# Patient Record
Sex: Female | Born: 1955
Health system: Southern US, Community
[De-identification: ages and names within clinical notes are randomized; demographics above are authoritative.]

## PROBLEM LIST (undated history)

## (undated) DIAGNOSIS — J189 Pneumonia, unspecified organism: Secondary | ICD-10-CM

## (undated) DIAGNOSIS — A310 Pulmonary mycobacterial infection: Secondary | ICD-10-CM

## (undated) DIAGNOSIS — R0600 Dyspnea, unspecified: Secondary | ICD-10-CM

## (undated) DIAGNOSIS — C3490 Malignant neoplasm of unspecified part of unspecified bronchus or lung: Secondary | ICD-10-CM

## (undated) DIAGNOSIS — C3431 Malignant neoplasm of lower lobe, right bronchus or lung: Secondary | ICD-10-CM

## (undated) DIAGNOSIS — J449 Chronic obstructive pulmonary disease, unspecified: Secondary | ICD-10-CM

## (undated) HISTORY — DX: Malignant neoplasm of unspecified part of unspecified bronchus or lung: C34.90

## (undated) HISTORY — PX: ABDOMINAL HYSTERECTOMY: SHX81

## (undated) HISTORY — DX: Malignant neoplasm of lower lobe, right bronchus or lung: C34.31

## (undated) HISTORY — DX: Pulmonary mycobacterial infection: A31.0

---

## 1993-10-02 HISTORY — PX: ELBOW SURGERY: SHX618

## 2005-01-17 ENCOUNTER — Ambulatory Visit: Payer: Self-pay | Admitting: Family Medicine

## 2005-03-03 ENCOUNTER — Ambulatory Visit: Payer: Self-pay | Admitting: Family Medicine

## 2005-03-16 ENCOUNTER — Ambulatory Visit: Payer: Self-pay

## 2005-10-05 ENCOUNTER — Ambulatory Visit: Payer: Self-pay

## 2009-06-11 ENCOUNTER — Ambulatory Visit: Payer: Self-pay | Admitting: Family Medicine

## 2009-08-13 ENCOUNTER — Ambulatory Visit: Payer: Self-pay | Admitting: Gastroenterology

## 2009-09-08 ENCOUNTER — Ambulatory Visit: Payer: Self-pay | Admitting: Gastroenterology

## 2010-06-24 ENCOUNTER — Ambulatory Visit: Payer: Self-pay | Admitting: Family Medicine

## 2011-09-29 ENCOUNTER — Ambulatory Visit: Payer: Self-pay | Admitting: Family Medicine

## 2012-11-22 ENCOUNTER — Ambulatory Visit: Payer: Self-pay | Admitting: Gastroenterology

## 2012-11-25 LAB — PATHOLOGY REPORT

## 2013-01-07 ENCOUNTER — Ambulatory Visit: Payer: Self-pay | Admitting: Family Medicine

## 2013-07-12 ENCOUNTER — Ambulatory Visit: Payer: Self-pay | Admitting: Unknown Physician Specialty

## 2014-05-05 ENCOUNTER — Ambulatory Visit: Payer: Self-pay | Admitting: Family Medicine

## 2014-07-13 DIAGNOSIS — G5600 Carpal tunnel syndrome, unspecified upper limb: Secondary | ICD-10-CM | POA: Insufficient documentation

## 2014-07-13 DIAGNOSIS — G5602 Carpal tunnel syndrome, left upper limb: Secondary | ICD-10-CM | POA: Insufficient documentation

## 2014-07-13 DIAGNOSIS — M4802 Spinal stenosis, cervical region: Secondary | ICD-10-CM | POA: Insufficient documentation

## 2014-07-13 DIAGNOSIS — M5412 Radiculopathy, cervical region: Secondary | ICD-10-CM | POA: Insufficient documentation

## 2015-03-15 ENCOUNTER — Other Ambulatory Visit: Payer: Self-pay | Admitting: Adult Health

## 2015-03-15 DIAGNOSIS — M79605 Pain in left leg: Secondary | ICD-10-CM

## 2015-03-15 DIAGNOSIS — M7989 Other specified soft tissue disorders: Secondary | ICD-10-CM

## 2015-03-18 ENCOUNTER — Ambulatory Visit
Admission: RE | Admit: 2015-03-18 | Discharge: 2015-03-18 | Disposition: A | Discharge status: Home / Self Care | Payer: Managed Care, Other (non HMO) | Source: Ambulatory Visit | Attending: Family Medicine | Admitting: Family Medicine

## 2015-03-18 DIAGNOSIS — M25572 Pain in left ankle and joints of left foot: Secondary | ICD-10-CM | POA: Diagnosis not present

## 2015-03-18 DIAGNOSIS — M79605 Pain in left leg: Secondary | ICD-10-CM

## 2015-03-18 DIAGNOSIS — M25472 Effusion, left ankle: Secondary | ICD-10-CM | POA: Diagnosis not present

## 2015-03-18 DIAGNOSIS — M7989 Other specified soft tissue disorders: Secondary | ICD-10-CM

## 2015-04-02 DIAGNOSIS — J189 Pneumonia, unspecified organism: Secondary | ICD-10-CM

## 2015-04-02 HISTORY — DX: Pneumonia, unspecified organism: J18.9

## 2015-04-22 ENCOUNTER — Other Ambulatory Visit: Payer: Self-pay | Admitting: Family Medicine

## 2015-04-22 DIAGNOSIS — R222 Localized swelling, mass and lump, trunk: Secondary | ICD-10-CM

## 2015-04-26 ENCOUNTER — Ambulatory Visit
Admission: RE | Admit: 2015-04-26 | Discharge: 2015-04-26 | Disposition: A | Discharge status: Home / Self Care | Payer: Managed Care, Other (non HMO) | Source: Ambulatory Visit | Attending: Family Medicine | Admitting: Family Medicine

## 2015-04-26 DIAGNOSIS — R222 Localized swelling, mass and lump, trunk: Secondary | ICD-10-CM

## 2015-04-26 DIAGNOSIS — I2510 Atherosclerotic heart disease of native coronary artery without angina pectoris: Secondary | ICD-10-CM | POA: Insufficient documentation

## 2015-04-26 DIAGNOSIS — R918 Other nonspecific abnormal finding of lung field: Secondary | ICD-10-CM | POA: Insufficient documentation

## 2015-04-26 MED ORDER — IOHEXOL 300 MG/ML  SOLN
75.0000 mL | Freq: Once | INTRAMUSCULAR | Status: AC | PRN
  Administered 2015-04-26: 75 mL via INTRAVENOUS

## 2015-04-28 ENCOUNTER — Encounter: Payer: Self-pay | Admitting: *Deleted

## 2015-04-28 ENCOUNTER — Inpatient Hospital Stay: Payer: Managed Care, Other (non HMO) | Attending: Oncology | Admitting: Oncology

## 2015-04-28 ENCOUNTER — Encounter: Payer: Self-pay | Admitting: Oncology

## 2015-04-28 ENCOUNTER — Encounter (INDEPENDENT_AMBULATORY_CARE_PROVIDER_SITE_OTHER): Payer: Self-pay

## 2015-04-28 ENCOUNTER — Ambulatory Visit: Payer: Managed Care, Other (non HMO)

## 2015-04-28 VITALS — BP 136/86 | HR 84 | Temp 96.8°F | Wt 107.8 lb

## 2015-04-28 DIAGNOSIS — I2510 Atherosclerotic heart disease of native coronary artery without angina pectoris: Secondary | ICD-10-CM

## 2015-04-28 DIAGNOSIS — Z79899 Other long term (current) drug therapy: Secondary | ICD-10-CM | POA: Diagnosis not present

## 2015-04-28 DIAGNOSIS — R918 Other nonspecific abnormal finding of lung field: Secondary | ICD-10-CM | POA: Diagnosis not present

## 2015-04-28 DIAGNOSIS — R05 Cough: Secondary | ICD-10-CM | POA: Diagnosis not present

## 2015-04-28 DIAGNOSIS — M810 Age-related osteoporosis without current pathological fracture: Secondary | ICD-10-CM | POA: Insufficient documentation

## 2015-04-28 DIAGNOSIS — F4321 Adjustment disorder with depressed mood: Secondary | ICD-10-CM | POA: Insufficient documentation

## 2015-04-28 DIAGNOSIS — M25472 Effusion, left ankle: Secondary | ICD-10-CM | POA: Diagnosis not present

## 2015-04-28 DIAGNOSIS — N3281 Overactive bladder: Secondary | ICD-10-CM | POA: Insufficient documentation

## 2015-04-28 DIAGNOSIS — F17210 Nicotine dependence, cigarettes, uncomplicated: Secondary | ICD-10-CM | POA: Diagnosis not present

## 2015-04-28 DIAGNOSIS — Z90710 Acquired absence of both cervix and uterus: Secondary | ICD-10-CM

## 2015-04-28 DIAGNOSIS — J42 Unspecified chronic bronchitis: Secondary | ICD-10-CM | POA: Insufficient documentation

## 2015-04-28 LAB — CBC WITH DIFFERENTIAL/PLATELET
BASOS ABS: 0.1 10*3/uL (ref 0–0.1)
Basophils Relative: 1 %
EOS ABS: 0.1 10*3/uL (ref 0–0.7)
Eosinophils Relative: 2 %
HCT: 38.5 % (ref 35.0–47.0)
Hemoglobin: 12.8 g/dL (ref 12.0–16.0)
LYMPHS ABS: 1.6 10*3/uL (ref 1.0–3.6)
LYMPHS PCT: 20 %
MCH: 33.4 pg (ref 26.0–34.0)
MCHC: 33.2 g/dL (ref 32.0–36.0)
MCV: 100.5 fL — ABNORMAL HIGH (ref 80.0–100.0)
MONO ABS: 0.8 10*3/uL (ref 0.2–0.9)
MONOS PCT: 10 %
Neutro Abs: 5.4 10*3/uL (ref 1.4–6.5)
Neutrophils Relative %: 67 %
Platelets: 446 10*3/uL — ABNORMAL HIGH (ref 150–440)
RBC: 3.83 MIL/uL (ref 3.80–5.20)
RDW: 14.5 % (ref 11.5–14.5)
WBC: 8 10*3/uL (ref 3.6–11.0)

## 2015-04-28 LAB — COMPREHENSIVE METABOLIC PANEL
ALBUMIN: 3.5 g/dL (ref 3.5–5.0)
ALT: 13 U/L — ABNORMAL LOW (ref 14–54)
ANION GAP: 6 (ref 5–15)
AST: 17 U/L (ref 15–41)
Alkaline Phosphatase: 180 U/L — ABNORMAL HIGH (ref 38–126)
BUN: 11 mg/dL (ref 6–20)
CHLORIDE: 101 mmol/L (ref 101–111)
CO2: 26 mmol/L (ref 22–32)
CREATININE: 0.59 mg/dL (ref 0.44–1.00)
Calcium: 8.4 mg/dL — ABNORMAL LOW (ref 8.9–10.3)
GFR calc non Af Amer: >60 mL/min (ref >60)
Glucose, Bld: 103 mg/dL — ABNORMAL HIGH (ref 65–99)
Potassium: 3.6 mmol/L (ref 3.5–5.1)
Sodium: 133 mmol/L — ABNORMAL LOW (ref 135–145)
Total Bilirubin: 0.6 mg/dL (ref 0.3–1.2)
Total Protein: 7.1 g/dL (ref 6.5–8.1)

## 2015-04-28 LAB — PROTIME-INR
INR: 0.96
Prothrombin Time: 13 seconds (ref 11.4–15.0)

## 2015-04-28 LAB — APTT: APTT: 25 s (ref 24–36)

## 2015-04-28 NOTE — Progress Notes (Signed)
Patient does have living will.  Currently smokes.

## 2015-04-28 NOTE — Progress Notes (Signed)
Met with patient at initial medical oncology appointment. Introduced Programmer, multimedia and reviewed plan of care. Will follow.

## 2015-04-28 NOTE — Progress Notes (Signed)
[place] @ [facility] [phone]  Fax:([phone]) [place]: [birth year]  MR#: [id number]  HBZ#:[id number]  Patient Care Team: Maryland Pink, MD as PCP - General (Family Medicine)  CHIEF COMPLAINT:  Chief Complaint  Patient presents with  . Follow-up    VISIT DIAGNOSIS:     ICD-9-CM ICD-10-CM   1. Lung mass 786.6 R91.8 mometasone-formoterol (DULERA) 200-5 MCG/ACT AERO     Vitamin D, Ergocalciferol, (DRISDOL) 50000 UNITS CAPS capsule     albuterol (PROAIR HFA) 108 (90 BASE) MCG/ACT inhaler     oxybutynin (DITROPAN-XL) 10 MG 24 hr tablet     buPROPion (BUDEPRION XL) 300 MG 24 hr tablet     Pulmonary function test     NM PET Image Initial (PI) Skull Base To Thigh     CBC with Differential     Comprehensive metabolic panel     APTT     Protime-INR     CT Biopsy     CBC with Differential     Comprehensive metabolic panel     APTT     Protime-INR      No history exists.    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  Started having cough and congestion.  Dry hacking cough.  No hemoptysis.  No chest pain.  Initial antibiotic therapy was done but hacking cough continues with chest x-ray was done which was abnormal and CT scan was done on light 25th which revealed 7.5 cm mass in the right lower lobe Patient was referred to me for further evaluation and treatment consideration.  REVIEW OF SYSTEMS:   GENERAL:  Feels good.  Active.  No fevers, sweats or weight loss. PERFORMANCE STATUS (ECOG): 0 HEENT:  No visual changes, runny nose, sore throat, mouth sores or tenderness. Lungs: Dry hacking cough.  No hemoptysis no chest pain patient is a chronic smoker Cardiac:  No chest pain, palpitations, orthopnea, or PND. GI:  No nausea, vomiting, diarrhea, constipation, melena or hematochezia. GU:  No urgency, frequency, dysuria, or  hematuria. Musculoskeletal:  No back pain.  No joint pain.  No muscle tenderness. Extremities:  No pain or swelling. Skin:  No rashes or skin changes. Neuro:  No headache, numbness or weakness, balance or coordination issues. Endocrine:  No diabetes, thyroid issues, hot flashes or night sweats. Psych:  No mood changes, depression or anxiety. Pain:  No focal pain. Review of systems:  All other systems reviewed and found to be negative.  As per HPI. Otherwise, a complete review of systems is negatve.  PAST MEDICAL HISTORY: No significant past medical history.  Denies any diabetes or hypertensio occasional joint pains recently had some swelling of the left ankle area ultrasound was negative for deep vein thrombosis PAST SURGICAL HISTORY: Patient had a hysterectomy.  No other significant past history FAMILY HISTORY There is no significant family history of breast cancer, ovarian cancer, colon cancer GYNECOLOGIC HISTORY:  No LMP recorded.     ADVANCED DIRECTIVES:    HEALTH MAINTENANCE: History  Substance Use Topics  . Smoking status: Current Every Day Smoker  . Smokeless tobacco: Not on file  . Alcohol Use: Not on file      No Known Allergies  Current Outpatient Prescriptions  Medication Sig Dispense Refill  . albuterol (PROAIR HFA) 108 (90 BASE) MCG/ACT inhaler Inhale into the lungs.    Marland Kitchen buPROPion (BUDEPRION XL) 300 MG  24 hr tablet Take by mouth.    . mometasone-formoterol (DULERA) 200-5 MCG/ACT AERO Use 2 puffs two times daily    . oxybutynin (DITROPAN-XL) 10 MG 24 hr tablet Take 1 tablet by mouth once daily    . Vitamin D, Ergocalciferol, (DRISDOL) 50000 UNITS CAPS capsule Take by mouth.     No current facility-administered medications for this visit.    OBJECTIVE: PHYSICAL EXAM: GENERAL:  Well developed, well nourished, sitting comfortably in the exam room in no acute distress. MENTAL STATUS:  Alert and oriented to person, place and time.  ENT:  Oropharynx clear  without lesion.  Tongue normal. Mucous membranes moist.  RESPIRATORY:  Clear to auscultation without rales, wheezes or rhonchi. CARDIOVASCULAR:  Regular rate and rhythm without murmur, rub or gallop. BREAST:  Right breast without masses, skin changes or nipple discharge.  Left breast without masses, skin changes or nipple discharge. ABDOMEN:  Soft, non-tender, with active bowel sounds, and no hepatosplenomegaly.  No masses. BACK:  No CVA tenderness.  No tenderness on percussion of the back or rib cage. SKIN:  No rashes, ulcers or lesions. EXTREMITIES: No edema, no skin discoloration or tenderness.  No palpable cords. LYMPH NODES: No palpable cervical, supraclavicular, axillary or inguinal adenopathy  NEUROLOGICAL: Unremarkable. PSYCH:  Appropriate.  Filed Vitals:   04/28/15 0834  BP: 136/86  Pulse: 84  Temp: 96.8 F (36 C)     There is no height on file to calculate BMI.    ECOG FS:0 - Asymptomatic  LAB RESULTS:  Office Visit on 04/28/2015  Component Date Value Ref Range Status  . WBC 04/28/2015 8.0  3.6 - 11.0 K/uL Final  . RBC 04/28/2015 3.83  3.80 - 5.20 MIL/uL Final  . Hemoglobin 04/28/2015 12.8  12.0 - 16.0 g/dL Final  . HCT 04/28/2015 38.5  35.0 - 47.0 % Final  . MCV 04/28/2015 100.5* 80.0 - 100.0 fL Final  . MCH 04/28/2015 33.4  26.0 - 34.0 pg Final  . MCHC 04/28/2015 33.2  32.0 - 36.0 g/dL Final  . RDW 04/28/2015 14.5  11.5 - 14.5 % Final  . Platelets 04/28/2015 446* 150 - 440 K/uL Final  . Neutrophils Relative % 04/28/2015 67   Final  . Neutro Abs 04/28/2015 5.4  1.4 - 6.5 K/uL Final  . Lymphocytes Relative 04/28/2015 20   Final  . Lymphs Abs 04/28/2015 1.6  1.0 - 3.6 K/uL Final  . Monocytes Relative 04/28/2015 10   Final  . Monocytes Absolute 04/28/2015 0.8  0.2 - 0.9 K/uL Final  . Eosinophils Relative 04/28/2015 2   Final  . Eosinophils Absolute 04/28/2015 0.1  0 - 0.7 K/uL Final  . Basophils Relative 04/28/2015 1   Final  . Basophils Absolute 04/28/2015 0.1  0 -  0.1 K/uL Final  . Sodium 04/28/2015 133* 135 - 145 mmol/L Final  . Potassium 04/28/2015 3.6  3.5 - 5.1 mmol/L Final  . Chloride 04/28/2015 101  101 - 111 mmol/L Final  . CO2 04/28/2015 26  22 - 32 mmol/L Final  . Glucose, Bld 04/28/2015 103* 65 - 99 mg/dL Final  . BUN 04/28/2015 11  6 - 20 mg/dL Final  . Creatinine, Ser 04/28/2015 0.59  0.44 - 1.00 mg/dL Final  . Calcium 04/28/2015 8.4* 8.9 - 10.3 mg/dL Final  . Total Protein 04/28/2015 7.1  6.5 - 8.1 g/dL Final  . Albumin 04/28/2015 3.5  3.5 - 5.0 g/dL Final  . AST 04/28/2015 17  15 - 41 U/L Final  .  ALT 04/28/2015 13* 14 - 54 U/L Final  . Alkaline Phosphatase 04/28/2015 180* 38 - 126 U/L Final  . Total Bilirubin 04/28/2015 0.6  0.3 - 1.2 mg/dL Final  . GFR calc non Af Amer 04/28/2015 >60  >60 mL/min Final  . GFR calc Af Amer 04/28/2015 >60  >60 mL/min Final   Comment: (NOTE) The eGFR has been calculated using the CKD EPI equation. This calculation has not been validated in all clinical situations. eGFR's persistently <60 mL/min signify possible Chronic Kidney Disease.   . Anion gap 04/28/2015 6  5 - 15 Final  . aPTT 04/28/2015 25  24 - 36 seconds Final  . Prothrombin Time 04/28/2015 13.0  11.4 - 15.0 seconds Final  . INR 04/28/2015 0.96   Final     STUDIES: Ct Chest W Contrast  04/26/2015   CLINICAL DATA:  Right lower lobe mass on previous chest x-ray. Worsening cough and congestion for 2-3 months. Smoker.  EXAM: CT CHEST WITH CONTRAST  TECHNIQUE: Multidetector CT imaging of the chest was performed during intravenous contrast administration.  CONTRAST:  74m OMNIPAQUE IOHEXOL 300 MG/ML  SOLN  COMPARISON:  None available.  FINDINGS: There is a large mass within the right lower lobe measuring up to 7.5 cm in greatest diameter. The mass appears necrotic. This is concerning for primary lung cancer. No associated hilar, mediastinal or axillary adenopathy. No associated pleural effusion. Left lung is clear.  Heart is normal size. Aorta is  normal caliber. Calcifications within the left anterior descending coronary artery. Chest wall soft tissues are unremarkable. Imaging into the upper abdomen shows no acute findings.  No acute bony abnormality or focal bone lesion.  IMPRESSION: 7.5 cm necrotic mass within the right lower lobe concerning for primary lung cancer.  Coronary artery disease.  These results will be called to the ordering clinician or representative by the Radiologist Assistant, and communication documented in the PACS or zVision Dashboard.   Electronically Signed   By: KRolm BaptiseM.D.   On: 04/26/2015 16:14    ASSESSMENT:  CT scan has been reviewed independently.  The right lower lobe mass Clinically suggestive of cancer mass is 7.5 cm I reviewed the CT scan with patient.  PLAN:   UKoreacussed with interventional radiation physician they want PET scan done prior to biopsy Will get a lung functions done Surgical evaluation by Dr. OFaith RogueDiscuss case in tumor conference    Patient expressed understanding and was in agreement with this plan. She also understands that She can call clinic at any time with any questions, concerns, or complaints.    No matching staging information was found for the patient.  JForest Gleason MD   04/28/2015 12:56 PM      [email or web address]:([phone] [phone]

## 2015-04-29 ENCOUNTER — Ambulatory Visit: Payer: Managed Care, Other (non HMO) | Attending: Oncology

## 2015-04-29 ENCOUNTER — Other Ambulatory Visit: Payer: Self-pay | Admitting: Oncology

## 2015-04-29 DIAGNOSIS — R918 Other nonspecific abnormal finding of lung field: Secondary | ICD-10-CM | POA: Insufficient documentation

## 2015-04-29 MED ORDER — ALBUTEROL SULFATE (2.5 MG/3ML) 0.083% IN NEBU
2.5000 mg | INHALATION_SOLUTION | Freq: Once | RESPIRATORY_TRACT | Status: AC
  Administered 2015-04-29: 2.5 mg via RESPIRATORY_TRACT

## 2015-05-03 ENCOUNTER — Other Ambulatory Visit: Payer: Self-pay | Admitting: Radiology

## 2015-05-03 ENCOUNTER — Ambulatory Visit
Admission: RE | Admit: 2015-05-03 | Discharge: 2015-05-03 | Disposition: A | Discharge status: Home / Self Care | Payer: Managed Care, Other (non HMO) | Source: Ambulatory Visit | Attending: Oncology | Admitting: Oncology

## 2015-05-03 DIAGNOSIS — C3491 Malignant neoplasm of unspecified part of right bronchus or lung: Secondary | ICD-10-CM | POA: Insufficient documentation

## 2015-05-03 DIAGNOSIS — C799 Secondary malignant neoplasm of unspecified site: Secondary | ICD-10-CM | POA: Diagnosis present

## 2015-05-03 DIAGNOSIS — Z801 Family history of malignant neoplasm of trachea, bronchus and lung: Secondary | ICD-10-CM | POA: Insufficient documentation

## 2015-05-03 DIAGNOSIS — R918 Other nonspecific abnormal finding of lung field: Secondary | ICD-10-CM

## 2015-05-03 DIAGNOSIS — M7989 Other specified soft tissue disorders: Secondary | ICD-10-CM | POA: Diagnosis not present

## 2015-05-03 LAB — GLUCOSE, CAPILLARY: GLUCOSE-CAPILLARY: 88 mg/dL (ref 65–99)

## 2015-05-03 MED ORDER — FLUDEOXYGLUCOSE F - 18 (FDG) INJECTION
11.6600 | Freq: Once | INTRAVENOUS | Status: AC | PRN
  Administered 2015-05-03: 11.66 via INTRAVENOUS

## 2015-05-04 ENCOUNTER — Ambulatory Visit
Admission: RE | Admit: 2015-05-04 | Discharge: 2015-05-04 | Disposition: A | Discharge status: Home / Self Care | Payer: Managed Care, Other (non HMO) | Source: Ambulatory Visit | Attending: Oncology | Admitting: Oncology

## 2015-05-04 ENCOUNTER — Ambulatory Visit
Admission: RE | Admit: 2015-05-04 | Discharge: 2015-05-04 | Disposition: A | Discharge status: Home / Self Care | Payer: Managed Care, Other (non HMO) | Source: Ambulatory Visit | Attending: Interventional Radiology | Admitting: Interventional Radiology

## 2015-05-04 DIAGNOSIS — M7989 Other specified soft tissue disorders: Secondary | ICD-10-CM | POA: Diagnosis not present

## 2015-05-04 DIAGNOSIS — R918 Other nonspecific abnormal finding of lung field: Secondary | ICD-10-CM

## 2015-05-04 DIAGNOSIS — Z9889 Other specified postprocedural states: Secondary | ICD-10-CM

## 2015-05-04 HISTORY — DX: Chronic obstructive pulmonary disease, unspecified: J44.9

## 2015-05-04 HISTORY — DX: Pneumonia, unspecified organism: J18.9

## 2015-05-04 MED ORDER — SODIUM CHLORIDE 0.9 % IV SOLN
INTRAVENOUS | Status: DC
  Administered 2015-05-04: 09:00:00 via INTRAVENOUS

## 2015-05-04 MED ORDER — MIDAZOLAM HCL 5 MG/5ML IJ SOLN
INTRAMUSCULAR | Status: AC | PRN
  Administered 2015-05-04 (×2): 1 mg via INTRAVENOUS

## 2015-05-04 MED ORDER — FENTANYL CITRATE (PF) 100 MCG/2ML IJ SOLN
INTRAMUSCULAR | Status: AC | PRN
  Administered 2015-05-04: 25 ug via INTRAVENOUS
  Administered 2015-05-04: 50 ug via INTRAVENOUS

## 2015-05-04 MED ORDER — HYDROCODONE-ACETAMINOPHEN 5-325 MG PO TABS
1.0000 | ORAL_TABLET | Freq: Once | ORAL | Status: AC
  Administered 2015-05-04: 1 via ORAL

## 2015-05-04 NOTE — OR Nursing (Signed)
Patient c/o 8/10 pain in posterior chest on right side, upper, MD made aware by Curly Shores, RN , new order given for Norcor 5/325 and may repeat, medication given at 11:30

## 2015-05-04 NOTE — Consult Note (Signed)
Chief Complaint: Patient was seen in consultation today for No chief complaint on file.  at the request of Elida  Referring Physician(s): Choksi,Janak  History of Present Illness: Claudia Chavez is a 59 y.o. female who presents with a cough and a RLL lung mass. There were positive mediastinal nodes. It is presumed IIIa disease. She denies fever or chills but still has a cough.  Past Medical History  Diagnosis Date  . COPD (chronic obstructive pulmonary disease)   . Pneumonia     Past Surgical History  Procedure Laterality Date  . Abdominal hysterectomy      Allergies: Review of patient's allergies indicates no known allergies.  Medications: Prior to Admission medications   Medication Sig Start Date End Date Taking? Authorizing Provider  albuterol (PROAIR HFA) 108 (90 BASE) MCG/ACT inhaler Inhale into the lungs. 01/27/15  Yes Historical Provider, MD  mometasone-formoterol (DULERA) 200-5 MCG/ACT AERO Use 2 puffs two times daily 01/06/15  Yes Historical Provider, MD  oxybutynin (DITROPAN-XL) 10 MG 24 hr tablet Take 1 tablet by mouth once daily 01/06/15  Yes Historical Provider, MD  Vitamin D, Ergocalciferol, (DRISDOL) 50000 UNITS CAPS capsule Take by mouth.   Yes Historical Provider, MD     History reviewed. No pertinent family history.  History   Social History  . Marital Status: Married    Spouse Name: N/A  . Number of Children: N/A  . Years of Education: N/A   Social History Main Topics  . Smoking status: Current Every Day Smoker -- 1.00 packs/day  . Smokeless tobacco: Not on file  . Alcohol Use: Yes     Comment: beer-occasionally  . Drug Use: Not on file  . Sexual Activity: Not on file   Other Topics Concern  . None   Social History Narrative      Review of Systems: A 12 point ROS discussed and pertinent positives are indicated in the HPI above.  All other systems are negative.  Review of Systems  Vital Signs: BP 129/86 mmHg  Pulse 88   Temp(Src) 98.4 F (36.9 C) (Oral)  Resp 18  Ht '5\' 1"'$  (1.549 m)  Wt 107 lb (48.535 kg)  BMI 20.23 kg/m2  SpO2 100%  Physical Exam  Constitutional: She is oriented to person, place, and time. She appears well-developed and well-nourished.  Cardiovascular: Normal rate and regular rhythm.   Pulmonary/Chest: Effort normal and breath sounds normal.  Neurological: She is alert and oriented to person, place, and time.  Skin: Skin is warm and dry.    Mallampati Score:     Imaging: Ct Chest W Contrast  04/26/2015   CLINICAL DATA:  Right lower lobe mass on previous chest x-ray. Worsening cough and congestion for 2-3 months. Smoker.  EXAM: CT CHEST WITH CONTRAST  TECHNIQUE: Multidetector CT imaging of the chest was performed during intravenous contrast administration.  CONTRAST:  33m OMNIPAQUE IOHEXOL 300 MG/ML  SOLN  COMPARISON:  None available.  FINDINGS: There is a large mass within the right lower lobe measuring up to 7.5 cm in greatest diameter. The mass appears necrotic. This is concerning for primary lung cancer. No associated hilar, mediastinal or axillary adenopathy. No associated pleural effusion. Left lung is clear.  Heart is normal size. Aorta is normal caliber. Calcifications within the left anterior descending coronary artery. Chest wall soft tissues are unremarkable. Imaging into the upper abdomen shows no acute findings.  No acute bony abnormality or focal bone lesion.  IMPRESSION: 7.5 cm necrotic mass  within the right lower lobe concerning for primary lung cancer.  Coronary artery disease.  These results will be called to the ordering clinician or representative by the Radiologist Assistant, and communication documented in the PACS or zVision Dashboard.   Electronically Signed   By: Rolm Baptise M.D.   On: 04/26/2015 16:14   Nm Pet Image Initial (pi) Skull Base To Thigh  05/03/2015   CLINICAL DATA:  Initial treatment strategy for Lung cancer.  EXAM: NUCLEAR MEDICINE PET SKULL BASE TO  THIGH  TECHNIQUE: 11.7 mCi F-18 FDG was injected intravenously. Full-ring PET imaging was performed from the skull base to thigh after the radiotracer. CT data was obtained and used for attenuation correction and anatomic localization.  FASTING BLOOD GLUCOSE:  Value: 88 mg/dl  COMPARISON:  04/26/2015  FINDINGS: NECK  No hypermetabolic lymph nodes in the neck.  CHEST  Necrotic mass in the right lower lobe measures 7.5 by 7.4 by 9.0 cm. The SUV max associated with this mass is equal to 12.1. Adjacent area of subsegmental atelectasis is noted within the posterior right lung base. Likely postobstructive. No additional hypermetabolic pulmonary nodules or mass is identified. The tumor extends and abuts the visceral pleural. There is increased uptake associated with the right hilar, sub- carinal and right paratracheal lymph nodes. SUV max associated with the right paratracheal lymph node is equal to 2.7. SUV max associated with the right hilar lymph node is equal to 3.2 an the FDG uptake associated with the sub- carinal node is equal to 2.68.  Heart size is normal. There is no pericardial effusion. Aortic atherosclerosis noted. There are calcifications within the LAD coronary artery.  ABDOMEN/PELVIS  No abnormal hypermetabolic activity within the liver, pancreas, adrenal glands, or spleen. Aortic atherosclerosis noted. No hypermetabolic lymph nodes in the abdomen or pelvis.  SKELETON  No focal hypermetabolic activity to suggest skeletal metastasis.  IMPRESSION: 1. Large right lower lobe lung mass is intensely hypermetabolic compatible with primary bronchogenic carcinoma. There is associated increased uptake within the right hilar, sub- carinal and right paratracheal region. 2. Assuming non-small cell histology findings are suggestive of T3N2M0 disease or stage IIIa disease. Recommend correlation with tissue sampling including bronchoscopy with right paratracheal or sub- carinal lymph node sampling. 3. Three aortic  atherosclerosis   Electronically Signed   By: Kerby Moors M.D.   On: 05/03/2015 14:47    Labs:  CBC:  Recent Labs  04/28/15 0933  WBC 8.0  HGB 12.8  HCT 38.5  PLT 446*    COAGS:  Recent Labs  04/28/15 0933  INR 0.96  APTT 25    BMP:  Recent Labs  04/28/15 0933  NA 133*  K 3.6  CL 101  CO2 26  GLUCOSE 103*  BUN 11  CALCIUM 8.4*  CREATININE 0.59  GFRNONAA >60  GFRAA >60    LIVER FUNCTION TESTS:  Recent Labs  04/28/15 0933  BILITOT 0.6  AST 17  ALT 13*  ALKPHOS 180*  PROT 7.1  ALBUMIN 3.5    TUMOR MARKERS: No results for input(s): AFPTM, CEA, CA199, CHROMGRNA in the last 8760 hours.  Assessment and Plan:  RLL lung mas, for lung biopsy.  Thank you for this interesting consult.  I greatly enjoyed meeting TANEKA ESPIRITU and look forward to participating in their care.  A copy of this report was sent to the requesting provider on this date.  Signed: Haidynn Almendarez, ART A 05/04/2015, 9:08 AM   I spent a total of  30  Minutes   in face to face in clinical consultation, greater than 50% of which was counseling/coordinating care for lung biopsy.

## 2015-05-04 NOTE — Procedures (Signed)
RLL lung mass Bx 18 g core times three No comp/EBL

## 2015-05-05 LAB — SURGICAL PATHOLOGY

## 2015-05-06 ENCOUNTER — Ambulatory Visit
Admission: RE | Admit: 2015-05-06 | Discharge: 2015-05-06 | Disposition: A | Discharge status: Home / Self Care | Payer: Managed Care, Other (non HMO) | Source: Ambulatory Visit | Attending: Cardiothoracic Surgery | Admitting: Cardiothoracic Surgery

## 2015-05-06 ENCOUNTER — Other Ambulatory Visit: Payer: Self-pay | Admitting: Cardiothoracic Surgery

## 2015-05-06 ENCOUNTER — Encounter: Payer: Self-pay | Admitting: Cardiothoracic Surgery

## 2015-05-06 ENCOUNTER — Inpatient Hospital Stay (HOSPITAL_BASED_OUTPATIENT_CLINIC_OR_DEPARTMENT_OTHER): Payer: Managed Care, Other (non HMO) | Admitting: Oncology

## 2015-05-06 ENCOUNTER — Other Ambulatory Visit: Payer: Self-pay | Admitting: *Deleted

## 2015-05-06 ENCOUNTER — Inpatient Hospital Stay: Payer: Managed Care, Other (non HMO) | Attending: Cardiothoracic Surgery | Admitting: Cardiothoracic Surgery

## 2015-05-06 DIAGNOSIS — C3491 Malignant neoplasm of unspecified part of right bronchus or lung: Secondary | ICD-10-CM | POA: Diagnosis not present

## 2015-05-06 DIAGNOSIS — Z90710 Acquired absence of both cervix and uterus: Secondary | ICD-10-CM

## 2015-05-06 DIAGNOSIS — Z79899 Other long term (current) drug therapy: Secondary | ICD-10-CM

## 2015-05-06 DIAGNOSIS — M818 Other osteoporosis without current pathological fracture: Secondary | ICD-10-CM | POA: Diagnosis not present

## 2015-05-06 DIAGNOSIS — I2510 Atherosclerotic heart disease of native coronary artery without angina pectoris: Secondary | ICD-10-CM

## 2015-05-06 DIAGNOSIS — C3431 Malignant neoplasm of lower lobe, right bronchus or lung: Secondary | ICD-10-CM | POA: Diagnosis present

## 2015-05-06 DIAGNOSIS — M810 Age-related osteoporosis without current pathological fracture: Secondary | ICD-10-CM

## 2015-05-06 DIAGNOSIS — C799 Secondary malignant neoplasm of unspecified site: Secondary | ICD-10-CM

## 2015-05-06 DIAGNOSIS — M7989 Other specified soft tissue disorders: Secondary | ICD-10-CM

## 2015-05-06 DIAGNOSIS — R599 Enlarged lymph nodes, unspecified: Secondary | ICD-10-CM | POA: Diagnosis not present

## 2015-05-06 DIAGNOSIS — Z5111 Encounter for antineoplastic chemotherapy: Secondary | ICD-10-CM | POA: Insufficient documentation

## 2015-05-06 DIAGNOSIS — R05 Cough: Secondary | ICD-10-CM | POA: Diagnosis not present

## 2015-05-06 DIAGNOSIS — R609 Edema, unspecified: Secondary | ICD-10-CM | POA: Diagnosis not present

## 2015-05-06 NOTE — Progress Notes (Signed)
Patient ID: Claudia Chavez, female   DOB: 19-Dec-1955, 59 y.o.   MRN: [id number]  Chief Complaint  Patient presents with  . New Evaluation    see Dr. Genevive Bi in evaluation for lung biopsy  . Follow-up    with Dr. Oliva Bustard    Referred By Dr. Ramond Craver he Reason for Referral right lower lobe mass  HPI Location, Quality, Duration, Severity, Timing, Context, Modifying Factors, Associated Signs and Symptoms.  Claudia Chavez is a 59 y.o. female.  I have personally seen and examined this patient. She is a 58 year old female referred to the [facility] by Dr. Maryland Pink. Her problems began about 3 months ago when she states experienced a cough and a feeling of bronchitis. She was treated with antibiotics and failed to improve. Several weeks later she was treated with another round of antibiotics as well as steroids and a chest x-ray was performed. The chest x-ray revealed a large mass in her right lower lobe. She had a CT scan performed and was seen by Dr. Ramond Craver in our cancer center. He performed a PET scan which revealed a large right lower lobe mass and multiple hilar and mediastinal lymph nodes that were PET positive. The patient states that her cough has improved somewhat but still persists to some degree. She has not had any frank hemoptysis. She occasionally bring up some clear or yellow sputum. She's had no weight loss. She did have some swelling in her lower extremities several weeks ago which has persisted. This is not relieved with elevation. She had an ultrasound done of her ankle which reveal possible Tina synovitis. As best I can tell they did not evaluate her deep venous system. The patient has no bone pain or any symptoms to suggest CNS involvement.   Past Medical History  Diagnosis Date  . COPD (chronic obstructive pulmonary disease)   . Pneumonia   . Non-small cell lung cancer     Past Surgical History  Procedure Laterality Date  . Abdominal hysterectomy      Family History   Problem Relation Age of Onset  . Lung cancer Father 62    Social History History  Substance Use Topics  . Smoking status: Current Every Day Smoker -- 1.00 packs/day for 40 years    Types: Cigarettes  . Smokeless tobacco: Not on file     Comment: "1 pk or less per day"  . Alcohol Use: 0.0 oz/week    0 Standard drinks or equivalent per week     Comment: beer-occasionally    No Known Allergies  Current Outpatient Prescriptions  Medication Sig Dispense Refill  . albuterol (PROAIR HFA) 108 (90 BASE) MCG/ACT inhaler Inhale into the lungs.    . mometasone-formoterol (DULERA) 200-5 MCG/ACT AERO Use 2 puffs two times daily    . oxybutynin (DITROPAN-XL) 10 MG 24 hr tablet Take 1 tablet by mouth once daily    . Vitamin D, Ergocalciferol, (DRISDOL) 50000 UNITS CAPS capsule Take by mouth.     No current facility-administered medications for this visit.      Review of Systems A complete review of systems was asked and was negative except for the following positive findings swelling of her lower extremities and cough  There were no vitals taken for this visit.  Physical Exam CONSTITUTIONAL:  Pleasant, well-developed, well-nourished, and in no acute distress. EYES: Pupils equal and reactive to light, Sclera non-icteric EARS, NOSE, MOUTH AND THROAT:  The oropharynx was clear.  Dentition is good repair.  Oral mucosa pink and moist.  Dental implants are present LYMPH NODES:  Lymph nodes in the neck and axillae were normal RESPIRATORY:  Lungs were clear.  Normal respiratory effort without pathologic use of accessory muscles of respiration CARDIOVASCULAR: Heart was regular without murmurs.  There were no carotid bruits.  There is 2+ edema of both lower extremities GI: The abdomen was soft, nontender, and nondistended. There were no palpable masses. There was no hepatosplenomegaly. There were normal bowel sounds in all quadrants. GU:  Rectal deferred.   MUSCULOSKELETAL:  Normal muscle  strength and tone.  No clubbing or cyanosis.   SKIN:  There were no pathologic skin lesions.  There were no nodules on palpation. NEUROLOGIC:  Sensation is normal.  Cranial nerves are grossly intact. PSYCH:  Oriented to person, place and time.  Mood and affect are normal.  Data Reviewed I have independently reviewed the patient's CT scan and PET scan  I have personally reviewed the patient's imaging, laboratory findings and medical records.    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 I had a long discussion with her regarding the options. I also discussed her care with Dr. Ramond Craver he. We believe that chemotherapy followed by restaging and perhaps surgery would be her best option. The location the tumor is against the major fissure and the right middle lobe. I explained to her the necessity for evaluating her lower extremities. I also reviewed with her the indications and risks of Port-A-Cath placement. Risks of bleeding infection and pneumothorax were all addressed. She would like to proceed.    Plan    After extensive discussion with the patient and her husband regarding the many options for management of her stage III carcinoma the lung we have elected to proceed on with Port-A-Cath placement followed by chemotherapy and possible surgery. We will obtain a ultrasound of her lower extremities. We will also obtain the pulmonary function studies performed last week. I will schedule her for surgery next week. I have reviewed her laboratory studies and all is in order.    Nestor Lewandowsky, MD 05/06/2015, 10:00 AM

## 2015-05-06 NOTE — Addendum Note (Signed)
Addended by: Renita Papa R on: 05/06/2015 11:01 AM   Modules accepted: Orders

## 2015-05-06 NOTE — Addendum Note (Signed)
Addended by: Nestor Lewandowsky E on: 05/06/2015 10:35 AM   Modules accepted: Orders

## 2015-05-07 ENCOUNTER — Telehealth: Payer: Self-pay | Admitting: Cardiothoracic Surgery

## 2015-05-07 ENCOUNTER — Encounter: Payer: Self-pay | Admitting: Oncology

## 2015-05-07 DIAGNOSIS — C3431 Malignant neoplasm of lower lobe, right bronchus or lung: Secondary | ICD-10-CM

## 2015-05-07 DIAGNOSIS — C3490 Malignant neoplasm of unspecified part of unspecified bronchus or lung: Secondary | ICD-10-CM | POA: Insufficient documentation

## 2015-05-07 HISTORY — DX: Malignant neoplasm of lower lobe, right bronchus or lung: C34.31

## 2015-05-07 NOTE — Telephone Encounter (Signed)
I have called Pt to advise of pre op date/time and sx date. No answer, i have left a message on voicemail.  Sx: 05/10/15 with Dr Rolley Sims Placement Pre op: pt to arrive @ 2 hours early prior to sx. Pt to arrive at 9:00 at pre admit. Pt will then go to SDS after pre admit appointment.

## 2015-05-07 NOTE — Progress Notes (Signed)
[place] @ [facility] Telephone:([phone]) [phone]  Fax:(336) [phone]   INITIAL CONSULT  Claudia Chavez OB: [birth year]  MR#: [id number]  PYP#:[id number]  Patient Care Team: [place] Pink, MD as PCP - General (Family Medicine)  CHIEF COMPLAINT:  1.  Squamous cell carcinoma of right  LOWER LOBE OF  lung stage IIIA based on PET scan Biopsy of the (August of 2016)   VISIT DIAGNOSIS:     ICD-9-CM ICD-10-CM   1. OP (osteoporosis) 733.00 M81.0 NM Bone Scan Whole Body    CANCER  of lung  No history exists.    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  Started having cough and congestion.  Dry hacking cough.  No hemoptysis.  No chest pain.  Initial antibiotic therapy was done but hacking cough continues with chest x-ray was done which was abnormal and CT scan was done on JULY  25th which revealed 7.5 cm mass in the right lower lobe Patient was referred to me for further evaluation and treatment consideration.  May 06, 2015 Patient is here for further follow-up regarding carcinoma of lung.  Biopsies consistent with squamous cell.  I discussed that with rate pathologist and reviewed the pathology slides. PET scan has been done which has been reviewed and shows mediastinal enlargement of lymph nodes. Patient is here to discuss the results and further options of therapy Dear Mr. dry hacking cough.  Family is present with patient today REVIEW OF SYSTEMS:   GENERAL:  Feels good.  Active.  No fevers, sweats or weight loss. PERFORMANCE STATUS (ECOG): 0 HEENT:  No visual changes, runny nose, sore throat, mouth sores or tenderness. Lungs: Dry hacking cough.  No hemoptysis no chest pain patient is a chronic smoker Cardiac:  No chest pain, palpitations, orthopnea, or PND. GI:  No nausea, vomiting, diarrhea, constipation, melena or hematochezia. GU:  No urgency, frequency, dysuria, or  hematuria. Musculoskeletal:  No back pain.  No joint pain.  No muscle tenderness. Extremities:  No pain or swelling. Skin:  No rashes or skin changes. Neuro:  No headache, numbness or weakness, balance or coordination issues. Endocrine:  No diabetes, thyroid issues, hot flashes or night sweats. Psych:  No mood changes, depression or anxiety. Pain:  No focal pain. Review of systems:  All other systems reviewed and found to be negative.  As per HPI. Otherwise, a complete review of systems is negatve.  PAST MEDICAL HISTORY: No significant past medical history.  Denies any diabetes or hypertensio occasional joint pains recently had some swelling of the left ankle area ultrasound was negative for deep vein thrombosis PAST SURGICAL HISTORY: Patient had a hysterectomy.  No other significant past history FAMILY HISTORY There is no significant family history of breast cancer, ovarian cancer, colon cancer GYNECOLOGIC HISTORY:  No LMP recorded. Patient has had a hysterectomy.     ADVANCED DIRECTIVES:    HEALTH MAINTENANCE: History  Substance Use Topics  . Smoking status: Current Every Day Smoker -- 1.00 packs/day for 40 years    Types: Cigarettes  . Smokeless tobacco: Not on file     Comment: "1 pk or less per day"  . Alcohol Use: 0.0 oz/week    0 Standard drinks or equivalent per week     Comment: beer-occasionally      No Known Allergies  Current Outpatient Prescriptions  Medication Sig Dispense Refill  . albuterol (PROAIR HFA) 108 (90 BASE) MCG/ACT inhaler Inhale into  the lungs.    . mometasone-formoterol (DULERA) 200-5 MCG/ACT AERO Use 2 puffs two times daily    . oxybutynin (DITROPAN-XL) 10 MG 24 hr tablet Take 1 tablet by mouth once daily    . Vitamin D, Ergocalciferol, (DRISDOL) 50000 UNITS CAPS capsule Take by mouth.     No current facility-administered medications for this visit.    OBJECTIVE: PHYSICAL EXAM: GENERAL:  Well developed, well nourished, sitting  comfortably in the exam room in no acute distress. MENTAL STATUS:  Alert and oriented to person, place and time.  ENT:  Oropharynx clear without lesion.  Tongue normal. Mucous membranes moist.  RESPIRATORY:  Clear to auscultation without rales, wheezes or rhonchi. CARDIOVASCULAR:  Regular rate and rhythm without murmur, rub or gallop. BREAST:  Right breast without masses, skin changes or nipple discharge.  Left breast without masses, skin changes or nipple discharge. ABDOMEN:  Soft, non-tender, with active bowel sounds, and no hepatosplenomegaly.  No masses. BACK:  No CVA tenderness.  No tenderness on percussion of the back or rib cage. SKIN:  No rashes, ulcers or lesions. EXTREMITIES: No edema, no skin discoloration or tenderness.  No palpable cords. LYMPH NODES: No palpable cervical, supraclavicular, axillary or inguinal adenopathy  NEUROLOGICAL: Unremarkable. PSYCH:  Appropriate.  There were no vitals filed for this visit.   There is no weight on file to calculate BMI.    ECOG FS:0 - Asymptomatic  LAB RESULTS: Fine phosphatase is 180.  Your enzymes are normal.  Platelet count is slightly elevated.    STUDIES: Dg Chest 1 View  05/04/2015   CLINICAL DATA:  Status post biopsy  EXAM: CHEST  1 VIEW  COMPARISON:  05/03/2015  FINDINGS: There is no pneumothorax status post right lower lobe lung mass biopsy. The right lower lobe lung mass is stable. Nipple shadows are noted. Hyperaeration. Left lung is clear.  IMPRESSION: No pneumothorax after right lung biopsy.   Electronically Signed   By: Marybelle Killings M.D.   On: 05/04/2015 10:33   Ct Chest W Contrast  04/26/2015   CLINICAL DATA:  Right lower lobe mass on previous chest x-ray. Worsening cough and congestion for 2-3 months. Smoker.  EXAM: CT CHEST WITH CONTRAST  TECHNIQUE: Multidetector CT imaging of the chest was performed during intravenous contrast administration.  CONTRAST:  30m OMNIPAQUE IOHEXOL 300 MG/ML  SOLN  COMPARISON:  None  available.  FINDINGS: There is a large mass within the right lower lobe measuring up to 7.5 cm in greatest diameter. The mass appears necrotic. This is concerning for primary lung cancer. No associated hilar, mediastinal or axillary adenopathy. No associated pleural effusion. Left lung is clear.  Heart is normal size. Aorta is normal caliber. Calcifications within the left anterior descending coronary artery. Chest wall soft tissues are unremarkable. Imaging into the upper abdomen shows no acute findings.  No acute bony abnormality or focal bone lesion.  IMPRESSION: 7.5 cm necrotic mass within the right lower lobe concerning for primary lung cancer.  Coronary artery disease.  These results will be called to the ordering clinician or representative by the Radiologist Assistant, and communication documented in the PACS or zVision Dashboard.   Electronically Signed   By: KRolm BaptiseM.D.   On: 04/26/2015 16:14   Nm Pet Image Initial (pi) Skull Base To Thigh  05/03/2015   CLINICAL DATA:  Initial treatment strategy for Lung cancer.  EXAM: NUCLEAR MEDICINE PET SKULL BASE TO THIGH  TECHNIQUE: 11.7 mCi F-18 FDG was injected intravenously. Full-ring  PET imaging was performed from the skull base to thigh after the radiotracer. CT data was obtained and used for attenuation correction and anatomic localization.  FASTING BLOOD GLUCOSE:  Value: 88 mg/dl  COMPARISON:  04/26/2015  FINDINGS: NECK  No hypermetabolic lymph nodes in the neck.  CHEST  Necrotic mass in the right lower lobe measures 7.5 by 7.4 by 9.0 cm. The SUV max associated with this mass is equal to 12.1. Adjacent area of subsegmental atelectasis is noted within the posterior right lung base. Likely postobstructive. No additional hypermetabolic pulmonary nodules or mass is identified. The tumor extends and abuts the visceral pleural. There is increased uptake associated with the right hilar, sub- carinal and right paratracheal lymph nodes. SUV max associated with  the right paratracheal lymph node is equal to 2.7. SUV max associated with the right hilar lymph node is equal to 3.2 an the FDG uptake associated with the sub- carinal node is equal to 2.68.  Heart size is normal. There is no pericardial effusion. Aortic atherosclerosis noted. There are calcifications within the LAD coronary artery.  ABDOMEN/PELVIS  No abnormal hypermetabolic activity within the liver, pancreas, adrenal glands, or spleen. Aortic atherosclerosis noted. No hypermetabolic lymph nodes in the abdomen or pelvis.  SKELETON  No focal hypermetabolic activity to suggest skeletal metastasis.  IMPRESSION: 1. Large right lower lobe lung mass is intensely hypermetabolic compatible with primary bronchogenic carcinoma. There is associated increased uptake within the right hilar, sub- carinal and right paratracheal region. 2. Assuming non-small cell histology findings are suggestive of T3N2M0 disease or stage IIIa disease. Recommend correlation with tissue sampling including bronchoscopy with right paratracheal or sub- carinal lymph node sampling. 3. Three aortic atherosclerosis   Electronically Signed   By: Kerby Moors M.D.   On: 05/03/2015 14:47   US Venous Img Lower Bilateral  05/06/2015   CLINICAL DATA:  Bilateral lower extremity edema. History of varicose veins. History of lung cancer. Evaluate for DVT.  EXAM: BILATERAL LOWER EXTREMITY VENOUS DOPPLER ULTRASOUND  TECHNIQUE: Gray-scale sonography with graded compression, as well as color Doppler and duplex ultrasound were performed to evaluate the lower extremity deep venous systems from the level of the common femoral vein and including the common femoral, femoral, profunda femoral, popliteal and calf veins including the posterior tibial, peroneal and gastrocnemius veins when visible. The superficial great saphenous vein was also interrogated. Spectral Doppler was utilized to evaluate flow at rest and with distal augmentation maneuvers in the common  femoral, femoral and popliteal veins.  COMPARISON:  None.  FINDINGS: RIGHT LOWER EXTREMITY  Common Femoral Vein: No evidence of thrombus. Normal compressibility, respiratory phasicity and response to augmentation.  Saphenofemoral Junction: No evidence of thrombus. Normal compressibility and flow on color Doppler imaging.  Profunda Femoral Vein: No evidence of thrombus. Normal compressibility and flow on color Doppler imaging.  Femoral Vein: No evidence of thrombus. Normal compressibility, respiratory phasicity and response to augmentation.  Popliteal Vein: No evidence of thrombus. Normal compressibility, respiratory phasicity and response to augmentation.  Calf Veins: No evidence of thrombus. Normal compressibility and flow on color Doppler imaging.  Superficial Great Saphenous Vein: No evidence of thrombus. Normal compressibility and flow on color Doppler imaging.  Venous Reflux:  None.  Other Findings:  None.  LEFT LOWER EXTREMITY  Common Femoral Vein: No evidence of thrombus. Normal compressibility, respiratory phasicity and response to augmentation.  Saphenofemoral Junction: No evidence of thrombus. Normal compressibility and flow on color Doppler imaging.  Profunda Femoral Vein: No evidence of  thrombus. Normal compressibility and flow on color Doppler imaging.  Femoral Vein: No evidence of thrombus. Normal compressibility, respiratory phasicity and response to augmentation.  Popliteal Vein: No evidence of thrombus. Normal compressibility, respiratory phasicity and response to augmentation.  Calf Veins: No evidence of thrombus. Normal compressibility and flow on color Doppler imaging.  Superficial Great Saphenous Vein: No evidence of thrombus. Normal compressibility and flow on color Doppler imaging.  Venous Reflux:  None.  Other Findings:  None.  IMPRESSION: No evidence of DVT within either lower extremity.   Electronically Signed   By: Sandi Mariscal M.D.   On: 05/06/2015 13:49   Ct Biopsy  05/04/2015    CLINICAL DATA:  Right lower lobe lung mass  EXAM: CT-GUIDED BIOPSY OF A RIGHT LOWER LOBE LUNG MASS  MEDICATIONS AND MEDICAL HISTORY: Versed 2 mg, Fentanyl 75 mcg.  Additional Medications: None.  ANESTHESIA/SEDATION: Moderate sedation time: 17 minutes  PROCEDURE: The procedure, risks, benefits, and alternatives were explained to the patient. Questions regarding the procedure were encouraged and answered. The patient understands and consents to the procedure.  The right flank in the prone position was prepped with ChloraPrep in a sterile fashion, and a sterile drape was applied covering the operative field. A sterile gown and sterile gloves were used for the procedure.  Under CT guidance, a(n) 17 gauge guide needle was advanced into the right lower lobe lung mass. Subsequently 3 18 gauge core biopsies were obtained. The guide needle was removed. Final imaging was performed.  Patient tolerated the procedure well without complication. Vital sign monitoring by nursing staff during the procedure will continue as patient is in the special procedures unit for post procedure observation.  FINDINGS: The images document guide needle placement within the right lower lobe lung mass. Post biopsy images demonstrate no pneumothorax.  COMPLICATIONS: None  IMPRESSION: Successful CT-guided core biopsy of a right lower lobe lung mass.   Electronically Signed   By: Marybelle Killings M.D.   On: 05/04/2015 11:11    ASSESSMENT:  Non-small cell carcinoma of lung, squamous cell.  Right lower lobe T3 N2 M0 tumor stage IIIa PET scan has been reviewed Pathology has been reviewed Lower extremity Doppler studies negative for DVT Because of high alkaline phosphatase is a bone scan has been ordered PLAN:   I had detailed discussion with the various options of therapy which includes primary surgery.  Possibility of radiation chemotherapy Even though overall experience with induction chemotherapy followed by surgical intervention is poor  considering patient's young gauge may have to be considered that.  We may proceed with 2 cycles of chemotherapy followed by reevaluation.  Pulmonary function test being evaluated. Discuss that with Dr. Faith Rogue.  And the patient and family. Total duration of visit was 45 minutes.  50% or more time was spent in counseling patient and family regarding prognosis and options of treatment and available resources   Patient expressed understanding and was in agreement with this plan. She also understands that She can call clinic at any time with any questions, concerns, or complaints.    No matching staging information was found for the patient.  Forest Gleason, MD   05/07/2015 7:26 AM      [place] @ [facility] Telephone:(336) [phone]  Fax:(336) [phone]

## 2015-05-10 ENCOUNTER — Ambulatory Visit
Admission: RE | Admit: 2015-05-10 | Discharge: 2015-05-10 | Disposition: A | Discharge status: Home / Self Care | Payer: Managed Care, Other (non HMO) | Source: Ambulatory Visit | Attending: Cardiothoracic Surgery | Admitting: Cardiothoracic Surgery

## 2015-05-10 ENCOUNTER — Encounter
Admission: RE | Disposition: A | Discharge status: Home / Self Care | Payer: Self-pay | Source: Ambulatory Visit | Attending: Cardiothoracic Surgery

## 2015-05-10 ENCOUNTER — Ambulatory Visit: Payer: Managed Care, Other (non HMO) | Admitting: Certified Registered Nurse Anesthetist

## 2015-05-10 ENCOUNTER — Other Ambulatory Visit: Payer: Self-pay | Admitting: Oncology

## 2015-05-10 ENCOUNTER — Encounter: Payer: Self-pay | Admitting: *Deleted

## 2015-05-10 ENCOUNTER — Ambulatory Visit: Payer: Managed Care, Other (non HMO)

## 2015-05-10 DIAGNOSIS — J449 Chronic obstructive pulmonary disease, unspecified: Secondary | ICD-10-CM | POA: Diagnosis not present

## 2015-05-10 DIAGNOSIS — C3431 Malignant neoplasm of lower lobe, right bronchus or lung: Secondary | ICD-10-CM | POA: Diagnosis not present

## 2015-05-10 DIAGNOSIS — Z09 Encounter for follow-up examination after completed treatment for conditions other than malignant neoplasm: Secondary | ICD-10-CM

## 2015-05-10 DIAGNOSIS — C3491 Malignant neoplasm of unspecified part of right bronchus or lung: Secondary | ICD-10-CM

## 2015-05-10 DIAGNOSIS — R609 Edema, unspecified: Secondary | ICD-10-CM

## 2015-05-10 DIAGNOSIS — Z79899 Other long term (current) drug therapy: Secondary | ICD-10-CM | POA: Diagnosis not present

## 2015-05-10 DIAGNOSIS — Z801 Family history of malignant neoplasm of trachea, bronchus and lung: Secondary | ICD-10-CM | POA: Insufficient documentation

## 2015-05-10 DIAGNOSIS — Z7951 Long term (current) use of inhaled steroids: Secondary | ICD-10-CM | POA: Diagnosis not present

## 2015-05-10 DIAGNOSIS — Z90710 Acquired absence of both cervix and uterus: Secondary | ICD-10-CM | POA: Insufficient documentation

## 2015-05-10 DIAGNOSIS — F17210 Nicotine dependence, cigarettes, uncomplicated: Secondary | ICD-10-CM | POA: Insufficient documentation

## 2015-05-10 DIAGNOSIS — C3490 Malignant neoplasm of unspecified part of unspecified bronchus or lung: Secondary | ICD-10-CM

## 2015-05-10 HISTORY — PX: PORTACATH PLACEMENT: SHX2246

## 2015-05-10 SURGERY — INSERTION, TUNNELED CENTRAL VENOUS DEVICE, WITH PORT
Anesthesia: General | Laterality: Right | Wound class: Clean

## 2015-05-10 MED ORDER — LIDOCAINE HCL (PF) 1 % IJ SOLN
INTRAMUSCULAR | Status: AC
  Filled 2015-05-10: qty 30

## 2015-05-10 MED ORDER — FENTANYL CITRATE (PF) 100 MCG/2ML IJ SOLN
INTRAMUSCULAR | Status: DC | PRN
  Administered 2015-05-10 (×4): 25 ug via INTRAVENOUS
  Administered 2015-05-10: 50 ug via INTRAVENOUS

## 2015-05-10 MED ORDER — PROPOFOL 10 MG/ML IV BOLUS
INTRAVENOUS | Status: DC | PRN
  Administered 2015-05-10: 110 mg via INTRAVENOUS
  Administered 2015-05-10: 100 mg via INTRAVENOUS
  Administered 2015-05-10: 90 mg via INTRAVENOUS

## 2015-05-10 MED ORDER — MIDAZOLAM HCL 2 MG/2ML IJ SOLN
INTRAMUSCULAR | Status: DC | PRN
  Administered 2015-05-10: 2 mg via INTRAVENOUS

## 2015-05-10 MED ORDER — ONDANSETRON HCL 4 MG/2ML IJ SOLN
INTRAMUSCULAR | Status: DC | PRN
  Administered 2015-05-10: 4 mg via INTRAVENOUS

## 2015-05-10 MED ORDER — CEFAZOLIN SODIUM-DEXTROSE 2-3 GM-% IV SOLR
INTRAVENOUS | Status: AC
  Administered 2015-05-10: 2 g via INTRAVENOUS
  Filled 2015-05-10: qty 50

## 2015-05-10 MED ORDER — FENTANYL CITRATE (PF) 100 MCG/2ML IJ SOLN: 25.0000 ug | INTRAMUSCULAR | Status: DC | PRN

## 2015-05-10 MED ORDER — TRAMADOL HCL 50 MG PO TABS: 25.0000 mg | ORAL_TABLET | Freq: Four times a day (QID) | ORAL | Status: DC | PRN

## 2015-05-10 MED ORDER — PHENYLEPHRINE HCL 10 MG/ML IJ SOLN
INTRAMUSCULAR | Status: DC | PRN
Start: 2015-05-10 — End: 2015-05-10
  Administered 2015-05-10 (×3): 100 ug via INTRAVENOUS
  Administered 2015-05-10: 200 ug via INTRAVENOUS

## 2015-05-10 MED ORDER — CEFAZOLIN SODIUM-DEXTROSE 2-3 GM-% IV SOLR
2.0000 g | INTRAVENOUS | Status: AC
  Administered 2015-05-10: 2 g via INTRAVENOUS

## 2015-05-10 MED ORDER — HEPARIN SODIUM (PORCINE) 5000 UNIT/ML IJ SOLN
INTRAMUSCULAR | Status: AC
  Filled 2015-05-10: qty 1

## 2015-05-10 MED ORDER — ONDANSETRON HCL 4 MG/2ML IJ SOLN: 4.0000 mg | Freq: Once | INTRAMUSCULAR | Status: DC | PRN

## 2015-05-10 MED ORDER — LACTATED RINGERS IV SOLN
INTRAVENOUS | Status: DC
  Administered 2015-05-10 (×2): via INTRAVENOUS

## 2015-05-10 MED ORDER — SODIUM CHLORIDE 0.9 % IV SOLN
INTRAVENOUS | Status: DC | PRN
  Administered 2015-05-10: 12 mL via INTRAMUSCULAR

## 2015-05-10 MED ORDER — LIDOCAINE HCL 1 % IJ SOLN
INTRAMUSCULAR | Status: DC | PRN
  Administered 2015-05-10: 6 mL via INTRADERMAL

## 2015-05-10 MED ORDER — GLYCOPYRROLATE 0.2 MG/ML IJ SOLN
INTRAMUSCULAR | Status: DC | PRN
  Administered 2015-05-10: 0.2 mg via INTRAVENOUS

## 2015-05-10 MED ORDER — FAMOTIDINE 20 MG PO TABS
ORAL_TABLET | ORAL | Status: AC
  Administered 2015-05-10: 20 mg via ORAL
  Filled 2015-05-10: qty 1

## 2015-05-10 MED ORDER — FAMOTIDINE 20 MG PO TABS
20.0000 mg | ORAL_TABLET | Freq: Once | ORAL | Status: AC
  Administered 2015-05-10: 20 mg via ORAL

## 2015-05-10 MED ORDER — LIDOCAINE HCL (CARDIAC) 20 MG/ML IV SOLN
INTRAVENOUS | Status: DC | PRN
  Administered 2015-05-10: 80 mg via INTRAVENOUS

## 2015-05-10 SURGICAL SUPPLY — 34 items
BAG DECANTER STRL (MISCELLANEOUS) ×2 IMPLANT
BLADE SURG SZ11 CARB STEEL (BLADE) ×2 IMPLANT
CANISTER SUCT 1200ML W/VALVE (MISCELLANEOUS) ×2 IMPLANT
CHLORAPREP W/TINT 26ML (MISCELLANEOUS) ×2 IMPLANT
COVER LIGHT HANDLE STERIS (MISCELLANEOUS) ×4 IMPLANT
DRAPE C-ARM XRAY 36X54 (DRAPES) ×2 IMPLANT
DRESSING TELFA 4X3 1S ST N-ADH (GAUZE/BANDAGES/DRESSINGS) ×2 IMPLANT
DRSG TEGADERM 2-3/8X2-3/4 SM (GAUZE/BANDAGES/DRESSINGS) ×2 IMPLANT
DRSG TEGADERM 4X4.75 (GAUZE/BANDAGES/DRESSINGS) ×2 IMPLANT
DRSG TELFA 3X8 NADH (GAUZE/BANDAGES/DRESSINGS) ×2 IMPLANT
ELECT CAUTERY BLADE TIP 2.5 (TIP) ×2
ELECTRODE CAUTERY BLDE TIP 2.5 (TIP) ×1 IMPLANT
GLOVE EXAM LX STRL 7.5 (GLOVE) ×10 IMPLANT
GOWN STRL REUS W/ TWL LRG LVL3 (GOWN DISPOSABLE) ×3 IMPLANT
GOWN STRL REUS W/TWL LRG LVL3 (GOWN DISPOSABLE) ×3
IV NS 500ML (IV SOLUTION) ×1
IV NS 500ML BAXH (IV SOLUTION) ×1 IMPLANT
KIT RM TURNOVER STRD PROC AR (KITS) ×2 IMPLANT
LABEL OR SOLS (LABEL) IMPLANT
MARKER SKIN W/RULER 31145785 (MISCELLANEOUS) ×2 IMPLANT
NDL SAFETY 22GX1.5 (NEEDLE) ×2 IMPLANT
NEEDLE FILTER BLUNT 18X 1/2SAF (NEEDLE) ×1
NEEDLE FILTER BLUNT 18X1 1/2 (NEEDLE) ×1 IMPLANT
NS IRRIG 500ML POUR BTL (IV SOLUTION) ×2 IMPLANT
PACK PORT-A-CATH (MISCELLANEOUS) ×2 IMPLANT
PAD GROUND ADULT SPLIT (MISCELLANEOUS) ×2 IMPLANT
SUT ETHILON 4-0 (SUTURE) ×2
SUT ETHILON 4-0 FS2 18XMFL BLK (SUTURE) ×2
SUT PROLENE 2 0 SH DA (SUTURE) ×4 IMPLANT
SUT VIC AB 3-0 SH 27 (SUTURE) ×1
SUT VIC AB 3-0 SH 27X BRD (SUTURE) ×1 IMPLANT
SUTURE ETHLN 4-0 FS2 18XMF BLK (SUTURE) ×2 IMPLANT
SYR 3ML LL SCALE MARK (SYRINGE) ×2 IMPLANT
SYRINGE 10CC LL (SYRINGE) ×2 IMPLANT

## 2015-05-10 NOTE — Patient Instructions (Signed)
Paclitaxel injection What is this medicine? PACLITAXEL (PAK li TAX el) is a chemotherapy drug. It targets fast dividing cells, like cancer cells, and causes these cells to die. This medicine is used to treat ovarian cancer, breast cancer, and other cancers. This medicine may be used for other purposes; ask your health care provider or pharmacist if you have questions. COMMON BRAND NAME(S): Onxol, Taxol What should I tell my health care provider before I take this medicine? They need to know if you have any of these conditions: -blood disorders -irregular heartbeat -infection (especially a virus infection such as chickenpox, cold sores, or herpes) -liver disease -previous or ongoing radiation therapy -an unusual or allergic reaction to paclitaxel, alcohol, polyoxyethylated castor oil, other chemotherapy agents, other medicines, foods, dyes, or preservatives -pregnant or trying to get pregnant -breast-feeding How should I use this medicine? This drug is given as an infusion into a vein. It is administered in a hospital or clinic by a specially trained health care professional. Talk to your pediatrician regarding the use of this medicine in children. Special care may be needed. Overdosage: If you think you have taken too much of this medicine contact a poison control center or emergency room at once. NOTE: This medicine is only for you. Do not share this medicine with others. What if I miss a dose? It is important not to miss your dose. Call your doctor or health care professional if you are unable to keep an appointment. What may interact with this medicine? Do not take this medicine with any of the following medications: -disulfiram -metronidazole This medicine may also interact with the following medications: -cyclosporine -diazepam -ketoconazole -medicines to increase blood counts like filgrastim, pegfilgrastim, sargramostim -other chemotherapy drugs like cisplatin, doxorubicin,  epirubicin, etoposide, teniposide, vincristine -quinidine -testosterone -vaccines -verapamil Talk to your doctor or health care professional before taking any of these medicines: -acetaminophen -aspirin -ibuprofen -ketoprofen -naproxen This list may not describe all possible interactions. Give your health care provider a list of all the medicines, herbs, non-prescription drugs, or dietary supplements you use. Also tell them if you smoke, drink alcohol, or use illegal drugs. Some items may interact with your medicine. What should I watch for while using this medicine? Your condition will be monitored carefully while you are receiving this medicine. You will need important blood work done while you are taking this medicine. This drug may make you feel generally unwell. This is not uncommon, as chemotherapy can affect healthy cells as well as cancer cells. Report any side effects. Continue your course of treatment even though you feel ill unless your doctor tells you to stop. In some cases, you may be given additional medicines to help with side effects. Follow all directions for their use. Call your doctor or health care professional for advice if you get a fever, chills or sore throat, or other symptoms of a cold or flu. Do not treat yourself. This drug decreases your body's ability to fight infections. Try to avoid being around people who are sick. This medicine may increase your risk to bruise or bleed. Call your doctor or health care professional if you notice any unusual bleeding. Be careful brushing and flossing your teeth or using a toothpick because you may get an infection or bleed more easily. If you have any dental work done, tell your dentist you are receiving this medicine. Avoid taking products that contain aspirin, acetaminophen, ibuprofen, naproxen, or ketoprofen unless instructed by your doctor. These medicines may hide a fever.   Do not become pregnant while taking this medicine.  Women should inform their doctor if they wish to become pregnant or think they might be pregnant. There is a potential for serious side effects to an unborn child. Talk to your health care professional or pharmacist for more information. Do not breast-feed an infant while taking this medicine. Men are advised not to father a child while receiving this medicine. What side effects may I notice from receiving this medicine? Side effects that you should report to your doctor or health care professional as soon as possible: -allergic reactions like skin rash, itching or hives, swelling of the face, lips, or tongue -low blood counts - This drug may decrease the number of white blood cells, red blood cells and platelets. You may be at increased risk for infections and bleeding. -signs of infection - fever or chills, cough, sore throat, pain or difficulty passing urine -signs of decreased platelets or bleeding - bruising, pinpoint red spots on the skin, black, tarry stools, nosebleeds -signs of decreased red blood cells - unusually weak or tired, fainting spells, lightheadedness -breathing problems -chest pain -high or low blood pressure -mouth sores -nausea and vomiting -pain, swelling, redness or irritation at the injection site -pain, tingling, numbness in the hands or feet -slow or irregular heartbeat -swelling of the ankle, feet, hands Side effects that usually do not require medical attention (report to your doctor or health care professional if they continue or are bothersome): -bone pain -complete hair loss including hair on your head, underarms, pubic hair, eyebrows, and eyelashes -changes in the color of fingernails -diarrhea -loosening of the fingernails -loss of appetite -muscle or joint pain -red flush to skin -sweating This list may not describe all possible side effects. Call your doctor for medical advice about side effects. You may report side effects to FDA at  1-800-FDA-1088. Where should I keep my medicine? This drug is given in a hospital or clinic and will not be stored at home. NOTE: This sheet is a summary. It may not cover all possible information. If you have questions about this medicine, talk to your doctor, pharmacist, or health care provider.  2015, Elsevier/Gold Standard. (2012-11-11 16:41:21)  Carboplatin injection What is this medicine? CARBOPLATIN (KAR boe pla tin) is a chemotherapy drug. It targets fast dividing cells, like cancer cells, and causes these cells to die. This medicine is used to treat ovarian cancer and many other cancers. This medicine may be used for other purposes; ask your health care provider or pharmacist if you have questions. COMMON BRAND NAME(S): Paraplatin What should I tell my health care provider before I take this medicine? They need to know if you have any of these conditions: -blood disorders -hearing problems -kidney disease -recent or ongoing radiation therapy -an unusual or allergic reaction to carboplatin, cisplatin, other chemotherapy, other medicines, foods, dyes, or preservatives -pregnant or trying to get pregnant -breast-feeding How should I use this medicine? This drug is usually given as an infusion into a vein. It is administered in a hospital or clinic by a specially trained health care professional. Talk to your pediatrician regarding the use of this medicine in children. Special care may be needed. Overdosage: If you think you have taken too much of this medicine contact a poison control center or emergency room at once. NOTE: This medicine is only for you. Do not share this medicine with others. What if I miss a dose? It is important not to miss a dose. Call your   doctor or health care professional if you are unable to keep an appointment. What may interact with this medicine? -medicines for seizures -medicines to increase blood counts like filgrastim, pegfilgrastim,  sargramostim -some antibiotics like amikacin, gentamicin, neomycin, streptomycin, tobramycin -vaccines Talk to your doctor or health care professional before taking any of these medicines: -acetaminophen -aspirin -ibuprofen -ketoprofen -naproxen This list may not describe all possible interactions. Give your health care provider a list of all the medicines, herbs, non-prescription drugs, or dietary supplements you use. Also tell them if you smoke, drink alcohol, or use illegal drugs. Some items may interact with your medicine. What should I watch for while using this medicine? Your condition will be monitored carefully while you are receiving this medicine. You will need important blood work done while you are taking this medicine. This drug may make you feel generally unwell. This is not uncommon, as chemotherapy can affect healthy cells as well as cancer cells. Report any side effects. Continue your course of treatment even though you feel ill unless your doctor tells you to stop. In some cases, you may be given additional medicines to help with side effects. Follow all directions for their use. Call your doctor or health care professional for advice if you get a fever, chills or sore throat, or other symptoms of a cold or flu. Do not treat yourself. This drug decreases your body's ability to fight infections. Try to avoid being around people who are sick. This medicine may increase your risk to bruise or bleed. Call your doctor or health care professional if you notice any unusual bleeding. Be careful brushing and flossing your teeth or using a toothpick because you may get an infection or bleed more easily. If you have any dental work done, tell your dentist you are receiving this medicine. Avoid taking products that contain aspirin, acetaminophen, ibuprofen, naproxen, or ketoprofen unless instructed by your doctor. These medicines may hide a fever. Do not become pregnant while taking this  medicine. Women should inform their doctor if they wish to become pregnant or think they might be pregnant. There is a potential for serious side effects to an unborn child. Talk to your health care professional or pharmacist for more information. Do not breast-feed an infant while taking this medicine. What side effects may I notice from receiving this medicine? Side effects that you should report to your doctor or health care professional as soon as possible: -allergic reactions like skin rash, itching or hives, swelling of the face, lips, or tongue -signs of infection - fever or chills, cough, sore throat, pain or difficulty passing urine -signs of decreased platelets or bleeding - bruising, pinpoint red spots on the skin, black, tarry stools, nosebleeds -signs of decreased red blood cells - unusually weak or tired, fainting spells, lightheadedness -breathing problems -changes in hearing -changes in vision -chest pain -high blood pressure -low blood counts - This drug may decrease the number of white blood cells, red blood cells and platelets. You may be at increased risk for infections and bleeding. -nausea and vomiting -pain, swelling, redness or irritation at the injection site -pain, tingling, numbness in the hands or feet -problems with balance, talking, walking -trouble passing urine or change in the amount of urine Side effects that usually do not require medical attention (report to your doctor or health care professional if they continue or are bothersome): -hair loss -loss of appetite -metallic taste in the mouth or changes in taste This list may not describe all   possible side effects. Call your doctor for medical advice about side effects. You may report side effects to FDA at 1-800-FDA-1088. Where should I keep my medicine? This drug is given in a hospital or clinic and will not be stored at home. NOTE: This sheet is a summary. It may not cover all possible information. If you  have questions about this medicine, talk to your doctor, pharmacist, or health care provider.  2015, Elsevier/Gold Standard. (2007-12-24 14:38:05)  

## 2015-05-10 NOTE — H&P (Signed)
I have reviewed the history and physical and there are no changes.  The patient understands the proposed procedure and all questions answered.  Nestor Lewandowsky, MD

## 2015-05-10 NOTE — Transfer of Care (Signed)
Immediate Anesthesia Transfer of Care Note  Patient: Claudia Chavez  Procedure(s) Performed: Procedure(s): INSERTION PORT-A-CATH (Right)  Patient Location: PACU  Anesthesia Type:General  Level of Consciousness: awake and alert   Airway & Oxygen Therapy: Patient Spontanous Breathing and Patient connected to face mask oxygen  Post-op Assessment: Report given to RN and Post -op Vital signs reviewed and stable  Post vital signs: Reviewed and stable  Last Vitals:  Filed Vitals:   05/10/15 1210  BP: 135/82  Pulse: 106  Temp: 37.8 C  Resp: 15    Complications: No apparent anesthesia complications

## 2015-05-10 NOTE — Anesthesia Preprocedure Evaluation (Signed)
Anesthesia Evaluation    Airway Mallampati: II  TM Distance: >3 FB     Dental  (+) Chipped   Pulmonary pneumonia -, resolved, COPDCurrent Smoker,          Cardiovascular     Neuro/Psych PSYCHIATRIC DISORDERS Depression  Neuromuscular disease    GI/Hepatic   Endo/Other    Renal/GU      Musculoskeletal   Abdominal   Peds  Hematology   Anesthesia Other Findings   Reproductive/Obstetrics                             Anesthesia Physical Anesthesia Plan  ASA: III  Anesthesia Plan: General   Post-op Pain Management:    Induction:   Airway Management Planned: LMA  Additional Equipment:   Intra-op Plan:   Post-operative Plan:   Informed Consent:   Plan Discussed with: CRNA  Anesthesia Plan Comments:         Anesthesia Quick Evaluation

## 2015-05-10 NOTE — Op Note (Signed)
05/10/2015  12:13 PM  PATIENT:  Claudia Chavez  59 y.o. female  PRE-OPERATIVE DIAGNOSIS:  lung cancer  POST-OPERATIVE DIAGNOSIS:  lung cancer  PROCEDURE:  Procedure(s): INSERTION PORT-A-CATH (Right)  Using ultrasound and fluoroscopy SURGEON:  Surgeon(s) and Role:    * Nestor Lewandowsky, MD - Primary  ASSISTANTS: none   ANESTHESIA:   MAC  DICTATION:   The patient was brought to the operating suite and placed in the supine position. Using the ultrasound machine the right IJ was identified and suitable for cannulation. The patient was then prepped and draped in usual sterile fashion. The right IJ was percutaneously catheterized. A wire was placed into the venous system under fluoroscopic guidance. An appropriate site was selected on the chest wall and a Port-A-Cath pocket was created. The catheter was tunneled from the port site up to the insertion site. The catheter was then inserted through a peel-away sheath and positioned at the appropriate level in the superior vena cava. The catheter was then assembled and aspirated and flushed easily. It was then secured to the anterior chest wall with interrupted Prolene sutures. The catheter was flushed one last time and the wounds were then closed. The subcutaneous tissues were closed with running absorbable sutures and the skin with nylon. Sterile dressings were applied. Patient was then transported to the recovery room in stable condition.   Nestor Lewandowsky, MD

## 2015-05-10 NOTE — Anesthesia Postprocedure Evaluation (Signed)
  Anesthesia Post-op Note  Patient: Claudia Chavez  Procedure(s) Performed: Procedure(s): INSERTION PORT-A-CATH (Right)  Anesthesia type:General  Patient location: PACU  Post pain: Pain level controlled  Post assessment: Post-op Vital signs reviewed, Patient's Cardiovascular Status Stable, Respiratory Function Stable, Patent Airway and No signs of Nausea or vomiting  Post vital signs: Reviewed and stable  Last Vitals:  Filed Vitals:   05/10/15 1420  BP: 112/64  Pulse: 82  Temp: 36.6 C  Resp: 14    Level of consciousness: awake, alert  and patient cooperative  Complications: No apparent anesthesia complications

## 2015-05-10 NOTE — Discharge Instructions (Signed)
AMBULATORY SURGERY  DISCHARGE INSTRUCTIONS   1) The drugs that you were given will stay in your system until tomorrow so for the next 24 hours you should not:  A) Drive an automobile B) Make any legal decisions C) Drink any alcoholic beverage   2) You may resume regular meals tomorrow.  Today it is better to start with liquids and gradually work up to solid foods.  You may eat anything you prefer, but it is better to start with liquids, then soup and crackers, and gradually work up to solid foods.   3) Please notify your doctor immediately if you have any unusual bleeding, trouble breathing, redness and pain at the surgery site, drainage, fever, or pain not relieved by medication.    4) Additional Instructions:        Please contact your physician with any problems or Same Day Surgery at [phone], Monday through Friday 6 am to 4 pm, or [place] at [facility] number at [phone].Implanted [facility] Guide An implanted port is a type of central line that is placed under the skin. Central lines are used to provide IV access when treatment or nutrition needs to be given through a person's veins. Implanted ports are used for long-term IV access. An implanted port may be placed because:   You need IV medicine that would be irritating to the small veins in your hands or arms.   You need long-term IV medicines, such as antibiotics.   You need IV nutrition for a long period.   You need frequent blood draws for lab tests.   You need dialysis.  Implanted ports are usually placed in the chest area, but they can also be placed in the upper arm, the abdomen, or the leg. An implanted port has two main parts:   Reservoir. The reservoir is round and will appear as a small, raised area under your skin. The reservoir is the part where a needle is inserted to give medicines or draw blood.   Catheter. The catheter is a thin, flexible tube that extends from the reservoir. The  catheter is placed into a large vein. Medicine that is inserted into the reservoir goes into the catheter and then into the vein.  HOW WILL I CARE FOR MY INCISION SITE? Do not get the incision site wet. Bathe or shower as directed by your health care provider.  HOW IS MY PORT ACCESSED? Special steps must be taken to access the port:   Before the port is accessed, a numbing cream can be placed on the skin. This helps numb the skin over the port site.   Your health care provider uses a sterile technique to access the port.  Your health care provider must put on a mask and sterile gloves.  The skin over your port is cleaned carefully with an antiseptic and allowed to dry.  The port is gently pinched between sterile gloves, and a needle is inserted into the port.  Only "non-coring" port needles should be used to access the port. Once the port is accessed, a blood return should be checked. This helps ensure that the port is in the vein and is not clogged.   If your port needs to remain accessed for a constant infusion, a clear (transparent) bandage will be placed over the needle site. The bandage and needle will need to be changed every week, or as directed by your health care provider.   Keep the bandage covering the needle clean and dry.  Do not get it wet. Follow your health care provider's instructions on how to take a shower or bath while the port is accessed.   If your port does not need to stay accessed, no bandage is needed over the port.  WHAT IS FLUSHING? Flushing helps keep the port from getting clogged. Follow your health care provider's instructions on how and when to flush the port. Ports are usually flushed with saline solution or a medicine called heparin. The need for flushing will depend on how the port is used.   If the port is used for intermittent medicines or blood draws, the port will need to be flushed:   After medicines have been given.   After blood has been  drawn.   As part of routine maintenance.   If a constant infusion is running, the port may not need to be flushed.  HOW LONG WILL MY PORT STAY IMPLANTED? The port can stay in for as long as your health care provider thinks it is needed. When it is time for the port to come out, surgery will be done to remove it. The procedure is similar to the one performed when the port was put in.  WHEN SHOULD I SEEK IMMEDIATE MEDICAL CARE? When you have an implanted port, you should seek immediate medical care if:   You notice a bad smell coming from the incision site.   You have swelling, redness, or drainage at the incision site.   You have more swelling or pain at the port site or the surrounding area.   You have a fever that is not controlled with medicine. Document Released: 09/18/2005 Document Revised: 07/09/2013 Document Reviewed: 05/26/2013 [facility] Patient Information 2015 [place], [place]. This information is not intended to replace advice given to you by your health care provider. Make sure you discuss any questions you have with your health care provider.

## 2015-05-11 ENCOUNTER — Ambulatory Visit: Payer: Managed Care, Other (non HMO)

## 2015-05-12 ENCOUNTER — Ambulatory Visit
Admission: RE | Admit: 2015-05-12 | Discharge: 2015-05-12 | Disposition: A | Discharge status: Home / Self Care | Payer: Managed Care, Other (non HMO) | Source: Ambulatory Visit | Attending: Family Medicine | Admitting: Family Medicine

## 2015-05-12 DIAGNOSIS — M810 Age-related osteoporosis without current pathological fracture: Secondary | ICD-10-CM | POA: Insufficient documentation

## 2015-05-12 MED ORDER — TECHNETIUM TC 99M MEDRONATE IV KIT
25.0000 | PACK | Freq: Once | INTRAVENOUS | Status: AC | PRN
  Administered 2015-05-12: 23.38 via INTRAVENOUS

## 2015-05-17 ENCOUNTER — Inpatient Hospital Stay: Payer: Managed Care, Other (non HMO) | Admitting: Cardiothoracic Surgery

## 2015-05-18 ENCOUNTER — Other Ambulatory Visit: Payer: Self-pay | Admitting: *Deleted

## 2015-05-18 ENCOUNTER — Other Ambulatory Visit: Payer: Self-pay | Admitting: Oncology

## 2015-05-18 DIAGNOSIS — C3431 Malignant neoplasm of lower lobe, right bronchus or lung: Secondary | ICD-10-CM

## 2015-05-18 MED ORDER — PROCHLORPERAZINE MALEATE 10 MG PO TABS: 10.0000 mg | ORAL_TABLET | Freq: Four times a day (QID) | ORAL | Status: DC | PRN

## 2015-05-18 MED ORDER — LIDOCAINE-PRILOCAINE 2.5-2.5 % EX CREA: TOPICAL_CREAM | CUTANEOUS | Status: DC

## 2015-05-18 MED ORDER — DEXAMETHASONE 4 MG PO TABS: 8.0000 mg | ORAL_TABLET | Freq: Two times a day (BID) | ORAL | Status: DC

## 2015-05-18 MED ORDER — ONDANSETRON HCL 8 MG PO TABS: 8.0000 mg | ORAL_TABLET | Freq: Two times a day (BID) | ORAL | Status: DC

## 2015-05-19 ENCOUNTER — Telehealth: Payer: Self-pay | Admitting: *Deleted

## 2015-05-19 NOTE — Telephone Encounter (Signed)
Left message for Claudia Chavez that Dr. Oliva Bustard has given approval for patient to have neulasta injections at home.

## 2015-05-19 NOTE — Telephone Encounter (Signed)
Claudia Chavez, clinical pharmacist for [facility] called regarding order for Neulasta syringes.  With MD approval, Christella Scheuermann can arrange for patient to have injections at home and will take care of all arrangements.  Can accept verbal order. Please call back with MD decision.

## 2015-05-19 NOTE — Telephone Encounter (Signed)
Okay to have [employer] arrange for neulasta injections at home.

## 2015-05-20 ENCOUNTER — Inpatient Hospital Stay (HOSPITAL_BASED_OUTPATIENT_CLINIC_OR_DEPARTMENT_OTHER): Payer: Managed Care, Other (non HMO) | Admitting: Oncology

## 2015-05-20 ENCOUNTER — Encounter: Payer: Self-pay | Admitting: Oncology

## 2015-05-20 ENCOUNTER — Ambulatory Visit: Payer: Managed Care, Other (non HMO) | Admitting: Oncology

## 2015-05-20 ENCOUNTER — Other Ambulatory Visit: Payer: Managed Care, Other (non HMO)

## 2015-05-20 ENCOUNTER — Inpatient Hospital Stay: Payer: Managed Care, Other (non HMO)

## 2015-05-20 ENCOUNTER — Encounter (INDEPENDENT_AMBULATORY_CARE_PROVIDER_SITE_OTHER): Payer: Self-pay

## 2015-05-20 VITALS — BP 145/75 | HR 97 | Temp 97.6°F | Resp 18 | Wt 108.0 lb

## 2015-05-20 DIAGNOSIS — Z79899 Other long term (current) drug therapy: Secondary | ICD-10-CM | POA: Diagnosis not present

## 2015-05-20 DIAGNOSIS — R609 Edema, unspecified: Secondary | ICD-10-CM

## 2015-05-20 DIAGNOSIS — I2510 Atherosclerotic heart disease of native coronary artery without angina pectoris: Secondary | ICD-10-CM

## 2015-05-20 DIAGNOSIS — M818 Other osteoporosis without current pathological fracture: Secondary | ICD-10-CM | POA: Diagnosis not present

## 2015-05-20 DIAGNOSIS — C3431 Malignant neoplasm of lower lobe, right bronchus or lung: Secondary | ICD-10-CM

## 2015-05-20 DIAGNOSIS — Z90710 Acquired absence of both cervix and uterus: Secondary | ICD-10-CM

## 2015-05-20 DIAGNOSIS — R05 Cough: Secondary | ICD-10-CM

## 2015-05-20 DIAGNOSIS — R599 Enlarged lymph nodes, unspecified: Secondary | ICD-10-CM

## 2015-05-20 LAB — CBC WITH DIFFERENTIAL/PLATELET
Basophils Absolute: 0.1 10*3/uL (ref 0–0.1)
Basophils Relative: 1 %
Eosinophils Absolute: 0.2 10*3/uL (ref 0–0.7)
Eosinophils Relative: 2 %
HCT: 36.2 % (ref 35.0–47.0)
HEMOGLOBIN: 12.3 g/dL (ref 12.0–16.0)
LYMPHS ABS: 1.7 10*3/uL (ref 1.0–3.6)
Lymphocytes Relative: 19 %
MCH: 33.5 pg (ref 26.0–34.0)
MCHC: 34.1 g/dL (ref 32.0–36.0)
MCV: 98.4 fL (ref 80.0–100.0)
MONO ABS: 0.8 10*3/uL (ref 0.2–0.9)
Monocytes Relative: 8 %
NEUTROS ABS: 6.4 10*3/uL (ref 1.4–6.5)
NEUTROS PCT: 70 %
Platelets: 496 10*3/uL — ABNORMAL HIGH (ref 150–440)
RBC: 3.68 MIL/uL — ABNORMAL LOW (ref 3.80–5.20)
RDW: 13.9 % (ref 11.5–14.5)
WBC: 9.2 10*3/uL (ref 3.6–11.0)

## 2015-05-20 LAB — COMPREHENSIVE METABOLIC PANEL
ALK PHOS: 217 U/L — AB (ref 38–126)
ALT: 14 U/L (ref 14–54)
AST: 20 U/L (ref 15–41)
Albumin: 3.1 g/dL — ABNORMAL LOW (ref 3.5–5.0)
Anion gap: 8 (ref 5–15)
BILIRUBIN TOTAL: 0.3 mg/dL (ref 0.3–1.2)
BUN: 10 mg/dL (ref 6–20)
CO2: 26 mmol/L (ref 22–32)
Calcium: 8.6 mg/dL — ABNORMAL LOW (ref 8.9–10.3)
Chloride: 102 mmol/L (ref 101–111)
Creatinine, Ser: 0.5 mg/dL (ref 0.44–1.00)
GFR calc Af Amer: >60 mL/min (ref >60)
Glucose, Bld: 114 mg/dL — ABNORMAL HIGH (ref 65–99)
Potassium: 3.3 mmol/L — ABNORMAL LOW (ref 3.5–5.1)
Sodium: 136 mmol/L (ref 135–145)
TOTAL PROTEIN: 7.1 g/dL (ref 6.5–8.1)

## 2015-05-20 MED ORDER — DEXAMETHASONE 4 MG PO TABS: ORAL_TABLET | ORAL | Status: DC

## 2015-05-20 MED ORDER — HYDROCOD POLST-CPM POLST ER 10-8 MG/5ML PO SUER: 5.0000 mL | Freq: Every evening | ORAL | Status: DC | PRN

## 2015-05-20 NOTE — Progress Notes (Signed)
[place] @ [facility] Telephone:(336) [phone]  Fax:(336) [phone]   [name]: 1955-10-22  MR#: [id number]  JJO#:[id number]  Patient Care Team: Claudia Pink, MD as PCP - General (Family Medicine)  CHIEF COMPLAINT:  1.  Squamous cell carcinoma of right  LOWER LOBE OF  lung stage IIIA based on PET scan Biopsy of the (August of 2016)   VISIT DIAGNOSIS:     ICD-9-CM ICD-10-CM   1. Cancer of lower lobe of right lung 162.5 C34.31 dexamethasone (DECADRON) 4 MG tablet     chlorpheniramine-HYDROcodone (TUSSIONEX PENNKINETIC ER) 10-8 MG/5ML SUER     Comprehensive metabolic panel     Magnesium    CANCER  of lung  No history exists.    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  Started having cough and congestion.  Dry hacking cough.  No hemoptysis.  No chest pain.  Initial antibiotic therapy was done but hacking cough continues with chest x-ray was done which was abnormal and CT scan was done on JULY  25th which revealed 7.5 cm mass in the right lower lobe Patient was referred to me for further evaluation and treatment consideration.  May 20, 2015 Patient continues just revere dry hacking cough.  Cannot lie down in the night.  Because of continuous cough.  Patient was given seen next to try the nighttime.  Patient had a port placement and here for further evaluation regarding neoadjuvant chemotherapy with carboplatinum and Taxol. Patient has received chemotherapy education and has number of questions REVIEW OF SYSTEMS:   GENERAL:  Feels good.  Active.  No fevers, sweats or weight loss. PERFORMANCE STATUS (ECOG): 0 HEENT:  No visual changes, runny nose, sore throat, mouth sores or tenderness. Lungs: Dry hacking cough.  No hemoptysis no chest pain patient is a chronic smoker Cardiac:  No chest pain, palpitations, orthopnea, or PND. GI:  No nausea, vomiting, diarrhea,  constipation, melena or hematochezia. GU:  No urgency, frequency, dysuria, or hematuria. Musculoskeletal:  No back pain.  No joint pain.  No muscle tenderness. Extremities:  No pain or swelling. Skin:  No rashes or skin changes. Neuro:  No headache, numbness or weakness, balance or coordination issues. Endocrine:  No diabetes, thyroid issues, hot flashes or night sweats. Psych:  No mood changes, depression or anxiety. Pain:  No focal pain. Review of systems:  All other systems reviewed and found to be negative.  As per HPI. Otherwise, a complete review of systems is negatve.  PAST MEDICAL HISTORY: No significant past medical history.  Denies any diabetes or hypertensio occasional joint pains recently had some swelling of the left ankle area ultrasound was negative for deep vein thrombosis PAST SURGICAL HISTORY: Patient had a hysterectomy.  No other significant past history FAMILY HISTORY There is no significant family history of breast cancer, ovarian cancer, colon cancer GYNECOLOGIC HISTORY:  No LMP recorded. Patient has had a hysterectomy.     ADVANCED DIRECTIVES:    HEALTH MAINTENANCE: Social History  Substance Use Topics  . Smoking status: Current Every Day Smoker -- 1.00 packs/day for 40 years    Types: Cigarettes  . Smokeless tobacco: None     Comment: "1 pk or less per day"  . Alcohol Use: 0.0 oz/week    0 Standard drinks or equivalent per week     Comment: beer-occasionally      No Known Allergies  Current Outpatient Prescriptions  Medication Sig Dispense Refill  . albuterol (PROAIR HFA) 108 (90 BASE) MCG/ACT inhaler Inhale into the lungs.    Marland Kitchen dexamethasone (DECADRON) 4 MG tablet Take 2 tabs BID the day before chemotherapy then take 2 tabs BID the day after chemotherapy. 30 tablet 1  . lidocaine-prilocaine (EMLA) cream Apply to affected area once 30 g 3  . mometasone-formoterol (DULERA) 200-5 MCG/ACT AERO Use 2 puffs two times daily    . ondansetron (ZOFRAN) 8  MG tablet Take 1 tablet (8 mg total) by mouth 2 (two) times daily. Start the day after chemo for 3 days. Then take as needed for nausea or vomiting. 30 tablet 1  . prochlorperazine (COMPAZINE) 10 MG tablet Take 1 tablet (10 mg total) by mouth every 6 (six) hours as needed (Nausea or vomiting). 30 tablet 1  . traMADol (ULTRAM) 50 MG tablet Take 0.5 tablets (25 mg total) by mouth every 6 (six) hours as needed. 30 tablet 0  . Vitamin D, Ergocalciferol, (DRISDOL) 50000 UNITS CAPS capsule Take by mouth.    . chlorpheniramine-HYDROcodone (TUSSIONEX PENNKINETIC ER) 10-8 MG/5ML SUER Take 5 mLs by mouth at bedtime as needed for cough. 140 mL 0   No current facility-administered medications for this visit.    OBJECTIVE: PHYSICAL EXAM: GENERAL:  Well developed, well nourished, sitting comfortably in the exam room in no acute distress. MENTAL STATUS:  Alert and oriented to person, place and time.  ENT:  Oropharynx clear without lesion.  Tongue normal. Mucous membranes moist.  RESPIRATORY:  Clear to auscultation without rales, wheezes or rhonchi. CARDIOVASCULAR:  Regular rate and rhythm without murmur, rub or gallop. BREAST:  Right breast without masses, skin changes or nipple discharge.  Left breast without masses, skin changes or nipple discharge. ABDOMEN:  Soft, non-tender, with active bowel sounds, and no hepatosplenomegaly.  No masses. BACK:  No CVA tenderness.  No tenderness on percussion of the back or rib cage. SKIN:  No rashes, ulcers or lesions. EXTREMITIES: No edema, no skin discoloration or tenderness.  No palpable cords. LYMPH NODES: No palpable cervical, supraclavicular, axillary or inguinal adenopathy  NEUROLOGICAL: Unremarkable. PSYCH:  Appropriate.  Filed Vitals:   05/20/15 1039  BP: 145/75  Pulse: 97  Temp: 97.6 F (36.4 C)  Resp: 18     Body mass index is 20.42 kg/(m^2).    ECOG FS:0 - Asymptomatic  LAB RESULTS: All the lab data has been reviewed.   9:33 AM  3wk ago        WBC 3.6 - 11.0 K/uL 9.2 8.0    RBC 3.80 - 5.20 MIL/uL 3.68 (L) 3.83    Hemoglobin 12.0 - 16.0 g/dL 12.3 12.8    HCT 35.0 - 47.0 % 36.2 38.5    MCV 80.0 - 100.0 fL 98.4 100.5 (H)    MCH 26.0 - 34.0 pg 33.5 33.4    MCHC 32.0 - 36.0 g/dL 34.1 33.2    RDW 11.5 - 14.5 % 13.9 14.5    Platelets 150 - 440 K/uL 496 (H) 446 (H)    Neutrophils Relative % % 70 67    Neutro Abs 1.4 - 6.5 K/uL 6.4 5.4    Lymphocytes Relative % 19 20    Lymphs Abs 1.0 - 3.6 K/uL 1.7 1.6    Monocytes Relative % 8 10    Monocytes Absolute 0.2 - 0.9 K/uL 0.8 0.8    Eosinophils Relative % 2 2    Eosinophils Absolute 0 - 0.7 K/uL 0.2 0.1  Basophils Relative % 1 1    Basophils Absolute 0 - 0.1 K/uL 0.1 0.1   Resulting Agency  [facility]      Specimen Collected: 05/20/15 9:33 AM Last Resulted: 05/20/15 9:48 AM                      Other Results from 05/20/2015         Comprehensive metabolic panel  Status: Finalresult Visible to patient:  Not Released Nextappt: 05/21/2015 at 09:00 AM in Oncology ([facility]) Dx:  Cancer of lower lobe of right lung              Ref Range 9:33 AM  3wk ago     Sodium 135 - 145 mmol/L 136 133 (L)    Potassium 3.5 - 5.1 mmol/L 3.3 (L) 3.6    Chloride 101 - 111 mmol/L 102 101    CO2 22 - 32 mmol/L 26 26    Glucose, Bld 65 - 99 mg/dL 114 (H) 103 (H)    BUN 6 - 20 mg/dL 10 11    Creatinine, Ser 0.44 - 1.00 mg/dL 0.50 0.59    Calcium 8.9 - 10.3 mg/dL 8.6 (L) 8.4 (L)    Total Protein 6.5 - 8.1 g/dL 7.1 7.1    Albumin 3.5 - 5.0 g/dL 3.1 (L) 3.5    AST 15 - 41 U/L 20 17    ALT 14 - 54 U/L 14 13 (L)    Alkaline Phosphatase 38 - 126 U/L 217 (H) 180 (H)    Total Bilirubin 0.3 - 1.2 mg/dL 0.3 0.6    GFR calc non Af Amer >60 mL/min >60 60 mL/min" class="[email or web address]" style="cursor: pointer;" onmouseover='jscript: var varStyle="underline"; var bgColor="#[id number]"; this.style.backgroundColor=bgColor; var  children=this.getElementsByTagName("div"); for(var child=0;child >60    GFR calc Af Amer >60 mL/min >60 60 mL/min" class="[email or web address]" style="cursor: pointer;" onmouseover='jscript: var varStyle="underline"; var bgColor="#[id number]"; this.style.backgroundColor=bgColor; var children=this.getElementsByTagName("div"); for(var child=0;child >60CM   Comments: (NOTE)  The eGFR has been calculated using the CKD EPI equation.  This calculation has not been validated in all clinical situations.  eGFR's persistently <60 mL/min signify possible Chronic Kidney  Disease.      Anion gap 5 - 15  8 6    Resulting Agency  [facility]      Specimen Collected: 05/20/15 9:33 AM Last Resulted: 05/20/15 9:53 AM             CM=Additional comments              Encounter     View Encounter      Result Information      Status    Abnormal Final result (05/20/2015 9:48 AM)    Provider Status: Ordered        Lab Information     [facility]     STUDIES: Dg Chest 1 View  05/04/2015   CLINICAL DATA:  Status post biopsy  EXAM: CHEST  1 VIEW  COMPARISON:  05/03/2015  FINDINGS: There is no pneumothorax status post right lower lobe lung mass biopsy. The right lower lobe lung mass is stable. Nipple shadows are noted. Hyperaeration. Left lung is clear.  IMPRESSION: No pneumothorax after right lung biopsy.   Electronically Signed   By: Marybelle Killings M.D.   On: 05/04/2015 10:33   Ct Chest W Contrast  04/26/2015   CLINICAL DATA:  Right lower lobe mass on previous chest x-ray. Worsening cough and congestion for 2-3 months. Smoker.  EXAM: CT CHEST WITH CONTRAST  TECHNIQUE: Multidetector CT imaging of the chest was performed during intravenous contrast administration.  CONTRAST:  28m OMNIPAQUE IOHEXOL 300 MG/ML  SOLN  COMPARISON:  None available.  FINDINGS: There is a large mass within the right lower lobe measuring up to 7.5 cm in greatest diameter. The mass appears necrotic. This is concerning  for primary lung cancer. No associated hilar, mediastinal or axillary adenopathy. No associated pleural effusion. Left lung is clear.  Heart is normal size. Aorta is normal caliber. Calcifications within the left anterior descending coronary artery. Chest wall soft tissues are unremarkable. Imaging into the upper abdomen shows no acute findings.  No acute bony abnormality or focal bone lesion.  IMPRESSION: 7.5 cm necrotic mass within the right lower lobe concerning for primary lung cancer.  Coronary artery disease.  These results will be called to the ordering clinician or representative by the Radiologist Assistant, and communication documented in the PACS or zVision Dashboard.   Electronically Signed   By: KRolm BaptiseM.D.   On: 04/26/2015 16:14   Nm Bone Scan Whole Body  05/12/2015   CLINICAL DATA:  Lung cancer.  Ankle and knee pain.  EXAM: NUCLEAR MEDICINE WHOLE BODY BONE SCAN  TECHNIQUE: Whole body anterior and posterior images were obtained approximately 3 hours after intravenous injection of radiopharmaceutical.  RADIOPHARMACEUTICALS:  23.38 mCi Technetium-931mDP IV  COMPARISON:  None.  FINDINGS: Bilateral renal function excretion is present. No focal bony abnormalities identified to suggest metastatic disease. Mild increase activity noted about the knees and ankles consistent degenerative change.  IMPRESSION: Mild increase activity noted about the knees and ankles consistent degenerative change. Bilateral knee and ankle series can be obtained for confirmation. No focal abnormality noted to suggest metastatic disease.   Electronically Signed   By: ThMarcello MooresRegister   On: 05/12/2015 12:25   Nm Pet Image Initial (pi) Skull Base To Thigh  05/03/2015   CLINICAL DATA:  Initial treatment strategy for Lung cancer.  EXAM: NUCLEAR MEDICINE PET SKULL BASE TO THIGH  TECHNIQUE: 11.7 mCi F-18 FDG was injected intravenously. Full-ring PET imaging was performed from the skull base to thigh after the radiotracer. CT  data was obtained and used for attenuation correction and anatomic localization.  FASTING BLOOD GLUCOSE:  Value: 88 mg/dl  COMPARISON:  04/26/2015  FINDINGS: NECK  No hypermetabolic lymph nodes in the neck.  CHEST  Necrotic mass in the right lower lobe measures 7.5 by 7.4 by 9.0 cm. The SUV max associated with this mass is equal to 12.1. Adjacent area of subsegmental atelectasis is noted within the posterior right lung base. Likely postobstructive. No additional hypermetabolic pulmonary nodules or mass is identified. The tumor extends and abuts the visceral pleural. There is increased uptake associated with the right hilar, sub- carinal and right paratracheal lymph nodes. SUV max associated with the right paratracheal lymph node is equal to 2.7. SUV max associated with the right hilar lymph node is equal to 3.2 an the FDG uptake associated with the sub- carinal node is equal to 2.68.  Heart size is normal. There is no pericardial effusion. Aortic atherosclerosis noted. There are calcifications within the LAD coronary artery.  ABDOMEN/PELVIS  No abnormal hypermetabolic activity within the liver, pancreas, adrenal glands, or spleen. Aortic atherosclerosis noted. No hypermetabolic lymph nodes in the abdomen or pelvis.  SKELETON  No focal hypermetabolic activity to suggest skeletal metastasis.  IMPRESSION: 1. Large right lower lobe lung mass is intensely hypermetabolic compatible with primary bronchogenic carcinoma. There is associated increased  uptake within the right hilar, sub- carinal and right paratracheal region. 2. Assuming non-small cell histology findings are suggestive of T3N2M0 disease or stage IIIa disease. Recommend correlation with tissue sampling including bronchoscopy with right paratracheal or sub- carinal lymph node sampling. 3. Three aortic atherosclerosis   Electronically Signed   By: Kerby Moors M.D.   On: 05/03/2015 14:47   US Venous Img Lower Bilateral  05/06/2015   CLINICAL DATA:  Bilateral  lower extremity edema. History of varicose veins. History of lung cancer. Evaluate for DVT.  EXAM: BILATERAL LOWER EXTREMITY VENOUS DOPPLER ULTRASOUND  TECHNIQUE: Gray-scale sonography with graded compression, as well as color Doppler and duplex ultrasound were performed to evaluate the lower extremity deep venous systems from the level of the common femoral vein and including the common femoral, femoral, profunda femoral, popliteal and calf veins including the posterior tibial, peroneal and gastrocnemius veins when visible. The superficial great saphenous vein was also interrogated. Spectral Doppler was utilized to evaluate flow at rest and with distal augmentation maneuvers in the common femoral, femoral and popliteal veins.  COMPARISON:  None.  FINDINGS: RIGHT LOWER EXTREMITY  Common Femoral Vein: No evidence of thrombus. Normal compressibility, respiratory phasicity and response to augmentation.  Saphenofemoral Junction: No evidence of thrombus. Normal compressibility and flow on color Doppler imaging.  Profunda Femoral Vein: No evidence of thrombus. Normal compressibility and flow on color Doppler imaging.  Femoral Vein: No evidence of thrombus. Normal compressibility, respiratory phasicity and response to augmentation.  Popliteal Vein: No evidence of thrombus. Normal compressibility, respiratory phasicity and response to augmentation.  Calf Veins: No evidence of thrombus. Normal compressibility and flow on color Doppler imaging.  Superficial Great Saphenous Vein: No evidence of thrombus. Normal compressibility and flow on color Doppler imaging.  Venous Reflux:  None.  Other Findings:  None.  LEFT LOWER EXTREMITY  Common Femoral Vein: No evidence of thrombus. Normal compressibility, respiratory phasicity and response to augmentation.  Saphenofemoral Junction: No evidence of thrombus. Normal compressibility and flow on color Doppler imaging.  Profunda Femoral Vein: No evidence of thrombus. Normal compressibility  and flow on color Doppler imaging.  Femoral Vein: No evidence of thrombus. Normal compressibility, respiratory phasicity and response to augmentation.  Popliteal Vein: No evidence of thrombus. Normal compressibility, respiratory phasicity and response to augmentation.  Calf Veins: No evidence of thrombus. Normal compressibility and flow on color Doppler imaging.  Superficial Great Saphenous Vein: No evidence of thrombus. Normal compressibility and flow on color Doppler imaging.  Venous Reflux:  None.  Other Findings:  None.  IMPRESSION: No evidence of DVT within either lower extremity.   Electronically Signed   By: Sandi Mariscal M.D.   On: 05/06/2015 13:49   Ct Biopsy  05/04/2015   CLINICAL DATA:  Right lower lobe lung mass  EXAM: CT-GUIDED BIOPSY OF A RIGHT LOWER LOBE LUNG MASS  MEDICATIONS AND MEDICAL HISTORY: Versed 2 mg, Fentanyl 75 mcg.  Additional Medications: None.  ANESTHESIA/SEDATION: Moderate sedation time: 17 minutes  PROCEDURE: The procedure, risks, benefits, and alternatives were explained to the patient. Questions regarding the procedure were encouraged and answered. The patient understands and consents to the procedure.  The right flank in the prone position was prepped with ChloraPrep in a sterile fashion, and a sterile drape was applied covering the operative field. A sterile gown and sterile gloves were used for the procedure.  Under CT guidance, a(n) 17 gauge guide needle was advanced into the right lower lobe lung mass. Subsequently 3 18 gauge  core biopsies were obtained. The guide needle was removed. Final imaging was performed.  Patient tolerated the procedure well without complication. Vital sign monitoring by nursing staff during the procedure will continue as patient is in the special procedures unit for post procedure observation.  FINDINGS: The images document guide needle placement within the right lower lobe lung mass. Post biopsy images demonstrate no pneumothorax.  COMPLICATIONS: None   IMPRESSION: Successful CT-guided core biopsy of a right lower lobe lung mass.   Electronically Signed   By: Marybelle Killings M.D.   On: 05/04/2015 11:11   Dg Chest Port 1 View  05/10/2015   CLINICAL DATA:  59 year old female post catheter placement. History of lung cancer. Subsequent encounter.  EXAM: PORTABLE CHEST - 1 VIEW  COMPARISON:  05/04/2015.  FINDINGS: Right central line tip mid to distal superior vena cava level. No pneumothorax detected.  Large mass projects over the right lower thorax.  Heart size within normal limits.  IMPRESSION: Right central line tip mid to distal superior vena cava level. No pneumothorax detected.  Large mass projects over the right lower thorax.   Electronically Signed   By: Genia Del M.D.   On: 05/10/2015 12:53   Dg C-arm 1-60 Min-no Report  05/10/2015   CLINICAL DATA: Port a cath insertion   C-ARM 1-60 MINUTES  Fluoroscopy was utilized by the requesting physician.  No radiographic  interpretation.     ASSESSMENT:  Non-small cell carcinoma of lung, squamous cell.  Right lower lobe T3 N2 M0 tumor stage IIIa PET scan has been reviewed Pathology has been reviewed Lower extremity Doppler studies negative for DVT Because of high alkaline phosphatase is a bone scan has been ordered  Bone scan has been reported to be negative. PLAN:   I had detailed discussion with the various options of therapy which includes primary surgery. [place] in excess been ordered for dry hacking cough Proceed with chemotherapy  All the side effects of chemotherapy including myelosuppression, alopecia, nausea vomiting fatigue weakness.  Secondary infection, and   peripheral neuropathy .  Has been discussed in details. Informal consent has been obtained and will be documented by nurses in the chart  Intent of chemotherapy is palliation and relief in symptoms and extending survival  Patient expressed understanding and was in agreement with this plan. She also understands that She can call  clinic at any time with any questions, concerns, or complaints.    No matching staging information was found for the patient.  Claudia Gleason, MD   05/20/2015 8:26 PM      [place] @ [facility] Telephone:([phone]  Fax:(336) [phone]

## 2015-05-20 NOTE — Progress Notes (Signed)
Patient does have living will.  Currently smokes. C/o cough that is keeping her awake.  Not resting well at all. Has to sleep in recliner due to pain in her back.  Cannot lay down in the bed.

## 2015-05-21 ENCOUNTER — Inpatient Hospital Stay: Payer: Managed Care, Other (non HMO)

## 2015-05-21 VITALS — BP 116/69 | HR 85 | Temp 98.3°F | Resp 18

## 2015-05-21 DIAGNOSIS — C3431 Malignant neoplasm of lower lobe, right bronchus or lung: Secondary | ICD-10-CM

## 2015-05-21 MED ORDER — PACLITAXEL CHEMO INJECTION 300 MG/50ML
175.0000 mg/m2 | Freq: Once | INTRAVENOUS | Status: AC
  Administered 2015-05-21: 252 mg via INTRAVENOUS
  Filled 2015-05-21: qty 42

## 2015-05-21 MED ORDER — HEPARIN SOD (PORK) LOCK FLUSH 100 UNIT/ML IV SOLN
500.0000 [IU] | Freq: Once | INTRAVENOUS | Status: DC | PRN
  Filled 2015-05-21: qty 5

## 2015-05-21 MED ORDER — SODIUM CHLORIDE 0.9 % IV SOLN
Freq: Once | INTRAVENOUS | Status: AC
  Administered 2015-05-21: 09:00:00 via INTRAVENOUS
  Filled 2015-05-21: qty 1000

## 2015-05-21 MED ORDER — PALONOSETRON HCL INJECTION 0.25 MG/5ML
0.2500 mg | Freq: Once | INTRAVENOUS | Status: AC
  Administered 2015-05-21: 0.25 mg via INTRAVENOUS
  Filled 2015-05-21: qty 5

## 2015-05-21 MED ORDER — FOSAPREPITANT DIMEGLUMINE INJECTION 150 MG
Freq: Once | INTRAVENOUS | Status: AC
  Administered 2015-05-21: 10:00:00 via INTRAVENOUS
  Filled 2015-05-21: qty 5

## 2015-05-21 MED ORDER — DIPHENHYDRAMINE HCL 50 MG/ML IJ SOLN
50.0000 mg | Freq: Once | INTRAMUSCULAR | Status: AC
  Administered 2015-05-21: 50 mg via INTRAVENOUS
  Filled 2015-05-21: qty 1

## 2015-05-21 MED ORDER — SODIUM CHLORIDE 0.9 % IV SOLN
410.0000 mg | Freq: Once | INTRAVENOUS | Status: AC
  Administered 2015-05-21: 410 mg via INTRAVENOUS
  Filled 2015-05-21: qty 41

## 2015-05-21 MED ORDER — PEGFILGRASTIM 6 MG/0.6ML ~~LOC~~ PSKT
6.0000 mg | PREFILLED_SYRINGE | Freq: Once | SUBCUTANEOUS | Status: AC
  Administered 2015-05-21: 6 mg via SUBCUTANEOUS
  Filled 2015-05-21: qty 0.6

## 2015-05-21 MED ORDER — FAMOTIDINE IN NACL 20-0.9 MG/50ML-% IV SOLN
20.0000 mg | Freq: Once | INTRAVENOUS | Status: AC
  Administered 2015-05-21: 20 mg via INTRAVENOUS
  Filled 2015-05-21: qty 50

## 2015-05-26 ENCOUNTER — Telehealth: Payer: Self-pay | Admitting: *Deleted

## 2015-05-26 NOTE — Telephone Encounter (Signed)
Patient is set up to receive in-home injections for neulasta. Prescription form will be faxed to our office and form will need to be returned to dispense neulasta injections.

## 2015-05-28 ENCOUNTER — Telehealth: Payer: Self-pay | Admitting: *Deleted

## 2015-05-28 ENCOUNTER — Inpatient Hospital Stay: Payer: Managed Care, Other (non HMO)

## 2015-05-28 DIAGNOSIS — C3431 Malignant neoplasm of lower lobe, right bronchus or lung: Secondary | ICD-10-CM

## 2015-05-28 DIAGNOSIS — C3490 Malignant neoplasm of unspecified part of unspecified bronchus or lung: Secondary | ICD-10-CM

## 2015-05-28 LAB — CBC WITH DIFFERENTIAL/PLATELET
BASOS PCT: 0 %
Band Neutrophils: 18 %
Basophils Absolute: 0 10*3/uL (ref 0–0.1)
EOS ABS: 0 10*3/uL (ref 0–0.7)
EOS PCT: 0 %
HCT: 35.5 % (ref 35.0–47.0)
HEMOGLOBIN: 12.1 g/dL (ref 12.0–16.0)
Lymphocytes Relative: 17 %
Lymphs Abs: 4.6 10*3/uL — ABNORMAL HIGH (ref 1.0–3.6)
MCH: 33.2 pg (ref 26.0–34.0)
MCHC: 34.1 g/dL (ref 32.0–36.0)
MCV: 97.2 fL (ref 80.0–100.0)
MONOS PCT: 11 %
Metamyelocytes Relative: 2 %
Monocytes Absolute: 3 10*3/uL — ABNORMAL HIGH (ref 0.2–0.9)
NEUTROS PCT: 52 %
Neutro Abs: 19.7 10*3/uL — ABNORMAL HIGH (ref 1.4–6.5)
PLATELETS: 382 10*3/uL (ref 150–440)
RBC: 3.65 MIL/uL — AB (ref 3.80–5.20)
RDW: 13.9 % (ref 11.5–14.5)
WBC: 27.3 10*3/uL — AB (ref 3.6–11.0)

## 2015-05-28 MED ORDER — LORAZEPAM 0.5 MG PO TABS: 0.5000 mg | ORAL_TABLET | Freq: Every day | ORAL | Status: DC

## 2015-05-28 NOTE — Telephone Encounter (Signed)
Per Magda Paganini, NP may start on lorazepam 0.'5mg'$  at bedtime to help with sleep. rx faxed to [facility] [address].

## 2015-06-04 ENCOUNTER — Inpatient Hospital Stay: Payer: Managed Care, Other (non HMO) | Attending: Cardiothoracic Surgery

## 2015-06-04 DIAGNOSIS — Z79899 Other long term (current) drug therapy: Secondary | ICD-10-CM | POA: Insufficient documentation

## 2015-06-04 DIAGNOSIS — F17210 Nicotine dependence, cigarettes, uncomplicated: Secondary | ICD-10-CM | POA: Diagnosis not present

## 2015-06-04 DIAGNOSIS — Z5111 Encounter for antineoplastic chemotherapy: Secondary | ICD-10-CM | POA: Insufficient documentation

## 2015-06-04 DIAGNOSIS — R5383 Other fatigue: Secondary | ICD-10-CM | POA: Diagnosis not present

## 2015-06-04 DIAGNOSIS — R05 Cough: Secondary | ICD-10-CM | POA: Diagnosis not present

## 2015-06-04 DIAGNOSIS — C3431 Malignant neoplasm of lower lobe, right bronchus or lung: Secondary | ICD-10-CM | POA: Diagnosis present

## 2015-06-04 DIAGNOSIS — R531 Weakness: Secondary | ICD-10-CM | POA: Insufficient documentation

## 2015-06-04 LAB — CBC WITH DIFFERENTIAL/PLATELET
Basophils Absolute: 0.1 10*3/uL (ref 0–0.1)
Basophils Relative: 1 %
Eosinophils Absolute: 0.1 10*3/uL (ref 0–0.7)
Eosinophils Relative: 0 %
HEMATOCRIT: 34.8 % — AB (ref 35.0–47.0)
Hemoglobin: 11.8 g/dL — ABNORMAL LOW (ref 12.0–16.0)
LYMPHS ABS: 1.9 10*3/uL (ref 1.0–3.6)
LYMPHS PCT: 12 %
MCH: 33 pg (ref 26.0–34.0)
MCHC: 33.8 g/dL (ref 32.0–36.0)
MCV: 97.5 fL (ref 80.0–100.0)
Monocytes Absolute: 0.7 10*3/uL (ref 0.2–0.9)
Monocytes Relative: 5 %
NEUTROS PCT: 82 %
Neutro Abs: 12.5 10*3/uL — ABNORMAL HIGH (ref 1.4–6.5)
PLATELETS: 252 10*3/uL (ref 150–440)
RBC: 3.57 MIL/uL — AB (ref 3.80–5.20)
RDW: 14.6 % — AB (ref 11.5–14.5)
WBC: 15.2 10*3/uL — AB (ref 3.6–11.0)

## 2015-06-10 ENCOUNTER — Inpatient Hospital Stay: Payer: Managed Care, Other (non HMO)

## 2015-06-10 ENCOUNTER — Inpatient Hospital Stay (HOSPITAL_BASED_OUTPATIENT_CLINIC_OR_DEPARTMENT_OTHER): Payer: Managed Care, Other (non HMO) | Admitting: Oncology

## 2015-06-10 VITALS — BP 152/77 | HR 89 | Temp 97.1°F | Wt 109.4 lb

## 2015-06-10 DIAGNOSIS — C3431 Malignant neoplasm of lower lobe, right bronchus or lung: Secondary | ICD-10-CM

## 2015-06-10 DIAGNOSIS — R5383 Other fatigue: Secondary | ICD-10-CM

## 2015-06-10 DIAGNOSIS — Z79899 Other long term (current) drug therapy: Secondary | ICD-10-CM

## 2015-06-10 DIAGNOSIS — F17210 Nicotine dependence, cigarettes, uncomplicated: Secondary | ICD-10-CM

## 2015-06-10 DIAGNOSIS — R531 Weakness: Secondary | ICD-10-CM

## 2015-06-10 DIAGNOSIS — R05 Cough: Secondary | ICD-10-CM | POA: Diagnosis not present

## 2015-06-10 LAB — MAGNESIUM: Magnesium: 1.9 mg/dL (ref 1.7–2.4)

## 2015-06-10 LAB — CBC WITH DIFFERENTIAL/PLATELET
BASOS PCT: 1 %
Basophils Absolute: 0.1 10*3/uL (ref 0–0.1)
Eosinophils Absolute: 0.1 10*3/uL (ref 0–0.7)
Eosinophils Relative: 1 %
HEMATOCRIT: 33.7 % — AB (ref 35.0–47.0)
HEMOGLOBIN: 11.5 g/dL — AB (ref 12.0–16.0)
LYMPHS ABS: 1.9 10*3/uL (ref 1.0–3.6)
Lymphocytes Relative: 20 %
MCH: 33.7 pg (ref 26.0–34.0)
MCHC: 34 g/dL (ref 32.0–36.0)
MCV: 98.9 fL (ref 80.0–100.0)
MONOS PCT: 7 %
Monocytes Absolute: 0.7 10*3/uL (ref 0.2–0.9)
NEUTROS ABS: 6.5 10*3/uL (ref 1.4–6.5)
NEUTROS PCT: 71 %
Platelets: 363 10*3/uL (ref 150–440)
RBC: 3.41 MIL/uL — ABNORMAL LOW (ref 3.80–5.20)
RDW: 14.9 % — ABNORMAL HIGH (ref 11.5–14.5)
WBC: 9.3 10*3/uL (ref 3.6–11.0)

## 2015-06-10 LAB — COMPREHENSIVE METABOLIC PANEL
ALK PHOS: 174 U/L — AB (ref 38–126)
ALT: 19 U/L (ref 14–54)
AST: 18 U/L (ref 15–41)
Albumin: 3.5 g/dL (ref 3.5–5.0)
Anion gap: 8 (ref 5–15)
BUN: 9 mg/dL (ref 6–20)
CALCIUM: 8.8 mg/dL — AB (ref 8.9–10.3)
CO2: 25 mmol/L (ref 22–32)
CREATININE: 0.38 mg/dL — AB (ref 0.44–1.00)
Chloride: 107 mmol/L (ref 101–111)
GFR calc non Af Amer: >60 mL/min (ref >60)
Glucose, Bld: 95 mg/dL (ref 65–99)
Potassium: 3.9 mmol/L (ref 3.5–5.1)
SODIUM: 140 mmol/L (ref 135–145)
Total Bilirubin: 0.4 mg/dL (ref 0.3–1.2)
Total Protein: 6.9 g/dL (ref 6.5–8.1)

## 2015-06-10 NOTE — Progress Notes (Signed)
Patient does have living will.  Currently smokes.  Feet and legs swelling.

## 2015-06-11 ENCOUNTER — Inpatient Hospital Stay: Payer: Managed Care, Other (non HMO)

## 2015-06-11 ENCOUNTER — Encounter: Payer: Self-pay | Admitting: Oncology

## 2015-06-11 VITALS — BP 126/77 | HR 89 | Temp 97.6°F | Resp 18

## 2015-06-11 DIAGNOSIS — C3431 Malignant neoplasm of lower lobe, right bronchus or lung: Secondary | ICD-10-CM

## 2015-06-11 MED ORDER — DIPHENHYDRAMINE HCL 50 MG/ML IJ SOLN
50.0000 mg | Freq: Once | INTRAMUSCULAR | Status: AC
  Administered 2015-06-11: 50 mg via INTRAVENOUS
  Filled 2015-06-11: qty 1

## 2015-06-11 MED ORDER — PEGFILGRASTIM 6 MG/0.6ML ~~LOC~~ PSKT: 6.0000 mg | PREFILLED_SYRINGE | Freq: Once | SUBCUTANEOUS | Status: DC

## 2015-06-11 MED ORDER — SODIUM CHLORIDE 0.9 % IV SOLN
Freq: Once | INTRAVENOUS | Status: AC
  Administered 2015-06-11: 11:00:00 via INTRAVENOUS
  Filled 2015-06-11: qty 5

## 2015-06-11 MED ORDER — PALONOSETRON HCL INJECTION 0.25 MG/5ML
0.2500 mg | Freq: Once | INTRAVENOUS | Status: AC
  Administered 2015-06-11: 0.25 mg via INTRAVENOUS
  Filled 2015-06-11: qty 5

## 2015-06-11 MED ORDER — SODIUM CHLORIDE 0.9 % IV SOLN
Freq: Once | INTRAVENOUS | Status: AC
  Administered 2015-06-11: 10:00:00 via INTRAVENOUS
  Filled 2015-06-11: qty 1000

## 2015-06-11 MED ORDER — SODIUM CHLORIDE 0.9 % IJ SOLN
10.0000 mL | INTRAMUSCULAR | Status: DC | PRN
  Administered 2015-06-11: 10 mL
  Filled 2015-06-11: qty 10

## 2015-06-11 MED ORDER — PACLITAXEL CHEMO INJECTION 300 MG/50ML
175.0000 mg/m2 | Freq: Once | INTRAVENOUS | Status: AC
  Administered 2015-06-11: 252 mg via INTRAVENOUS
  Filled 2015-06-11: qty 42

## 2015-06-11 MED ORDER — HEPARIN SOD (PORK) LOCK FLUSH 100 UNIT/ML IV SOLN
500.0000 [IU] | Freq: Once | INTRAVENOUS | Status: AC | PRN
  Administered 2015-06-11: 500 [IU]
  Filled 2015-06-11: qty 5

## 2015-06-11 MED ORDER — FAMOTIDINE IN NACL 20-0.9 MG/50ML-% IV SOLN
20.0000 mg | Freq: Once | INTRAVENOUS | Status: AC
  Administered 2015-06-11: 20 mg via INTRAVENOUS
  Filled 2015-06-11: qty 50

## 2015-06-11 MED ORDER — SODIUM CHLORIDE 0.9 % IV SOLN
410.0000 mg | Freq: Once | INTRAVENOUS | Status: AC
  Administered 2015-06-11: 410 mg via INTRAVENOUS
  Filled 2015-06-11: qty 41

## 2015-06-11 NOTE — Progress Notes (Signed)
[place] @ [facility] Telephone:(336) [phone]  Fax:(336) [phone]   INITIAL CONSULT  Claudia Chavez OB: 20-Feb-1956  MR#: [id number]  UDJ#:[id number]  Patient Care Team: Maryland Pink, MD as PCP - General (Family Medicine)  CHIEF COMPLAINT:  1.  Squamous cell carcinoma of right  LOWER LOBE OF  lung stage IIIA based on PET scan Biopsy of the (August of 2016)   VISIT DIAGNOSIS:     ICD-9-CM ICD-10-CM   1. Cancer of lower lobe of right lung 162.5 C34.31 Comprehensive metabolic panel     Magnesium    CANCER  of lung  No history exists.    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  Started having cough and congestion.  Dry hacking cough.  No hemoptysis.  No chest pain.  Initial antibiotic therapy was done but hacking cough continues with chest x-ray was done which was abnormal and CT scan was done on JULY  25th which revealed 7.5 cm mass in the right lower lobe Patient was referred to me for further evaluation and treatment consideration.  May 20, 2015 Patient continues just revere dry hacking cough.  Cannot lie down in the night.  Because of continuous cough.  Patient was given seen next to try the nighttime.  Patient had a port placement and here for further evaluation regarding neoadjuvant chemotherapy with carboplatinum and Taxol. Patient has received chemotherapy education and has number of questions June 10, 2015 Patient is here to initiate second cycle of chemotherapy tolerated first treatment very well.  She has not noticed any difference in the cough.  No chills.  No fever.  HEENT.  No numbness.  Loss of hair. REVIEW OF SYSTEMS:    general status: Patient is feeling weak and tired.  No change in a performance status.  No chills.  No fever. HEENT: .  No evidence of stomatitis Lungs: No cough or shortness of breath Cardiac: No chest pain or paroxysmal nocturnal dyspnea GI: No  nausea no vomiting no diarrhea no abdominal pain Skin: No rash Lower extremity no swelling Neurological system: No tingling.  No numbness.  No other focal signs Musculoskeletal system no bony pains  Review of systems:  All other systems reviewed and found to be negative.  As per HPI. Otherwise, a complete review of systems is negatve.  PAST MEDICAL HISTORY: No significant past medical history.  Denies any diabetes or hypertensio occasional joint pains recently had some swelling of the left ankle area ultrasound was negative for deep vein thrombosis PAST SURGICAL HISTORY: Patient had a hysterectomy.  No other significant past history FAMILY HISTORY There is no significant family history of breast cancer, ovarian cancer, colon cancer GYNECOLOGIC HISTORY:  No LMP recorded. Patient has had a hysterectomy.     ADVANCED DIRECTIVES:  Patient does have advance healthcare directive, Patient   does not desire to make any changes  HEALTH MAINTENANCE: Social History  Substance Use Topics  . Smoking status: Current Every Day Smoker -- 1.00 packs/day for 40 years    Types: Cigarettes  . Smokeless tobacco: None     Comment: "1 pk or less per day"  . Alcohol Use: 0.0 oz/week    0 Standard drinks or equivalent per week     Comment: beer-occasionally      No Known Allergies  Current Outpatient Prescriptions  Medication Sig Dispense Refill  . albuterol (PROAIR HFA) 108 (90 BASE) MCG/ACT inhaler Inhale into the  lungs.    . chlorpheniramine-HYDROcodone (TUSSIONEX PENNKINETIC ER) 10-8 MG/5ML SUER Take 5 mLs by mouth at bedtime as needed for cough. 140 mL 0  . dexamethasone (DECADRON) 4 MG tablet Take 2 tabs BID the day before chemotherapy then take 2 tabs BID the day after chemotherapy. 30 tablet 1  . lidocaine-prilocaine (EMLA) cream Apply to affected area once 30 g 3  . LORazepam (ATIVAN) 0.5 MG tablet Take 1 tablet (0.5 mg total) by mouth at bedtime. Take for inability to sleep. 30 tablet 0    . mometasone-formoterol (DULERA) 200-5 MCG/ACT AERO Use 2 puffs two times daily    . ondansetron (ZOFRAN) 8 MG tablet Take 1 tablet (8 mg total) by mouth 2 (two) times daily. Start the day after chemo for 3 days. Then take as needed for nausea or vomiting. 30 tablet 1  . prochlorperazine (COMPAZINE) 10 MG tablet Take 1 tablet (10 mg total) by mouth every 6 (six) hours as needed (Nausea or vomiting). 30 tablet 1  . traMADol (ULTRAM) 50 MG tablet Take 0.5 tablets (25 mg total) by mouth every 6 (six) hours as needed. 30 tablet 0  . Vitamin D, Ergocalciferol, (DRISDOL) 50000 UNITS CAPS capsule Take by mouth.     No current facility-administered medications for this visit.   Facility-Administered Medications Ordered in Other Visits  Medication Dose Route Frequency Provider Last Rate Last Dose  . heparin lock flush 100 unit/mL  500 Units Intracatheter Once PRN Forest Gleason, MD      . pegfilgrastim (NEULASTA ONPRO KIT) injection 6 mg  6 mg Subcutaneous Once Forest Gleason, MD   6 mg at 06/11/15 0949  . sodium chloride 0.9 % injection 10 mL  10 mL Intracatheter PRN Forest Gleason, MD   10 mL at 06/11/15 0945    OBJECTIVE: PHYSICAL EXAM: GENERAL:  Well developed, well nourished, sitting comfortably in the exam room in no acute distress. MENTAL STATUS:  Alert and oriented to person, place and time.  ENT:  Oropharynx clear without lesion.  Tongue normal. Mucous membranes moist.  RESPIRATORY:  Clear to auscultation without rales, wheezes or rhonchi. CARDIOVASCULAR:  Regular rate and rhythm without murmur, rub or gallop. BREAST:  Right breast without masses, skin changes or nipple discharge.  Left breast without masses, skin changes or nipple discharge. ABDOMEN:  Soft, non-tender, with active bowel sounds, and no hepatosplenomegaly.  No masses. BACK:  No CVA tenderness.  No tenderness on percussion of the back or rib cage. SKIN:  No rashes, ulcers or lesions. EXTREMITIES: No edema, no skin discoloration  or tenderness.  No palpable cords. LYMPH NODES: No palpable cervical, supraclavicular, axillary or inguinal adenopathy  NEUROLOGICAL: Unremarkable. PSYCH:  Appropriate.                     Lab data CBC: 9.3 Neutrophil count 6.5 Hemoglobin 11.5 Hematocrit 33.7 Creatinine 0.8   ASSESSMENT:  Non-small cell carcinoma of lung, squamous cell.  Right lower lobe T3 N2 M0 tumor stage IIIa PET scan has been reviewed Pathology has been reviewed Lower extremity Doppler studies negative for DVT Because of high alkaline phosphatase is a bone scan has been ordered  Bone scan has been reported to be negative. PLAN:    Proceed with second cycle of chemotherapy.  Neoadjuvant chemotherapy with carboplatin and Taxol.  After 3 cycles of chemotherapy a CT scan would be done   No matching staging information was found for the patient.  Forest Gleason, MD   06/11/2015  4:29 PM      [place] @ [facility] Telephone:(336) [phone]  Fax:(336) [phone]

## 2015-06-17 ENCOUNTER — Other Ambulatory Visit: Payer: Managed Care, Other (non HMO)

## 2015-06-18 ENCOUNTER — Inpatient Hospital Stay: Payer: Managed Care, Other (non HMO)

## 2015-06-18 DIAGNOSIS — C3431 Malignant neoplasm of lower lobe, right bronchus or lung: Secondary | ICD-10-CM

## 2015-06-18 LAB — CBC WITH DIFFERENTIAL/PLATELET
Basophils Absolute: 0 10*3/uL (ref 0–0.1)
Basophils Relative: 0 %
Eosinophils Absolute: 0.2 10*3/uL (ref 0–0.7)
Eosinophils Relative: 1 %
HEMATOCRIT: 34.4 % — AB (ref 35.0–47.0)
HEMOGLOBIN: 11.3 g/dL — AB (ref 12.0–16.0)
LYMPHS ABS: 2.7 10*3/uL (ref 1.0–3.6)
Lymphocytes Relative: 12 %
MCH: 32.7 pg (ref 26.0–34.0)
MCHC: 32.9 g/dL (ref 32.0–36.0)
MCV: 99.4 fL (ref 80.0–100.0)
MONOS PCT: 5 %
Monocytes Absolute: 1 10*3/uL — ABNORMAL HIGH (ref 0.2–0.9)
NEUTROS ABS: 18.7 10*3/uL — AB (ref 1.4–6.5)
NEUTROS PCT: 82 %
Platelets: 364 10*3/uL (ref 150–440)
RBC: 3.46 MIL/uL — ABNORMAL LOW (ref 3.80–5.20)
RDW: 15.1 % — ABNORMAL HIGH (ref 11.5–14.5)
WBC: 22.7 10*3/uL — ABNORMAL HIGH (ref 3.6–11.0)

## 2015-06-24 ENCOUNTER — Other Ambulatory Visit: Payer: Managed Care, Other (non HMO)

## 2015-06-25 ENCOUNTER — Inpatient Hospital Stay: Payer: Managed Care, Other (non HMO)

## 2015-06-25 DIAGNOSIS — C3431 Malignant neoplasm of lower lobe, right bronchus or lung: Secondary | ICD-10-CM

## 2015-06-25 LAB — CBC WITH DIFFERENTIAL/PLATELET
BASOS PCT: 1 %
Basophils Absolute: 0.1 10*3/uL (ref 0–0.1)
EOS ABS: 0.1 10*3/uL (ref 0–0.7)
EOS PCT: 0 %
HCT: 35.2 % (ref 35.0–47.0)
Hemoglobin: 11.6 g/dL — ABNORMAL LOW (ref 12.0–16.0)
Lymphocytes Relative: 17 %
Lymphs Abs: 2.3 10*3/uL (ref 1.0–3.6)
MCH: 33.2 pg (ref 26.0–34.0)
MCHC: 33 g/dL (ref 32.0–36.0)
MCV: 100.5 fL — ABNORMAL HIGH (ref 80.0–100.0)
MONO ABS: 0.8 10*3/uL (ref 0.2–0.9)
MONOS PCT: 6 %
Neutro Abs: 10.8 10*3/uL — ABNORMAL HIGH (ref 1.4–6.5)
Neutrophils Relative %: 76 %
PLATELETS: 327 10*3/uL (ref 150–440)
RBC: 3.5 MIL/uL — ABNORMAL LOW (ref 3.80–5.20)
RDW: 16.5 % — AB (ref 11.5–14.5)
WBC: 14.1 10*3/uL — ABNORMAL HIGH (ref 3.6–11.0)

## 2015-07-01 ENCOUNTER — Inpatient Hospital Stay: Payer: Managed Care, Other (non HMO)

## 2015-07-01 ENCOUNTER — Inpatient Hospital Stay: Payer: Managed Care, Other (non HMO) | Admitting: Oncology

## 2015-07-02 ENCOUNTER — Inpatient Hospital Stay: Payer: Managed Care, Other (non HMO) | Admitting: Family Medicine

## 2015-07-02 ENCOUNTER — Inpatient Hospital Stay: Payer: Managed Care, Other (non HMO) | Attending: Family Medicine

## 2015-07-02 ENCOUNTER — Encounter: Payer: Self-pay | Admitting: Oncology

## 2015-07-02 ENCOUNTER — Other Ambulatory Visit: Payer: Self-pay | Admitting: *Deleted

## 2015-07-02 ENCOUNTER — Inpatient Hospital Stay: Payer: Managed Care, Other (non HMO)

## 2015-07-02 ENCOUNTER — Other Ambulatory Visit: Payer: Self-pay | Admitting: Family Medicine

## 2015-07-02 VITALS — BP 124/73 | HR 78 | Temp 96.6°F | Resp 18

## 2015-07-02 DIAGNOSIS — C3431 Malignant neoplasm of lower lobe, right bronchus or lung: Secondary | ICD-10-CM | POA: Diagnosis not present

## 2015-07-02 LAB — MAGNESIUM: Magnesium: 1.8 mg/dL (ref 1.7–2.4)

## 2015-07-02 LAB — COMPREHENSIVE METABOLIC PANEL
ALT: 15 U/L (ref 14–54)
AST: 18 U/L (ref 15–41)
Albumin: 3.7 g/dL (ref 3.5–5.0)
Alkaline Phosphatase: 140 U/L — ABNORMAL HIGH (ref 38–126)
Anion gap: 7 (ref 5–15)
BUN: 12 mg/dL (ref 6–20)
CHLORIDE: 104 mmol/L (ref 101–111)
CO2: 26 mmol/L (ref 22–32)
CREATININE: 0.61 mg/dL (ref 0.44–1.00)
Calcium: 8.5 mg/dL — ABNORMAL LOW (ref 8.9–10.3)
GFR calc non Af Amer: >60 mL/min (ref >60)
Glucose, Bld: 112 mg/dL — ABNORMAL HIGH (ref 65–99)
POTASSIUM: 3.6 mmol/L (ref 3.5–5.1)
SODIUM: 137 mmol/L (ref 135–145)
Total Bilirubin: 0.4 mg/dL (ref 0.3–1.2)
Total Protein: 7 g/dL (ref 6.5–8.1)

## 2015-07-02 LAB — CBC WITH DIFFERENTIAL/PLATELET
BASOS ABS: 0.1 10*3/uL (ref 0–0.1)
Basophils Relative: 1 %
EOS ABS: 0.1 10*3/uL (ref 0–0.7)
EOS PCT: 1 %
HCT: 33.7 % — ABNORMAL LOW (ref 35.0–47.0)
Hemoglobin: 11.5 g/dL — ABNORMAL LOW (ref 12.0–16.0)
Lymphocytes Relative: 26 %
Lymphs Abs: 2.1 10*3/uL (ref 1.0–3.6)
MCH: 34.2 pg — ABNORMAL HIGH (ref 26.0–34.0)
MCHC: 34.2 g/dL (ref 32.0–36.0)
MCV: 100.1 fL — ABNORMAL HIGH (ref 80.0–100.0)
Monocytes Absolute: 0.7 10*3/uL (ref 0.2–0.9)
Monocytes Relative: 9 %
Neutro Abs: 5.4 10*3/uL (ref 1.4–6.5)
Neutrophils Relative %: 63 %
PLATELETS: 330 10*3/uL (ref 150–440)
RBC: 3.37 MIL/uL — AB (ref 3.80–5.20)
RDW: 17.2 % — ABNORMAL HIGH (ref 11.5–14.5)
WBC: 8.4 10*3/uL (ref 3.6–11.0)

## 2015-07-02 MED ORDER — HEPARIN SOD (PORK) LOCK FLUSH 100 UNIT/ML IV SOLN
500.0000 [IU] | Freq: Once | INTRAVENOUS | Status: AC | PRN
  Administered 2015-07-02: 500 [IU]
  Filled 2015-07-02: qty 5

## 2015-07-02 MED ORDER — SODIUM CHLORIDE 0.9 % IJ SOLN
10.0000 mL | INTRAMUSCULAR | Status: DC | PRN
  Filled 2015-07-02: qty 10

## 2015-07-02 MED ORDER — FAMOTIDINE IN NACL 20-0.9 MG/50ML-% IV SOLN
20.0000 mg | Freq: Once | INTRAVENOUS | Status: AC
  Administered 2015-07-02: 20 mg via INTRAVENOUS
  Filled 2015-07-02: qty 50

## 2015-07-02 MED ORDER — PACLITAXEL CHEMO INJECTION 300 MG/50ML
175.0000 mg/m2 | Freq: Once | INTRAVENOUS | Status: AC
  Administered 2015-07-02: 252 mg via INTRAVENOUS
  Filled 2015-07-02: qty 42

## 2015-07-02 MED ORDER — PALONOSETRON HCL INJECTION 0.25 MG/5ML
0.2500 mg | Freq: Once | INTRAVENOUS | Status: AC
  Administered 2015-07-02: 0.25 mg via INTRAVENOUS
  Filled 2015-07-02: qty 5

## 2015-07-02 MED ORDER — DIPHENHYDRAMINE HCL 50 MG/ML IJ SOLN
50.0000 mg | Freq: Once | INTRAMUSCULAR | Status: AC
  Administered 2015-07-02: 50 mg via INTRAVENOUS
  Filled 2015-07-02: qty 1

## 2015-07-02 MED ORDER — SODIUM CHLORIDE 0.9 % IV SOLN
Freq: Once | INTRAVENOUS | Status: AC
  Administered 2015-07-02: 11:00:00 via INTRAVENOUS
  Filled 2015-07-02: qty 1000

## 2015-07-02 MED ORDER — SODIUM CHLORIDE 0.9 % IV SOLN
410.0000 mg | Freq: Once | INTRAVENOUS | Status: AC
  Administered 2015-07-02: 410 mg via INTRAVENOUS
  Filled 2015-07-02: qty 41

## 2015-07-02 MED ORDER — SODIUM CHLORIDE 0.9 % IV SOLN
Freq: Once | INTRAVENOUS | Status: AC
  Administered 2015-07-02: 11:00:00 via INTRAVENOUS
  Filled 2015-07-02: qty 5

## 2015-07-02 MED ORDER — PEGFILGRASTIM 6 MG/0.6ML ~~LOC~~ PSKT: 6.0000 mg | PREFILLED_SYRINGE | Freq: Once | SUBCUTANEOUS | Status: DC

## 2015-07-08 ENCOUNTER — Ambulatory Visit
Admission: RE | Admit: 2015-07-08 | Discharge: 2015-07-08 | Disposition: A | Discharge status: Home / Self Care | Payer: Managed Care, Other (non HMO) | Source: Ambulatory Visit | Attending: Family Medicine | Admitting: Family Medicine

## 2015-07-08 DIAGNOSIS — C3431 Malignant neoplasm of lower lobe, right bronchus or lung: Secondary | ICD-10-CM | POA: Insufficient documentation

## 2015-07-08 LAB — GLUCOSE, CAPILLARY: Glucose-Capillary: 88 mg/dL (ref 65–99)

## 2015-07-08 MED ORDER — FLUDEOXYGLUCOSE F - 18 (FDG) INJECTION
12.0000 | Freq: Once | INTRAVENOUS | Status: DC | PRN
  Administered 2015-07-08: 11.24 via INTRAVENOUS
  Filled 2015-07-08: qty 12

## 2015-07-09 ENCOUNTER — Inpatient Hospital Stay: Payer: Managed Care, Other (non HMO) | Attending: Oncology

## 2015-07-09 DIAGNOSIS — R0602 Shortness of breath: Secondary | ICD-10-CM | POA: Diagnosis not present

## 2015-07-09 DIAGNOSIS — R05 Cough: Secondary | ICD-10-CM | POA: Diagnosis not present

## 2015-07-09 DIAGNOSIS — C3431 Malignant neoplasm of lower lobe, right bronchus or lung: Secondary | ICD-10-CM | POA: Diagnosis not present

## 2015-07-09 DIAGNOSIS — Z5111 Encounter for antineoplastic chemotherapy: Secondary | ICD-10-CM | POA: Diagnosis not present

## 2015-07-09 DIAGNOSIS — R531 Weakness: Secondary | ICD-10-CM | POA: Insufficient documentation

## 2015-07-09 DIAGNOSIS — R5383 Other fatigue: Secondary | ICD-10-CM | POA: Insufficient documentation

## 2015-07-09 DIAGNOSIS — F17210 Nicotine dependence, cigarettes, uncomplicated: Secondary | ICD-10-CM | POA: Insufficient documentation

## 2015-07-09 DIAGNOSIS — Z79899 Other long term (current) drug therapy: Secondary | ICD-10-CM | POA: Insufficient documentation

## 2015-07-09 LAB — CBC WITH DIFFERENTIAL/PLATELET
Basophils Absolute: 0.2 10*3/uL — ABNORMAL HIGH (ref 0–0.1)
Basophils Relative: 1 %
Eosinophils Absolute: 0.3 10*3/uL (ref 0–0.7)
Eosinophils Relative: 1 %
HEMATOCRIT: 33.9 % — AB (ref 35.0–47.0)
HEMOGLOBIN: 11.6 g/dL — AB (ref 12.0–16.0)
LYMPHS ABS: 3.6 10*3/uL (ref 1.0–3.6)
Lymphocytes Relative: 13 %
MCH: 33.9 pg (ref 26.0–34.0)
MCHC: 34.1 g/dL (ref 32.0–36.0)
MCV: 99.5 fL (ref 80.0–100.0)
MONOS PCT: 6 %
Monocytes Absolute: 1.7 10*3/uL — ABNORMAL HIGH (ref 0.2–0.9)
NEUTROS ABS: 21.5 10*3/uL — AB (ref 1.4–6.5)
NEUTROS PCT: 79 %
Platelets: 276 10*3/uL (ref 150–440)
RBC: 3.41 MIL/uL — ABNORMAL LOW (ref 3.80–5.20)
RDW: 17.7 % — ABNORMAL HIGH (ref 11.5–14.5)
WBC: 27.3 10*3/uL — ABNORMAL HIGH (ref 3.6–11.0)

## 2015-07-16 ENCOUNTER — Telehealth: Payer: Self-pay | Admitting: *Deleted

## 2015-07-16 ENCOUNTER — Inpatient Hospital Stay: Payer: Managed Care, Other (non HMO)

## 2015-07-16 ENCOUNTER — Other Ambulatory Visit: Payer: Self-pay | Admitting: Family Medicine

## 2015-07-16 DIAGNOSIS — C3431 Malignant neoplasm of lower lobe, right bronchus or lung: Secondary | ICD-10-CM | POA: Diagnosis not present

## 2015-07-16 LAB — CBC WITH DIFFERENTIAL/PLATELET
BASOS ABS: 0.1 10*3/uL (ref 0–0.1)
BASOS PCT: 1 %
Eosinophils Absolute: 0.1 10*3/uL (ref 0–0.7)
Eosinophils Relative: 1 %
HEMATOCRIT: 33.7 % — AB (ref 35.0–47.0)
HEMOGLOBIN: 11.3 g/dL — AB (ref 12.0–16.0)
LYMPHS PCT: 17 %
Lymphs Abs: 2.2 10*3/uL (ref 1.0–3.6)
MCH: 34 pg (ref 26.0–34.0)
MCHC: 33.6 g/dL (ref 32.0–36.0)
MCV: 101.1 fL — AB (ref 80.0–100.0)
Monocytes Absolute: 0.6 10*3/uL (ref 0.2–0.9)
Monocytes Relative: 4 %
NEUTROS ABS: 10.3 10*3/uL — AB (ref 1.4–6.5)
NEUTROS PCT: 77 %
Platelets: 271 10*3/uL (ref 150–440)
RBC: 3.34 MIL/uL — AB (ref 3.80–5.20)
RDW: 18.9 % — AB (ref 11.5–14.5)
WBC: 13.3 10*3/uL — AB (ref 3.6–11.0)

## 2015-07-16 MED ORDER — SULFAMETHOXAZOLE-TRIMETHOPRIM 800-160 MG PO TABS: 1.0000 | ORAL_TABLET | Freq: Two times a day (BID) | ORAL | Status: DC

## 2015-07-16 NOTE — Telephone Encounter (Signed)
Pt has cut on right index finger that has caused tip of finger to swell with redness and pain. Pt report no drainage from area. Per Magda Paganini, pt needs to soak finger in epsom salt and an antibiotic will be called into her pharmacy. Pt verbalized understanding.

## 2015-07-22 ENCOUNTER — Other Ambulatory Visit: Payer: Managed Care, Other (non HMO)

## 2015-07-22 ENCOUNTER — Inpatient Hospital Stay: Payer: Managed Care, Other (non HMO)

## 2015-07-22 ENCOUNTER — Ambulatory Visit: Payer: Managed Care, Other (non HMO)

## 2015-07-22 ENCOUNTER — Ambulatory Visit: Payer: Managed Care, Other (non HMO) | Admitting: Oncology

## 2015-07-22 ENCOUNTER — Inpatient Hospital Stay (HOSPITAL_BASED_OUTPATIENT_CLINIC_OR_DEPARTMENT_OTHER): Payer: Managed Care, Other (non HMO) | Admitting: Oncology

## 2015-07-22 VITALS — BP 136/77 | HR 77 | Temp 96.6°F | Wt 111.3 lb

## 2015-07-22 DIAGNOSIS — R0602 Shortness of breath: Secondary | ICD-10-CM

## 2015-07-22 DIAGNOSIS — C3431 Malignant neoplasm of lower lobe, right bronchus or lung: Secondary | ICD-10-CM

## 2015-07-22 DIAGNOSIS — R05 Cough: Secondary | ICD-10-CM

## 2015-07-22 DIAGNOSIS — Z79899 Other long term (current) drug therapy: Secondary | ICD-10-CM

## 2015-07-22 DIAGNOSIS — R531 Weakness: Secondary | ICD-10-CM

## 2015-07-22 DIAGNOSIS — R5383 Other fatigue: Secondary | ICD-10-CM

## 2015-07-22 DIAGNOSIS — F17210 Nicotine dependence, cigarettes, uncomplicated: Secondary | ICD-10-CM

## 2015-07-22 LAB — COMPREHENSIVE METABOLIC PANEL
ALK PHOS: 128 U/L — AB (ref 38–126)
ALT: 18 U/L (ref 14–54)
AST: 25 U/L (ref 15–41)
Albumin: 4.1 g/dL (ref 3.5–5.0)
Anion gap: 5 (ref 5–15)
BUN: 11 mg/dL (ref 6–20)
CHLORIDE: 104 mmol/L (ref 101–111)
CO2: 27 mmol/L (ref 22–32)
CREATININE: 0.59 mg/dL (ref 0.44–1.00)
Calcium: 9.1 mg/dL (ref 8.9–10.3)
GFR calc Af Amer: >60 mL/min (ref >60)
GLUCOSE: 118 mg/dL — AB (ref 65–99)
POTASSIUM: 3.7 mmol/L (ref 3.5–5.1)
Sodium: 136 mmol/L (ref 135–145)
Total Bilirubin: 0.3 mg/dL (ref 0.3–1.2)
Total Protein: 7.3 g/dL (ref 6.5–8.1)

## 2015-07-22 LAB — CBC WITH DIFFERENTIAL/PLATELET
Basophils Absolute: 0.1 10*3/uL (ref 0–0.1)
Basophils Relative: 1 %
EOS PCT: 2 %
Eosinophils Absolute: 0.1 10*3/uL (ref 0–0.7)
HEMATOCRIT: 34.9 % — AB (ref 35.0–47.0)
Hemoglobin: 11.8 g/dL — ABNORMAL LOW (ref 12.0–16.0)
LYMPHS PCT: 28 %
Lymphs Abs: 1.8 10*3/uL (ref 1.0–3.6)
MCH: 34.6 pg — AB (ref 26.0–34.0)
MCHC: 33.8 g/dL (ref 32.0–36.0)
MCV: 102.3 fL — ABNORMAL HIGH (ref 80.0–100.0)
MONO ABS: 0.5 10*3/uL (ref 0.2–0.9)
MONOS PCT: 9 %
NEUTROS ABS: 3.9 10*3/uL (ref 1.4–6.5)
Neutrophils Relative %: 60 %
PLATELETS: 309 10*3/uL (ref 150–440)
RBC: 3.41 MIL/uL — ABNORMAL LOW (ref 3.80–5.20)
RDW: 19.8 % — AB (ref 11.5–14.5)
WBC: 6.4 10*3/uL (ref 3.6–11.0)

## 2015-07-22 LAB — MAGNESIUM: Magnesium: 1.9 mg/dL (ref 1.7–2.4)

## 2015-07-23 ENCOUNTER — Inpatient Hospital Stay: Payer: Managed Care, Other (non HMO)

## 2015-07-23 ENCOUNTER — Other Ambulatory Visit: Payer: Self-pay | Admitting: *Deleted

## 2015-07-23 ENCOUNTER — Encounter: Payer: Self-pay | Admitting: Oncology

## 2015-07-23 DIAGNOSIS — C3431 Malignant neoplasm of lower lobe, right bronchus or lung: Secondary | ICD-10-CM

## 2015-07-23 MED ORDER — DIPHENHYDRAMINE HCL 50 MG/ML IJ SOLN
50.0000 mg | Freq: Once | INTRAMUSCULAR | Status: AC
  Administered 2015-07-23: 50 mg via INTRAVENOUS
  Filled 2015-07-23: qty 1

## 2015-07-23 MED ORDER — DEXAMETHASONE 4 MG PO TABS: ORAL_TABLET | ORAL | Status: DC

## 2015-07-23 MED ORDER — PALONOSETRON HCL INJECTION 0.25 MG/5ML
0.2500 mg | Freq: Once | INTRAVENOUS | Status: AC
  Administered 2015-07-23: 0.25 mg via INTRAVENOUS
  Filled 2015-07-23: qty 5

## 2015-07-23 MED ORDER — PACLITAXEL CHEMO INJECTION 300 MG/50ML
175.0000 mg/m2 | Freq: Once | INTRAVENOUS | Status: AC
  Administered 2015-07-23: 252 mg via INTRAVENOUS
  Filled 2015-07-23: qty 42

## 2015-07-23 MED ORDER — SODIUM CHLORIDE 0.9 % IV SOLN
410.0000 mg | Freq: Once | INTRAVENOUS | Status: AC
  Administered 2015-07-23: 410 mg via INTRAVENOUS
  Filled 2015-07-23: qty 41

## 2015-07-23 MED ORDER — SODIUM CHLORIDE 0.9 % IV SOLN
Freq: Once | INTRAVENOUS | Status: AC
  Administered 2015-07-23: 10:00:00 via INTRAVENOUS
  Filled 2015-07-23: qty 5

## 2015-07-23 MED ORDER — SODIUM CHLORIDE 0.9 % IV SOLN
Freq: Once | INTRAVENOUS | Status: AC
  Administered 2015-07-23: 10:00:00 via INTRAVENOUS
  Filled 2015-07-23: qty 1000

## 2015-07-23 MED ORDER — PEGFILGRASTIM 6 MG/0.6ML ~~LOC~~ PSKT: 6.0000 mg | PREFILLED_SYRINGE | Freq: Once | SUBCUTANEOUS | Status: DC

## 2015-07-23 MED ORDER — FAMOTIDINE IN NACL 20-0.9 MG/50ML-% IV SOLN
20.0000 mg | Freq: Once | INTRAVENOUS | Status: AC
  Administered 2015-07-23: 20 mg via INTRAVENOUS
  Filled 2015-07-23: qty 50

## 2015-07-23 MED ORDER — HEPARIN SOD (PORK) LOCK FLUSH 100 UNIT/ML IV SOLN
500.0000 [IU] | Freq: Once | INTRAVENOUS | Status: AC | PRN
  Administered 2015-07-23: 500 [IU]
  Filled 2015-07-23: qty 5

## 2015-07-23 NOTE — Progress Notes (Signed)
[place] @ [facility] Telephone:(336) [phone]  Fax:(336) [place]: [birth year]  MR#: [id number]  JYN#:[id number]  Patient Care Team: Maryland Pink, MD as PCP - General (Family Medicine)  CHIEF COMPLAINT:  1.  Squamous cell carcinoma of right  LOWER LOBE OF  lung stage IIIA based on PET scan Biopsy of the (August of 2016)   VISIT DIAGNOSIS:   No diagnosis found.  CANCER  of lung  No history exists.   59 year old lady with history of stage III carcinoma of lung came today further follow-up after 3 cycles of chemotherapy.  Cough is improved.  Shortness of breath is getting better.  Patient had a PET scan done for restaging.  No tingling.  No numbness.  Patient had a problem with index finger which was evaluated last week and was given antibiotic redness and soreness has been getting better.  INTERVAL HISTORY: REVIEW OF SYSTEMS:    general status: Patient is feeling weak and tired.  No change in a performance status.  No chills.  No fever. HEENT: .  No evidence of stomatitis Lungs: No cough or shortness of breath Cardiac: No chest pain or paroxysmal nocturnal dyspnea GI: No nausea no vomiting no diarrhea no abdominal pain Skin: No rash Lower extremity no swelling Neurological system: No tingling.  No numbness.  No other focal signs Musculoskeletal system no bony pains  Review of systems:  All other systems reviewed and found to be negative.  As per HPI. Otherwise, a complete review of systems is negatve.  PAST MEDICAL HISTORY: No significant past medical history.  Denies any diabetes or hypertensio occasional joint pains recently had some swelling of the left ankle area ultrasound was negative for deep vein thrombosis PAST SURGICAL HISTORY: Patient had a hysterectomy.  No other significant past history FAMILY HISTORY There is no significant family history of breast cancer, ovarian cancer, colon cancer GYNECOLOGIC HISTORY:  No LMP recorded.  Patient has had a hysterectomy.     ADVANCED DIRECTIVES:  Patient does have advance healthcare directive, Patient   does not desire to make any changes  HEALTH MAINTENANCE: Social History  Substance Use Topics  . Smoking status: Current Every Day Smoker -- 1.00 packs/day for 40 years    Types: Cigarettes  . Smokeless tobacco: None     Comment: "1 pk or less per day"  . Alcohol Use: 0.0 oz/week    0 Standard drinks or equivalent per week     Comment: beer-occasionally      No Known Allergies  Current Outpatient Prescriptions  Medication Sig Dispense Refill  . albuterol (PROAIR HFA) 108 (90 BASE) MCG/ACT inhaler Inhale into the lungs.    . chlorpheniramine-HYDROcodone (TUSSIONEX PENNKINETIC ER) 10-8 MG/5ML SUER Take 5 mLs by mouth at bedtime as needed for cough. 140 mL 0  . dexamethasone (DECADRON) 4 MG tablet Take 2 tabs BID the day before chemotherapy then take 2 tabs BID the day after chemotherapy. 30 tablet 1  . lidocaine-prilocaine (EMLA) cream Apply to affected area once 30 g 3  . LORazepam (ATIVAN) 0.5 MG tablet Take 1 tablet (0.5 mg total) by mouth at bedtime. Take for inability to sleep. 30 tablet 0  . mometasone-formoterol (DULERA) 200-5 MCG/ACT AERO Use 2 puffs two times daily    . ondansetron (ZOFRAN) 8 MG tablet Take 1 tablet (8 mg total) by mouth 2 (two) times daily. Start the day after chemo for 3 days. Then take as needed for nausea or  vomiting. 30 tablet 1  . prochlorperazine (COMPAZINE) 10 MG tablet Take 1 tablet (10 mg total) by mouth every 6 (six) hours as needed (Nausea or vomiting). 30 tablet 1  . sulfamethoxazole-trimethoprim (BACTRIM DS,SEPTRA DS) 800-160 MG tablet Take 1 tablet by mouth 2 (two) times daily. 14 tablet 0  . traMADol (ULTRAM) 50 MG tablet Take 0.5 tablets (25 mg total) by mouth every 6 (six) hours as needed. 30 tablet 0  . Vitamin D, Ergocalciferol, (DRISDOL) 50000 UNITS CAPS capsule Take by mouth.     No current facility-administered  medications for this visit.   Facility-Administered Medications Ordered in Other Visits  Medication Dose Route Frequency Provider Last Rate Last Dose  . heparin lock flush 100 unit/mL  500 Units Intracatheter Once PRN Forest Gleason, MD        OBJECTIVE: PHYSICAL EXAM: GENERAL:  Well developed, well nourished, sitting comfortably in the exam room in no acute distress. MENTAL STATUS:  Alert and oriented to person, place and time.  ENT:  Oropharynx clear without lesion.  Tongue normal. Mucous membranes moist.  RESPIRATORY:  Clear to auscultation without rales, wheezes or rhonchi. CARDIOVASCULAR:  Regular rate and rhythm without murmur, rub or gallop. BREAST:  Right breast without masses, skin changes or nipple discharge.  Left breast without masses, skin changes or nipple discharge. ABDOMEN:  Soft, non-tender, with active bowel sounds, and no hepatosplenomegaly.  No masses. BACK:  No CVA tenderness.  No tenderness on percussion of the back or rib cage. SKIN:  No rashes, ulcers or lesions. EXTREMITIES: No edema, no skin discoloration or tenderness.  No palpable cords. LYMPH NODES: No palpable cervical, supraclavicular, axillary or inguinal adenopathy  NEUROLOGICAL: Unremarkable. PSYCH:  Appropriate.                     L    WBC count 6.4. Hemoglobin is 11.8. Platelets 309. Serum creatinine is within normal limit   ASSESSMENT:  Non-small cell carcinoma of lung, squamous cell.  Right lower lobe T3 N2 M0 tumor stage IIIa PET scan has been reviewed October 2 016 has been reviewed independently. Mediastinal lymph nodes are not hypermetabolic. Lung mass has decreased in size with decreasing SUV  Bone scan has been reported to be negative. PLAN:    Proceed with the next cycle of chemotherapy Appointment has been made with thought cardiothoracic surgeon for possibility of resection Discussed that with Dr. Faith Rogue regarding that   No matching staging information was found for  the patient.  Forest Gleason, MD   07/23/2015 8:26 AM      [place] @ [facility] Telephone:([phone]  Fax:(336) [phone]

## 2015-07-29 ENCOUNTER — Inpatient Hospital Stay (HOSPITAL_BASED_OUTPATIENT_CLINIC_OR_DEPARTMENT_OTHER): Payer: Managed Care, Other (non HMO) | Admitting: Cardiothoracic Surgery

## 2015-07-29 VITALS — BP 149/74 | HR 92 | Temp 96.3°F | Ht 61.0 in | Wt 111.3 lb

## 2015-07-29 DIAGNOSIS — C3431 Malignant neoplasm of lower lobe, right bronchus or lung: Secondary | ICD-10-CM

## 2015-07-29 NOTE — Progress Notes (Signed)
Patient here for surgery consult post chemotherapy. No complaints today.

## 2015-07-29 NOTE — Progress Notes (Signed)
[facility] Post-[facility]  Patient ID: Claudia Chavez, female   DOB: 1955/12/23, 59 y.o.   MRN: [id number]  HISTORY: She returns today in follow-up. She is tolerating her chemotherapy well. She's had no new problems. Her last set of pulmonary function studies were several months ago and her FEV1 was 67% of predicted with a DLCO of 69% of predicted. I did discuss her care with Dr. Ramond Craver he and he feels that she is ready for surgery. The PET scan she had done recently showed diminished uptake in the mass as well as in the lymph nodes.   Filed Vitals:   07/29/15 1445  BP: 149/74  Pulse: 92  Temp: 96.3 F (35.7 C)     EXAM: Resp: Lungs are clear bilaterally.  No respiratory distress, normal effort. Heart:  Regular without murmurs Abd:  Abdomen is soft, non distended and non tender. No masses are palpable.  There is no rebound and no guarding.  Neurological: Alert and oriented to person, place, and time. Coordination normal.  Skin: Skin is warm and dry. No rash noted. No diaphoretic. No erythema. No pallor.  Psychiatric: Normal mood and affect. Normal behavior. Judgment and thought content normal.    ASSESSMENT: I have independently reviewed the PET scan. Discussed this with the patient and her family. There is some nodularity along the fissure and the not sure whether or not this may be within the pleura. In addition there is some contact with the parietal pleura up against the lateral chest wall. I reviewed with the patient and her husband the indications and risks of right thoracotomy and lung resection. I do not think she could tolerate a right pneumonectomy. I discussed with her the probable need for chemotherapy and radiation therapy postop. Risks of bleeding, infection, air leak and death were all reviewed. Like to proceed.   PLAN:   We'll go ahead and set her up for surgery. I did discuss her care at Dr. Ramond Craver he was in agreement.    Nestor Lewandowsky, MD

## 2015-07-30 ENCOUNTER — Inpatient Hospital Stay: Payer: Managed Care, Other (non HMO)

## 2015-07-30 DIAGNOSIS — C3431 Malignant neoplasm of lower lobe, right bronchus or lung: Secondary | ICD-10-CM | POA: Diagnosis not present

## 2015-07-30 LAB — CBC WITH DIFFERENTIAL/PLATELET
BASOS PCT: 0 %
Basophils Absolute: 0.1 10*3/uL (ref 0–0.1)
Eosinophils Absolute: 0.3 10*3/uL (ref 0–0.7)
Eosinophils Relative: 1 %
HEMATOCRIT: 35.1 % (ref 35.0–47.0)
HEMOGLOBIN: 11.7 g/dL — AB (ref 12.0–16.0)
LYMPHS PCT: 13 %
Lymphs Abs: 2.5 10*3/uL (ref 1.0–3.6)
MCH: 34.8 pg — ABNORMAL HIGH (ref 26.0–34.0)
MCHC: 33.4 g/dL (ref 32.0–36.0)
MCV: 104 fL — ABNORMAL HIGH (ref 80.0–100.0)
MONOS PCT: 6 %
Monocytes Absolute: 1.2 10*3/uL — ABNORMAL HIGH (ref 0.2–0.9)
NEUTROS ABS: 15.8 10*3/uL — AB (ref 1.4–6.5)
NEUTROS PCT: 80 %
Platelets: 261 10*3/uL (ref 150–440)
RBC: 3.38 MIL/uL — ABNORMAL LOW (ref 3.80–5.20)
RDW: 18.7 % — ABNORMAL HIGH (ref 11.5–14.5)
WBC: 19.9 10*3/uL — ABNORMAL HIGH (ref 3.6–11.0)

## 2015-08-02 ENCOUNTER — Telehealth: Payer: Self-pay | Admitting: Cardiothoracic Surgery

## 2015-08-02 NOTE — Telephone Encounter (Signed)
Pt advised of pre op date/time and sx date. Sx: 08/18/15 with Dr Genevive Bi with Dr Marina Gravel assisting--right thoracotomy and right lung resection. Pre op: 08/05/15 @ 12:30.   Sha[place], Please contact patient. She has questions about her other appointments in the future regarding labs and chemotherapy treatment.

## 2015-08-03 NOTE — Telephone Encounter (Signed)
Left voicemail for patient that appointments for 11/10 and 11/11 would be cancelled and that Dr. Oliva Bustard will follow up with her after surgery.

## 2015-08-05 ENCOUNTER — Other Ambulatory Visit: Payer: Managed Care, Other (non HMO)

## 2015-08-06 ENCOUNTER — Inpatient Hospital Stay: Payer: Managed Care, Other (non HMO) | Attending: Oncology

## 2015-08-06 DIAGNOSIS — C3431 Malignant neoplasm of lower lobe, right bronchus or lung: Secondary | ICD-10-CM | POA: Insufficient documentation

## 2015-08-06 LAB — CBC WITH DIFFERENTIAL/PLATELET
Basophils Absolute: 0.1 10*3/uL (ref 0–0.1)
Basophils Relative: 1 %
EOS PCT: 1 %
Eosinophils Absolute: 0.1 10*3/uL (ref 0–0.7)
HEMATOCRIT: 33.7 % — AB (ref 35.0–47.0)
HEMOGLOBIN: 11.4 g/dL — AB (ref 12.0–16.0)
LYMPHS ABS: 2 10*3/uL (ref 1.0–3.6)
LYMPHS PCT: 16 %
MCH: 35.4 pg — AB (ref 26.0–34.0)
MCHC: 33.8 g/dL (ref 32.0–36.0)
MCV: 104.7 fL — ABNORMAL HIGH (ref 80.0–100.0)
MONO ABS: 0.7 10*3/uL (ref 0.2–0.9)
MONOS PCT: 6 %
Neutro Abs: 9.3 10*3/uL — ABNORMAL HIGH (ref 1.4–6.5)
Neutrophils Relative %: 76 %
Platelets: 296 10*3/uL (ref 150–440)
RBC: 3.22 MIL/uL — ABNORMAL LOW (ref 3.80–5.20)
RDW: 19.4 % — ABNORMAL HIGH (ref 11.5–14.5)
WBC: 12.2 10*3/uL — ABNORMAL HIGH (ref 3.6–11.0)

## 2015-08-12 ENCOUNTER — Other Ambulatory Visit: Payer: Managed Care, Other (non HMO)

## 2015-08-12 ENCOUNTER — Ambulatory Visit: Payer: Managed Care, Other (non HMO) | Admitting: Oncology

## 2015-08-13 ENCOUNTER — Ambulatory Visit: Payer: Managed Care, Other (non HMO)

## 2015-08-16 ENCOUNTER — Ambulatory Visit
Admission: RE | Admit: 2015-08-16 | Discharge: 2015-08-16 | Disposition: A | Discharge status: Home / Self Care | Payer: Managed Care, Other (non HMO) | Source: Ambulatory Visit | Attending: Cardiothoracic Surgery | Admitting: Cardiothoracic Surgery

## 2015-08-16 ENCOUNTER — Encounter
Admission: RE | Admit: 2015-08-16 | Discharge: 2015-08-16 | Disposition: A | Discharge status: Home / Self Care | Payer: Managed Care, Other (non HMO) | Source: Ambulatory Visit | Attending: Cardiothoracic Surgery | Admitting: Cardiothoracic Surgery

## 2015-08-16 DIAGNOSIS — Z01818 Encounter for other preprocedural examination: Secondary | ICD-10-CM

## 2015-08-16 LAB — APTT: aPTT: 25 seconds (ref 24–36)

## 2015-08-16 LAB — COMPREHENSIVE METABOLIC PANEL
ALT: 13 U/L — ABNORMAL LOW (ref 14–54)
ANION GAP: 6 (ref 5–15)
AST: 18 U/L (ref 15–41)
Albumin: 4.1 g/dL (ref 3.5–5.0)
Alkaline Phosphatase: 112 U/L (ref 38–126)
BILIRUBIN TOTAL: 0.4 mg/dL (ref 0.3–1.2)
BUN: 9 mg/dL (ref 6–20)
CHLORIDE: 107 mmol/L (ref 101–111)
CO2: 25 mmol/L (ref 22–32)
Calcium: 9.3 mg/dL (ref 8.9–10.3)
Creatinine, Ser: 0.42 mg/dL — ABNORMAL LOW (ref 0.44–1.00)
Glucose, Bld: 95 mg/dL (ref 65–99)
POTASSIUM: 3.8 mmol/L (ref 3.5–5.1)
Sodium: 138 mmol/L (ref 135–145)
TOTAL PROTEIN: 7.2 g/dL (ref 6.5–8.1)

## 2015-08-16 LAB — BLOOD GAS, ARTERIAL
ACID-BASE EXCESS: 1 mmol/L (ref 0.0–3.0)
Allens test (pass/fail): POSITIVE — AB
Bicarbonate: 25.2 mEq/L (ref 21.0–28.0)
O2 SAT: 96.3 %
PATIENT TEMPERATURE: 37
PCO2 ART: 38 mmHg (ref 32.0–48.0)
PO2 ART: 82 mmHg — AB (ref 83.0–108.0)
pH, Arterial: 7.43 (ref 7.350–7.450)

## 2015-08-16 LAB — CBC
HEMATOCRIT: 36.4 % (ref 35.0–47.0)
Hemoglobin: 12.3 g/dL (ref 12.0–16.0)
MCH: 35.7 pg — AB (ref 26.0–34.0)
MCHC: 33.7 g/dL (ref 32.0–36.0)
MCV: 106 fL — AB (ref 80.0–100.0)
PLATELETS: 283 10*3/uL (ref 150–440)
RBC: 3.43 MIL/uL — ABNORMAL LOW (ref 3.80–5.20)
RDW: 17.9 % — AB (ref 11.5–14.5)
WBC: 7.4 10*3/uL (ref 3.6–11.0)

## 2015-08-16 LAB — SURGICAL PCR SCREEN
MRSA, PCR: NEGATIVE
STAPHYLOCOCCUS AUREUS: NEGATIVE

## 2015-08-16 LAB — PROTIME-INR
INR: 0.95
PROTHROMBIN TIME: 12.9 s (ref 11.4–15.0)

## 2015-08-16 LAB — ABO/RH: ABO/RH(D): A POS

## 2015-08-16 NOTE — Patient Instructions (Signed)
  Your procedure is scheduled on: Wednesday Nov. 16, 2016. Report to Same Day Surgery. To find out your arrival time please call [phone] between 1PM - 3PM on Tuesday Nov. 15, 2016.  Remember: Instructions that are not followed completely may result in serious medical risk, up to and including death, or upon the discretion of your surgeon and anesthesiologist your surgery may need to be rescheduled.    _x___ 1. Do not eat food or drink liquids after midnight. No gum chewing or hard candies.     _x___ 2. No Alcohol for 24 hours before or after surgery.   ____ 3. Bring all medications with you on the day of surgery if instructed.    __x__ 4. Notify your doctor if there is any change in your medical condition     (cold, fever, infections).     Do not wear jewelry, make-up, hairpins, clips or nail polish.  Do not wear lotions, powders, or perfumes. You may wear deodorant.  Do not shave 48 hours prior to surgery. Men may shave face and neck.  Do not bring valuables to the hospital.    [facility] is not responsible for any belongings or valuables.               Contacts, dentures or bridgework may not be worn into surgery.  Leave your suitcase in the car. After surgery it may be brought to your room.  For patients admitted to the hospital, discharge time is determined by your treatment team.   Patients discharged the day of surgery will not be allowed to drive home.    Please read over the following fact sheets that you were given:   [facility] Preparing for Surgery  ____ Take these medicines the morning of surgery with A SIP OF WATER: NONE   ____ Fleet Enema (as directed)   __x__ Use CHG Soap as directed  _x___ Use inhalers on the day of surgery and bring to hospital.  ____ Stop metformin 2 days prior to surgery    ____ Take 1/2 of usual insulin dose the night before surgery and none on the morning of surgery.   ____ Stop Coumadin/Plavix/aspirin on does not  apply.  _x___ Stop Anti-inflammatories Aleve, Ibuprofen, Advil, may use Tylenol for pain.   ____ Stop supplements until after surgery.    ____ Bring C-Pap to the hospital.

## 2015-08-18 ENCOUNTER — Inpatient Hospital Stay: Payer: Managed Care, Other (non HMO)

## 2015-08-18 ENCOUNTER — Inpatient Hospital Stay
Admission: RE | Admit: 2015-08-18 | Discharge: 2015-08-23 | DRG: 165 | Disposition: A | Discharge status: Home / Self Care | Payer: Managed Care, Other (non HMO) | Source: Ambulatory Visit | Attending: Cardiothoracic Surgery | Admitting: Cardiothoracic Surgery

## 2015-08-18 ENCOUNTER — Inpatient Hospital Stay: Payer: Managed Care, Other (non HMO) | Admitting: Anesthesiology

## 2015-08-18 ENCOUNTER — Encounter: Payer: Self-pay | Admitting: *Deleted

## 2015-08-18 ENCOUNTER — Encounter
Admission: RE | Disposition: A | Discharge status: Home / Self Care | Payer: Self-pay | Source: Ambulatory Visit | Attending: Cardiothoracic Surgery

## 2015-08-18 DIAGNOSIS — C3490 Malignant neoplasm of unspecified part of unspecified bronchus or lung: Secondary | ICD-10-CM

## 2015-08-18 DIAGNOSIS — Z23 Encounter for immunization: Secondary | ICD-10-CM

## 2015-08-18 DIAGNOSIS — C3431 Malignant neoplasm of lower lobe, right bronchus or lung: Principal | ICD-10-CM | POA: Diagnosis present

## 2015-08-18 DIAGNOSIS — F17210 Nicotine dependence, cigarettes, uncomplicated: Secondary | ICD-10-CM | POA: Diagnosis present

## 2015-08-18 DIAGNOSIS — Z09 Encounter for follow-up examination after completed treatment for conditions other than malignant neoplasm: Secondary | ICD-10-CM

## 2015-08-18 DIAGNOSIS — Z801 Family history of malignant neoplasm of trachea, bronchus and lung: Secondary | ICD-10-CM | POA: Diagnosis not present

## 2015-08-18 DIAGNOSIS — J449 Chronic obstructive pulmonary disease, unspecified: Secondary | ICD-10-CM | POA: Diagnosis present

## 2015-08-18 HISTORY — PX: VIDEO ASSISTED THORACOSCOPY (VATS)/THOROCOTOMY: SHX6173

## 2015-08-18 LAB — PREPARE RBC (CROSSMATCH)

## 2015-08-18 LAB — GLUCOSE, CAPILLARY: Glucose-Capillary: 146 mg/dL — ABNORMAL HIGH (ref 65–99)

## 2015-08-18 SURGERY — VIDEO ASSISTED THORACOSCOPY (VATS)/THOROCOTOMY
Anesthesia: Epidural | Laterality: Right | Wound class: Clean Contaminated

## 2015-08-18 MED ORDER — PHENYLEPHRINE 40 MCG/ML (10ML) SYRINGE FOR IV PUSH (FOR BLOOD PRESSURE SUPPORT)
80.0000 ug | PREFILLED_SYRINGE | INTRAVENOUS | Status: DC | PRN
  Filled 2015-08-18: qty 2

## 2015-08-18 MED ORDER — OXYCODONE HCL 5 MG PO TABS: 5.0000 mg | ORAL_TABLET | Freq: Once | ORAL | Status: DC | PRN

## 2015-08-18 MED ORDER — LACTATED RINGERS IV SOLN
Freq: Once | INTRAVENOUS | Status: AC
  Administered 2015-08-18: 500 mL via INTRAVENOUS

## 2015-08-18 MED ORDER — ONDANSETRON HCL 4 MG/2ML IJ SOLN: 4.0000 mg | Freq: Three times a day (TID) | INTRAMUSCULAR | Status: DC | PRN

## 2015-08-18 MED ORDER — LACTATED RINGERS IV SOLN
INTRAVENOUS | Status: DC
  Administered 2015-08-18 (×2): via INTRAVENOUS

## 2015-08-18 MED ORDER — DEXTROSE 5 % IV SOLN
1.5000 g | INTRAVENOUS | Status: AC
  Administered 2015-08-18: 1.5 g via INTRAVENOUS
  Filled 2015-08-18: qty 1.5

## 2015-08-18 MED ORDER — SODIUM CHLORIDE 0.9 % IV SOLN
14.0000 ug/h | INTRAVENOUS | Status: DC
  Filled 2015-08-18: qty 25

## 2015-08-18 MED ORDER — BISACODYL 5 MG PO TBEC
10.0000 mg | DELAYED_RELEASE_TABLET | Freq: Every day | ORAL | Status: DC
  Administered 2015-08-19 – 2015-08-23 (×5): 10 mg via ORAL
  Filled 2015-08-18 (×5): qty 2

## 2015-08-18 MED ORDER — BUPIVACAINE HCL (PF) 0.25 % IJ SOLN
INTRAMUSCULAR | Status: AC
  Filled 2015-08-18: qty 30

## 2015-08-18 MED ORDER — FAMOTIDINE 20 MG PO TABS
ORAL_TABLET | ORAL | Status: AC
Start: 2015-08-18 — End: 2015-08-18
  Administered 2015-08-18: 20 mg via ORAL
  Filled 2015-08-18: qty 1

## 2015-08-18 MED ORDER — MINERAL OIL LIGHT 100 % EX OIL
TOPICAL_OIL | CUTANEOUS | Status: AC
  Filled 2015-08-18: qty 25

## 2015-08-18 MED ORDER — SODIUM CHLORIDE 0.9 % IV SOLN
INTRAVENOUS | Status: DC | PRN
  Administered 2015-08-18: 14:00:00 via INTRAVENOUS

## 2015-08-18 MED ORDER — MIDAZOLAM HCL 5 MG/5ML IJ SOLN
INTRAMUSCULAR | Status: AC
  Filled 2015-08-18: qty 5

## 2015-08-18 MED ORDER — SODIUM CHLORIDE 0.9 % IV SOLN
10.0000 ug/h | INTRAVENOUS | Status: DC
  Filled 2015-08-18: qty 50

## 2015-08-18 MED ORDER — NALOXONE HCL 0.4 MG/ML IJ SOLN
0.4000 mg | INTRAMUSCULAR | Status: DC | PRN
  Filled 2015-08-18: qty 1

## 2015-08-18 MED ORDER — SUGAMMADEX SODIUM 200 MG/2ML IV SOLN
INTRAVENOUS | Status: DC | PRN
  Administered 2015-08-18: 100 mg via INTRAVENOUS

## 2015-08-18 MED ORDER — FAMOTIDINE 20 MG PO TABS
20.0000 mg | ORAL_TABLET | Freq: Once | ORAL | Status: AC
  Administered 2015-08-18: 20 mg via ORAL

## 2015-08-18 MED ORDER — FENTANYL CITRATE (PF) 100 MCG/2ML IJ SOLN
INTRAMUSCULAR | Status: DC | PRN
  Administered 2015-08-18 (×2): 50 ug via INTRAVENOUS
  Administered 2015-08-18: 25 ug via INTRAVENOUS

## 2015-08-18 MED ORDER — OXYCODONE HCL 5 MG/5ML PO SOLN: 5.0000 mg | Freq: Once | ORAL | Status: DC | PRN

## 2015-08-18 MED ORDER — MIDAZOLAM HCL 2 MG/2ML IJ SOLN
INTRAMUSCULAR | Status: DC | PRN
  Administered 2015-08-18: 1 mg via INTRAVENOUS

## 2015-08-18 MED ORDER — NALBUPHINE HCL 10 MG/ML IJ SOLN
5.0000 mg | Freq: Once | INTRAMUSCULAR | Status: DC | PRN
  Filled 2015-08-18: qty 0.5

## 2015-08-18 MED ORDER — BUPIVACAINE HCL (PF) 0.25 % IJ SOLN
INTRAMUSCULAR | Status: DC | PRN
  Administered 2015-08-18 (×2): 2 mL via EPIDURAL
  Administered 2015-08-18: 4 mL via EPIDURAL
  Administered 2015-08-18: 2 mL via EPIDURAL
  Administered 2015-08-18 (×2): 1 mL via EPIDURAL

## 2015-08-18 MED ORDER — ROCURONIUM BROMIDE 100 MG/10ML IV SOLN
INTRAVENOUS | Status: DC | PRN
  Administered 2015-08-18 (×3): 10 mg via INTRAVENOUS
  Administered 2015-08-18: 30 mg via INTRAVENOUS
  Administered 2015-08-18 (×2): 10 mg via INTRAVENOUS
  Administered 2015-08-18: 5 mg via INTRAVENOUS
  Administered 2015-08-18: 10 mg via INTRAVENOUS

## 2015-08-18 MED ORDER — DEXTROSE 5 % IV SOLN
1.5000 g | Freq: Two times a day (BID) | INTRAVENOUS | Status: AC
  Administered 2015-08-18 – 2015-08-19 (×2): 1.5 g via INTRAVENOUS
  Filled 2015-08-18 (×2): qty 1.5

## 2015-08-18 MED ORDER — FENTANYL CITRATE (PF) 100 MCG/2ML IJ SOLN
25.0000 ug | INTRAMUSCULAR | Status: DC | PRN
  Administered 2015-08-18 (×4): 25 ug via INTRAVENOUS

## 2015-08-18 MED ORDER — DIPHENHYDRAMINE HCL 50 MG/ML IJ SOLN: 12.5000 mg | INTRAMUSCULAR | Status: DC | PRN

## 2015-08-18 MED ORDER — ONDANSETRON HCL 4 MG/2ML IJ SOLN: 4.0000 mg | Freq: Four times a day (QID) | INTRAMUSCULAR | Status: DC | PRN

## 2015-08-18 MED ORDER — IPRATROPIUM-ALBUTEROL 0.5-2.5 (3) MG/3ML IN SOLN
RESPIRATORY_TRACT | Status: AC
  Administered 2015-08-18: 11:00:00
  Filled 2015-08-18: qty 3

## 2015-08-18 MED ORDER — EPHEDRINE 5 MG/ML INJ
10.0000 mg | INTRAVENOUS | Status: DC | PRN
  Filled 2015-08-18: qty 2

## 2015-08-18 MED ORDER — PHENYLEPHRINE HCL 10 MG/ML IJ SOLN
INTRAMUSCULAR | Status: DC | PRN
  Administered 2015-08-18: 150 ug via INTRAVENOUS
  Administered 2015-08-18 (×2): 50 ug via INTRAVENOUS
  Administered 2015-08-18: 150 ug via INTRAVENOUS
  Administered 2015-08-18 (×3): 100 ug via INTRAVENOUS
  Administered 2015-08-18: 150 ug via INTRAVENOUS
  Administered 2015-08-18: 50 ug via INTRAVENOUS
  Administered 2015-08-18 (×3): 100 ug via INTRAVENOUS
  Administered 2015-08-18: 50 ug via INTRAVENOUS

## 2015-08-18 MED ORDER — ONDANSETRON HCL 4 MG/2ML IJ SOLN
INTRAMUSCULAR | Status: DC | PRN
  Administered 2015-08-18: 4 mg via INTRAVENOUS

## 2015-08-18 MED ORDER — DEXTROSE-NACL 5-0.45 % IV SOLN
INTRAVENOUS | Status: DC
  Administered 2015-08-18: 21:00:00 via INTRAVENOUS

## 2015-08-18 MED ORDER — PROPOFOL 10 MG/ML IV BOLUS
INTRAVENOUS | Status: DC | PRN
  Administered 2015-08-18: 100 mg via INTRAVENOUS

## 2015-08-18 MED ORDER — SODIUM CHLORIDE 0.9 % IV SOLN
10.0000 ug/h | INTRAVENOUS | Status: DC
  Administered 2015-08-18: 10 ug/h via EPIDURAL
  Filled 2015-08-18: qty 25

## 2015-08-18 MED ORDER — ALBUTEROL SULFATE (2.5 MG/3ML) 0.083% IN NEBU: 3.0000 mL | INHALATION_SOLUTION | Freq: Four times a day (QID) | RESPIRATORY_TRACT | Status: DC | PRN

## 2015-08-18 MED ORDER — DEXAMETHASONE SODIUM PHOSPHATE 4 MG/ML IJ SOLN
INTRAMUSCULAR | Status: DC | PRN
  Administered 2015-08-18: 5 mg via INTRAVENOUS

## 2015-08-18 MED ORDER — NALOXONE HCL 2 MG/2ML IJ SOSY
1.0000 ug/kg/h | PREFILLED_SYRINGE | INTRAMUSCULAR | Status: DC | PRN
  Filled 2015-08-18: qty 2

## 2015-08-18 MED ORDER — SODIUM CHLORIDE 0.9 % IJ SOLN
INTRAMUSCULAR | Status: AC
  Filled 2015-08-18: qty 3

## 2015-08-18 MED ORDER — NALBUPHINE HCL 10 MG/ML IJ SOLN
5.0000 mg | INTRAMUSCULAR | Status: DC | PRN
  Filled 2015-08-18: qty 0.5

## 2015-08-18 MED ORDER — FENTANYL CITRATE (PF) 100 MCG/2ML IJ SOLN
INTRAMUSCULAR | Status: AC
  Administered 2015-08-18: 25 ug via INTRAVENOUS
  Filled 2015-08-18: qty 2

## 2015-08-18 MED ORDER — BUPIVACAINE 0.25 % ON-Q PUMP DUAL CATH 300 ML: 300.0000 mL | INJECTION | Status: DC

## 2015-08-18 MED ORDER — MOMETASONE FURO-FORMOTEROL FUM 200-5 MCG/ACT IN AERO
2.0000 | INHALATION_SPRAY | Freq: Two times a day (BID) | RESPIRATORY_TRACT | Status: DC
  Administered 2015-08-18 – 2015-08-23 (×10): 2 via RESPIRATORY_TRACT
  Filled 2015-08-18: qty 8.8

## 2015-08-18 MED ORDER — MORPHINE SULFATE (PF) 2 MG/ML IV SOLN
1.0000 mg | INTRAVENOUS | Status: DC | PRN
  Administered 2015-08-18 – 2015-08-20 (×13): 1 mg via INTRAVENOUS
  Filled 2015-08-18 (×12): qty 1

## 2015-08-18 MED ORDER — SODIUM CHLORIDE 0.9 % IJ SOLN: 3.0000 mL | INTRAMUSCULAR | Status: DC | PRN

## 2015-08-18 MED ORDER — PHENYLEPHRINE HCL 10 MG/ML IJ SOLN
0.0000 ug/min | INTRAVENOUS | Status: DC
  Filled 2015-08-18: qty 1

## 2015-08-18 MED ORDER — LIDOCAINE-EPINEPHRINE (PF) 1.5 %-1:200000 IJ SOLN
INTRAMUSCULAR | Status: DC | PRN
  Administered 2015-08-18: 3 mL via EPIDURAL

## 2015-08-18 MED ORDER — DIPHENHYDRAMINE HCL 25 MG PO CAPS: 25.0000 mg | ORAL_CAPSULE | ORAL | Status: DC | PRN

## 2015-08-18 MED ORDER — LIDOCAINE HCL (PF) 1 % IJ SOLN
INTRAMUSCULAR | Status: DC | PRN
  Administered 2015-08-18: 5 mL via EPIDURAL
  Administered 2015-08-18: 1 mL via INTRADERMAL

## 2015-08-18 MED ORDER — BUPIVACAINE 0.5 % ON-Q PUMP SINGLE CATH 400 ML: 400.0000 mL | INJECTION | Status: DC

## 2015-08-18 MED ORDER — MORPHINE SULFATE (PF) 2 MG/ML IV SOLN
INTRAVENOUS | Status: AC
  Administered 2015-08-19: 1 mg via INTRAVENOUS
  Filled 2015-08-18: qty 1

## 2015-08-18 MED ORDER — BUPIVACAINE 0.5 % ON-Q PUMP SINGLE CATH 400 ML
INJECTION | Status: AC
  Filled 2015-08-18: qty 400

## 2015-08-18 MED ORDER — FENTANYL 2.5 MCG/ML W/ROPIVACAINE 0.2% IN NS 100 ML EPIDURAL INFUSION (ARMC-ANES)
6.0000 mL/h | EPIDURAL | Status: DC
  Administered 2015-08-18: 6 mL/h via EPIDURAL

## 2015-08-18 MED ORDER — MIDAZOLAM HCL 2 MG/2ML IJ SOLN
1.0000 mg | Freq: Once | INTRAMUSCULAR | Status: AC
  Administered 2015-08-18: 1 mg via INTRAVENOUS

## 2015-08-18 SURGICAL SUPPLY — 65 items
BNDG COHESIVE 4X5 TAN STRL (GAUZE/BANDAGES/DRESSINGS) IMPLANT
BRONCHOSCOPE PED SLIM DISP (MISCELLANEOUS) ×2 IMPLANT
CANISTER SUCT 1200ML W/VALVE (MISCELLANEOUS) ×2 IMPLANT
CATH KIT ON-Q SILVERSOAK 5IN (CATHETERS) ×2 IMPLANT
CATH THOR STR 28F  SOFT WA (CATHETERS) ×2
CATH THOR STR 28F SOFT WA (CATHETERS) ×2 IMPLANT
CATH TRAY 16F METER LATEX (MISCELLANEOUS) ×2 IMPLANT
CATH URET ROBINSON 16FR STRL (CATHETERS) ×2 IMPLANT
CHLORAPREP W/TINT 26ML (MISCELLANEOUS) ×4 IMPLANT
CNTNR SPEC 2.5X3XGRAD LEK (MISCELLANEOUS)
CONN REDUCER 3/8X3/8X3/8Y (CONNECTOR) ×2
CONNECTOR REDUCER 3/8X3/8X3/8Y (CONNECTOR) ×1 IMPLANT
CONT SPEC 4OZ STER OR WHT (MISCELLANEOUS)
CONTAINER SPEC 2.5X3XGRAD LEK (MISCELLANEOUS) IMPLANT
CUTTER ECHEON FLEX ENDO 45 340 (ENDOMECHANICALS) ×2 IMPLANT
DRAIN CHEST DRY SUCT SGL (MISCELLANEOUS) ×2 IMPLANT
DRAPE C-SECTION (MISCELLANEOUS) ×2 IMPLANT
DRAPE MAG INST 16X20 L/F (DRAPES) ×2 IMPLANT
DRSG TEGADERM 2-3/8X2-3/4 SM (GAUZE/BANDAGES/DRESSINGS) IMPLANT
DRSG TEGADERM 6X8 (GAUZE/BANDAGES/DRESSINGS) IMPLANT
DRSG TELFA 3X8 NADH (GAUZE/BANDAGES/DRESSINGS) ×2 IMPLANT
ELECT BLADE 6.5 EXT (BLADE) ×2 IMPLANT
ELECT CAUTERY BLADE TIP 2.5 (TIP) ×2
ELECTRODE CAUTERY BLDE TIP 2.5 (TIP) ×1 IMPLANT
GAUZE SPONGE 4X4 12PLY STRL (GAUZE/BANDAGES/DRESSINGS) ×2 IMPLANT
GLOVE EXAM LX STRL 7.5 (GLOVE) ×6 IMPLANT
GOWN STRL REUS W/ TWL LRG LVL3 (GOWN DISPOSABLE) ×6 IMPLANT
GOWN STRL REUS W/TWL LRG LVL3 (GOWN DISPOSABLE) ×6
HANDLE YANKAUER SUCT BULB TIP (MISCELLANEOUS) ×4 IMPLANT
KIT RM TURNOVER STRD PROC AR (KITS) ×2 IMPLANT
LABEL OR SOLS (LABEL) ×2 IMPLANT
LOOP RED MAXI  1X406MM (MISCELLANEOUS) ×1
LOOP VESSEL MAXI 1X406 RED (MISCELLANEOUS) ×1 IMPLANT
MARGIN MAP 10MM (MISCELLANEOUS) ×2 IMPLANT
MARKER SKIN W/RULER 31145785 (MISCELLANEOUS) ×2 IMPLANT
PACK BASIN MAJOR ARMC (MISCELLANEOUS) ×2 IMPLANT
PAD GROUND ADULT SPLIT (MISCELLANEOUS) ×2 IMPLANT
RELOAD GOLD ECHELON 45 (STAPLE) ×12 IMPLANT
RELOAD PROXIMATE 30MM BLUE (ENDOMECHANICALS) ×2 IMPLANT
RELOAD STAPLER LINEAR PROX 30 (STAPLE) ×1 IMPLANT
SPONGE KITTNER 5P (MISCELLANEOUS) ×2 IMPLANT
SPONGE XRAY 4X4 16PLY STRL (MISCELLANEOUS) ×2 IMPLANT
STAPLE RELOAD 2.5MM WHITE (STAPLE) ×8 IMPLANT
STAPLER RELOAD LINEAR PROX 30 (STAPLE) ×2
STAPLER SKIN PROX 35W (STAPLE) ×2 IMPLANT
STAPLER VASCULAR ECHELON 35 (CUTTER) ×2 IMPLANT
STRIP CLOSURE SKIN 1/2X4 (GAUZE/BANDAGES/DRESSINGS) IMPLANT
SUT CHROMIC 3 0 SH 27 (SUTURE) ×2 IMPLANT
SUT SILK 0 (SUTURE) ×1
SUT SILK 0 30XBRD TIE 6 (SUTURE) ×1 IMPLANT
SUT SILK 1 SH (SUTURE) ×16 IMPLANT
SUT VIC AB 0 CT1 36 (SUTURE) ×4 IMPLANT
SUT VIC AB 2-0 CT1 27 (SUTURE) ×2
SUT VIC AB 2-0 CT1 TAPERPNT 27 (SUTURE) ×2 IMPLANT
SUT VICRYL 2 TP 1 (SUTURE) ×6 IMPLANT
SYR 10ML SLIP (SYRINGE) ×2 IMPLANT
SYR BULB IRRIG 60ML STRL (SYRINGE) ×2 IMPLANT
TAPE ADH 3 LX (MISCELLANEOUS) ×2 IMPLANT
TAPE TRANSPORE STRL 2 31045 (GAUZE/BANDAGES/DRESSINGS) IMPLANT
TROCAR FLEXIPATH 20X80 (ENDOMECHANICALS) ×2 IMPLANT
TROCAR FLEXIPATH THORACIC 15MM (ENDOMECHANICALS) IMPLANT
TUBING CONNECTING 10 (TUBING) ×2 IMPLANT
TUNNLER SHEATHS 12IN 11GA ON-Q (MISCELLANEOUS) ×2 IMPLANT
WATER STERILE IRR 1000ML POUR (IV SOLUTION) ×2 IMPLANT
YANKAUER SUCT BULB TIP FLEX NO (MISCELLANEOUS) ×2 IMPLANT

## 2015-08-18 NOTE — Op Note (Signed)
08/18/2015  4:38 PM  PATIENT:  Claudia Chavez  59 y.o. female  PRE-OPERATIVE DIAGNOSIS:  Squamous cell carcinoma right lower lobe  POST-OPERATIVE DIAGNOSIS:  Same  PROCEDURE:  #1 preoperative bronchoscopy to assess endobronchial anatomy #2 right thoracotomy with right lower lobectomy and wedge resection of right middle lobe en bloc #3 mediastinal lymphadenectomy #4 insertion of On-Q pain pump  SURGEON:  Surgeon(s) and Role:    * Nestor Lewandowsky, MD - Primary  ASSISTANTS: Dr. Sherri Rad  ANESTHESIA: Gen. endotracheal anesthesia  INDICATIONS FOR PROCEDURE this is a 59 year old woman who has clinical stage IIIa carcinoma the lung status post neoadjuvant chemotherapy who was offered right lower lobectomy after extensive evaluation. Indications and risks of right thoracotomy and right upper lobectomy were explained the patient gave her informed consent.  DICTATION: The patient was brought to the operating suite and placed in the supine position. Gen. endotracheal anesthesia was given through a double-lumen tube. Preoperative bronchoscopy was carried out and was normal to the subsegmental levels bilaterally the patient was then turned for right thoracotomy. All pressure points were carefully padded. The patient was prepped and draped in usual sterile fashion. A posterior lateral fifth interspace thoracotomy was performed. Initially the serratus anterior was saved however because of difficulty with exposure we divided the serratus as well. The tumor was easily palpable in the right lower lobe and measured approximately 8 cm. There is some intense inflammatory reaction between it and the diaphragm. We felt that it was better to staple this area and placing a hand inside the chest and the stapler through an access incision inferiorly we used a stapler to excise a portion of the lung off the diaphragm. The diaphragm itself was intact. This was well away from the tumor no tumor was left on the diaphragm.  We then freed up the inferior pulmonary ligament. We noticed that there is some intense inflammatory reaction at the base of the middle lobe. We identified the middle lobe bronchus and staying superior to it went to identify the superior pulmonary vein and the inferior pulmonary vein and then divided the parenchyma between the middle lobe and the lower lobe and showing that the middle lobe and lower lobe remained intact. This gave [place] a good margin on the tumor itself. We then identified the pulmonary artery and identified the posterior ascending branch, the middle lobe branches, as well as the 2 branches to the lower lobe. These are the superior segmental branch as well as the basilar branches. The fissure between the upper lobe and lower lobe posteriorly was completed with electrocautery. We then took the branches to the lower lobe with a vascular stapler. The inferior pulmonary vein was then stapled as well. The only remaining structure at this point was the lower lobe bronchus. In order to ensure that there is no encroachment on the middle lobe bronchus with the laser segments in the superior segment separately with a TX 30 stapler. The lung was then passed off the field. The pretracheal space was opened and lymph nodes were removed. The subcarinal space was open but there were no lymph nodes identified in the esophagus was very close in this area. We therefore irrigated the chest and identified no further lymph nodes for resection. We filled the chest with water and ventilated the lower lobe bronchi with 30 cm water pressure. There is no air leak from the bronchial stumps. 2 chest tubes were inserted and secured in standard fashion. An On-Q pain system was inserted posteriorly  and brought out through a separate stab wound. This was positioned in the subpleural space above and below the incision. The wounds were then closed after using pro-gel and the cut surfaces of the lung tissue. The ribs were reapproximated  with #2 Vicryl. The muscles of the chest wall were approximated with #2 Vicryl. The subtest tissues with 2-0 Vicryl and the skin with skin clips. Her access incision was closed with nylon. We did send the specimen for frozen section on the bronchial margin and this was negative.  Patient was then turned in the supine position where she was extubated and taken to the recovery room in stable condition. All sponge needle and ensuring counts were correct as reported to me at the end of the case.   Nestor Lewandowsky, MD

## 2015-08-18 NOTE — H&P (View-Only) (Signed)
[facility] Inpatient Post-Op Note  Patient ID: Claudia Chavez, female   DOB: [birth year], 59 y.o.   MRN: [id number]  HISTORY: She returns today in follow-up. She is tolerating her chemotherapy well. She's had no new problems. Her last set of pulmonary function studies were several months ago and her FEV1 was 67% of predicted with a DLCO of 69% of predicted. I did discuss her care with Dr. Ramond Craver he and he feels that she is ready for surgery. The PET scan she had done recently showed diminished uptake in the mass as well as in the lymph nodes.   Filed Vitals:   07/29/15 1445  BP: 149/74  Pulse: 92  Temp: 96.3 F (35.7 C)     EXAM: Resp: Lungs are clear bilaterally.  No respiratory distress, normal effort. Heart:  Regular without murmurs Abd:  Abdomen is soft, non distended and non tender. No masses are palpable.  There is no rebound and no guarding.  Neurological: Alert and oriented to person, place, and time. Coordination normal.  Skin: Skin is warm and dry. No rash noted. No diaphoretic. No erythema. No pallor.  Psychiatric: Normal mood and affect. Normal behavior. Judgment and thought content normal.    ASSESSMENT: I have independently reviewed the PET scan. Discussed this with the patient and her family. There is some nodularity along the fissure and the not sure whether or not this may be within the pleura. In addition there is some contact with the parietal pleura up against the lateral chest wall. I reviewed with the patient and her husband the indications and risks of right thoracotomy and lung resection. I do not think she could tolerate a right pneumonectomy. I discussed with her the probable need for chemotherapy and radiation therapy postop. Risks of bleeding, infection, air leak and death were all reviewed. Like to proceed.   PLAN:   We'll go ahead and set her up for surgery. I did discuss her care at Dr. Ramond Craver he was in agreement.    Nestor Lewandowsky, MD

## 2015-08-18 NOTE — Anesthesia Preprocedure Evaluation (Addendum)
Anesthesia Evaluation  Patient identified by MRN, date of birth, ID band Patient awake    Reviewed: Allergy & Precautions, H&P , NPO status , Patient's Chart, lab work & pertinent test results  History of Anesthesia Complications Negative for: history of anesthetic complications  Airway Mallampati: III  TM Distance: <3 FB Neck ROM: limited    Dental  (+) Poor Dentition, Missing, Upper Dentures, Partial Lower   Pulmonary shortness of breath, pneumonia, COPD, Current Smoker,    Pulmonary exam normal breath sounds clear to auscultation       Cardiovascular Exercise Tolerance: Poor (-) angina+ DOE  (-) Past MI Normal cardiovascular exam Rhythm:regular Rate:Normal     Neuro/Psych PSYCHIATRIC DISORDERS Depression  Neuromuscular disease    GI/Hepatic negative GI ROS, Neg liver ROS,   Endo/Other  negative endocrine ROS  Renal/GU negative Renal ROS  negative genitourinary   Musculoskeletal   Abdominal   Peds  Hematology negative hematology ROS (+)   Anesthesia Other Findings Past Medical History:   COPD (chronic obstructive pulmonary disease) (*              Pneumonia                                       04/2015       Non-small cell lung cancer ([place])                             Cancer of lower lobe of right lung ([place])        05/07/2015    Past Surgical History:   ABDOMINAL HYSTERECTOMY                                        PORTACATH PLACEMENT                             Right 05/10/2015       Comment:Procedure: INSERTION PORT-A-CATH;  Surgeon:               Nestor Lewandowsky, MD;  Location: [facility] ORS;                Service: General;  Laterality: Right;   ELBOW SURGERY                                   Right 1995        BMI    Body Mass Index   20.79 kg/m 2      Reproductive/Obstetrics negative OB ROS                            Anesthesia Physical Anesthesia Plan  ASA: III  Anesthesia Plan:  General ETT and Epidural   Post-op Pain Management: GA combined w/ Regional for post-op pain   Induction:   Airway Management Planned:   Additional Equipment:   Intra-op Plan:   Post-operative Plan:   Informed Consent: I have reviewed the patients History and Physical, chart, labs and discussed the procedure including the risks, benefits and alternatives for the proposed anesthesia with the patient or authorized representative who has indicated  his/her understanding and acceptance.   Dental Advisory Given  Plan Discussed with: Anesthesiologist, CRNA and Surgeon  Anesthesia Plan Comments: (Plan to place epidural discussed with surgeon. Surgeon would like to place On-Q pain pump as well.  Plan to pull epidural if not working and use On-Q pain pump.  If epidural working then plan not to use On-Q pump as to avoid possible local anesthetic toxicity.  Surgeon informed not to begin chemical DVT prophylaxis until epidural catheter is removed.  Surgeon voiced understanding of this plan.)       Anesthesia Quick Evaluation

## 2015-08-18 NOTE — Anesthesia Procedure Notes (Addendum)
Epidural Patient location during procedure: OB Start time: 08/18/2015 11:00 AM End time: 08/18/2015 11:12 AM  Staffing Anesthesiologist: Katy Fitch K Performed by: anesthesiologist   Preanesthetic Checklist Completed: patient identified, site marked, surgical consent, pre-op evaluation, timeout performed, IV checked, risks and benefits discussed, monitors and equipment checked and at surgeon's request  Epidural Patient position: sitting Prep: Betadine Patient monitoring: heart rate, continuous pulse ox and blood pressure Approach: midline Location: thoracic (1-12) (T8) Injection technique: LOR saline  Needle:  Needle type: Tuohy  Needle gauge: 18 G Needle length: 9 cm and 9 Catheter type: closed end flexible Catheter size: 20 Guage Catheter at skin depth: 9 cm Test dose: negative and 1.5% lidocaine with Epi 1:200 K  Assessment Sensory level: T8 Events: blood not aspirated, injection not painful, no injection resistance, negative IV test and no paresthesia  Additional Notes   Patient tolerated the insertion well without complications.Reason for block:at surgeon's request  Procedure Name: Intubation Date/Time: 08/18/2015 11:40 AM Performed by: Allean Found Pre-anesthesia Checklist: Patient identified, Emergency Drugs available, Suction available, Patient being monitored and Timeout performed Patient Re-evaluated:Patient Re-evaluated prior to inductionOxygen Delivery Method: Circle system utilized Preoxygenation: Pre-oxygenation with 100% oxygen Intubation Type: IV induction Ventilation: Mask ventilation without difficulty and Oral airway inserted - appropriate to patient size Laryngoscope Size: McGraph and 3 Grade View: Grade II Tube type: Oral Endobronchial tube: Left and Double lumen EBT and 35 Fr Number of attempts: 1 Airway Equipment and Method: Stylet Placement Confirmation: ETT inserted through vocal cords under direct vision,  positive ETCO2 and  breath sounds checked- equal and bilateral Tube secured with: Tape Dental Injury: Teeth and Oropharynx as per pre-operative assessment  Difficulty Due To: Difficulty was anticipated Comments: Started with Elveria Rising  Due to short thyromental distance.  Placement confirmed per brochoscopy

## 2015-08-18 NOTE — OR Nursing (Signed)
Patient stated that she has no allergies. I asked about an allergy to latex and she stated she was not allergic to latex.

## 2015-08-18 NOTE — Transfer of Care (Signed)
Immediate Anesthesia Transfer of Care Note  Patient: Claudia Chavez  Procedure(s) Performed: Procedure(s): VIDEO ASSISTED THORACOSCOPY (VATS)/THOROCOTOMY (Right)  Patient Location: PACU  Anesthesia Type:General  Level of Consciousness: awake and alert   Airway & Oxygen Therapy: Patient Spontanous Breathing and Patient connected to face mask oxygen  Post-op Assessment: Report given to RN and Post -op Vital signs reviewed and stable  Post vital signs: stable  Last Vitals:  Filed Vitals:   08/18/15 1654  BP: 112/97  Pulse: 84  Temp: 36.8 C  Resp: 22    Complications: No apparent anesthesia complications

## 2015-08-18 NOTE — Interval H&P Note (Signed)
History and Physical Interval Note:  08/18/2015 10:48 AM  Claudia Chavez  has presented today for surgery, with the diagnosis of MALIGNANT NEOPLASM OF LOWER LOBE RIGHT LUNG  The various methods of treatment have been discussed with the patient and family. After consideration of risks, benefits and other options for treatment, the patient has consented to  Procedure(s): VIDEO ASSISTED THORACOSCOPY (VATS)/THOROCOTOMY (Right) as a surgical intervention .  The patient's history has been reviewed, patient examined, no change in status, stable for surgery.  I have reviewed the patient's chart and labs.  Questions were answered to the patient's satisfaction.     Nestor Lewandowsky

## 2015-08-19 ENCOUNTER — Encounter: Payer: Self-pay | Admitting: Cardiothoracic Surgery

## 2015-08-19 ENCOUNTER — Encounter: Payer: Self-pay | Admitting: Registered Nurse

## 2015-08-19 LAB — TYPE AND SCREEN
ABO/RH(D): A POS
ANTIBODY SCREEN: NEGATIVE
UNIT DIVISION: 0
Unit division: 0

## 2015-08-19 LAB — CBC
HCT: 33.3 % — ABNORMAL LOW (ref 35.0–47.0)
HEMOGLOBIN: 11.2 g/dL — AB (ref 12.0–16.0)
MCH: 36.8 pg — AB (ref 26.0–34.0)
MCHC: 33.7 g/dL (ref 32.0–36.0)
MCV: 109.3 fL — AB (ref 80.0–100.0)
PLATELETS: 203 10*3/uL (ref 150–440)
RBC: 3.05 MIL/uL — AB (ref 3.80–5.20)
RDW: 17.8 % — ABNORMAL HIGH (ref 11.5–14.5)
WBC: 14.5 10*3/uL — AB (ref 3.6–11.0)

## 2015-08-19 LAB — COMPREHENSIVE METABOLIC PANEL
ALK PHOS: 85 U/L (ref 38–126)
ALT: 15 U/L (ref 14–54)
AST: 37 U/L (ref 15–41)
Albumin: 3 g/dL — ABNORMAL LOW (ref 3.5–5.0)
Anion gap: 6 (ref 5–15)
BUN: 9 mg/dL (ref 6–20)
CHLORIDE: 108 mmol/L (ref 101–111)
CO2: 25 mmol/L (ref 22–32)
CREATININE: 0.52 mg/dL (ref 0.44–1.00)
Calcium: 8.2 mg/dL — ABNORMAL LOW (ref 8.9–10.3)
Glucose, Bld: 123 mg/dL — ABNORMAL HIGH (ref 65–99)
Potassium: 4.1 mmol/L (ref 3.5–5.1)
Sodium: 139 mmol/L (ref 135–145)
Total Bilirubin: 0.5 mg/dL (ref 0.3–1.2)
Total Protein: 5.8 g/dL — ABNORMAL LOW (ref 6.5–8.1)

## 2015-08-19 LAB — MRSA PCR SCREENING: MRSA BY PCR: NEGATIVE

## 2015-08-19 MED ORDER — TIOTROPIUM BROMIDE MONOHYDRATE 18 MCG IN CAPS
18.0000 ug | ORAL_CAPSULE | Freq: Every day | RESPIRATORY_TRACT | Status: DC
  Administered 2015-08-19 – 2015-08-23 (×3): 18 ug via RESPIRATORY_TRACT
  Filled 2015-08-19: qty 5

## 2015-08-19 MED ORDER — SODIUM CHLORIDE 0.9 % IV SOLN
10.0000 ug/h | INTRAVENOUS | Status: DC
  Filled 2015-08-19: qty 25

## 2015-08-19 MED ORDER — OXYCODONE-ACETAMINOPHEN 5-325 MG PO TABS
1.0000 | ORAL_TABLET | ORAL | Status: DC | PRN
  Administered 2015-08-20 (×5): 1 via ORAL
  Administered 2015-08-21: 2 via ORAL
  Administered 2015-08-21: 1 via ORAL
  Administered 2015-08-21 – 2015-08-23 (×7): 2 via ORAL
  Filled 2015-08-19 (×3): qty 2
  Filled 2015-08-19: qty 1
  Filled 2015-08-19: qty 2
  Filled 2015-08-19 (×2): qty 1
  Filled 2015-08-19: qty 2
  Filled 2015-08-19 (×2): qty 1
  Filled 2015-08-19: qty 2
  Filled 2015-08-19: qty 1
  Filled 2015-08-19 (×2): qty 2

## 2015-08-19 NOTE — Anesthesia Postprocedure Evaluation (Signed)
  Anesthesia Post-op Note  Patient: Claudia Chavez  Procedure(s) Performed: Procedure(s): VIDEO ASSISTED THORACOSCOPY (VATS)/THOROCOTOMY (Right)  Anesthesia type:General ETT, Epidural  Patient location: PACU  Post pain: Pain level controlled  Post assessment: Post-op Vital signs reviewed, Patient's Cardiovascular Status Stable, Respiratory Function Stable, Patent Airway and No signs of Nausea or vomiting  Post vital signs: Reviewed and stable  Last Vitals:  Filed Vitals:   08/19/15 0600  BP: 134/72  Pulse: 75  Temp:   Resp: 12    Level of consciousness: awake, alert  and patient cooperative  Complications: No apparent anesthesia complications

## 2015-08-19 NOTE — Anesthesia Post-op Follow-up Note (Signed)
  Anesthesia Pain Follow-up Note  Patient: Claudia Chavez  Day #: 1  Date of Follow-up: 08/19/2015 Time: 6:55 AM  Last Vitals:  Filed Vitals:   08/19/15 0600  BP: 134/72  Pulse: 75  Temp:   Resp: 12    Level of Consciousness: alert  Pain: mild   Side Effects:None  Catheter Site Exam:clean, dry, no drainage  Plan: Continue current therapy  Kirubel Aja,  [facility]

## 2015-08-19 NOTE — Progress Notes (Signed)
When report given this am from third shift nurse Beth RN stated that patient on fentanyl epidural drip and ON-Q. Orders for the epidural drip discontinued last night per Dr Andree Elk but no CRNA came to remove epidural. Dr Genevive Bi ordered to continue epidural drip due to discomort.Order reentered. Attempted to call Anesthesia to let them know. Paged the on call number [phone]. Per Dr Ronelle Nigh Epidural drip needs to be discontinued, called Dr Genevive Bi and he said to discontinue if that is what Dr Ronelle Nigh stated. He is adding percocet 5/325. Have not seen anesthesia today. Dr Ronelle Nigh stated that he will send a CRNA to remove epidural.

## 2015-08-19 NOTE — Care Management (Signed)
Patient is floor care and anticipate transfer to med surg today.  Patient had thoracotomy and right lower lobectomy for malignancy.  Has chest tubes.  Pain under control this day

## 2015-08-19 NOTE — Progress Notes (Signed)
[facility] Inpatient Post-Op Note  Patient ID: HIEDI TOUCHTON, female   DOB: Dec 20, 1955, 59 y.o.   MRN: [id number]  HISTORY: She had a reasonably quiet night. She does not complain of any shortness of breath. She states that her pain is under adequate control at the present time. She did require a few doses of intravenous morphine for her pain management. She states that she is quite hungry this morning.   Filed Vitals:   08/19/15 0600  BP: 134/72  Pulse: 75  Temp:   Resp: 12     EXAM: Resp: Lungs are rhonchorous bilaterally.  No respiratory distress, normal effort. Heart:  Regular without murmurs Abd:  Abdomen is soft, non distended and non tender. No masses are palpable.  There is no rebound and no guarding.  Neurological: Alert and oriented to person, place, and time. Coordination normal.  Skin: Skin is warm and dry. No rash noted. No diaphoretic. No erythema. No pallor.  Psychiatric: Normal mood and affect. Normal behavior. Judgment and thought content normal.   There is an air leak with cough. The chest tube drainage is serous. The dressings are dry.   ASSESSMENT: She seems be doing well postoperative day #1. We will transfer the patient to the floor.    PLAN:   We will advance her diet. We will ask physical therapy to walk with the patient. Continue her epidural and On-Q pain system.    Nestor Lewandowsky, MD

## 2015-08-20 ENCOUNTER — Inpatient Hospital Stay: Payer: Managed Care, Other (non HMO)

## 2015-08-20 LAB — SURGICAL PATHOLOGY

## 2015-08-20 MED ORDER — INFLUENZA VAC SPLIT QUAD 0.5 ML IM SUSY
0.5000 mL | PREFILLED_SYRINGE | INTRAMUSCULAR | Status: AC
  Administered 2015-08-21: 0.5 mL via INTRAMUSCULAR
  Filled 2015-08-20: qty 0.5

## 2015-08-20 NOTE — Progress Notes (Signed)
Date: 08/20/2015,   MRN# [id number] Claudia Chavez [birth year] Code Status:     Code Status Orders        Start     Ordered   08/18/15 2015  Full code   Continuous     08/18/15 2014    Advance Directive Documentation        Most Recent Value   Type of Advance Directive  Living will   Pre-existing out of facility DNR order (yellow form or pink MOST form)     "MOST" Form in Place?       Hosp day:'@LENGTHOFSTAYDAYS'$ @ Referring MD: '@ATDPROV'$ @         AdmissionWeight: 110 lb (49.896 kg)                 CurrentWeight: 115 lb 11.9 oz (52.5 kg)  CC: copd, post op  HPI: This is a 59 year old lady, smoker, right lower lung mass (), s/p 4 cycles of chemo, subsequently underwent right lobectomy 2 days ago. Pre op FEV1 was 67 %. 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 There is shortness of breat with activity, on 2-3 liters [place] 02 post operatively. Her pain is reasonable controlled, no calf pain, swelling or fever.   PMHX:   Past Medical History  Diagnosis Date  . COPD (chronic obstructive pulmonary disease) ([place])   . Pneumonia 04/2015  . Non-small cell lung cancer ([place])   . Cancer of lower lobe of right lung ([place]) 05/07/2015   Surgical Hx:  Past Surgical History  Procedure Laterality Date  . Abdominal hysterectomy    . Portacath placement Right 05/10/2015    Procedure: INSERTION PORT-A-CATH;  Surgeon: Nestor Lewandowsky, MD;  Location: [facility] ORS;  Service: General;  Laterality: Right;  . Elbow surgery Right 1995  . Video assisted thoracoscopy (vats)/thorocotomy Right 08/18/2015    Procedure: VIDEO ASSISTED THORACOSCOPY (VATS)/THOROCOTOMY;  Surgeon: Nestor Lewandowsky, MD;  Location: [facility] ORS;  Service: General;  Laterality: Right;   Family Hx:  Family History  Problem Relation Age of Onset  . Lung cancer Father 66   Social Hx:   Social History  Substance Use Topics  . Smoking status: Current Every Day Smoker -- 1.00 packs/day for 40 years    Types: Cigarettes  . Smokeless tobacco: None   Comment: "1 pk or less per day"  . Alcohol Use: 1.2 - 2.4 oz/week    0 Standard drinks or equivalent, 2-4 Cans of beer per week     Comment: beer-occasionally   Medication:    Home Medication:  No current outpatient prescriptions on file.  Current Medication: '@CURMEDTAB'$ @   Allergies:  Review of patient's allergies indicates no known allergies.  Review of Systems: Gen:  Denies  fever, sweats, chills HEENT: Denies blurred vision, double vision, ear pain, eye pain, hearing loss, nose bleeds, sore throat Cvc:  No dizziness, chest pain or heaviness Resp: No wheezing, hemoptysis, right chest pain in place   Gi: Denies swallowing difficulty, stomach pain, nausea or vomiting, diarrhea, constipation, bowel incontinence Gu:  Denies bladder incontinence, burning urine Ext:   No Joint pain, stiffness or swelling Skin: No skin rash, easy bruising or bleeding or hives Endoc:  No polyuria, polydipsia , polyphagia or weight change Psych: No depression, insomnia or hallucinations  Other:  All other systems negative  Physical Examination:   VS: BP 130/72 mmHg  Pulse 96  Temp(Src) 98.6 F (37 C) (Oral)  Resp 18  Ht '5\' 1"'$  (1.549 m)  Wt 115 lb  11.9 oz (52.5 kg)  BMI 21.88 kg/m2  SpO2 93%  General Appearance: No distress  Neuro/psych without focal findings, mental status, speech normal, alert and oriented, cranial nerves 2-12 intact, reflexes normal and symmetric, sensation grossly normal  HEENT: PERRLA, EOM intact, no ptosis, no other lesions noticed Neck; Supple, no stridor, jvd, nodes  Pulmonary:.No wheezing, No rales  Chest tube in place, air leak present   Cardiovascular:  Normal S1,S2.  No m/r/g.  Abdominal aorta pulsation normal.    Abdomen:Benign, Soft, non-tender, No masses, hepatosplenomegaly, No lymphadenopathy Endoc: No evident thyromegaly, no signs of acromegaly or Cushing features Skin:   warm, no rashes, no ecchymosis  Extremities: normal, no cyanosis, clubbing, no edema,  warm with normal capillary refill.   Labs results:   Recent Labs     08/19/15  0606  HGB  11.2*  HCT  33.3*  MCV  109.3*  WBC  14.5*  BUN  9  CREATININE  0.52  GLUCOSE  123*  CALCIUM  8.2*  ,    Rad results:   Dg Chest 2 View  08/20/2015  CLINICAL DATA:  Known right lower lobe lung mass, recent right thoracotomy with VATS EXAM: CHEST - 2 VIEW COMPARISON:  08/18/2015 FINDINGS: Right chest wall port is again seen and stable. Two right chest tubes are again noted and stable. No pneumothorax is seen. The left lung remains clear. No pleural effusion is noted. IMPRESSION: Postoperative changes with tubes and lines as described above. No pneumothorax is noted. Electronically Signed   By: Inez Catalina M.D.   On: 08/20/2015 13:16    Assessment and Plan: S/P right lung mass resection, tolerated procedure well Known history of copd, stage II,  this pm holding her own Nicotine abuse  Plan Routine post op care Pulmonary toilet Resume pre admit copd regimen or its equivalent Ambulation Am cxr Following   I have personally obtained a history, examined the patient, evaluated laboratory and imaging results, formulated the assessment and plan and placed orders.  The Patient requires high complexity decision making for assessment and support, frequent evaluation and titration of therapies, application of advanced monitoring technologies and extensive interpretation of multiple databases.   Herbon Fleming,M.D. Pulmonary & Critical care Medicine [facility]

## 2015-08-20 NOTE — Evaluation (Signed)
Physical Therapy Evaluation Patient Details Name: Claudia Chavez MRN: [id number] DOB: 1956-04-30 Today's Date: 08/20/2015   History of Present Illness  Pt is a 59 y.o. female s/p R thoracotomy with R lower lobectomy and wedge resection of R middle lobe and mediastinal lymphadenectomy 08/18/15 secondary to squamous cell carcinoma R lower lobe.  Clinical Impression  Currently pt demonstrates impairments with balance, activity tolerance, pain, and limitations with functional mobility.  Prior to admission, pt was independent without AD.  Pt lives with her husband in 1 level home with stairs to enter.  Currently pt is CGA with transfers and ambulation with RW; pt reporting feeling a little weak with activity but anticipate with continued ambulation and activity pt will be able to transition from RW to no AD.  Pt would benefit from skilled PT to address above noted impairments and functional limitations.  Recommend pt discharge to home with support of family when medically appropriate (anticipate with continued progress during hospital stay pt will not need any further PT upon discharge from hospital).     Follow Up Recommendations  (anticipate no PT needs upon discharge from hospital)    Equipment Recommendations   (anticipate no DME needs upon discharge from hospital)    Recommendations for Other Services       Precautions / Restrictions Precautions Precautions: Fall Precaution Comments: chest tube Restrictions Weight Bearing Restrictions: No      Mobility  Bed Mobility Overal bed mobility: Needs Assistance Bed Mobility: Supine to Sit;Sit to Supine     Supine to sit: Supervision;HOB elevated Sit to supine: Supervision;HOB elevated   General bed mobility comments: assist for lines  Transfers Overall transfer level: Needs assistance Equipment used: Rolling walker (2 wheeled) Transfers: Sit to/from [facility] Sit to Stand: Min guard Stand pivot transfers: Min  guard (transfer to toilet)       General transfer comment: steady and appearing strong with standing  Ambulation/Gait Ambulation/Gait assistance: Min guard Ambulation Distance (Feet): 160 Feet Assistive device: Rolling walker (2 wheeled) Gait Pattern/deviations: Step-through pattern     General Gait Details: decreased cadence but steady  Stairs            Wheelchair Mobility    Modified Rankin (Stroke Patients Only)       Balance Overall balance assessment: Needs assistance Sitting-balance support: No upper extremity supported;Feet supported Sitting balance-Leahy Scale: Normal     Standing balance support: Bilateral upper extremity supported (on RW) Standing balance-Leahy Scale: Good                               Pertinent Vitals/Pain Pain Assessment: 0-10 Pain Score: 4  Pain Location: surgical incision Pain Descriptors / Indicators: Sore;Tender Pain Intervention(s): Limited activity within patient's tolerance;Monitored during session;Repositioned  Vitals stable and WFL throughout treatment session.    Home Living Family/patient expects to be discharged to:: Private residence Living Arrangements: Spouse/significant other Available Help at Discharge: Family Type of Home: House Home Access: Stairs to enter Entrance Stairs-Rails: None Entrance Stairs-Number of Steps: 2 Home Layout: One level Home Equipment: None      Prior Function Level of Independence: Independent         Comments: Pt denies any falls in past 6 months.     Hand Dominance        Extremity/Trunk Assessment   Upper Extremity Assessment: Overall WFL for tasks assessed           Lower  Extremity Assessment: Overall WFL for tasks assessed         Communication   Communication: No difficulties  Cognition Arousal/Alertness: Awake/alert Behavior During Therapy: WFL for tasks assessed/performed Overall Cognitive Status: Within Functional Limits for tasks  assessed                      General Comments General comments (skin integrity, edema, etc.): dressings in place for chest tube and incisions  Nursing cleared pt for participation in physical therapy.  Pt agreeable to PT session.    Exercises  Encouraged pt to ambulate 3x's per day with staff.      Assessment/Plan    PT Assessment Patient needs continued PT services  PT Diagnosis Difficulty walking   PT Problem List Decreased activity tolerance;Decreased balance;Decreased mobility;Pain  PT Treatment Interventions DME instruction;Gait training;Stair training;Functional mobility training;Therapeutic activities;Therapeutic exercise;Balance training;Neuromuscular re-education;Patient/family education   PT Goals (Current goals can be found in the Care Plan section) Acute Rehab PT Goals Patient Stated Goal: to go home PT Goal Formulation: With patient Time For Goal Achievement: 09/03/15 Potential to Achieve Goals: Good    Frequency Min 2X/week   Barriers to discharge        Co-evaluation               End of Session Equipment Utilized During Treatment: Gait belt;Oxygen Activity Tolerance: Patient limited by fatigue Patient left: in bed;with call bell/phone within reach;with bed alarm set Nurse Communication: Mobility status         Time: 9562-1308 PT Time Calculation (min) (ACUTE ONLY): 30 min   Charges:   PT Evaluation $Initial PT Evaluation Tier I: 1 Procedure PT Treatments $Therapeutic Activity: 8-22 mins   PT G CodesLeitha Bleak 2015/09/19, 5:34 PM Leitha Bleak, [place]

## 2015-08-20 NOTE — Progress Notes (Signed)
Claudia Chavez Inpatient Post-Op Note  Patient ID: Claudia Chavez, female   DOB: [birth year], 58 y.o.   MRN: [id number]  HISTORY: Pain is under good control. She ate well yesterday. She is not short of breath. We did remove her Foley catheter today and walked around the nurse's station. She did very well with that.   Filed Vitals:   08/20/15 0528  BP: 155/74  Pulse: 79  Temp: 98 F (36.7 C)  Resp:      EXAM: Resp: Lungs are clear on the left and somewhat diminished on the right..  No respiratory distress, normal effort. Heart:  Regular without murmurs Abd:  Abdomen is soft, non distended and non tender. No masses are palpable.  There is no rebound and no guarding.  Neurological: Alert and oriented to person, place, and time. Coordination normal.  Skin: Skin is warm and dry. No rash noted. No diaphoretic. No erythema. No pallor.  all of her wounds were redressed. There is no erythema.  Psychiatric: Normal mood and affect. Normal behavior. Judgment and thought content normal.   There is only a very small air leak with coughing. The fluid drainage is serous.   ASSESSMENT: I did have an opportunity discuss with Dr. Bryan Lemma the results of the pathology.  The margins are negative on the tumor and all lymph nodes were negative as well. The closest margin was the right middle lobe which was about 5 mm away. Overall the patient appears to be making good progress.   PLAN:   We will leave the chest tube on suction today. If there is no air leak on Saturday we will convert the tube to waterseal. I will obtain a baseline chest x-ray today and some routine laboratory studies tomorrow. The On-Q pain system can be removed tomorrow.    Nestor Lewandowsky, MD

## 2015-08-21 LAB — BASIC METABOLIC PANEL
ANION GAP: 5 (ref 5–15)
BUN: 9 mg/dL (ref 6–20)
CHLORIDE: 107 mmol/L (ref 101–111)
CO2: 28 mmol/L (ref 22–32)
Calcium: 8.5 mg/dL — ABNORMAL LOW (ref 8.9–10.3)
Creatinine, Ser: 0.48 mg/dL (ref 0.44–1.00)
GFR calc non Af Amer: >60 mL/min (ref >60)
Glucose, Bld: 137 mg/dL — ABNORMAL HIGH (ref 65–99)
POTASSIUM: 3 mmol/L — AB (ref 3.5–5.1)
SODIUM: 140 mmol/L (ref 135–145)

## 2015-08-21 LAB — CBC
HCT: 30.6 % — ABNORMAL LOW (ref 35.0–47.0)
Hemoglobin: 10.3 g/dL — ABNORMAL LOW (ref 12.0–16.0)
MCH: 36.7 pg — AB (ref 26.0–34.0)
MCHC: 33.9 g/dL (ref 32.0–36.0)
MCV: 108.3 fL — AB (ref 80.0–100.0)
PLATELETS: 198 10*3/uL (ref 150–440)
RBC: 2.82 MIL/uL — AB (ref 3.80–5.20)
RDW: 16.6 % — AB (ref 11.5–14.5)
WBC: 10.5 10*3/uL (ref 3.6–11.0)

## 2015-08-21 NOTE — Progress Notes (Signed)
Patient ID: Claudia Chavez, female   DOB: [birth year], 59 y.o.   MRN: [id number]   Surgery   postop day #3  The patient is got good pain control. No other significant complaints this morning.   Filed Vitals:   08/20/15 1403 08/20/15 2108 08/21/15 0516 08/21/15 1528  BP: 130/72 141/75 128/82 121/63  Pulse: 96 82 96 81  Temp: 98.6 F (37 C) 97.8 F (36.6 C) 97.8 F (36.6 C) 98.3 F (36.8 C)  TempSrc: Oral Oral Oral Oral  Resp: '18 24 24 17  '$ Height:      Weight:      SpO2: 93% 100% 95% 98%   No air leak is seen on the atrium with good cough. Respiratory excursion is noted.  Lungs are clear bilaterally. No subcutaneous emphysema was noted. On-Q pain pump was removed. Dressing was reapplied.   Thoracotomy dressing is clean dry and intact.   CBC Latest Ref Rng 08/21/2015 08/19/2015 08/16/2015  WBC 3.6 - 11.0 K/uL 10.5 14.5(H) 7.4  Hemoglobin 12.0 - 16.0 g/dL 10.3(L) 11.2(L) 12.3  Hematocrit 35.0 - 47.0 % 30.6(L) 33.3(L) 36.4  Platelets 150 - 440 K/uL 198 203 283    BMP Latest Ref Rng 08/21/2015 08/19/2015 08/16/2015  Glucose 65 - 99 mg/dL 137(H) 123(H) 95  BUN 6 - 20 mg/dL '9 9 9  '$ Creatinine 0.44 - 1.00 mg/dL 0.48 0.52 0.42(L)  Sodium 135 - 145 mmol/L 140 139 138  Potassium 3.5 - 5.1 mmol/L 3.0(L) 4.1 3.8  Chloride 101 - 111 mmol/L 107 108 107  CO2 22 - 32 mmol/L '28 25 25  '$ Calcium 8.9 - 10.3 mg/dL 8.5(L) 8.2(L) 9.3   Impression doing well. Plan On-Q pain pump was removed. A.m. chest x-ray and continued chest tube drainage.

## 2015-08-22 ENCOUNTER — Inpatient Hospital Stay: Payer: Managed Care, Other (non HMO)

## 2015-08-22 NOTE — Progress Notes (Signed)
Patient ID: Claudia Chavez, female   DOB: 11-Sep-1956, 59 y.o.   MRN: [id number]  CXR on 24 hours water seal shows no PTX, no effusion Good aeration

## 2015-08-22 NOTE — Progress Notes (Signed)
POD 4  Minimal complaints of pain.  Ambulating in halls  No CXR yet but ordered.  Filed Vitals:   08/21/15 0516 08/21/15 1528 08/21/15 2108 08/22/15 0600  BP: 128/82 121/63 145/70 139/69  Pulse: 96 81 89 81  Temp: 97.8 F (36.6 C) 98.3 F (36.8 C) 98 F (36.7 C) 97.7 F (36.5 C)  TempSrc: Oral Oral Oral Oral  Resp: '24 17 16 20  '$ Height:      Weight:      SpO2: 95% 98% 98% 91%    I/O last 3 completed shifts: In: 360 [P.O.:360] Out: 302 [Urine:2; Chest Tube:300] Total I/O In: 60 [P.O.:60] Out: 0   total ct output yesterday as 380.   Lungs clear, good cough. abd soft No air leak seen on water seal.  cxr pending  IMP:  Stable POD 4  Plan:  F/u CXR, continue Chest tubes.

## 2015-08-23 ENCOUNTER — Inpatient Hospital Stay: Payer: Managed Care, Other (non HMO)

## 2015-08-23 MED ORDER — MOMETASONE FURO-FORMOTEROL FUM 200-5 MCG/ACT IN AERO: 2.0000 | INHALATION_SPRAY | Freq: Two times a day (BID) | RESPIRATORY_TRACT | Status: DC

## 2015-08-23 MED ORDER — BISACODYL 5 MG PO TBEC: 10.0000 mg | DELAYED_RELEASE_TABLET | Freq: Every day | ORAL | Status: DC

## 2015-08-23 NOTE — Progress Notes (Signed)
Physical Therapy Treatment Patient Details Name: Claudia Chavez MRN: [id number] DOB: 1956-01-14 Today's Date: 08/23/2015    History of Present Illness Pt is a 59 y.o. female s/p R thoracotomy with R lower lobectomy and wedge resection of R middle lobe and mediastinal lymphadenectomy 08/18/15 secondary to squamous cell carcinoma R lower lobe.    PT Comments    Pt demonstrates steady gait without AD.  Pt appears safe to discharge home with assist of husband as needed for bed mobility (pt's husband assists to keep pt from straining) and stairs (and as needed in general).  No further PT needs identified after pt discharges home.   Follow Up Recommendations  No PT follow up     Equipment Recommendations  None recommended by PT    Recommendations for Other Services       Precautions / Restrictions Precautions Precautions:  (moderate fall risk) Precaution Comments: chest tube Restrictions Weight Bearing Restrictions: No    Mobility  Bed Mobility Overal bed mobility: Needs Assistance Bed Mobility: Supine to Sit     Supine to sit: Min assist;HOB elevated (assist from pt's husband to sit up) Sit to supine: Modified independent (Device/Increase time);HOB elevated   General bed mobility comments: pt's husband reports he always assists pt sitting up in bed so she does not strain  Transfers Overall transfer level: Needs assistance Equipment used: None Transfers: Sit to/from Stand Sit to Stand: Supervision         General transfer comment: steady without loss of balance; SBA for chest tube management  Ambulation/Gait Ambulation/Gait assistance: Supervision Ambulation Distance (Feet): 200 Feet Assistive device: None Gait Pattern/deviations: Step-through pattern   Gait velocity interpretation: at or above normal speed for age/gender General Gait Details: steady without loss of balance; SBA for chest tube management   Stairs Stairs:  (pt declined to trial stairs today  (pt reports she will be able to do it with her husband's assist))          Wheelchair Mobility    Modified Rankin (Stroke Patients Only)       Balance Overall balance assessment: Independent Sitting-balance support: No upper extremity supported;Feet supported Sitting balance-Leahy Scale: Normal     Standing balance support: No upper extremity supported Standing balance-Leahy Scale: Normal                      Cognition Arousal/Alertness: Awake/alert Behavior During Therapy: WFL for tasks assessed/performed Overall Cognitive Status: Within Functional Limits for tasks assessed                      Exercises      General Comments General comments (skin integrity, edema, etc.): mild drainage noted in chest tube dressings  Nursing cleared pt for participation in physical therapy.  Pt agreeable to PT session. Pt's husband present during session.  Educated both pt and pt's husband on pacing/energy conservation, fall prevention/safety, and ambulation for HEP.      Pertinent Vitals/Pain Pain Assessment: 0-10 Pain Score: 4  Pain Location: surgical incision Pain Descriptors / Indicators: Sore;Tender Pain Intervention(s): Limited activity within patient's tolerance;Monitored during session;Repositioned  Pt's O2 on room air 90% post ambulation (returned to >92% within a minute of rest).    Home Living                      Prior Function            PT Goals (current goals can now  be found in the care plan section) Acute Rehab PT Goals Patient Stated Goal: to go home PT Goal Formulation: With patient Time For Goal Achievement: 09/03/15 Potential to Achieve Goals: Good Progress towards PT goals: Progressing toward goals    Frequency  Min 2X/week    PT Plan Current plan remains appropriate    Co-evaluation             End of Session Equipment Utilized During Treatment: Gait belt Activity Tolerance: Patient tolerated treatment  well Patient left: in bed;with call bell/phone within reach;with family/visitor present (pt moderate fall risk)     Time: 5573-2202 PT Time Calculation (min) (ACUTE ONLY): 10 min  Charges:  $Therapeutic Activity: 8-22 mins                    G CodesLeitha Bleak 2015-09-08, 10:54 AM Leitha Bleak, [place]

## 2015-08-23 NOTE — Progress Notes (Signed)
Good night.  Eating well.  Ambulating well.  Pain under good control.  VSS, Afebrile.  Minimal chest tube drainage.  Wounds are clean and dry.  CXRay yesterday looked good.    Will clamp chest tube now and repeat CXRay in a few hours.  Discussed path with patient and family and Dr. Oliva Bustard.   [employer].

## 2015-08-23 NOTE — Progress Notes (Signed)
Date: 08/23/2015,   MRN# [id number] SIEDAH SEDOR 1956-03-21 Code Status:     Code Status Orders        Start     Ordered   08/18/15 2015  Full code   Continuous     08/18/15 2014    Advance Directive Documentation        Most Recent Value   Type of Advance Directive  Living will   Pre-existing out of facility DNR order (yellow form or pink MOST form)     "MOST" Form in Place?       Hosp day:'@LENGTHOFSTAYDAYS'$ @ Referring MD: '@ATDPROV'$ @      HPI: Feeling great, tube is clamped, repeat cxr showed residual < 5 %pneumothorax  PMHX:   Past Medical History  Diagnosis Date  . COPD (chronic obstructive pulmonary disease) ([place])   . Pneumonia 04/2015  . Non-small cell lung cancer ([place])   . Cancer of lower lobe of right lung ([place]) 05/07/2015   Surgical Hx:  Past Surgical History  Procedure Laterality Date  . Abdominal hysterectomy    . Portacath placement Right 05/10/2015    Procedure: INSERTION PORT-A-CATH;  Surgeon: Nestor Lewandowsky, MD;  Location: [facility] ORS;  Service: General;  Laterality: Right;  . Elbow surgery Right 1995  . Video assisted thoracoscopy (vats)/thorocotomy Right 08/18/2015    Procedure: VIDEO ASSISTED THORACOSCOPY (VATS)/THOROCOTOMY;  Surgeon: Nestor Lewandowsky, MD;  Location: [facility] ORS;  Service: General;  Laterality: Right;   Family Hx:  Family History  Problem Relation Age of Onset  . Lung cancer Father 86   Social Hx:   Social History  Substance Use Topics  . Smoking status: Current Every Day Smoker -- 1.00 packs/day for 40 years    Types: Cigarettes  . Smokeless tobacco: None     Comment: "1 pk or less per day"  . Alcohol Use: 1.2 - 2.4 oz/week    0 Standard drinks or equivalent, 2-4 Cans of beer per week     Comment: beer-occasionally   Medication:    Home Medication:  No current outpatient prescriptions on file.  Current Medication: '@CURMEDTAB'$ @   Allergies:  Review of patient's allergies indicates no known allergies.  Review of Systems: Gen:   Denies  fever, sweats, chills HEENT: Denies blurred vision, double vision, ear pain, eye pain, hearing loss, nose bleeds, sore throat Cvc:  No dizziness, chest pain or heaviness Resp:  Comfortable, no sob, wheezing or tightness  Gi: Denies swallowing difficulty, stomach pain, nausea or vomiting, diarrhea, constipation, bowel incontinence Gu:  Denies bladder incontinence, burning urine Ext:   No Joint pain, stiffness or swelling Skin: No skin rash, easy bruising or bleeding or hives Endoc:  No polyuria, polydipsia , polyphagia or weight change Psych: No depression, insomnia or hallucinations  Other:  All other systems negative  Physical Examination:   VS: BP 132/68 mmHg  Pulse 91  Temp(Src) 98.3 F (36.8 C) (Oral)  Resp 17  Ht '5\' 1"'$  (1.549 m)  Wt 115 lb 11.9 oz (52.5 kg)  BMI 21.88 kg/m2  SpO2 100%  General Appearance: No distress, family in room, no distress  Neuro/psych without focal findings, mental status, speech normal, alert and oriented, cranial nerves 2-12 intact, reflexes normal and symmetric, sensation grossly normal  HEENT: PERRLA, EOM intact, no ptosis, no other lesions noticed Neck: Supple, no stridor, jvd  Pulmonary:.No wheezing, No rales    Cardiovascular:  Normal S1,S2.  No m/r/g.  Right chest tube in place, clamped Abdomen:Benign, Soft, non-tender, No masses, hepatosplenomegaly,  No lymphadenopathy Endoc: No evident thyromegaly, no signs of acromegaly or Cushing features Skin:   warm, no rashes, no ecchymosis  Extremities: normal, no cyanosis, clubbing, no edema, warm with normal capillary refill. Other findings:   Labs results:   Recent Labs     08/21/15  0952  HGB  10.3*  HCT  30.6*  MCV  108.3*  WBC  10.5  BUN  9  CREATININE  0.48  GLUCOSE  137*  CALCIUM  8.5*  ,   Dg Chest 2 View  08/23/2015  CLINICAL DATA:  Right lower lobe lung carcinoma. Postop from right lung resection. EXAM: CHEST  2 VIEW COMPARISON:  08/22/2015 FINDINGS: Two right-sided  chest tubes remain in place. A tiny less than 5% right apical pneumothorax is seen. Decreased atelectasis seen in the right lung base. Tiny right pleural effusion noted. Left lung remains clear. Heart size is normal. Right-sided Port-A-Cath remains in place. IMPRESSION: Tiny less than 5% right apical pneumothorax. Decreased right basilar atelectasis . Electronically Signed   By: Earle Gell M.D.   On: 08/23/2015 13:05     Assessment and Plan: S/P right lung mass resection, tolerated procedure well, < 5 % right apical ptx Known history of copd, stage II, no problems Nicotine abuse, advised to stop  Plan Routine post op care, tube clamped, ? Tube out later today Pulmonary toilet Resume pre admit copd regimen or its equivalent Ambulation Home soon with out patient follow up visit   I have personally obtained a history, examined the patient, evaluated laboratory and imaging results, formulated the assessment and plan and placed orders.  The Patient requires high complexity decision making for assessment and support, frequent evaluation and titration of therapies, application of advanced monitoring technologies and extensive interpretation of multiple databases.   Jatin Naumann,M.D. Pulmonary & Critical care Medicine [facility]

## 2015-08-23 NOTE — Progress Notes (Signed)
IV was removed. Discharge instructions, follow-up appointments, and prescriptions were provided to the pt. The pt was taken downstairs via wheelchair with husband. All questions answered.

## 2015-08-24 NOTE — Discharge Summary (Signed)
Physician Discharge Summary  Patient ID: LANIQUE GONZALO MRN: [id number] DOB/AGE: December 19, 1955 59 y.o.  Admit date: 08/18/2015 Discharge date: 08/24/2015   Discharge Diagnoses:  Active Problems:   Bronchogenic lung cancer [facility])   Procedures:right lower lobectomy   Hospital Course: no problems.  Did well.  Chest tubes removed and discharged to home.    Disposition: 01-Home or Self Care  Discharge Instructions    Diet - low sodium heart healthy    Complete by:  As directed      Discharge wound care:    Complete by:  As directed   Leave dressing on till Wednesday     Increase activity slowly    Complete by:  As directed             Medication List    STOP taking these medications        dexamethasone 4 MG tablet  Commonly known as:  DECADRON     lidocaine-prilocaine cream  Commonly known as:  EMLA     ondansetron 8 MG tablet  Commonly known as:  ZOFRAN     prochlorperazine 10 MG tablet  Commonly known as:  COMPAZINE      TAKE these medications        bisacodyl 5 MG EC tablet  Commonly known as:  DULCOLAX  Take 2 tablets (10 mg total) by mouth daily.     DULERA 200-5 MCG/ACT Aero  Generic drug:  mometasone-formoterol  Use 2 puffs two times daily     mometasone-formoterol 200-5 MCG/ACT Aero  Commonly known as:  DULERA  Inhale 2 puffs into the lungs 2 (two) times daily.     PROAIR HFA 108 (90 BASE) MCG/ACT inhaler  Generic drug:  albuterol  Inhale 2 puffs into the lungs every 6 (six) hours as needed for wheezing or shortness of breath.     Vitamin D (Ergocalciferol) 50000 UNITS Caps capsule  Commonly known as:  DRISDOL  Take by mouth.     VITAMIN D2 PO  Take 1 capsule by mouth every morning.         Nestor Lewandowsky, MD

## 2015-08-25 ENCOUNTER — Other Ambulatory Visit: Payer: Self-pay | Admitting: *Deleted

## 2015-08-25 DIAGNOSIS — R918 Other nonspecific abnormal finding of lung field: Secondary | ICD-10-CM

## 2015-08-30 ENCOUNTER — Ambulatory Visit
Admission: RE | Admit: 2015-08-30 | Discharge: 2015-08-30 | Disposition: A | Discharge status: Home / Self Care | Payer: Managed Care, Other (non HMO) | Source: Ambulatory Visit | Attending: Cardiothoracic Surgery | Admitting: Cardiothoracic Surgery

## 2015-08-30 ENCOUNTER — Inpatient Hospital Stay (HOSPITAL_BASED_OUTPATIENT_CLINIC_OR_DEPARTMENT_OTHER): Payer: Managed Care, Other (non HMO) | Admitting: Cardiothoracic Surgery

## 2015-08-30 VITALS — BP 131/76 | HR 70 | Temp 97.8°F | Ht 61.0 in | Wt 110.0 lb

## 2015-08-30 DIAGNOSIS — J918 Pleural effusion in other conditions classified elsewhere: Secondary | ICD-10-CM | POA: Diagnosis present

## 2015-08-30 DIAGNOSIS — J948 Other specified pleural conditions: Secondary | ICD-10-CM | POA: Diagnosis not present

## 2015-08-30 DIAGNOSIS — Z9889 Other specified postprocedural states: Secondary | ICD-10-CM | POA: Diagnosis not present

## 2015-08-30 DIAGNOSIS — C801 Malignant (primary) neoplasm, unspecified: Secondary | ICD-10-CM

## 2015-08-30 DIAGNOSIS — R918 Other nonspecific abnormal finding of lung field: Secondary | ICD-10-CM

## 2015-08-30 NOTE — Progress Notes (Signed)
[facility] Inpatient Post-Op Note  Patient ID: Claudia Chavez, female   DOB: September 05, 1956, 59 y.o.   MRN: [id number]  HISTORY: She returns today in follow-up. She states that she's been doing very well at home. She has minimal pain. She denied any fevers chills or cough. She is not short of breath. She states she's able to get around very well and has no problems. She does not wish to have any refills on her prescriptions.   Filed Vitals:   08/30/15 1107  BP: 131/76  Pulse: 70  Temp: 97.8 F (36.6 C)     EXAM: Resp: Lungs are clear bilaterally.  No respiratory distress, normal effort. Heart:  Regular without murmurs Abd:  Abdomen is soft, non distended and non tender. No masses are palpable.  There is no rebound and no guarding.  Neurological: Alert and oriented to person, place, and time. Coordination normal.  Skin: Skin is warm and dry. No rash noted. No diaphoretic. No erythema. No pallor.  Psychiatric: Normal mood and affect. Normal behavior. Judgment and thought content normal.    ASSESSMENT: I have independently reviewed the chest x-ray she had made today. There is a small basilar pneumothorax laterally. The remaining lung is well expanded and clear. We did remove her sutures and staples today and Steri-Strip her wound. I will see her back again on Thursday with another chest x-ray and further follow-up.   PLAN:   I will see her on Thursday for a chest x-ray. I will also ask Dr. Ramond Craver he for his opinion regarding postoperative management.    Nestor Lewandowsky, MD

## 2015-09-01 MED FILL — Bupivacaine HCl Inj 0.5%: Qty: 400 | Status: AC

## 2015-09-02 ENCOUNTER — Encounter: Payer: Self-pay | Admitting: Cardiothoracic Surgery

## 2015-09-02 ENCOUNTER — Ambulatory Visit
Admission: RE | Admit: 2015-09-02 | Discharge: 2015-09-02 | Disposition: A | Discharge status: Home / Self Care | Payer: Managed Care, Other (non HMO) | Source: Ambulatory Visit | Attending: Cardiothoracic Surgery | Admitting: Cardiothoracic Surgery

## 2015-09-02 ENCOUNTER — Inpatient Hospital Stay: Payer: Managed Care, Other (non HMO) | Attending: Cardiothoracic Surgery | Admitting: Cardiothoracic Surgery

## 2015-09-02 VITALS — BP 144/87 | HR 69 | Temp 98.1°F | Wt 107.8 lb

## 2015-09-02 DIAGNOSIS — R531 Weakness: Secondary | ICD-10-CM | POA: Insufficient documentation

## 2015-09-02 DIAGNOSIS — C801 Malignant (primary) neoplasm, unspecified: Secondary | ICD-10-CM

## 2015-09-02 DIAGNOSIS — Z08 Encounter for follow-up examination after completed treatment for malignant neoplasm: Secondary | ICD-10-CM | POA: Diagnosis present

## 2015-09-02 DIAGNOSIS — R5383 Other fatigue: Secondary | ICD-10-CM | POA: Insufficient documentation

## 2015-09-02 DIAGNOSIS — Z9221 Personal history of antineoplastic chemotherapy: Secondary | ICD-10-CM | POA: Insufficient documentation

## 2015-09-02 DIAGNOSIS — F17210 Nicotine dependence, cigarettes, uncomplicated: Secondary | ICD-10-CM | POA: Insufficient documentation

## 2015-09-02 DIAGNOSIS — J948 Other specified pleural conditions: Secondary | ICD-10-CM | POA: Insufficient documentation

## 2015-09-02 DIAGNOSIS — Z79899 Other long term (current) drug therapy: Secondary | ICD-10-CM | POA: Insufficient documentation

## 2015-09-02 DIAGNOSIS — C3490 Malignant neoplasm of unspecified part of unspecified bronchus or lung: Secondary | ICD-10-CM | POA: Insufficient documentation

## 2015-09-02 DIAGNOSIS — C3431 Malignant neoplasm of lower lobe, right bronchus or lung: Secondary | ICD-10-CM | POA: Insufficient documentation

## 2015-09-06 ENCOUNTER — Other Ambulatory Visit: Payer: Self-pay | Admitting: *Deleted

## 2015-09-06 DIAGNOSIS — C3431 Malignant neoplasm of lower lobe, right bronchus or lung: Secondary | ICD-10-CM

## 2015-09-08 ENCOUNTER — Encounter: Payer: Self-pay | Admitting: Oncology

## 2015-09-08 ENCOUNTER — Inpatient Hospital Stay (HOSPITAL_BASED_OUTPATIENT_CLINIC_OR_DEPARTMENT_OTHER): Payer: Managed Care, Other (non HMO) | Admitting: Oncology

## 2015-09-08 ENCOUNTER — Inpatient Hospital Stay: Payer: Managed Care, Other (non HMO)

## 2015-09-08 VITALS — BP 116/62 | HR 97 | Temp 98.0°F | Resp 18 | Wt 108.2 lb

## 2015-09-08 DIAGNOSIS — R531 Weakness: Secondary | ICD-10-CM

## 2015-09-08 DIAGNOSIS — R5383 Other fatigue: Secondary | ICD-10-CM | POA: Diagnosis not present

## 2015-09-08 DIAGNOSIS — F17210 Nicotine dependence, cigarettes, uncomplicated: Secondary | ICD-10-CM | POA: Diagnosis not present

## 2015-09-08 DIAGNOSIS — C3431 Malignant neoplasm of lower lobe, right bronchus or lung: Secondary | ICD-10-CM

## 2015-09-08 DIAGNOSIS — Z79899 Other long term (current) drug therapy: Secondary | ICD-10-CM | POA: Diagnosis not present

## 2015-09-08 DIAGNOSIS — Z9221 Personal history of antineoplastic chemotherapy: Secondary | ICD-10-CM | POA: Diagnosis not present

## 2015-09-08 LAB — COMPREHENSIVE METABOLIC PANEL
ALK PHOS: 131 U/L — AB (ref 38–126)
ALT: 14 U/L (ref 14–54)
AST: 20 U/L (ref 15–41)
Albumin: 4 g/dL (ref 3.5–5.0)
Anion gap: 8 (ref 5–15)
BILIRUBIN TOTAL: 0.4 mg/dL (ref 0.3–1.2)
BUN: 10 mg/dL (ref 6–20)
CALCIUM: 9 mg/dL (ref 8.9–10.3)
CO2: 24 mmol/L (ref 22–32)
CREATININE: 0.49 mg/dL (ref 0.44–1.00)
Chloride: 100 mmol/L — ABNORMAL LOW (ref 101–111)
Glucose, Bld: 114 mg/dL — ABNORMAL HIGH (ref 65–99)
Potassium: 3.7 mmol/L (ref 3.5–5.1)
SODIUM: 132 mmol/L — AB (ref 135–145)
TOTAL PROTEIN: 7.2 g/dL (ref 6.5–8.1)

## 2015-09-08 LAB — CBC WITH DIFFERENTIAL/PLATELET
Basophils Absolute: 0.1 10*3/uL (ref 0–0.1)
Basophils Relative: 1 %
EOS ABS: 0.1 10*3/uL (ref 0–0.7)
EOS PCT: 2 %
HCT: 35.6 % (ref 35.0–47.0)
Hemoglobin: 12.2 g/dL (ref 12.0–16.0)
LYMPHS ABS: 2.2 10*3/uL (ref 1.0–3.6)
LYMPHS PCT: 33 %
MCH: 35.8 pg — AB (ref 26.0–34.0)
MCHC: 34.1 g/dL (ref 32.0–36.0)
MCV: 105 fL — AB (ref 80.0–100.0)
MONOS PCT: 8 %
Monocytes Absolute: 0.5 10*3/uL (ref 0.2–0.9)
Neutro Abs: 3.8 10*3/uL (ref 1.4–6.5)
Neutrophils Relative %: 56 %
PLATELETS: 402 10*3/uL (ref 150–440)
RBC: 3.39 MIL/uL — ABNORMAL LOW (ref 3.80–5.20)
RDW: 13.7 % (ref 11.5–14.5)
WBC: 6.7 10*3/uL (ref 3.6–11.0)

## 2015-09-08 MED ORDER — OXYCODONE-ACETAMINOPHEN 5-325 MG PO TABS: 1.0000 | ORAL_TABLET | Freq: Four times a day (QID) | ORAL | Status: DC | PRN

## 2015-09-08 NOTE — Progress Notes (Signed)
Patient is out of pain medication.  Requesting a rx for Percocet to help her at night and Tramadol to take during the day.

## 2015-09-09 NOTE — Progress Notes (Signed)
[place] @ [facility] Telephone:(336) [phone]  Fax:(336) [place]: 09/15/1956  MR#: [id number]  PIR#:[id number]  Patient Care Team: [place] Pink, MD as PCP - General (Family Medicine)  CHIEF COMPLAINT:  1.  Squamous cell carcinoma of right  LOWER LOBE OF  lung stage IIIA based on PET scan Biopsy of the (August of 2016). 2.  After 3 cycles of chemotherapy patient underwent resection of lung mass (November, 2016) ypT2B   ypN0  status post right lower lobectomy with a VISIT DIAGNOSIS:     ICD-9-CM ICD-10-CM   1. Cancer of lower lobe of right lung (HCC) 162.5 C34.31 oxyCODONE-acetaminophen (PERCOCET/ROXICET) 5-325 MG tablet    CANCER  of lung  No history exists.  INTERVAL HISTORY 59 year old lady came today further follow-up after right lower lobe resection.  Patient and margins were clear.  Tumor size was still 6.3 cm.  There was some chemotherapy effect.  Margins were close but clear.  Multiple station off lymph nodes were evaluated and were negative for any metastatic disease Case was discussed in tumor conference Patient is here for further evaluation and treatment consideration  : REVIEW OF SYSTEMS:    general status: Patient is feeling weak and tired.  No change in a performance status.  No chills.  No fever. HEENT: .  No evidence of stomatitis Lungs: No cough or shortness of breath . patient is recovering from surgery has soreness in the chest wall area Cardiac: No chest pain or paroxysmal nocturnal dyspnea GI: No nausea no vomiting no diarrhea no abdominal pain Skin: No rash Lower extremity no swelling Neurological system: No tingling.  No numbness.  No other focal signs Musculoskeletal system no bony pains  Review of systems:  All other systems reviewed and found to be negative.  As per HPI. Otherwise, a complete review of systems is negatve.  PAST MEDICAL HISTORY: No significant past medical history.  Denies any diabetes or  hypertensio occasional joint pains recently had some swelling of the left ankle area ultrasound was negative for deep vein thrombosis PAST SURGICAL HISTORY: Patient had a hysterectomy.  No other significant past history FAMILY HISTORY There is no significant family history of breast cancer, ovarian cancer, colon cancer GYNECOLOGIC HISTORY:  No LMP recorded. Patient has had a hysterectomy.     ADVANCED DIRECTIVES:  Patient does have advance healthcare directive, Patient   does not desire to make any changes  HEALTH MAINTENANCE: Social History  Substance Use Topics  . Smoking status: Current Every Day Smoker -- 1.00 packs/day for 40 years    Types: Cigarettes  . Smokeless tobacco: None     Comment: "1 pk or less per day"  . Alcohol Use: 1.2 - 2.4 oz/week    0 Standard drinks or equivalent, 2-4 Cans of beer per week     Comment: beer-occasionally      No Known Allergies  Current Outpatient Prescriptions  Medication Sig Dispense Refill  . albuterol (PROAIR HFA) 108 (90 BASE) MCG/ACT inhaler Inhale 2 puffs into the lungs every 6 (six) hours as needed for wheezing or shortness of breath.     . bisacodyl (DULCOLAX) 5 MG EC tablet Take 2 tablets (10 mg total) by mouth daily. 30 tablet 0  . buPROPion (WELLBUTRIN SR) 150 MG 12 hr tablet TAKE 1 TABLET BY MOUTH TWICE DAILY. START 1 TABLET DAILY X 3 DAYS THEN TWICE DAILY    . Ergocalciferol (VITAMIN D2 PO) Take 1 capsule by mouth  every morning.    . mometasone-formoterol (DULERA) 200-5 MCG/ACT AERO Use 2 puffs two times daily    . mometasone-formoterol (DULERA) 200-5 MCG/ACT AERO Inhale 2 puffs into the lungs 2 (two) times daily. 1 Inhaler 3  . Vitamin D, Ergocalciferol, (DRISDOL) 50000 UNITS CAPS capsule Take by mouth.    . oxyCODONE-acetaminophen (PERCOCET/ROXICET) 5-325 MG tablet Take 1 tablet by mouth every 6 (six) hours as needed for severe pain. 15 tablet 0   No current facility-administered medications for this visit.    Facility-Administered Medications Ordered in Other Visits  Medication Dose Route Frequency Provider Last Rate Last Dose  . heparin lock flush 100 unit/mL  500 Units Intracatheter Once PRN Forest Gleason, MD        OBJECTIVE: PHYSICAL EXAM: GENERAL:  Well developed, well nourished, sitting comfortably in the exam room in no acute distress. MENTAL STATUS:  Alert and oriented to person, place and time.  ENT:  Oropharynx clear without lesion.  Tongue normal. Mucous membranes moist.  RESPIRATORY:  Clear to auscultation without rales, wheezes or rhonchi. CARDIOVASCULAR:  Regular rate and rhythm without murmur, rub or gallop. BREAST:  Right breast without masses, skin changes or nipple discharge.  Left breast without masses, skin changes or nipple discharge. ABDOMEN:  Soft, non-tender, with active bowel sounds, and no hepatosplenomegaly.  No masses. BACK:  No CVA tenderness.  No tenderness on percussion of the back or rib cage. SKIN:  No rashes, ulcers or lesions. EXTREMITIES: No edema, no skin discoloration or tenderness.  No palpable cords. LYMPH NODES: No palpable cervical, supraclavicular, axillary or inguinal adenopathy  NEUROLOGICAL: Unremarkable. PSYCH:  Appropriate.                     LYes Yes WBC count 6.4. Hemoglobin is 11.8. Platelets 309. Serum creatinine is within normal limit   ASSESSMENT:  Non-small cell carcinoma of lung, squamous cell.  Right lower lobe T3 N2 M0 tumor stage IIIa Status post right lower lobectomy.  T2b N0 M0 tumor postoperatively margins were clear. Case was discussed in tumor conference.  All pathology report as well as gross specimens pictures were evaluated.  I had prolonged discussion with patient regarding further treatment option  PLAN:   Possibility of radiation therapy because of close margin and last size of the tumor with weekly carboplatinum and Taxol can be considered. Patient agreeable to that.  Intent of chemotherapy is  palliation and relief in symptoms and extending survival Patient was explained all the side effects of chemotherapy.  And informed consent has been obtained Patient has been referred to radiation oncologist Situation was discussed with thoracic surgeon as well as with pathologist  Continues to have pain in the thoracic area at operative site so Percocet was given for the pain  Instruction regarding constipation was given. No matching staging information was found for the patient.  Forest Gleason, MD   09/09/2015 9:21 AM      [place] @ [facility] Telephone:(336) [phone]  Fax:(336) [phone]

## 2015-09-13 NOTE — Progress Notes (Signed)
Patient left without being seen.

## 2015-09-15 ENCOUNTER — Ambulatory Visit: Payer: Managed Care, Other (non HMO) | Admitting: Radiation Oncology

## 2015-09-17 ENCOUNTER — Encounter: Payer: Self-pay | Admitting: Radiation Oncology

## 2015-09-17 ENCOUNTER — Ambulatory Visit
Admission: RE | Admit: 2015-09-17 | Discharge: 2015-09-17 | Disposition: A | Discharge status: Home / Self Care | Payer: Managed Care, Other (non HMO) | Source: Ambulatory Visit | Attending: Radiation Oncology | Admitting: Radiation Oncology

## 2015-09-17 VITALS — BP 145/88 | HR 79 | Temp 96.3°F | Resp 21 | Ht 61.0 in | Wt 107.5 lb

## 2015-09-17 DIAGNOSIS — J449 Chronic obstructive pulmonary disease, unspecified: Secondary | ICD-10-CM | POA: Insufficient documentation

## 2015-09-17 DIAGNOSIS — F17210 Nicotine dependence, cigarettes, uncomplicated: Secondary | ICD-10-CM | POA: Insufficient documentation

## 2015-09-17 DIAGNOSIS — Z9221 Personal history of antineoplastic chemotherapy: Secondary | ICD-10-CM | POA: Insufficient documentation

## 2015-09-17 DIAGNOSIS — C3431 Malignant neoplasm of lower lobe, right bronchus or lung: Secondary | ICD-10-CM | POA: Insufficient documentation

## 2015-09-17 DIAGNOSIS — Z8701 Personal history of pneumonia (recurrent): Secondary | ICD-10-CM | POA: Insufficient documentation

## 2015-09-17 DIAGNOSIS — Z510 Encounter for antineoplastic radiation therapy: Secondary | ICD-10-CM | POA: Insufficient documentation

## 2015-09-17 DIAGNOSIS — Z79899 Other long term (current) drug therapy: Secondary | ICD-10-CM | POA: Insufficient documentation

## 2015-09-17 NOTE — Consult Note (Signed)
Except an outstanding is perfect of Radiation Oncology NEW PATIENT EVALUATION  Name: Claudia Chavez  MRN: [id number]  Date:   09/17/2015     DOB: 1955-11-05   This 59 y.o. female patient presents to the clinic for initial evaluation of lung cancer status post new adjuvant chemotherapy than right lower lobe resection with close margins.  REFERRING PHYSICIAN: Maryland Pink, MD  CHIEF COMPLAINT:  Chief Complaint  Patient presents with  . Lung Cancer    Initial concsult    DIAGNOSIS: The encounter diagnosis was Cancer of lower lobe of right lung ([place]).   PREVIOUS INVESTIGATIONS:  PET CT scans reviewed Clinical notes reviewed Surgical pathology report reviewed  HPI: Patient is a pleasant 59 year old female initially presented with some fatigue and cough to her PMD chest x-ray showed a large right lower lobe mass. Biopsy in August 2016 was positive for squamous cell carcinoma. Initial PET CT scan showing hypermetabolic mass in the right lower lobe intensely positive with increased uptake in the right hilar subcarinal and right paratracheal nodes. This was clinically staged a T3 N2 M0 squamous cell carcinoma stage IIIa. Patient was seen by medical oncology underwent 3 cycles of carboplatin and Taxol with some streaking of the lesion. Patient then underwent right lower lobectomy with surgical pathology showing necrotic tumor with a rim of residual viable squamous cell carcinoma represented 10% of the tumor. Closest margin was at the staple line near the pleura at the staple line which was approximate 4 mm margin. Tumor overall was 6.3 cm. She has done well postoperatively. She is seen today for radiation oncology opinion. Patient may benefit from adjuvant radiation therapy this time based on the large size of her tumor and close margin. Her 6 lymph nodes removed including paratracheal and intralobular nodes were all negative for metastatic disease.  PLANNED TREATMENT REGIMEN: Adjuvant radiation  therapy with possible chemotherapy as radiation sensitizer  PAST MEDICAL HISTORY:  has a past medical history of COPD (chronic obstructive pulmonary disease) ([place]); Pneumonia (04/2015); Non-small cell lung cancer ([place]); and Cancer of lower lobe of right lung ([place]) (05/07/2015).    PAST SURGICAL HISTORY:  Past Surgical History  Procedure Laterality Date  . Abdominal hysterectomy    . Portacath placement Right 05/10/2015    Procedure: INSERTION PORT-A-CATH;  Surgeon: Nestor Lewandowsky, MD;  Location: [facility] ORS;  Service: General;  Laterality: Right;  . Elbow surgery Right 1995  . Video assisted thoracoscopy (vats)/thorocotomy Right 08/18/2015    Procedure: VIDEO ASSISTED THORACOSCOPY (VATS)/THOROCOTOMY;  Surgeon: Nestor Lewandowsky, MD;  Location: [facility] ORS;  Service: General;  Laterality: Right;    FAMILY HISTORY: family history includes Lung cancer (age of onset: 4) in her father.  SOCIAL HISTORY:  reports that she has been smoking Cigarettes.  She has a 40 pack-year smoking history. She does not have any smokeless tobacco history on file. She reports that she drinks about 1.2 - 2.4 oz of alcohol per week. She reports that she does not use illicit drugs.  ALLERGIES: Review of patient's allergies indicates no known allergies.  MEDICATIONS:  Current Outpatient Prescriptions  Medication Sig Dispense Refill  . albuterol (PROAIR HFA) 108 (90 BASE) MCG/ACT inhaler Inhale 2 puffs into the lungs every 6 (six) hours as needed for wheezing or shortness of breath.     . bisacodyl (DULCOLAX) 5 MG EC tablet Take 2 tablets (10 mg total) by mouth daily. 30 tablet 0  . buPROPion (WELLBUTRIN SR) 150 MG 12 hr tablet TAKE 1 TABLET BY MOUTH  TWICE DAILY. START 1 TABLET DAILY X 3 DAYS THEN TWICE DAILY    . Ergocalciferol (VITAMIN D2 PO) Take 1 capsule by mouth every morning.    . mometasone-formoterol (DULERA) 200-5 MCG/ACT AERO Use 2 puffs two times daily    . mometasone-formoterol (DULERA) 200-5 MCG/ACT AERO Inhale 2 puffs  into the lungs 2 (two) times daily. 1 Inhaler 3  . oxybutynin (DITROPAN-XL) 10 MG 24 hr tablet Take 1 tablet by mouth once daily    . oxyCODONE-acetaminophen (PERCOCET/ROXICET) 5-325 MG tablet Take 1 tablet by mouth every 6 (six) hours as needed for severe pain. 15 tablet 0  . Vitamin D, Ergocalciferol, (DRISDOL) 50000 UNITS CAPS capsule Take by mouth.     No current facility-administered medications for this encounter.   Facility-Administered Medications Ordered in Other Encounters  Medication Dose Route Frequency Provider Last Rate Last Dose  . heparin lock flush 100 unit/mL  500 Units Intracatheter Once PRN Forest Gleason, MD        ECOG PERFORMANCE STATUS:  0 - Asymptomatic  REVIEW OF SYSTEMS:  Patient denies any weight loss, fatigue, weakness, fever, chills or night sweats. Patient denies any loss of vision, blurred vision. Patient denies any ringing  of the ears or hearing loss. No irregular heartbeat. Patient denies heart murmur or history of fainting. Patient denies any chest pain or pain radiating to her upper extremities. Patient denies any shortness of breath, difficulty breathing at night, cough or hemoptysis. Patient denies any swelling in the lower legs. Patient denies any nausea vomiting, vomiting of blood, or coffee ground material in the vomitus. Patient denies any stomach pain. Patient states has had normal bowel movements no significant constipation or diarrhea. Patient denies any dysuria, hematuria or significant nocturia. Patient denies any problems walking, swelling in the joints or loss of balance. Patient denies any skin changes, loss of hair or loss of weight. Patient denies any excessive worrying or anxiety or significant depression. Patient denies any problems with insomnia. Patient denies excessive thirst, polyuria, polydipsia. Patient denies any swollen glands, patient denies easy bruising or easy bleeding. Patient denies any recent infections, allergies or URI. Patient "s  visual fields have not changed significantly in recent time.    PHYSICAL EXAM: BP 145/88 mmHg  Pulse 79  Temp(Src) 96.3 F (35.7 C)  Resp 21  Ht '5\' 1"'$  (1.549 m)  Wt 107 lb 7.6 oz (48.75 kg)  BMI 20.32 kg/m2 Patient has multiple right thoracotomy scars and incisions all well-healed. She also has a Port-A-Cath present in the right anterior chest wall. Well-developed well-nourished patient in NAD. HEENT reveals PERLA, EOMI, discs not visualized.  Oral cavity is clear. No oral mucosal lesions are identified. Neck is clear without evidence of cervical or supraclavicular adenopathy. Lungs are clear to A&P. Cardiac examination is essentially unremarkable with regular rate and rhythm without murmur rub or thrill. Abdomen is benign with no organomegaly or masses noted. Motor sensory and DTR levels are equal and symmetric in the upper and lower extremities. Cranial nerves II through XII are grossly intact. Proprioception is intact. No peripheral adenopathy or edema is identified. No motor or sensory levels are noted. Crude visual fields are within normal range.  LABORATORY DATA: Pathology reports reviewed    RADIOLOGY RESULTS: Serial PET/CT scans and CT scans reviewed   IMPRESSION: Stage IIIa squamous cell carcinoma the right lower lobe status post new adjuvant chemotherapy plus right lower lobectomy with close pleural margin  PLAN: At this time I favor and have recommended  adjuvant radiation therapy. Would use her original PET CT scan and a fusion study to delineate exactly the area of probable tumor involvement and treat up to 6000 cGy over 6 weeks with possible concurrent chemotherapy. I would avoid the perihilar region since those nodes were negative and concentrate on the pleural margin. Risks and benefits of treatment including skin reaction, fatigue, alteration of blood counts and loss of some normal lung volume all were described in detail to the patient. She seems to comprehend my treatment  plan well. I have set up and ordered CT simulation on the patient.  I would like to take this opportunity for allowing me to participate in the care of your patient.Armstead Peaks., MD

## 2015-09-28 ENCOUNTER — Ambulatory Visit
Admission: RE | Admit: 2015-09-28 | Discharge: 2015-09-28 | Disposition: A | Discharge status: Home / Self Care | Payer: Managed Care, Other (non HMO) | Source: Ambulatory Visit | Attending: Radiation Oncology | Admitting: Radiation Oncology

## 2015-09-28 ENCOUNTER — Other Ambulatory Visit: Payer: Self-pay | Admitting: *Deleted

## 2015-09-28 DIAGNOSIS — Z510 Encounter for antineoplastic radiation therapy: Secondary | ICD-10-CM | POA: Diagnosis not present

## 2015-09-28 DIAGNOSIS — F17210 Nicotine dependence, cigarettes, uncomplicated: Secondary | ICD-10-CM | POA: Diagnosis not present

## 2015-09-28 DIAGNOSIS — C3431 Malignant neoplasm of lower lobe, right bronchus or lung: Secondary | ICD-10-CM

## 2015-09-28 DIAGNOSIS — Z9221 Personal history of antineoplastic chemotherapy: Secondary | ICD-10-CM | POA: Diagnosis not present

## 2015-09-28 DIAGNOSIS — Z79899 Other long term (current) drug therapy: Secondary | ICD-10-CM | POA: Diagnosis not present

## 2015-09-28 DIAGNOSIS — J449 Chronic obstructive pulmonary disease, unspecified: Secondary | ICD-10-CM | POA: Diagnosis not present

## 2015-09-28 DIAGNOSIS — Z8701 Personal history of pneumonia (recurrent): Secondary | ICD-10-CM | POA: Diagnosis not present

## 2015-09-30 DIAGNOSIS — C3431 Malignant neoplasm of lower lobe, right bronchus or lung: Secondary | ICD-10-CM | POA: Diagnosis not present

## 2015-10-06 ENCOUNTER — Ambulatory Visit
Admission: RE | Admit: 2015-10-06 | Discharge: 2015-10-06 | Disposition: A | Discharge status: Home / Self Care | Payer: Managed Care, Other (non HMO) | Source: Ambulatory Visit | Attending: Radiation Oncology | Admitting: Radiation Oncology

## 2015-10-06 DIAGNOSIS — C3431 Malignant neoplasm of lower lobe, right bronchus or lung: Secondary | ICD-10-CM | POA: Diagnosis not present

## 2015-10-07 ENCOUNTER — Ambulatory Visit
Admission: RE | Admit: 2015-10-07 | Discharge: 2015-10-07 | Disposition: A | Discharge status: Home / Self Care | Payer: Managed Care, Other (non HMO) | Source: Ambulatory Visit | Attending: Radiation Oncology | Admitting: Radiation Oncology

## 2015-10-07 DIAGNOSIS — C3431 Malignant neoplasm of lower lobe, right bronchus or lung: Secondary | ICD-10-CM | POA: Diagnosis not present

## 2015-10-08 ENCOUNTER — Ambulatory Visit
Admission: RE | Admit: 2015-10-08 | Discharge: 2015-10-08 | Disposition: A | Discharge status: Home / Self Care | Payer: Managed Care, Other (non HMO) | Source: Ambulatory Visit | Attending: Radiation Oncology | Admitting: Radiation Oncology

## 2015-10-08 DIAGNOSIS — C3431 Malignant neoplasm of lower lobe, right bronchus or lung: Secondary | ICD-10-CM | POA: Diagnosis not present

## 2015-10-11 ENCOUNTER — Inpatient Hospital Stay: Payer: Managed Care, Other (non HMO)

## 2015-10-11 ENCOUNTER — Ambulatory Visit: Payer: Managed Care, Other (non HMO)

## 2015-10-12 ENCOUNTER — Ambulatory Visit
Admission: RE | Admit: 2015-10-12 | Discharge: 2015-10-12 | Disposition: A | Discharge status: Home / Self Care | Payer: Managed Care, Other (non HMO) | Source: Ambulatory Visit | Attending: Radiation Oncology | Admitting: Radiation Oncology

## 2015-10-12 ENCOUNTER — Telehealth: Payer: Self-pay | Admitting: *Deleted

## 2015-10-12 DIAGNOSIS — C3431 Malignant neoplasm of lower lobe, right bronchus or lung: Secondary | ICD-10-CM | POA: Diagnosis not present

## 2015-10-12 NOTE — Telephone Encounter (Signed)
Patient is schedule for chemo on Thursday and wants to know if she will be receiving a neulasta injection afterwards.  If so she will need to call her insurance company because they ship it to her.   Called patient to inform her that she will not be getting the neulasta injection due to getting radiation.  Patient verbalized understanding.

## 2015-10-13 ENCOUNTER — Ambulatory Visit
Admission: RE | Admit: 2015-10-13 | Discharge: 2015-10-13 | Disposition: A | Discharge status: Home / Self Care | Payer: Managed Care, Other (non HMO) | Source: Ambulatory Visit | Attending: Radiation Oncology | Admitting: Radiation Oncology

## 2015-10-13 DIAGNOSIS — C3431 Malignant neoplasm of lower lobe, right bronchus or lung: Secondary | ICD-10-CM | POA: Diagnosis not present

## 2015-10-14 ENCOUNTER — Ambulatory Visit
Admission: RE | Admit: 2015-10-14 | Discharge: 2015-10-14 | Disposition: A | Discharge status: Home / Self Care | Payer: Managed Care, Other (non HMO) | Source: Ambulatory Visit | Attending: Radiation Oncology | Admitting: Radiation Oncology

## 2015-10-14 ENCOUNTER — Inpatient Hospital Stay: Payer: Managed Care, Other (non HMO)

## 2015-10-14 ENCOUNTER — Inpatient Hospital Stay: Payer: Managed Care, Other (non HMO) | Attending: Oncology

## 2015-10-14 VITALS — BP 151/86 | HR 71 | Temp 97.7°F | Resp 18

## 2015-10-14 DIAGNOSIS — Z5111 Encounter for antineoplastic chemotherapy: Secondary | ICD-10-CM | POA: Insufficient documentation

## 2015-10-14 DIAGNOSIS — C3431 Malignant neoplasm of lower lobe, right bronchus or lung: Secondary | ICD-10-CM | POA: Diagnosis not present

## 2015-10-14 DIAGNOSIS — Z90710 Acquired absence of both cervix and uterus: Secondary | ICD-10-CM | POA: Insufficient documentation

## 2015-10-14 DIAGNOSIS — R5383 Other fatigue: Secondary | ICD-10-CM | POA: Diagnosis not present

## 2015-10-14 DIAGNOSIS — Z79899 Other long term (current) drug therapy: Secondary | ICD-10-CM | POA: Insufficient documentation

## 2015-10-14 DIAGNOSIS — G629 Polyneuropathy, unspecified: Secondary | ICD-10-CM | POA: Insufficient documentation

## 2015-10-14 DIAGNOSIS — G8912 Acute post-thoracotomy pain: Secondary | ICD-10-CM | POA: Diagnosis not present

## 2015-10-14 DIAGNOSIS — R531 Weakness: Secondary | ICD-10-CM | POA: Insufficient documentation

## 2015-10-14 DIAGNOSIS — F17210 Nicotine dependence, cigarettes, uncomplicated: Secondary | ICD-10-CM | POA: Insufficient documentation

## 2015-10-14 LAB — COMPREHENSIVE METABOLIC PANEL
ALK PHOS: 140 U/L — AB (ref 38–126)
ALT: 15 U/L (ref 14–54)
ANION GAP: 5 (ref 5–15)
AST: 17 U/L (ref 15–41)
Albumin: 3.9 g/dL (ref 3.5–5.0)
BILIRUBIN TOTAL: 0.4 mg/dL (ref 0.3–1.2)
BUN: 12 mg/dL (ref 6–20)
CALCIUM: 9 mg/dL (ref 8.9–10.3)
CO2: 27 mmol/L (ref 22–32)
CREATININE: 0.52 mg/dL (ref 0.44–1.00)
Chloride: 103 mmol/L (ref 101–111)
GFR calc non Af Amer: >60 mL/min (ref >60)
Glucose, Bld: 149 mg/dL — ABNORMAL HIGH (ref 65–99)
Potassium: 3.7 mmol/L (ref 3.5–5.1)
SODIUM: 135 mmol/L (ref 135–145)
TOTAL PROTEIN: 7.5 g/dL (ref 6.5–8.1)

## 2015-10-14 LAB — CBC WITH DIFFERENTIAL/PLATELET
Basophils Absolute: 0 10*3/uL (ref 0–0.1)
Basophils Relative: 1 %
Eosinophils Absolute: 0.1 10*3/uL (ref 0–0.7)
Eosinophils Relative: 2 %
HEMATOCRIT: 37.6 % (ref 35.0–47.0)
HEMOGLOBIN: 13 g/dL (ref 12.0–16.0)
LYMPHS ABS: 1.5 10*3/uL (ref 1.0–3.6)
LYMPHS PCT: 29 %
MCH: 35.1 pg — AB (ref 26.0–34.0)
MCHC: 34.6 g/dL (ref 32.0–36.0)
MCV: 101.5 fL — AB (ref 80.0–100.0)
MONOS PCT: 9 %
Monocytes Absolute: 0.5 10*3/uL (ref 0.2–0.9)
NEUTROS ABS: 3.2 10*3/uL (ref 1.4–6.5)
NEUTROS PCT: 59 %
Platelets: 348 10*3/uL (ref 150–440)
RBC: 3.71 MIL/uL — AB (ref 3.80–5.20)
RDW: 13.3 % (ref 11.5–14.5)
WBC: 5.3 10*3/uL (ref 3.6–11.0)

## 2015-10-14 MED ORDER — SODIUM CHLORIDE 0.9 % IV SOLN
Freq: Once | INTRAVENOUS | Status: AC
  Administered 2015-10-14: 10:00:00 via INTRAVENOUS
  Filled 2015-10-14: qty 8

## 2015-10-14 MED ORDER — DEXTROSE 5 % IV SOLN
45.0000 mg/m2 | Freq: Once | INTRAVENOUS | Status: AC
  Administered 2015-10-14: 66 mg via INTRAVENOUS
  Filled 2015-10-14: qty 11

## 2015-10-14 MED ORDER — CARBOPLATIN CHEMO INJECTION 450 MG/45ML
166.0000 mg | Freq: Once | INTRAVENOUS | Status: AC
  Administered 2015-10-14: 170 mg via INTRAVENOUS
  Filled 2015-10-14: qty 17

## 2015-10-14 MED ORDER — FAMOTIDINE IN NACL 20-0.9 MG/50ML-% IV SOLN
20.0000 mg | Freq: Once | INTRAVENOUS | Status: AC
  Administered 2015-10-14: 20 mg via INTRAVENOUS
  Filled 2015-10-14: qty 50

## 2015-10-14 MED ORDER — DIPHENHYDRAMINE HCL 50 MG/ML IJ SOLN
50.0000 mg | Freq: Once | INTRAMUSCULAR | Status: AC
  Administered 2015-10-14: 50 mg via INTRAVENOUS
  Filled 2015-10-14: qty 1

## 2015-10-14 MED ORDER — SODIUM CHLORIDE 0.9 % IV SOLN
Freq: Once | INTRAVENOUS | Status: AC
  Administered 2015-10-14: 09:00:00 via INTRAVENOUS
  Filled 2015-10-14: qty 1000

## 2015-10-14 MED ORDER — SODIUM CHLORIDE 0.9 % IJ SOLN
10.0000 mL | INTRAMUSCULAR | Status: DC | PRN
  Administered 2015-10-14: 10 mL
  Filled 2015-10-14: qty 10

## 2015-10-14 MED ORDER — HEPARIN SOD (PORK) LOCK FLUSH 100 UNIT/ML IV SOLN
500.0000 [IU] | Freq: Once | INTRAVENOUS | Status: AC | PRN
  Administered 2015-10-14: 500 [IU]
  Filled 2015-10-14: qty 5

## 2015-10-15 ENCOUNTER — Ambulatory Visit
Admission: RE | Admit: 2015-10-15 | Discharge: 2015-10-15 | Disposition: A | Discharge status: Home / Self Care | Payer: Managed Care, Other (non HMO) | Source: Ambulatory Visit | Attending: Radiation Oncology | Admitting: Radiation Oncology

## 2015-10-15 DIAGNOSIS — C3431 Malignant neoplasm of lower lobe, right bronchus or lung: Secondary | ICD-10-CM | POA: Diagnosis not present

## 2015-10-18 ENCOUNTER — Inpatient Hospital Stay: Payer: Managed Care, Other (non HMO) | Admitting: Oncology

## 2015-10-18 ENCOUNTER — Inpatient Hospital Stay: Payer: Managed Care, Other (non HMO)

## 2015-10-18 ENCOUNTER — Ambulatory Visit
Admission: RE | Admit: 2015-10-18 | Discharge: 2015-10-18 | Disposition: A | Discharge status: Home / Self Care | Payer: Managed Care, Other (non HMO) | Source: Ambulatory Visit | Attending: Radiation Oncology | Admitting: Radiation Oncology

## 2015-10-18 DIAGNOSIS — C3431 Malignant neoplasm of lower lobe, right bronchus or lung: Secondary | ICD-10-CM | POA: Diagnosis not present

## 2015-10-19 ENCOUNTER — Ambulatory Visit
Admission: RE | Admit: 2015-10-19 | Discharge: 2015-10-19 | Disposition: A | Discharge status: Home / Self Care | Payer: Managed Care, Other (non HMO) | Source: Ambulatory Visit | Attending: Radiation Oncology | Admitting: Radiation Oncology

## 2015-10-19 ENCOUNTER — Other Ambulatory Visit: Payer: Self-pay | Admitting: *Deleted

## 2015-10-19 DIAGNOSIS — C3431 Malignant neoplasm of lower lobe, right bronchus or lung: Secondary | ICD-10-CM

## 2015-10-19 MED ORDER — LANSOPRAZOLE 30 MG PO CPDR: 30.0000 mg | DELAYED_RELEASE_CAPSULE | Freq: Every day | ORAL | Status: DC

## 2015-10-20 ENCOUNTER — Ambulatory Visit
Admission: RE | Admit: 2015-10-20 | Discharge: 2015-10-20 | Disposition: A | Discharge status: Home / Self Care | Payer: Managed Care, Other (non HMO) | Source: Ambulatory Visit | Attending: Radiation Oncology | Admitting: Radiation Oncology

## 2015-10-20 DIAGNOSIS — C3431 Malignant neoplasm of lower lobe, right bronchus or lung: Secondary | ICD-10-CM | POA: Diagnosis not present

## 2015-10-21 ENCOUNTER — Ambulatory Visit
Admission: RE | Admit: 2015-10-21 | Discharge: 2015-10-21 | Disposition: A | Discharge status: Home / Self Care | Payer: Managed Care, Other (non HMO) | Source: Ambulatory Visit | Attending: Radiation Oncology | Admitting: Radiation Oncology

## 2015-10-21 ENCOUNTER — Inpatient Hospital Stay: Payer: Managed Care, Other (non HMO)

## 2015-10-21 ENCOUNTER — Encounter: Payer: Self-pay | Admitting: Oncology

## 2015-10-21 ENCOUNTER — Inpatient Hospital Stay (HOSPITAL_BASED_OUTPATIENT_CLINIC_OR_DEPARTMENT_OTHER): Payer: Managed Care, Other (non HMO) | Admitting: Oncology

## 2015-10-21 VITALS — BP 153/92 | HR 90 | Temp 96.9°F | Resp 18 | Wt 108.7 lb

## 2015-10-21 DIAGNOSIS — C3431 Malignant neoplasm of lower lobe, right bronchus or lung: Secondary | ICD-10-CM

## 2015-10-21 DIAGNOSIS — J42 Unspecified chronic bronchitis: Secondary | ICD-10-CM | POA: Insufficient documentation

## 2015-10-21 DIAGNOSIS — R531 Weakness: Secondary | ICD-10-CM

## 2015-10-21 DIAGNOSIS — G8912 Acute post-thoracotomy pain: Secondary | ICD-10-CM

## 2015-10-21 DIAGNOSIS — R5383 Other fatigue: Secondary | ICD-10-CM | POA: Diagnosis not present

## 2015-10-21 DIAGNOSIS — Z90710 Acquired absence of both cervix and uterus: Secondary | ICD-10-CM

## 2015-10-21 DIAGNOSIS — F17210 Nicotine dependence, cigarettes, uncomplicated: Secondary | ICD-10-CM

## 2015-10-21 DIAGNOSIS — C3491 Malignant neoplasm of unspecified part of right bronchus or lung: Secondary | ICD-10-CM

## 2015-10-21 DIAGNOSIS — G629 Polyneuropathy, unspecified: Secondary | ICD-10-CM

## 2015-10-21 DIAGNOSIS — Z79899 Other long term (current) drug therapy: Secondary | ICD-10-CM

## 2015-10-21 LAB — COMPREHENSIVE METABOLIC PANEL
ALK PHOS: 124 U/L (ref 38–126)
ALT: 16 U/L (ref 14–54)
AST: 18 U/L (ref 15–41)
Albumin: 3.8 g/dL (ref 3.5–5.0)
Anion gap: 8 (ref 5–15)
BILIRUBIN TOTAL: 0.5 mg/dL (ref 0.3–1.2)
BUN: 14 mg/dL (ref 6–20)
CALCIUM: 9 mg/dL (ref 8.9–10.3)
CHLORIDE: 102 mmol/L (ref 101–111)
CO2: 23 mmol/L (ref 22–32)
CREATININE: 0.51 mg/dL (ref 0.44–1.00)
Glucose, Bld: 151 mg/dL — ABNORMAL HIGH (ref 65–99)
Potassium: 3.8 mmol/L (ref 3.5–5.1)
Sodium: 133 mmol/L — ABNORMAL LOW (ref 135–145)
TOTAL PROTEIN: 7.3 g/dL (ref 6.5–8.1)

## 2015-10-21 LAB — CBC WITH DIFFERENTIAL/PLATELET
BASOS ABS: 0 10*3/uL (ref 0–0.1)
Basophils Relative: 0 %
Eosinophils Absolute: 0 10*3/uL (ref 0–0.7)
Eosinophils Relative: 0 %
HEMATOCRIT: 36.8 % (ref 35.0–47.0)
Hemoglobin: 12.6 g/dL (ref 12.0–16.0)
LYMPHS ABS: 0.5 10*3/uL — AB (ref 1.0–3.6)
LYMPHS PCT: 11 %
MCH: 34.7 pg — AB (ref 26.0–34.0)
MCHC: 34.4 g/dL (ref 32.0–36.0)
MCV: 101.1 fL — AB (ref 80.0–100.0)
Monocytes Absolute: 0.1 10*3/uL — ABNORMAL LOW (ref 0.2–0.9)
Monocytes Relative: 2 %
NEUTROS ABS: 4 10*3/uL (ref 1.4–6.5)
Neutrophils Relative %: 87 %
Platelets: 330 10*3/uL (ref 150–440)
RBC: 3.64 MIL/uL — ABNORMAL LOW (ref 3.80–5.20)
RDW: 13.5 % (ref 11.5–14.5)
WBC: 4.6 10*3/uL (ref 3.6–11.0)

## 2015-10-21 MED ORDER — GABAPENTIN 300 MG PO CAPS: 300.0000 mg | ORAL_CAPSULE | Freq: Two times a day (BID) | ORAL | Status: DC

## 2015-10-21 MED ORDER — SODIUM CHLORIDE 0.9 % IJ SOLN
10.0000 mL | Freq: Once | INTRAMUSCULAR | Status: AC
  Administered 2015-10-21: 10 mL via INTRAVENOUS
  Filled 2015-10-21: qty 10

## 2015-10-21 MED ORDER — HEPARIN SOD (PORK) LOCK FLUSH 100 UNIT/ML IV SOLN
500.0000 [IU] | Freq: Once | INTRAVENOUS | Status: AC
  Administered 2015-10-21: 500 [IU] via INTRAVENOUS
  Filled 2015-10-21: qty 5

## 2015-10-21 NOTE — Progress Notes (Signed)
[place] @ [facility] [phone]  [phone]   [place]: 12-Jul-1956  MR#: [id number]  KGU#:[id number]  Patient Care Team: Maryland Pink, MD as PCP - General (Family Medicine)  CHIEF COMPLAINT:  1.  Squamous cell carcinoma of right  LOWER LOBE OF  lung stage IIIA based on PET scan Biopsy of the (August of 2016). 2.  After 3 cycles of chemotherapy patient underwent resection of lung mass (November, 2016) ypT2B   ypN0  status post right lower lobectomy with a 3. She  was started on carboplatinum and Taxol on a weekly basis and radiation therapy from January of 2017 4. After 2 cycles of carboplatinum patient had neuropathy grade 2 interfering with work so chemotherapy was put on hold and radiation was continued (January 19th, 2017) VISIT DIAGNOSIS:     ICD-9-CM ICD-10-CM   1. Cancer of lower lobe of right lung (HCC) 162.5 C34.31 CBC with Differential/Platelet     Comprehensive metabolic panel     Magnesium    CANCER  of lung  No history exists.  INTERVAL HISTORY 60 year old lady came today further follow-up after right lower lobe resection.  Patient and margins were clear.  Tumor size was still 6.3 cm.  There was some chemotherapy effect.  Margins were close but clear.  Multiple station off lymph nodes were evaluated and were negative for any metastatic disease Case was discussed in tumor conference Patient is here for further evaluation and treatment consideration   recent is here for ongoing evaluation and treatment consideration.  Has progressive numbness in both lower extremity and upper extremity.  Lower extremity continues to bother her with ambulation. She has  started radiation therapy    Expand All Collapse All   Patient complains of neuropathy in hands and feet. More in right hand. Also feels like the bottom of her feet feels swollen. Wants to know if she can have medication for neuropathy.    : REVIEW OF SYSTEMS:    general  status: Patient is feeling weak and tired.  No change in a performance status.  No chills.  No fever. HEENT: .  No evidence of stomatitis Lungs: No cough or shortness of breath . patient is recovering from surgery has soreness in the chest wall area Cardiac: No chest pain or paroxysmal nocturnal dyspnea GI: No nausea no vomiting no diarrhea no abdominal pain Skin: No rash Lower extremity no swelling Neurological system: No tingling.  No numbness.  No other focal signs Musculoskeletal system no bony pains  Review of systems:  All other systems reviewed and found to be negative.  As per HPI. Otherwise, a complete review of systems is negatve.  PAST MEDICAL HISTORY: No significant past medical history.  Denies any diabetes or hypertensio occasional joint pains recently had some swelling of the left ankle area ultrasound was negative for deep vein thrombosis PAST SURGICAL HISTORY: Patient had a hysterectomy.  No other significant past history FAMILY HISTORY There is no significant family history of breast cancer, ovarian cancer, colon cancer GYNECOLOGIC HISTORY:  No LMP recorded. Patient has had a hysterectomy.     ADVANCED DIRECTIVES:  Patient does have advance healthcare directive, Patient   does not desire to make any changes  HEALTH MAINTENANCE: Social History  Substance Use Topics  . Smoking status: Current Every Day Smoker -- 1.00 packs/day for 40 years    Types: Cigarettes  . Smokeless tobacco: None     Comment: "1 pk or less per  day"  . Alcohol Use: 1.2 - 2.4 oz/week    0 Standard drinks or equivalent, 2-4 Cans of beer per week     Comment: beer-occasionally      No Known Allergies  Current Outpatient Prescriptions  Medication Sig Dispense Refill  . albuterol (PROAIR HFA) 108 (90 BASE) MCG/ACT inhaler Inhale 2 puffs into the lungs every 6 (six) hours as needed for wheezing or shortness of breath.     . bisacodyl (DULCOLAX) 5 MG EC tablet Take 2 tablets (10 mg total)  by mouth daily. 30 tablet 0  . buPROPion (WELLBUTRIN SR) 150 MG 12 hr tablet TAKE 1 TABLET BY MOUTH TWICE DAILY. START 1 TABLET DAILY X 3 DAYS THEN TWICE DAILY    . Ergocalciferol (VITAMIN D2 PO) Take 1 capsule by mouth every morning.    . lansoprazole (PREVACID) 30 MG capsule Take 1 capsule (30 mg total) by mouth daily at 12 noon. 30 capsule 3  . mometasone-formoterol (DULERA) 200-5 MCG/ACT AERO Use 2 puffs two times daily    . mometasone-formoterol (DULERA) 200-5 MCG/ACT AERO Inhale 2 puffs into the lungs 2 (two) times daily. 1 Inhaler 3  . oxybutynin (DITROPAN-XL) 10 MG 24 hr tablet Take 1 tablet by mouth once daily    . oxyCODONE-acetaminophen (PERCOCET/ROXICET) 5-325 MG tablet Take 1 tablet by mouth every 6 (six) hours as needed for severe pain. 15 tablet 0  . Vitamin D, Ergocalciferol, (DRISDOL) 50000 UNITS CAPS capsule Take by mouth.     No current facility-administered medications for this visit.   Facility-Administered Medications Ordered in Other Visits  Medication Dose Route Frequency Provider Last Rate Last Dose  . heparin lock flush 100 unit/mL  500 Units Intracatheter Once PRN Forest Gleason, MD      . heparin lock flush 100 unit/mL  500 Units Intravenous Once Forest Gleason, MD        OBJECTIVE: PHYSICAL EXAM: GENERAL:  Well developed, well nourished, sitting comfortably in the exam room in no acute distress. MENTAL STATUS:  Alert and oriented to person, place and time.  ENT:  Oropharynx clear without lesion.  Tongue normal. Mucous membranes moist.  RESPIRATORY:  Clear to auscultation without rales, wheezes or rhonchi. CARDIOVASCULAR:  Regular rate and rhythm without murmur, rub or gallop. BREAST:  Right breast without masses, skin changes or nipple discharge.  Left breast without masses, skin changes or nipple discharge. ABDOMEN:  Soft, non-tender, with active bowel sounds, and no hepatosplenomegaly.  No masses. BACK:  No CVA tenderness.  No tenderness on percussion of the  back or rib cage. SKIN:  No rashes, ulcers or lesions. EXTREMITIES: No edema, no skin discoloration or tenderness.  No palpable cords. LYMPH NODES: No palpable cervical, supraclavicular, axillary or inguinal adenopathy  NEUROLOGICAL: Unremarkable. PSYCH:  Appropriate.                     LYes Yes WBC count 6.4. Hemoglobin is 11.8. Platelets 309. Serum creatinine is within normal limit   ASSESSMENT:  Non-small cell carcinoma of lung, squamous cell.  Right lower lobe T3 N2 M0 tumor stage IIIa Status post right lower lobectomy.  T2b N0 M0 tumor postoperatively margins were clear.  patient has started radiation therapy.  Was getting weekly carboplatinum and Taxol PLAN:    had progressive neuropathy interfering with daily activity  we will hold further chemotherapy continue radiation treatment  Neurontin 300 mg twice a day has been started  Reevaluate this again next few weeks  Continues to have pain in the thoracic area at operative site so Percocet was given for the pain  Instruction regarding constipation was given. No matching staging information was found for the patient.  Forest Gleason, MD   10/21/2015 10:13 AM      [place] @ [facility] [phone]  [phone]) [phone]

## 2015-10-21 NOTE — Progress Notes (Signed)
Patient complains of neuropathy in hands and feet.  More in right hand.  Also feels like the bottom of her feet feels swollen.  Wants to know if she can have medication for neuropathy.

## 2015-10-22 ENCOUNTER — Ambulatory Visit
Admission: RE | Admit: 2015-10-22 | Discharge: 2015-10-22 | Disposition: A | Discharge status: Home / Self Care | Payer: Managed Care, Other (non HMO) | Source: Ambulatory Visit | Attending: Radiation Oncology | Admitting: Radiation Oncology

## 2015-10-22 DIAGNOSIS — C3431 Malignant neoplasm of lower lobe, right bronchus or lung: Secondary | ICD-10-CM | POA: Diagnosis not present

## 2015-10-25 ENCOUNTER — Ambulatory Visit
Admission: RE | Admit: 2015-10-25 | Discharge: 2015-10-25 | Disposition: A | Discharge status: Home / Self Care | Payer: Managed Care, Other (non HMO) | Source: Ambulatory Visit | Attending: Radiation Oncology | Admitting: Radiation Oncology

## 2015-10-25 DIAGNOSIS — C3431 Malignant neoplasm of lower lobe, right bronchus or lung: Secondary | ICD-10-CM | POA: Diagnosis not present

## 2015-10-26 ENCOUNTER — Ambulatory Visit
Admission: RE | Admit: 2015-10-26 | Discharge: 2015-10-26 | Disposition: A | Discharge status: Home / Self Care | Payer: Managed Care, Other (non HMO) | Source: Ambulatory Visit | Attending: Radiation Oncology | Admitting: Radiation Oncology

## 2015-10-26 DIAGNOSIS — C3431 Malignant neoplasm of lower lobe, right bronchus or lung: Secondary | ICD-10-CM | POA: Diagnosis not present

## 2015-10-27 ENCOUNTER — Ambulatory Visit
Admission: RE | Admit: 2015-10-27 | Discharge: 2015-10-27 | Disposition: A | Discharge status: Home / Self Care | Payer: Managed Care, Other (non HMO) | Source: Ambulatory Visit | Attending: Radiation Oncology | Admitting: Radiation Oncology

## 2015-10-27 DIAGNOSIS — C3431 Malignant neoplasm of lower lobe, right bronchus or lung: Secondary | ICD-10-CM | POA: Diagnosis not present

## 2015-10-28 ENCOUNTER — Ambulatory Visit
Admission: RE | Admit: 2015-10-28 | Discharge: 2015-10-28 | Disposition: A | Discharge status: Home / Self Care | Payer: Managed Care, Other (non HMO) | Source: Ambulatory Visit | Attending: Radiation Oncology | Admitting: Radiation Oncology

## 2015-10-28 DIAGNOSIS — C3431 Malignant neoplasm of lower lobe, right bronchus or lung: Secondary | ICD-10-CM | POA: Diagnosis not present

## 2015-10-29 ENCOUNTER — Ambulatory Visit
Admission: RE | Admit: 2015-10-29 | Discharge: 2015-10-29 | Disposition: A | Discharge status: Home / Self Care | Payer: Managed Care, Other (non HMO) | Source: Ambulatory Visit | Attending: Radiation Oncology | Admitting: Radiation Oncology

## 2015-10-29 DIAGNOSIS — C3431 Malignant neoplasm of lower lobe, right bronchus or lung: Secondary | ICD-10-CM | POA: Diagnosis not present

## 2015-11-01 ENCOUNTER — Ambulatory Visit
Admission: RE | Admit: 2015-11-01 | Discharge: 2015-11-01 | Disposition: A | Discharge status: Home / Self Care | Payer: Managed Care, Other (non HMO) | Source: Ambulatory Visit | Attending: Radiation Oncology | Admitting: Radiation Oncology

## 2015-11-01 DIAGNOSIS — C3431 Malignant neoplasm of lower lobe, right bronchus or lung: Secondary | ICD-10-CM | POA: Diagnosis not present

## 2015-11-02 ENCOUNTER — Telehealth: Payer: Self-pay | Admitting: *Deleted

## 2015-11-02 ENCOUNTER — Ambulatory Visit
Admission: RE | Admit: 2015-11-02 | Discharge: 2015-11-02 | Disposition: A | Discharge status: Home / Self Care | Payer: Managed Care, Other (non HMO) | Source: Ambulatory Visit | Attending: Radiation Oncology | Admitting: Radiation Oncology

## 2015-11-02 DIAGNOSIS — C3431 Malignant neoplasm of lower lobe, right bronchus or lung: Secondary | ICD-10-CM | POA: Diagnosis not present

## 2015-11-02 MED ORDER — GABAPENTIN 300 MG PO CAPS: 300.0000 mg | ORAL_CAPSULE | Freq: Three times a day (TID) | ORAL | Status: DC

## 2015-11-02 NOTE — Telephone Encounter (Signed)
Per MD, patient can increase gabapentin to TID.

## 2015-11-02 NOTE — Telephone Encounter (Signed)
-----   Message from Daiva Huge, RN sent at 11/02/2015  8:51 AM EST ----- Regarding: gabapentin Ms. Violett hasn't really noticed a difference in her neuropathy with the gabapentin, she was curious about possibly increasing the dose.  Will you give her a call or let me know and I can tell her tomorrow.    Thanks,   [facility]

## 2015-11-02 NOTE — Telephone Encounter (Signed)
Called patient to let her know per MD it is okay to increase her Gabapentin  to TID. Verbalized understanding.

## 2015-11-03 ENCOUNTER — Ambulatory Visit: Payer: Managed Care, Other (non HMO)

## 2015-11-04 ENCOUNTER — Ambulatory Visit
Admission: RE | Admit: 2015-11-04 | Discharge: 2015-11-04 | Disposition: A | Discharge status: Home / Self Care | Payer: Managed Care, Other (non HMO) | Source: Ambulatory Visit | Attending: Radiation Oncology | Admitting: Radiation Oncology

## 2015-11-04 DIAGNOSIS — C3431 Malignant neoplasm of lower lobe, right bronchus or lung: Secondary | ICD-10-CM | POA: Diagnosis not present

## 2015-11-05 ENCOUNTER — Ambulatory Visit
Admission: RE | Admit: 2015-11-05 | Discharge: 2015-11-05 | Disposition: A | Discharge status: Home / Self Care | Payer: Managed Care, Other (non HMO) | Source: Ambulatory Visit | Attending: Radiation Oncology | Admitting: Radiation Oncology

## 2015-11-05 DIAGNOSIS — C3431 Malignant neoplasm of lower lobe, right bronchus or lung: Secondary | ICD-10-CM | POA: Diagnosis not present

## 2015-11-08 ENCOUNTER — Ambulatory Visit
Admission: RE | Admit: 2015-11-08 | Discharge: 2015-11-08 | Disposition: A | Discharge status: Home / Self Care | Payer: Managed Care, Other (non HMO) | Source: Ambulatory Visit | Attending: Radiation Oncology | Admitting: Radiation Oncology

## 2015-11-08 DIAGNOSIS — C3431 Malignant neoplasm of lower lobe, right bronchus or lung: Secondary | ICD-10-CM | POA: Diagnosis not present

## 2015-11-09 ENCOUNTER — Ambulatory Visit
Admission: RE | Admit: 2015-11-09 | Discharge: 2015-11-09 | Disposition: A | Discharge status: Home / Self Care | Payer: Managed Care, Other (non HMO) | Source: Ambulatory Visit | Attending: Radiation Oncology | Admitting: Radiation Oncology

## 2015-11-09 DIAGNOSIS — C3431 Malignant neoplasm of lower lobe, right bronchus or lung: Secondary | ICD-10-CM | POA: Diagnosis not present

## 2015-11-10 ENCOUNTER — Ambulatory Visit
Admission: RE | Admit: 2015-11-10 | Discharge: 2015-11-10 | Disposition: A | Discharge status: Home / Self Care | Payer: Managed Care, Other (non HMO) | Source: Ambulatory Visit | Attending: Radiation Oncology | Admitting: Radiation Oncology

## 2015-11-10 DIAGNOSIS — C3431 Malignant neoplasm of lower lobe, right bronchus or lung: Secondary | ICD-10-CM | POA: Diagnosis not present

## 2015-11-11 ENCOUNTER — Ambulatory Visit
Admission: RE | Admit: 2015-11-11 | Discharge: 2015-11-11 | Disposition: A | Discharge status: Home / Self Care | Payer: Managed Care, Other (non HMO) | Source: Ambulatory Visit | Attending: Radiation Oncology | Admitting: Radiation Oncology

## 2015-11-11 DIAGNOSIS — C3431 Malignant neoplasm of lower lobe, right bronchus or lung: Secondary | ICD-10-CM | POA: Diagnosis not present

## 2015-11-12 ENCOUNTER — Telehealth: Payer: Self-pay | Admitting: *Deleted

## 2015-11-12 ENCOUNTER — Ambulatory Visit
Admission: RE | Admit: 2015-11-12 | Discharge: 2015-11-12 | Disposition: A | Discharge status: Home / Self Care | Payer: Managed Care, Other (non HMO) | Source: Ambulatory Visit | Attending: Radiation Oncology | Admitting: Radiation Oncology

## 2015-11-12 DIAGNOSIS — C3431 Malignant neoplasm of lower lobe, right bronchus or lung: Secondary | ICD-10-CM | POA: Diagnosis not present

## 2015-11-12 MED ORDER — GABAPENTIN 300 MG PO CAPS: 300.0000 mg | ORAL_CAPSULE | Freq: Three times a day (TID) | ORAL | Status: DC

## 2015-11-12 NOTE — Telephone Encounter (Signed)
Escribed

## 2015-11-12 NOTE — Telephone Encounter (Signed)
Refill escribed to [facility] on [address]

## 2015-11-12 NOTE — Telephone Encounter (Signed)
-----   Message from [place] sent at 11/12/2015  8:44 AM EST ----- Her Gabapentin can she have a new presc [place] moved it up to 3 a day now she is only got one more day.

## 2015-11-15 ENCOUNTER — Ambulatory Visit
Admission: RE | Admit: 2015-11-15 | Discharge: 2015-11-15 | Disposition: A | Discharge status: Home / Self Care | Payer: Managed Care, Other (non HMO) | Source: Ambulatory Visit | Attending: Radiation Oncology | Admitting: Radiation Oncology

## 2015-11-15 DIAGNOSIS — C3431 Malignant neoplasm of lower lobe, right bronchus or lung: Secondary | ICD-10-CM | POA: Diagnosis not present

## 2015-11-16 ENCOUNTER — Ambulatory Visit
Admission: RE | Admit: 2015-11-16 | Discharge: 2015-11-16 | Disposition: A | Discharge status: Home / Self Care | Payer: Managed Care, Other (non HMO) | Source: Ambulatory Visit | Attending: Radiation Oncology | Admitting: Radiation Oncology

## 2015-11-16 DIAGNOSIS — C3431 Malignant neoplasm of lower lobe, right bronchus or lung: Secondary | ICD-10-CM | POA: Diagnosis not present

## 2015-11-17 ENCOUNTER — Ambulatory Visit
Admission: RE | Admit: 2015-11-17 | Discharge: 2015-11-17 | Disposition: A | Discharge status: Home / Self Care | Payer: Managed Care, Other (non HMO) | Source: Ambulatory Visit | Attending: Radiation Oncology | Admitting: Radiation Oncology

## 2015-11-17 ENCOUNTER — Ambulatory Visit: Payer: Managed Care, Other (non HMO)

## 2015-11-17 DIAGNOSIS — C3431 Malignant neoplasm of lower lobe, right bronchus or lung: Secondary | ICD-10-CM | POA: Diagnosis not present

## 2015-11-18 ENCOUNTER — Inpatient Hospital Stay: Payer: Managed Care, Other (non HMO) | Attending: Oncology

## 2015-11-18 ENCOUNTER — Inpatient Hospital Stay (HOSPITAL_BASED_OUTPATIENT_CLINIC_OR_DEPARTMENT_OTHER): Payer: Managed Care, Other (non HMO) | Admitting: Oncology

## 2015-11-18 ENCOUNTER — Encounter: Payer: Self-pay | Admitting: Oncology

## 2015-11-18 ENCOUNTER — Ambulatory Visit: Payer: Managed Care, Other (non HMO) | Admitting: Oncology

## 2015-11-18 ENCOUNTER — Other Ambulatory Visit: Payer: Managed Care, Other (non HMO)

## 2015-11-18 ENCOUNTER — Ambulatory Visit: Payer: Managed Care, Other (non HMO)

## 2015-11-18 ENCOUNTER — Ambulatory Visit
Admission: RE | Admit: 2015-11-18 | Discharge: 2015-11-18 | Disposition: A | Discharge status: Home / Self Care | Payer: Managed Care, Other (non HMO) | Source: Ambulatory Visit | Attending: Radiation Oncology | Admitting: Radiation Oncology

## 2015-11-18 VITALS — BP 161/89 | HR 69 | Temp 96.9°F | Wt 111.8 lb

## 2015-11-18 DIAGNOSIS — Z923 Personal history of irradiation: Secondary | ICD-10-CM

## 2015-11-18 DIAGNOSIS — R5383 Other fatigue: Secondary | ICD-10-CM | POA: Diagnosis not present

## 2015-11-18 DIAGNOSIS — G629 Polyneuropathy, unspecified: Secondary | ICD-10-CM | POA: Insufficient documentation

## 2015-11-18 DIAGNOSIS — R531 Weakness: Secondary | ICD-10-CM | POA: Diagnosis not present

## 2015-11-18 DIAGNOSIS — C3431 Malignant neoplasm of lower lobe, right bronchus or lung: Secondary | ICD-10-CM | POA: Diagnosis not present

## 2015-11-18 DIAGNOSIS — Z79899 Other long term (current) drug therapy: Secondary | ICD-10-CM | POA: Insufficient documentation

## 2015-11-18 DIAGNOSIS — F17210 Nicotine dependence, cigarettes, uncomplicated: Secondary | ICD-10-CM | POA: Diagnosis not present

## 2015-11-18 DIAGNOSIS — Z9221 Personal history of antineoplastic chemotherapy: Secondary | ICD-10-CM

## 2015-11-18 LAB — CBC WITH DIFFERENTIAL/PLATELET
Basophils Absolute: 0 10*3/uL (ref 0–0.1)
Basophils Relative: 1 %
EOS ABS: 0.1 10*3/uL (ref 0–0.7)
EOS PCT: 2 %
HEMATOCRIT: 37.1 % (ref 35.0–47.0)
Hemoglobin: 13 g/dL (ref 12.0–16.0)
LYMPHS ABS: 1.3 10*3/uL (ref 1.0–3.6)
LYMPHS PCT: 24 %
MCH: 34.6 pg — ABNORMAL HIGH (ref 26.0–34.0)
MCHC: 34.9 g/dL (ref 32.0–36.0)
MCV: 99.1 fL (ref 80.0–100.0)
Monocytes Absolute: 0.5 10*3/uL (ref 0.2–0.9)
Monocytes Relative: 9 %
Neutro Abs: 3.5 10*3/uL (ref 1.4–6.5)
Neutrophils Relative %: 64 %
PLATELETS: 323 10*3/uL (ref 150–440)
RBC: 3.75 MIL/uL — AB (ref 3.80–5.20)
RDW: 15 % — ABNORMAL HIGH (ref 11.5–14.5)
WBC: 5.3 10*3/uL (ref 3.6–11.0)

## 2015-11-18 LAB — COMPREHENSIVE METABOLIC PANEL
ALT: 12 U/L — AB (ref 14–54)
ANION GAP: 7 (ref 5–15)
AST: 18 U/L (ref 15–41)
Albumin: 3.8 g/dL (ref 3.5–5.0)
Alkaline Phosphatase: 142 U/L — ABNORMAL HIGH (ref 38–126)
BUN: 11 mg/dL (ref 6–20)
CALCIUM: 9.1 mg/dL (ref 8.9–10.3)
CO2: 27 mmol/L (ref 22–32)
Chloride: 102 mmol/L (ref 101–111)
Creatinine, Ser: 0.61 mg/dL (ref 0.44–1.00)
GFR calc non Af Amer: >60 mL/min (ref >60)
GLUCOSE: 94 mg/dL (ref 65–99)
POTASSIUM: 3.8 mmol/L (ref 3.5–5.1)
Sodium: 136 mmol/L (ref 135–145)
TOTAL PROTEIN: 7.7 g/dL (ref 6.5–8.1)
Total Bilirubin: 0.3 mg/dL (ref 0.3–1.2)

## 2015-11-18 LAB — MAGNESIUM: Magnesium: 1.9 mg/dL (ref 1.7–2.4)

## 2015-11-18 NOTE — Progress Notes (Addendum)
[place] @ [place] [phone]  Fax:([phone] [place]: [birth year]  MR#: [id number]  ZHY#:[id number]  Patient Care Team: [place] Pink, MD as PCP - General (Family Medicine)  CHIEF COMPLAINT:  1.  Squamous cell carcinoma of right  LOWER LOBE OF  lung stage IIIA based on PET scan Biopsy of the (August of 2016). 2.  After 3 cycles of chemotherapy patient underwent resection of lung mass (November, 2016) [id number]   ypN0  status post right lower lobectomy with a 3. She  was started on carboplatinum and Taxol on a weekly basis and radiation therapy from January of 2017 4. After 2 cycles of carboplatinum patient had neuropathy grade 2 interfering with work so chemotherapy was put on hold and radiation was continued (January 19th, 2017) 5.  Chemotherapy was discontinued because of progressive neuropathy.  Patient is finishing of radiation therapy on November 19, 2015 VISIT DIAGNOSIS:   No diagnosis found.  CANCER  of lung  No history exists.  INTERVAL HISTORY 60 year old lady came today further follow-up after right lower lobe resection.  Patient and margins were clear.  Tumor size was still 6.3 cm.  There was some chemotherapy effect.  Margins were close but clear.  Multiple station off lymph nodes were evaluated and were negative for any metastatic disease Case was discussed in tumor conference Patient is here for further evaluation and treatment consideration  Patient is here for further follow-up is finishing up radiation therapy.  No cough no shortness of breath.  Patient continues to a problem with tingling and numbness in upper extremity more so in the right hand than the left.  Patient drops things and has difficulty buttoning unbuttoning short period In also feels numb in lower extremity which sometimes interferes with ambulation Appetite has been stable and has not lost significant weight   : REVIEW OF SYSTEMS:    general status:  Patient is feeling weak and tired.  No change in a performance status.  No chills.  No fever. HEENT: .  No evidence of stomatitis Lungs: No cough or shortness of breath . patient is recovering from surgery has soreness in the chest wall area Cardiac: No chest pain or paroxysmal nocturnal dyspnea GI: No nausea no vomiting no diarrhea no abdominal pain Skin: No rash Lower extremity no swelling Neurological system: The numbness in upper and lower extremity and weakness in right upper extremity as described about Musculoskeletal system no bony pains  Review of systems:  All other systems reviewed and found to be negative.  As per HPI. Otherwise, a complete review of systems is negatve.  PAST MEDICAL HISTORY: No significant past medical history.  Denies any diabetes or hypertensio occasional joint pains recently had some swelling of the left ankle area ultrasound was negative for deep vein thrombosis PAST SURGICAL HISTORY: Patient had a hysterectomy.  No other significant past history FAMILY HISTORY There is no significant family history of breast cancer, ovarian cancer, colon cancer GYNECOLOGIC HISTORY:  No LMP recorded. Patient has had a hysterectomy.     ADVANCED DIRECTIVES:  Patient does have advance healthcare directive, Patient   does not desire to make any changes  HEALTH MAINTENANCE: Social History  Substance Use Topics  . Smoking status: Current Every Day Smoker -- 1.00 packs/day for 40 years    Types: Cigarettes  . Smokeless tobacco: None     Comment: "1 pk or less per day"  . Alcohol Use: 1.2 - 2.4 oz/week  0 Standard drinks or equivalent, 2-4 Cans of beer per week     Comment: beer-occasionally      No Known Allergies  Current Outpatient Prescriptions  Medication Sig Dispense Refill  . albuterol (PROAIR HFA) 108 (90 BASE) MCG/ACT inhaler Inhale 2 puffs into the lungs every 6 (six) hours as needed for wheezing or shortness of breath.     . bisacodyl (DULCOLAX) 5  MG EC tablet Take 2 tablets (10 mg total) by mouth daily. 30 tablet 0  . buPROPion (WELLBUTRIN SR) 150 MG 12 hr tablet TAKE 1 TABLET BY MOUTH TWICE DAILY. START 1 TABLET DAILY X 3 DAYS THEN TWICE DAILY    . Ergocalciferol (VITAMIN D2 PO) Take 1 capsule by mouth every morning.    . gabapentin (NEURONTIN) 300 MG capsule Take 1 capsule (300 mg total) by mouth 3 (three) times daily. 90 capsule 3  . lansoprazole (PREVACID) 30 MG capsule Take 1 capsule (30 mg total) by mouth daily at 12 noon. 30 capsule 3  . mometasone-formoterol (DULERA) 200-5 MCG/ACT AERO Use 2 puffs two times daily    . mometasone-formoterol (DULERA) 200-5 MCG/ACT AERO Inhale 2 puffs into the lungs 2 (two) times daily. 1 Inhaler 3  . oxybutynin (DITROPAN-XL) 10 MG 24 hr tablet Take 1 tablet by mouth once daily    . oxyCODONE-acetaminophen (PERCOCET/ROXICET) 5-325 MG tablet Take 1 tablet by mouth every 6 (six) hours as needed for severe pain. 15 tablet 0  . Vitamin D, Ergocalciferol, (DRISDOL) 50000 UNITS CAPS capsule Take by mouth.     No current facility-administered medications for this visit.   Facility-Administered Medications Ordered in Other Visits  Medication Dose Route Frequency Provider Last Rate Last Dose  . heparin lock flush 100 unit/mL  500 Units Intracatheter Once PRN Forest Gleason, MD        OBJECTIVE: PHYSICAL EXAM: GENERAL:  Well developed, well nourished, sitting comfortably in the exam room in no acute distress. MENTAL STATUS:  Alert and oriented to person, place and time.  ENT:  Oropharynx clear without lesion.  Tongue normal. Mucous membranes moist.  RESPIRATORY:  Clear to auscultation without rales, wheezes or rhonchi. CARDIOVASCULAR:  Regular rate and rhythm without murmur, rub or gallop. BREAST:  Right breast without masses, skin changes or nipple discharge.  Left breast without masses, skin changes or nipple discharge. ABDOMEN:  Soft, non-tender, with active bowel sounds, and no hepatosplenomegaly.  No  masses. BACK:  No CVA tenderness.  No tenderness on percussion of the back or rib cage. SKIN:  No rashes, ulcers or lesions. EXTREMITIES: No edema, no skin discoloration or tenderness.  No palpable cords. LYMPH NODES: No palpable cervical, supraclavicular, axillary or inguinal adenopathy  NEUROLOGICAL: Grade 2 neuropathy.  Grade 3 in the right upper extremity. Functional disability with holding and dropping things PSYCH:  Appropriate.                     LYes Yes WBC count 6.4. Hemoglobin is 11.8. Platelets 309. Serum creatinine is within normal limit   ASSESSMENT:  Non-small cell carcinoma of lung, squamous cell.  Right lower lobe T3 N2 M0 tumor stage IIIa Status post right lower lobectomy.  T2b N0 M0 tumor postoperatively margins were clear.  patient has started radiation therapy.  Was getting weekly carboplatinum and Taxol PLAN:   1.  Cancer  of long status post neoadjuvant chemotherapy followed by resection.  Radiation and chemotherapy.  Chemotherapy has been discontinued because of progressive neuropathy Patient is  finishing up radiation therapy on November 19, 2015 2.  Progressive neuropathy particularly in right upper extremity with functional disability of holding things and dropping things May interfere with patients as an nature of work We had prolonged discussion about that patient may want to be off work he permits for next 3-6 weeks 3.  Consider care program and evaluation by physiotherapy for upper extremity neuropathy    Pain   in the chest wall area has improved and patient does not need any narcotic medication    Forest Gleason, MD   11/18/2015 9:03 AM      [place] @ [facility] [phone]  [phone]

## 2015-11-19 ENCOUNTER — Ambulatory Visit
Admission: RE | Admit: 2015-11-19 | Discharge: 2015-11-19 | Disposition: A | Discharge status: Home / Self Care | Payer: Managed Care, Other (non HMO) | Source: Ambulatory Visit | Attending: Radiation Oncology | Admitting: Radiation Oncology

## 2015-11-19 DIAGNOSIS — C3431 Malignant neoplasm of lower lobe, right bronchus or lung: Secondary | ICD-10-CM | POA: Diagnosis not present

## 2015-11-23 ENCOUNTER — Telehealth: Payer: Self-pay | Admitting: *Deleted

## 2015-11-23 NOTE — Telephone Encounter (Signed)
Patient informed that per Dr Oliva Bustard PT will be ordered and that PT should reach out to her in  a few days for an appt. She thanked me for taking care of this. Hayley will enter order

## 2015-11-23 NOTE — Telephone Encounter (Signed)
States that she does not think CARE Program will help her with the neuropathy after speaking with program scheduler. Willing to go for PT specifically for her hands and feet neuropathy and have her insurance billed

## 2015-11-23 NOTE — Telephone Encounter (Signed)
Order for physical therapy and occupational therapy faxed to rehab dept.

## 2015-12-02 ENCOUNTER — Inpatient Hospital Stay: Payer: Managed Care, Other (non HMO)

## 2015-12-02 ENCOUNTER — Inpatient Hospital Stay: Payer: Managed Care, Other (non HMO) | Attending: Family Medicine

## 2015-12-02 DIAGNOSIS — Z9221 Personal history of antineoplastic chemotherapy: Secondary | ICD-10-CM | POA: Insufficient documentation

## 2015-12-02 DIAGNOSIS — Z452 Encounter for adjustment and management of vascular access device: Secondary | ICD-10-CM | POA: Insufficient documentation

## 2015-12-02 DIAGNOSIS — F17210 Nicotine dependence, cigarettes, uncomplicated: Secondary | ICD-10-CM | POA: Insufficient documentation

## 2015-12-02 DIAGNOSIS — Z902 Acquired absence of lung [part of]: Secondary | ICD-10-CM | POA: Insufficient documentation

## 2015-12-02 DIAGNOSIS — C3431 Malignant neoplasm of lower lobe, right bronchus or lung: Secondary | ICD-10-CM | POA: Insufficient documentation

## 2015-12-02 DIAGNOSIS — Z923 Personal history of irradiation: Secondary | ICD-10-CM | POA: Insufficient documentation

## 2015-12-09 ENCOUNTER — Encounter: Payer: Self-pay | Admitting: Physical Therapy

## 2015-12-09 ENCOUNTER — Ambulatory Visit: Payer: Managed Care, Other (non HMO) | Admitting: Physical Therapy

## 2015-12-09 ENCOUNTER — Ambulatory Visit: Payer: Managed Care, Other (non HMO) | Attending: Oncology | Admitting: Occupational Therapy

## 2015-12-09 DIAGNOSIS — R279 Unspecified lack of coordination: Secondary | ICD-10-CM | POA: Insufficient documentation

## 2015-12-09 DIAGNOSIS — R531 Weakness: Secondary | ICD-10-CM | POA: Diagnosis present

## 2015-12-09 DIAGNOSIS — R262 Difficulty in walking, not elsewhere classified: Secondary | ICD-10-CM

## 2015-12-09 DIAGNOSIS — R270 Ataxia, unspecified: Secondary | ICD-10-CM | POA: Diagnosis present

## 2015-12-09 DIAGNOSIS — R278 Other lack of coordination: Secondary | ICD-10-CM

## 2015-12-09 NOTE — Therapy (Signed)
[place] MAIN [facility] SERVICES [address] [place], [place], [address] Phone: [phone]   Fax:  [phone]  Physical Therapy Evaluation  Patient Details  Name: Claudia Chavez MRN: [id number] Date of Birth: 1956/08/14 Referring Provider: Forest Gleason  Encounter Date: 12/09/2015      PT End of Session - 12/09/15 1120    Visit Number 1   Number of Visits 17   Date for PT Re-Evaluation 01/08/2016   Authorization Type g codes   PT Start Time Dec 15, 1113   PT Stop Time 1200   PT Time Calculation (min) 45 min      Past Medical History  Diagnosis Date  . COPD (chronic obstructive pulmonary disease) ([place])   . Pneumonia 04/2015  . Non-small cell lung cancer ([place])   . Cancer of lower lobe of right lung ([place]) 05/07/2015    Past Surgical History  Procedure Laterality Date  . Abdominal hysterectomy    . Portacath placement Right 05/10/2015    Procedure: INSERTION PORT-A-CATH;  Surgeon: Nestor Lewandowsky, MD;  Location: [facility] ORS;  Service: General;  Laterality: Right;  . Elbow surgery Right 1995  . Video assisted thoracoscopy (vats)/thorocotomy Right 08/18/2015    Procedure: VIDEO ASSISTED THORACOSCOPY (VATS)/THOROCOTOMY;  Surgeon: Nestor Lewandowsky, MD;  Location: [facility] ORS;  Service: General;  Laterality: Right;    There were no vitals filed for this visit.  Visit Diagnosis:  Difficulty walking      Subjective Assessment - 12/09/15 1118    Subjective Patient has neuropathy in her hands and feet.    Currently in Pain? No/denies      PAIN: BLE feet pain  POSTURE: WNL    PROM/AROM: WNL  STRENGTH:  Graded on a 0-5 scale Muscle Group Left Right  Shoulder flex    Shoulder Abd    Shoulder Ext    Shoulder IR/ER    Elbow    Wrist/hand    Hip Flex 4 4  Hip Abd 4 4  Hip Add 4 4  Hip Ext 4 4  Hip IR/ER 4 4  Knee Flex 4 4  Knee Ext 4 4  Ankle DF 4 4  Ankle PF 4 4   SENSATION: BLE feet numbness     FUNCTIONAL MOBILITY:  WNL   BALANCE:good dynamic standing and normal static standing   GAIT: WNL without device  OUTCOME MEASURES: TEST Outcome Interpretation  5 times sit<>stand 14.73sec >18 yo, >15 sec indicates increased risk for falls  10 meter walk test 1.35                m/s <1.0 m/s indicates increased risk for falls; limited community ambulator  Timed up and Go   8.83              sec <14 sec indicates increased risk for falls  6 minute walk test   1200             Feet 1000 feet is community ambulator                  [facility] PT Assessment - 12/09/15 1348    Assessment   Medical Diagnosis lung cancer   Referring Provider Forest Gleason   Hand Dominance Right   Balance Screen   Has the patient fallen in the past 6 months No   Has the patient had a decrease in activity level because of a fear of falling?  Yes   Is the patient reluctant to leave  their home because of a fear of falling?  No   Home Ecologist residence   Living Arrangements Spouse/significant other   Available Help at Discharge Family   Type of [place] None   Prior Function   Level of Independence Independent   Vocation Full time employment   Administrator, Civil Service, works with slides, ribbons, plates, blades, and forceps                           PT Education - 10-Dec-2015 1120    Education provided Yes   Education Details HEP   Person(s) Educated Patient   Methods Explanation   Comprehension Verbalized understanding             PT Long Term Goals - 2015-12-10 1332    PT LONG TERM GOAL #1   Title Patient will improve her sensation in the bottom of her feet bilaterally and be able to tolerate longer distances of ambulation.    Time 4   Period Weeks   Status New   PT LONG TERM GOAL #2   Title Patient will be independent wiht HEP to improve strength and circulation to feeg   Time 4   Period Weeks   Status New                Plan - 12-10-2015 1135    Clinical Impression Statement (p) Patient is 60 year old female with recent lung cancer and decreased BLE feet sensaion and difficutly walking.    Pt will benefit from skilled therapeutic intervention in order to improve on the following deficits (p) Decreased activity tolerance;Decreased strength;Pain;Impaired sensation;Difficulty walking   Rehab Potential (p) Good   PT Frequency (p) 2x / week   PT Duration (p) 8 weeks   PT Treatment/Interventions (p) Electrical Stimulation;Ultrasound;Moist Heat;Therapeutic exercise;Dry needling          G-Codes - 2015/12/10 1333    Functional Assessment Tool Used TUG 5 x sit to stand, 6 MW   Functional Limitation Mobility: Walking and moving around   Mobility: Walking and Moving Around Current Status [phone]) At least 1 percent but less than 20 percent impaired, limited or restricted   Mobility: Walking and Moving Around Goal Status [phone]) 0 percent impaired, limited or restricted       Problem List Patient Active Problem List   Diagnosis Date Noted  . Chronic bronchitis ([place]) 10/21/2015  . Bronchogenic lung cancer ([place]) 08/18/2015  . Cancer of lower lobe of right lung ([place]) 05/07/2015  . Bronchitis, chronic ([place]) 04/28/2015  . Detrusor muscle hypertonia 04/28/2015  . Reactive depression (situational) 04/28/2015  . Carpal tunnel syndrome 07/13/2014  . Cervical radiculitis 07/13/2014  . Cervical spinal stenosis 07/13/2014  . Carpal tunnel syndrome of left wrist 07/13/2014  Alanson Puls, PT, DPT  [place] 12-10-15, 1:57 PM  [place] [facility] MAIN [facility] SERVICES [address] [place], [place], [address] Phone: [phone]   Fax:  [phone]  Name: Claudia Chavez MRN: [id number] Date of Birth: 19-Aug-1956

## 2015-12-09 NOTE — Patient Instructions (Signed)
Neuro muscular re-ed  Pt. performed [facility] tasks using the Grooved pegboard. Pt. worked on grasping the grooved pegs from a horizontal position, and moving the pegs to a vertical position in the hand to prepare for placing them in the grooved slot.   There.ex   Pt. performed gross gripping with grip strengthener. Pt. worked on sustaining grip while grasping pegs, and placing them in a container to the left.

## 2015-12-09 NOTE — Therapy (Signed)
[place] MAIN [facility] SERVICES [address] [place], [place], [address] Phone: [phone]   Fax:  [phone]  Occupational Therapy Evaluation  Patient Details  Name: Claudia Chavez MRN: [id number] Date of Birth: 21-Sep-1956 Referring Provider: Dr. Jeb Levering  Encounter Date: 12/09/2015      OT End of Session - 12/09/15 1712    Visit Number 1   Number of Visits 16   Date for OT Re-Evaluation 02/05/16   OT Start Time 1015   OT Stop Time 1115   OT Time Calculation (min) 60 min   Activity Tolerance Patient tolerated treatment well;Patient limited by fatigue   Behavior During Therapy [facility] for tasks assessed/performed      Past Medical History  Diagnosis Date  . COPD (chronic obstructive pulmonary disease) ([place])   . Pneumonia 04/2015  . Non-small cell lung cancer ([place])   . Cancer of lower lobe of right lung ([place]) 05/07/2015    Past Surgical History  Procedure Laterality Date  . Abdominal hysterectomy    . Portacath placement Right 05/10/2015    Procedure: INSERTION PORT-A-CATH;  Surgeon: Nestor Lewandowsky, MD;  Location: [facility] ORS;  Service: General;  Laterality: Right;  . Elbow surgery Right 1995  . Video assisted thoracoscopy (vats)/thorocotomy Right 08/18/2015    Procedure: VIDEO ASSISTED THORACOSCOPY (VATS)/THOROCOTOMY;  Surgeon: Nestor Lewandowsky, MD;  Location: [facility] ORS;  Service: General;  Laterality: Right;    There were no vitals filed for this visit.  Visit Diagnosis:  Loss of coordination - Plan: Ot plan of care cert/re-cert  Weakness generalized - Plan: Ot plan of care cert/re-cert      Subjective Assessment - 12/09/15 1326    Subjective  Pt. reoprts she has not been to this therapy clinic.   Patient is accompained by: Family member   Pertinent History Pt. is a 60 y.o. female with a history of Lung Cancer, who presents to the outpatient clinic for bilateral hand numbness from chemotherapy treaments. Pt. also presents with right hand  weakness, and incoordination.  Pt. has finished chemotherapy, and now is having radiation treatments. Pt. 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    Currently in Pain? No/denies   Pain Score 0-No pain           [facility] OT Assessment - 12/09/15 1717    Assessment   Diagnosis Lung CA   Onset Date 04/10/15   Assessment Chemotherapy August-Nov. 2016, Radiation completed 11/19/15   Balance Screen   Has the patient fallen in the past 6 months No   Is the patient reluctant to leave their home because of a fear of falling?  No   Home  Environment   Family/patient expects to be discharged to: Private residence   Living Arrangements Spouse/significant other   Type of [place] One level   Bathroom Shower/Tub Tub/Shower unit;Walk-in Musician   How accessible Accessible via wheelchair;Accessible via walker   Home Equipment None   Prior Function   Level of Independence Independent   Vocation Full time employment   Administrator, Civil Service, works with slides, ribbons, plates, blades, and forceps   ADL   Eating/Feeding Independent   Grooming Independent   Upper Body Bathing Independent   Lower Body Bathing Independent   Upper Body Dressing Increased time   Lower Body Dressing  Independent   Materials engineer Independent   ADL comments Pt. is having difficulty with buttoning and writing.   IADL   Prior Level of Function Shopping Independent   Prior Level of Function Light Housekeeping Fatigues with light nomemaking   Prior Level of Function Meal Prep Fatigue   Medication Management Is responsible for taking medication in correct dosages at correct time   Written Expression   Dominant Hand Right   Vision - History   Baseline Vision Wears glasses all the time   Observation/Other Assessments   Outcome Measures MAM-20 score 63.3 With  difficulty cutting nails, tying shoes, cutting meat, buttoning clothes, writing 3-4 lines legibly, taking cards out of a wallet, picking up a pitcher  and pouring, dialing phone numbers   Coordination   Right 9 Hole Peg Test 35 sec   Left 9 Hole Peg Test 29 sec   Edema   Edema Occassional Edema in the morning upon arrising in both hands, with the left being worse. no Edema at eval   Hand Function   Right Hand Grip (lbs) 13   Right Hand Lateral Pinch 5 lbs   Right Hand 3 Point Pinch 5 lbs   Left Hand Grip (lbs) 25   Left Hand Lateral Pinch 11 lbs   Left 3 point pinch 9 lbs   Sensation Exercises   Stereognosis intact   Sensory Retraining numbness a tingling at the tips of the digits                           OT Short Term Goals - 12/09/15 1714    OT SHORT TERM GOAL #1   Title Pt. will improve right hand coordination skills  by 3 sec. of speed to be able to dial phone numbers.    Baseline Pt. has difficulty   Time 8   Period Weeks   Status New   OT SHORT TERM GOAL #2   Title Pt. will improve right grip strength by 5# to be able to hold and pour from a coffee pot.   Baseline Pt. has difficulty   Time 8   Period Weeks   Status New   OT SHORT TERM GOAL #3   Title Pt. will improve right lateral pinch strength by 2# to be able to cut meat.   Baseline Pt. has diificulty   Time 8   Period Weeks   Status New   OT SHORT TERM GOAL #4   Title Pt. will improve 3pt. pinch strength by 2# to be able to handle and count money/change.   Baseline Pt. has difficulty   Time 8   Period Weeks   Status New   OT SHORT TERM GOAL #5   Title Pt. will write 3 lines legibly while having a secure grasp on the pen 100% of the time.   Baseline Pt. has difficulty   Time 8   Period Weeks   Status New   Additional Short Term Goals   Additional Short Term Goals Yes   OT SHORT TERM GOAL #6   Title Pt. will demonstrate compensatory techniques for sensory impairments in bilateral hands  100% of the time during ADL and IADL tasks.    Baseline Does not use   Time 8   Period Weeks   Status New           OT Long Term Goals - 12/09/15 1734    OT LONG TERM GOAL #1  Title To reach her maximal potential with bilateral UE and hand use during ADL/IADLs.   Baseline Pt. has difficulty   Time 8   Period Weeks   Status New               Plan - 12/09/15 1336    Clinical Impression Statement Pt. presents with numbness  and tingling in bilateral hands and digits. Pt. also presents with impaired grip and pinch strength, and coordination which hinders her ability to carry out ADL and IADL tasks. Pt. has difficulty writing 3-4 lines legibly, handling and counting money, holding and pouring a pitcher of water, dialing phone numbers, opening a medicine bottle, wringing out a towel, tying shoelaces, cutting meat, and using a nail clipper.   Pt will benefit from skilled therapeutic intervention in order to improve on the following deficits (Retired) Impaired UE functional use;Decreased range of motion;Pain;Impaired flexibility;Decreased coordination;Decreased activity tolerance;Decreased strength   Rehab Potential Good   OT Frequency 2x / week   OT Duration 8 weeks   OT Treatment/Interventions Self-care/ADL training   Consulted and Agree with Plan of Care Patient        Problem List Patient Active Problem List   Diagnosis Date Noted  . Chronic bronchitis ([place]) 10/21/2015  . Bronchogenic lung cancer ([place]) 08/18/2015  . Cancer of lower lobe of right lung ([place]) 05/07/2015  . Bronchitis, chronic ([place]) 04/28/2015  . Detrusor muscle hypertonia 04/28/2015  . Reactive depression (situational) 04/28/2015  . Carpal tunnel syndrome 07/13/2014  . Cervical radiculitis 07/13/2014  . Cervical spinal stenosis 07/13/2014  . Carpal tunnel syndrome of left wrist 07/13/2014   Harrel Carina, MS, OTR/L   Harrel Carina 12/09/2015, 5:42 PM  [place] MAIN [facility] SERVICES [address] [place], [place], [address] Phone: [phone]   Fax:  [phone]  Name: Claudia Chavez MRN: [id number] Date of Birth: 09-25-1956

## 2015-12-14 ENCOUNTER — Ambulatory Visit: Payer: Managed Care, Other (non HMO) | Admitting: Physical Therapy

## 2015-12-14 ENCOUNTER — Inpatient Hospital Stay: Payer: Managed Care, Other (non HMO)

## 2015-12-14 ENCOUNTER — Ambulatory Visit: Payer: Managed Care, Other (non HMO) | Admitting: Occupational Therapy

## 2015-12-14 ENCOUNTER — Encounter: Payer: Self-pay | Admitting: Physical Therapy

## 2015-12-14 DIAGNOSIS — R531 Weakness: Secondary | ICD-10-CM

## 2015-12-14 DIAGNOSIS — R279 Unspecified lack of coordination: Secondary | ICD-10-CM

## 2015-12-14 DIAGNOSIS — C3431 Malignant neoplasm of lower lobe, right bronchus or lung: Secondary | ICD-10-CM | POA: Diagnosis not present

## 2015-12-14 DIAGNOSIS — Z9221 Personal history of antineoplastic chemotherapy: Secondary | ICD-10-CM | POA: Diagnosis not present

## 2015-12-14 DIAGNOSIS — Z452 Encounter for adjustment and management of vascular access device: Secondary | ICD-10-CM | POA: Diagnosis not present

## 2015-12-14 DIAGNOSIS — C801 Malignant (primary) neoplasm, unspecified: Secondary | ICD-10-CM

## 2015-12-14 DIAGNOSIS — Z923 Personal history of irradiation: Secondary | ICD-10-CM | POA: Diagnosis not present

## 2015-12-14 DIAGNOSIS — Z902 Acquired absence of lung [part of]: Secondary | ICD-10-CM | POA: Diagnosis not present

## 2015-12-14 DIAGNOSIS — R262 Difficulty in walking, not elsewhere classified: Secondary | ICD-10-CM

## 2015-12-14 DIAGNOSIS — F17210 Nicotine dependence, cigarettes, uncomplicated: Secondary | ICD-10-CM | POA: Diagnosis not present

## 2015-12-14 DIAGNOSIS — R270 Ataxia, unspecified: Secondary | ICD-10-CM | POA: Diagnosis not present

## 2015-12-14 DIAGNOSIS — R278 Other lack of coordination: Secondary | ICD-10-CM

## 2015-12-14 MED ORDER — HEPARIN SOD (PORK) LOCK FLUSH 100 UNIT/ML IV SOLN
500.0000 [IU] | Freq: Once | INTRAVENOUS | Status: AC
  Administered 2015-12-14: 500 [IU] via INTRAVENOUS

## 2015-12-14 MED ORDER — SODIUM CHLORIDE 0.9% FLUSH
10.0000 mL | INTRAVENOUS | Status: DC | PRN
  Administered 2015-12-14: 10 mL via INTRAVENOUS
  Filled 2015-12-14: qty 10

## 2015-12-14 NOTE — Progress Notes (Signed)
Survivorship Care Plan completed.  Treatment summary reviewed and given to patient.  ASCO answers booklet reviewed and given to patient.  CARE program and Cancer Transitions discussed with patient along with other resources cancer center offers.  Patient verbalized understanding.

## 2015-12-14 NOTE — Therapy (Signed)
[place] MAIN [facility] SERVICES [address] [place], [place], [address] Phone: [phone]   Fax:  [phone]  Occupational Therapy Treatment  Patient Details  Name: Claudia Chavez MRN: [id number] Date of Birth: 04-27-1956 Referring Provider: Dr. Jeb Levering  Encounter Date: 12/14/2015      OT End of Session - 12/14/15 0934    Visit Number 2   Number of Visits 16   Date for OT Re-Evaluation 02/05/16   OT Start Time 0915   OT Stop Time 1000   OT Time Calculation (min) 45 min   Activity Tolerance Patient tolerated treatment well   Behavior During Therapy [facility] for tasks assessed/performed      Past Medical History  Diagnosis Date  . COPD (chronic obstructive pulmonary disease) ([place])   . Pneumonia 04/2015  . Non-small cell lung cancer ([place])   . Cancer of lower lobe of right lung ([place]) 05/07/2015    Past Surgical History  Procedure Laterality Date  . Abdominal hysterectomy    . Portacath placement Right 05/10/2015    Procedure: INSERTION PORT-A-CATH;  Surgeon: Nestor Lewandowsky, MD;  Location: [facility] ORS;  Service: General;  Laterality: Right;  . Elbow surgery Right 1995  . Video assisted thoracoscopy (vats)/thorocotomy Right 08/18/2015    Procedure: VIDEO ASSISTED THORACOSCOPY (VATS)/THOROCOTOMY;  Surgeon: Nestor Lewandowsky, MD;  Location: [facility] ORS;  Service: General;  Laterality: Right;    There were no vitals filed for this visit.  Visit Diagnosis:  Lack of coordination  Weakness generalized      Subjective Assessment - 12/14/15 0933    Subjective  Pt. reports she is planning to go back to work on 01/03/16   Pertinent History Pt. is a 60 y.o. female with a history of Lung Cancer, who presents to the outpatient clinic for bilateral hand numbness from chemotherapy treaments. Pt. also presents with right hand weakness, and incoordination.  Pt. has finished chemotherapy, and now is having radiation treatments. Pt. 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    Currently in Pain? No/denies   Pain Score 0-No pain                      OT Treatments/Exercises (OP) - 12/14/15 1007    ADLs   ADL Comments Pt. worked on removing coins and change from a change purse. Pt. Had difficulty grasping change from  smaller change purse.   Fine Motor Coordination   Other Fine Motor Exercises Pt. performed [place] tasks using the Grooved pegboard. Pt. worked on grasping the grooved pegs from a horizontal position, and moving the pegs to a vertical position in the hand to prepare for placing them in the grooved slot. Reversed with alternating thumb to 2nd through 5th digit.   Neurological Re-education Exercises   Other Exercises 1 Pt. performed gross gripping with grip strengthener. Pt. worked on sustaining grip while grasping pegs and reaching at various heights. Performed in sitting.Pt. Performed pink theraputty ex. For gross grasping, thumb opposition, gross digit extension, lateral pinch, and three point pinch. Reviewed grasping for objects within putty. Pt. worked on pinch strengthening in the left hand for lateral pinch using red and yellow resistive clips.Tactlie and verbal cues were required for eliciting the desired movement.   Sensation Exercises   Sensory Retraining Sensory activities matching fabric with vision occluded. Pt. Required increased time to match corduroy fabric, terry cloth,  and satin/nylon.                  OT Short Term Goals - 12/09/15 1714    OT SHORT TERM GOAL #1   Title Pt. will improve right hand coordination skills  by 3 sec. of speed to be able to dial phone numbers.    Baseline Pt. has difficulty   Time 8   Period Weeks   Status New   OT SHORT TERM GOAL #2   Title Pt. will improve right grip strength by 5# to be able to hold and pour from a coffee pot.   Baseline Pt. has difficulty   Time 8   Period Weeks    Status New   OT SHORT TERM GOAL #3   Title Pt. will improve right lateral pinch strength by 2# to be able to cut meat.   Baseline Pt. has diificulty   Time 8   Period Weeks   Status New   OT SHORT TERM GOAL #4   Title Pt. will improve 3pt. pinch strength by 2# to be able to handle and count money/change.   Baseline Pt. has difficulty   Time 8   Period Weeks   Status New   OT SHORT TERM GOAL #5   Title Pt. will write 3 lines legibly while having a secure grasp on the pen 100% of the time.   Baseline Pt. has difficulty   Time 8   Period Weeks   Status New   Additional Short Term Goals   Additional Short Term Goals Yes   OT SHORT TERM GOAL #6   Title Pt. will demonstrate compensatory techniques for sensory impairments in bilateral hands 100% of the time during ADL and IADL tasks.    Baseline Does not use   Time 8   Period Weeks   Status New           OT Long Term Goals - 12/09/15 1734    OT LONG TERM GOAL #1   Title To reach her maximal potential with bilateral UE and hand use during ADL/IADLs.   Baseline Pt. has difficulty   Time 8   Period Weeks   Status New               Plan - 12/14/15 0935    Clinical Impression Statement Pt. continues to present with impaired hand grip strength, pinch strength, and coordination with numbness and tingling in fingers which hinders her ability to use her hands for ADL/IADLs that require detailed/refined motion such as working with cutting and prparing tissue slides at work., writing, and handling small objects.    Pt will benefit from skilled therapeutic intervention in order to improve on the following deficits (Retired) Impaired UE functional use;Decreased range of motion;Pain;Impaired flexibility;Decreased coordination;Decreased activity tolerance;Decreased strength   Rehab Potential Good   OT Frequency 2x / week   OT Duration 8 weeks   OT Treatment/Interventions Self-care/ADL training;Neuromuscular education;Therapeutic  exercise;Therapeutic exercises;Therapeutic activities   Plan Continue to focus on improving hand function for use in work related tasks        Problem List Patient Active Problem List   Diagnosis Date Noted  . Chronic bronchitis ([place]) 10/21/2015  . Bronchogenic lung cancer ([place]) 08/18/2015  . Cancer of lower lobe of right lung ([place]) 05/07/2015  . Bronchitis, chronic ([place]) 04/28/2015  . Detrusor muscle hypertonia 04/28/2015  . Reactive depression (situational) 04/28/2015  . Carpal tunnel syndrome 07/13/2014  . Cervical radiculitis 07/13/2014  .  Cervical spinal stenosis 07/13/2014  . Carpal tunnel syndrome of left wrist 07/13/2014   Harrel Carina, MS, OTR/L  Harrel Carina 12/14/2015, 12:03 PM  [place] MAIN [facility] SERVICES [address] [place], [place], [address] Phone: [phone]   Fax:  [phone]  Name: ASTRYD PEARCY MRN: [id number] Date of Birth: 07/07/1956

## 2015-12-14 NOTE — Therapy (Signed)
[place] MAIN [facility] SERVICES [address] [place], [place], [address] Phone: [phone]   Fax:  [phone]  Physical Therapy Treatment  Patient Details  Name: Claudia Chavez MRN: [id number] Date of Birth: 05-17-1956 Referring Provider: Forest Gleason  Encounter Date: 12/14/2015      PT End of Session - 12/14/15 0909    Visit Number 2   Number of Visits 17   Date for PT Re-Evaluation 01/19/16   Authorization Type g codes   PT Start Time 0830   PT Stop Time 0900   PT Time Calculation (min) 30 min      Past Medical History  Diagnosis Date  . COPD (chronic obstructive pulmonary disease) ([place])   . Pneumonia 04/2015  . Non-small cell lung cancer ([place])   . Cancer of lower lobe of right lung ([place]) 05/07/2015    Past Surgical History  Procedure Laterality Date  . Abdominal hysterectomy    . Portacath placement Right 05/10/2015    Procedure: INSERTION PORT-A-CATH;  Surgeon: Nestor Lewandowsky, MD;  Location: [facility] ORS;  Service: General;  Laterality: Right;  . Elbow surgery Right 1995  . Video assisted thoracoscopy (vats)/thorocotomy Right 08/18/2015    Procedure: VIDEO ASSISTED THORACOSCOPY (VATS)/THOROCOTOMY;  Surgeon: Nestor Lewandowsky, MD;  Location: [facility] ORS;  Service: General;  Laterality: Right;    There were no vitals filed for this visit.  Visit Diagnosis:  Difficulty walking  Loss of coordination  Weakness generalized      Subjective Assessment - 12/14/15 0907    Subjective Patient has neuropathy in her hands and feet. She says that they are painful and feel like she is walking on a ball.    Currently in Pain? Yes   Pain Score 2    Pain Location Foot   Pain Orientation Right;Left     [place] to BLE feet  at 1.5 cm squared x 25 minutes with no reports of discomfort. HEP was reviewed. Patient reports feeling 2/10 pain to feet and feeling like she is walking on a ball under her toes.                             PT Education - 12/14/15 0908    Education provided Yes   Education Details plan of care   Person(s) Educated Patient   Methods Explanation   Comprehension Verbalized understanding             PT Long Term Goals - 12/09/15 1332    PT LONG TERM GOAL #1   Title Patient will improve her sensation in the bottom of her feet bilaterally and be able to tolerate longer distances of ambulation.    Time 4   Period Weeks   Status New   PT LONG TERM GOAL #2   Title Patient will be independent wiht HEP to improve strength and circulation to feeg   Time 4   Period Weeks   Status New               Plan - 12/14/15 0910    Clinical Impression Statement Patient has decreased sensation and reports of pain and [place] was performed bilaterally and review of HEP.    Pt will benefit from skilled therapeutic intervention in order to improve on the following deficits Decreased activity tolerance;Decreased strength;Pain;Impaired sensation;Difficulty walking   Rehab Potential Good   PT Frequency 2x / week   PT Duration 8 weeks  PT Treatment/Interventions Electrical Stimulation;Ultrasound;Moist Heat;Therapeutic exercise;Dry needling        Problem List Patient Active Problem List   Diagnosis Date Noted  . Chronic bronchitis ([place]) 10/21/2015  . Bronchogenic lung cancer ([place]) 08/18/2015  . Cancer of lower lobe of right lung ([place]) 05/07/2015  . Bronchitis, chronic ([place]) 04/28/2015  . Detrusor muscle hypertonia 04/28/2015  . Reactive depression (situational) 04/28/2015  . Carpal tunnel syndrome 07/13/2014  . Cervical radiculitis 07/13/2014  . Cervical spinal stenosis 07/13/2014  . Carpal tunnel syndrome of left wrist 07/13/2014  Alanson Puls, PT, DPT  [place] 12/14/2015, 9:11 AM  [place] MAIN [facility] SERVICES [address] [place], [place], [address] Phone: [phone]   Fax:  [phone]  Name: LIZBET CIRRINCIONE MRN:  [id number] Date of Birth: May 21, 1956

## 2015-12-15 ENCOUNTER — Other Ambulatory Visit: Payer: Self-pay | Admitting: *Deleted

## 2015-12-15 ENCOUNTER — Encounter: Payer: Self-pay | Admitting: Radiation Oncology

## 2015-12-15 ENCOUNTER — Ambulatory Visit
Admission: RE | Admit: 2015-12-15 | Discharge: 2015-12-15 | Disposition: A | Discharge status: Home / Self Care | Payer: Managed Care, Other (non HMO) | Source: Ambulatory Visit | Attending: Radiation Oncology | Admitting: Radiation Oncology

## 2015-12-15 ENCOUNTER — Ambulatory Visit
Admission: RE | Admit: 2015-12-15 | Discharge: 2015-12-15 | Disposition: A | Discharge status: Home / Self Care | Payer: Managed Care, Other (non HMO) | Source: Ambulatory Visit | Attending: Oncology | Admitting: Oncology

## 2015-12-15 VITALS — BP 137/84 | HR 82 | Temp 96.7°F | Resp 18 | Wt 112.5 lb

## 2015-12-15 DIAGNOSIS — C3431 Malignant neoplasm of lower lobe, right bronchus or lung: Secondary | ICD-10-CM

## 2015-12-15 DIAGNOSIS — Z08 Encounter for follow-up examination after completed treatment for malignant neoplasm: Secondary | ICD-10-CM | POA: Diagnosis present

## 2015-12-15 DIAGNOSIS — J948 Other specified pleural conditions: Secondary | ICD-10-CM | POA: Diagnosis not present

## 2015-12-15 DIAGNOSIS — R918 Other nonspecific abnormal finding of lung field: Secondary | ICD-10-CM | POA: Diagnosis not present

## 2015-12-15 DIAGNOSIS — J449 Chronic obstructive pulmonary disease, unspecified: Secondary | ICD-10-CM | POA: Diagnosis not present

## 2015-12-15 NOTE — Progress Notes (Signed)
Radiation Oncology Follow up Note  Name: Claudia Chavez   Date:   12/15/2015 MRN:  [id number] DOB: Mar 03, 1956    This 60 y.o. 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  REFERRING PROVIDER: [place] Pink, MD  HPI: Patient is a 60 year old female now out 1 month having completed adjuvant radiation therapy to her right chest status post neoadjuvant chemotherapy for a T3 N2 M0 squamous cell carcinoma of the right lung status post new adjuvant chemotherapy than right lower lobectomy with surgical pathology showing close margin at the staple line near in the pleura with her overall 6.3 cm tumor and she received adjuvant radiation therapy. She is now 1 month out and doing well. She specifically denies cough hemoptysis chest tightness or any dysphagia.  COMPLICATIONS OF TREATMENT: none  FOLLOW UP COMPLIANCE: keeps appointments   PHYSICAL EXAM:  BP 137/84 mmHg  Pulse 82  Temp(Src) 96.7 F (35.9 C)  Resp 18  Wt 112 lb 8.7 oz (51.05 kg) Thin well-developed female in NAD. Well-developed well-nourished patient in NAD. HEENT reveals PERLA, EOMI, discs not visualized.  Oral cavity is clear. No oral mucosal lesions are identified. Neck is clear without evidence of cervical or supraclavicular adenopathy. Lungs are clear to A&P. Cardiac examination is essentially unremarkable with regular rate and rhythm without murmur rub or thrill. Abdomen is benign with no organomegaly or masses noted. Motor sensory and DTR levels are equal and symmetric in the upper and lower extremities. Cranial nerves II through XII are grossly intact. Proprioception is intact. No peripheral adenopathy or edema is identified. No motor or sensory levels are noted. Crude visual fields are within normal  range.  RADIOLOGY RESULTS: Chest x-ray performed today showed a nodular density in the right suprahilar region partially obscured by the Port-A-Cath will follow-up with a CT scan ordered by medical oncology  PLAN: Present time patient is doing well she has follow-up appointments with medical oncology which we have confirmed. Will ask them to order CT scan over the next several weeks which we have confirmed. I have asked to see her back in 3-4 months for follow-up. Should CT scan show any significant abnormality will reevaluate. Patient is to call sooner with any concerns.  I would like to take this opportunity for allowing me to participate in the care of your patient.Armstead Peaks., MD

## 2015-12-15 NOTE — Telephone Encounter (Signed)
Has refills left

## 2015-12-16 ENCOUNTER — Ambulatory Visit: Payer: Managed Care, Other (non HMO) | Admitting: Physical Therapy

## 2015-12-16 ENCOUNTER — Other Ambulatory Visit: Payer: Self-pay | Admitting: *Deleted

## 2015-12-16 ENCOUNTER — Encounter: Payer: Self-pay | Admitting: Physical Therapy

## 2015-12-16 ENCOUNTER — Telehealth: Payer: Self-pay | Admitting: *Deleted

## 2015-12-16 DIAGNOSIS — R270 Ataxia, unspecified: Secondary | ICD-10-CM | POA: Diagnosis not present

## 2015-12-16 DIAGNOSIS — R531 Weakness: Secondary | ICD-10-CM

## 2015-12-16 DIAGNOSIS — R262 Difficulty in walking, not elsewhere classified: Secondary | ICD-10-CM

## 2015-12-16 DIAGNOSIS — R279 Unspecified lack of coordination: Secondary | ICD-10-CM

## 2015-12-16 MED ORDER — LANSOPRAZOLE 30 MG PO CPDR: 30.0000 mg | DELAYED_RELEASE_CAPSULE | Freq: Every day | ORAL | Status: DC

## 2015-12-16 MED ORDER — GABAPENTIN 300 MG PO CAPS: 300.0000 mg | ORAL_CAPSULE | Freq: Three times a day (TID) | ORAL | Status: DC

## 2015-12-16 NOTE — Telephone Encounter (Signed)
Refill for gabapentin escribed [facility] per pt request.

## 2015-12-16 NOTE — Telephone Encounter (Signed)
-----   Message from Daiva Huge, RN sent at 12/16/2015  8:36 AM EDT ----- Regarding: Gabapentin Ms. Sauser is asking for her Rx to be sent to [facility] order pharmacy.  It will need to be 90 day rx.   Thanks

## 2015-12-16 NOTE — Therapy (Signed)
[place] MAIN [facility] SERVICES [address] [place], [place], [address] Phone: [phone]   Fax:  [phone]  Physical Therapy Treatment  Patient Details  Name: Claudia Chavez MRN: [id number] Date of Birth: [birth year] Referring Provider: Forest Gleason  Encounter Date: 12/16/2015      PT End of Session - 12/16/15 1148    Visit Number 3   Number of Visits 17   Date for PT Re-Evaluation Jan 24, 2016   Authorization Type g codes   PT Start Time 1110   PT Stop Time 1135   PT Time Calculation (min) 25 min      Past Medical History  Diagnosis Date  . COPD (chronic obstructive pulmonary disease) ([place])   . Pneumonia 04/2015  . Non-small cell lung cancer ([place])   . Cancer of lower lobe of right lung ([place]) 05/07/2015    Past Surgical History  Procedure Laterality Date  . Abdominal hysterectomy    . Portacath placement Right 05/10/2015    Procedure: INSERTION PORT-A-CATH;  Surgeon: Nestor Lewandowsky, MD;  Location: [facility] ORS;  Service: General;  Laterality: Right;  . Elbow surgery Right 1995  . Video assisted thoracoscopy (vats)/thorocotomy Right 08/18/2015    Procedure: VIDEO ASSISTED THORACOSCOPY (VATS)/THOROCOTOMY;  Surgeon: Nestor Lewandowsky, MD;  Location: [facility] ORS;  Service: General;  Laterality: Right;    There were no vitals filed for this visit.  Visit Diagnosis:  Lack of coordination  Weakness generalized  Difficulty walking      Subjective Assessment - 12/16/15 1147    Subjective Patient has neuropathy in her hands and feet. She says that they are painful and feel like she is walking on a ball.      [place] to BLE feet x 8 minutes x 2  Reviewed HEP No change in sensation following treatment.                             PT Education - 12/16/15 1148    Education provided Yes   Education Details plan of care   Person(s) Educated Patient   Methods Explanation   Comprehension Verbalized understanding              PT Long Term Goals - 12/09/15 1332    PT LONG TERM GOAL #1   Title Patient will improve her sensation in the bottom of her feet bilaterally and be able to tolerate longer distances of ambulation.    Time 4   Period Weeks   Status New   PT LONG TERM GOAL #2   Title Patient will be independent wiht HEP to improve strength and circulation to feeg   Time 4   Period Weeks   Status New               Plan - 12/16/15 1148    Clinical Impression Statement Patient has pain in BLE feet and numbness and it is constant. She tolerated [place] to the bottom of both feet with no change in sensation.    Pt will benefit from skilled therapeutic intervention in order to improve on the following deficits Decreased activity tolerance;Decreased strength;Pain;Impaired sensation;Difficulty walking   Rehab Potential Good   PT Frequency 2x / week   PT Duration 8 weeks   PT Treatment/Interventions Electrical Stimulation;Ultrasound;Moist Heat;Therapeutic exercise;Dry needling        Problem List Patient Active Problem List   Diagnosis Date Noted  . Chronic bronchitis ([place]) 10/21/2015  .  Bronchogenic lung cancer ([place]) 08/18/2015  . Cancer of lower lobe of right lung ([place]) 05/07/2015  . Bronchitis, chronic ([place]) 04/28/2015  . Detrusor muscle hypertonia 04/28/2015  . Reactive depression (situational) 04/28/2015  . Carpal tunnel syndrome 07/13/2014  . Cervical radiculitis 07/13/2014  . Cervical spinal stenosis 07/13/2014  . Carpal tunnel syndrome of left wrist 07/13/2014   Alanson Puls, PT, DPT [place] 12/16/2015, 12:07 PM  [place] MAIN [facility] SERVICES [address] [place], [place], [address] Phone: [phone]   Fax:  [phone]  Name: Claudia Chavez MRN: [id number] Date of Birth: May 16, 1956

## 2015-12-16 NOTE — Addendum Note (Signed)
Addended by: Jarrett Soho C on: 12/16/2015 11:06 AM   Modules accepted: Orders

## 2015-12-20 ENCOUNTER — Telehealth: Payer: Self-pay | Admitting: *Deleted

## 2015-12-20 NOTE — Telephone Encounter (Signed)
Provider statement was faxed to our clinic on 11/22/15 and [employer] has not received it back yet. Will need form today or recent MD progress note for pt to continue getting reimbursed through disability. Last MD note was faxed to [place] at [phone].   Pt aware.

## 2015-12-21 ENCOUNTER — Encounter: Payer: Self-pay | Admitting: Occupational Therapy

## 2015-12-21 ENCOUNTER — Ambulatory Visit: Payer: Managed Care, Other (non HMO) | Admitting: Physical Therapy

## 2015-12-21 ENCOUNTER — Ambulatory Visit: Payer: Managed Care, Other (non HMO) | Admitting: Occupational Therapy

## 2015-12-21 ENCOUNTER — Encounter: Payer: Self-pay | Admitting: Physical Therapy

## 2015-12-21 DIAGNOSIS — R531 Weakness: Secondary | ICD-10-CM

## 2015-12-21 DIAGNOSIS — R262 Difficulty in walking, not elsewhere classified: Secondary | ICD-10-CM

## 2015-12-21 DIAGNOSIS — R279 Unspecified lack of coordination: Secondary | ICD-10-CM

## 2015-12-21 DIAGNOSIS — R270 Ataxia, unspecified: Secondary | ICD-10-CM | POA: Diagnosis not present

## 2015-12-21 NOTE — Therapy (Signed)
[place] MAIN [facility] SERVICES [address] [place], [place], [address] Phone: [phone]   Fax:  [phone]  Occupational Therapy Treatment  Patient Details  Name: Claudia Chavez MRN: [id number] Date of Birth: Jul 21, 1956 Referring Provider: Dr. Jeb Levering  Encounter Date: 12/21/2015      OT End of Session - 12/21/15 1329    Visit Number 3   Number of Visits 16   Date for OT Re-Evaluation 02/05/16   OT Start Time 0916   OT Stop Time 1010   OT Time Calculation (min) 54 min   Activity Tolerance Patient tolerated treatment well   Behavior During Therapy [facility] for tasks assessed/performed      Past Medical History  Diagnosis Date  . COPD (chronic obstructive pulmonary disease) ([place])   . Pneumonia 04/2015  . Non-small cell lung cancer ([place])   . Cancer of lower lobe of right lung ([place]) 05/07/2015    Past Surgical History  Procedure Laterality Date  . Abdominal hysterectomy    . Portacath placement Right 05/10/2015    Procedure: INSERTION PORT-A-CATH;  Surgeon: Nestor Lewandowsky, MD;  Location: [facility] ORS;  Service: General;  Laterality: Right;  . Elbow surgery Right 1995  . Video assisted thoracoscopy (vats)/thorocotomy Right 08/18/2015    Procedure: VIDEO ASSISTED THORACOSCOPY (VATS)/THOROCOTOMY;  Surgeon: Nestor Lewandowsky, MD;  Location: [facility] ORS;  Service: General;  Laterality: Right;    There were no vitals filed for this visit.  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  She is afraid she won't be able to manage the forceps and cannot risk dropping any tissue samples since some of them are really small and limited in quantity.    Pertinent History Pt. is a 60 y.o. female with a history of Lung Cancer, who presents to the  outpatient clinic for bilateral hand numbness from chemotherapy treaments. Pt. also presents with right hand weakness, and incoordination.  Pt. has finished chemotherapy, and now is having radiation treatments. Pt. 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    Patient Stated Goals Patient wants to be independent with all daily tasks including being able to return to her job at [employer].   Currently in Pain? No/denies   Pain Score 0-No pain   Multiple Pain Sites No                      OT Treatments/Exercises (OP) - 12/21/15 1331    ADLs   ADL Comments Discussed work tasks with use of forceps and tissue samples, cutting and requirements of job since patient is concerned about return to work soon with the numbness and tingling in her hands, worried she will drop the samples.     Fine Motor Coordination   Other Fine Motor Exercises Patient seen this date for manipulation of nuts and bolts from elevated plane of motion with cues, variety of sizes performed from large to small.  Purdue task manipulation with small dowels, collars and washers to assemble and deassemble with use of palmar surface of the hand for storage, emphasis on translatory movements of the hand from tip to palm and back.  Patient also instructed on use of cards at home for picking up flat objects from table top, flipping cards, shuffling,dealing and using a combination of thumb and finger movements to improve coordination skills.    Neurological Re-education Exercises   Other Exercises 1 Patient seen this date for bilateral hand strengthening with sustained gripping exercise with 11# of pressure, 25 reps each side.  Resistive putty exercises medium red for grip, lateral pinch, 3 point pinch and 2 point pinch skills.    Sensation Exercises   Sensory Retraining Sensory activities matching fabric with vision occluded. Pt. Required  increased time to match corduroy fabric, terry cloth, and satin/nylon.                OT Education - 12/21/15 1328    Education provided Yes   Education Details HEP, coordination and strengthening tasks, discussed use of forceps for work tasks.    Person(s) Educated Patient   Methods Explanation;Demonstration;Verbal cues   Comprehension Verbal cues required;Returned demonstration;Verbalized understanding          OT Short Term Goals - 12/09/15 1714    OT SHORT TERM GOAL #1   Title Pt. will improve right hand coordination skills  by 3 sec. of speed to be able to dial phone numbers.    Baseline Pt. has difficulty   Time 8   Period Weeks   Status New   OT SHORT TERM GOAL #2   Title Pt. will improve right grip strength by 5# to be able to hold and pour from a coffee pot.   Baseline Pt. has difficulty   Time 8   Period Weeks   Status New   OT SHORT TERM GOAL #3   Title Pt. will improve right lateral pinch strength by 2# to be able to cut meat.   Baseline Pt. has diificulty   Time 8   Period Weeks   Status New   OT SHORT TERM GOAL #4   Title Pt. will improve 3pt. pinch strength by 2# to be able to handle and count money/change.   Baseline Pt. has difficulty   Time 8   Period Weeks   Status New   OT SHORT TERM GOAL #5   Title Pt. will write 3 lines legibly while having a secure grasp on the pen 100% of the time.   Baseline Pt. has difficulty   Time 8   Period Weeks   Status New   Additional Short Term Goals   Additional Short Term Goals Yes   OT SHORT TERM GOAL #6   Title Pt. will demonstrate compensatory techniques for sensory impairments in bilateral hands 100% of the time during ADL and IADL tasks.    Baseline Does not use   Time 8   Period Weeks   Status New           OT Long Term Goals - 12/09/15 1734    OT LONG TERM GOAL #1   Title To reach her maximal potential with bilateral UE and hand use during ADL/IADLs.   Baseline Pt. has difficulty   Time  8   Period Weeks   Status New               Plan - 12/21/15 1330    Clinical Impression Statement Patient presents with numbness and tingling in bilateral UEs and is concerned about returning to work soon.  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  Discussed her job duties and will continue to work towards improving hand skills to complete work and home tasks, will make recommendations if possible for alternative ways to complete tasks safely and effectively.  Recommended she discuss her concerns with her doctor regarding return to work tasks.    Pt will benefit from skilled therapeutic intervention in order to improve on the following deficits (Retired) Impaired UE functional use;Decreased range of motion;Pain;Impaired flexibility;Decreased coordination;Decreased activity tolerance;Decreased strength   Rehab Potential Good   OT Frequency 2x / week   OT Duration 8 weeks   OT Treatment/Interventions Self-care/ADL training;Neuromuscular education;Therapeutic exercise;Therapeutic exercises;Therapeutic activities   Consulted and Agree with Plan of Care Patient        Problem List Patient Active Problem List   Diagnosis Date Noted  . Chronic bronchitis ([place]) 10/21/2015  . Bronchogenic lung cancer ([place]) 08/18/2015  . Cancer of lower lobe of right lung ([place]) 05/07/2015  . Bronchitis, chronic ([place]) 04/28/2015  . Detrusor muscle hypertonia 04/28/2015  . Reactive depression (situational) 04/28/2015  . Carpal tunnel syndrome 07/13/2014  . Cervical radiculitis 07/13/2014  . Cervical spinal stenosis 07/13/2014  . Carpal tunnel syndrome of left wrist 07/13/2014   Kysha Muralles T Tomasita Morrow, OTR/L, CLT  Shamya Macfadden 12/21/2015, 1:42 PM  [place] MAIN [facility] SERVICES [address] [place], [place], [address] Phone: [phone]   Fax:  [phone]  Name: Claudia Chavez MRN: [id number] Date of Birth: Apr 20, 1956

## 2015-12-21 NOTE — Therapy (Signed)
[place] MAIN [facility] SERVICES [address] [place], [place], [address] Phone: [phone]   Fax:  [phone]  Physical Therapy Treatment  Patient Details  Name: Claudia Chavez MRN: [id number] Date of Birth: [birth year] Referring Provider: Forest Gleason  Encounter Date: 12/21/2015      PT End of Session - 12/21/15 0958    Visit Number 4   Number of Visits 17   Date for PT Re-Evaluation 01-16-2016   Authorization Type g codes   PT Start Time 7510   PT Stop Time 0905   PT Time Calculation (min) 30 min   Activity Tolerance Patient limited by pain      Past Medical History  Diagnosis Date  . COPD (chronic obstructive pulmonary disease) ([place])   . Pneumonia 04/2015  . Non-small cell lung cancer ([place])   . Cancer of lower lobe of right lung ([place]) 05/07/2015    Past Surgical History  Procedure Laterality Date  . Abdominal hysterectomy    . Portacath placement Right 05/10/2015    Procedure: INSERTION PORT-A-CATH;  Surgeon: Nestor Lewandowsky, MD;  Location: [facility] ORS;  Service: General;  Laterality: Right;  . Elbow surgery Right 1995  . Video assisted thoracoscopy (vats)/thorocotomy Right 08/18/2015    Procedure: VIDEO ASSISTED THORACOSCOPY (VATS)/THOROCOTOMY;  Surgeon: Nestor Lewandowsky, MD;  Location: [facility] ORS;  Service: General;  Laterality: Right;    There were no vitals filed for this visit.  Visit Diagnosis:  Lack of coordination  Weakness generalized  Difficulty walking      Subjective Assessment - 12/21/15 0957    Subjective Patient has neuropathy in her hands and feet. She says that they are painful and feel like she is walking on a ball.    Currently in Pain? Yes   Pain Score 2      [place] to B feet x 25 minutes 1.3 cm squarred with no reports of pain and no reports of change in pain.  Patient is to be DC ed after treatment                            PT Education - 12/21/15 0958    Education provided Yes    Education Details DC from therapy    Person(s) Educated Patient   Methods Explanation   Comprehension Verbalized understanding             PT Long Term Goals - 12/09/15 1332    PT LONG TERM GOAL #1   Title Patient will improve her sensation in the bottom of her feet bilaterally and be able to tolerate longer distances of ambulation.    Time 4   Period Weeks   Status New   PT LONG TERM GOAL #2   Title Patient will be independent wiht HEP to improve strength and circulation to feeg   Time 4   Period Weeks   Status New               Plan - 12/21/15 0959    Clinical Impression Statement Patient continues to have pain and decresed sensation in BLE feet and will be dc due to lack of progress with using modalities to decrease her pain level.    Pt will benefit from skilled therapeutic intervention in order to improve on the following deficits Decreased activity tolerance;Decreased strength;Impaired sensation;Difficulty walking   Rehab Potential Good   PT Frequency 2x / week   PT Duration 8 weeks  PT Treatment/Interventions Electrical Stimulation;Ultrasound;Moist Heat;Therapeutic exercise;Dry needling        Problem List Patient Active Problem List   Diagnosis Date Noted  . Chronic bronchitis ([place]) 10/21/2015  . Bronchogenic lung cancer ([place]) 08/18/2015  . Cancer of lower lobe of right lung ([place]) 05/07/2015  . Bronchitis, chronic ([place]) 04/28/2015  . Detrusor muscle hypertonia 04/28/2015  . Reactive depression (situational) 04/28/2015  . Carpal tunnel syndrome 07/13/2014  . Cervical radiculitis 07/13/2014  . Cervical spinal stenosis 07/13/2014  . Carpal tunnel syndrome of left wrist 07/13/2014   Alanson Puls, PT, DPT [place] 12/21/2015, 10:17 AM  [place] MAIN [facility] SERVICES [address] [place], [place], [address] Phone: [phone]   Fax:  [phone]  Name: Claudia Chavez MRN:  [id number] Date of Birth: [birth year]

## 2015-12-23 ENCOUNTER — Ambulatory Visit: Payer: Managed Care, Other (non HMO) | Admitting: Physical Therapy

## 2015-12-28 ENCOUNTER — Ambulatory Visit: Payer: Managed Care, Other (non HMO) | Admitting: Physical Therapy

## 2015-12-30 ENCOUNTER — Other Ambulatory Visit: Payer: Managed Care, Other (non HMO)

## 2015-12-30 ENCOUNTER — Inpatient Hospital Stay: Payer: Managed Care, Other (non HMO) | Admitting: Family Medicine

## 2015-12-31 ENCOUNTER — Ambulatory Visit: Payer: Managed Care, Other (non HMO) | Admitting: Family Medicine

## 2015-12-31 ENCOUNTER — Ambulatory Visit: Payer: Managed Care, Other (non HMO) | Admitting: Occupational Therapy

## 2015-12-31 ENCOUNTER — Inpatient Hospital Stay: Payer: Managed Care, Other (non HMO)

## 2015-12-31 ENCOUNTER — Inpatient Hospital Stay (HOSPITAL_BASED_OUTPATIENT_CLINIC_OR_DEPARTMENT_OTHER): Payer: Managed Care, Other (non HMO) | Admitting: Family Medicine

## 2015-12-31 VITALS — BP 168/88 | HR 87 | Temp 96.9°F | Resp 18 | Wt 114.9 lb

## 2015-12-31 DIAGNOSIS — Z923 Personal history of irradiation: Secondary | ICD-10-CM

## 2015-12-31 DIAGNOSIS — F17210 Nicotine dependence, cigarettes, uncomplicated: Secondary | ICD-10-CM | POA: Diagnosis not present

## 2015-12-31 DIAGNOSIS — Z902 Acquired absence of lung [part of]: Secondary | ICD-10-CM | POA: Diagnosis not present

## 2015-12-31 DIAGNOSIS — Z9221 Personal history of antineoplastic chemotherapy: Secondary | ICD-10-CM

## 2015-12-31 DIAGNOSIS — C3431 Malignant neoplasm of lower lobe, right bronchus or lung: Secondary | ICD-10-CM | POA: Diagnosis not present

## 2015-12-31 DIAGNOSIS — Z452 Encounter for adjustment and management of vascular access device: Secondary | ICD-10-CM | POA: Diagnosis not present

## 2015-12-31 LAB — CBC WITH DIFFERENTIAL/PLATELET
BASOS ABS: 0.1 10*3/uL (ref 0–0.1)
Basophils Relative: 1 %
Eosinophils Absolute: 0.1 10*3/uL (ref 0–0.7)
Eosinophils Relative: 2 %
HEMATOCRIT: 36.7 % (ref 35.0–47.0)
Hemoglobin: 12.5 g/dL (ref 12.0–16.0)
LYMPHS ABS: 1.3 10*3/uL (ref 1.0–3.6)
LYMPHS PCT: 21 %
MCH: 34 pg (ref 26.0–34.0)
MCHC: 34.2 g/dL (ref 32.0–36.0)
MCV: 99.5 fL (ref 80.0–100.0)
MONO ABS: 0.5 10*3/uL (ref 0.2–0.9)
Monocytes Relative: 9 %
NEUTROS ABS: 4.2 10*3/uL (ref 1.4–6.5)
Neutrophils Relative %: 67 %
Platelets: 306 10*3/uL (ref 150–440)
RBC: 3.69 MIL/uL — ABNORMAL LOW (ref 3.80–5.20)
RDW: 16.9 % — AB (ref 11.5–14.5)
WBC: 6.1 10*3/uL (ref 3.6–11.0)

## 2015-12-31 LAB — COMPREHENSIVE METABOLIC PANEL
ALT: 15 U/L (ref 14–54)
ANION GAP: 6 (ref 5–15)
AST: 24 U/L (ref 15–41)
Albumin: 4.1 g/dL (ref 3.5–5.0)
Alkaline Phosphatase: 129 U/L — ABNORMAL HIGH (ref 38–126)
BILIRUBIN TOTAL: 0.3 mg/dL (ref 0.3–1.2)
BUN: 13 mg/dL (ref 6–20)
CALCIUM: 9.1 mg/dL (ref 8.9–10.3)
CO2: 27 mmol/L (ref 22–32)
Chloride: 104 mmol/L (ref 101–111)
Creatinine, Ser: 0.59 mg/dL (ref 0.44–1.00)
GLUCOSE: 100 mg/dL — AB (ref 65–99)
POTASSIUM: 4.5 mmol/L (ref 3.5–5.1)
Sodium: 137 mmol/L (ref 135–145)
TOTAL PROTEIN: 8.1 g/dL (ref 6.5–8.1)

## 2015-12-31 LAB — MAGNESIUM: MAGNESIUM: 2.1 mg/dL (ref 1.7–2.4)

## 2015-12-31 NOTE — Progress Notes (Signed)
[place]  [phone]  [phone]     Claudia Chavez DOB: [birth year]  MR#: [id number]  EXB#:[id number]  Patient Care Team: Maryland Pink, MD as PCP - General (Family Medicine)  CHIEF COMPLAINT:  Chief Complaint  Patient presents with  . Lung Cancer    INTERVAL HISTORY:  Patient is here for further evaluation and treatment consideration regarding carcinoma of the lung. Patient is status post right lower lobe resection, completion of chemotherapy as well as completion of radiation therapy in February 2017. Patient's chemotherapy was discontinued due to progressive neuropathy. Patient remains out of work still due to numbness and tingling in fingers and feet. She has been going to outpatient rehabilitation to try to improve symptoms. Otherwise patient reports feeling very well and denies any other complaints.  REVIEW OF SYSTEMS:   Review of Systems  Constitutional: Negative for fever, chills, weight loss, malaise/fatigue and diaphoresis.  HENT: Negative.   Eyes: Negative.   Respiratory: Negative for cough, hemoptysis, sputum production, shortness of breath and wheezing.   Cardiovascular: Negative for chest pain, palpitations, orthopnea, claudication, leg swelling and PND.  Gastrointestinal: Negative for heartburn, nausea, vomiting, abdominal pain, diarrhea, constipation, blood in stool and melena.  Genitourinary: Negative.   Musculoskeletal: Negative.   Skin: Negative.   Neurological: Positive for tingling and sensory change. Negative for dizziness, focal weakness, seizures and weakness.       Hands and feet  Endo/Heme/Allergies: Does not bruise/bleed easily.  Psychiatric/Behavioral: Negative for depression. The patient is not nervous/anxious and does not have insomnia.     As per HPI. Otherwise, a complete review of systems is negatve.  ONCOLOGY HISTORY: Oncology History   1. Squamous cell carcinoma of right LOWER LOBE OF lung stage IIIA  based on PET scan Biopsy of the (August of 2016). 2. After 3 cycles of chemotherapy patient underwent resection of lung mass (November, 2016) ypT2B ypN0 status post right lower lobectomy with a 3. She was started on carboplatinum and Taxol on a weekly basis and radiation therapy from January of 2017 4. After 2 cycles of carboplatinum patient had neuropathy grade 2 interfering with work so chemotherapy was put on hold and radiation was continued (January 19th, 2017) 5. Chemotherapy was discontinued because of progressive neuropathy. Patient is finishing of radiation therapy on November 19, 2015     Cancer of lower lobe of right lung ([place])   05/07/2015 Initial Diagnosis Cancer of lower lobe of right lung [facility])    PAST MEDICAL HISTORY: Past Medical History  Diagnosis Date  . COPD (chronic obstructive pulmonary disease) ([place])   . Pneumonia 04/2015  . Non-small cell lung cancer ([place])   . Cancer of lower lobe of right lung ([place]) 05/07/2015    PAST SURGICAL HISTORY: Past Surgical History  Procedure Laterality Date  . Abdominal hysterectomy    . Portacath placement Right 05/10/2015    Procedure: INSERTION PORT-A-CATH;  Surgeon: Nestor Lewandowsky, MD;  Location: [facility] ORS;  Service: General;  Laterality: Right;  . Elbow surgery Right 1995  . Video assisted thoracoscopy (vats)/thorocotomy Right 08/18/2015    Procedure: VIDEO ASSISTED THORACOSCOPY (VATS)/THOROCOTOMY;  Surgeon: Nestor Lewandowsky, MD;  Location: [facility] ORS;  Service: General;  Laterality: Right;    FAMILY HISTORY Family History  Problem Relation Age of Onset  . Lung cancer Father 70    GYNECOLOGIC HISTORY:  No LMP recorded. Patient has had a hysterectomy.     ADVANCED DIRECTIVES:    HEALTH MAINTENANCE: Social History  Substance Use  Topics  . Smoking status: Current Every Day Smoker -- 1.00 packs/day for 40 years    Types: Cigarettes  . Smokeless tobacco: Not on file     Comment: "1 pk or less per day"  . Alcohol Use: 1.2 -  2.4 oz/week    0 Standard drinks or equivalent, 2-4 Cans of beer per week     Comment: beer-occasionally     Colonoscopy:  PAP:  Bone density:  Mammogram:  No Known Allergies  Current Outpatient Prescriptions  Medication Sig Dispense Refill  . Ergocalciferol (VITAMIN D2 PO) Take 1 capsule by mouth every morning.    . gabapentin (NEURONTIN) 300 MG capsule Take 1 capsule (300 mg total) by mouth 3 (three) times daily. 270 capsule 3  . lansoprazole (PREVACID) 30 MG capsule Take 1 capsule (30 mg total) by mouth daily at 12 noon. 90 capsule 3  . mometasone-formoterol (DULERA) 200-5 MCG/ACT AERO Inhale 2 puffs into the lungs 2 (two) times daily. 1 Inhaler 3  . oxybutynin (DITROPAN-XL) 10 MG 24 hr tablet Take 1 tablet by mouth once daily     No current facility-administered medications for this visit.   Facility-Administered Medications Ordered in Other Visits  Medication Dose Route Frequency Provider Last Rate Last Dose  . heparin lock flush 100 unit/mL  500 Units Intracatheter Once PRN Forest Gleason, MD        OBJECTIVE: BP 168/88 mmHg  Pulse 87  Temp(Src) 96.9 F (36.1 C) (Tympanic)  Resp 18  Wt 114 lb 13.8 oz (52.1 kg)   Body mass index is 21.71 kg/(m^2).    ECOG FS:1 - Symptomatic but completely ambulatory  General: Well-developed, well-nourished, no acute distress. Eyes: Pink conjunctiva, anicteric sclera. HEENT: Normocephalic, moist mucous membranes, clear oropharnyx. Lungs: Clear to auscultation bilaterally. Heart: Regular rate and rhythm. No rubs, murmurs, or gallops. Abdomen: Soft, nontender, nondistended. No organomegaly noted, normoactive bowel sounds. Musculoskeletal: No edema or clubbing. Patient does have color change in fingers.  Neuro: Alert, answering all questions appropriately. Cranial nerves grossly intact. Grade 3 neuropathy in the upper extremities, grade 2 in bilateral lower extremities. Skin: No rashes or petechiae noted. Psych: Normal affect. Lymphatics:  No cervical, clavicular LAD.   LAB RESULTS:  Appointment on 12/31/2015  Component Date Value Ref Range Status  . WBC 12/31/2015 6.1  3.6 - 11.0 K/uL Final  . RBC 12/31/2015 3.69* 3.80 - 5.20 MIL/uL Final  . Hemoglobin 12/31/2015 12.5  12.0 - 16.0 g/dL Final  . HCT 12/31/2015 36.7  35.0 - 47.0 % Final  . MCV 12/31/2015 99.5  80.0 - 100.0 fL Final  . MCH 12/31/2015 34.0  26.0 - 34.0 pg Final  . MCHC 12/31/2015 34.2  32.0 - 36.0 g/dL Final  . RDW 12/31/2015 16.9* 11.5 - 14.5 % Final  . Platelets 12/31/2015 306  150 - 440 K/uL Final  . Neutrophils Relative % 12/31/2015 67   Final  . Neutro Abs 12/31/2015 4.2  1.4 - 6.5 K/uL Final  . Lymphocytes Relative 12/31/2015 21   Final  . Lymphs Abs 12/31/2015 1.3  1.0 - 3.6 K/uL Final  . Monocytes Relative 12/31/2015 9   Final  . Monocytes Absolute 12/31/2015 0.5  0.2 - 0.9 K/uL Final  . Eosinophils Relative 12/31/2015 2   Final  . Eosinophils Absolute 12/31/2015 0.1  0 - 0.7 K/uL Final  . Basophils Relative 12/31/2015 1   Final  . Basophils Absolute 12/31/2015 0.1  0 - 0.1 K/uL Final  . Sodium 12/31/2015 137  135 - 145 mmol/L Final  . Potassium 12/31/2015 4.5  3.5 - 5.1 mmol/L Final  . Chloride 12/31/2015 104  101 - 111 mmol/L Final  . CO2 12/31/2015 27  22 - 32 mmol/L Final  . Glucose, Bld 12/31/2015 100* 65 - 99 mg/dL Final  . BUN 12/31/2015 13  6 - 20 mg/dL Final  . Creatinine, Ser 12/31/2015 0.59  0.44 - 1.00 mg/dL Final  . Calcium 12/31/2015 9.1  8.9 - 10.3 mg/dL Final  . Total Protein 12/31/2015 8.1  6.5 - 8.1 g/dL Final  . Albumin 12/31/2015 4.1  3.5 - 5.0 g/dL Final  . AST 12/31/2015 24  15 - 41 U/L Final  . ALT 12/31/2015 15  14 - 54 U/L Final  . Alkaline Phosphatase 12/31/2015 129* 38 - 126 U/L Final  . Total Bilirubin 12/31/2015 0.3  0.3 - 1.2 mg/dL Final  . GFR calc non Af Amer 12/31/2015 >60  >60 mL/min Final  . GFR calc Af Amer 12/31/2015 >60  >60 mL/min Final   Comment: (NOTE) The eGFR has been calculated using the CKD  EPI equation. This calculation has not been validated in all clinical situations. eGFR's persistently <60 mL/min signify possible Chronic Kidney Disease.   . Anion gap 12/31/2015 6  5 - 15 Final  . Magnesium 12/31/2015 2.1  1.7 - 2.4 mg/dL Final    STUDIES: No results found.  ASSESSMENT:  Non-small cell carcinoma of right lower lobe of lung, squamous cell, stage IIIa, T3 N2 M0.  PLAN:   1. Carcinoma of right lower lobe of lung. Patient is status post neoadjuvant chemotherapy followed by right lower lobe resection. Patient then completed chemotherapy which had to be discontinued due to progressive neuropathy as well as completion of radiation therapy in February 2017. Clinically there is no evidence of recurrent or progressive disease. 2. Progressive neuropathy. Patient with grade 3 neuropathy in bilateral hands but worse in her right upper extremity. 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 Advised patient to continue with her FMLA for at least 8 more weeks. Patient is working on an outpatient basis with occupational therapy and physical therapy. We'll continue with routine follow-up in approximately 6 weeks. We will obtain restaging CT scan in approximately 5 weeks.  Patient expressed understanding and was in agreement with this plan. She also understands that She can call clinic at any time with any questions, concerns, or complaints.   Dr. Rogue Bussing was available for consultation and review of plan of care for this patient.  Cancer of lower lobe of right lung ([place])   Staging form: Lung, AJCC 7th Edition     Clinical: Stage IIIA (T3, N2, M0) - Signed by Forest Gleason, MD on 05/07/2015   Evlyn Kanner, NP   12/31/2015 4:20 PM

## 2016-01-03 ENCOUNTER — Ambulatory Visit: Payer: Managed Care, Other (non HMO) | Admitting: Physical Therapy

## 2016-01-03 ENCOUNTER — Ambulatory Visit: Payer: Managed Care, Other (non HMO) | Attending: Oncology | Admitting: Occupational Therapy

## 2016-01-03 DIAGNOSIS — R531 Weakness: Secondary | ICD-10-CM | POA: Diagnosis not present

## 2016-01-03 DIAGNOSIS — R278 Other lack of coordination: Secondary | ICD-10-CM | POA: Diagnosis not present

## 2016-01-03 DIAGNOSIS — R279 Unspecified lack of coordination: Secondary | ICD-10-CM | POA: Diagnosis not present

## 2016-01-03 DIAGNOSIS — M6281 Muscle weakness (generalized): Secondary | ICD-10-CM | POA: Diagnosis present

## 2016-01-04 NOTE — Therapy (Addendum)
[place] MAIN [facility] SERVICES [address] [place], [place], [address] Phone: [phone]   Fax:  [phone]  Occupational Therapy Treatment  Patient Details  Name: Claudia Chavez MRN: [id number] Date of Birth: 24-Sep-1956 Referring Provider: Dr. Jeb Levering  Encounter Date: 01/03/2016      OT End of Session - 01/09/16 1939    Visit Number 4   Number of Visits 16   Date for OT Re-Evaluation 02/05/16   Activity Tolerance Patient tolerated treatment well   Behavior During Therapy [facility] for tasks assessed/performed      Past Medical History  Diagnosis Date  . COPD (chronic obstructive pulmonary disease) ([place])   . Pneumonia 04/2015  . Non-small cell lung cancer ([place])   . Cancer of lower lobe of right lung ([place]) 05/07/2015    Past Surgical History  Procedure Laterality Date  . Abdominal hysterectomy    . Portacath placement Right 05/10/2015    Procedure: INSERTION PORT-A-CATH;  Surgeon: Nestor Lewandowsky, MD;  Location: [facility] ORS;  Service: General;  Laterality: Right;  . Elbow surgery Right 1995  . Video assisted thoracoscopy (vats)/thorocotomy Right 08/18/2015    Procedure: VIDEO ASSISTED THORACOSCOPY (VATS)/THOROCOTOMY;  Surgeon: Nestor Lewandowsky, MD;  Location: [facility] ORS;  Service: General;  Laterality: Right;    There were no vitals filed for this visit.  Visit Diagnosis:  Other lack of coordination  Muscle weakness (generalized)      Subjective Assessment - 01/09/16 1937    Subjective  Patient reports she is doing well, had her date extended in regards to going back to work due to her numbness in her hands and feet.     Pertinent History Pt. is a 60 y.o. female with a history of Lung Cancer, who presents to the outpatient clinic for bilateral hand numbness from chemotherapy treaments. Pt. also presents with right hand weakness, and incoordination.  Pt. has finished chemotherapy, and now is having radiation treatments. Pt. 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    Patient Stated Goals Patient wants to be independent with all daily tasks including being able to return to her job at [employer].                      OT Treatments/Exercises (OP) - 01/09/16 1938    Fine Motor Coordination   Other Fine Motor Exercises Patient seen this date for manipulation of nuts and bolts from elevated plane of motion with cues, variety of sizes performed from large to small with right hand leading task.  Patient participating in manipulation of [place] discs from tabletop, flipping to opposite side with use of isolated finger movements with cues, slow to complete and some difficulty noted with technique.     Sensation Exercises   Sensory Retraining Sensory activities matching fabric with vision occluded. Pt. Required increased time to match corduroy fabric, terry cloth, and satin/nylon.                OT Education - 01/09/16 1938    Education provided Yes          OT Short Term Goals - 12/09/15 1714    OT SHORT TERM GOAL #1   Title Pt. will improve right hand coordination skills  by 3 sec. of speed to be able to dial phone numbers.    Baseline Pt. has difficulty   Time 8  Period Weeks   Status New   OT SHORT TERM GOAL #2   Title Pt. will improve right grip strength by 5# to be able to hold and pour from a coffee pot.   Baseline Pt. has difficulty   Time 8   Period Weeks   Status New   OT SHORT TERM GOAL #3   Title Pt. will improve right lateral pinch strength by 2# to be able to cut meat.   Baseline Pt. has diificulty   Time 8   Period Weeks   Status New   OT SHORT TERM GOAL #4   Title Pt. will improve 3pt. pinch strength by 2# to be able to handle and count money/change.   Baseline Pt. has difficulty   Time 8   Period Weeks   Status New   OT SHORT TERM GOAL #5   Title Pt. will write 3 lines legibly while having a  secure grasp on the pen 100% of the time.   Baseline Pt. has difficulty   Time 8   Period Weeks   Status New   Additional Short Term Goals   Additional Short Term Goals Yes   OT SHORT TERM GOAL #6   Title Pt. will demonstrate compensatory techniques for sensory impairments in bilateral hands 100% of the time during ADL and IADL tasks.    Baseline Does not use   Time 8   Period Weeks   Status New           OT Long Term Goals - 12/09/15 1734    OT LONG TERM GOAL #1   Title To reach her maximal potential with bilateral UE and hand use during ADL/IADLs.   Baseline Pt. has difficulty   Time 8   Period Weeks   Status New               Plan - 01/09/16 1925    Clinical Impression Statement Patient continues to report numbness in both hands which affects her ability to manipulate items, speed, dexterity and precision of any work with her hands which is essential in returning to work tasks.  Will continue to work towards improving BUE use to complete necessary daily tasks with greater independence.   Rehab Potential Good   OT Frequency 2x / week   OT Duration 8 weeks   OT Treatment/Interventions Self-care/ADL training;Neuromuscular education;Therapeutic exercise;Therapeutic exercises;Therapeutic activities   Consulted and Agree with Plan of Care Patient        Problem List Patient Active Problem List   Diagnosis Date Noted  . Chronic bronchitis ([place]) 10/21/2015  . Bronchogenic lung cancer ([place]) 08/18/2015  . Cancer of lower lobe of right lung ([place]) 05/07/2015  . Bronchitis, chronic ([place]) 04/28/2015  . Detrusor muscle hypertonia 04/28/2015  . Reactive depression (situational) 04/28/2015  . Carpal tunnel syndrome 07/13/2014  . Cervical radiculitis 07/13/2014  . Cervical spinal stenosis 07/13/2014  . Carpal tunnel syndrome of left wrist 07/13/2014   Ahlaya Ende Oneita Jolly, OTR/L, CLT  Sahan Pen 01/09/2016, 7:40 PM  [place] MAIN [facility]  SERVICES [address] [place], [place], [address] Phone: [phone]   Fax:  [phone]  Name: Claudia Chavez MRN: [id number] Date of Birth: Nov 25, 1955

## 2016-01-05 ENCOUNTER — Ambulatory Visit: Payer: Managed Care, Other (non HMO) | Admitting: Occupational Therapy

## 2016-01-05 ENCOUNTER — Ambulatory Visit: Payer: Managed Care, Other (non HMO) | Admitting: Physical Therapy

## 2016-01-05 DIAGNOSIS — R278 Other lack of coordination: Secondary | ICD-10-CM | POA: Diagnosis not present

## 2016-01-05 DIAGNOSIS — M6281 Muscle weakness (generalized): Secondary | ICD-10-CM

## 2016-01-07 NOTE — Therapy (Signed)
[place] MAIN [facility] SERVICES [address] [place], [place], [address] Phone: [phone]   Fax:  [phone]  Occupational Therapy Treatment  Patient Details  Name: Claudia Chavez MRN: [id number] Date of Birth: 06/30/1956 Referring Provider: Dr. Jeb Levering  Encounter Date: 01/05/2016      OT End of Session - 01/07/16 1644    Visit Number 5   Number of Visits 16   Date for OT Re-Evaluation 02/05/16   Activity Tolerance Patient tolerated treatment well   Behavior During Therapy [facility] for tasks assessed/performed      Past Medical History  Diagnosis Date  . COPD (chronic obstructive pulmonary disease) ([place])   . Pneumonia 04/2015  . Non-small cell lung cancer ([place])   . Cancer of lower lobe of right lung ([place]) 05/07/2015    Past Surgical History  Procedure Laterality Date  . Abdominal hysterectomy    . Portacath placement Right 05/10/2015    Procedure: INSERTION PORT-A-CATH;  Surgeon: Nestor Lewandowsky, MD;  Location: [facility] ORS;  Service: General;  Laterality: Right;  . Elbow surgery Right 1995  . Video assisted thoracoscopy (vats)/thorocotomy Right 08/18/2015    Procedure: VIDEO ASSISTED THORACOSCOPY (VATS)/THOROCOTOMY;  Surgeon: Nestor Lewandowsky, MD;  Location: [facility] ORS;  Service: General;  Laterality: Right;    There were no vitals filed for this visit.      Subjective Assessment - 01/07/16 1635    Subjective  Patient reports she is doing well today, was a little bit sore after last session with using dowel for exercises.    Pertinent History Pt. is a 60 y.o. female with a history of Lung Cancer, who presents to the outpatient clinic for bilateral hand numbness from chemotherapy treaments. Pt. also presents with right hand weakness, and incoordination.  Pt. has finished chemotherapy, and now is having radiation treatments. Pt. 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    Patient Stated Goals Patient wants to be independent with all daily tasks including being able to return to her job at [employer].                      OT Treatments/Exercises (OP) - 01/07/16 1635    Fine Motor Coordination   Other Fine Motor Exercises Patient seen this date for manipulation of small objects 1/2 inch in size with cues for technique, isolated finger movements, using the hand for storage and translatory movements of the hand to improve manipulation skills when handling daily objects.  Patient responds well to cues for right hand.   Neurological Re-education Exercises   Other Exercises 1 Patient seen for RUE strengthening with red theraband for shoulder flexion, AB, diagonal patterns, elbow flexion extension for 10 reps for 1-2 sets each.  Grip strengthening for sustained grip on the right with 11#.  Pinch strengthening with resistive pins placing onto elevated surface, verbal cues and occasional guiding as needed to complete exercises as outlined.   Sensation Exercises   Sensory Retraining Sensory activities matching fabric with vision occluded. Pt. Required increased time to match corduroy fabric, terry cloth, and satin/nylon.                OT Education - 01/07/16 1644    Education provided Yes          OT Short Term Goals - 12/09/15 1714    OT SHORT TERM GOAL #1  Title Pt. will improve right hand coordination skills  by 3 sec. of speed to be able to dial phone numbers.    Baseline Pt. has difficulty   Time 8   Period Weeks   Status New   OT SHORT TERM GOAL #2   Title Pt. will improve right grip strength by 5# to be able to hold and pour from a coffee pot.   Baseline Pt. has difficulty   Time 8   Period Weeks   Status New   OT SHORT TERM GOAL #3   Title Pt. will improve right lateral pinch strength by 2# to be able to cut meat.   Baseline Pt. has diificulty   Time 8   Period Weeks   Status New   OT SHORT TERM GOAL #4   Title  Pt. will improve 3pt. pinch strength by 2# to be able to handle and count money/change.   Baseline Pt. has difficulty   Time 8   Period Weeks   Status New   OT SHORT TERM GOAL #5   Title Pt. will write 3 lines legibly while having a secure grasp on the pen 100% of the time.   Baseline Pt. has difficulty   Time 8   Period Weeks   Status New   Additional Short Term Goals   Additional Short Term Goals Yes   OT SHORT TERM GOAL #6   Title Pt. will demonstrate compensatory techniques for sensory impairments in bilateral hands 100% of the time during ADL and IADL tasks.    Baseline Does not use   Time 8   Period Weeks   Status New           OT Long Term Goals - 12/09/15 1734    OT LONG TERM GOAL #1   Title To reach her maximal potential with bilateral UE and hand use during ADL/IADLs.   Baseline Pt. has difficulty   Time 8   Period Weeks   Status New               Plan - 01/07/16 1644    Clinical Impression Statement Patient continues to work towards strength and coordination tasks for the right hand especially (weaker hand) but BUE as well, she has some limitations with sensation which affects her ability to perform precision tasks which is a requirement of her work.  Will continue to focus on these skills to obtain further independence in daily tasks for work and home. She continues to benefit from skilled OT services to [facility] her safety and independence in necessary daily tasks.    Rehab Potential Good   OT Frequency 2x / week   OT Duration 8 weeks   OT Treatment/Interventions Self-care/ADL training;Neuromuscular education;Therapeutic exercise;Therapeutic exercises;Therapeutic activities   Consulted and Agree with Plan of Care Patient      Patient will benefit from skilled therapeutic intervention in order to improve the following deficits and impairments:  Impaired UE functional use, Decreased range of motion, Pain, Impaired flexibility, Decreased coordination,  Decreased activity tolerance, Decreased strength  Visit Diagnosis: Muscle weakness (generalized)  Other lack of coordination    Problem List Patient Active Problem List   Diagnosis Date Noted  . Chronic bronchitis ([place]) 10/21/2015  . Bronchogenic lung cancer ([place]) 08/18/2015  . Cancer of lower lobe of right lung ([place]) 05/07/2015  . Bronchitis, chronic ([place]) 04/28/2015  . Detrusor muscle hypertonia 04/28/2015  . Reactive depression (situational) 04/28/2015  . Carpal tunnel syndrome 07/13/2014  . Cervical  radiculitis 07/13/2014  . Cervical spinal stenosis 07/13/2014  . Carpal tunnel syndrome of left wrist 07/13/2014   [name] Oneita Jolly, OTR/L, CLT Lovett,[name] 01/07/2016, 4:47 PM  [place] MAIN [facility] SERVICES [address] [place], [place], [address] Phone: [phone]   Fax:  [phone]  Name: Claudia Chavez MRN: [id number] Date of Birth: [birth year]

## 2016-01-10 ENCOUNTER — Ambulatory Visit: Payer: Managed Care, Other (non HMO) | Admitting: Occupational Therapy

## 2016-01-10 ENCOUNTER — Ambulatory Visit: Payer: Managed Care, Other (non HMO) | Admitting: Physical Therapy

## 2016-01-10 DIAGNOSIS — R278 Other lack of coordination: Secondary | ICD-10-CM

## 2016-01-10 DIAGNOSIS — M6281 Muscle weakness (generalized): Secondary | ICD-10-CM

## 2016-01-10 NOTE — Therapy (Signed)
[place] MAIN [facility] SERVICES [address] [place], [place], [address] Phone: [phone]   Fax:  [phone]  Occupational Therapy Treatment  Patient Details  Name: Claudia Chavez MRN: [id number] Date of Birth: [birth year] Referring Provider: Dr. Jeb Levering  Encounter Date: 01/10/2016      OT End of Session - 01/10/16 0932    Visit Number 6   Number of Visits 16   Date for OT Re-Evaluation 02/05/16   OT Start Time 0915   OT Stop Time 1014   OT Time Calculation (min) 59 min   Activity Tolerance Patient tolerated treatment well   Behavior During Therapy [facility] for tasks assessed/performed      Past Medical History  Diagnosis Date  . COPD (chronic obstructive pulmonary disease) ([place])   . Pneumonia 04/2015  . Non-small cell lung cancer ([place])   . Cancer of lower lobe of right lung ([place]) 05/07/2015    Past Surgical History  Procedure Laterality Date  . Abdominal hysterectomy    . Portacath placement Right 05/10/2015    Procedure: INSERTION PORT-A-CATH;  Surgeon: Nestor Lewandowsky, MD;  Location: [facility] ORS;  Service: General;  Laterality: Right;  . Elbow surgery Right 1995  . Video assisted thoracoscopy (vats)/thorocotomy Right 08/18/2015    Procedure: VIDEO ASSISTED THORACOSCOPY (VATS)/THOROCOTOMY;  Surgeon: Nestor Lewandowsky, MD;  Location: [facility] ORS;  Service: General;  Laterality: Right;    There were no vitals filed for this visit.      Subjective Assessment - 01/10/16 0931    Subjective  Patient reports she had a good weekend, was able to clean some on friday and saturday.  Still feels numbness in hands and feet but no pain.     Pertinent History Pt. is a 60 y.o. female with a history of Lung Cancer, who presents to the outpatient clinic for bilateral hand numbness from chemotherapy treaments. Pt. also presents with right hand weakness, and incoordination.  Pt. has finished chemotherapy, and now is having radiation treatments. Pt. 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    Patient Stated Goals Patient wants to be independent with all daily tasks including being able to return to her job at [employer].   Currently in Pain? No/denies   Pain Score 0-No pain                      OT Treatments/Exercises (OP) - 01/10/16 5809    Fine Motor Coordination   Other Fine Motor Exercises Patient seen for fine motor coordination task of picking up 1/2 inch size beads from table, translatory skills moving towards palm, using the hand for storage, moving items back out to tips to place into bucket.  Cues for patterns and hand movements.   Neurological Re-education Exercises   Other Exercises 1 Patient seen for strengthening of bilateral UEs, dowel exercises with 1.5# for 12 reps each exercise for 2 sets, shoulder flexion, ABD/ADD, chest press, forwards and backwards circles with cues and rest breaks as needed.  Reciprocal arm exercise with resistance of 2.3 to 2.5, forwards/backwards for 5 minutes total with cues and therapist in constant attendance to ensure grip and adjust settings.  Grip strength with right UE with 11.2# for 25 reps for 2 sets, cues for sustained grip.     Sensation Exercises   Sensory Retraining Sensory activities matching fabric with vision occluded. Pt. Required increased  time to match corduroy fabric, terry cloth, and satin/nylon.                OT Education - 01/10/16 0932    Education provided Yes   Education Details HEP, strengthening, coordination   Person(s) Educated Patient   Methods Explanation;Demonstration;Verbal cues   Comprehension Verbal cues required;Returned demonstration;Verbalized understanding          OT Short Term Goals - 01/10/16 1428    OT SHORT TERM GOAL #1   Title Pt. will improve right hand coordination skills  by 3 sec. of speed to be able to dial phone numbers.    Baseline Pt. has  difficulty   Time 8   Period Weeks   Status On-going   OT SHORT TERM GOAL #2   Title Pt. will improve right grip strength by 5# to be able to hold and pour from a coffee pot.   Baseline Pt. has difficulty   Time 8   Period Weeks   Status On-going   OT SHORT TERM GOAL #3   Title Pt. will improve right lateral pinch strength by 2# to be able to cut meat.   Baseline Pt. has diificulty   Time 8   Period Weeks   Status On-going   OT SHORT TERM GOAL #4   Title Pt. will improve 3pt. pinch strength by 2# to be able to handle and count money/change.   Baseline Pt. has difficulty   Time 8   Period Weeks   Status On-going   OT SHORT TERM GOAL #5   Title Pt. will write 3 lines legibly while having a secure grasp on the pen 100% of the time.   Baseline Pt. has difficulty   Time 8   Period Weeks   Status On-going   OT SHORT TERM GOAL #6   Title Pt. will demonstrate compensatory techniques for sensory impairments in bilateral hands 100% of the time during ADL and IADL tasks.    Baseline Does not use   Time 8   Period Weeks   Status On-going           OT Long Term Goals - 01/10/16 1429    OT LONG TERM GOAL #1   Title To reach her maximal potential with bilateral UE and hand use during ADL/IADLs.   Baseline Pt. has difficulty   Time 8   Period Weeks   Status On-going               Plan - 01/10/16 1426    Clinical Impression Statement Patient demonstrates improved strength with increased number of reps of exercises, increased sets, decreased rest breaks.  Numbness remains a barrier for hand function, however, patient progressing well with fine motor coordination tasks if vision is used to compensate. She continues to benefit from skilled OT services to increase right hand strength, coordination and use for work and home tasks.     Rehab Potential Good   OT Frequency 2x / week   OT Duration 8 weeks   OT Treatment/Interventions Self-care/ADL training;Neuromuscular  education;Therapeutic exercise;Therapeutic exercises;Therapeutic activities   Consulted and Agree with Plan of Care Patient      Patient will benefit from skilled therapeutic intervention in order to improve the following deficits and impairments:  Impaired UE functional use, Decreased range of motion, Pain, Impaired flexibility, Decreased coordination, Decreased activity tolerance, Decreased strength  Visit Diagnosis: Muscle weakness (generalized)  Other lack of coordination    Problem List Patient Active Problem List  Diagnosis Date Noted  . Chronic bronchitis ([place]) 10/21/2015  . Bronchogenic lung cancer ([place]) 08/18/2015  . Cancer of lower lobe of right lung ([place]) 05/07/2015  . Bronchitis, chronic ([place]) 04/28/2015  . Detrusor muscle hypertonia 04/28/2015  . Reactive depression (situational) 04/28/2015  . Carpal tunnel syndrome 07/13/2014  . Cervical radiculitis 07/13/2014  . Cervical spinal stenosis 07/13/2014  . Carpal tunnel syndrome of left wrist 07/13/2014   [name] T Tomasita Morrow, OTR/L, CLT  Lovett,[name] 01/10/2016, 2:30 PM  [place] MAIN [facility] SERVICES [address] [place], [place], [address] Phone: [phone]   Fax:  [phone]  Name: ABAGAEL KRAMM MRN: [id number] Date of Birth: 19-Jan-1956

## 2016-01-13 ENCOUNTER — Encounter: Payer: Self-pay | Admitting: Occupational Therapy

## 2016-01-13 ENCOUNTER — Ambulatory Visit: Payer: Managed Care, Other (non HMO) | Admitting: Occupational Therapy

## 2016-01-13 ENCOUNTER — Ambulatory Visit: Payer: Managed Care, Other (non HMO) | Admitting: Physical Therapy

## 2016-01-13 DIAGNOSIS — R278 Other lack of coordination: Secondary | ICD-10-CM | POA: Diagnosis not present

## 2016-01-13 DIAGNOSIS — M6281 Muscle weakness (generalized): Secondary | ICD-10-CM

## 2016-01-14 NOTE — Therapy (Signed)
[place] MAIN [facility] SERVICES [address] [place], [place], [address] Phone: [phone]   Fax:  [phone]  Occupational Therapy Treatment  Patient Details  Name: SHERETA CROTHERS MRN: [id number] Date of Birth: 08-Mar-1956 Referring Provider: Dr. Jeb Levering  Encounter Date: 01/13/2016      OT End of Session - 01/14/16 1158    Visit Number 7   Number of Visits 32   Date for OT Re-Evaluation 03/09/16   OT Start Time 0932   OT Stop Time 1015   OT Time Calculation (min) 43 min   Activity Tolerance Patient tolerated treatment well   Behavior During Therapy [facility] for tasks assessed/performed      Past Medical History  Diagnosis Date  . COPD (chronic obstructive pulmonary disease) ([place])   . Pneumonia 04/2015  . Non-small cell lung cancer ([place])   . Cancer of lower lobe of right lung ([place]) 05/07/2015    Past Surgical History  Procedure Laterality Date  . Abdominal hysterectomy    . Portacath placement Right 05/10/2015    Procedure: INSERTION PORT-A-CATH;  Surgeon: Nestor Lewandowsky, MD;  Location: [facility] ORS;  Service: General;  Laterality: Right;  . Elbow surgery Right 1995  . Video assisted thoracoscopy (vats)/thorocotomy Right 08/18/2015    Procedure: VIDEO ASSISTED THORACOSCOPY (VATS)/THOROCOTOMY;  Surgeon: Nestor Lewandowsky, MD;  Location: [facility] ORS;  Service: General;  Laterality: Right;    There were no vitals filed for this visit.      Subjective Assessment - 01/13/16 0944    Subjective  Patient reports her arm was a little tired last time from UE bike.     Pertinent History Pt. is a 60 y.o. female with a history of Lung Cancer, who presents to the outpatient clinic for bilateral hand numbness from chemotherapy treaments. Pt. also presents with right hand weakness, and incoordination.  Pt. has finished chemotherapy, and now is having radiation treatments. Pt. 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    Patient Stated Goals Patient wants to be independent with all daily tasks including being able to return to her job at [employer].   Currently in Pain? No/denies   Pain Score 0-No pain   Multiple Pain Sites No                      OT Treatments/Exercises (OP) - 01/14/16 1155    Fine Motor Coordination   Other Fine Motor Exercises Patient seen this date for manipulation of grooved pegs with cues for prehension patterns and placement into board.     Neurological Re-education Exercises   Other Exercises 1 UBE for 5 minutes forwards, backwards, alternating resistance of 2.0 to 2.4 with therapist in constant attendance to ensure grip and adjust settings, grip strength 11# for 2 reps for right hand.     Other Exercises 2 Reassessment of goals and status, strength    Sensation Exercises   Sensory Retraining Sensory activities matching fabric with vision occluded. Pt. Required increased time to match corduroy fabric, terry cloth, and satin/nylon.                OT Education - 01/14/16 1158    Education provided Yes   Education Details HEP   Person(s) Educated Patient   Methods Explanation;Demonstration;Verbal cues   Comprehension Verbal cues required;Returned demonstration;Verbalized understanding          OT  Short Term Goals - 01/14/16 1200    OT SHORT TERM GOAL #1   Title Pt. will improve right hand coordination skills  by 3 sec. of speed to be able to dial phone numbers.    Baseline Pt. has difficulty   Time 8   Period Weeks   Status On-going   OT SHORT TERM GOAL #2   Title Pt. will improve right grip strength by 5# to be able to hold and pour from a coffee pot.   Baseline Pt. has difficulty   Time 8   Period Weeks   Status On-going   OT SHORT TERM GOAL #3   Title Pt. will improve right lateral pinch strength by 2# to be able to cut meat.   Baseline Pt. has diificulty   Time 8   Period Weeks   Status  On-going   OT SHORT TERM GOAL #4   Title Pt. will improve 3pt. pinch strength by 2# to be able to handle and count money/change.   Baseline Pt. has difficulty   Time 8   Period Weeks   Status On-going   OT SHORT TERM GOAL #5   Title Pt. will write 3 lines legibly while having a secure grasp on the pen 100% of the time.   Baseline Pt. has difficulty   Time 8   Period Weeks   Status On-going   OT SHORT TERM GOAL #6   Title Pt. will demonstrate compensatory techniques for sensory impairments in bilateral hands 100% of the time during ADL and IADL tasks.    Baseline Does not use   Time 8   Period Weeks   Status On-going           OT Long Term Goals - 01/14/16 1200    OT LONG TERM GOAL #1   Title To reach her maximal potential with bilateral UE and hand use during ADL/IADLs.   Baseline Pt. has difficulty   Time 8   Period Weeks   Status On-going               Plan - 01/14/16 1159    Clinical Impression Statement Patient has continued to make progress towards her goals, she still has some numbness in her hands which limit her ability to complete daily tasks.  Her strength is improving, left 4/5 overall, R UE 4-/5 overall, grip in right hand still significantly less than left.  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  She would continue to benefit from skilled OT to [facility] her safety and independence in necessary daily tasks including work tasks.     Rehab Potential Good   OT Frequency 2x / week   OT Duration 8 weeks   OT Treatment/Interventions Self-care/ADL training;Neuromuscular education;Therapeutic exercise;Therapeutic exercises;Therapeutic activities   Consulted and Agree with Plan of Care Patient      Patient will benefit from skilled therapeutic  intervention in order to improve the following deficits and impairments:  Impaired UE functional use, Decreased range of motion, Pain, Impaired flexibility, Decreased coordination, Decreased activity tolerance, Decreased strength  Visit Diagnosis: Muscle weakness (generalized) - Plan: Ot plan of care cert/re-cert  Other lack of coordination - Plan: Ot plan of care cert/re-cert    Problem List Patient Active Problem List   Diagnosis Date Noted  .  Chronic bronchitis ([place]) 10/21/2015  . Bronchogenic lung cancer ([place]) 08/18/2015  . Cancer of lower lobe of right lung ([place]) 05/07/2015  . Bronchitis, chronic ([place]) 04/28/2015  . Detrusor muscle hypertonia 04/28/2015  . Reactive depression (situational) 04/28/2015  . Carpal tunnel syndrome 07/13/2014  . Cervical radiculitis 07/13/2014  . Cervical spinal stenosis 07/13/2014  . Carpal tunnel syndrome of left wrist 07/13/2014   [name] T Tomasita Morrow, OTR/L, CLT  Lovett,[name] 01/14/2016, 12:22 PM  [place] MAIN [facility] SERVICES [address] [place], [place], [address] Phone: [phone]   Fax:  [phone]  Name: SANAIA JASSO MRN: [id number] Date of Birth: 20-Dec-1955

## 2016-01-17 ENCOUNTER — Ambulatory Visit: Payer: Managed Care, Other (non HMO) | Admitting: Physical Therapy

## 2016-01-17 ENCOUNTER — Ambulatory Visit: Payer: Managed Care, Other (non HMO) | Admitting: Occupational Therapy

## 2016-01-17 DIAGNOSIS — R278 Other lack of coordination: Secondary | ICD-10-CM | POA: Diagnosis not present

## 2016-01-17 DIAGNOSIS — M6281 Muscle weakness (generalized): Secondary | ICD-10-CM

## 2016-01-17 NOTE — Therapy (Signed)
[place] MAIN [facility] SERVICES [address] [place], [place], [address] Phone: [phone]   Fax:  [phone]  Occupational Therapy Treatment/Discharge Summary Pt seen from 12/09/2015 to 01/17/2016   Patient Details  Name: Claudia Chavez MRN: [id number] Date of Birth: 02-May-1956 Referring Provider: Dr. Jeb Levering  Encounter Date: 01/17/2016      OT End of Session - 01/17/16 1612    Visit Number 8   Number of Visits 32   Date for OT Re-Evaluation 03/09/16   OT Start Time 0926   OT Stop Time 1016   OT Time Calculation (min) 50 min   Activity Tolerance Patient tolerated treatment well   Behavior During Therapy [facility] for tasks assessed/performed      Past Medical History  Diagnosis Date  . COPD (chronic obstructive pulmonary disease) ([place])   . Pneumonia 04/2015  . Non-small cell lung cancer ([place])   . Cancer of lower lobe of right lung ([place]) 05/07/2015    Past Surgical History  Procedure Laterality Date  . Abdominal hysterectomy    . Portacath placement Right 05/10/2015    Procedure: INSERTION PORT-A-CATH;  Surgeon: Nestor Lewandowsky, MD;  Location: [facility] ORS;  Service: General;  Laterality: Right;  . Elbow surgery Right 1995  . Video assisted thoracoscopy (vats)/thorocotomy Right 08/18/2015    Procedure: VIDEO ASSISTED THORACOSCOPY (VATS)/THOROCOTOMY;  Surgeon: Nestor Lewandowsky, MD;  Location: [facility] ORS;  Service: General;  Laterality: Right;    There were no vitals filed for this visit.      Subjective Assessment - 01/17/16 1604    Subjective  Patient reports she talked with her husband last week and she feels she has enough exercises and tools to use at home to continue with rehab herself.  Requesting to be discharged from OT this date.  She will still need to work towards improving strength and coordination on her own at home prior to returning to work.     Pertinent History Pt. is a 60 y.o. female with a history of Lung Cancer, who presents to  the outpatient clinic for bilateral hand numbness from chemotherapy treaments. Pt. also presents with right hand weakness, and incoordination.  Pt. has finished chemotherapy, and now is having radiation treatments. Pt. 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    Patient Stated Goals Patient wants to be independent with all daily tasks including being able to return to her job at [employer].   Currently in Pain? No/denies   Pain Score 0-No pain   Multiple Pain Sites No                      OT Treatments/Exercises (OP) - 01/17/16 1606    ADLs   ADL Comments Reassessment of ADL/IADL tasks this date with goals updated to reflect progress.    Fine Motor Coordination   Other Fine Motor Exercises Patient seen this date for manipulation of small objects from [employer] pegboard set with small dowels, washers and collars with righ hand.  Cues for translatory skills of the hand and using the hand for storage.  She was instructed on extensive home program for fine motor coordination tasks and issued a written handout.  She was able to demonstrate manipulation of small household items such as coins this date from the table to place into a resistive bank, paperclips, beads, nuts/bolts, washers, pen, etc.   Neurological Re-education  Exercises   Other Exercises 1 Issued and instructed on red theraband exercises for UE strengthening tasks, resistive putty exercises, dowel exercises with patient demonstration.  Instructed on advancement of exercises in the future.                  OT Education - 01/17/16 1611    Education provided Yes   Education Details extensive home program for strength with theraband, dowel and resistive putty.  Coordination exercises for gross and fine motor tasks.     Person(s) Educated Patient   Methods Explanation;Demonstration;Verbal cues;Handout   Comprehension Verbalized  understanding;Returned demonstration          OT Short Term Goals - 01/17/16 1613    OT SHORT TERM GOAL #1   Title Pt. will improve right hand coordination skills  by 3 sec. of speed to be able to dial phone numbers.    Time 8   Period Weeks   Status Achieved   OT SHORT TERM GOAL #2   Title Pt. will improve right grip strength by 5# to be able to hold and pour from a coffee pot.   Time 8   Period Weeks   Status Achieved   OT SHORT TERM GOAL #3   Title Pt. will improve right lateral pinch strength by 2# to be able to cut meat.   Time 8   Period Weeks   Status Achieved   OT SHORT TERM GOAL #4   Title Pt. will improve 3pt. pinch strength by 2# to be able to handle and count money/change.   Time 8   Period Weeks   Status Achieved   OT SHORT TERM GOAL #5   Title Pt. will write 3 lines legibly while having a secure grasp on the pen 100% of the time.   Time 8   Period Weeks   Status Achieved   OT SHORT TERM GOAL #6   Title Pt. will demonstrate compensatory techniques for sensory impairments in bilateral hands 100% of the time during ADL and IADL tasks.    Baseline Does not use   Time 8   Period Weeks   Status Partially Met           OT Long Term Goals - 01/17/16 1614    OT LONG TERM GOAL #1   Title To reach her maximal potential with bilateral UE and hand use during ADL/IADLs.   Time 8   Period Weeks   Status Partially Met               Plan - 01/17/16 1613    Clinical Impression Statement Patient has made good progress in all areas with hre right hand.  She is currently independent with her home program and feels she can continue with exercises at home.  She still has some issues with sensation in the right hand with numbness and tingling which may improve with time.  Recommend she continue to work with extensive home exercise program for a few more weeks prior to attempting to return to work.  Will plan to discharge patient at this time with goals met/partially  met.   Rehab Potential Good   OT Frequency 2x / week   OT Duration 8 weeks   OT Treatment/Interventions Self-care/ADL training;Neuromuscular education;Therapeutic exercise;Therapeutic exercises;Therapeutic activities   Consulted and Agree with Plan of Care Patient      Patient will benefit from skilled therapeutic intervention in order to improve the following deficits and impairments:  Impaired UE functional  use, Decreased range of motion, Pain, Impaired flexibility, Decreased coordination, Decreased activity tolerance, Decreased strength  Visit Diagnosis: Muscle weakness (generalized)  Other lack of coordination    Problem List Patient Active Problem List   Diagnosis Date Noted  . Chronic bronchitis ([place]) 10/21/2015  . Bronchogenic lung cancer ([place]) 08/18/2015  . Cancer of lower lobe of right lung ([place]) 05/07/2015  . Bronchitis, chronic ([place]) 04/28/2015  . Detrusor muscle hypertonia 04/28/2015  . Reactive depression (situational) 04/28/2015  . Carpal tunnel syndrome 07/13/2014  . Cervical radiculitis 07/13/2014  . Cervical spinal stenosis 07/13/2014  . Carpal tunnel syndrome of left wrist 07/13/2014   Casyn Becvar T Tomasita Morrow, OTR/L, CLT Tamalyn Wadsworth 01/17/2016, 4:18 PM  [place] MAIN [facility] SERVICES [address] [place], [place], [address] Phone: [phone]   Fax:  [phone]  Name: Claudia Chavez MRN: [id number] Date of Birth: November 26, 1955

## 2016-01-19 ENCOUNTER — Encounter: Payer: Managed Care, Other (non HMO) | Admitting: Occupational Therapy

## 2016-01-19 ENCOUNTER — Ambulatory Visit: Payer: Managed Care, Other (non HMO) | Admitting: Physical Therapy

## 2016-01-25 ENCOUNTER — Encounter: Payer: Managed Care, Other (non HMO) | Admitting: Occupational Therapy

## 2016-01-27 ENCOUNTER — Encounter: Payer: Managed Care, Other (non HMO) | Admitting: Occupational Therapy

## 2016-02-01 ENCOUNTER — Encounter: Payer: Managed Care, Other (non HMO) | Admitting: Occupational Therapy

## 2016-02-03 ENCOUNTER — Ambulatory Visit
Admission: RE | Admit: 2016-02-03 | Discharge: 2016-02-03 | Disposition: A | Discharge status: Home / Self Care | Payer: Managed Care, Other (non HMO) | Source: Ambulatory Visit | Attending: Family Medicine | Admitting: Family Medicine

## 2016-02-03 DIAGNOSIS — R918 Other nonspecific abnormal finding of lung field: Secondary | ICD-10-CM | POA: Diagnosis not present

## 2016-02-03 DIAGNOSIS — C3431 Malignant neoplasm of lower lobe, right bronchus or lung: Secondary | ICD-10-CM | POA: Insufficient documentation

## 2016-02-03 MED ORDER — IOPAMIDOL (ISOVUE-300) INJECTION 61%
75.0000 mL | Freq: Once | INTRAVENOUS | Status: AC | PRN
  Administered 2016-02-03: 75 mL via INTRAVENOUS

## 2016-02-04 ENCOUNTER — Ambulatory Visit: Payer: Managed Care, Other (non HMO)

## 2016-02-04 ENCOUNTER — Encounter: Payer: Managed Care, Other (non HMO) | Admitting: Occupational Therapy

## 2016-02-07 ENCOUNTER — Encounter: Payer: Managed Care, Other (non HMO) | Admitting: Occupational Therapy

## 2016-02-09 ENCOUNTER — Encounter: Payer: Managed Care, Other (non HMO) | Admitting: Occupational Therapy

## 2016-02-10 ENCOUNTER — Encounter: Payer: Self-pay | Admitting: Oncology

## 2016-02-10 ENCOUNTER — Inpatient Hospital Stay (HOSPITAL_BASED_OUTPATIENT_CLINIC_OR_DEPARTMENT_OTHER): Payer: Managed Care, Other (non HMO) | Admitting: Oncology

## 2016-02-10 ENCOUNTER — Inpatient Hospital Stay: Payer: Managed Care, Other (non HMO) | Attending: Oncology

## 2016-02-10 VITALS — BP 165/92 | HR 88 | Temp 97.0°F | Resp 88 | Wt 113.5 lb

## 2016-02-10 DIAGNOSIS — Z923 Personal history of irradiation: Secondary | ICD-10-CM | POA: Insufficient documentation

## 2016-02-10 DIAGNOSIS — Z79899 Other long term (current) drug therapy: Secondary | ICD-10-CM

## 2016-02-10 DIAGNOSIS — F17210 Nicotine dependence, cigarettes, uncomplicated: Secondary | ICD-10-CM

## 2016-02-10 DIAGNOSIS — G629 Polyneuropathy, unspecified: Secondary | ICD-10-CM

## 2016-02-10 DIAGNOSIS — C3431 Malignant neoplasm of lower lobe, right bronchus or lung: Secondary | ICD-10-CM

## 2016-02-10 DIAGNOSIS — Z9221 Personal history of antineoplastic chemotherapy: Secondary | ICD-10-CM | POA: Insufficient documentation

## 2016-02-10 DIAGNOSIS — R531 Weakness: Secondary | ICD-10-CM | POA: Diagnosis not present

## 2016-02-10 DIAGNOSIS — R5383 Other fatigue: Secondary | ICD-10-CM | POA: Insufficient documentation

## 2016-02-10 LAB — CBC WITH DIFFERENTIAL/PLATELET
BASOS ABS: 0.1 10*3/uL (ref 0–0.1)
BASOS PCT: 1 %
Eosinophils Absolute: 0.1 10*3/uL (ref 0–0.7)
Eosinophils Relative: 2 %
HCT: 36.5 % (ref 35.0–47.0)
Hemoglobin: 12.8 g/dL (ref 12.0–16.0)
LYMPHS ABS: 1.8 10*3/uL (ref 1.0–3.6)
Lymphocytes Relative: 30 %
MCH: 35 pg — ABNORMAL HIGH (ref 26.0–34.0)
MCHC: 35 g/dL (ref 32.0–36.0)
MCV: 100 fL (ref 80.0–100.0)
Monocytes Absolute: 0.6 10*3/uL (ref 0.2–0.9)
Monocytes Relative: 10 %
NEUTROS ABS: 3.5 10*3/uL (ref 1.4–6.5)
NEUTROS PCT: 57 %
PLATELETS: 307 10*3/uL (ref 150–440)
RBC: 3.65 MIL/uL — ABNORMAL LOW (ref 3.80–5.20)
RDW: 15.9 % — AB (ref 11.5–14.5)
WBC: 6.1 10*3/uL (ref 3.6–11.0)

## 2016-02-10 LAB — COMPREHENSIVE METABOLIC PANEL
ALBUMIN: 4.3 g/dL (ref 3.5–5.0)
ALT: 17 U/L (ref 14–54)
AST: 23 U/L (ref 15–41)
Alkaline Phosphatase: 137 U/L — ABNORMAL HIGH (ref 38–126)
Anion gap: 7 (ref 5–15)
BUN: 12 mg/dL (ref 6–20)
CHLORIDE: 104 mmol/L (ref 101–111)
CO2: 29 mmol/L (ref 22–32)
Calcium: 9.6 mg/dL (ref 8.9–10.3)
Creatinine, Ser: 0.66 mg/dL (ref 0.44–1.00)
GFR calc Af Amer: >60 mL/min (ref >60)
GFR calc non Af Amer: >60 mL/min (ref >60)
GLUCOSE: 95 mg/dL (ref 65–99)
POTASSIUM: 3.7 mmol/L (ref 3.5–5.1)
Sodium: 140 mmol/L (ref 135–145)
Total Bilirubin: 0.4 mg/dL (ref 0.3–1.2)
Total Protein: 7.8 g/dL (ref 6.5–8.1)

## 2016-02-10 NOTE — Progress Notes (Signed)
Patient c/o neuropathy in hands and feet.  Also has some exertional SOB.

## 2016-02-13 ENCOUNTER — Encounter: Payer: Self-pay | Admitting: Oncology

## 2016-02-13 NOTE — Progress Notes (Signed)
[place] @ [facility] Telephone:([phone]  Fax:([phone]) [place]: [birth year]  MR#: [id number]  OVF#:[id number]  Patient Care Team: [place] Pink, MD as PCP - General (Family Medicine)  CHIEF COMPLAINT:  1.  Squamous cell carcinoma of right  LOWER LOBE OF  lung stage IIIA based on PET scan Biopsy of the (August of 2016). 2.  After 3 cycles of chemotherapy patient underwent resection of lung mass (November, 2016) ypT2B   ypN0  status post right lower lobectomy with a 3. She  was started on carboplatinum and Taxol on a weekly basis and radiation therapy from January of 2017 4. After 2 cycles of carboplatinum patient had neuropathy grade 2 interfering with work so chemotherapy was put on hold and radiation was continued (January 19th, 2017) 5.  Chemotherapy was discontinued because of progressive neuropathy.  Patient is finishing of radiation therapy on November 19, 2015 VISIT DIAGNOSIS:     ICD-9-CM ICD-10-CM   1. Cancer of lower lobe of right lung (HCC) 162.5 C34.31 CBC with Differential     Comprehensive metabolic panel     CT Chest W Contrast    CANCER  of lung Oncology History   1. Squamous cell carcinoma of right LOWER LOBE OF lung stage IIIA based on PET scan Biopsy of the (August of 2016). 2. After 3 cycles of chemotherapy patient underwent resection of lung mass (November, 2016) ypT2B ypN0 status post right lower lobectomy with a 3. She was started on carboplatinum and Taxol on a weekly basis and radiation therapy from January of 2017 4. After 2 cycles of carboplatinum patient had neuropathy grade 2 interfering with work so chemotherapy was put on hold and radiation was continued (January 19th, 2017) 5. Chemotherapy was discontinued because of progressive neuropathy. Patient is finishing of radiation therapy on November 19, 2015     Cancer of lower lobe of right lung [facility])   05/07/2015 Initial Diagnosis Cancer of lower lobe of  right lung [facility])  INTERVAL HISTORY 60 year old lady came today further follow-up after right lower lobe resection.  Patient and margins were clear.  Tumor size was still 6.3 cm.  There was some chemotherapy effect.  Margins were close but clear.  Multiple station off lymph nodes were evaluated and were negative for any metastatic disease Case was discussed in tumor conference Patient is here for further evaluation and treatment consideration  Patient is here for further follow-up is finishing up radiation therapy.  No cough no shortness of breath.  Patient continues to a problem with tingling and numbness in upper extremity more so in the right hand than the left.  Patient drops things and has difficulty buttoning unbuttoning short period In also feels numb in lower extremity which sometimes interferes with ambulation Appetite has been stable and has not lost significant weight. Patient c/o neuropathy in hands and feet. Also has some exertional SOB. Patient is here for further follow-up and treatment consideration.   : REVIEW OF SYSTEMS:    general status: Patient is feeling weak and tired.  No change in a performance status.  No chills.  No fever. HEENT: .  No evidence of stomatitis Lungs: No cough or shortness of breath . patient is recovering from surgery has soreness in the chest wall area Cardiac: No chest pain or paroxysmal nocturnal dyspnea GI: No nausea no vomiting no diarrhea no abdominal pain Skin: No rash Lower extremity no swelling Neurological system: The numbness in upper and lower extremity  and weakness in right upper extremity as described about Musculoskeletal system no bony pains  Review of systems:  All other systems reviewed and found to be negative.  As per HPI. Otherwise, a complete review of systems is negatve.  PAST MEDICAL HISTORY: No significant past medical history.  Denies any diabetes or hypertensio occasional joint pains recently had some swelling of the  left ankle area ultrasound was negative for deep vein thrombosis PAST SURGICAL HISTORY: Patient had a hysterectomy.  No other significant past history FAMILY HISTORY There is no significant family history of breast cancer, ovarian cancer, colon cancer GYNECOLOGIC HISTORY:  No LMP recorded. Patient has had a hysterectomy.     ADVANCED DIRECTIVES:  Patient does have advance healthcare directive, Patient   does not desire to make any changes  HEALTH MAINTENANCE: Social History  Substance Use Topics  . Smoking status: Current Every Day Smoker -- 1.00 packs/day for 40 years    Types: Cigarettes  . Smokeless tobacco: None     Comment: "1 pk or less per day"  . Alcohol Use: 1.2 - 2.4 oz/week    0 Standard drinks or equivalent, 2-4 Cans of beer per week     Comment: beer-occasionally      No Known Allergies  Current Outpatient Prescriptions  Medication Sig Dispense Refill  . Ergocalciferol (VITAMIN D2 PO) Take 1 capsule by mouth every morning.    . gabapentin (NEURONTIN) 300 MG capsule Take 1 capsule (300 mg total) by mouth 3 (three) times daily. 270 capsule 3  . lansoprazole (PREVACID) 30 MG capsule Take 1 capsule (30 mg total) by mouth daily at 12 noon. 90 capsule 3  . mometasone-formoterol (DULERA) 200-5 MCG/ACT AERO Inhale 2 puffs into the lungs 2 (two) times daily. 1 Inhaler 3  . oxybutynin (DITROPAN-XL) 10 MG 24 hr tablet Take 1 tablet by mouth once daily     No current facility-administered medications for this visit.   Facility-Administered Medications Ordered in Other Visits  Medication Dose Route Frequency Provider Last Rate Last Dose  . heparin lock flush 100 unit/mL  500 Units Intracatheter Once PRN Forest Gleason, MD        OBJECTIVE: PHYSICAL EXAM: GENERAL:  Well developed, well nourished, sitting comfortably in the exam room in no acute distress. MENTAL STATUS:  Alert and oriented to person, place and time.  ENT:  Oropharynx clear without lesion.  Tongue normal.  Mucous membranes moist.  RESPIRATORY:  Clear to auscultation without rales, wheezes or rhonchi. CARDIOVASCULAR:  Regular rate and rhythm without murmur, rub or gallop. BREAST:  Right breast without masses, skin changes or nipple discharge.  Left breast without masses, skin changes or nipple discharge. ABDOMEN:  Soft, non-tender, with active bowel sounds, and no hepatosplenomegaly.  No masses. BACK:  No CVA tenderness.  No tenderness on percussion of the back or rib cage. SKIN:  No rashes, ulcers or lesions. EXTREMITIES: No edema, no skin discoloration or tenderness.  No palpable cords. LYMPH NODES: No palpable cervical, supraclavicular, axillary or inguinal adenopathy  NEUROLOGICAL: Grade 2 neuropathy.  Grade 3 in the right upper extremity. Functional disability with holding and dropping things PSYCH:  Appropriate.                     IMPRESSION: 1. Interval right lower lobe resection without evidence of local recurrence or definite metastatic disease. 2. There are tiny subpleural nodules in the left upper lobe which were not well seen previously, possibly due to technical differences.  Attention on follow-up recommended. 3. Stable small mediastinal lymph nodes.   Electronically Signed  By: Richardean Sale M.D.  On: 02/03/2016 10:33   ASSESSMENT:  Non-small cell carcinoma of lung, squamous cell.  Right lower lobe T3 N2 M0 tumor stage IIIa Status post right lower lobectomy.  T2b N0 M0 tumor postoperatively margins were clear.  patient has started radiation therapy.  Was getting weekly carboplatinum and Taxol PLAN:   1.  Cancer  of long status post neoadjuvant chemotherapy followed by resection.  Radiation and chemotherapy.  Chemotherapy has been discontinued because of progressive neuropathy Patient is finishing up radiation therapy on November 19, 2015 2.  Progressive neuropathy particularly in right upper extremity with functional disability of holding things and  dropping things May interfere with patients as an nature of works Patient did not have any significant improvement after going through physiotherapy  Consider possibility of Lyrica or gabapentin CT scan has been reviewed independently there is no evidence of recurrent disease CT scan as also has been reviewed with patient  Gabapentin was started.  If there is no improvement and he recovered be considered. In my absence patient will be followed in November with a repeat CT scan by my associate Surveillance ( as per NCCN guidelines)  chest CT scan with and without contrast every 6-12 months postoperatively for 2 years. In low dose noncontrast enhanced chest CT scan is recommended annually thereafter.  Patient treated with radiation chemotherapy who has residual abnormality may require more frequent PET scan and CT scan.  MRI of brain is not recommended without symptoms PET scan may be helpful for assessing CT scan that appeared to show malignant neoplasm but may be radiation fibrosis or atelectasis.         Forest Gleason, MD   02/13/2016 9:18 AM      [place] @ [facility] Telephone:(336) [phone]  Fax:(336) [phone]

## 2016-02-16 ENCOUNTER — Encounter: Payer: Managed Care, Other (non HMO) | Admitting: Occupational Therapy

## 2016-02-18 ENCOUNTER — Encounter: Payer: Managed Care, Other (non HMO) | Admitting: Occupational Therapy

## 2016-02-21 ENCOUNTER — Encounter: Payer: Managed Care, Other (non HMO) | Admitting: Occupational Therapy

## 2016-02-24 ENCOUNTER — Encounter: Payer: Managed Care, Other (non HMO) | Admitting: Occupational Therapy

## 2016-02-29 ENCOUNTER — Encounter: Payer: Managed Care, Other (non HMO) | Admitting: Occupational Therapy

## 2016-03-03 ENCOUNTER — Encounter: Payer: Managed Care, Other (non HMO) | Admitting: Occupational Therapy

## 2016-03-15 ENCOUNTER — Encounter: Payer: Self-pay | Admitting: Radiation Oncology

## 2016-03-15 ENCOUNTER — Ambulatory Visit
Admission: RE | Admit: 2016-03-15 | Discharge: 2016-03-15 | Disposition: A | Discharge status: Home / Self Care | Payer: BLUE CROSS/BLUE SHIELD | Source: Ambulatory Visit | Attending: Radiation Oncology | Admitting: Radiation Oncology

## 2016-03-15 VITALS — BP 154/79 | HR 70 | Temp 97.8°F | Wt 115.7 lb

## 2016-03-15 DIAGNOSIS — Z902 Acquired absence of lung [part of]: Secondary | ICD-10-CM | POA: Diagnosis not present

## 2016-03-15 DIAGNOSIS — Z85118 Personal history of other malignant neoplasm of bronchus and lung: Secondary | ICD-10-CM | POA: Insufficient documentation

## 2016-03-15 DIAGNOSIS — Z923 Personal history of irradiation: Secondary | ICD-10-CM | POA: Insufficient documentation

## 2016-03-15 DIAGNOSIS — C3431 Malignant neoplasm of lower lobe, right bronchus or lung: Secondary | ICD-10-CM

## 2016-03-15 DIAGNOSIS — Z9221 Personal history of antineoplastic chemotherapy: Secondary | ICD-10-CM | POA: Insufficient documentation

## 2016-03-15 NOTE — Progress Notes (Signed)
Radiation Oncology Follow up Note  Name: Claudia Chavez   Date:   03/15/2016 MRN:  [id number] DOB: 1955/11/06    This 60 y.o. female presents to the clinic today for follow-up for lung cancer now 4 months out completing adjuvant radiation therapy REFERRING PROVIDER: Maryland Pink, MD  HPI: Patient is a 60 year old female now 4 months out having completed adjuvant radiation therapy to her right chest status post neoadjuvant chemotherapy for a T3 N2 M0 squamous cell carcinoma the right lung status post neoadjuvant chemotherapy than right lower lobectomy with surgical pathology showing close margins at the staple line. She is seen today in routine follow-up and is doing well. She specifically denies cough hemoptysis chest tightness or dysphagia. Recently had a CT scan showing no evidence of disease in the chest..  COMPLICATIONS OF TREATMENT: none  FOLLOW UP COMPLIANCE: keeps appointments   PHYSICAL EXAM:  BP 154/79 mmHg  Pulse 70  Temp(Src) 97.8 F (36.6 C)  Wt 115 lb 11.9 oz (52.5 kg) Well-developed well-nourished patient in NAD. HEENT reveals PERLA, EOMI, discs not visualized.  Oral cavity is clear. No oral mucosal lesions are identified. Neck is clear without evidence of cervical or supraclavicular adenopathy. Lungs are clear to A&P. Cardiac examination is essentially unremarkable with regular rate and rhythm without murmur rub or thrill. Abdomen is benign with no organomegaly or masses noted. Motor sensory and DTR levels are equal and symmetric in the upper and lower extremities. Cranial nerves II through XII are grossly intact. Proprioception is intact. No peripheral adenopathy or edema is identified. No motor or sensory levels are noted. Crude visual fields are within normal range.  RADIOLOGY RESULTS: Recent CT scan is reviewed and compatible with the above-stated findings  PLAN: At the present time am please were overall progress. I have reviewed her CT scan showing no evidence of  disease in the chest. I am please were overall progress I've asked to see her back in 6 months for follow-up. She continues close follow-up care with medical oncology. Patient knows to call sooner with any concerns.  I would like to take this opportunity to thank you for allowing me to participate in the care of your patient.Armstead Peaks., MD

## 2016-06-12 ENCOUNTER — Other Ambulatory Visit: Payer: Self-pay | Admitting: Radiation Oncology

## 2016-06-27 ENCOUNTER — Telehealth: Payer: Self-pay | Admitting: *Deleted

## 2016-06-27 MED ORDER — PREGABALIN 75 MG PO CAPS: 75.0000 mg | ORAL_CAPSULE | Freq: Two times a day (BID) | ORAL | 2 refills | Status: DC

## 2016-06-27 NOTE — Telephone Encounter (Signed)
She is needing refill on med for neuropathy, but Dr Jeb Levering told her when she runs out of Gabapentin , she was to changed to Lyrica. She needs prescription for Lyrica sent to pharmacy. I do not know the dose, she is currently taking gabapentin 300 mg tid. Please advise

## 2016-06-27 NOTE — Telephone Encounter (Signed)
lyrica rx faxes to pt's pharmacy

## 2016-06-27 NOTE — Telephone Encounter (Signed)
Per V/o Dr. Rogue Bussing-  MD will send rx for lyrica 75 mg bid.  D/c gabapentin  pls let patient know.

## 2016-08-07 ENCOUNTER — Ambulatory Visit
Admission: RE | Admit: 2016-08-07 | Discharge: 2016-08-07 | Disposition: A | Discharge status: Home / Self Care | Payer: BLUE CROSS/BLUE SHIELD | Source: Ambulatory Visit | Attending: Oncology | Admitting: Oncology

## 2016-08-07 DIAGNOSIS — C3431 Malignant neoplasm of lower lobe, right bronchus or lung: Secondary | ICD-10-CM | POA: Diagnosis not present

## 2016-08-07 DIAGNOSIS — Z9889 Other specified postprocedural states: Secondary | ICD-10-CM | POA: Insufficient documentation

## 2016-08-07 LAB — POCT I-STAT CREATININE: CREATININE: 0.5 mg/dL (ref 0.44–1.00)

## 2016-08-07 MED ORDER — IOPAMIDOL (ISOVUE-300) INJECTION 61%
75.0000 mL | Freq: Once | INTRAVENOUS | Status: AC | PRN
  Administered 2016-08-07: 75 mL via INTRAVENOUS

## 2016-08-09 ENCOUNTER — Inpatient Hospital Stay: Payer: BLUE CROSS/BLUE SHIELD | Attending: Internal Medicine

## 2016-08-09 ENCOUNTER — Inpatient Hospital Stay (HOSPITAL_BASED_OUTPATIENT_CLINIC_OR_DEPARTMENT_OTHER): Payer: BLUE CROSS/BLUE SHIELD | Admitting: Internal Medicine

## 2016-08-09 VITALS — BP 162/97 | HR 80 | Temp 96.0°F | Resp 18 | Wt 120.6 lb

## 2016-08-09 DIAGNOSIS — M2550 Pain in unspecified joint: Secondary | ICD-10-CM | POA: Diagnosis not present

## 2016-08-09 DIAGNOSIS — Z902 Acquired absence of lung [part of]: Secondary | ICD-10-CM | POA: Diagnosis not present

## 2016-08-09 DIAGNOSIS — F17210 Nicotine dependence, cigarettes, uncomplicated: Secondary | ICD-10-CM | POA: Diagnosis not present

## 2016-08-09 DIAGNOSIS — Z923 Personal history of irradiation: Secondary | ICD-10-CM | POA: Insufficient documentation

## 2016-08-09 DIAGNOSIS — Z79899 Other long term (current) drug therapy: Secondary | ICD-10-CM | POA: Insufficient documentation

## 2016-08-09 DIAGNOSIS — J449 Chronic obstructive pulmonary disease, unspecified: Secondary | ICD-10-CM | POA: Insufficient documentation

## 2016-08-09 DIAGNOSIS — R5383 Other fatigue: Secondary | ICD-10-CM

## 2016-08-09 DIAGNOSIS — Z9221 Personal history of antineoplastic chemotherapy: Secondary | ICD-10-CM | POA: Diagnosis not present

## 2016-08-09 DIAGNOSIS — G629 Polyneuropathy, unspecified: Secondary | ICD-10-CM

## 2016-08-09 DIAGNOSIS — Z85118 Personal history of other malignant neoplasm of bronchus and lung: Secondary | ICD-10-CM | POA: Diagnosis not present

## 2016-08-09 DIAGNOSIS — Z8701 Personal history of pneumonia (recurrent): Secondary | ICD-10-CM

## 2016-08-09 DIAGNOSIS — C3431 Malignant neoplasm of lower lobe, right bronchus or lung: Secondary | ICD-10-CM

## 2016-08-09 LAB — COMPREHENSIVE METABOLIC PANEL
ALBUMIN: 4.3 g/dL (ref 3.5–5.0)
ALK PHOS: 116 U/L (ref 38–126)
ALT: 18 U/L (ref 14–54)
AST: 25 U/L (ref 15–41)
Anion gap: 8 (ref 5–15)
BILIRUBIN TOTAL: 0.3 mg/dL (ref 0.3–1.2)
BUN: 10 mg/dL (ref 6–20)
CALCIUM: 9.2 mg/dL (ref 8.9–10.3)
CO2: 26 mmol/L (ref 22–32)
Chloride: 100 mmol/L — ABNORMAL LOW (ref 101–111)
Creatinine, Ser: 0.56 mg/dL (ref 0.44–1.00)
GFR calc Af Amer: >60 mL/min (ref >60)
GFR calc non Af Amer: >60 mL/min (ref >60)
GLUCOSE: 106 mg/dL — AB (ref 65–99)
Potassium: 3.8 mmol/L (ref 3.5–5.1)
SODIUM: 134 mmol/L — AB (ref 135–145)
TOTAL PROTEIN: 7.8 g/dL (ref 6.5–8.1)

## 2016-08-09 LAB — CBC WITH DIFFERENTIAL/PLATELET
BASOS ABS: 0.1 10*3/uL (ref 0–0.1)
BASOS PCT: 1 %
EOS ABS: 0.1 10*3/uL (ref 0–0.7)
Eosinophils Relative: 2 %
HEMATOCRIT: 37 % (ref 35.0–47.0)
HEMOGLOBIN: 12.7 g/dL (ref 12.0–16.0)
Lymphocytes Relative: 29 %
Lymphs Abs: 2 10*3/uL (ref 1.0–3.6)
MCH: 34 pg (ref 26.0–34.0)
MCHC: 34.4 g/dL (ref 32.0–36.0)
MCV: 98.9 fL (ref 80.0–100.0)
MONOS PCT: 11 %
Monocytes Absolute: 0.7 10*3/uL (ref 0.2–0.9)
NEUTROS ABS: 4 10*3/uL (ref 1.4–6.5)
NEUTROS PCT: 57 %
Platelets: 293 10*3/uL (ref 150–440)
RBC: 3.74 MIL/uL — AB (ref 3.80–5.20)
RDW: 15.4 % — ABNORMAL HIGH (ref 11.5–14.5)
WBC: 7 10*3/uL (ref 3.6–11.0)

## 2016-08-09 NOTE — Progress Notes (Signed)
Patient is here for follow up  

## 2016-08-09 NOTE — Progress Notes (Signed)
[place] OFFICE PROGRESS NOTE  Patient Care Team: Maryland Pink, MD as PCP - General (Family Medicine)  Cancer of lower lobe of right lung [facility])   Staging form: Lung, AJCC 7th Edition   - Clinical: Stage IIIA (T3, N2, M0) - Signed by Forest Gleason, MD on 05/07/2015   Oncology History   # Squamous cell carcinoma of right LOWER LOBE OF lung stage IIIA based on PET scan Biopsy of the (August of 2016). 2. After 3 cycles of chemotherapy patient underwent resection of lung mass (November, 2016) ypT2B ypN0 [Dr.Oaks] status post right lower lobectomy with a 3. She was started on carboplatinum and Taxol on a weekly basis and radiation therapy from January of 2017 4. After 2 cycles of carboplatinum patient had neuropathy grade 2 interfering with work so chemotherapy was put on hold and radiation was continued (January 19th, 2017) 5. Chemotherapy was discontinued because of progressive neuropathy. Patient is finishing of radiation therapy on November 19, 2015  # NOV 2017- CT- 49m right hilar LN; PET in 353month    [place] ([facility] [place]  05/07/2015 Initial Diagnosis    [facility]       This is my first interaction with the patient as patient's primary oncologist has been Dr.Choksi. I reviewed the patient's prior charts/pertinent labs/imaging in detail; findings are summarized above.    INTERVAL HISTORY:  DeKERIN KREN89.o.  female pleasant patient above history of Stage III squamous cell lung cancer- status post neoadjuvant chemotherapy followed by surgery; followed by adjuvant chemotherapy; radiation therapy finished in February 2017 is here for follow-up/to review the results of the CAT scan.  Continues to chronic fatigue. Chronic joint pains. Not any worse. No bone pain. Complains of tingling and numbness in her hands- however intolerant to Lyrica and Neurontin. She is not on any medications.  Appetite is  good. No headaches. No cough. No unusual shortness of breath or chest pain.  REVIEW OF SYSTEMS:  A complete 10 point review of system is done which is negative except mentioned above/history of present illness.   PAST MEDICAL HISTORY :  Past Medical History:  Diagnosis Date  . [facility] [place] of right [facility] ([facility]  . COPD (chronic obstructive pulmonary disease) ([facility] [place]  . Non-small cell lung cancer ([facility]  . Pneumonia 04/2015    PAST SURGICAL HISTORY :   Past Surgical History:  Procedure Laterality Date  . ABDOMINAL HYSTERECTOMY    . ELBOW SURGERY Right 1995  . PORTACATH PLACEMENT Right 05/10/2015   Procedure: INSERTION PORT-A-CATH;  Surgeon: TiNestor LewandowskyMD;  Location: [facility] ORS;  Service: General;  Laterality: Right;  . Marland KitchenIDEO ASSISTED THORACOSCOPY (VATS)/THOROCOTOMY Right 08/18/2015   Procedure: VIDEO ASSISTED THORACOSCOPY (VATS)/THOROCOTOMY;  Surgeon: TiNestor LewandowskyMD;  Location: [facility] ORS;  Service: General;  Laterality: Right;    FAMILY HISTORY :   Family History  Problem Relation Age of Onset  . Lung cancer Father 6227  SOCIAL HISTORY:   Social History  Substance Use Topics  . Smoking status: Current Every Day Smoker    Packs/day: 1.00    Years: 40.00    Types: Cigarettes  . Smokeless tobacco: Not on file     Comment: "1 pk or less per day"  . Alcohol use 1.2 - 2.4 oz/week    2 - 4 Cans of beer per week     Comment:  beer-occasionally    ALLERGIES:  is allergic to lyrica [pregabalin].  MEDICATIONS:  Current Outpatient Prescriptions  Medication Sig Dispense Refill  . Ergocalciferol (VITAMIN D2 PO) Take 1 capsule by mouth every morning.    . lansoprazole (PREVACID) 30 MG capsule Take 1 capsule (30 mg total) by mouth daily at 12 noon. 90 capsule 3  . mometasone-formoterol (DULERA) 200-5 MCG/ACT AERO Inhale 2 puffs into the lungs 2 (two) times daily. 1 Inhaler 3  . oxybutynin (DITROPAN-XL) 10 MG 24 hr tablet Take 1 tablet by mouth once daily    . albuterol  (PROAIR HFA) 108 (90 Base) MCG/ACT inhaler Inhale into the lungs.    . pregabalin (LYRICA) 75 MG capsule Take 1 capsule (75 mg total) by mouth 2 (two) times daily. (Patient not taking: Reported on 08/09/2016) 60 capsule 2   No current facility-administered medications for this visit.    Facility-Administered Medications Ordered in Other Visits  Medication Dose Route Frequency Provider Last Rate Last Dose  . heparin lock flush 100 unit/mL  500 Units Intracatheter Once PRN Forest Gleason, MD        PHYSICAL EXAMINATION: ECOG PERFORMANCE STATUS: 0 - Asymptomatic  BP (!) 162/97 (BP Location: Left Arm, Patient Position: Sitting)   Pulse 80   Temp (!) 96 F (35.6 C) (Tympanic)   Resp 18   Wt 120 lb 9.6 oz (54.7 kg)   BMI 22.79 kg/m   Filed Weights   08/09/16 1501  Weight: 120 lb 9.6 oz (54.7 kg)    GENERAL: Well-nourished well-developed; Alert, no distress and comfortable.   Alone.  EYES: no pallor or icterus OROPHARYNX: no thrush or ulceration; good dentition  NECK: supple, no masses felt LYMPH:  no palpable lymphadenopathy in the cervical, axillary or inguinal regions LUNGS: clear to auscultation and  No wheeze or crackles HEART/CVS: regular rate & rhythm and no murmurs; No lower extremity edema ABDOMEN:abdomen soft, non-tender and normal bowel sounds Musculoskeletal:no cyanosis of digits and no clubbing  PSYCH: alert & oriented x 3 with fluent speech NEURO: no focal motor/sensory deficits SKIN:  no rashes or significant lesions  LABORATORY DATA:  I have reviewed the data as listed    Component Value Date/Time   NA 134 (L) 08/09/2016 1425   K 3.8 08/09/2016 1425   CL 100 (L) 08/09/2016 1425   CO2 26 08/09/2016 1425   GLUCOSE 106 (H) 08/09/2016 1425   BUN 10 08/09/2016 1425   CREATININE 0.56 08/09/2016 1425   CALCIUM 9.2 08/09/2016 1425   PROT 7.8 08/09/2016 1425   ALBUMIN 4.3 08/09/2016 1425   AST 25 08/09/2016 1425   ALT 18 08/09/2016 1425   ALKPHOS 116 08/09/2016  1425   BILITOT 0.3 08/09/2016 1425   GFRNONAA >60 08/09/2016 1425   GFRAA >60 08/09/2016 1425    No results found for: SPEP, UPEP  Lab Results  Component Value Date   WBC 7.0 08/09/2016   NEUTROABS 4.0 08/09/2016   HGB 12.7 08/09/2016   HCT 37.0 08/09/2016   MCV 98.9 08/09/2016   PLT 293 08/09/2016      Chemistry      Component Value Date/Time   NA 134 (L) 08/09/2016 1425   K 3.8 08/09/2016 1425   CL 100 (L) 08/09/2016 1425   CO2 26 08/09/2016 1425   BUN 10 08/09/2016 1425   CREATININE 0.56 08/09/2016 1425      Component Value Date/Time   CALCIUM 9.2 08/09/2016 1425   ALKPHOS 116 08/09/2016 1425   AST  25 08/09/2016 1425   ALT 18 08/09/2016 1425   BILITOT 0.3 08/09/2016 1425       RADIOGRAPHIC STUDIES: I have personally reviewed the radiological images as listed and agreed with the findings in the report. No results found.   ASSESSMENT & PLAN:  Cancer of lower lobe of right lung ([place]) # STAGE IIIA squamous cell lung cancer status post neoadjuvant chemotherapy followed by surgery; adjuvant radiation finished in February 2017. Clinically no evidence of recurrence. CT scan reviewed negative-except C discussion below  # ? Right hilar prominence-11 mm lymph node- follow up PET in 3 months  # PN- G-2- intol to Neurontin/Lyrica. Reluctant to use cymbalta.  # smoke 2-3 cig/day; recommend quitting smoking.   # plan follow up in 3 months/ PETscan prior.   # I reviewed the blood work- with the patient in detail; also reviewed the imaging independently [as summarized above]; and with the patient in detail.    Orders Placed This Encounter  Procedures  . NM PET Image Restag (PS) Skull Base To Thigh    Standing Status:   Future    Standing Expiration Date:   10/09/2017    Order Specific Question:   Reason for Exam (SYMPTOM  OR DIAGNOSIS REQUIRED)    Answer:   lung cancer    Order Specific Question:   Preferred imaging location?    Answer:   [facility]   All  questions were answered. The patient knows to call the clinic with any problems, questions or concerns.      Cammie Sickle, MD 08/09/2016 3:41 PM

## 2016-08-09 NOTE — Assessment & Plan Note (Addendum)
#   STAGE IIIA squamous cell lung cancer status post neoadjuvant chemotherapy followed by surgery; adjuvant radiation finished in February 2017. Clinically no evidence of recurrence. CT scan reviewed negative-except C discussion below  # ? Right hilar prominence-11 mm lymph node- follow up PET in 3 months  # PN- G-2- intol to Neurontin/Lyrica. Reluctant to use cymbalta.  # smoke 2-3 cig/day; recommend quitting smoking.   # plan follow up in 3 months/ PETscan prior.   # I reviewed the blood work- with the patient in detail; also reviewed the imaging independently [as summarized above]; and with the patient in detail.

## 2016-08-23 ENCOUNTER — Encounter: Payer: Self-pay | Admitting: Radiation Oncology

## 2016-08-23 ENCOUNTER — Ambulatory Visit
Admission: RE | Admit: 2016-08-23 | Discharge: 2016-08-23 | Disposition: A | Discharge status: Home / Self Care | Payer: BLUE CROSS/BLUE SHIELD | Source: Ambulatory Visit | Attending: Radiation Oncology | Admitting: Radiation Oncology

## 2016-08-23 VITALS — BP 159/81 | HR 90 | Temp 96.5°F | Resp 20 | Wt 120.8 lb

## 2016-08-23 DIAGNOSIS — F17210 Nicotine dependence, cigarettes, uncomplicated: Secondary | ICD-10-CM | POA: Insufficient documentation

## 2016-08-23 DIAGNOSIS — Z902 Acquired absence of lung [part of]: Secondary | ICD-10-CM | POA: Diagnosis not present

## 2016-08-23 DIAGNOSIS — C3431 Malignant neoplasm of lower lobe, right bronchus or lung: Secondary | ICD-10-CM

## 2016-08-23 DIAGNOSIS — Z9221 Personal history of antineoplastic chemotherapy: Secondary | ICD-10-CM | POA: Diagnosis not present

## 2016-08-23 DIAGNOSIS — Z85118 Personal history of other malignant neoplasm of bronchus and lung: Secondary | ICD-10-CM | POA: Insufficient documentation

## 2016-08-23 DIAGNOSIS — Z923 Personal history of irradiation: Secondary | ICD-10-CM | POA: Diagnosis not present

## 2016-08-23 DIAGNOSIS — J385 Laryngeal spasm: Secondary | ICD-10-CM | POA: Insufficient documentation

## 2016-08-23 NOTE — Progress Notes (Signed)
Radiation Oncology Follow up Note  Name: Claudia Chavez   Date:   08/23/2016 MRN:  [id number] DOB: 1956/02/05    This 60 y.o. female presents to the clinic today for 10 month follow-up status post combined modality treatment for stage IIIa (T3 N2 M0) squamous cell carcinoma the right lung.  REFERRING PROVIDER: Maryland Pink, MD  HPI: Patient is a 60 year old female now out 10 months having completed radiation therapy to her chest status post new adjuvant chemotherapy for a T3 N2 M0 squamous cell carcinoma the right lung status post new adjuvant chemotherapy than right lower lobectomy with surgical pathology showing close margins at the staple line. She received adjuvant radiation therapy. She seen today in routine follow-up and is doing well. She specifically denies cough hemoptysis or chest tightness. She's had a couple of episodes of what sounds like laryngeal spasm with difficulty breathing which lasted several minutes her last episode of this was 3 months ago. She recently had a CT scan showing. No evidence of lung cancer recurrence. There is some slight prominence the right hilar region which will be followed.  COMPLICATIONS OF TREATMENT: none  FOLLOW UP COMPLIANCE: keeps appointments   PHYSICAL EXAM:  BP (!) 159/81   Pulse 90   Temp (!) 96.5 F (35.8 C)   Resp 20   Wt 120 lb 13 oz (54.8 kg)   BMI 22.83 kg/m  Well-developed well-nourished patient in NAD. HEENT reveals PERLA, EOMI, discs not visualized.  Oral cavity is clear. No oral mucosal lesions are identified. Neck is clear without evidence of cervical or supraclavicular adenopathy. Lungs are clear to A&P. Cardiac examination is essentially unremarkable with regular rate and rhythm without murmur rub or thrill. Abdomen is benign with no organomegaly or masses noted. Motor sensory and DTR levels are equal and symmetric in the upper and lower extremities. Cranial nerves II through XII are grossly intact. Proprioception is intact.  No peripheral adenopathy or edema is identified. No motor or sensory levels are noted. Crude visual fields are within normal range.  RADIOLOGY RESULTS: Recent CT scan reviewed and compatible with the above-stated findings and compared to prior scans  PLAN: Present time she is doing extremely well with no evidence of disease based on recent CT scan. I believe those episodes she's having her laryngeal spasms would not offer any other therapy at this time since his been about 3 months and status happened. I've otherwise asked to see her back in 6 months for follow-up. Patient knows to call sooner with any concerns.  I would like to take this opportunity to thank you for allowing me to participate in the care of your patient.Armstead Peaks., MD

## 2016-10-03 ENCOUNTER — Other Ambulatory Visit: Payer: Self-pay | Admitting: *Deleted

## 2016-10-03 MED ORDER — LANSOPRAZOLE 30 MG PO CPDR: 30.0000 mg | DELAYED_RELEASE_CAPSULE | Freq: Every day | ORAL | 6 refills | Status: DC

## 2016-11-09 ENCOUNTER — Ambulatory Visit: Payer: BLUE CROSS/BLUE SHIELD

## 2016-11-10 ENCOUNTER — Ambulatory Visit
Admission: RE | Admit: 2016-11-10 | Discharge: 2016-11-10 | Disposition: A | Discharge status: Home / Self Care | Payer: BLUE CROSS/BLUE SHIELD | Source: Ambulatory Visit | Attending: Internal Medicine | Admitting: Internal Medicine

## 2016-11-10 DIAGNOSIS — R918 Other nonspecific abnormal finding of lung field: Secondary | ICD-10-CM | POA: Insufficient documentation

## 2016-11-10 DIAGNOSIS — C3431 Malignant neoplasm of lower lobe, right bronchus or lung: Secondary | ICD-10-CM | POA: Diagnosis not present

## 2016-11-10 DIAGNOSIS — I2510 Atherosclerotic heart disease of native coronary artery without angina pectoris: Secondary | ICD-10-CM | POA: Insufficient documentation

## 2016-11-10 DIAGNOSIS — I700 Atherosclerosis of aorta: Secondary | ICD-10-CM | POA: Insufficient documentation

## 2016-11-10 DIAGNOSIS — K5730 Diverticulosis of large intestine without perforation or abscess without bleeding: Secondary | ICD-10-CM | POA: Insufficient documentation

## 2016-11-10 DIAGNOSIS — J439 Emphysema, unspecified: Secondary | ICD-10-CM | POA: Diagnosis not present

## 2016-11-10 LAB — GLUCOSE, CAPILLARY: GLUCOSE-CAPILLARY: 89 mg/dL (ref 65–99)

## 2016-11-10 MED ORDER — FLUDEOXYGLUCOSE F - 18 (FDG) INJECTION
12.3100 | Freq: Once | INTRAVENOUS | Status: AC | PRN
  Administered 2016-11-10: 12.31 via INTRAVENOUS

## 2016-11-16 ENCOUNTER — Inpatient Hospital Stay: Payer: BLUE CROSS/BLUE SHIELD | Admitting: Internal Medicine

## 2016-12-28 ENCOUNTER — Inpatient Hospital Stay: Payer: Self-pay | Admitting: Internal Medicine

## 2017-01-25 ENCOUNTER — Inpatient Hospital Stay: Payer: BLUE CROSS/BLUE SHIELD

## 2017-01-25 ENCOUNTER — Inpatient Hospital Stay: Payer: BLUE CROSS/BLUE SHIELD | Attending: Internal Medicine | Admitting: Internal Medicine

## 2017-01-25 VITALS — BP 143/81 | HR 71 | Temp 97.8°F | Resp 16 | Wt 111.1 lb

## 2017-01-25 DIAGNOSIS — Z9221 Personal history of antineoplastic chemotherapy: Secondary | ICD-10-CM | POA: Diagnosis not present

## 2017-01-25 DIAGNOSIS — G8929 Other chronic pain: Secondary | ICD-10-CM | POA: Insufficient documentation

## 2017-01-25 DIAGNOSIS — J449 Chronic obstructive pulmonary disease, unspecified: Secondary | ICD-10-CM | POA: Diagnosis not present

## 2017-01-25 DIAGNOSIS — F17210 Nicotine dependence, cigarettes, uncomplicated: Secondary | ICD-10-CM | POA: Insufficient documentation

## 2017-01-25 DIAGNOSIS — R5383 Other fatigue: Secondary | ICD-10-CM

## 2017-01-25 DIAGNOSIS — Z79899 Other long term (current) drug therapy: Secondary | ICD-10-CM | POA: Diagnosis not present

## 2017-01-25 DIAGNOSIS — Z923 Personal history of irradiation: Secondary | ICD-10-CM | POA: Diagnosis not present

## 2017-01-25 DIAGNOSIS — G629 Polyneuropathy, unspecified: Secondary | ICD-10-CM | POA: Insufficient documentation

## 2017-01-25 DIAGNOSIS — Z95828 Presence of other vascular implants and grafts: Secondary | ICD-10-CM

## 2017-01-25 DIAGNOSIS — I700 Atherosclerosis of aorta: Secondary | ICD-10-CM | POA: Insufficient documentation

## 2017-01-25 DIAGNOSIS — J439 Emphysema, unspecified: Secondary | ICD-10-CM | POA: Insufficient documentation

## 2017-01-25 DIAGNOSIS — Z452 Encounter for adjustment and management of vascular access device: Secondary | ICD-10-CM | POA: Diagnosis not present

## 2017-01-25 DIAGNOSIS — C3431 Malignant neoplasm of lower lobe, right bronchus or lung: Secondary | ICD-10-CM

## 2017-01-25 DIAGNOSIS — M2550 Pain in unspecified joint: Secondary | ICD-10-CM | POA: Diagnosis not present

## 2017-01-25 DIAGNOSIS — K5730 Diverticulosis of large intestine without perforation or abscess without bleeding: Secondary | ICD-10-CM | POA: Insufficient documentation

## 2017-01-25 DIAGNOSIS — I2510 Atherosclerotic heart disease of native coronary artery without angina pectoris: Secondary | ICD-10-CM | POA: Diagnosis not present

## 2017-01-25 DIAGNOSIS — Z85118 Personal history of other malignant neoplasm of bronchus and lung: Secondary | ICD-10-CM

## 2017-01-25 MED ORDER — MOMETASONE FURO-FORMOTEROL FUM 200-5 MCG/ACT IN AERO: 2.0000 | INHALATION_SPRAY | Freq: Two times a day (BID) | RESPIRATORY_TRACT | 3 refills | Status: DC

## 2017-01-25 MED ORDER — HEPARIN SOD (PORK) LOCK FLUSH 100 UNIT/ML IV SOLN: 500.0000 [IU] | Freq: Once | INTRAVENOUS | Status: DC

## 2017-01-25 MED ORDER — SODIUM CHLORIDE 0.9% FLUSH
10.0000 mL | INTRAVENOUS | Status: AC | PRN
  Filled 2017-01-25: qty 10

## 2017-01-25 NOTE — Progress Notes (Signed)
[facility] OFFICE PROGRESS NOTE  Patient Care Team: Maryland Pink, MD as PCP - General (Family Medicine)  Cancer Staging Cancer of lower lobe of right lung [facility]) Staging form: Lung, AJCC 7th Edition - Clinical: Stage IIIA (T3, N2, M0) - Signed by Forest Gleason, MD on 05/07/2015    Oncology History   # Squamous cell carcinoma of right LOWER LOBE OF lung stage IIIA based on PET scan Biopsy of the (August of 2016). 2. After 3 cycles of chemotherapy patient underwent resection of lung mass (November, 2016) ypT2B ypN0 [Dr.Oaks] status post right lower lobectomy with a 3. She was started on carboplatinum and Taxol on a weekly basis and radiation therapy from January of 2017 4. After 2 cycles of carboplatinum patient had neuropathy grade 2 interfering with work so chemotherapy was put on hold and radiation was continued (January 19th, 2017) 5. Chemotherapy was discontinued because of progressive neuropathy. Patient is finishing of radiation therapy on November 19, 2015  # NOV 2017- CT- 49m right hilar LN; PET- Feb 2018- NED.      Can[place] of [place] of right lung ([facility]   05/07/2015 Initial Diagnosis    Cancer of lower lobe of right lung ([place]       INTERVAL HISTORY:  Claudia WALEN89y.o.  female pleasant patient above history of Stage III squamous cell lung cancer- status post neoadjuvant chemotherapy followed by surgery; followed by adjuvant chemotherapy; radiation therapy finished in February 2017 is here for follow-up/to review the results of the PET scan.   In the interim patient's husband passed away. She is coping fairly well.  Continues to chronic fatigue. Chronic joint pains. Not any worse. No bone pain. Complains of numbness/stinging sensation at the site of surgery-right chest wall. however intolerant to Lyrica and Neurontin. Appetite is good. No headaches. No cough. No unusual shortness of breath or chest pain. Requests refill on her COPD  medication.  REVIEW OF SYSTEMS:  A complete 10 point review of system is done which is negative except mentioned above/history of present illness.   PAST MEDICAL HISTORY :  Past Medical History:  Diagnosis Date  . [facility] [place] [facility] [place] [place]07[place]  . COPD (chronic obstructive pulmonary disease) ([place]   . Non-small cell lung cancer ([place]   . Pneumonia 04/2015    PAST SURGICAL HISTORY :   Past Surgical History:  Procedure Laterality Date  . ABDOMINAL HYSTERECTOMY    . ELBOW SURGERY Right 1995  . PORTACATH PLACEMENT Right 05/10/2015   Procedure: INSERTION PORT-A-CATH;  Surgeon: TNestor Lewandowsky MD;  Location: [facility] ORS;  Service: General;  Laterality: Right;  .Marland KitchenVIDEO ASSISTED THORACOSCOPY (VATS)/THOROCOTOMY Right 08/18/2015   Procedure: VIDEO ASSISTED THORACOSCOPY (VATS)/THOROCOTOMY;  Surgeon: TNestor Lewandowsky MD;  Location: [facility] ORS;  Service: General;  Laterality: Right;    FAMILY HISTORY :   Family History  Problem Relation Age of Onset  . Lung cancer Father 673   SOCIAL HISTORY:   Social History  Substance Use Topics  . Smoking status: Current Every Day Smoker    Packs/day: 1.00    Years: 40.00    Types: Cigarettes  . Smokeless tobacco: Not on file     Comment: "1 pk or less per day"  . Alcohol use 1.2 - 2.4 oz/week    2 - 4 Cans of beer per week     Comment: beer-occasionally    ALLERGIES:  is allergic to lyrica [pregabalin].  MEDICATIONS:  Current  Outpatient Prescriptions  Medication Sig Dispense Refill  . Ergocalciferol (VITAMIN D2 PO) Take 1 capsule by mouth every morning.    . lansoprazole (PREVACID) 30 MG capsule Take 1 capsule (30 mg total) by mouth daily at 12 noon. 30 capsule 6  . mometasone-formoterol (DULERA) 200-5 MCG/ACT AERO Inhale 2 puffs into the lungs 2 (two) times daily. 1 Inhaler 3  . oxybutynin (DITROPAN-XL) 10 MG 24 hr tablet Take 1 tablet by mouth once daily     No current facility-administered medications for this visit.     Facility-Administered Medications Ordered in Other Visits  Medication Dose Route Frequency Provider Last Rate Last Dose  . heparin lock flush 100 unit/mL  500 Units Intracatheter Once PRN Forest Gleason, MD        PHYSICAL EXAMINATION: ECOG PERFORMANCE STATUS: 0 - Asymptomatic  BP (!) 143/81 (BP Location: Left Arm, Patient Position: Sitting)   Pulse 71   Temp 97.8 F (36.6 C) (Tympanic)   Resp 16   Wt 111 lb 2 oz (50.4 kg)   BMI 21.00 kg/m   Filed Weights   01/25/17 0849  Weight: 111 lb 2 oz (50.4 kg)    GENERAL: Well-nourished well-developed; Alert, no distress and comfortable.   Alone.  EYES: no pallor or icterus OROPHARYNX: no thrush or ulceration; good dentition  NECK: supple, no masses felt LYMPH:  no palpable lymphadenopathy in the cervical, axillary or inguinal regions LUNGS: clear to auscultation and  No wheeze or crackles HEART/CVS: regular rate & rhythm and no murmurs; No lower extremity edema ABDOMEN:abdomen soft, non-tender and normal bowel sounds Musculoskeletal:no cyanosis of digits and no clubbing  PSYCH: alert & oriented x 3 with fluent speech NEURO: no focal motor/sensory deficits SKIN:  no rashes or significant lesions  LABORATORY DATA:  I have reviewed the data as listed    Component Value Date/Time   NA 134 (L) 08/09/2016 1425   K 3.8 08/09/2016 1425   CL 100 (L) 08/09/2016 1425   CO2 26 08/09/2016 1425   GLUCOSE 106 (H) 08/09/2016 1425   BUN 10 08/09/2016 1425   CREATININE 0.56 08/09/2016 1425   CALCIUM 9.2 08/09/2016 1425   PROT 7.8 08/09/2016 1425   ALBUMIN 4.3 08/09/2016 1425   AST 25 08/09/2016 1425   ALT 18 08/09/2016 1425   ALKPHOS 116 08/09/2016 1425   BILITOT 0.3 08/09/2016 1425   GFRNONAA >60 08/09/2016 1425   GFRAA >60 08/09/2016 1425    No results found for: SPEP, UPEP  Lab Results  Component Value Date   WBC 7.0 08/09/2016   NEUTROABS 4.0 08/09/2016   HGB 12.7 08/09/2016   HCT 37.0 08/09/2016   MCV 98.9 08/09/2016    PLT 293 08/09/2016      Chemistry      Component Value Date/Time   NA 134 (L) 08/09/2016 1425   K 3.8 08/09/2016 1425   CL 100 (L) 08/09/2016 1425   CO2 26 08/09/2016 1425   BUN 10 08/09/2016 1425   CREATININE 0.56 08/09/2016 1425      Component Value Date/Time   CALCIUM 9.2 08/09/2016 1425   ALKPHOS 116 08/09/2016 1425   AST 25 08/09/2016 1425   ALT 18 08/09/2016 1425   BILITOT 0.3 08/09/2016 1425     IMPRESSION: 1. Postoperative findings at the right lung base with mild pleural thickening with low-grade metabolic activity SUV of 2.7, likely postoperative. No compelling findings of recurrence. Surveillance likely concern was raised for the right hilar lymph node on the prior  diagnostic CT of the chest from 08/07/2016. Currently this node measures warranted. 2. 1.1 cm in short axis and there is no accentuated hypermetabolic activity above background mediastinal blood pool activity to suggest malignancy. 3. Other imaging findings of potential clinical significance: Coronary, aortic arch, and branch vessel atherosclerotic vascular disease. Scattered diverticula of the descending colon. Aortoiliac atherosclerotic vascular disease. Emphysema.   Electronically Signed   By: Van Clines M.D.   On: 11/10/2016 14:36  RADIOGRAPHIC STUDIES: I have personally reviewed the radiological images as listed and agreed with the findings in the report. No results found.   ASSESSMENT & PLAN:  Cancer of lower lobe of right lung ([place]) # STAGE IIIA squamous cell lung cancer status post neoadjuvant chemotherapy followed by surgery; adjuvant radiation finished in February 2017. PET scan Feb 9th 2018- no evidence of recurrence; postoperative changes/stable right hilar lymph node without any activity noted.  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   # port flush-  Long discussion regarding the need for the port versus taking it out. Given the high risk of  recurrence- decided to keep it in for now. She'll have it flushed every 8 weeks  # smoke 2-3 cig/day; recommend quitting smoking.   # COPD-stable- given a prescription for [facility]; however moving forward she will get prescription refills from her PCP  # I reviewed the  reviewed the imaging independently [as summarized above]; and with the patient in detail.    Orders Placed This Encounter  Procedures  . CT CHEST W CONTRAST    Standing Status:   Future    Standing Expiration Date:   03/27/2018    Order Specific Question:   Reason for Exam (SYMPTOM  OR DIAGNOSIS REQUIRED)    Answer:   lung cnacer; surveillance    Order Specific Question:   Preferred imaging location?    Answer:   [facility]  . CBC with Differential    Standing Status:   Future    Standing Expiration Date:   01/25/2018  . Comprehensive metabolic panel    Standing Status:   Future    Standing Expiration Date:   01/25/2018   All questions were answered. The patient knows to call the clinic with any problems, questions or concerns.      Cammie Sickle, MD 01/25/2017 9:13 AM

## 2017-01-25 NOTE — Progress Notes (Signed)
Patient here today for follow up.   

## 2017-01-25 NOTE — Assessment & Plan Note (Addendum)
#   STAGE IIIA squamous cell lung cancer status post neoadjuvant chemotherapy followed by surgery; adjuvant radiation finished in February 2017. PET scan Feb 9th 2018- no evidence of recurrence; postoperative changes/stable right hilar lymph node without any activity noted.  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   # port flush-  Long discussion regarding the need for the port versus taking it out. Given the high risk of recurrence- decided to keep it in for now. She'll have it flushed every 8 weeks  # smoke 2-3 cig/day; recommend quitting smoking.   # COPD-stable- given a prescription for [facility]; however moving forward she will get prescription refills from her PCP  # I reviewed the  reviewed the imaging independently [as summarized above]; and with the patient in detail.

## 2017-01-26 ENCOUNTER — Inpatient Hospital Stay: Payer: BLUE CROSS/BLUE SHIELD

## 2017-01-26 DIAGNOSIS — Z95828 Presence of other vascular implants and grafts: Secondary | ICD-10-CM

## 2017-01-26 DIAGNOSIS — Z85118 Personal history of other malignant neoplasm of bronchus and lung: Secondary | ICD-10-CM | POA: Diagnosis not present

## 2017-01-26 MED ORDER — SODIUM CHLORIDE 0.9% FLUSH
10.0000 mL | INTRAVENOUS | Status: DC | PRN
  Administered 2017-01-26: 10 mL via INTRAVENOUS
  Filled 2017-01-26: qty 10

## 2017-01-26 MED ORDER — HEPARIN SOD (PORK) LOCK FLUSH 100 UNIT/ML IV SOLN
500.0000 [IU] | Freq: Once | INTRAVENOUS | Status: AC
  Administered 2017-01-26: 500 [IU] via INTRAVENOUS

## 2017-01-31 ENCOUNTER — Other Ambulatory Visit: Payer: BLUE CROSS/BLUE SHIELD

## 2017-02-07 ENCOUNTER — Ambulatory Visit: Payer: BLUE CROSS/BLUE SHIELD | Admitting: Internal Medicine

## 2017-02-07 ENCOUNTER — Other Ambulatory Visit: Payer: BLUE CROSS/BLUE SHIELD

## 2017-03-01 ENCOUNTER — Ambulatory Visit
Admission: RE | Admit: 2017-03-01 | Discharge: 2017-03-01 | Disposition: A | Discharge status: Home / Self Care | Payer: BLUE CROSS/BLUE SHIELD | Source: Ambulatory Visit | Attending: Radiation Oncology | Admitting: Radiation Oncology

## 2017-03-01 ENCOUNTER — Encounter: Payer: Self-pay | Admitting: Radiation Oncology

## 2017-03-01 VITALS — BP 130/76 | HR 77 | Temp 95.9°F | Resp 20 | Wt 109.3 lb

## 2017-03-01 DIAGNOSIS — F17210 Nicotine dependence, cigarettes, uncomplicated: Secondary | ICD-10-CM | POA: Insufficient documentation

## 2017-03-01 DIAGNOSIS — Z923 Personal history of irradiation: Secondary | ICD-10-CM | POA: Insufficient documentation

## 2017-03-01 DIAGNOSIS — C3431 Malignant neoplasm of lower lobe, right bronchus or lung: Secondary | ICD-10-CM | POA: Diagnosis not present

## 2017-03-01 NOTE — Progress Notes (Signed)
Radiation Oncology Follow up Note  Name: Claudia Chavez   Date:   03/01/2017 MRN:  [id number] DOB: 1956/07/27    This 61 y.o. female presents to the clinic today for 16 month follow-up status post concurrent chemoradiation for stage IIIa squamous cell carcinoma the right lung.  REFERRING PROVIDER: Maryland Pink, MD  HPI: patient is a 61 year old female now about 16 months having completed combined modality treatment with chemotherapy and radiation therapy for stage IIIa (T3 N2 M0) squamous cell carcinoma the right lung. She is seen today in routine follow-up and is doing well unfortunately her husband who is a former patient of ours recently expired. She specifically denies cough hemoptysis or chest tightness..recent PET CT scan in February showed postoperative changes no compelling findings for recurrence. She is scheduled for follow-up CT scan inAugust.  COMPLICATIONS OF TREATMENT: none  FOLLOW UP COMPLIANCE: keeps appointments   PHYSICAL EXAM:  BP 130/76   Pulse 77   Temp (!) 95.9 F (35.5 C)   Resp 20   Wt 109 lb 5.6 oz (49.6 kg)   BMI 20.66 kg/m  Thin female in NAD. Well-developed well-nourished patient in NAD. HEENT reveals PERLA, EOMI, discs not visualized.  Oral cavity is clear. No oral mucosal lesions are identified. Neck is clear without evidence of cervical or supraclavicular adenopathy. Lungs are clear to A&P. Cardiac examination is essentially unremarkable with regular rate and rhythm without murmur rub or thrill. Abdomen is benign with no organomegaly or masses noted. Motor sensory and DTR levels are equal and symmetric in the upper and lower extremities. Cranial nerves II through XII are grossly intact. Proprioception is intact. No peripheral adenopathy or edema is identified. No motor or sensory levels are noted. Crude visual fields are within normal range.  RADIOLOGY RESULTS: PET CT scan reviewed and compatible with the above-stated findings  PLAN: present time she  is doing well with excellent response by PET CT criteria. I am please were overall progress. I've asked to see her back in 1 year for follow-up. Will review her CT scan to be performed in August. Patient knows to call sooner with any concerns.  I would like to take this opportunity to thank you for allowing me to participate in the care of your patient.Armstead Peaks., MD

## 2017-03-29 ENCOUNTER — Inpatient Hospital Stay: Payer: BLUE CROSS/BLUE SHIELD | Attending: Internal Medicine

## 2017-03-29 DIAGNOSIS — Z85118 Personal history of other malignant neoplasm of bronchus and lung: Secondary | ICD-10-CM | POA: Insufficient documentation

## 2017-03-29 DIAGNOSIS — Z95828 Presence of other vascular implants and grafts: Secondary | ICD-10-CM

## 2017-03-29 DIAGNOSIS — Z452 Encounter for adjustment and management of vascular access device: Secondary | ICD-10-CM | POA: Insufficient documentation

## 2017-03-29 MED ORDER — SODIUM CHLORIDE 0.9% FLUSH
10.0000 mL | INTRAVENOUS | Status: DC | PRN
  Administered 2017-03-29: 10 mL via INTRAVENOUS
  Filled 2017-03-29: qty 10

## 2017-03-29 MED ORDER — HEPARIN SOD (PORK) LOCK FLUSH 100 UNIT/ML IV SOLN
500.0000 [IU] | Freq: Once | INTRAVENOUS | Status: AC
  Administered 2017-03-29: 500 [IU] via INTRAVENOUS

## 2017-05-25 ENCOUNTER — Ambulatory Visit
Admission: RE | Admit: 2017-05-25 | Discharge: 2017-05-25 | Disposition: A | Discharge status: Home / Self Care | Payer: BLUE CROSS/BLUE SHIELD | Source: Ambulatory Visit | Attending: Internal Medicine | Admitting: Internal Medicine

## 2017-05-25 DIAGNOSIS — C3431 Malignant neoplasm of lower lobe, right bronchus or lung: Secondary | ICD-10-CM | POA: Diagnosis present

## 2017-05-25 DIAGNOSIS — Z902 Acquired absence of lung [part of]: Secondary | ICD-10-CM | POA: Insufficient documentation

## 2017-05-25 DIAGNOSIS — R911 Solitary pulmonary nodule: Secondary | ICD-10-CM | POA: Diagnosis not present

## 2017-05-25 DIAGNOSIS — J90 Pleural effusion, not elsewhere classified: Secondary | ICD-10-CM | POA: Insufficient documentation

## 2017-05-25 DIAGNOSIS — R590 Localized enlarged lymph nodes: Secondary | ICD-10-CM | POA: Insufficient documentation

## 2017-05-25 DIAGNOSIS — J432 Centrilobular emphysema: Secondary | ICD-10-CM | POA: Diagnosis not present

## 2017-05-25 DIAGNOSIS — I2510 Atherosclerotic heart disease of native coronary artery without angina pectoris: Secondary | ICD-10-CM | POA: Diagnosis not present

## 2017-05-25 DIAGNOSIS — I700 Atherosclerosis of aorta: Secondary | ICD-10-CM | POA: Insufficient documentation

## 2017-05-25 LAB — POCT I-STAT CREATININE: CREATININE: 0.6 mg/dL (ref 0.44–1.00)

## 2017-05-25 MED ORDER — IOPAMIDOL (ISOVUE-300) INJECTION 61%
75.0000 mL | Freq: Once | INTRAVENOUS | Status: AC | PRN
  Administered 2017-05-25: 75 mL via INTRAVENOUS

## 2017-05-28 ENCOUNTER — Inpatient Hospital Stay: Payer: BLUE CROSS/BLUE SHIELD | Attending: Internal Medicine

## 2017-05-28 ENCOUNTER — Inpatient Hospital Stay (HOSPITAL_BASED_OUTPATIENT_CLINIC_OR_DEPARTMENT_OTHER): Payer: BLUE CROSS/BLUE SHIELD | Admitting: Internal Medicine

## 2017-05-28 ENCOUNTER — Telehealth: Payer: Self-pay | Admitting: Internal Medicine

## 2017-05-28 VITALS — BP 151/103 | HR 109 | Temp 98.1°F | Resp 16 | Wt 100.3 lb

## 2017-05-28 DIAGNOSIS — F17210 Nicotine dependence, cigarettes, uncomplicated: Secondary | ICD-10-CM

## 2017-05-28 DIAGNOSIS — J449 Chronic obstructive pulmonary disease, unspecified: Secondary | ICD-10-CM | POA: Diagnosis not present

## 2017-05-28 DIAGNOSIS — Z9221 Personal history of antineoplastic chemotherapy: Secondary | ICD-10-CM

## 2017-05-28 DIAGNOSIS — R918 Other nonspecific abnormal finding of lung field: Secondary | ICD-10-CM | POA: Insufficient documentation

## 2017-05-28 DIAGNOSIS — Z95828 Presence of other vascular implants and grafts: Secondary | ICD-10-CM

## 2017-05-28 DIAGNOSIS — C3431 Malignant neoplasm of lower lobe, right bronchus or lung: Secondary | ICD-10-CM | POA: Diagnosis present

## 2017-05-28 DIAGNOSIS — Z923 Personal history of irradiation: Secondary | ICD-10-CM | POA: Insufficient documentation

## 2017-05-28 LAB — CBC WITH DIFFERENTIAL/PLATELET
BASOS ABS: 0.1 10*3/uL (ref 0–0.1)
Basophils Relative: 1 %
EOS ABS: 0.1 10*3/uL (ref 0–0.7)
EOS PCT: 2 %
HCT: 37.3 % (ref 35.0–47.0)
HEMOGLOBIN: 13.1 g/dL (ref 12.0–16.0)
Lymphocytes Relative: 29 %
Lymphs Abs: 1.9 10*3/uL (ref 1.0–3.6)
MCH: 36 pg — ABNORMAL HIGH (ref 26.0–34.0)
MCHC: 35 g/dL (ref 32.0–36.0)
MCV: 102.6 fL — ABNORMAL HIGH (ref 80.0–100.0)
Monocytes Absolute: 0.6 10*3/uL (ref 0.2–0.9)
Monocytes Relative: 9 %
NEUTROS PCT: 59 %
Neutro Abs: 3.9 10*3/uL (ref 1.4–6.5)
PLATELETS: 281 10*3/uL (ref 150–440)
RBC: 3.64 MIL/uL — AB (ref 3.80–5.20)
RDW: 15.1 % — ABNORMAL HIGH (ref 11.5–14.5)
WBC: 6.6 10*3/uL (ref 3.6–11.0)

## 2017-05-28 LAB — COMPREHENSIVE METABOLIC PANEL
ALT: 15 U/L (ref 14–54)
AST: 22 U/L (ref 15–41)
Albumin: 3.8 g/dL (ref 3.5–5.0)
Alkaline Phosphatase: 98 U/L (ref 38–126)
Anion gap: 7 (ref 5–15)
BUN: 16 mg/dL (ref 6–20)
CHLORIDE: 104 mmol/L (ref 101–111)
CO2: 25 mmol/L (ref 22–32)
CREATININE: 0.63 mg/dL (ref 0.44–1.00)
Calcium: 8.8 mg/dL — ABNORMAL LOW (ref 8.9–10.3)
GFR calc Af Amer: >60 mL/min (ref >60)
GFR calc non Af Amer: >60 mL/min (ref >60)
Glucose, Bld: 140 mg/dL — ABNORMAL HIGH (ref 65–99)
Potassium: 3.4 mmol/L — ABNORMAL LOW (ref 3.5–5.1)
SODIUM: 136 mmol/L (ref 135–145)
Total Bilirubin: 0.3 mg/dL (ref 0.3–1.2)
Total Protein: 7.1 g/dL (ref 6.5–8.1)

## 2017-05-28 MED ORDER — SODIUM CHLORIDE 0.9% FLUSH
10.0000 mL | INTRAVENOUS | Status: DC | PRN
  Administered 2017-05-28: 10 mL via INTRAVENOUS
  Filled 2017-05-28: qty 10

## 2017-05-28 MED ORDER — HEPARIN SOD (PORK) LOCK FLUSH 100 UNIT/ML IV SOLN
500.0000 [IU] | Freq: Once | INTRAVENOUS | Status: AC
  Administered 2017-05-28: 500 [IU] via INTRAVENOUS

## 2017-05-28 NOTE — Assessment & Plan Note (Addendum)
#   STAGE IIIA squamous cell lung cancer status post neoadjuvant chemotherapy followed by surgery; adjuvant radiation finished in February 2017. Surveillance CT scan August 2018- concerning for recurrence/new primary [see below]  # CT AUG 24th 2018- shows ~1cm cavitary lesion in RUL; right hilar LN.  Recommend PET scan ASAP. Will discuss at tumor conference- regarding the next plan of care. Labs within normal limits.  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   # port flush-  Port flushed today;  She'll have it flushed every 8 weeks  # smoke 2-3 cig/day; recommend quitting smoking.   # COPD-STABLE.   # follow up based on tumor conference recommendations; Hayley will contact pt re: plan after TC.   # I reviewed the  reviewed the imaging independently [as summarized above]; and with the patient in detail.

## 2017-05-28 NOTE — Progress Notes (Signed)
[facility] OFFICE PROGRESS NOTE  Patient Care Team: Maryland Pink, MD as PCP - General (Family Medicine)  Cancer Staging Cancer of lower lobe of right lung [facility]) Staging form: Lung, AJCC 7th Edition - Clinical: Stage IIIA (T3, N2, M0) - Signed by Forest Gleason, MD on 05/07/2015    Oncology History   # Squamous cell carcinoma of right LOWER LOBE OF lung stage IIIA based on PET scan Biopsy of the (August of 2016). 2. After 3 cycles of chemotherapy patient underwent resection of lung mass (November, 2016) ypT2B ypN0 [Dr.Oaks] status post right lower lobectomy with a 3. She was started on carboplatinum and Taxol on a weekly basis and radiation therapy from January of 2017 4. After 2 cycles of carboplatinum patient had neuropathy grade 2 interfering with work so chemotherapy was put on hold and radiation was continued (January 19th, 2017) 5. Chemotherapy was discontinued because of progressive neuropathy. Patient is finishing of radiation therapy on November 19, 2015  # NOV 2017- CT- 14mm right hilar LN; PET- Feb 2018- NED.      Cancer of lower lobe of right lung ([place])   05/07/2015 Initial Diagnosis    Cancer of lower lobe of right lung ([place])       INTERVAL HISTORY:  Claudia Chavez 61 y.o.  female pleasant patient above history of Stage III squamous cell lung cancer- Is currently here to review the results of her surveillance CT scan.  Patient continues to complain of chronic numbness and stinging sensation at the right chest wall surgical site. She has been intolerant to Neurontin/Lyrica.  Patient denies any headaches. Denies any nausea vomiting diarrhea. Continues to have chronic fatigue. Chronic joint pains. Not any worse. No bone pain. Mild chronic shortness of breath or chronic cough. No hemoptysis.  REVIEW OF SYSTEMS:  A complete 10 point review of system is done which is negative except mentioned above/history of present illness.   PAST MEDICAL  HISTORY :  Past Medical History:  Diagnosis Date  . Cancer of lower lobe of right lung ([place]) 05/07/2015  . COPD (chronic obstructive pulmonary disease) ([place])   . Non-small cell lung cancer ([place])   . Pneumonia 04/2015    PAST SURGICAL HISTORY :   Past Surgical History:  Procedure Laterality Date  . ABDOMINAL HYSTERECTOMY    . ELBOW SURGERY Right 1995  . PORTACATH PLACEMENT Right 05/10/2015   Procedure: INSERTION PORT-A-CATH;  Surgeon: Nestor Lewandowsky, MD;  Location: [facility] ORS;  Service: General;  Laterality: Right;  Marland Kitchen VIDEO ASSISTED THORACOSCOPY (VATS)/THOROCOTOMY Right 08/18/2015   Procedure: VIDEO ASSISTED THORACOSCOPY (VATS)/THOROCOTOMY;  Surgeon: Nestor Lewandowsky, MD;  Location: [facility] ORS;  Service: General;  Laterality: Right;    FAMILY HISTORY :   Family History  Problem Relation Age of Onset  . Lung cancer Father 37    SOCIAL HISTORY:   Social History  Substance Use Topics  . Smoking status: Current Every Day Smoker    Packs/day: 1.00    Years: 40.00    Types: Cigarettes  . Smokeless tobacco: Not on file     Comment: "1 pk or less per day"  . Alcohol use 1.2 - 2.4 oz/week    2 - 4 Cans of beer per week     Comment: beer-occasionally    ALLERGIES:  is allergic to lyrica [pregabalin].  MEDICATIONS:  Current Outpatient Prescriptions  Medication Sig Dispense Refill  . Ergocalciferol (VITAMIN D2 PO) Take 1 capsule by mouth every morning.    Marland Kitchen  Fluticasone-Salmeterol (ADVAIR DISKUS) 250-50 MCG/DOSE AEPB Inhale into the lungs.    Marland Kitchen oxybutynin (DITROPAN-XL) 10 MG 24 hr tablet Take 1 tablet by mouth once daily    . budesonide (PULMICORT) 0.5 MG/2ML nebulizer solution Inhale into the lungs.    . diazepam (VALIUM) 5 MG tablet Take 1 tablet PO 1 hour before flight, may repeat x1 if needed.    . mometasone-formoterol (DULERA) 200-5 MCG/ACT AERO Inhale 2 puffs into the lungs 2 (two) times daily. (Patient not taking: Reported on 05/28/2017) 1 Inhaler 3   No current facility-administered  medications for this visit.    Facility-Administered Medications Ordered in Other Visits  Medication Dose Route Frequency Provider Last Rate Last Dose  . heparin lock flush 100 unit/mL  500 Units Intracatheter Once PRN Choksi, Delorise Shiner, MD      . heparin lock flush 100 unit/mL  500 Units Intravenous Once Charlaine Dalton R, MD      . sodium chloride flush (NS) 0.9 % injection 10 mL  10 mL Intravenous PRN Cammie Sickle, MD        PHYSICAL EXAMINATION: ECOG PERFORMANCE STATUS: 0 - Asymptomatic  BP (!) 151/103 (BP Location: Left Arm, Patient Position: Sitting)   Pulse (!) 109   Temp 98.1 F (36.7 C) (Tympanic)   Resp 16   Wt 100 lb 4.8 oz (45.5 kg)   BMI 18.95 kg/m   Filed Weights   05/28/17 1340  Weight: 100 lb 4.8 oz (45.5 kg)    GENERAL: Well-nourished well-developed; Alert, no distress and comfortable.   Alone.  EYES: no pallor or icterus OROPHARYNX: no thrush or ulceration; good dentition  NECK: supple, no masses felt LYMPH:  no palpable lymphadenopathy in the cervical, axillary or inguinal regions LUNGS: clear to auscultation and  No wheeze or crackles HEART/CVS: regular rate & rhythm and no murmurs; No lower extremity edema ABDOMEN:abdomen soft, non-tender and normal bowel sounds Musculoskeletal:no cyanosis of digits and no clubbing  PSYCH: alert & oriented x 3 with fluent speech NEURO: no focal motor/sensory deficits SKIN:  no rashes or significant lesions  LABORATORY DATA:  I have reviewed the data as listed    Component Value Date/Time   NA 136 05/28/2017 1315   K 3.4 (L) 05/28/2017 1315   CL 104 05/28/2017 1315   CO2 25 05/28/2017 1315   GLUCOSE 140 (H) 05/28/2017 1315   BUN 16 05/28/2017 1315   CREATININE 0.63 05/28/2017 1315   CALCIUM 8.8 (L) 05/28/2017 1315   PROT 7.1 05/28/2017 1315   ALBUMIN 3.8 05/28/2017 1315   AST 22 05/28/2017 1315   ALT 15 05/28/2017 1315   ALKPHOS 98 05/28/2017 1315   BILITOT 0.3 05/28/2017 1315   GFRNONAA >60  05/28/2017 1315   GFRAA >60 05/28/2017 1315    No results found for: SPEP, UPEP  Lab Results  Component Value Date   WBC 6.6 05/28/2017   NEUTROABS 3.9 05/28/2017   HGB 13.1 05/28/2017   HCT 37.3 05/28/2017   MCV 102.6 (H) 05/28/2017   PLT 281 05/28/2017      Chemistry      Component Value Date/Time   NA 136 05/28/2017 1315   K 3.4 (L) 05/28/2017 1315   CL 104 05/28/2017 1315   CO2 25 05/28/2017 1315   BUN 16 05/28/2017 1315   CREATININE 0.63 05/28/2017 1315      Component Value Date/Time   CALCIUM 8.8 (L) 05/28/2017 1315   ALKPHOS 98 05/28/2017 1315   AST 22 05/28/2017 1315  ALT 15 05/28/2017 1315   BILITOT 0.3 05/28/2017 1315     IMPRESSION: 1. Surgical changes of at least partial right lower lobectomy. 2. New partially cavitary right upper lobe pulmonary nodule, most consistent with metastatic disease versus metachronous primary. 3. Slight enlargement of a borderline pathologically sized right hilar lymph node. Indeterminate. Cannot exclude nodal metastasis. 4. Similar small right pleural effusion. 5.  Emphysema (ICD10-J43.9). 6. Coronary artery atherosclerosis. Aortic Atherosclerosis (ICD10-I70.0). Dictation #1 [id number]  [id number]   Electronically Signed   By: Abigail Miyamoto M.D.   On: 05/25/2017 11:23   RADIOGRAPHIC STUDIES: I have personally reviewed the radiological images as listed and agreed with the findings in the report. No results found.   ASSESSMENT & PLAN:  Cancer of lower lobe of right lung ([place]) # STAGE IIIA squamous cell lung cancer status post neoadjuvant chemotherapy followed by surgery; adjuvant radiation finished in February 2017. Surveillance CT scan August 2018- concerning for recurrence/new primary [see below]  # CT AUG 24th 2018- shows ~1cm cavitary lesion in RUL; right hilar LN.  Recommend PET scan ASAP. Will discuss at tumor conference- regarding the next plan of care. Labs within normal limits.  # Post thoracotomy  syndrome /PN- G-1 intol to Neurontin/Lyrica.   # port flush-  Port flushed today;  She'll have it flushed every 8 weeks  # smoke 2-3 cig/day; recommend quitting smoking.   # COPD-STABLE.   # follow up based on tumor conference recommendations; Hayley will contact pt re: plan after TC.   # I reviewed the  reviewed the imaging independently [as summarized above]; and with the patient in detail.    Orders Placed This Encounter  Procedures  . NM PET Image Restag (PS) Skull Base To Thigh    Standing Status:   Future    Standing Expiration Date:   05/28/2018    Order Specific Question:   If indicated for the ordered procedure, I authorize the administration of a radiopharmaceutical per Radiology protocol    Answer:   Yes    Order Specific Question:   Preferred imaging location?    Answer:   [facility]    Order Specific Question:   Radiology Contrast Protocol - do NOT remove file path    Answer:   [email or web address]    Order Specific Question:   Reason for Exam additional comments    Answer:   lung nodule; hx of lug cancer   All questions were answered. The patient knows to call the clinic with any problems, questions or concerns.      Cammie Sickle, MD 05/29/2017 7:37 AM

## 2017-05-28 NOTE — Telephone Encounter (Signed)
Per 05/28/17 los, schd PET before  05/31/17.  Per Centralized scheduling, next available is Friday, 06/01/17. Advised Heather/Dr B's nurse and she stated that 06/01/17 is ok.

## 2017-05-31 ENCOUNTER — Other Ambulatory Visit: Payer: Self-pay | Admitting: *Deleted

## 2017-05-31 ENCOUNTER — Telehealth: Payer: Self-pay | Admitting: Internal Medicine

## 2017-05-31 DIAGNOSIS — C3431 Malignant neoplasm of lower lobe, right bronchus or lung: Secondary | ICD-10-CM

## 2017-05-31 NOTE — Telephone Encounter (Signed)
PET approved; Authorization number for PET- [id number]- until sep 28th 2018. Thx  Hayley- take pt off tumor conference schedule as she has not had her PET yet; will present at next tumor conference.

## 2017-06-01 ENCOUNTER — Encounter
Admission: RE | Admit: 2017-06-01 | Discharge: 2017-06-01 | Disposition: A | Discharge status: Home / Self Care | Payer: BLUE CROSS/BLUE SHIELD | Source: Ambulatory Visit | Attending: Internal Medicine | Admitting: Internal Medicine

## 2017-06-01 DIAGNOSIS — C3431 Malignant neoplasm of lower lobe, right bronchus or lung: Secondary | ICD-10-CM | POA: Insufficient documentation

## 2017-06-01 LAB — GLUCOSE, CAPILLARY: Glucose-Capillary: 103 mg/dL — ABNORMAL HIGH (ref 65–99)

## 2017-06-01 MED ORDER — FLUDEOXYGLUCOSE F - 18 (FDG) INJECTION
12.1700 | Freq: Once | INTRAVENOUS | Status: AC | PRN
  Administered 2017-06-01: 12.17 via INTRAVENOUS

## 2017-06-07 ENCOUNTER — Telehealth: Payer: Self-pay | Admitting: Internal Medicine

## 2017-06-07 DIAGNOSIS — C3431 Malignant neoplasm of lower lobe, right bronchus or lung: Secondary | ICD-10-CM

## 2017-06-07 NOTE — Telephone Encounter (Signed)
Pt has been made aware of results and discussion from tumor conference. Message sent to scheduling to schedule pt for CT scan in 4 months and follow up with Dr. B few days after scan for results. Pt informed that will be contacted by scheduling with her next appt. Pt verbalized understanding.

## 2017-06-07 NOTE — Telephone Encounter (Signed)
Claudia Chavez. Please inform patient that her case was discussed at tumor conference today. The spot  In the lung was not impressive on PET scan and small - hence It was recommended to follow up with imaging/CT scan with contrast- 4 months.

## 2017-07-23 ENCOUNTER — Inpatient Hospital Stay: Payer: BLUE CROSS/BLUE SHIELD | Attending: Internal Medicine

## 2017-07-23 DIAGNOSIS — C3431 Malignant neoplasm of lower lobe, right bronchus or lung: Secondary | ICD-10-CM | POA: Insufficient documentation

## 2017-07-23 DIAGNOSIS — Z452 Encounter for adjustment and management of vascular access device: Secondary | ICD-10-CM | POA: Insufficient documentation

## 2017-07-23 DIAGNOSIS — Z95828 Presence of other vascular implants and grafts: Secondary | ICD-10-CM

## 2017-07-23 MED ORDER — HEPARIN SOD (PORK) LOCK FLUSH 100 UNIT/ML IV SOLN
500.0000 [IU] | INTRAVENOUS | Status: AC | PRN
  Administered 2017-07-23: 500 [IU]

## 2017-07-23 MED ORDER — SODIUM CHLORIDE 0.9% FLUSH
10.0000 mL | INTRAVENOUS | Status: AC | PRN
  Administered 2017-07-23: 10 mL
  Filled 2017-07-23: qty 10

## 2017-09-17 ENCOUNTER — Inpatient Hospital Stay: Payer: BLUE CROSS/BLUE SHIELD | Attending: Internal Medicine

## 2017-09-17 DIAGNOSIS — C3432 Malignant neoplasm of lower lobe, left bronchus or lung: Secondary | ICD-10-CM | POA: Diagnosis not present

## 2017-09-17 DIAGNOSIS — Z95828 Presence of other vascular implants and grafts: Secondary | ICD-10-CM

## 2017-09-17 DIAGNOSIS — C3431 Malignant neoplasm of lower lobe, right bronchus or lung: Secondary | ICD-10-CM | POA: Diagnosis present

## 2017-09-17 DIAGNOSIS — Z452 Encounter for adjustment and management of vascular access device: Secondary | ICD-10-CM | POA: Diagnosis not present

## 2017-09-17 MED ORDER — HEPARIN SOD (PORK) LOCK FLUSH 100 UNIT/ML IV SOLN: 500.0000 [IU] | Freq: Once | INTRAVENOUS | Status: DC

## 2017-09-17 MED ORDER — SODIUM CHLORIDE 0.9% FLUSH
10.0000 mL | INTRAVENOUS | Status: DC | PRN
  Filled 2017-09-17: qty 10

## 2017-10-08 ENCOUNTER — Ambulatory Visit
Admission: RE | Admit: 2017-10-08 | Discharge: 2017-10-08 | Disposition: A | Discharge status: Home / Self Care | Payer: BLUE CROSS/BLUE SHIELD | Source: Ambulatory Visit | Attending: Internal Medicine | Admitting: Internal Medicine

## 2017-10-08 ENCOUNTER — Ambulatory Visit: Payer: BLUE CROSS/BLUE SHIELD

## 2017-10-08 DIAGNOSIS — R918 Other nonspecific abnormal finding of lung field: Secondary | ICD-10-CM | POA: Insufficient documentation

## 2017-10-08 DIAGNOSIS — I2510 Atherosclerotic heart disease of native coronary artery without angina pectoris: Secondary | ICD-10-CM | POA: Insufficient documentation

## 2017-10-08 DIAGNOSIS — J432 Centrilobular emphysema: Secondary | ICD-10-CM | POA: Insufficient documentation

## 2017-10-08 DIAGNOSIS — C3431 Malignant neoplasm of lower lobe, right bronchus or lung: Secondary | ICD-10-CM

## 2017-10-08 DIAGNOSIS — I700 Atherosclerosis of aorta: Secondary | ICD-10-CM | POA: Insufficient documentation

## 2017-10-08 DIAGNOSIS — C3491 Malignant neoplasm of unspecified part of right bronchus or lung: Secondary | ICD-10-CM | POA: Diagnosis present

## 2017-10-08 LAB — POCT I-STAT CREATININE: CREATININE: 0.6 mg/dL (ref 0.44–1.00)

## 2017-10-08 MED ORDER — IOPAMIDOL (ISOVUE-300) INJECTION 61%
75.0000 mL | Freq: Once | INTRAVENOUS | Status: AC | PRN
  Administered 2017-10-08: 75 mL via INTRAVENOUS

## 2017-10-10 ENCOUNTER — Encounter: Payer: Self-pay | Admitting: Internal Medicine

## 2017-10-10 ENCOUNTER — Inpatient Hospital Stay: Payer: BLUE CROSS/BLUE SHIELD | Attending: Internal Medicine | Admitting: Internal Medicine

## 2017-10-10 VITALS — BP 122/79 | HR 97 | Temp 97.8°F | Resp 16 | Wt 101.2 lb

## 2017-10-10 DIAGNOSIS — Z720 Tobacco use: Secondary | ICD-10-CM | POA: Diagnosis not present

## 2017-10-10 DIAGNOSIS — M2550 Pain in unspecified joint: Secondary | ICD-10-CM | POA: Insufficient documentation

## 2017-10-10 DIAGNOSIS — C3431 Malignant neoplasm of lower lobe, right bronchus or lung: Secondary | ICD-10-CM | POA: Insufficient documentation

## 2017-10-10 DIAGNOSIS — C771 Secondary and unspecified malignant neoplasm of intrathoracic lymph nodes: Secondary | ICD-10-CM | POA: Diagnosis not present

## 2017-10-10 DIAGNOSIS — M2500 Hemarthrosis, unspecified joint: Secondary | ICD-10-CM

## 2017-10-10 DIAGNOSIS — J449 Chronic obstructive pulmonary disease, unspecified: Secondary | ICD-10-CM

## 2017-10-10 DIAGNOSIS — R5382 Chronic fatigue, unspecified: Secondary | ICD-10-CM | POA: Diagnosis not present

## 2017-10-10 NOTE — Progress Notes (Signed)
[facility] OFFICE PROGRESS NOTE  Patient Care Team: Maryland Pink, MD as PCP - General (Family Medicine)  Cancer Staging Cancer of lower lobe of right lung [facility]) Staging form: Lung, AJCC 7th Edition - Clinical: Stage IIIA (T3, N2, M0) - Signed by Forest Gleason, MD on 05/07/2015    Oncology History   # Squamous cell carcinoma of right LOWER LOBE OF lung stage IIIA based on PET scan Biopsy of the (August of 2016). 2. After 3 cycles of chemotherapy patient underwent resection of lung mass (November, 2016) ypT2B ypN0 [Dr.Oaks] status post right lower lobectomy with a 3. She was started on carboplatinum and Taxol on a weekly basis and radiation therapy from January of 2017 4. After 2 cycles of carboplatinum patient had neuropathy grade 2 interfering with work so chemotherapy was put on hold and radiation was continued (January 19th, 2017) 5. Chemotherapy was discontinued because of progressive neuropathy. Patient is finishing of radiation therapy on November 19, 2015  # NOV 2017- CT- 16mm right hilar LN; PET- Feb 2018- NED.      Cancer of lower lobe of right lung ([place])    INTERVAL HISTORY:  Claudia Chavez 62 y.o.  female pleasant patient above history of Stage III squamous cell lung cancer- Is currently here to review the results of her surveillance CT scan.  Patient denies any headaches. Denies any nausea vomiting diarrhea. Continues to have chronic fatigue. Chronic joint pains. Not any worse. No bone pain. Mild chronic shortness of breath or chronic cough. No hemoptysis.  REVIEW OF SYSTEMS:  A complete 10 point review of system is done which is negative except mentioned above/history of present illness.   PAST MEDICAL HISTORY :  Past Medical History:  Diagnosis Date  . Cancer of lower lobe of right lung ([place]) 05/07/2015  . COPD (chronic obstructive pulmonary disease) ([place])   . Non-small cell lung cancer ([place])   . Pneumonia 04/2015    PAST SURGICAL  HISTORY :   Past Surgical History:  Procedure Laterality Date  . ABDOMINAL HYSTERECTOMY    . ELBOW SURGERY Right 1995  . PORTACATH PLACEMENT Right 05/10/2015   Procedure: INSERTION PORT-A-CATH;  Surgeon: Nestor Lewandowsky, MD;  Location: [facility] ORS;  Service: General;  Laterality: Right;  Marland Kitchen VIDEO ASSISTED THORACOSCOPY (VATS)/THOROCOTOMY Right 08/18/2015   Procedure: VIDEO ASSISTED THORACOSCOPY (VATS)/THOROCOTOMY;  Surgeon: Nestor Lewandowsky, MD;  Location: [facility] ORS;  Service: General;  Laterality: Right;    FAMILY HISTORY :   Family History  Problem Relation Age of Onset  . Lung cancer Father 8    SOCIAL HISTORY:   Social History   Tobacco Use  . Smoking status: Current Every Day Smoker    Packs/day: 1.00    Years: 40.00    Pack years: 40.00    Types: Cigarettes  . Tobacco comment: "1 pk or less per day"  Substance Use Topics  . Alcohol use: Yes    Alcohol/week: 1.2 - 2.4 oz    Types: 2 - 4 Cans of beer per week    Comment: beer-occasionally  . Drug use: No    ALLERGIES:  is allergic to lyrica [pregabalin].  MEDICATIONS:  Current Outpatient Medications  Medication Sig Dispense Refill  . Ergocalciferol (VITAMIN D2 PO) Take 1 capsule by mouth every morning.    . Fluticasone-Salmeterol (ADVAIR DISKUS) 250-50 MCG/DOSE AEPB Inhale into the lungs.    . budesonide (PULMICORT) 0.5 MG/2ML nebulizer solution Inhale into the lungs.    . diazepam (VALIUM) 5  MG tablet Take 1 tablet PO 1 hour before flight, may repeat x1 if needed.    . mometasone-formoterol (DULERA) 200-5 MCG/ACT AERO Inhale 2 puffs into the lungs 2 (two) times daily. (Patient not taking: Reported on 05/28/2017) 1 Inhaler 3  . oxybutynin (DITROPAN-XL) 10 MG 24 hr tablet Take 1 tablet by mouth once daily     No current facility-administered medications for this visit.    Facility-Administered Medications Ordered in Other Visits  Medication Dose Route Frequency Provider Last Rate Last Dose  . heparin lock flush 100 unit/mL  500  Units Intracatheter Once PRN Choksi, Delorise Shiner, MD      . heparin lock flush 100 unit/mL  500 Units Intravenous Once Charlaine Dalton R, MD      . sodium chloride flush (NS) 0.9 % injection 10 mL  10 mL Intravenous PRN Cammie Sickle, MD        PHYSICAL EXAMINATION: ECOG PERFORMANCE STATUS: 0 - Asymptomatic  BP 122/79 (BP Location: Left Arm, Patient Position: Sitting)   Pulse 97   Temp 97.8 F (36.6 C) (Tympanic)   Resp 16   Wt 101 lb 3.2 oz (45.9 kg)   BMI 19.12 kg/m   Filed Weights   10/10/17 1110  Weight: 101 lb 3.2 oz (45.9 kg)    GENERAL: Well-nourished well-developed; Alert, no distress and comfortable.   Alone.  EYES: no pallor or icterus OROPHARYNX: no thrush or ulceration; good dentition  NECK: supple, no masses felt LYMPH:  no palpable lymphadenopathy in the cervical, axillary or inguinal regions LUNGS: clear to auscultation and  No wheeze or crackles HEART/CVS: regular rate & rhythm and no murmurs; No lower extremity edema ABDOMEN:abdomen soft, non-tender and normal bowel sounds Musculoskeletal:no cyanosis of digits and no clubbing  PSYCH: alert & oriented x 3 with fluent speech NEURO: no focal motor/sensory deficits SKIN:  no rashes or significant lesions  LABORATORY DATA:  I have reviewed the data as listed    Component Value Date/Time   NA 136 05/28/2017 1315   K 3.4 (L) 05/28/2017 1315   CL 104 05/28/2017 1315   CO2 25 05/28/2017 1315   GLUCOSE 140 (H) 05/28/2017 1315   BUN 16 05/28/2017 1315   CREATININE 0.60 10/08/2017 0957   CALCIUM 8.8 (L) 05/28/2017 1315   PROT 7.1 05/28/2017 1315   ALBUMIN 3.8 05/28/2017 1315   AST 22 05/28/2017 1315   ALT 15 05/28/2017 1315   ALKPHOS 98 05/28/2017 1315   BILITOT 0.3 05/28/2017 1315   GFRNONAA >60 05/28/2017 1315   GFRAA >60 05/28/2017 1315    No results found for: SPEP, UPEP  Lab Results  Component Value Date   WBC 6.6 05/28/2017   NEUTROABS 3.9 05/28/2017   HGB 13.1 05/28/2017   HCT 37.3  05/28/2017   MCV 102.6 (H) 05/28/2017   PLT 281 05/28/2017      Chemistry      Component Value Date/Time   NA 136 05/28/2017 1315   K 3.4 (L) 05/28/2017 1315   CL 104 05/28/2017 1315   CO2 25 05/28/2017 1315   BUN 16 05/28/2017 1315   CREATININE 0.60 10/08/2017 0957      Component Value Date/Time   CALCIUM 8.8 (L) 05/28/2017 1315   ALKPHOS 98 05/28/2017 1315   AST 22 05/28/2017 1315   ALT 15 05/28/2017 1315   BILITOT 0.3 05/28/2017 1315     IMPRESSION: 1. Surgical changes of at least partial right lower lobectomy. 2. New partially cavitary right upper lobe  pulmonary nodule, most consistent with metastatic disease versus metachronous primary. 3. Slight enlargement of a borderline pathologically sized right hilar lymph node. Indeterminate. Cannot exclude nodal metastasis. 4. Similar small right pleural effusion. 5.  Emphysema (ICD10-J43.9). 6. Coronary artery atherosclerosis. Aortic Atherosclerosis (ICD10-I70.0). Dictation #1 [id number]  [id number]   Electronically Signed   By: Abigail Miyamoto M.D.   On: 05/25/2017 11:23 ------------------------------------------------------   IMPRESSION: 1. Right upper lobe nodule is grossly stable in size 05/25/2017 but appears more solid in appearance than on 05/25/2017 and is new from 08/07/2016. Findings are worrisome for bronchogenic carcinoma. 2. Aortic atherosclerosis (ICD10-170.0). Moderate to severe coronary artery calcification. 3.  Emphysema (ICD10-J43.9).   Electronically Signed   By: Lorin Picket M.D.   On: 10/08/2017 10:21    RADIOGRAPHIC STUDIES: I have personally reviewed the radiological images as listed and agreed with the findings in the report. No results found.   ASSESSMENT & PLAN:  Cancer of lower lobe of right lung ([place]) # STAGE IIIA squamous cell lung cancer status post neoadjuvant chemotherapy followed by surgery; adjuvant radiation finished in February 2017.   # CT JAN 2019-shows ~1cm  STABLE lesion in RUL-however more solid[previously cavitary]; right hilar LN.  Will discuss at tumor conference- regarding the next plan of care. Labs within normal limits.  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   # port flush-  Port flushed today;  She'll have it flushed every 8 weeks  # smoke 2-3 cig/day; recommend quitting smoking.   # COPD-STABLE.   # follow up based on tumor conference recommendations; Hayley will contact pt re: plan after TC.   # I reviewed the  reviewed the imaging independently [as summarized above]; and with the patient in detail.    No orders of the defined types were placed in this encounter.  All questions were answered. The patient knows to call the clinic with any problems, questions or concerns.      Cammie Sickle, MD 10/10/2017 1:33 PM

## 2017-10-10 NOTE — Assessment & Plan Note (Addendum)
#   STAGE IIIA squamous cell lung cancer status post neoadjuvant chemotherapy followed by surgery; adjuvant radiation finished in February 2017.   # CT JAN 2019-shows ~1cm STABLE lesion in RUL-however more solid[previously cavitary]; right hilar LN.  Will discuss at tumor conference- regarding the next plan of care. Labs within normal limits.  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   # port flush-  Port flushed today;  She'll have it flushed every 8 weeks  # smoke 2-3 cig/day; recommend quitting smoking.   # COPD-STABLE.   # follow up based on tumor conference recommendations; Hayley will contact pt re: plan after TC.   # I reviewed the  reviewed the imaging independently [as summarized above]; and with the patient in detail.

## 2017-10-11 ENCOUNTER — Telehealth: Payer: Self-pay | Admitting: Internal Medicine

## 2017-10-11 ENCOUNTER — Other Ambulatory Visit: Payer: Self-pay

## 2017-10-11 DIAGNOSIS — R911 Solitary pulmonary nodule: Secondary | ICD-10-CM

## 2017-10-11 NOTE — Telephone Encounter (Signed)
Pt case discussed RUL concerning; ? Wedge resection-possibility.  Discussed with Dr. Faith Rogue.  He kindly agrees to see the patient tomorrow in his clinic.  Please make referral to Dr.Oaks for tomorrow. [Dx: lung nodule]  Make a follow up with me in 4 weeks/no labs.

## 2017-10-11 NOTE — Telephone Encounter (Signed)
I contacted patient to discuss the plan of care. She stated that Dr. B had already spoken to her after conference today. She just received her apt with Dr. Genevive Bi for tomorrow with a chest xray prior to her apt. She understands that the cancer center will call her with a new apt for 4 weeks approx. From today. She gave verbal understanding that she will receive a call with her next apt with Dr. Jacinto Reap.

## 2017-10-12 ENCOUNTER — Ambulatory Visit
Admission: RE | Admit: 2017-10-12 | Discharge: 2017-10-12 | Disposition: A | Discharge status: Home / Self Care | Payer: BLUE CROSS/BLUE SHIELD | Source: Ambulatory Visit | Attending: Cardiothoracic Surgery | Admitting: Cardiothoracic Surgery

## 2017-10-12 ENCOUNTER — Encounter: Payer: Self-pay | Admitting: Cardiothoracic Surgery

## 2017-10-12 ENCOUNTER — Telehealth: Payer: Self-pay

## 2017-10-12 ENCOUNTER — Ambulatory Visit: Payer: BLUE CROSS/BLUE SHIELD | Admitting: Cardiothoracic Surgery

## 2017-10-12 VITALS — BP 178/92 | HR 82 | Temp 98.3°F | Resp 18 | Ht 61.0 in | Wt 99.6 lb

## 2017-10-12 DIAGNOSIS — R911 Solitary pulmonary nodule: Secondary | ICD-10-CM

## 2017-10-12 DIAGNOSIS — J449 Chronic obstructive pulmonary disease, unspecified: Secondary | ICD-10-CM | POA: Diagnosis not present

## 2017-10-12 DIAGNOSIS — R918 Other nonspecific abnormal finding of lung field: Secondary | ICD-10-CM | POA: Diagnosis not present

## 2017-10-12 NOTE — Telephone Encounter (Signed)
Left message for Claudia Chavez to return call to schedule the below.  PFT's and CT guided lung biopsy

## 2017-10-12 NOTE — Progress Notes (Signed)
Patient ID: Claudia Chavez, female   DOB: [birth year], 62 y.o.   MRN: [id number]  Chief Complaint  Patient presents with  . New Patient (Initial Visit)    Right Lung nodule    Referred By Dr. Fabienne Bruns Reason for Referral right upper lobe mass  HPI Location, Quality, Duration, Severity, Timing, Context, Modifying Factors, Associated Signs and Symptoms.  Claudia Chavez is a 62 y.o. female.  Approximately 3 years ago she underwent a right lower lobectomy after induction chemotherapy.  Postoperatively she was treated with radiation therapy and chemotherapy for presumed stage IIIa carcinoma of the lung.  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  She did receive standard dose radiation therapy.  She has been followed by Dr. Fabienne Bruns in the oncology department for her right lower lobe mass.  Recently she was found to have a enlarging right upper lobe mass.  It now measures about 8 mm.  A biopsy has not been done although a PET scan was performed.  This did reveal some uptake in the right upper lobe nodule which measured about a centimeter in size and there is no evidence of distant disease.  She is sent here for consideration of additional surgical options.  The patient does continue to smoke.  She smokes since her surgical procedure.  She does get short of breath with moderate activity.  She has radiographic evidence of COPD.  She does not have a cough or hemoptysis.  She has had no weight loss.   Past Medical History:  Diagnosis Date  . Cancer of lower lobe of right lung ([place]) 05/07/2015  . COPD (chronic obstructive pulmonary disease) ([place])   . Non-small cell lung cancer ([place])   . Pneumonia 04/2015    Past Surgical History:  Procedure Laterality Date  . ABDOMINAL HYSTERECTOMY    . ELBOW SURGERY Right 1995  . PORTACATH PLACEMENT Right 05/10/2015   Procedure: INSERTION PORT-A-CATH;  Surgeon: Nestor Lewandowsky, MD;  Location: [facility] ORS;   Service: General;  Laterality: Right;  Marland Kitchen VIDEO ASSISTED THORACOSCOPY (VATS)/THOROCOTOMY Right 08/18/2015   Procedure: VIDEO ASSISTED THORACOSCOPY (VATS)/THOROCOTOMY;  Surgeon: Nestor Lewandowsky, MD;  Location: [facility] ORS;  Service: General;  Laterality: Right;    Family History  Problem Relation Age of Onset  . Stroke Mother   . Lung cancer Father 3    Social History Social History   Tobacco Use  . Smoking status: Current Every Day Smoker    Packs/day: 0.25    Years: 40.00    Pack years: 10.00    Types: Cigarettes  . Smokeless tobacco: Never Used  . Tobacco comment: "1 pk or less per day"  Substance Use Topics  . Alcohol use: Yes    Alcohol/week: 1.2 - 2.4 oz    Types: 2 - 4 Cans of beer per week    Comment: beer-occasionally  . Drug use: No    Allergies  Allergen Reactions  . Lyrica [Pregabalin] Other (See Comments)    Made patient feel very dizzy and not feel good.     Current Outpatient Medications  Medication Sig Dispense Refill  . Fluticasone-Salmeterol (ADVAIR DISKUS) 250-50 MCG/DOSE AEPB INHALE 1 INHALATION INTO THE LUNGS EVERY 12 (TWELVE) HOURS.     No current facility-administered medications for this visit.    Facility-Administered Medications Ordered in Other Visits  Medication Dose Route Frequency Provider Last Rate Last Dose  . heparin lock flush 100 unit/mL  500 Units Intracatheter Once PRN  Forest Gleason, MD      . heparin lock flush 100 unit/mL  500 Units Intravenous Once Charlaine Dalton R, MD      . sodium chloride flush (NS) 0.9 % injection 10 mL  10 mL Intravenous PRN Cammie Sickle, MD          Review of Systems A complete review of systems was asked and was negative except for the following positive findings shortness of breath, wheezing, easy bruising.  Blood pressure (!) 178/92, pulse 82, temperature 98.3 F (36.8 C), temperature source Oral, resp. rate 18, height 5\' 1"  (1.549 m), weight 99 lb 9.6 oz (45.2 kg), SpO2 100 %.  Physical  Exam CONSTITUTIONAL:  Pleasant, well-developed, well-nourished, and in no acute distress. EYES: Pupils equal and reactive to light, Sclera non-icteric EARS, NOSE, MOUTH AND THROAT:  The oropharynx was clear.  Dentition is good repair.  Oral mucosa pink and moist. LYMPH NODES:  Lymph nodes in the neck and axillae were normal RESPIRATORY:  Lungs were clear but quite distant..  Normal respiratory effort without pathologic use of accessory muscles of respiration CARDIOVASCULAR: Heart was regular without murmurs.  There were no carotid bruits. GI: The abdomen was soft, nontender, and nondistended. There were no palpable masses. There was no hepatosplenomegaly. There were normal bowel sounds in all quadrants. GU:  Rectal deferred.   MUSCULOSKELETAL:  Normal muscle strength and tone.  No clubbing any other type of sublobar resection.  Given the cyanosis.   SKIN:  There were no pathologic skin lesions.  There were no nodules on palpation. NEUROLOGIC:  Sensation is normal.  Cranial nerves are grossly intact. PSYCH:  Oriented to person, place and time.  Mood and affect are normal.  Data Reviewed CT scan and PET scan  I have personally reviewed the patient's imaging, laboratory findings and medical records.    Assessment    I have independently reviewed the patient's CT scan and PET scan.  There is an enlarging right upper lobe nodule which is certainly suspicious for a malignancy.    Plan    I had a long discussion with the patient regarding the options.  Since she has had a prior right lower lobectomy she would now need a right upper lobectomy.  The nodule centrally located and is not amenable to wedge resection or other sublobar resection.  Given the fact that she has had prior radiation therapy to the chest this makes a pneumonectomy possible for treatment.  I discussed with her the options of performing a pneumonectomy if it were to come to that and she is not interested in pursuing additional  surgical procedures.  I then reviewed with her the option of perhaps considering stereotactic radiotherapy or radiofrequency ablation.  She appears to be more interested in 1 of those approaches for her presumed lung cancer.  She has not had a biopsy done and she has not had pulmonary function studies.  I would like to obtain both of those and also make referral to Dr. Berton Mount who seen the patient.  If she is not a candidate for radiation therapy she may be a candidate for radiofrequency ablation or other ablative procedures.       Nestor Lewandowsky, MD 10/12/2017, 9:22 AM

## 2017-10-12 NOTE — Telephone Encounter (Signed)
Patient notified of appointment listed below.  PFT's 10/16/17 @ 7:15 am. No hailers, caffeine.  [facility] Mall registration.

## 2017-10-12 NOTE — Patient Instructions (Signed)
As discussed in the office Dr.Oaks would like for you to have the following test done.  Pulmonary Function Test CT Guided lung biopsy We will send the referral to Dr.Crystal's ( Radiation Oncology) office as well.   I will call you later today with the appointment date and time.  If you have not heard from me by Monday afternoon please call the office @ [phone].

## 2017-10-15 ENCOUNTER — Telehealth: Payer: Self-pay

## 2017-10-15 NOTE — Telephone Encounter (Signed)
Spoke with Pamala Hurry at this time-Radiologist has deferred CT guided biopsy at this time. Please consider EMB bronchoscopy first.   Left message for Dr.Oaks to return call to office.

## 2017-10-15 NOTE — Telephone Encounter (Signed)
Spoke with patient at this time regardingCT guided lung biopsy.  Radiologist is recommending EMB Bronchoscopy.  Spoke with DR.Oaks and per Dr.Oaks EMB bronchoscopy. I let patient know referral has been sent and someone from dr.Kasa's office should call by the end of the week to schedule an appointment, if not please call me back so I can check on this.   Patient will need follow up appointment with Dr.Oaks after bronchoscopy.  Patient verbalized understanding.

## 2017-10-16 ENCOUNTER — Telehealth: Payer: Self-pay

## 2017-10-16 ENCOUNTER — Other Ambulatory Visit: Payer: Self-pay | Admitting: Cardiothoracic Surgery

## 2017-10-16 ENCOUNTER — Ambulatory Visit: Payer: BLUE CROSS/BLUE SHIELD | Attending: Cardiothoracic Surgery

## 2017-10-16 DIAGNOSIS — R911 Solitary pulmonary nodule: Secondary | ICD-10-CM | POA: Diagnosis not present

## 2017-10-16 DIAGNOSIS — R918 Other nonspecific abnormal finding of lung field: Secondary | ICD-10-CM | POA: Diagnosis not present

## 2017-10-16 DIAGNOSIS — J449 Chronic obstructive pulmonary disease, unspecified: Secondary | ICD-10-CM | POA: Insufficient documentation

## 2017-10-16 NOTE — Telephone Encounter (Signed)
Dr.Kasa 10/18/17 @ 9:30 am  Patient notified of the above appointment.

## 2017-10-18 ENCOUNTER — Ambulatory Visit: Payer: BLUE CROSS/BLUE SHIELD | Admitting: Internal Medicine

## 2017-10-18 ENCOUNTER — Other Ambulatory Visit: Payer: Self-pay | Admitting: *Deleted

## 2017-10-18 ENCOUNTER — Encounter: Payer: Self-pay | Admitting: Internal Medicine

## 2017-10-18 VITALS — BP 152/90 | HR 106 | Ht 61.0 in | Wt 100.0 lb

## 2017-10-18 DIAGNOSIS — R9389 Abnormal findings on diagnostic imaging of other specified body structures: Secondary | ICD-10-CM | POA: Diagnosis not present

## 2017-10-18 DIAGNOSIS — R918 Other nonspecific abnormal finding of lung field: Secondary | ICD-10-CM

## 2017-10-18 NOTE — Progress Notes (Signed)
Super D CT ordered for ENB per DK.

## 2017-10-18 NOTE — Patient Instructions (Signed)
  The Risks and Benefits of the Bronchoscopy procedure with ENB were explained to patient/family.  I have discussed the risk for Acute Bleeding, increased chance of Infection, increased chance of Respiratory Failure and Cardiac Arrest, Stroke and Death.  I have also explained to avoid all types of NSAIDs for 1 week  to decrease chance of bleeding, and to avoid food and drinks the midnight prior to procedure.  The procedure consists of a video camera with a light source to be placed and inserted  into the lungs to  look for abnormal tissue and to obtain tissue samples by using needle and biopsy tools.  The patient/family understand the risks and benefits and have agreed to proceed with procedure.

## 2017-10-18 NOTE — Progress Notes (Signed)
Name: Claudia Chavez MRN: [id number] DOB: 01-09-1956     CONSULTATION DATE: .1.17.19  REFERRING MD :  OAKS CHIEF COMPLAINT: abnormal CT chest  STUDIES:  CT chest 1.7.19 I have Independently reviewed images of  CT chest   on 10/18/2017 Interpretation:Right upper lobe nodule is grossly stable in size 05/25/2017 but appears more solid in appearance than on 05/25/2017 and is new from 08/07/2016. Findings are worrisome for bronchogenic carcinoma.    HISTORY OF PRESENT ILLNESS:   62 y.o. female.  Approximately 3 years ago she underwent a right lower lobectomy after induction chemotherapy.  Postoperatively she was treated with radiation therapy and chemotherapy for presumed stage IIIa SQ cell carcinoma of the lung.  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  She did receive standard dose radiation therapy.  She has been followed by Dr. Fabienne Bruns in the oncology department for her right lower lobe mass.  Recently she was found to have a enlarging right upper lobe mass.  It now measures about 8 mm.  A biopsy has not been done although a PET scan was performed.  This did reveal some uptake in the right upper lobe nodule which measured about a centimeter in size and there is no evidence of distant disease.  She is sent here for consideration of additional surgical options.   The patient does continue to smoke.   She smokes since her surgical procedure.   She does get short of breath with moderate activity.   She has radiographic evidence of COPD.   She does not have a cough or hemoptysis.   She has had no weight loss.  PFT/s c/w severe COPD Fev1 55%   The Risks and Benefits of the Bronchoscopy procedure with ENB were explained to patient/family.  I have discussed the risk for Acute Bleeding, increased chance of Infection, increased chance of Respiratory Failure and Cardiac Arrest, Stroke and Death.  I have also explained to  avoid all types of NSAIDs for 1 week to decrease chance of bleeding, and to avoid food and drinks the midnight prior to procedure.  The procedure consists of a video camera with a light source to be placed and inserted  into the lungs to  look for abnormal tissue and to obtain tissue samples by using needle and biopsy tools.  The patient/family understand the risks and benefits and have agreed to proceed with procedure.   PAST MEDICAL HISTORY :   has a past medical history of Cancer of lower lobe of right lung ([place]) (05/07/2015), COPD (chronic obstructive pulmonary disease) ([place]), Non-small cell lung cancer ([place]), and Pneumonia (04/2015).  has a past surgical history that includes Abdominal hysterectomy; Portacath placement (Right, 05/10/2015); Elbow surgery (Right, 1995); and Video assisted thoracoscopy (vats)/thorocotomy (Right, 08/18/2015). Prior to Admission medications   Medication Sig Start Date End Date Taking? Authorizing Provider  Fluticasone-Salmeterol (ADVAIR DISKUS) 250-50 MCG/DOSE AEPB INHALE 1 INHALATION INTO THE LUNGS EVERY 12 (TWELVE) HOURS. 09/05/17  Yes [provider]   Allergies  Allergen Reactions  . Lyrica [Pregabalin] Other (See Comments)    Made patient feel very dizzy and not feel good.     FAMILY HISTORY:  family history includes Lung cancer (age of onset: 75) in her father; Stroke in her mother. SOCIAL HISTORY:  reports that she has been smoking cigarettes.  She has a 20.00 pack-year smoking history. she has never used smokeless tobacco. She reports that she drinks about 1.2 -  2.4 oz of alcohol per week. She reports that she does not use drugs.  REVIEW OF SYSTEMS:   Constitutional: Negative for fever, chills, weight loss, malaise/fatigue and diaphoresis.  HENT: Negative for hearing loss, ear pain, nosebleeds, congestion, sore throat, neck pain, tinnitus and ear discharge.   Eyes: Negative for blurred vision, double vision, photophobia, pain, discharge and  redness.  Respiratory: Negative for cough, hemoptysis, sputum production, shortness of breath, wheezing and stridor.   Cardiovascular: Negative for chest pain, palpitations, orthopnea, claudication, leg swelling and PND.  Gastrointestinal: Negative for heartburn, nausea, vomiting, abdominal pain, diarrhea, constipation, blood in stool and melena.  Genitourinary: Negative for dysuria, urgency, frequency, hematuria and flank pain.  Musculoskeletal: Negative for myalgias, back pain, joint pain and falls.  Skin: Negative for itching and rash.  Neurological: Negative for dizziness, tingling, tremors, sensory change, speech change, focal weakness, seizures, loss of consciousness, weakness and headaches.  Endo/Heme/Allergies: Negative for environmental allergies and polydipsia. Does not bruise/bleed easily.  ALL OTHER ROS ARE NEGATIVE    VITAL SIGNS: BP (!) 152/90 (BP Location: Left Arm, Cuff Size: Normal)   Pulse (!) 106   Ht 5\' 1"  (1.549 m)   Wt 100 lb (45.4 kg)   SpO2 98%   BMI 18.89 kg/m    Physical Examination:   GENERAL:NAD, no fevers, chills, no weakness no fatigue HEAD: Normocephalic, atraumatic.  EYES: Pupils equal, round, reactive to light. Extraocular muscles intact. No scleral icterus.  MOUTH: Moist mucosal membrane.   EAR, NOSE, THROAT: Clear without exudates. No external lesions.  NECK: Supple. No thyromegaly. No nodules. No JVD.  PULMONARY:CTA B/L no wheezes, no crackles, no rhonchi CARDIOVASCULAR: S1 and S2. Regular rate and rhythm. No murmurs, rubs, or gallops. No edema.  GASTROINTESTINAL: Soft, nontender, nondistended. No masses. Positive bowel sounds.  MUSCULOSKELETAL: No swelling, clubbing, or edema. Range of motion full in all extremities.  NEUROLOGIC: Cranial nerves II through XII are intact. No gross focal neurological deficits.  SKIN: No ulceration, lesions, rashes, or cyanosis. Skin warm and dry. Turgor intact.  PSYCHIATRIC: Mood, affect within normal limits.  The patient is awake, alert and oriented x 3. Insight, judgment intact.      ASSESSMENT / PLAN: 62 yo pleasant WF with Gold Stage B with Severe COPD with prior history of Lung cancer with New R lung m,ass/nodule that is worrisome for recurrent malignancy or primary lung cancer  CT scan and Risks explained to patient and family  Will plan for ENB Smoking cessation strongly advised Continue advair as prescribed   Patient/Family are satisfied with Plan of action and management. All questions answered  Corrin Parker, M.D.  [facility]  Medical Director [place] Director [facility] Cardio-Pulmonary Department

## 2017-10-18 NOTE — H&P (View-Only) (Signed)
Name: Claudia Chavez MRN: [id number] DOB: 1956/07/12     CONSULTATION DATE: .1.17.19  REFERRING MD :  OAKS CHIEF COMPLAINT: abnormal CT chest  STUDIES:  CT chest 1.7.19 I have Independently reviewed images of  CT chest   on 10/18/2017 Interpretation:Right upper lobe nodule is grossly stable in size 05/25/2017 but appears more solid in appearance than on 05/25/2017 and is new from 08/07/2016. Findings are worrisome for bronchogenic carcinoma.    HISTORY OF PRESENT ILLNESS:   62 y.o. female.  Approximately 3 years ago she underwent a right lower lobectomy after induction chemotherapy.  Postoperatively she was treated with radiation therapy and chemotherapy for presumed stage IIIa SQ cell carcinoma of the lung.  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  She did receive standard dose radiation therapy.  She has been followed by Dr. Fabienne Bruns in the oncology department for her right lower lobe mass.  Recently she was found to have a enlarging right upper lobe mass.  It now measures about 8 mm.  A biopsy has not been done although a PET scan was performed.  This did reveal some uptake in the right upper lobe nodule which measured about a centimeter in size and there is no evidence of distant disease.  She is sent here for consideration of additional surgical options.   The patient does continue to smoke.   She smokes since her surgical procedure.   She does get short of breath with moderate activity.   She has radiographic evidence of COPD.   She does not have a cough or hemoptysis.   She has had no weight loss.  PFT/s c/w severe COPD Fev1 55%   The Risks and Benefits of the Bronchoscopy procedure with ENB were explained to patient/family.  I have discussed the risk for Acute Bleeding, increased chance of Infection, increased chance of Respiratory Failure and Cardiac Arrest, Stroke and Death.  I have also explained to  avoid all types of NSAIDs for 1 week to decrease chance of bleeding, and to avoid food and drinks the midnight prior to procedure.  The procedure consists of a video camera with a light source to be placed and inserted  into the lungs to  look for abnormal tissue and to obtain tissue samples by using needle and biopsy tools.  The patient/family understand the risks and benefits and have agreed to proceed with procedure.   PAST MEDICAL HISTORY :   has a past medical history of Cancer of lower lobe of right lung ([place]) (05/07/2015), COPD (chronic obstructive pulmonary disease) ([place]), Non-small cell lung cancer ([place]), and Pneumonia (04/2015).  has a past surgical history that includes Abdominal hysterectomy; Portacath placement (Right, 05/10/2015); Elbow surgery (Right, 1995); and Video assisted thoracoscopy (vats)/thorocotomy (Right, 08/18/2015). Prior to Admission medications   Medication Sig Start Date End Date Taking? Authorizing Provider  Fluticasone-Salmeterol (ADVAIR DISKUS) 250-50 MCG/DOSE AEPB INHALE 1 INHALATION INTO THE LUNGS EVERY 12 (TWELVE) HOURS. 09/05/17  Yes [provider]   Allergies  Allergen Reactions  . Lyrica [Pregabalin] Other (See Comments)    Made patient feel very dizzy and not feel good.     FAMILY HISTORY:  family history includes Lung cancer (age of onset: 68) in her father; Stroke in her mother. SOCIAL HISTORY:  reports that she has been smoking cigarettes.  She has a 20.00 pack-year smoking history. she has never used smokeless tobacco. She reports that she drinks about 1.2 -  2.4 oz of alcohol per week. She reports that she does not use drugs.  REVIEW OF SYSTEMS:   Constitutional: Negative for fever, chills, weight loss, malaise/fatigue and diaphoresis.  HENT: Negative for hearing loss, ear pain, nosebleeds, congestion, sore throat, neck pain, tinnitus and ear discharge.   Eyes: Negative for blurred vision, double vision, photophobia, pain, discharge and  redness.  Respiratory: Negative for cough, hemoptysis, sputum production, shortness of breath, wheezing and stridor.   Cardiovascular: Negative for chest pain, palpitations, orthopnea, claudication, leg swelling and PND.  Gastrointestinal: Negative for heartburn, nausea, vomiting, abdominal pain, diarrhea, constipation, blood in stool and melena.  Genitourinary: Negative for dysuria, urgency, frequency, hematuria and flank pain.  Musculoskeletal: Negative for myalgias, back pain, joint pain and falls.  Skin: Negative for itching and rash.  Neurological: Negative for dizziness, tingling, tremors, sensory change, speech change, focal weakness, seizures, loss of consciousness, weakness and headaches.  Endo/Heme/Allergies: Negative for environmental allergies and polydipsia. Does not bruise/bleed easily.  ALL OTHER ROS ARE NEGATIVE    VITAL SIGNS: BP (!) 152/90 (BP Location: Left Arm, Cuff Size: Normal)   Pulse (!) 106   Ht 5\' 1"  (1.549 m)   Wt 100 lb (45.4 kg)   SpO2 98%   BMI 18.89 kg/m    Physical Examination:   GENERAL:NAD, no fevers, chills, no weakness no fatigue HEAD: Normocephalic, atraumatic.  EYES: Pupils equal, round, reactive to light. Extraocular muscles intact. No scleral icterus.  MOUTH: Moist mucosal membrane.   EAR, NOSE, THROAT: Clear without exudates. No external lesions.  NECK: Supple. No thyromegaly. No nodules. No JVD.  PULMONARY:CTA B/L no wheezes, no crackles, no rhonchi CARDIOVASCULAR: S1 and S2. Regular rate and rhythm. No murmurs, rubs, or gallops. No edema.  GASTROINTESTINAL: Soft, nontender, nondistended. No masses. Positive bowel sounds.  MUSCULOSKELETAL: No swelling, clubbing, or edema. Range of motion full in all extremities.  NEUROLOGIC: Cranial nerves II through XII are intact. No gross focal neurological deficits.  SKIN: No ulceration, lesions, rashes, or cyanosis. Skin warm and dry. Turgor intact.  PSYCHIATRIC: Mood, affect within normal limits.  The patient is awake, alert and oriented x 3. Insight, judgment intact.      ASSESSMENT / PLAN: 62 yo pleasant WF with Gold Stage B with Severe COPD with prior history of Lung cancer with New R lung m,ass/nodule that is worrisome for recurrent malignancy or primary lung cancer  CT scan and Risks explained to patient and family  Will plan for ENB Smoking cessation strongly advised Continue advair as prescribed   Patient/Family are satisfied with Plan of action and management. All questions answered  Corrin Parker, M.D.  [facility]  Medical Director [place] Director [facility] Cardio-Pulmonary Department

## 2017-10-19 ENCOUNTER — Telehealth: Payer: Self-pay | Admitting: *Deleted

## 2017-10-19 NOTE — Telephone Encounter (Signed)
Pt informed of dates and times for PAT appt and bronch procedure. Also informed she needs to be here at 11am for Super D CT scan and to get upstairs to same day.   Procedure: ENB Provider: Mortimer Fries Date: 10/25/17 DX: Lung Mass

## 2017-10-22 ENCOUNTER — Ambulatory Visit
Admission: RE | Admit: 2017-10-22 | Discharge: 2017-10-22 | Disposition: A | Discharge status: Home / Self Care | Payer: BLUE CROSS/BLUE SHIELD | Source: Ambulatory Visit | Attending: Radiation Oncology | Admitting: Radiation Oncology

## 2017-10-22 ENCOUNTER — Other Ambulatory Visit: Payer: Self-pay

## 2017-10-22 ENCOUNTER — Encounter
Admission: RE | Admit: 2017-10-22 | Discharge: 2017-10-22 | Disposition: A | Discharge status: Home / Self Care | Payer: BLUE CROSS/BLUE SHIELD | Source: Ambulatory Visit | Attending: Internal Medicine | Admitting: Internal Medicine

## 2017-10-22 ENCOUNTER — Encounter: Payer: Self-pay | Admitting: Radiation Oncology

## 2017-10-22 VITALS — BP 165/86 | HR 92 | Temp 94.7°F | Resp 20 | Wt 102.2 lb

## 2017-10-22 DIAGNOSIS — C3431 Malignant neoplasm of lower lobe, right bronchus or lung: Secondary | ICD-10-CM

## 2017-10-22 DIAGNOSIS — Z510 Encounter for antineoplastic radiation therapy: Secondary | ICD-10-CM | POA: Diagnosis not present

## 2017-10-22 DIAGNOSIS — Z923 Personal history of irradiation: Secondary | ICD-10-CM | POA: Insufficient documentation

## 2017-10-22 DIAGNOSIS — Z85118 Personal history of other malignant neoplasm of bronchus and lung: Secondary | ICD-10-CM | POA: Diagnosis not present

## 2017-10-22 DIAGNOSIS — Z9221 Personal history of antineoplastic chemotherapy: Secondary | ICD-10-CM | POA: Insufficient documentation

## 2017-10-22 DIAGNOSIS — Z01810 Encounter for preprocedural cardiovascular examination: Secondary | ICD-10-CM | POA: Diagnosis not present

## 2017-10-22 DIAGNOSIS — G629 Polyneuropathy, unspecified: Secondary | ICD-10-CM | POA: Diagnosis not present

## 2017-10-22 DIAGNOSIS — Z888 Allergy status to other drugs, medicaments and biological substances status: Secondary | ICD-10-CM | POA: Diagnosis not present

## 2017-10-22 DIAGNOSIS — R918 Other nonspecific abnormal finding of lung field: Secondary | ICD-10-CM | POA: Diagnosis present

## 2017-10-22 DIAGNOSIS — Z9889 Other specified postprocedural states: Secondary | ICD-10-CM | POA: Diagnosis not present

## 2017-10-22 DIAGNOSIS — R911 Solitary pulmonary nodule: Secondary | ICD-10-CM | POA: Diagnosis present

## 2017-10-22 DIAGNOSIS — Z7951 Long term (current) use of inhaled steroids: Secondary | ICD-10-CM | POA: Diagnosis not present

## 2017-10-22 DIAGNOSIS — Z823 Family history of stroke: Secondary | ICD-10-CM | POA: Diagnosis not present

## 2017-10-22 DIAGNOSIS — F17210 Nicotine dependence, cigarettes, uncomplicated: Secondary | ICD-10-CM | POA: Insufficient documentation

## 2017-10-22 DIAGNOSIS — J439 Emphysema, unspecified: Secondary | ICD-10-CM | POA: Diagnosis not present

## 2017-10-22 DIAGNOSIS — Z801 Family history of malignant neoplasm of trachea, bronchus and lung: Secondary | ICD-10-CM | POA: Diagnosis not present

## 2017-10-22 DIAGNOSIS — Z902 Acquired absence of lung [part of]: Secondary | ICD-10-CM | POA: Diagnosis not present

## 2017-10-22 LAB — POTASSIUM: Potassium: 3.5 mmol/L (ref 3.5–5.1)

## 2017-10-22 NOTE — Progress Notes (Signed)
Radiation Oncology Follow up Note (OP N/A) old patient new area  Name: Claudia Chavez   Date:   10/22/2017 MRN:  [id number] DOB: [birth year]    This 62 y.o. female presents to the clinic today for new right upper lobe lesion probable non-small cell lung cancer stage I inpatient previously treated for locally advanced lung cancer of the right lower lobe.  REFERRING PROVIDER: Maryland Pink, MD  HPI: Patient is a 62 year old female now out over 20 months having completed combined modality treatment with chemotherapy and radiation for stage IIIa (T3 N2 M0), cell carcinoma the right lower lobe. She has been doing well.. She initially had a right lower lobectomy after induction chemotherapy and underwent chemoradiation therapy. She has been doing well although recently noted to have a right upper lobe mass which now measures 8 mm and is scheduled for attempted ultrasound-guided biopsy by pulmonology later this week. The area has not significantly increased in size although has gotten more solid in appearance. PET CT scan shows an SUV of 1.9 and given the small size of the nodule and level of metabolic activity this is worrisome for malignancy. She is now referred ration oncology for opinion. She specifically denies cough hemoptysis or chest tightness.  COMPLICATIONS OF TREATMENT: none  FOLLOW UP COMPLIANCE: keeps appointments   PHYSICAL EXAM:  BP (!) 165/86   Pulse 92   Temp (!) 94.7 F (34.8 C)   Resp 20   Wt 102 lb 2.9 oz (46.3 kg)   BMI 19.31 kg/m  Well-developed well-nourished patient in NAD. HEENT reveals PERLA, EOMI, discs not visualized.  Oral cavity is clear. No oral mucosal lesions are identified. Neck is clear without evidence of cervical or supraclavicular adenopathy. Lungs are clear to A&P. Cardiac examination is essentially unremarkable with regular rate and rhythm without murmur rub or thrill. Abdomen is benign with no organomegaly or masses noted. Motor sensory and DTR levels are  equal and symmetric in the upper and lower extremities. Cranial nerves II through XII are grossly intact. Proprioception is intact. No peripheral adenopathy or edema is identified. No motor or sensory levels are noted. Crude visual fields are within normal range.  RADIOLOGY RESULTS: PET CT scan and CT scans reviewed  PLAN: Present time patient will have attempted biopsy by pulmonology later this week. Even if this is negative I would favor treating as a non-small cell lung cancer stage I. I would plan on delivering SB RT 5000 cGy in 5 fractions. I would use 4 D  treatment planning and have tentatively set up and ordered CT simulation for next week. Risks and benefits of treatment including possible develop of cough fatigue alteration of blood counts and slight chance of skin reaction all were discussed with the patient. She seems to comprehend my treatment plan well.  I would like to take this opportunity to thank you for allowing me to participate in the care of your patient.Noreene Filbert, MD

## 2017-10-22 NOTE — Patient Instructions (Signed)
Your procedure is scheduled on: Thurs, 10/25/17 Report to Day Surgery. CT at 11:00  Remember: Instructions that are not followed completely may result in serious medical risk, up to and including death, or upon the discretion of your surgeon and anesthesiologist your surgery may need to be rescheduled.     _X__ 1. Do not eat food after midnight the night before your procedure.                 No gum chewing or hard candies. You may drink clear liquids up to 2 hours                 before you are scheduled to arrive for your surgery- DO not drink clear                 liquids within 2 hours of the start of your surgery.                 Clear Liquids include:  water, apple juice without pulp, clear carbohydrate                 drink such as Clearfast of Gartorade, Black Coffee or Tea (Do not add                 anything to coffee or tea).     _X__ 2.  No Alcohol for 24 hours before or after surgery.   _X__ 3.  Do Not Smoke or use e-cigarettes For 24 Hours Prior to Your Surgery.                 Do not use any chewable tobacco products for at least 6 hours prior to                 surgery.  ____  4.  Bring all medications with you on the day of surgery if instructed.   _x___  5.  Notify your doctor if there is any change in your medical condition      (cold, fever, infections).     Do not wear jewelry, make-up, hairpins, clips or nail polish. Do not wear lotions, powders, or perfumes. You may wear deodorant. Do not shave 48 hours prior to surgery. Men may shave face and neck. Do not bring valuables to the hospital.    [facility] is not responsible for any belongings or valuables.  Contacts, dentures or bridgework may not be worn into surgery. Leave your suitcase in the car. After surgery it may be brought to your room. For patients admitted to the hospital, discharge time is determined by your treatment team.   Patients discharged the day of surgery will not be  allowed to drive home.   Please read over the following fact sheets that you were given:    ____ Take these medicines the morning of surgery with A SIP OF WATER:    1. NONE  2.   3.   4.  5.  6.  ____ Fleet Enema (as directed)   ____ Use CHG Soap as directed  __x__ Use inhalers on the day of surgeryFluticasone-Salmeterol (ADVAIR DISKUS) 250-50 MCG/DOSE AEPB  ____ Stop metformin 2 days prior to surgery    ____ Take 1/2 of usual insulin dose the night before surgery. No insulin the morning          of surgery.   ____ Stop Coumadin/Plavix/aspirin on   ____ Stop Anti-inflammatories on    ____ Stop supplements until after  surgery.    ____ Bring C-Pap to the hospital.

## 2017-10-23 ENCOUNTER — Telehealth: Payer: Self-pay | Admitting: Internal Medicine

## 2017-10-23 NOTE — Telephone Encounter (Signed)
Claudia Chavez is calling for pre authorization on the electromagnetic bronch procedure scheduled on Thursday Please contact

## 2017-10-23 NOTE — Pre-Procedure Instructions (Signed)
EKG OK BY DR A Rosey Bath

## 2017-10-24 NOTE — Telephone Encounter (Signed)
Records faxed to [place] enb case# given to [facility] at pre service center Joellen [place]

## 2017-10-25 ENCOUNTER — Ambulatory Visit
Admission: RE | Admit: 2017-10-25 | Discharge: 2017-10-25 | Disposition: A | Discharge status: Home / Self Care | Payer: BLUE CROSS/BLUE SHIELD | Source: Ambulatory Visit | Attending: Internal Medicine | Admitting: Internal Medicine

## 2017-10-25 ENCOUNTER — Ambulatory Visit: Payer: BLUE CROSS/BLUE SHIELD | Admitting: Anesthesiology

## 2017-10-25 ENCOUNTER — Ambulatory Visit: Payer: BLUE CROSS/BLUE SHIELD

## 2017-10-25 ENCOUNTER — Other Ambulatory Visit: Payer: Self-pay

## 2017-10-25 ENCOUNTER — Encounter: Payer: Self-pay | Admitting: *Deleted

## 2017-10-25 ENCOUNTER — Encounter
Admission: RE | Disposition: A | Discharge status: Home / Self Care | Payer: Self-pay | Source: Ambulatory Visit | Attending: Internal Medicine

## 2017-10-25 DIAGNOSIS — J439 Emphysema, unspecified: Secondary | ICD-10-CM | POA: Insufficient documentation

## 2017-10-25 DIAGNOSIS — Z9221 Personal history of antineoplastic chemotherapy: Secondary | ICD-10-CM | POA: Insufficient documentation

## 2017-10-25 DIAGNOSIS — G629 Polyneuropathy, unspecified: Secondary | ICD-10-CM | POA: Insufficient documentation

## 2017-10-25 DIAGNOSIS — R911 Solitary pulmonary nodule: Secondary | ICD-10-CM

## 2017-10-25 DIAGNOSIS — Z923 Personal history of irradiation: Secondary | ICD-10-CM | POA: Insufficient documentation

## 2017-10-25 DIAGNOSIS — Z7951 Long term (current) use of inhaled steroids: Secondary | ICD-10-CM | POA: Insufficient documentation

## 2017-10-25 DIAGNOSIS — Z823 Family history of stroke: Secondary | ICD-10-CM | POA: Insufficient documentation

## 2017-10-25 DIAGNOSIS — Z888 Allergy status to other drugs, medicaments and biological substances status: Secondary | ICD-10-CM | POA: Insufficient documentation

## 2017-10-25 DIAGNOSIS — Z801 Family history of malignant neoplasm of trachea, bronchus and lung: Secondary | ICD-10-CM | POA: Insufficient documentation

## 2017-10-25 DIAGNOSIS — Z9889 Other specified postprocedural states: Secondary | ICD-10-CM | POA: Insufficient documentation

## 2017-10-25 DIAGNOSIS — Z902 Acquired absence of lung [part of]: Secondary | ICD-10-CM | POA: Insufficient documentation

## 2017-10-25 DIAGNOSIS — R918 Other nonspecific abnormal finding of lung field: Secondary | ICD-10-CM

## 2017-10-25 DIAGNOSIS — Z85118 Personal history of other malignant neoplasm of bronchus and lung: Secondary | ICD-10-CM | POA: Insufficient documentation

## 2017-10-25 DIAGNOSIS — F17210 Nicotine dependence, cigarettes, uncomplicated: Secondary | ICD-10-CM | POA: Insufficient documentation

## 2017-10-25 HISTORY — PX: ELECTROMAGNETIC NAVIGATION BROCHOSCOPY: SHX5369

## 2017-10-25 SURGERY — ELECTROMAGNETIC NAVIGATION BRONCHOSCOPY
Anesthesia: General

## 2017-10-25 MED ORDER — MIDAZOLAM HCL 2 MG/2ML IJ SOLN
INTRAMUSCULAR | Status: AC
  Filled 2017-10-25: qty 2

## 2017-10-25 MED ORDER — DEXAMETHASONE SODIUM PHOSPHATE 10 MG/ML IJ SOLN
INTRAMUSCULAR | Status: DC | PRN
  Administered 2017-10-25: 10 mg via INTRAVENOUS

## 2017-10-25 MED ORDER — LACTATED RINGERS IV SOLN
INTRAVENOUS | Status: DC
  Administered 2017-10-25: 12:00:00 via INTRAVENOUS

## 2017-10-25 MED ORDER — PROPOFOL 10 MG/ML IV BOLUS
INTRAVENOUS | Status: AC
  Filled 2017-10-25: qty 20

## 2017-10-25 MED ORDER — ONDANSETRON HCL 4 MG/2ML IJ SOLN
INTRAMUSCULAR | Status: DC | PRN
  Administered 2017-10-25: 4 mg via INTRAVENOUS

## 2017-10-25 MED ORDER — FENTANYL CITRATE (PF) 100 MCG/2ML IJ SOLN
INTRAMUSCULAR | Status: AC
  Filled 2017-10-25: qty 2

## 2017-10-25 MED ORDER — PROPOFOL 10 MG/ML IV BOLUS
INTRAVENOUS | Status: DC | PRN
  Administered 2017-10-25: 80 mg via INTRAVENOUS

## 2017-10-25 MED ORDER — FAMOTIDINE 20 MG PO TABS
ORAL_TABLET | ORAL | Status: AC
  Filled 2017-10-25: qty 1

## 2017-10-25 MED ORDER — SUGAMMADEX SODIUM 200 MG/2ML IV SOLN
INTRAVENOUS | Status: DC | PRN
  Administered 2017-10-25: 200 mg via INTRAVENOUS

## 2017-10-25 MED ORDER — FAMOTIDINE 20 MG PO TABS
20.0000 mg | ORAL_TABLET | Freq: Once | ORAL | Status: AC
  Administered 2017-10-25: 20 mg via ORAL

## 2017-10-25 MED ORDER — LIDOCAINE HCL (CARDIAC) 20 MG/ML IV SOLN
INTRAVENOUS | Status: DC | PRN
  Administered 2017-10-25: 60 mg via INTRAVENOUS

## 2017-10-25 MED ORDER — ROCURONIUM BROMIDE 100 MG/10ML IV SOLN
INTRAVENOUS | Status: DC | PRN
  Administered 2017-10-25: 30 mg via INTRAVENOUS

## 2017-10-25 MED ORDER — METHYLPREDNISOLONE SODIUM SUCC 125 MG IJ SOLR
INTRAMUSCULAR | Status: DC | PRN
  Administered 2017-10-25: 125 mg via INTRAVENOUS

## 2017-10-25 MED ORDER — METHYLPREDNISOLONE SODIUM SUCC 125 MG IJ SOLR
INTRAMUSCULAR | Status: AC
  Filled 2017-10-25: qty 2

## 2017-10-25 NOTE — Anesthesia Preprocedure Evaluation (Addendum)
Anesthesia Evaluation  Patient identified by MRN, date of birth, ID band Patient awake    Reviewed: Allergy & Precautions, H&P , NPO status , Patient's Chart, lab work & pertinent test results  History of Anesthesia Complications Negative for: history of anesthetic complications  Airway Mallampati: III  TM Distance: <3 FB Neck ROM: limited    Dental  (+) Poor Dentition, Missing, Upper Dentures, Partial Lower   Pulmonary shortness of breath, pneumonia, resolved, COPD,  COPD inhaler, Current Smoker,    Pulmonary exam normal breath sounds clear to auscultation       Cardiovascular Exercise Tolerance: Poor (-) angina+ DOE  (-) Past MI negative cardio ROS Normal cardiovascular exam Rhythm:regular Rate:Normal     Neuro/Psych PSYCHIATRIC DISORDERS Depression  Neuromuscular disease    GI/Hepatic negative GI ROS, Neg liver ROS,   Endo/Other  negative endocrine ROS  Renal/GU negative Renal ROS Bladder dysfunction   negative genitourinary   Musculoskeletal   Abdominal   Peds negative pediatric ROS (+)  Hematology negative hematology ROS (+)   Anesthesia Other Findings Past Medical History: 05/07/2015: Cancer of lower lobe of right lung (HCC) No date: COPD (chronic obstructive pulmonary disease) (HCC) No date: Non-small cell lung cancer ([place]) 04/2015: Pneumonia  Reproductive/Obstetrics negative OB ROS                             Anesthesia Physical  Anesthesia Plan  ASA: III  Anesthesia Plan:    Post-op Pain Management: GA combined w/ Regional for post-op pain   Induction: Intravenous  PONV Risk Score and Plan:   Airway Management Planned: Oral ETT  Additional Equipment:   Intra-op Plan:   Post-operative Plan:   Informed Consent: I have reviewed the patients History and Physical, chart, labs and discussed the procedure including the risks, benefits and alternatives for the proposed  anesthesia with the patient or authorized representative who has indicated his/her understanding and acceptance.   Dental Advisory Given  Plan Discussed with: Anesthesiologist, CRNA and Surgeon  Anesthesia Plan Comments: (Plan to place epidural discussed with surgeon. Surgeon would like to place On-Q pain pump as well.  Plan to pull epidural if not working and use On-Q pain pump.  If epidural working then plan not to use On-Q pump as to avoid possible local anesthetic toxicity.  Surgeon informed not to begin chemical DVT prophylaxis until epidural catheter is removed.  Surgeon voiced understanding of this plan.)        Anesthesia Quick Evaluation

## 2017-10-25 NOTE — Interval H&P Note (Signed)
History and Physical Interval Note:  10/25/2017 12:57 PM  Claudia Chavez  has presented today for surgery, with the diagnosis of LUNG MASS  The various methods of treatment have been discussed with the patient and family. After consideration of risks, benefits and other options for treatment, the patient has consented to  Procedure(s): ELECTROMAGNETIC NAVIGATION BRONCHOSCOPY (N/A) as a surgical intervention .  The patient's history has been reviewed, patient examined, no change in status, stable for surgery.  I have reviewed the patient's chart and labs.  Questions were answered to the patient's satisfaction.     [name]

## 2017-10-25 NOTE — Anesthesia Procedure Notes (Signed)
Procedure Name: Intubation Performed by: Philbert Riser, CRNA Pre-anesthesia Checklist: Patient identified, Timeout performed, Emergency Drugs available, Suction available and Patient being monitored Patient Re-evaluated:Patient Re-evaluated prior to induction Oxygen Delivery Method: Circle system utilized and Simple face mask Preoxygenation: Pre-oxygenation with 100% oxygen Induction Type: IV induction Ventilation: Mask ventilation without difficulty

## 2017-10-25 NOTE — Op Note (Signed)
Electromagnetic Navigation Bronchoscopy: Indication: lung mass/nodule  Preoperative Diagnosis:lung nodule/mass Post Procedure Diagnosis:lung nodule/mass Consent: Verbal/Written  The Risks and Benefits of the procedure explained to patient/family prior to start of procedure and I have discussed the risk for acute bleeding, increased chance of infection, increased chance of respiratory failure and cardiac arrest and death.  I have also explained to avoid all types of NSAIDs to decrease chance of bleeding, and to avoid food and drinks the midnight prior to procedure.  The procedure consists of a video camera with a light source to be placed and inserted  into the lungs to  look for abnormal tissue and to obtain tissue samples by using needle and biopsy tools.  The patient/family understand the risks and benefits and have agreed to proceed with procedure.   Hand washing performed prior to starting the procedure.   Type of Anesthesia: see Anesthesiology records .   Procedure Performed:  Virtual Bronchoscopy with Multi-planar Image analysis, 3-D reconstruction of coronal, sagittal and multi-planar images for the purposes of planning real-time bronchoscopy using the iLogic Electromagnetic Navigation Bronchoscopy System (superDimension).  Description of Procedure: After obtaining informed consent from the patient, the above sedative and anesthetic measures were carried out, flexible fiberoptic bronchoscope was inserted via Endotracheal tube after patient was intubated by CNA/Anesthesiologist.   The virtual camera was then placed into the central portion of the trachea. The trachea itself was inspected.  The main carina, right and left midstem bronchus and all the segmental and subsegmental airways by virtual bronchoscopy were inspected. The camera was directed to standard registration points at the following centers: main carina, right upper lobe bronchus, right lower lobe bronchus, right middle  lobe bronchus, left upper lobe bronchus, and the left lower lobe bronchus. This data was transferred to the i-Logic ENB system for real-time bronchoscopy.   The scope was then navigated to the RUL nodule for tissue sampling This was a difficult case to navigate to  Specimans Obtained:  Transbronchial Fine Needle Aspirations 21G times:2  Transbronchial Forceps Biopsy times-3  Transbronchial Triple Needle Brush:2    Fluoroscopy:  Fluoroscopy was utilized during the course of this procedure to assure that biopsies were taken in a safe manner under fluoroscopic guidance with spot films required.   Complications:None  Estimated Blood Loss: minimal approx 1cc  Monitoring:  The patient was monitored with continuous oximetry and received supplemental nasal cannula oxygen throughout the procedure. In addition, serial blood pressure measurements and continuous electrocardiography showed these physiologic parameters to remain tolerable throughout the procedure.   Assessment and Plan/Additional Comments: Follow up Pathology Reports    Corrin Parker, M.D.  [facility]  Medical Director [place] [facility] Cardio-P[facility] Department

## 2017-10-25 NOTE — Transfer of Care (Signed)
Immediate Anesthesia Transfer of Care Note  Patient: Claudia Chavez  Procedure(s) Performed: ELECTROMAGNETIC NAVIGATION BRONCHOSCOPY (N/A )  Patient Location: PACU  Anesthesia Type:General  Level of Consciousness: awake and alert   Airway & Oxygen Therapy: Patient Spontanous Breathing  Post-op Assessment: Report given to RN  Post vital signs: Reviewed and stable  Last Vitals:  Vitals:   10/25/17 1111 10/25/17 1435  BP: (!) 151/81 (!) 150/77  Pulse: 88 86  Resp: 18 15  Temp: 36.4 C 36.4 C  SpO2: 100% 100%    Last Pain:  Vitals:   10/25/17 1111  TempSrc: Temporal         Complications: No apparent anesthesia complications

## 2017-10-25 NOTE — Anesthesia Post-op Follow-up Note (Signed)
Anesthesia QCDR form completed.        

## 2017-10-25 NOTE — Discharge Instructions (Signed)

## 2017-10-25 NOTE — Anesthesia Postprocedure Evaluation (Signed)
Anesthesia Post Note  Patient: Claudia Chavez  Procedure(s) Performed: ELECTROMAGNETIC NAVIGATION BRONCHOSCOPY (N/A )  Patient location during evaluation: PACU Anesthesia Type: General Level of consciousness: awake and alert and oriented Pain management: pain level controlled Vital Signs Assessment: post-procedure vital signs reviewed and stable Respiratory status: spontaneous breathing Cardiovascular status: blood pressure returned to baseline Anesthetic complications: no     Last Vitals:  Vitals:   10/25/17 1111 10/25/17 1435  BP: (!) 151/81 (!) 150/77  Pulse: 88 86  Resp: 18 15  Temp: 36.4 C 36.4 C  SpO2: 100% 100%    Last Pain:  Vitals:   10/25/17 1435  TempSrc:   PainSc: 0-No pain                 Klarisa Barman

## 2017-10-26 ENCOUNTER — Encounter: Payer: Self-pay | Admitting: Internal Medicine

## 2017-10-29 ENCOUNTER — Telehealth: Payer: Self-pay

## 2017-10-29 LAB — CYTOLOGY - NON PAP

## 2017-10-29 LAB — SURGICAL PATHOLOGY

## 2017-10-29 NOTE — Telephone Encounter (Signed)
Patient returned call and I informed her of her appointment with Dr. Genevive Bi on 2/8 at 8:30. Patient verbalized understanding.

## 2017-10-29 NOTE — Telephone Encounter (Signed)
Left message with patient's sister in law to call office regarding appointment listed below.  Dr.Oaks 11/09/17 @ 8:30

## 2017-11-01 ENCOUNTER — Ambulatory Visit
Admission: RE | Admit: 2017-11-01 | Discharge: 2017-11-01 | Disposition: A | Discharge status: Home / Self Care | Payer: BLUE CROSS/BLUE SHIELD | Source: Ambulatory Visit | Attending: Radiation Oncology | Admitting: Radiation Oncology

## 2017-11-01 DIAGNOSIS — R918 Other nonspecific abnormal finding of lung field: Secondary | ICD-10-CM | POA: Diagnosis not present

## 2017-11-01 NOTE — Telephone Encounter (Signed)
error 

## 2017-11-05 DIAGNOSIS — R918 Other nonspecific abnormal finding of lung field: Secondary | ICD-10-CM | POA: Diagnosis not present

## 2017-11-09 ENCOUNTER — Ambulatory Visit: Payer: BLUE CROSS/BLUE SHIELD | Admitting: Cardiothoracic Surgery

## 2017-11-09 ENCOUNTER — Encounter: Payer: Self-pay | Admitting: Cardiothoracic Surgery

## 2017-11-09 VITALS — BP 158/77 | HR 87 | Temp 98.4°F | Resp 18 | Ht 61.0 in | Wt 100.2 lb

## 2017-11-09 DIAGNOSIS — R911 Solitary pulmonary nodule: Secondary | ICD-10-CM

## 2017-11-09 NOTE — Progress Notes (Signed)
  Patient ID: CHELA SUTPHEN, female   DOB: 1956-01-05, 62 y.o.   MRN: [id number]  HISTORY: She returns today in follow-up.  She has no complaints.  She tolerated her bronchoscopy well.  She did see Dr. Berton Mount who has recommended that she undergo SBRT for her right upper lobe presumed lung cancer.   Vitals:   11/09/17 0830  BP: (!) 158/77  Pulse: 87  Resp: 18  Temp: 98.4 F (36.9 C)  SpO2: 99%     EXAM:    Resp: Lungs are clear bilaterally.  No respiratory distress, normal effort. Heart:  Regular without murmurs Abd:  Abdomen is soft, non distended and non tender. No masses are palpable.  There is no rebound and no guarding.  Neurological: Alert and oriented to person, place, and time. Coordination normal.  Skin: Skin is warm and dry. No rash noted. No diaphoretic. No erythema. No pallor.  Psychiatric: Normal mood and affect. Normal behavior. Judgment and thought content normal.    ASSESSMENT: I have independently reviewed her last CT and PET.  I do believe that the right upper lobe lesion is most likely a malignancy.  I explained to her that given her prior thoracotomy that this would not be amenable to a simple wedge resection.  I thought that radiation therapy would be the best option for her.   PLAN:   She will continue with her radiation therapy and follow-up with the radiation therapist and oncologist.  We did not make a return appointment for her today.    Nestor Lewandowsky, MD

## 2017-11-09 NOTE — Patient Instructions (Signed)
Please call our office if you have any questions or concerns.  

## 2017-11-12 ENCOUNTER — Encounter: Payer: Self-pay | Admitting: Internal Medicine

## 2017-11-12 ENCOUNTER — Inpatient Hospital Stay (HOSPITAL_BASED_OUTPATIENT_CLINIC_OR_DEPARTMENT_OTHER): Payer: BLUE CROSS/BLUE SHIELD | Admitting: Internal Medicine

## 2017-11-12 ENCOUNTER — Ambulatory Visit
Admission: RE | Admit: 2017-11-12 | Discharge: 2017-11-12 | Disposition: A | Discharge status: Home / Self Care | Payer: BLUE CROSS/BLUE SHIELD | Source: Ambulatory Visit | Attending: Radiation Oncology | Admitting: Radiation Oncology

## 2017-11-12 ENCOUNTER — Other Ambulatory Visit: Payer: Self-pay | Admitting: *Deleted

## 2017-11-12 ENCOUNTER — Inpatient Hospital Stay: Payer: BLUE CROSS/BLUE SHIELD | Attending: Internal Medicine

## 2017-11-12 ENCOUNTER — Other Ambulatory Visit: Payer: Self-pay

## 2017-11-12 VITALS — BP 142/73 | HR 69 | Temp 97.9°F | Resp 20 | Ht 61.0 in | Wt 100.0 lb

## 2017-11-12 DIAGNOSIS — M2550 Pain in unspecified joint: Secondary | ICD-10-CM | POA: Diagnosis not present

## 2017-11-12 DIAGNOSIS — R5382 Chronic fatigue, unspecified: Secondary | ICD-10-CM | POA: Diagnosis not present

## 2017-11-12 DIAGNOSIS — R918 Other nonspecific abnormal finding of lung field: Secondary | ICD-10-CM | POA: Diagnosis not present

## 2017-11-12 DIAGNOSIS — C3431 Malignant neoplasm of lower lobe, right bronchus or lung: Secondary | ICD-10-CM

## 2017-11-12 DIAGNOSIS — Z95828 Presence of other vascular implants and grafts: Secondary | ICD-10-CM

## 2017-11-12 DIAGNOSIS — Z720 Tobacco use: Secondary | ICD-10-CM

## 2017-11-12 DIAGNOSIS — G8929 Other chronic pain: Secondary | ICD-10-CM | POA: Insufficient documentation

## 2017-11-12 DIAGNOSIS — J449 Chronic obstructive pulmonary disease, unspecified: Secondary | ICD-10-CM | POA: Insufficient documentation

## 2017-11-12 LAB — CBC WITH DIFFERENTIAL/PLATELET
Basophils Absolute: 0.1 10*3/uL (ref 0–0.1)
Basophils Relative: 1 %
EOS ABS: 0.1 10*3/uL (ref 0–0.7)
Eosinophils Relative: 1 %
HCT: 38.6 % (ref 35.0–47.0)
HEMOGLOBIN: 13 g/dL (ref 12.0–16.0)
LYMPHS ABS: 1.8 10*3/uL (ref 1.0–3.6)
Lymphocytes Relative: 28 %
MCH: 35.3 pg — AB (ref 26.0–34.0)
MCHC: 33.7 g/dL (ref 32.0–36.0)
MCV: 104.5 fL — ABNORMAL HIGH (ref 80.0–100.0)
Monocytes Absolute: 0.6 10*3/uL (ref 0.2–0.9)
Monocytes Relative: 10 %
NEUTROS PCT: 60 %
Neutro Abs: 3.8 10*3/uL (ref 1.4–6.5)
Platelets: 344 10*3/uL (ref 150–440)
RBC: 3.7 MIL/uL — AB (ref 3.80–5.20)
RDW: 14.4 % (ref 11.5–14.5)
WBC: 6.4 10*3/uL (ref 3.6–11.0)

## 2017-11-12 LAB — COMPREHENSIVE METABOLIC PANEL
ALT: 16 U/L (ref 14–54)
AST: 22 U/L (ref 15–41)
Albumin: 3.9 g/dL (ref 3.5–5.0)
Alkaline Phosphatase: 119 U/L (ref 38–126)
Anion gap: 3 — ABNORMAL LOW (ref 5–15)
BUN: 11 mg/dL (ref 6–20)
CHLORIDE: 109 mmol/L (ref 101–111)
CO2: 26 mmol/L (ref 22–32)
CREATININE: 0.63 mg/dL (ref 0.44–1.00)
Calcium: 8.9 mg/dL (ref 8.9–10.3)
Glucose, Bld: 68 mg/dL (ref 65–99)
POTASSIUM: 4.1 mmol/L (ref 3.5–5.1)
Sodium: 138 mmol/L (ref 135–145)
Total Bilirubin: 0.5 mg/dL (ref 0.3–1.2)
Total Protein: 7.2 g/dL (ref 6.5–8.1)

## 2017-11-12 MED ORDER — HEPARIN SOD (PORK) LOCK FLUSH 100 UNIT/ML IV SOLN
500.0000 [IU] | Freq: Once | INTRAVENOUS | Status: AC
  Administered 2017-11-12: 500 [IU] via INTRAVENOUS

## 2017-11-12 MED ORDER — SODIUM CHLORIDE 0.9% FLUSH
10.0000 mL | INTRAVENOUS | Status: DC | PRN
  Administered 2017-11-12: 10 mL via INTRAVENOUS
  Filled 2017-11-12: qty 10

## 2017-11-12 NOTE — Progress Notes (Signed)
Patient here for f/u for h/o lung cancer. She has no medical complaints.

## 2017-11-12 NOTE — Assessment & Plan Note (Addendum)
#   STAGE IIIA squamous cell lung cancer status post neoadjuvant chemotherapy followed by surgery; adjuvant radiation finished in February 2017.   # CT JAN 2019-shows ~1cm STABLE lesion in RUL-however more solid[previously cavitary]; right hilar LN. based on imaging-highly concerning for malignancy agree with SBR T.  Discussed that SBR T-has a success rate of 80-90% at 3 years.   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   # port flush-  Port flushed today;  She'll have it flushed every 8 weeks  # smoke 2-3 cig/day; recommend quitting smoking.   # COPD-STABLE.   # follow up in 2 months/no labs;port flush.

## 2017-11-12 NOTE — Progress Notes (Signed)
[place] OFFICE PROGRESS NOTE  Patient Care Team: Maryland Pink, MD as PCP - General (Family Medicine)  Cancer Staging Cancer of lower lobe of right lung [facility]) Staging form: Lung, AJCC 7th Edition - Clinical: Stage IIIA (T3, N2, M0) - Signed by Forest Gleason, MD on 05/07/2015    Oncology History   # Squamous cell carcinoma of right LOWER LOBE OF lung stage IIIA based on PET scan Biopsy of the (August of 2016). 2. After 3 cycles of chemotherapy patient underwent resection of lung mass (November, 2016) ypT2B ypN0 [Dr.Oaks] status post right lower lobectomy with a 3. She was started on carboplatinum and Taxol on a weekly basis and radiation therapy from January of 2017 4. After 2 cycles of carboplatinum patient had neuropathy grade 2 interfering with work so chemotherapy was put on hold and radiation was continued (January 19th, 2017) 5. Chemotherapy was discontinued because of progressive neuropathy. Patient is finishing of radiation therapy on November 19, 2015  # NOV 2017- CT- 27mm right hilar LN; PET- Feb 2018- NED.      Cancer of lower lobe of right lung ([place])    INTERVAL HISTORY:  Claudia Chavez 62 y.o.  female pleasant patient above history of Stage III squamous cell lung cancer; right upper lobe lung nodule is here for follow-up.   The right upper lobe lung nodule concerning for new primary-patient imaging.  Bronchoscopy ENB-unsuccessful with diagnosis.  However given concerning findings-proceeded with SBR T starting today.  Patient was also evaluated by thoracic surgery not felt a candidate for wedge resection.  Based on imaging characteristics proceed with  Patient denies any headaches. Denies any nausea vomiting diarrhea. Continues to have chronic fatigue. Chronic joint pains. Not any worse. No bone pain. Mild chronic shortness of breath or chronic cough. No hemoptysis.  REVIEW OF SYSTEMS:  A complete 10 point review of system is done which is  negative except mentioned above/history of present illness.   PAST MEDICAL HISTORY :  Past Medical History:  Diagnosis Date  . Cancer of lower lobe of right lung ([place]) 05/07/2015  . COPD (chronic obstructive pulmonary disease) ([place])   . Non-small cell lung cancer ([place])   . Pneumonia 04/2015    PAST SURGICAL HISTORY :   Past Surgical History:  Procedure Laterality Date  . ABDOMINAL HYSTERECTOMY    . ELBOW SURGERY Right 1995  . ELECTROMAGNETIC NAVIGATION BROCHOSCOPY N/A 10/25/2017   Procedure: ELECTROMAGNETIC NAVIGATION BRONCHOSCOPY;  Surgeon: [name], MD;  Location: [facility] ORS;  Service: Cardiopulmonary;  Laterality: N/A;  . PORTACATH PLACEMENT Right 05/10/2015   Procedure: INSERTION PORT-A-CATH;  Surgeon: Nestor Lewandowsky, MD;  Location: [facility] ORS;  Service: General;  Laterality: Right;  Marland Kitchen VIDEO ASSISTED THORACOSCOPY (VATS)/THOROCOTOMY Right 08/18/2015   Procedure: VIDEO ASSISTED THORACOSCOPY (VATS)/THOROCOTOMY;  Surgeon: Nestor Lewandowsky, MD;  Location: [facility] ORS;  Service: General;  Laterality: Right;    FAMILY HISTORY :   Family History  Problem Relation Age of Onset  . Stroke Mother   . Lung cancer Father 21    SOCIAL HISTORY:   Social History   Tobacco Use  . Smoking status: Current Every Day Smoker    Packs/day: 0.50    Years: 40.00    Pack years: 20.00    Types: Cigarettes  . Smokeless tobacco: Never Used  . Tobacco comment: "1 pk or less per day"  Substance Use Topics  . Alcohol use: Yes    Alcohol/week: 1.2 - 2.4 oz    Types: 2 -  4 Cans of beer per week    Comment: beer-occasionally  . Drug use: No    ALLERGIES:  is allergic to lyrica [pregabalin].  MEDICATIONS:  Current Outpatient Medications  Medication Sig Dispense Refill  . Fluticasone-Salmeterol (ADVAIR DISKUS) 250-50 MCG/DOSE AEPB INHALE 1 INHALATION INTO THE LUNGS EVERY 12 (TWELVE) HOURS.     No current facility-administered medications for this visit.    Facility-Administered Medications Ordered in Other  Visits  Medication Dose Route Frequency Provider Last Rate Last Dose  . heparin lock flush 100 unit/mL  500 Units Intracatheter Once PRN Forest Gleason, MD      . heparin lock flush 100 unit/mL  500 Units Intravenous Once Charlaine Dalton R, MD      . sodium chloride flush (NS) 0.9 % injection 10 mL  10 mL Intravenous PRN Cammie Sickle, MD        PHYSICAL EXAMINATION: ECOG PERFORMANCE STATUS: 0 - Asymptomatic  BP (!) 142/73   Pulse 69   Temp 97.9 F (36.6 C) (Tympanic)   Resp 20   Ht 5\' 1"  (1.549 m)   Wt 100 lb (45.4 kg)   BMI 18.89 kg/m   Filed Weights   11/12/17 1126  Weight: 100 lb (45.4 kg)    GENERAL: Well-nourished well-developed; Alert, no distress and comfortable.   Alone.  EYES: no pallor or icterus OROPHARYNX: no thrush or ulceration; good dentition  NECK: supple, no masses felt LYMPH:  no palpable lymphadenopathy in the cervical, axillary or inguinal regions LUNGS: clear to auscultation and  No wheeze or crackles HEART/CVS: regular rate & rhythm and no murmurs; No lower extremity edema ABDOMEN:abdomen soft, non-tender and normal bowel sounds Musculoskeletal:no cyanosis of digits and no clubbing  PSYCH: alert & oriented x 3 with fluent speech NEURO: no focal motor/sensory deficits SKIN:  no rashes or significant lesions  LABORATORY DATA:  I have reviewed the data as listed    Component Value Date/Time   NA 138 11/12/2017 1011   K 4.1 11/12/2017 1011   CL 109 11/12/2017 1011   CO2 26 11/12/2017 1011   GLUCOSE 68 11/12/2017 1011   BUN 11 11/12/2017 1011   CREATININE 0.63 11/12/2017 1011   CALCIUM 8.9 11/12/2017 1011   PROT 7.2 11/12/2017 1011   ALBUMIN 3.9 11/12/2017 1011   AST 22 11/12/2017 1011   ALT 16 11/12/2017 1011   ALKPHOS 119 11/12/2017 1011   BILITOT 0.5 11/12/2017 1011   GFRNONAA >60 11/12/2017 1011   GFRAA >60 11/12/2017 1011    No results found for: SPEP, UPEP  Lab Results  Component Value Date   WBC 6.4 11/12/2017    NEUTROABS 3.8 11/12/2017   HGB 13.0 11/12/2017   HCT 38.6 11/12/2017   MCV 104.5 (H) 11/12/2017   PLT 344 11/12/2017      Chemistry      Component Value Date/Time   NA 138 11/12/2017 1011   K 4.1 11/12/2017 1011   CL 109 11/12/2017 1011   CO2 26 11/12/2017 1011   BUN 11 11/12/2017 1011   CREATININE 0.63 11/12/2017 1011      Component Value Date/Time   CALCIUM 8.9 11/12/2017 1011   ALKPHOS 119 11/12/2017 1011   AST 22 11/12/2017 1011   ALT 16 11/12/2017 1011   BILITOT 0.5 11/12/2017 1011     IMPRESSION: 1. Surgical changes of at least partial right lower lobectomy. 2. New partially cavitary right upper lobe pulmonary nodule, most consistent with metastatic disease versus metachronous primary. 3. Slight  enlargement of a borderline pathologically sized right hilar lymph node. Indeterminate. Cannot exclude nodal metastasis. 4. Similar small right pleural effusion. 5.  Emphysema (ICD10-J43.9). 6. Coronary artery atherosclerosis. Aortic Atherosclerosis (ICD10-I70.0). Dictation #1 [id number]  [id number]   Electronically Signed   By: Abigail Miyamoto M.D.   On: 05/25/2017 11:23 ------------------------------------------------------   IMPRESSION: 1. Right upper lobe nodule is grossly stable in size 05/25/2017 but appears more solid in appearance than on 05/25/2017 and is new from 08/07/2016. Findings are worrisome for bronchogenic carcinoma. 2. Aortic atherosclerosis (ICD10-170.0). Moderate to severe coronary artery calcification. 3.  Emphysema (ICD10-J43.9).   Electronically Signed   By: Lorin Picket M.D.   On: 10/08/2017 10:21    RADIOGRAPHIC STUDIES: I have personally reviewed the radiological images as listed and agreed with the findings in the report. No results found.   ASSESSMENT & PLAN:  Cancer of lower lobe of right lung ([place]) # STAGE IIIA squamous cell lung cancer status post neoadjuvant chemotherapy followed by surgery; adjuvant radiation  finished in February 2017.   # CT JAN 2019-shows ~1cm STABLE lesion in RUL-however more solid[previously cavitary]; right hilar LN. based on imaging-highly concerning for malignancy agree with SBR T.  Discussed that SBR T-has a success rate of 80-90% at 3 years.   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   # port flush-  Port flushed today;  She'll have it flushed every 8 weeks  # smoke 2-3 cig/day; recommend quitting smoking.   # COPD-STABLE.   # follow up in 2 months/no labs;port flush.   No orders of the defined types were placed in this encounter.  All questions were answered. The patient knows to call the clinic with any problems, questions or concerns.      Cammie Sickle, MD 11/13/2017 8:13 AM

## 2017-11-13 ENCOUNTER — Ambulatory Visit: Payer: BLUE CROSS/BLUE SHIELD

## 2017-11-14 ENCOUNTER — Ambulatory Visit
Admission: RE | Admit: 2017-11-14 | Discharge: 2017-11-14 | Disposition: A | Discharge status: Home / Self Care | Payer: BLUE CROSS/BLUE SHIELD | Source: Ambulatory Visit | Attending: Radiation Oncology | Admitting: Radiation Oncology

## 2017-11-14 DIAGNOSIS — R918 Other nonspecific abnormal finding of lung field: Secondary | ICD-10-CM | POA: Diagnosis not present

## 2017-11-15 ENCOUNTER — Ambulatory Visit: Payer: BLUE CROSS/BLUE SHIELD

## 2017-11-19 ENCOUNTER — Ambulatory Visit
Admission: RE | Admit: 2017-11-19 | Discharge: 2017-11-19 | Disposition: A | Discharge status: Home / Self Care | Payer: BLUE CROSS/BLUE SHIELD | Source: Ambulatory Visit | Attending: Radiation Oncology | Admitting: Radiation Oncology

## 2017-11-19 DIAGNOSIS — R918 Other nonspecific abnormal finding of lung field: Secondary | ICD-10-CM | POA: Diagnosis not present

## 2017-11-20 ENCOUNTER — Ambulatory Visit: Payer: BLUE CROSS/BLUE SHIELD

## 2017-11-22 ENCOUNTER — Ambulatory Visit: Payer: BLUE CROSS/BLUE SHIELD

## 2017-11-26 ENCOUNTER — Ambulatory Visit
Admission: RE | Admit: 2017-11-26 | Discharge: 2017-11-26 | Disposition: A | Discharge status: Home / Self Care | Payer: BLUE CROSS/BLUE SHIELD | Source: Ambulatory Visit | Attending: Radiation Oncology | Admitting: Radiation Oncology

## 2017-11-26 DIAGNOSIS — R918 Other nonspecific abnormal finding of lung field: Secondary | ICD-10-CM | POA: Diagnosis not present

## 2017-11-27 ENCOUNTER — Ambulatory Visit: Payer: BLUE CROSS/BLUE SHIELD

## 2017-11-28 ENCOUNTER — Ambulatory Visit
Admission: RE | Admit: 2017-11-28 | Discharge: 2017-11-28 | Disposition: A | Discharge status: Home / Self Care | Payer: BLUE CROSS/BLUE SHIELD | Source: Ambulatory Visit | Attending: Radiation Oncology | Admitting: Radiation Oncology

## 2017-11-28 DIAGNOSIS — R918 Other nonspecific abnormal finding of lung field: Secondary | ICD-10-CM | POA: Diagnosis not present

## 2017-11-29 ENCOUNTER — Ambulatory Visit: Payer: BLUE CROSS/BLUE SHIELD

## 2017-12-04 ENCOUNTER — Telehealth: Payer: Self-pay | Admitting: *Deleted

## 2017-12-04 NOTE — Telephone Encounter (Signed)
Patient called to report that she had seen her PCP Wed last week for Congestion and cough, they put her on steroids and she called them bak Friday because she was no better and they put her on Z Pak. She reports that she stopped the steroid when she started the antibiotics and now she feels shortness of breath and that she is no better. Asking what to do. I checked her chart and see that she has a call in to her PCP office 30 mons prior to her calling us. Discussed with [facility] NP and returned call to patient and told her I see that she contacted her PCP and she stated she had I asked that she await response form them and if she is not happy with that, she can come in to be seen in our office tomorrow. She stated she would await return call form Phone call CP. I advised she needs to let them know that she did not complete the steroids

## 2018-01-07 ENCOUNTER — Other Ambulatory Visit: Payer: BLUE CROSS/BLUE SHIELD

## 2018-01-07 ENCOUNTER — Ambulatory Visit: Payer: BLUE CROSS/BLUE SHIELD | Admitting: Internal Medicine

## 2018-01-10 ENCOUNTER — Inpatient Hospital Stay (HOSPITAL_BASED_OUTPATIENT_CLINIC_OR_DEPARTMENT_OTHER): Payer: BLUE CROSS/BLUE SHIELD | Admitting: Internal Medicine

## 2018-01-10 ENCOUNTER — Other Ambulatory Visit: Payer: Self-pay

## 2018-01-10 ENCOUNTER — Encounter: Payer: Self-pay | Admitting: Internal Medicine

## 2018-01-10 ENCOUNTER — Other Ambulatory Visit: Payer: Self-pay | Admitting: *Deleted

## 2018-01-10 ENCOUNTER — Ambulatory Visit
Admission: RE | Admit: 2018-01-10 | Discharge: 2018-01-10 | Disposition: A | Discharge status: Home / Self Care | Payer: BLUE CROSS/BLUE SHIELD | Source: Ambulatory Visit | Attending: Radiation Oncology | Admitting: Radiation Oncology

## 2018-01-10 ENCOUNTER — Encounter: Payer: Self-pay | Admitting: Radiation Oncology

## 2018-01-10 ENCOUNTER — Inpatient Hospital Stay: Payer: BLUE CROSS/BLUE SHIELD | Attending: Internal Medicine

## 2018-01-10 VITALS — BP 152/74 | Temp 96.1°F | Resp 20 | Wt 99.2 lb

## 2018-01-10 VITALS — BP 152/74 | Temp 96.1°F | Resp 20

## 2018-01-10 DIAGNOSIS — R5382 Chronic fatigue, unspecified: Secondary | ICD-10-CM

## 2018-01-10 DIAGNOSIS — Z720 Tobacco use: Secondary | ICD-10-CM | POA: Insufficient documentation

## 2018-01-10 DIAGNOSIS — Z95828 Presence of other vascular implants and grafts: Secondary | ICD-10-CM

## 2018-01-10 DIAGNOSIS — Z85118 Personal history of other malignant neoplasm of bronchus and lung: Secondary | ICD-10-CM | POA: Insufficient documentation

## 2018-01-10 DIAGNOSIS — R05 Cough: Secondary | ICD-10-CM | POA: Diagnosis not present

## 2018-01-10 DIAGNOSIS — C3431 Malignant neoplasm of lower lobe, right bronchus or lung: Secondary | ICD-10-CM | POA: Diagnosis not present

## 2018-01-10 DIAGNOSIS — Z08 Encounter for follow-up examination after completed treatment for malignant neoplasm: Secondary | ICD-10-CM | POA: Diagnosis not present

## 2018-01-10 DIAGNOSIS — Z923 Personal history of irradiation: Secondary | ICD-10-CM | POA: Diagnosis not present

## 2018-01-10 DIAGNOSIS — J449 Chronic obstructive pulmonary disease, unspecified: Secondary | ICD-10-CM | POA: Insufficient documentation

## 2018-01-10 MED ORDER — IPRATROPIUM-ALBUTEROL 0.5-2.5 (3) MG/3ML IN SOLN: 3.0000 mL | RESPIRATORY_TRACT | 3 refills | Status: DC | PRN

## 2018-01-10 MED ORDER — HEPARIN SOD (PORK) LOCK FLUSH 100 UNIT/ML IV SOLN
500.0000 [IU] | INTRAVENOUS | Status: AC | PRN
  Administered 2018-01-10: 500 [IU]

## 2018-01-10 MED ORDER — SODIUM CHLORIDE 0.9% FLUSH
10.0000 mL | INTRAVENOUS | Status: AC | PRN
  Administered 2018-01-10: 10 mL
  Filled 2018-01-10: qty 10

## 2018-01-10 MED ORDER — PREDNISONE 20 MG PO TABS: ORAL_TABLET | ORAL | 0 refills | Status: DC

## 2018-01-10 NOTE — Progress Notes (Signed)
.  Radiation Oncology Follow up Note  Name: Claudia Chavez   Date:   01/10/2018 MRN:  [id number] DOB: Oct 21, 1955    This 62 y.o. female presents to the clinic today for one-month follow-up status post SB RT to her right lung right upper lobe for non-small cell lung cancer stage I.  REFERRING PROVIDER: Maryland Pink, MD  HPI: patient is a 62 year old female now 1 month out having completed SB RT to her right upper lobe for probable non-small cell lung cancer stage I. She is previous treated with combined modality treatment for stage IIIa (T3 N2 M0) she seen today in routine follow-up she states her breathing is somewhat more labored. She also has some wheezing and nonproductive cough. Her weight is stable no dysphagia by mouth intake is good. No hemoptysis..  COMPLICATIONS OF TREATMENT: none  FOLLOW UP COMPLIANCE: keeps appointments   PHYSICAL EXAM:  BP (!) 152/74   Temp (!) 96.1 F (35.6 C)   Resp 20   Wt 99 lb 3.3 oz (45 kg)   BMI 18.74 kg/m  Well-developed well-nourished patient in NAD. HEENT reveals PERLA, EOMI, discs not visualized.  Oral cavity is clear. No oral mucosal lesions are identified. Neck is clear without evidence of cervical or supraclavicular adenopathy. Lungs are clear to A&P. Cardiac examination is essentially unremarkable with regular rate and rhythm without murmur rub or thrill. Abdomen is benign with no organomegaly or masses noted. Motor sensory and DTR levels are equal and symmetric in the upper and lower extremities. Cranial nerves II through XII are grossly intact. Proprioception is intact. No peripheral adenopathy or edema is identified. No motor or sensory levels are noted. Crude visual fields are within normal range.  RADIOLOGY RESULTS: CT scan in 2 months ordered with contrast of her chest  PLAN: resent time she is recovering nicely from her radiation therapy treatments. She is complaining of some increased pulmonary symptoms may need repeat referral back to  pulmonology for a tuneup. She is seeing medical oncology today after my appointment. I've otherwise please were overall progress I've ordered a CT scan of her chest with contrast in 2 months for follow-uortly thereafter. She continues close follow-up care with medical oncology.  I would like to take this opportunity to thank you for allowing me to participate in the care of your patient.Noreene Filbert, MD

## 2018-01-10 NOTE — Progress Notes (Signed)
[name] OFFICE PROGRESS NOTE  Patient Care Team: [place] Pink, MD as PCP - General (Family Medicine)  Cancer Staging Cancer of lower lobe of right lung [facility]) Staging form: Lung, AJCC 7th Edition - Clinical: Stage IIIA (T3, N2, M0) - Signed by Forest Gleason, MD on 05/07/2015    Oncology History   # Squamous cell carcinoma of right LOWER LOBE OF lung stage IIIA based on PET scan Biopsy of the (August of 2016). 2. After 3 cycles of chemotherapy patient underwent resection of lung mass (November, 2016) ypT2B ypN0 [Dr.Oaks] status post right lower lobectomy with a 3. She was started on carboplatinum and Taxol on a weekly basis and radiation therapy from January of 2017 4. After 2 cycles of carboplatinum patient had neuropathy grade 2 interfering with work so chemotherapy was put on hold and radiation was continued (January 19th, 2017) 5. Chemotherapy was discontinued because of progressive neuropathy. Patient is finishing of radiation therapy on November 19, 2015  # NOV 2017- CT- 40mm right hilar LN; PET- Feb 2018- NED.   # DEC-JAN 2019- RUL Lung nodule [s/p Bronch; Dr.Kasa- non-diagnostic] SBRT Feb 2019     Cancer of lower lobe of right lung ([place])    INTERVAL HISTORY:  Claudia Chavez 62 y.o.  female pleasant patient above history of Stage III squamous cell lung cancer; right upper lobe lung nodule is here for follow-up.   Patient is currently status post SBR T of the right upper lobe lung nodule-in February 2019.  Patient complains of worsening shortness of breath and also cough without hemoptysis for the last 4-6 weeks.  This is progressive getting worse since post radiation.  Patient denies any headaches. Denies any nausea vomiting diarrhea. Continues to have chronic fatigue. Chronic joint pains. Not any worse. No bone pain.   REVIEW OF SYSTEMS:  A complete 10 point review of system is done which is negative except mentioned above/history of present  illness.   PAST MEDICAL HISTORY :  Past Medical History:  Diagnosis Date  . Cancer of lower lobe of right lung ([name]) 05/07/2015  . COPD (chronic obstructive pulmonary disease) ([place])   . Non-small cell lung cancer ([place])   . Pneumonia 04/2015    PAST SURGICAL HISTORY :   Past Surgical History:  Procedure Laterality Date  . ABDOMINAL HYSTERECTOMY    . ELBOW SURGERY Right 1995  . ELECTROMAGNETIC NAVIGATION BROCHOSCOPY N/A 10/25/2017   Procedure: ELECTROMAGNETIC NAVIGATION BRONCHOSCOPY;  Surgeon: [name], MD;  Location: [facility] ORS;  Service: Cardiopulmonary;  Laterality: N/A;  . PORTACATH PLACEMENT Right 05/10/2015   Procedure: INSERTION PORT-A-CATH;  Surgeon: Nestor Lewandowsky, MD;  Location: [facility] ORS;  Service: General;  Laterality: Right;  Marland Kitchen VIDEO ASSISTED THORACOSCOPY (VATS)/THOROCOTOMY Right 08/18/2015   Procedure: VIDEO ASSISTED THORACOSCOPY (VATS)/THOROCOTOMY;  Surgeon: Nestor Lewandowsky, MD;  Location: [facility] ORS;  Service: General;  Laterality: Right;    FAMILY HISTORY :   Family History  Problem Relation Age of Onset  . Stroke Mother   . Lung cancer Father 60    SOCIAL HISTORY:   Social History   Tobacco Use  . Smoking status: Current Every Day Smoker    Packs/day: 0.50    Years: 40.00    Pack years: 20.00    Types: Cigarettes  . Smokeless tobacco: Never Used  . Tobacco comment: "1 pk or less per day"  Substance Use Topics  . Alcohol use: Yes    Alcohol/week: 1.2 - 2.4 oz    Types: 2 -  4 Cans of beer per week    Comment: beer-occasionally  . Drug use: No    ALLERGIES:  is allergic to lyrica [pregabalin].  MEDICATIONS:  Current Outpatient Medications  Medication Sig Dispense Refill  . albuterol (PROVENTIL HFA;VENTOLIN HFA) 108 (90 Base) MCG/ACT inhaler Inhale 1 puff into the lungs every 6 (six) hours as needed for wheezing or shortness of breath.    . Fluticasone-Salmeterol (ADVAIR DISKUS) 250-50 MCG/DOSE AEPB INHALE 1 INHALATION INTO THE LUNGS EVERY 12 (TWELVE) HOURS.     Marland Kitchen ipratropium-albuterol (DUONEB) 0.5-2.5 (3) MG/3ML SOLN Take 3 mLs by nebulization every 4 (four) hours as needed. 360 mL 3  . predniSONE (DELTASONE) 20 MG tablet Take 2 pills once a day 1 week and then 1 pill once a day for 2 weeks. Take in in Morning with food. 21 tablet 0   No current facility-administered medications for this visit.    Facility-Administered Medications Ordered in Other Visits  Medication Dose Route Frequency Provider Last Rate Last Dose  . heparin lock flush 100 unit/mL  500 Units Intracatheter Once PRN Choksi, Delorise Shiner, MD      . heparin lock flush 100 unit/mL  500 Units Intravenous Once Charlaine Dalton R, MD      . sodium chloride flush (NS) 0.9 % injection 10 mL  10 mL Intravenous PRN Cammie Sickle, MD        PHYSICAL EXAMINATION: ECOG PERFORMANCE STATUS: 0 - Asymptomatic  BP (!) 152/74   Temp (!) 96.1 F (35.6 C) (Tympanic) Comment: vitals taken in radiation therapy  Resp 20   There were no vitals filed for this visit.  GENERAL: Well-nourished well-developed; Alert, no distress and comfortable.   Alone.  EYES: no pallor or icterus OROPHARYNX: no thrush or ulceration; good dentition  NECK: supple, no masses felt LYMPH:  no palpable lymphadenopathy in the cervical, axillary or inguinal regions LUNGS: clear to auscultation and  No wheeze or crackles HEART/CVS: regular rate & rhythm and no murmurs; No lower extremity edema ABDOMEN:abdomen soft, non-tender and normal bowel sounds Musculoskeletal:no cyanosis of digits and no clubbing  PSYCH: alert & oriented x 3 with fluent speech NEURO: no focal motor/sensory deficits SKIN:  no rashes or significant lesions  LABORATORY DATA:  I have reviewed the data as listed    Component Value Date/Time   NA 138 11/12/2017 1011   K 4.1 11/12/2017 1011   CL 109 11/12/2017 1011   CO2 26 11/12/2017 1011   GLUCOSE 68 11/12/2017 1011   BUN 11 11/12/2017 1011   CREATININE 0.63 11/12/2017 1011   CALCIUM 8.9  11/12/2017 1011   PROT 7.2 11/12/2017 1011   ALBUMIN 3.9 11/12/2017 1011   AST 22 11/12/2017 1011   ALT 16 11/12/2017 1011   ALKPHOS 119 11/12/2017 1011   BILITOT 0.5 11/12/2017 1011   GFRNONAA >60 11/12/2017 1011   GFRAA >60 11/12/2017 1011    No results found for: SPEP, UPEP  Lab Results  Component Value Date   WBC 6.4 11/12/2017   NEUTROABS 3.8 11/12/2017   HGB 13.0 11/12/2017   HCT 38.6 11/12/2017   MCV 104.5 (H) 11/12/2017   PLT 344 11/12/2017      Chemistry      Component Value Date/Time   NA 138 11/12/2017 1011   K 4.1 11/12/2017 1011   CL 109 11/12/2017 1011   CO2 26 11/12/2017 1011   BUN 11 11/12/2017 1011   CREATININE 0.63 11/12/2017 1011      Component Value Date/Time  CALCIUM 8.9 11/12/2017 1011   ALKPHOS 119 11/12/2017 1011   AST 22 11/12/2017 1011   ALT 16 11/12/2017 1011   BILITOT 0.5 11/12/2017 1011     IMPRESSION: 1. Surgical changes of at least partial right lower lobectomy. 2. New partially cavitary right upper lobe pulmonary nodule, most consistent with metastatic disease versus metachronous primary. 3. Slight enlargement of a borderline pathologically sized right hilar lymph node. Indeterminate. Cannot exclude nodal metastasis. 4. Similar small right pleural effusion. 5.  Emphysema (ICD10-J43.9). 6. Coronary artery atherosclerosis. Aortic Atherosclerosis (ICD10-I70.0). Dictation #1 [id number]  [id number]   Electronically Signed   By: Abigail Miyamoto M.D.   On: 05/25/2017 11:23 ------------------------------------------------------   IMPRESSION: 1. Right upper lobe nodule is grossly stable in size 05/25/2017 but appears more solid in appearance than on 05/25/2017 and is new from 08/07/2016. Findings are worrisome for bronchogenic carcinoma. 2. Aortic atherosclerosis (ICD10-170.0). Moderate to severe coronary artery calcification. 3.  Emphysema (ICD10-J43.9).   Electronically Signed   By: Lorin Picket M.D.   On:  10/08/2017 10:21    RADIOGRAPHIC STUDIES: I have personally reviewed the radiological images as listed and agreed with the findings in the report. No results found.   ASSESSMENT & PLAN:  Cancer of lower lobe of right lung ([place]) #Right upper lobe stage I lung cancer [biopsy nondiagnostic]- s/p feb 22nd SBRT;  awaiting repeat CT scan in May 2019.  Patient tolerated treatment well except for-worsening shortness of breath [see discussion below]  #Worsening shortness of breath/cough post radiation-question COPD versus radiation pneumonitis.  Recommend a chest x-ray.  Also start patient on steroids/prednisone with a taper over 3 weeks.  Also recommend duo nebs nebulizer.  Prescription given.  Patient was asked to call us if symptoms does not improve.  # STAGE IIIA squamous cell lung cancer status post neoadjuvant chemotherapy followed by surgery; adjuvant radiation finished in February 2017. .   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   # port flush-  Port flushed today;  She'll have it flushed every 8 weeks  # follow up in 2 months/no labs;port flush [after June 14th- CT thru Dr.Crystal]   Orders Placed This Encounter  Procedures  . DG Chest 2 View    Standing Status:   Future    Standing Expiration Date:   03/12/2019    Order Specific Question:   Reason for Exam (SYMPTOM  OR DIAGNOSIS REQUIRED)    Answer:   cough    Order Specific Question:   Preferred imaging location?    Answer:   [facility]   All questions were answered. The patient knows to call the clinic with any problems, questions or concerns.      Cammie Sickle, MD 01/11/2018 11:05 AM

## 2018-01-10 NOTE — Progress Notes (Signed)
Patient here for follow-up with Dr. Rogue Bussing. She has finished her radiation therapy. She has noted increasing shortness of breath over the last month since finishing radiation therapy. She uses her inhalers as directed. She states that she often gets short of breath with activities-ADLS around the house. Must frequently stop to catch her breath and slow down. Patient attempts to remain active as much as possible.

## 2018-01-10 NOTE — Assessment & Plan Note (Addendum)
#  Right upper lobe stage I lung cancer [biopsy nondiagnostic]- s/p feb 22nd SBRT;  awaiting repeat CT scan in May 2019.  Patient tolerated treatment well except for-worsening shortness of breath [see discussion below]  #Worsening shortness of breath/cough post radiation-question COPD versus radiation pneumonitis.  Recommend a chest x-ray.  Also start patient on steroids/prednisone with a taper over 3 weeks.  Also recommend duo nebs nebulizer.  Prescription given.  Patient was asked to call us if symptoms does not improve.  # STAGE IIIA squamous cell lung cancer status post neoadjuvant chemotherapy followed by surgery; adjuvant radiation finished in February 2017. .   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   # port flush-  Port flushed today;  She'll have it flushed every 8 weeks  # follow up in 2 months/no labs;port flush [after June 14th- CT thru Dr.Crystal]

## 2018-01-11 ENCOUNTER — Other Ambulatory Visit: Payer: Self-pay | Admitting: Internal Medicine

## 2018-01-14 ENCOUNTER — Other Ambulatory Visit: Payer: Self-pay | Admitting: Internal Medicine

## 2018-03-04 ENCOUNTER — Ambulatory Visit: Payer: BLUE CROSS/BLUE SHIELD | Admitting: Radiation Oncology

## 2018-03-13 DIAGNOSIS — C3431 Malignant neoplasm of lower lobe, right bronchus or lung: Secondary | ICD-10-CM | POA: Diagnosis not present

## 2018-03-13 DIAGNOSIS — R05 Cough: Secondary | ICD-10-CM | POA: Diagnosis not present

## 2018-03-13 DIAGNOSIS — J449 Chronic obstructive pulmonary disease, unspecified: Secondary | ICD-10-CM | POA: Diagnosis not present

## 2018-03-13 DIAGNOSIS — J069 Acute upper respiratory infection, unspecified: Secondary | ICD-10-CM | POA: Diagnosis not present

## 2018-03-15 ENCOUNTER — Ambulatory Visit
Admission: RE | Admit: 2018-03-15 | Discharge: 2018-03-15 | Disposition: A | Discharge status: Home / Self Care | Payer: PPO | Source: Ambulatory Visit | Attending: Radiation Oncology | Admitting: Radiation Oncology

## 2018-03-15 DIAGNOSIS — R918 Other nonspecific abnormal finding of lung field: Secondary | ICD-10-CM | POA: Insufficient documentation

## 2018-03-15 DIAGNOSIS — J439 Emphysema, unspecified: Secondary | ICD-10-CM | POA: Diagnosis not present

## 2018-03-15 DIAGNOSIS — I700 Atherosclerosis of aorta: Secondary | ICD-10-CM | POA: Diagnosis not present

## 2018-03-15 DIAGNOSIS — R590 Localized enlarged lymph nodes: Secondary | ICD-10-CM | POA: Insufficient documentation

## 2018-03-15 DIAGNOSIS — C3431 Malignant neoplasm of lower lobe, right bronchus or lung: Secondary | ICD-10-CM | POA: Diagnosis not present

## 2018-03-15 LAB — POCT I-STAT CREATININE: CREATININE: 0.5 mg/dL (ref 0.44–1.00)

## 2018-03-15 MED ORDER — IOPAMIDOL (ISOVUE-300) INJECTION 61%
60.0000 mL | Freq: Once | INTRAVENOUS | Status: AC | PRN
  Administered 2018-03-15: 60 mL via INTRAVENOUS

## 2018-03-18 ENCOUNTER — Inpatient Hospital Stay: Payer: PPO | Attending: Oncology

## 2018-03-18 ENCOUNTER — Encounter: Payer: Self-pay | Admitting: Oncology

## 2018-03-18 ENCOUNTER — Inpatient Hospital Stay (HOSPITAL_BASED_OUTPATIENT_CLINIC_OR_DEPARTMENT_OTHER): Payer: PPO | Admitting: Oncology

## 2018-03-18 ENCOUNTER — Other Ambulatory Visit: Payer: Self-pay

## 2018-03-18 VITALS — BP 153/54 | HR 89 | Temp 97.9°F | Resp 18 | Wt 100.2 lb

## 2018-03-18 DIAGNOSIS — R5382 Chronic fatigue, unspecified: Secondary | ICD-10-CM | POA: Insufficient documentation

## 2018-03-18 DIAGNOSIS — G629 Polyneuropathy, unspecified: Secondary | ICD-10-CM | POA: Insufficient documentation

## 2018-03-18 DIAGNOSIS — Z720 Tobacco use: Secondary | ICD-10-CM | POA: Diagnosis not present

## 2018-03-18 DIAGNOSIS — C3431 Malignant neoplasm of lower lobe, right bronchus or lung: Secondary | ICD-10-CM | POA: Diagnosis not present

## 2018-03-18 DIAGNOSIS — R05 Cough: Secondary | ICD-10-CM | POA: Insufficient documentation

## 2018-03-18 DIAGNOSIS — R0602 Shortness of breath: Secondary | ICD-10-CM | POA: Diagnosis not present

## 2018-03-18 DIAGNOSIS — C3490 Malignant neoplasm of unspecified part of unspecified bronchus or lung: Secondary | ICD-10-CM

## 2018-03-18 DIAGNOSIS — Z95828 Presence of other vascular implants and grafts: Secondary | ICD-10-CM

## 2018-03-18 MED ORDER — SODIUM CHLORIDE 0.9% FLUSH
10.0000 mL | Freq: Once | INTRAVENOUS | Status: AC
  Administered 2018-03-18: 10 mL via INTRAVENOUS
  Filled 2018-03-18: qty 10

## 2018-03-18 MED ORDER — HEPARIN SOD (PORK) LOCK FLUSH 100 UNIT/ML IV SOLN
500.0000 [IU] | Freq: Once | INTRAVENOUS | Status: AC
  Administered 2018-03-18: 500 [IU] via INTRAVENOUS

## 2018-03-18 NOTE — Progress Notes (Signed)
[place] OFFICE PROGRESS NOTE  Patient Care Team: [place] Pink, MD as PCP - General (Family Medicine)  Cancer Staging Cancer of lower lobe of right lung [facility]) Staging form: Lung, AJCC 7th Edition - Clinical: Stage IIIA (T3, N2, M0) - Signed by Forest Gleason, MD on 05/07/2015    Oncology History   # Squamous cell carcinoma of right LOWER LOBE OF lung stage IIIA based on PET scan Biopsy of the (August of 2016). 2. After 3 cycles of chemotherapy patient underwent resection of lung mass (November, 2016) ypT2B ypN0 [Dr.Oaks] status post right lower lobectomy with a 3. She was started on carboplatinum and Taxol on a weekly basis and radiation therapy from January of 2017 4. After 2 cycles of carboplatinum patient had neuropathy grade 2 interfering with work so chemotherapy was put on hold and radiation was continued (January 19th, 2017) 5. Chemotherapy was discontinued because of progressive neuropathy. Patient is finishing of radiation therapy on November 19, 2015  # NOV 2017- CT- 49mm right hilar LN; PET- Feb 2018- NED.   # DEC-JAN 2019- RUL Lung nodule [s/p Bronch; Dr.Kasa- non-diagnostic] SBRT Feb 2019     Cancer of lower lobe of right lung ([place])    INTERVAL HISTORY:  Claudia Chavez 62 y.o.  female pleasant patient above history of Stage III squamous cell lung cancer, here to follow-up results.  To see this patient today. Patient's medical oncology history review was performed. History of stage III squamous cell lung cancer status post chemotherapy and radiation, most recently status post SBRT right lung nodule in February 2019.Marland Kitchen  #Shortness of breath and cough, stable. #Fatigue, chronic, worse  #Neuropathy, stable.    Review of Systems  Constitutional: Positive for malaise/fatigue. Negative for chills, fever and weight loss.  HENT: Negative for congestion, ear discharge, ear pain, nosebleeds, sinus pain and sore throat.   Eyes: Negative for  double vision, photophobia, pain, discharge and redness.  Respiratory: Positive for cough. Negative for hemoptysis, sputum production, shortness of breath and wheezing.   Cardiovascular: Negative for chest pain, palpitations, orthopnea, claudication and leg swelling.  Gastrointestinal: Negative for abdominal pain, blood in stool, constipation, diarrhea, heartburn, melena, nausea and vomiting.  Genitourinary: Negative for dysuria, flank pain, frequency and hematuria.  Musculoskeletal: Negative for back pain, myalgias and neck pain.  Skin: Negative for itching and rash.  Neurological: Positive for tingling. Negative for dizziness, tremors, focal weakness, weakness and headaches.  Endo/Heme/Allergies: Negative for environmental allergies. Does not bruise/bleed easily.  Psychiatric/Behavioral: Negative for depression and hallucinations. The patient is not nervous/anxious.    PAST MEDICAL HISTORY :  Past Medical History:  Diagnosis Date  . Cancer of lower lobe of right lung ([place]) 05/07/2015  . COPD (chronic obstructive pulmonary disease) ([place])   . Non-small cell lung cancer ([place])   . Pneumonia 04/2015    PAST SURGICAL HISTORY :   Past Surgical History:  Procedure Laterality Date  . ABDOMINAL HYSTERECTOMY    . ELBOW SURGERY Right 1995  . ELECTROMAGNETIC NAVIGATION BROCHOSCOPY N/A 10/25/2017   Procedure: ELECTROMAGNETIC NAVIGATION BRONCHOSCOPY;  Surgeon: [name], MD;  Location: [facility] ORS;  Service: Cardiopulmonary;  Laterality: N/A;  . PORTACATH PLACEMENT Right 05/10/2015   Procedure: INSERTION PORT-A-CATH;  Surgeon: Nestor Lewandowsky, MD;  Location: [facility] ORS;  Service: General;  Laterality: Right;  Marland Kitchen VIDEO ASSISTED THORACOSCOPY (VATS)/THOROCOTOMY Right 08/18/2015   Procedure: VIDEO ASSISTED THORACOSCOPY (VATS)/THOROCOTOMY;  Surgeon: Nestor Lewandowsky, MD;  Location: [facility] ORS;  Service: General;  Laterality: Right;  FAMILY HISTORY :   Family History  Problem Relation Age of Onset  . Stroke Mother    . Lung cancer Father 65    SOCIAL HISTORY:   Social History   Tobacco Use  . Smoking status: Current Every Day Smoker    Packs/day: 0.50    Years: 40.00    Pack years: 20.00    Types: Cigarettes  . Smokeless tobacco: Never Used  . Tobacco comment: "1 pk or less per day"  Substance Use Topics  . Alcohol use: Yes    Alcohol/week: 1.2 - 2.4 oz    Types: 2 - 4 Cans of beer per week    Comment: beer-occasionally  . Drug use: No    ALLERGIES:  is allergic to lyrica [pregabalin].  MEDICATIONS:  Current Outpatient Medications  Medication Sig Dispense Refill  . albuterol (PROVENTIL HFA;VENTOLIN HFA) 108 (90 Base) MCG/ACT inhaler Inhale 1 puff into the lungs every 6 (six) hours as needed for wheezing or shortness of breath.    . benzonatate (TESSALON) 200 MG capsule Take by mouth.    . Cholecalciferol (VITAMIN D3) 1000 units CAPS Take by mouth 2 (two) times daily.    . fluticasone (FLONASE) 50 MCG/ACT nasal spray SPRAY 2 SPRAYS INTO EACH NOSTRIL EVERY DAY  1  . Fluticasone-Salmeterol (ADVAIR DISKUS) 250-50 MCG/DOSE AEPB INHALE 1 INHALATION INTO THE LUNGS EVERY 12 (TWELVE) HOURS.    Marland Kitchen ipratropium-albuterol (DUONEB) 0.5-2.5 (3) MG/3ML SOLN Take 3 mLs by nebulization every 4 (four) hours as needed. 360 mL 3  . predniSONE (DELTASONE) 20 MG tablet TAKE 2 PILLS ONCE A DAY 1 WEEK AND THEN 1 PILL ONCE A DAY FOR 2 WEEKS. TAKE IN IN MORNING WITH FOOD. 28 tablet 0  . vitamin B-12 (CYANOCOBALAMIN) 1000 MCG tablet Take 1,000 mcg by mouth daily.     No current facility-administered medications for this visit.    Facility-Administered Medications Ordered in Other Visits  Medication Dose Route Frequency Provider Last Rate Last Dose  . heparin lock flush 100 unit/mL  500 Units Intracatheter Once PRN Choksi, Delorise Shiner, MD      . heparin lock flush 100 unit/mL  500 Units Intravenous Once Charlaine Dalton R, MD      . sodium chloride flush (NS) 0.9 % injection 10 mL  10 mL Intravenous PRN Cammie Sickle, MD        PHYSICAL EXAMINATION: ECOG PERFORMANCE STATUS: 0 - Asymptomatic  BP (!) 153/54 (BP Location: Left Arm, Patient Position: Sitting)   Pulse 89   Temp 97.9 F (36.6 C) (Oral)   Resp 18   Wt 100 lb 3.2 oz (45.5 kg)   SpO2 95% Comment: room air  BMI 18.93 kg/m   Filed Weights   03/18/18 1118  Weight: 100 lb 3.2 oz (45.5 kg)    Physical Exam  Constitutional: She is oriented to person, place, and time and well-developed, well-nourished, and in no distress. No distress.  HENT:  Head: Normocephalic and atraumatic.  Nose: Nose normal.  Mouth/Throat: Oropharynx is clear and moist. No oropharyngeal exudate.  Eyes: Pupils are equal, round, and reactive to light. EOM are normal. Left eye exhibits no discharge. No scleral icterus.  Neck: Normal range of motion. Neck supple. No JVD present.  Cardiovascular: Normal rate, regular rhythm and normal heart sounds.  No murmur heard. Pulmonary/Chest: Effort normal. No respiratory distress. She has no wheezes. She has no rales. She exhibits no tenderness.  Decreased breath bilateral  Abdominal: Soft. She exhibits no distension  and no mass. There is no tenderness. There is no rebound.  Musculoskeletal: Normal range of motion. She exhibits no edema or tenderness.  Lymphadenopathy:    She has no cervical adenopathy.  Neurological: She is alert and oriented to person, place, and time. No cranial nerve deficit. She exhibits normal muscle tone. Coordination normal.  Skin: Skin is warm and dry. No rash noted. She is not diaphoretic. No erythema.  Psychiatric: Affect and judgment normal.    LABORATORY DATA:  I have reviewed the data as listed    Component Value Date/Time   NA 138 11/12/2017 1011   K 4.1 11/12/2017 1011   CL 109 11/12/2017 1011   CO2 26 11/12/2017 1011   GLUCOSE 68 11/12/2017 1011   BUN 11 11/12/2017 1011   CREATININE 0.50 03/15/2018 0923   CALCIUM 8.9 11/12/2017 1011   PROT 7.2 11/12/2017 1011   ALBUMIN  3.9 11/12/2017 1011   AST 22 11/12/2017 1011   ALT 16 11/12/2017 1011   ALKPHOS 119 11/12/2017 1011   BILITOT 0.5 11/12/2017 1011   GFRNONAA >60 11/12/2017 1011   GFRAA >60 11/12/2017 1011    No results found for: SPEP, UPEP  Lab Results  Component Value Date   WBC 6.4 11/12/2017   NEUTROABS 3.8 11/12/2017   HGB 13.0 11/12/2017   HCT 38.6 11/12/2017   MCV 104.5 (H) 11/12/2017   PLT 344 11/12/2017      Chemistry      Component Value Date/Time   NA 138 11/12/2017 1011   K 4.1 11/12/2017 1011   CL 109 11/12/2017 1011   CO2 26 11/12/2017 1011   BUN 11 11/12/2017 1011   CREATININE 0.50 03/15/2018 0923      Component Value Date/Time   CALCIUM 8.9 11/12/2017 1011   ALKPHOS 119 11/12/2017 1011   AST 22 11/12/2017 1011   ALT 16 11/12/2017 1011   BILITOT 0.5 11/12/2017 1011     RADIOGRAPHIC STUDIES: I have personally reviewed the radiological images as listed and agreed with the findings in the report. CT chest w contrast 03/15/2018 #Left upper lobe pulmonary nodule, is a concern.  Mental prominence from the lesion not excluded.  Right upper lobe nodule seen on prior study has become incorporated into an area of ill-defined interstitial and airspace disease in the right upper lung compatible with radiation treatment.  Mild progression of right hilar and mediastinal lymphadenopathy.  Close attention recommended as medicine disease is a concern.  Emphysema.  Aortic atherosclerosis   ASSESSMENT & PLAN:  1. Cancer of lower lobe of right lung [facility])    #Recent CT image results were reviewed and discussed with patient.  There is a concerning left upper nodule [employer].  There is also mild progression of right hilar and mediastinal lymphadenopathy.  Discussed with patient that I will present her case on tumor board this week.  She should follow-up with Dr. B in 1 week for discussion about future plans.  Case was discussed on tumor board. Consensus decision was to obtain PET to see  if any distant metastasis. If no, refer to pulmonary for evaluation of biopsy.   All questions were answered. The patient knows to call the clinic with any problems, questions or concerns.  Total face to face encounter time for this patient visit was 25 min. >50% of the time was  spent in counseling and coordination of care.     Earlie Server, MD 03/18/2018 9:25 PM

## 2018-03-18 NOTE — Progress Notes (Signed)
Patient here today for follow up. No concerns voiced.  °

## 2018-03-22 ENCOUNTER — Telehealth: Payer: Self-pay | Admitting: *Deleted

## 2018-03-22 NOTE — Telephone Encounter (Signed)
Pt discussed by Dr. Tasia Catchings at conference on 03/21/2018. Recent CT scan concerning for metastatic disease vs. new primary per discussion. Recommendation was for pt to have PET scan to evaluate area to biopsy. Per Dr. Alva Garnet, pt would need to be evaluated by Dr. Mortimer Fries to biopsy by bronchoscopy if PET did not show any areas outside the lungs amenable to percutaneous biopsy.   Concerns and recommendations from conference communicated to patient. Pt stated will be out of town next week so could not schedule appts at that time. Pt made aware of scheduled appts for PET on 7/1 at 8:30am, arrive at 8am. Prep instructions given to patient. Informed that will follow up with Dr. Jacinto Reap on Wed 7/3 at 11am to review results and discuss biopsy planning if needed.

## 2018-03-25 ENCOUNTER — Inpatient Hospital Stay: Payer: PPO | Admitting: Internal Medicine

## 2018-03-25 DIAGNOSIS — H40033 Anatomical narrow angle, bilateral: Secondary | ICD-10-CM | POA: Diagnosis not present

## 2018-03-25 DIAGNOSIS — H40003 Preglaucoma, unspecified, bilateral: Secondary | ICD-10-CM | POA: Diagnosis not present

## 2018-03-27 ENCOUNTER — Other Ambulatory Visit: Payer: PPO

## 2018-03-28 ENCOUNTER — Other Ambulatory Visit: Payer: Self-pay | Admitting: Internal Medicine

## 2018-03-29 ENCOUNTER — Ambulatory Visit: Payer: PPO | Admitting: Internal Medicine

## 2018-04-01 ENCOUNTER — Ambulatory Visit
Admission: RE | Admit: 2018-04-01 | Discharge: 2018-04-01 | Disposition: A | Discharge status: Home / Self Care | Payer: PPO | Source: Ambulatory Visit | Attending: Oncology | Admitting: Oncology

## 2018-04-01 DIAGNOSIS — Z923 Personal history of irradiation: Secondary | ICD-10-CM | POA: Insufficient documentation

## 2018-04-01 DIAGNOSIS — C3431 Malignant neoplasm of lower lobe, right bronchus or lung: Secondary | ICD-10-CM | POA: Diagnosis not present

## 2018-04-01 DIAGNOSIS — Z510 Encounter for antineoplastic radiation therapy: Secondary | ICD-10-CM | POA: Diagnosis not present

## 2018-04-01 DIAGNOSIS — C3490 Malignant neoplasm of unspecified part of unspecified bronchus or lung: Secondary | ICD-10-CM | POA: Diagnosis present

## 2018-04-01 LAB — GLUCOSE, CAPILLARY: Glucose-Capillary: 78 mg/dL (ref 70–99)

## 2018-04-01 MED ORDER — FLUDEOXYGLUCOSE F - 18 (FDG) INJECTION
5.8000 | Freq: Once | INTRAVENOUS | Status: AC | PRN
  Administered 2018-04-01: 5.8 via INTRAVENOUS

## 2018-04-03 ENCOUNTER — Inpatient Hospital Stay: Payer: PPO | Attending: Internal Medicine | Admitting: Internal Medicine

## 2018-04-03 ENCOUNTER — Encounter: Payer: Self-pay | Admitting: Internal Medicine

## 2018-04-03 VITALS — BP 122/77 | HR 112 | Temp 97.3°F | Resp 16 | Wt 101.0 lb

## 2018-04-03 DIAGNOSIS — J440 Chronic obstructive pulmonary disease with acute lower respiratory infection: Secondary | ICD-10-CM | POA: Diagnosis not present

## 2018-04-03 DIAGNOSIS — R911 Solitary pulmonary nodule: Secondary | ICD-10-CM

## 2018-04-03 DIAGNOSIS — J449 Chronic obstructive pulmonary disease, unspecified: Secondary | ICD-10-CM | POA: Diagnosis not present

## 2018-04-03 DIAGNOSIS — Z720 Tobacco use: Secondary | ICD-10-CM | POA: Diagnosis not present

## 2018-04-03 DIAGNOSIS — C3431 Malignant neoplasm of lower lobe, right bronchus or lung: Secondary | ICD-10-CM

## 2018-04-03 NOTE — Progress Notes (Signed)
[place] OFFICE PROGRESS NOTE  Patient Care Team: [place] Pink, MD as PCP - General (Family Medicine)  Cancer Staging Cancer of lower lobe of right lung [facility]) Staging form: Lung, AJCC 7th Edition - Clinical: Stage IIIA (T3, N2, M0) - Signed by Forest Gleason, MD on 05/07/2015    Oncology History   # Squamous cell carcinoma of right LOWER LOBE OF lung stage IIIA based on PET scan Biopsy of the (August of 2016). 2. After 3 cycles of chemotherapy patient underwent resection of lung mass (November, 2016) ypT2B ypN0 [Dr.Oaks] status post right lower lobectomy with a 3. She was started on carboplatinum and Taxol on a weekly basis and radiation therapy from January of 2017 4. After 2 cycles of carboplatinum patient had neuropathy grade 2 interfering with work so chemotherapy was put on hold and radiation was continued (January 19th, 2017) 5. Chemotherapy was discontinued because of progressive neuropathy. Patient is finishing of radiation therapy on November 19, 2015  # NOV 2017- CT- 21mm right hilar LN; PET- Feb 2018- NED.   # DEC-JAN 2019- RUL Lung nodule [s/p Bronch; Dr.Kasa- non-diagnostic] SBRT Feb 2019     Cancer of lower lobe of right lung ([place])      INTERVAL HISTORY:  Claudia Chavez 62 y.o.  female pleasant patient above history of lung cancer is here to review the results of her PET scan.  Patient finished radiation to her right upper lobe lung nodule suspicious for new primary in February 2019.  A CT scan done as a follow-up end of June 2019 showed cavitary/changes in the right upper lobe suggestive of radiation changes; however showed a new 5 mm left upper lobe lung nodule.   Patient denies any fevers or chills.  Denies any worsening shortness of breath or cough.  Patient has been using her nebulizer/inhalers.  Review of Systems  Constitutional: Negative for chills, diaphoresis, fever, malaise/fatigue and weight loss.  HENT: Negative for  nosebleeds and sore throat.   Eyes: Negative for double vision.  Respiratory: Positive for shortness of breath. Negative for cough, hemoptysis, sputum production and wheezing.   Cardiovascular: Negative for chest pain, palpitations, orthopnea and leg swelling.  Gastrointestinal: Negative for abdominal pain, blood in stool, constipation, diarrhea, heartburn, melena, nausea and vomiting.  Genitourinary: Negative for dysuria, frequency and urgency.  Musculoskeletal: Negative for back pain and joint pain.  Skin: Negative.  Negative for itching and rash.  Neurological: Negative for dizziness, tingling, focal weakness, weakness and headaches.  Endo/Heme/Allergies: Does not bruise/bleed easily.  Psychiatric/Behavioral: Negative for depression. The patient is not nervous/anxious and does not have insomnia.       PAST MEDICAL HISTORY :  Past Medical History:  Diagnosis Date  . Cancer of lower lobe of right lung ([place]) 05/07/2015  . COPD (chronic obstructive pulmonary disease) ([place])   . Non-small cell lung cancer ([place])   . Pneumonia 04/2015    PAST SURGICAL HISTORY :   Past Surgical History:  Procedure Laterality Date  . ABDOMINAL HYSTERECTOMY    . ELBOW SURGERY Right 1995  . ELECTROMAGNETIC NAVIGATION BROCHOSCOPY N/A 10/25/2017   Procedure: ELECTROMAGNETIC NAVIGATION BRONCHOSCOPY;  Surgeon: [name], MD;  Location: [facility] ORS;  Service: Cardiopulmonary;  Laterality: N/A;  . PORTACATH PLACEMENT Right 05/10/2015   Procedure: INSERTION PORT-A-CATH;  Surgeon: Nestor Lewandowsky, MD;  Location: [facility] ORS;  Service: General;  Laterality: Right;  Marland Kitchen VIDEO ASSISTED THORACOSCOPY (VATS)/THOROCOTOMY Right 08/18/2015   Procedure: VIDEO ASSISTED THORACOSCOPY (VATS)/THOROCOTOMY;  Surgeon: Nestor Lewandowsky, MD;  Location: [facility] ORS;  Service: General;  Laterality: Right;    FAMILY HISTORY :   Family History  Problem Relation Age of Onset  . Stroke Mother   . Lung cancer Father 44    SOCIAL HISTORY:   Social  History   Tobacco Use  . Smoking status: Current Every Day Smoker    Packs/day: 0.50    Years: 40.00    Pack years: 20.00    Types: Cigarettes  . Smokeless tobacco: Never Used  . Tobacco comment: "1 pk or less per day"  Substance Use Topics  . Alcohol use: Yes    Alcohol/week: 1.2 - 2.4 oz    Types: 2 - 4 Cans of beer per week    Comment: beer-occasionally  . Drug use: No    ALLERGIES:  is allergic to lyrica [pregabalin].  MEDICATIONS:  Current Outpatient Medications  Medication Sig Dispense Refill  . albuterol (PROVENTIL HFA;VENTOLIN HFA) 108 (90 Base) MCG/ACT inhaler Inhale 1 puff into the lungs every 6 (six) hours as needed for wheezing or shortness of breath.    . Cholecalciferol (VITAMIN D3) 1000 units CAPS Take by mouth 2 (two) times daily.    . fluticasone (FLONASE) 50 MCG/ACT nasal spray SPRAY 2 SPRAYS INTO EACH NOSTRIL EVERY DAY  1  . Fluticasone-Salmeterol (ADVAIR DISKUS) 250-50 MCG/DOSE AEPB INHALE 1 INHALATION INTO THE LUNGS EVERY 12 (TWELVE) HOURS.    Marland Kitchen ipratropium-albuterol (DUONEB) 0.5-2.5 (3) MG/3ML SOLN TAKE 3 MLS BY NEBULIZATION EVERY 4 (FOUR) HOURS AS NEEDED. 360 mL 3  . predniSONE (DELTASONE) 20 MG tablet TAKE 2 PILLS ONCE A DAY 1 WEEK AND THEN 1 PILL ONCE A DAY FOR 2 WEEKS. TAKE IN IN MORNING WITH FOOD. 28 tablet 0  . vitamin B-12 (CYANOCOBALAMIN) 1000 MCG tablet Take 1,000 mcg by mouth daily.     No current facility-administered medications for this visit.    Facility-Administered Medications Ordered in Other Visits  Medication Dose Route Frequency Provider Last Rate Last Dose  . heparin lock flush 100 unit/mL  500 Units Intracatheter Once PRN Choksi, Delorise Shiner, MD      . heparin lock flush 100 unit/mL  500 Units Intravenous Once Charlaine Dalton R, MD      . sodium chloride flush (NS) 0.9 % injection 10 mL  10 mL Intravenous PRN Cammie Sickle, MD        PHYSICAL EXAMINATION: ECOG PERFORMANCE STATUS: 0 - Asymptomatic  BP 122/77 (BP Location:  Left Arm, Patient Position: Sitting)   Pulse (!) 112   Temp (!) 97.3 F (36.3 C) (Tympanic)   Resp 16   Wt 101 lb (45.8 kg)   BMI 19.08 kg/m   Filed Weights   04/03/18 1107  Weight: 101 lb (45.8 kg)    GENERAL: Well-nourished well-developed; Alert, no distress and comfortable.  Patient is alone. EYES: no pallor or icterus OROPHARYNX: no thrush or ulceration; NECK: supple; no lymph nodes felt. LYMPH:  no palpable lymphadenopathy in the axillary or inguinal regions LUNGS: Decreased breath sounds auscultation bilaterally. No wheeze or crackles HEART/CVS: regular rate & rhythm and no murmurs; No lower extremity edema ABDOMEN:abdomen soft, non-tender and normal bowel sounds. No hepatomegaly or splenomegaly.  Musculoskeletal:no cyanosis of digits and no clubbing  PSYCH: alert & oriented x 3 with fluent speech NEURO: no focal motor/sensory deficits SKIN:  no rashes or significant lesions    LABORATORY DATA:  I have reviewed the data as listed    Component Value Date/Time   NA 138 11/12/2017  1011   K 4.1 11/12/2017 1011   CL 109 11/12/2017 1011   CO2 26 11/12/2017 1011   GLUCOSE 68 11/12/2017 1011   BUN 11 11/12/2017 1011   CREATININE 0.50 03/15/2018 0923   CALCIUM 8.9 11/12/2017 1011   PROT 7.2 11/12/2017 1011   ALBUMIN 3.9 11/12/2017 1011   AST 22 11/12/2017 1011   ALT 16 11/12/2017 1011   ALKPHOS 119 11/12/2017 1011   BILITOT 0.5 11/12/2017 1011   GFRNONAA >60 11/12/2017 1011   GFRAA >60 11/12/2017 1011    No results found for: SPEP, UPEP  Lab Results  Component Value Date   WBC 6.4 11/12/2017   NEUTROABS 3.8 11/12/2017   HGB 13.0 11/12/2017   HCT 38.6 11/12/2017   MCV 104.5 (H) 11/12/2017   PLT 344 11/12/2017      Chemistry      Component Value Date/Time   NA 138 11/12/2017 1011   K 4.1 11/12/2017 1011   CL 109 11/12/2017 1011   CO2 26 11/12/2017 1011   BUN 11 11/12/2017 1011   CREATININE 0.50 03/15/2018 0923      Component Value Date/Time    CALCIUM 8.9 11/12/2017 1011   ALKPHOS 119 11/12/2017 1011   AST 22 11/12/2017 1011   ALT 16 11/12/2017 1011   BILITOT 0.5 11/12/2017 1011       RADIOGRAPHIC STUDIES: I have personally reviewed the radiological images as listed and agreed with the findings in the report. No results found.   ASSESSMENT & PLAN:  Cancer of lower lobe of right lung ([place]) #Right upper lobe stage I lung cancer [biopsy nondiagnostic]- s/p feb 22nd SBRT; CT scan suggestive of radiation changes; PET scan April 01, 2018 also suggestive of radiation changes; less likely recurrence.  We will reviewed the tumor conference.  # # Stage III lung cancer stable.  No evidence of recurrence on the CT scan/PET scan.  #Left upper lobe 5 mm nodule -new primary versus metastasis versus others.  Too small to be evaluated in the PET scan.  Discussed with the patient that we will plan to proceed with a CT scan in approximately 3 months.  #COPD-chronic stable.  Continue breathing treatments.  # Post thoracotomy syndrome-stable.  #Continue port flush;   #Follow-up in 3 months; labs CT scan. Will review with Dr.Crystal/mdt.   # I reviewed the blood work- with the patient in detail; also reviewed the imaging independently [as summarized above]; and with the patient in detail.     Orders Placed This Encounter  Procedures  . CT CHEST W CONTRAST    Standing Status:   Future    Standing Expiration Date:   04/04/2019    Order Specific Question:   If indicated for the ordered procedure, I authorize the administration of contrast media per Radiology protocol    Answer:   Yes    Order Specific Question:   Preferred imaging location?    Answer:   [facility]    Order Specific Question:   Radiology Contrast Protocol - do NOT remove file path    Answer:   [email or web address]    Order Specific Question:   ** REASON FOR EXAM (FREE TEXT)    Answer:   lung cancer s/p RT RUL lung nodule  . CBC with  Differential/Platelet    Standing Status:   Future    Standing Expiration Date:   04/04/2019  . Comprehensive metabolic panel    Standing Status:   Future    Standing Expiration  Date:   04/04/2019   All questions were answered. The patient knows to call the clinic with any problems, questions or concerns.      Cammie Sickle, MD 04/04/2018 2:19 PM

## 2018-04-03 NOTE — Assessment & Plan Note (Addendum)
#  Right upper lobe stage I lung cancer [biopsy nondiagnostic]- s/p feb 22nd SBRT; CT scan suggestive of radiation changes; PET scan April 01, 2018 also suggestive of radiation changes; less likely recurrence.  We will reviewed the tumor conference.  # # Stage III lung cancer stable.  No evidence of recurrence on the CT scan/PET scan.  #Left upper lobe 5 mm nodule -new primary versus metastasis versus others.  Too small to be evaluated in the PET scan.  Discussed with the patient that we will plan to proceed with a CT scan in approximately 3 months.  #COPD-chronic stable.  Continue breathing treatments.  # Post thoracotomy syndrome-stable.  #Continue port flush;   #Follow-up in 3 months; labs CT scan. Will review with Dr.Crystal/mdt.   # I reviewed the blood work- with the patient in detail; also reviewed the imaging independently [as summarized above]; and with the patient in detail.

## 2018-04-12 ENCOUNTER — Telehealth: Payer: Self-pay | Admitting: Internal Medicine

## 2018-04-12 NOTE — Telephone Encounter (Signed)
Claudia Chavez- please inform pt that her scan was reviewed at the tumor conference; and felt that the changes on the CT scan was suggestive of radiation, less likely from cancer.  Plan is to do a follow-up CT scan in 3 months/as already recommended-ordered.  No new recommendations; patient to call us sooner if she is symptomatic.  Thx. GB

## 2018-04-15 NOTE — Telephone Encounter (Signed)
Pt made aware of recommendations.

## 2018-04-22 ENCOUNTER — Encounter: Payer: Self-pay | Admitting: Radiation Oncology

## 2018-04-22 ENCOUNTER — Ambulatory Visit
Admission: RE | Admit: 2018-04-22 | Discharge: 2018-04-22 | Disposition: A | Discharge status: Home / Self Care | Payer: PPO | Source: Ambulatory Visit | Attending: Radiation Oncology | Admitting: Radiation Oncology

## 2018-04-22 ENCOUNTER — Other Ambulatory Visit: Payer: Self-pay

## 2018-04-22 VITALS — BP 143/87 | HR 102 | Temp 96.0°F | Wt 99.9 lb

## 2018-04-22 DIAGNOSIS — R918 Other nonspecific abnormal finding of lung field: Secondary | ICD-10-CM | POA: Diagnosis not present

## 2018-04-22 DIAGNOSIS — J449 Chronic obstructive pulmonary disease, unspecified: Secondary | ICD-10-CM | POA: Diagnosis not present

## 2018-04-22 DIAGNOSIS — F17210 Nicotine dependence, cigarettes, uncomplicated: Secondary | ICD-10-CM | POA: Diagnosis not present

## 2018-04-22 DIAGNOSIS — Z923 Personal history of irradiation: Secondary | ICD-10-CM | POA: Diagnosis not present

## 2018-04-22 DIAGNOSIS — C3431 Malignant neoplasm of lower lobe, right bronchus or lung: Secondary | ICD-10-CM | POA: Insufficient documentation

## 2018-04-22 DIAGNOSIS — I8002 Phlebitis and thrombophlebitis of superficial vessels of left lower extremity: Secondary | ICD-10-CM | POA: Diagnosis not present

## 2018-04-22 DIAGNOSIS — Z85118 Personal history of other malignant neoplasm of bronchus and lung: Secondary | ICD-10-CM | POA: Diagnosis not present

## 2018-04-22 NOTE — Progress Notes (Signed)
Radiation Oncology Follow up Note  Name: Claudia Chavez   Date:   04/22/2018 MRN:  [id number] DOB: 11/10/1955    This 62 y.o. female presents to the clinic today for four-month follow-up status post concurrent chemoradiation therapy for squamous cell carcinoma theright upper lobe.  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 She recently completed radiation therapy for a right upper lobe lesion thought to be a new primary.she had a recent PET CT scan showing changes suggestive of radiation in her right upper lobe as well as some hypermetabolic activity in the right lower lobe also consistent with radiation changes. She also had a new left upper lobe 5 mm nodule which is being followed. Follow-up CT scan in 3 months is been ordered. She continues to do well. She does have COPD emphysema. She specifically denies cough hemoptysis or chest tightness.  COMPLICATIONS OF TREATMENT: none  FOLLOW UP COMPLIANCE: keeps appointments   PHYSICAL EXAM:  BP (!) 143/87   Pulse (!) 102   Temp (!) 96 F (35.6 C)   Wt 99 lb 13.9 oz (45.3 kg)   BMI 18.87 kg/m  Thin slightly cachectic female in NAD.Well-developed well-nourished patient in NAD. HEENT reveals PERLA, EOMI, discs not visualized.  Oral cavity is clear. No oral mucosal lesions are identified. Neck is clear without evidence of cervical or supraclavicular adenopathy. Lungs are clear to A&P. Cardiac examination is essentially unremarkable with regular rate and rhythm without murmur rub or thrill. Abdomen is benign with no organomegaly or masses noted. Motor sensory and DTR levels are equal and symmetric in the upper and lower extremities. Cranial nerves II through XII are grossly intact. Proprioception is intact. No peripheral adenopathy or edema is identified. No motor or  sensory levels are noted. Crude visual fields are within normal range.  RADIOLOGY RESULTS: PET CT scan is reviewed and compatible above-stated findings  PLAN: at this time we'll continue to follow the patient in 6 month intervals. Should the lesion in the left upper lobe progress may offer SB RT to that lesion also. Other changes I agree are consistent with radiation changes. Patient knows to call with any concerns. She continues close follow-up care with medical oncology.Patient is to call with any concerns at any time.  I would like to take this opportunity to thank you for allowing me to participate in the care of your patient.Noreene Filbert, MD

## 2018-05-07 ENCOUNTER — Telehealth: Payer: Self-pay | Admitting: *Deleted

## 2018-05-07 DIAGNOSIS — R0602 Shortness of breath: Secondary | ICD-10-CM

## 2018-05-07 DIAGNOSIS — M79662 Pain in left lower leg: Secondary | ICD-10-CM

## 2018-05-07 DIAGNOSIS — M7989 Other specified soft tissue disorders: Secondary | ICD-10-CM

## 2018-05-07 NOTE — Telephone Encounter (Signed)
Pt called in to report that has experienced left leg swelling that has been getting worse. Also started to experience shortness of breath with syncopal episodes. States was recently evaluated at PCP clinic and diagnosed with thrombophlebitis but did not have an ultrasound performed at that time. Per Sonia Baller, NP pt can get CTA and ultrasound of LLE tomorrow and be seen in [facility] tomorrow as well. Pt given appt times for ultrasound and CT scan. Appt given for pt to be seen in [facility] with labs prior. Pt verbalized understanding.

## 2018-05-08 ENCOUNTER — Inpatient Hospital Stay: Payer: PPO

## 2018-05-08 ENCOUNTER — Ambulatory Visit
Admission: RE | Admit: 2018-05-08 | Discharge: 2018-05-08 | Disposition: A | Discharge status: Home / Self Care | Payer: PPO | Source: Ambulatory Visit | Attending: Oncology | Admitting: Oncology

## 2018-05-08 ENCOUNTER — Encounter: Payer: Self-pay | Admitting: Oncology

## 2018-05-08 ENCOUNTER — Ambulatory Visit: Payer: PPO

## 2018-05-08 ENCOUNTER — Other Ambulatory Visit
Admission: RE | Admit: 2018-05-08 | Discharge: 2018-05-08 | Disposition: A | Discharge status: Home / Self Care | Payer: PPO | Source: Ambulatory Visit | Attending: Oncology | Admitting: Oncology

## 2018-05-08 ENCOUNTER — Inpatient Hospital Stay: Payer: PPO | Attending: Oncology | Admitting: Oncology

## 2018-05-08 ENCOUNTER — Other Ambulatory Visit: Payer: Self-pay

## 2018-05-08 VITALS — BP 130/79 | HR 94 | Temp 98.7°F | Resp 18 | Wt 100.1 lb

## 2018-05-08 DIAGNOSIS — R0602 Shortness of breath: Secondary | ICD-10-CM | POA: Diagnosis not present

## 2018-05-08 DIAGNOSIS — D649 Anemia, unspecified: Secondary | ICD-10-CM | POA: Insufficient documentation

## 2018-05-08 DIAGNOSIS — I82492 Acute embolism and thrombosis of other specified deep vein of left lower extremity: Secondary | ICD-10-CM | POA: Diagnosis not present

## 2018-05-08 DIAGNOSIS — R55 Syncope and collapse: Secondary | ICD-10-CM | POA: Diagnosis not present

## 2018-05-08 DIAGNOSIS — Z720 Tobacco use: Secondary | ICD-10-CM | POA: Diagnosis not present

## 2018-05-08 DIAGNOSIS — G939 Disorder of brain, unspecified: Secondary | ICD-10-CM | POA: Insufficient documentation

## 2018-05-08 DIAGNOSIS — I639 Cerebral infarction, unspecified: Secondary | ICD-10-CM | POA: Insufficient documentation

## 2018-05-08 DIAGNOSIS — J449 Chronic obstructive pulmonary disease, unspecified: Secondary | ICD-10-CM | POA: Diagnosis not present

## 2018-05-08 DIAGNOSIS — R911 Solitary pulmonary nodule: Secondary | ICD-10-CM | POA: Insufficient documentation

## 2018-05-08 DIAGNOSIS — Z452 Encounter for adjustment and management of vascular access device: Secondary | ICD-10-CM | POA: Insufficient documentation

## 2018-05-08 DIAGNOSIS — I700 Atherosclerosis of aorta: Secondary | ICD-10-CM | POA: Diagnosis not present

## 2018-05-08 DIAGNOSIS — J439 Emphysema, unspecified: Secondary | ICD-10-CM | POA: Diagnosis not present

## 2018-05-08 DIAGNOSIS — I959 Hypotension, unspecified: Secondary | ICD-10-CM | POA: Insufficient documentation

## 2018-05-08 DIAGNOSIS — M79662 Pain in left lower leg: Secondary | ICD-10-CM

## 2018-05-08 DIAGNOSIS — I471 Supraventricular tachycardia: Secondary | ICD-10-CM | POA: Insufficient documentation

## 2018-05-08 DIAGNOSIS — M7989 Other specified soft tissue disorders: Secondary | ICD-10-CM | POA: Insufficient documentation

## 2018-05-08 DIAGNOSIS — C3431 Malignant neoplasm of lower lobe, right bronchus or lung: Secondary | ICD-10-CM | POA: Insufficient documentation

## 2018-05-08 DIAGNOSIS — Z01810 Encounter for preprocedural cardiovascular examination: Secondary | ICD-10-CM | POA: Diagnosis not present

## 2018-05-08 DIAGNOSIS — I82432 Acute embolism and thrombosis of left popliteal vein: Secondary | ICD-10-CM | POA: Diagnosis not present

## 2018-05-08 DIAGNOSIS — R531 Weakness: Secondary | ICD-10-CM | POA: Insufficient documentation

## 2018-05-08 DIAGNOSIS — R112 Nausea with vomiting, unspecified: Secondary | ICD-10-CM | POA: Insufficient documentation

## 2018-05-08 DIAGNOSIS — I82442 Acute embolism and thrombosis of left tibial vein: Secondary | ICD-10-CM | POA: Diagnosis not present

## 2018-05-08 DIAGNOSIS — I2510 Atherosclerotic heart disease of native coronary artery without angina pectoris: Secondary | ICD-10-CM | POA: Insufficient documentation

## 2018-05-08 LAB — CBC WITH DIFFERENTIAL/PLATELET
BASOS ABS: 0.1 10*3/uL (ref 0–0.1)
BASOS PCT: 1 %
Eosinophils Absolute: 0.1 10*3/uL (ref 0–0.7)
Eosinophils Relative: 1 %
HCT: 36 % (ref 35.0–47.0)
HEMOGLOBIN: 12.3 g/dL (ref 12.0–16.0)
Lymphocytes Relative: 12 %
Lymphs Abs: 0.9 10*3/uL — ABNORMAL LOW (ref 1.0–3.6)
MCH: 34.5 pg — ABNORMAL HIGH (ref 26.0–34.0)
MCHC: 34.2 g/dL (ref 32.0–36.0)
MCV: 100.9 fL — ABNORMAL HIGH (ref 80.0–100.0)
Monocytes Absolute: 0.8 10*3/uL (ref 0.2–0.9)
Monocytes Relative: 10 %
NEUTROS ABS: 5.9 10*3/uL (ref 1.4–6.5)
NEUTROS PCT: 76 %
Platelets: 337 10*3/uL (ref 150–440)
RBC: 3.56 MIL/uL — ABNORMAL LOW (ref 3.80–5.20)
RDW: 13 % (ref 11.5–14.5)
WBC: 7.7 10*3/uL (ref 3.6–11.0)

## 2018-05-08 LAB — COMPREHENSIVE METABOLIC PANEL
ALT: 13 U/L (ref 0–44)
ANION GAP: 13 (ref 5–15)
AST: 18 U/L (ref 15–41)
Albumin: 2.9 g/dL — ABNORMAL LOW (ref 3.5–5.0)
Alkaline Phosphatase: 187 U/L — ABNORMAL HIGH (ref 38–126)
BUN: 10 mg/dL (ref 8–23)
CALCIUM: 8.4 mg/dL — AB (ref 8.9–10.3)
CO2: 22 mmol/L (ref 22–32)
Chloride: 97 mmol/L — ABNORMAL LOW (ref 98–111)
Creatinine, Ser: 0.6 mg/dL (ref 0.44–1.00)
GFR calc non Af Amer: >60 mL/min (ref >60)
Glucose, Bld: 103 mg/dL — ABNORMAL HIGH (ref 70–99)
POTASSIUM: 3.8 mmol/L (ref 3.5–5.1)
SODIUM: 132 mmol/L — AB (ref 135–145)
Total Bilirubin: 0.6 mg/dL (ref 0.3–1.2)
Total Protein: 7.1 g/dL (ref 6.5–8.1)

## 2018-05-08 MED ORDER — RIVAROXABAN (XARELTO) VTE STARTER PACK (15 & 20 MG): ORAL_TABLET | ORAL | 0 refills | Status: DC

## 2018-05-08 MED ORDER — IOPAMIDOL (ISOVUE-370) INJECTION 76%
75.0000 mL | Freq: Once | INTRAVENOUS | Status: AC | PRN
  Administered 2018-05-08: 75 mL via INTRAVENOUS

## 2018-05-08 NOTE — Progress Notes (Signed)
Symptom Management Consult note [facility]  Telephone:([phone] [phone]  Patient Care Team: Maryland Pink, MD as PCP - General (Family Medicine)   Name of the patient: Claudia Chavez  [id number]  Sep 13, 1956   Date of visit: 05/08/18  Diagnosis-stage III lung cancer  Chief complaint/ Reason for visit- Left Leg swelling/ SOB and syncope  Heme/Onc history: Patient was last seen by Dr. Rogue Bussing on 04/03/2018 to review her PET scan.  PET revealed a left upper lobe 5 mm nodule.  It was too small to be evaluated on PET.  Plan is to re-evaluate with CT scan in approximately 3 months.   Oncology History   # Squamous cell carcinoma of right LOWER LOBE OF lung stage IIIA based on PET scan Biopsy of the (August of 2016). 2. After 3 cycles of chemotherapy patient underwent resection of lung mass (November, 2016) ypT2B ypN0 [Dr.Oaks] status post right lower lobectomy with a 3. She was started on carboplatinum and Taxol on a weekly basis and radiation therapy from January of 2017 4. After 2 cycles of carboplatinum patient had neuropathy grade 2 interfering with work so chemotherapy was put on hold and radiation was continued (January 19th, 2017) 5. Chemotherapy was discontinued because of progressive neuropathy. Patient is finishing of radiation therapy on November 19, 2015  # NOV 2017- CT- 67mm right hilar LN; PET- Feb 2018- NED.   # DEC-JAN 2019- RUL Lung nodule [s/p Bronch; Dr.Kasa- non-diagnostic] SBRT Feb 2019     Cancer of lower lobe of right lung ([place])     Interval history-  REAGANN DOLCE reports symptoms of swelling and pain of theleft leg(s). Symptoms have been present for 7 day(s) She has not had similar problems in the past.  The patient is able to ambulate.  Risk factors for hypercoaguable state inlcude:  cancer. She was seen and evaluated by PCP on 04/22/2018 for left lower leg swelling, redness and pain.  Thought to be  thrombophlebitis of superficial veins of left lower extremity.  Recommendation was to take anti-inflammatories, apply ice or heat and wear compression stockings.  She never had follow-up.  Patient complains of shortness of breath at rest, after one flight stairs.  Symptoms include shortness of breath. Symptoms began 2 weeks ago, stable since that time.  Patient denies difficulty breathing, post nasal drip, sputum production and wheezing. Associated symptoms include Orthopnea. Patient has not had recent travel.  Weight has been stable.  Appetite has been decreased. Symptoms are exacerbated by any exercise. Symptoms are alleviated by rest.   Patient complains of near syncope. Onset was 2 weeks ago, with stable course since that time. Patient describes the episode as never actually lost consciousness, had precursor symptoms only, including feeling of almost losing consciousness, severe lightheadedness. Patient also has associated symptoms of  tachycardia/palpitations. The patient denies abdominal pain, diarrhea and headache. Taking culprit meds?: none   ECOG FS:1 - Symptomatic but completely ambulatory  Review of systems- Review of Systems  Constitutional: Positive for malaise/fatigue. Negative for chills, fever and weight loss.  HENT: Negative for congestion and ear pain.   Eyes: Negative.  Negative for blurred vision and double vision.  Respiratory: Positive for shortness of breath. Negative for cough and sputum production.   Cardiovascular: Positive for palpitations and leg swelling. Negative for chest pain.       Orthopnea  Gastrointestinal: Negative.  Negative for abdominal pain, constipation, diarrhea, nausea and vomiting.  Genitourinary: Negative for dysuria, frequency and  urgency.  Musculoskeletal: Negative for back pain and falls.  Skin: Negative.  Negative for rash.  Neurological: Negative.  Negative for weakness and headaches.  Endo/Heme/Allergies: Negative.  Does not bruise/bleed  easily.  Psychiatric/Behavioral: Negative.  Negative for depression. The patient is not nervous/anxious and does not have insomnia.      Current treatment- s/p SBRT.   Allergies  Allergen Reactions  . Lyrica [Pregabalin] Other (See Comments)    Made patient feel very dizzy and not feel good.      Past Medical History:  Diagnosis Date  . Cancer of lower lobe of right lung ([place]) 05/07/2015  . COPD (chronic obstructive pulmonary disease) ([place])   . Non-small cell lung cancer ([place])   . Pneumonia 04/2015     Past Surgical History:  Procedure Laterality Date  . ABDOMINAL HYSTERECTOMY    . ELBOW SURGERY Right 1995  . ELECTROMAGNETIC NAVIGATION BROCHOSCOPY N/A 10/25/2017   Procedure: ELECTROMAGNETIC NAVIGATION BRONCHOSCOPY;  Surgeon: [name], MD;  Location: [facility] ORS;  Service: Cardiopulmonary;  Laterality: N/A;  . PORTACATH PLACEMENT Right 05/10/2015   Procedure: INSERTION PORT-A-CATH;  Surgeon: Nestor Lewandowsky, MD;  Location: [facility] ORS;  Service: General;  Laterality: Right;  Marland Kitchen VIDEO ASSISTED THORACOSCOPY (VATS)/THOROCOTOMY Right 08/18/2015   Procedure: VIDEO ASSISTED THORACOSCOPY (VATS)/THOROCOTOMY;  Surgeon: Nestor Lewandowsky, MD;  Location: [facility] ORS;  Service: General;  Laterality: Right;    Social History   Socioeconomic History  . Marital status: Widowed    Spouse name: Not on file  . Number of children: Not on file  . Years of education: Not on file  . Highest education level: Not on file  Occupational History  . Not on file  Social Needs  . Financial resource strain: Not on file  . Food insecurity:    Worry: Not on file    Inability: Not on file  . Transportation needs:    Medical: Not on file    Non-medical: Not on file  Tobacco Use  . Smoking status: Current Every Day Smoker    Packs/day: 0.50    Years: 40.00    Pack years: 20.00    Types: Cigarettes  . Smokeless tobacco: Never Used  . Tobacco comment: "1 pk or less per day"  Substance and Sexual Activity  .  Alcohol use: Yes    Alcohol/week: 2.0 - 4.0 standard drinks    Types: 2 - 4 Cans of beer per week    Comment: beer-occasionally  . Drug use: No  . Sexual activity: Never  Lifestyle  . Physical activity:    Days per week: Not on file    Minutes per session: Not on file  . Stress: Not on file  Relationships  . Social connections:    Talks on phone: Not on file    Gets together: Not on file    Attends religious service: Not on file    Active member of club or organization: Not on file    Attends meetings of clubs or organizations: Not on file    Relationship status: Not on file  . Intimate partner violence:    Fear of current or ex partner: Not on file    Emotionally abused: Not on file    Physically abused: Not on file    Forced sexual activity: Not on file  Other Topics Concern  . Not on file  Social History Narrative  . Not on file    Family History  Problem Relation Age of Onset  . Stroke Mother   .  Lung cancer Father 37     Current Outpatient Medications:  .  albuterol (PROVENTIL HFA;VENTOLIN HFA) 108 (90 Base) MCG/ACT inhaler, Inhale 1 puff into the lungs every 6 (six) hours as needed for wheezing or shortness of breath., Disp: , Rfl:  .  Cholecalciferol (VITAMIN D3) 1000 units CAPS, Take by mouth 2 (two) times daily., Disp: , Rfl:  .  fluticasone (FLONASE) 50 MCG/ACT nasal spray, SPRAY 2 SPRAYS INTO EACH NOSTRIL EVERY DAY, Disp: , Rfl: 1 .  Fluticasone-Salmeterol (ADVAIR DISKUS) 250-50 MCG/DOSE AEPB, INHALE 1 INHALATION INTO THE LUNGS EVERY 12 (TWELVE) HOURS., Disp: , Rfl:  .  ipratropium-albuterol (DUONEB) 0.5-2.5 (3) MG/3ML SOLN, TAKE 3 MLS BY NEBULIZATION EVERY 4 (FOUR) HOURS AS NEEDED., Disp: 360 mL, Rfl: 3 .  vitamin B-12 (CYANOCOBALAMIN) 1000 MCG tablet, Take 1,000 mcg by mouth daily., Disp: , Rfl:  .  Rivaroxaban 15 & 20 MG TBPK, Take as directed on package, Disp: 51 each, Rfl: 0 No current facility-administered medications for this visit.    Facility-Administered Medications Ordered in Other Visits:  .  heparin lock flush 100 unit/mL, 500 Units, Intracatheter, Once PRN, Choksi, Janak, MD .  heparin lock flush 100 unit/mL, 500 Units, Intravenous, Once, Brahmanday, Govinda R, MD .  sodium chloride flush (NS) 0.9 % injection 10 mL, 10 mL, Intravenous, PRN, Cammie Sickle, MD  Physical exam:  Vitals:   05/08/18 1343  BP: 130/79  Pulse: 94  Resp: 18  Temp: 98.7 F (37.1 C)  TempSrc: Tympanic  SpO2: 97%  Weight: 100 lb 1.6 oz (45.4 kg)   Physical Exam  Constitutional: She is oriented to person, place, and time. Vital signs are normal. She appears well-developed and well-nourished.  HENT:  Head: Normocephalic and atraumatic.  Eyes: Pupils are equal, round, and reactive to light.  Neck: Normal range of motion.  Cardiovascular: Normal rate, regular rhythm, normal heart sounds and normal pulses.  No murmur heard. Pulmonary/Chest: Effort normal and breath sounds normal. She has no wheezes.  Abdominal: Soft. Normal appearance and bowel sounds are normal. She exhibits no distension. There is no tenderness.  Musculoskeletal: Normal range of motion. She exhibits no edema.       Left knee: She exhibits swelling.       Left ankle: She exhibits swelling.  Obvious swelling and redness to left lower extremity. Tender to touch.  Neurological: She is alert and oriented to person, place, and time.  Skin: Skin is warm and dry. No rash noted.  Psychiatric: Judgment normal.     CMP Latest Ref Rng & Units 05/08/2018  Glucose 70 - 99 mg/dL 103(H)  BUN 8 - 23 mg/dL 10  Creatinine 0.44 - 1.00 mg/dL 0.60  Sodium 135 - 145 mmol/L 132(L)  Potassium 3.5 - 5.1 mmol/L 3.8  Chloride 98 - 111 mmol/L 97(L)  CO2 22 - 32 mmol/L 22  Calcium 8.9 - 10.3 mg/dL 8.4(L)  Total Protein 6.5 - 8.1 g/dL 7.1  Total Bilirubin 0.3 - 1.2 mg/dL 0.6  Alkaline Phos 38 - 126 U/L 187(H)  AST 15 - 41 U/L 18  ALT 0 - 44 U/L 13   CBC Latest Ref Rng & Units  05/08/2018  WBC 3.6 - 11.0 K/uL 7.7  Hemoglobin 12.0 - 16.0 g/dL 12.3  Hematocrit 35.0 - 47.0 % 36.0  Platelets 150 - 440 K/uL 337    No images are attached to the encounter.  Ct Angio Chest Pe W Or Wo Contrast  Result Date: 05/08/2018 CLINICAL DATA:  Shortness of breath. History of right-sided lung carcinoma EXAM: CT ANGIOGRAPHY CHEST WITH CONTRAST TECHNIQUE: Multidetector CT imaging of the chest was performed using the standard protocol during bolus administration of intravenous contrast. Multiplanar CT image reconstructions and MIPs were obtained to evaluate the vascular anatomy. CONTRAST:  5mL ISOVUE-370 IOPAMIDOL (ISOVUE-370) INJECTION 76% COMPARISON:  PET-CT examination April 01, 2018. FINDINGS: Cardiovascular: There is no demonstrable pulmonary embolus. There is no thoracic aortic aneurysm or dissection. There are foci of calcification in the proximal visualized great vessels. Visualized great vessels otherwise appear unremarkable. There is aortic atherosclerosis. There are foci of coronary artery calcification. There is no pericardial effusion or pericardial thickening evident. Port-A-Cath tip is in the superior vena cava. Mediastinum/Nodes: Thyroid appears normal. There is a right hilar lymph node measuring 1.3 x 1.3 cm, minimally smaller than on previous study. There are scattered subcentimeter mediastinal lymph nodes without progression from prior recent studies. No esophageal lesions are evident. Lungs/Pleura: Underlying centrilobular emphysema is evident. There is increase in consolidation throughout the posterior segment of the right upper lobe. There may well be underlying residual mass in this area. Well-defined mass is difficult to discern from the consolidation. Well-defined cavitation not seen on this current examination. There is also extension of consolidation into a portion of the superior segment right lower lobe. The previously noted 6 mm nodular opacity in the posterior segment of the  left upper lobe is seen on axial slice 28 series 9 but is marginally smaller at this time. No new parenchymal nodular opacities are evident. There is atelectatic change in the right middle lobe. No pleural effusion or pleural thickening evident. Upper Abdomen: Visualized upper abdominal structures appear normal except for atherosclerotic calcification in both proximal renal arteries. Musculoskeletal: There are no evident blastic or lytic bone lesions. No chest wall lesion evident. Review of the MIP images confirms the above findings. IMPRESSION: 1. No demonstrable pulmonary embolus. There is no thoracic aortic aneurysm or dissection. There is aortic atherosclerosis. There are great vessel and coronary artery calcifications. 2. Increase in consolidation in the posterior segment right upper lobe with extension into a portion of the superior segment right lower lobe. Well-defined mass not seen. Mass could easily be obscured by this degree of consolidation. No well-defined cavitary lesion evident currently. 3. Previously noted enlarged right hilar lymph node is slightly smaller compared to recent studies. No new adenopathy evident. 5. Marginally smaller subcentimeter nodular lesion posterior segment left upper lobe. No new nodular lesions are evident. 6.  Underlying emphysematous change. Aortic Atherosclerosis (ICD10-I70.0) and Emphysema (ICD10-J43.9). Electronically Signed   By: Lowella Grip III M.D.   On: 05/08/2018 12:33   US Venous Img Lower Unilateral Left  Result Date: 05/08/2018 CLINICAL DATA:  Left lower extremity pain and edema. History of smoking. History of lung cancer. Evaluate for DVT. EXAM: LEFT LOWER EXTREMITY VENOUS DOPPLER ULTRASOUND TECHNIQUE: Gray-scale sonography with graded compression, as well as color Doppler and duplex ultrasound were performed to evaluate the lower extremity deep venous systems from the level of the common femoral vein and including the common femoral, femoral, profunda  femoral, popliteal and calf veins including the posterior tibial, peroneal and gastrocnemius veins when visible. The superficial great saphenous vein was also interrogated. Spectral Doppler was utilized to evaluate flow at rest and with distal augmentation maneuvers in the common femoral, femoral and popliteal veins. COMPARISON:  Bilateral lower extremity venous Doppler ultrasound - 03/06/2015 FINDINGS: Contralateral Common Femoral Vein: Respiratory phasicity is normal and symmetric with the symptomatic side.  No evidence of thrombus. Normal compressibility. Common Femoral Vein: No evidence of thrombus. Normal compressibility, respiratory phasicity and response to augmentation. Saphenofemoral Junction: No evidence of thrombus. Normal compressibility and flow on color Doppler imaging. Profunda Femoral Vein: No evidence of thrombus. Normal compressibility and flow on color Doppler imaging. Femoral Vein: No evidence of thrombus. Normal compressibility, respiratory phasicity and response to augmentation. Popliteal Vein: There is hypoechoic occlusive thrombus within the popliteal vein (through there is hypoechoic occlusive thrombus within the popliteal vein (images 30 - 32). Calf Veins: There is hypoechoic occlusive thrombus within both paired left posterior tibial and peroneal veins (images 33 and 36). Superficial Great Saphenous Vein: There is hypoechoic occlusive thrombus within the greater saphenous vein extending from the proximal thigh (image 37 through the level of the knee (image 39). Venous Reflux:  None. Other Findings:  None. IMPRESSION: 1. Examination is positive for occlusive DVT involving the popliteal both paired left posterior tibial and peroneal veins. 2. Examination is positive for occlusive SVT involving the left greater saphenous vein. Electronically Signed   By: Sandi Mariscal M.D.   On: 05/08/2018 12:11     Assessment and plan- Patient is a 62 y.o. female who presents with intermittent shortness of  breath, orthopnea, near syncopal episodes and left lower extremity swelling and pain.  1.  Stage III lung cancer: S/p Lobectomy in 2016 by Dr. Genevive Bi.  Completed 2 cycles carbo/Taxol and discontinued due to fatigue that was interfering with her job and PN.  S/p SBRT completed 2017.  Most recent scans revealed RUL lung nodule-had bronch that was nondiagnostic.  Currently under surveillance.  Scheduled to return to clinic late September for repeat imaging and to see Dr. Rogue Bussing on 07/03/2018 for further assessment.  2.  Left lower leg pain and swelling: Stat ultrasound of the left lower extremity.  Positive for occlusive DVT involving the popliteal and both left posterior tibial and peroneal veins.  Positive for occlusive DVT involving the left greater saphenous vein.  Xarelto starter pack sent to pharmacy.  Patient to stay on this medication for at least 3 months.  Unsure of precipitating factor.  Denies DVTs in the past.  Inactivity?    3. SOB/Orthopnea/Near Syncopal episodes: STAT CTA: No PE. Patient's heart rate within normal range. Stat EKG: Revealed normal sinus rhythm with incomplete right bundle branch block.  Given several near syncopal episodes will consult with cardiology to see what they recommend.  Patient may need Holter monitor.  Patient slightly orthostatic when going from a sitting to standing position.  She was encouraged to hydrate and to change positions slowly.  Encouraged to wear TED hose.  If cardiology clears her an unclear etiology, would potentially get MRI of brain to rule out metastatic disease. Today, patient denies any neurological complaints including headache.  Visit Diagnosis 1. Syncope, unspecified syncope type   2. Pain and swelling of left lower leg   3. Cancer of lower lobe of right lung [facility])     Patient expressed understanding and was in agreement with this plan. She also understands that She can call clinic at any time with any questions, concerns, or complaints.     Greater than 50% was spent in counseling and coordination of care with this patient including but not limited to discussion of the relevant topics above (See A&P) including, but not limited to diagnosis and management of acute and chronic medical conditions.    Faythe Casa, AGNP-C [facility] [place]- [phone] Pager- [phone]  05/10/2018 9:26 AM

## 2018-05-13 ENCOUNTER — Inpatient Hospital Stay: Payer: PPO

## 2018-05-13 DIAGNOSIS — R55 Syncope and collapse: Secondary | ICD-10-CM | POA: Diagnosis not present

## 2018-05-13 DIAGNOSIS — C3431 Malignant neoplasm of lower lobe, right bronchus or lung: Secondary | ICD-10-CM

## 2018-05-13 MED ORDER — HEPARIN SOD (PORK) LOCK FLUSH 100 UNIT/ML IV SOLN
500.0000 [IU] | Freq: Once | INTRAVENOUS | Status: AC
  Administered 2018-05-13: 500 [IU] via INTRAVENOUS

## 2018-05-13 MED ORDER — SODIUM CHLORIDE 0.9% FLUSH
10.0000 mL | Freq: Once | INTRAVENOUS | Status: AC
  Administered 2018-05-13: 10 mL via INTRAVENOUS
  Filled 2018-05-13: qty 10

## 2018-05-16 ENCOUNTER — Telehealth: Payer: Self-pay | Admitting: *Deleted

## 2018-05-16 NOTE — Telephone Encounter (Signed)
Patient called to report that since being prescribed Xaralto last week she is getting weaker by the day. She would like to speak with NP regarding her symptoms.  She saw Sonia Baller last week and I explained that Beckey Rutter NP would be covering today.

## 2018-05-21 ENCOUNTER — Telehealth: Payer: Self-pay | Admitting: *Deleted

## 2018-05-21 ENCOUNTER — Other Ambulatory Visit: Payer: Self-pay | Admitting: *Deleted

## 2018-05-21 ENCOUNTER — Inpatient Hospital Stay (HOSPITAL_BASED_OUTPATIENT_CLINIC_OR_DEPARTMENT_OTHER): Payer: PPO | Admitting: Oncology

## 2018-05-21 ENCOUNTER — Inpatient Hospital Stay: Payer: PPO

## 2018-05-21 VITALS — BP 110/68 | HR 98 | Temp 98.8°F | Resp 16

## 2018-05-21 DIAGNOSIS — I82432 Acute embolism and thrombosis of left popliteal vein: Secondary | ICD-10-CM

## 2018-05-21 DIAGNOSIS — Z720 Tobacco use: Secondary | ICD-10-CM

## 2018-05-21 DIAGNOSIS — Z95828 Presence of other vascular implants and grafts: Secondary | ICD-10-CM

## 2018-05-21 DIAGNOSIS — E860 Dehydration: Secondary | ICD-10-CM

## 2018-05-21 DIAGNOSIS — C3431 Malignant neoplasm of lower lobe, right bronchus or lung: Secondary | ICD-10-CM

## 2018-05-21 DIAGNOSIS — I82492 Acute embolism and thrombosis of other specified deep vein of left lower extremity: Secondary | ICD-10-CM | POA: Diagnosis not present

## 2018-05-21 DIAGNOSIS — R55 Syncope and collapse: Secondary | ICD-10-CM | POA: Diagnosis not present

## 2018-05-21 DIAGNOSIS — R531 Weakness: Secondary | ICD-10-CM

## 2018-05-21 DIAGNOSIS — D649 Anemia, unspecified: Secondary | ICD-10-CM

## 2018-05-21 DIAGNOSIS — I82442 Acute embolism and thrombosis of left tibial vein: Secondary | ICD-10-CM | POA: Diagnosis not present

## 2018-05-21 DIAGNOSIS — R112 Nausea with vomiting, unspecified: Secondary | ICD-10-CM | POA: Diagnosis not present

## 2018-05-21 DIAGNOSIS — I959 Hypotension, unspecified: Secondary | ICD-10-CM

## 2018-05-21 DIAGNOSIS — E2740 Unspecified adrenocortical insufficiency: Secondary | ICD-10-CM

## 2018-05-21 LAB — CBC WITH DIFFERENTIAL/PLATELET
Basophils Absolute: 0.1 10*3/uL (ref 0–0.1)
Basophils Relative: 1 %
Eosinophils Absolute: 0.1 10*3/uL (ref 0–0.7)
Eosinophils Relative: 1 %
HEMATOCRIT: 30.8 % — AB (ref 35.0–47.0)
HEMOGLOBIN: 10.7 g/dL — AB (ref 12.0–16.0)
LYMPHS ABS: 0.9 10*3/uL — AB (ref 1.0–3.6)
LYMPHS PCT: 10 %
MCH: 34.6 pg — AB (ref 26.0–34.0)
MCHC: 34.7 g/dL (ref 32.0–36.0)
MCV: 99.9 fL (ref 80.0–100.0)
MONOS PCT: 8 %
Monocytes Absolute: 0.8 10*3/uL (ref 0.2–0.9)
Neutro Abs: 7.7 10*3/uL — ABNORMAL HIGH (ref 1.4–6.5)
Neutrophils Relative %: 80 %
Platelets: 563 10*3/uL — ABNORMAL HIGH (ref 150–440)
RBC: 3.09 MIL/uL — ABNORMAL LOW (ref 3.80–5.20)
RDW: 12.9 % (ref 11.5–14.5)
WBC: 9.6 10*3/uL (ref 3.6–11.0)

## 2018-05-21 LAB — COMPREHENSIVE METABOLIC PANEL
ALT: 12 U/L (ref 0–44)
AST: 19 U/L (ref 15–41)
Albumin: 2.5 g/dL — ABNORMAL LOW (ref 3.5–5.0)
Alkaline Phosphatase: 175 U/L — ABNORMAL HIGH (ref 38–126)
Anion gap: 10 (ref 5–15)
BUN: 10 mg/dL (ref 8–23)
CHLORIDE: 98 mmol/L (ref 98–111)
CO2: 24 mmol/L (ref 22–32)
Calcium: 8.6 mg/dL — ABNORMAL LOW (ref 8.9–10.3)
Creatinine, Ser: 0.56 mg/dL (ref 0.44–1.00)
GFR calc Af Amer: >60 mL/min (ref >60)
Glucose, Bld: 111 mg/dL — ABNORMAL HIGH (ref 70–99)
POTASSIUM: 3.8 mmol/L (ref 3.5–5.1)
SODIUM: 132 mmol/L — AB (ref 135–145)
Total Bilirubin: 0.3 mg/dL (ref 0.3–1.2)
Total Protein: 6.9 g/dL (ref 6.5–8.1)

## 2018-05-21 MED ORDER — DEXAMETHASONE 4 MG PO TABS: 4.0000 mg | ORAL_TABLET | Freq: Three times a day (TID) | ORAL | 0 refills | Status: DC

## 2018-05-21 MED ORDER — SODIUM CHLORIDE 0.9% FLUSH
10.0000 mL | INTRAVENOUS | Status: DC | PRN
  Administered 2018-05-21: 10 mL via INTRAVENOUS
  Filled 2018-05-21: qty 10

## 2018-05-21 MED ORDER — HEPARIN SOD (PORK) LOCK FLUSH 100 UNIT/ML IV SOLN
500.0000 [IU] | Freq: Once | INTRAVENOUS | Status: AC
  Administered 2018-05-21: 500 [IU] via INTRAVENOUS

## 2018-05-21 MED ORDER — SODIUM CHLORIDE 0.9 % IV SOLN: Freq: Once | INTRAVENOUS | Status: DC

## 2018-05-21 MED ORDER — DEXAMETHASONE SODIUM PHOSPHATE 10 MG/ML IJ SOLN
10.0000 mg | Freq: Once | INTRAMUSCULAR | Status: AC
  Administered 2018-05-21: 10 mg via INTRAVENOUS
  Filled 2018-05-21: qty 1

## 2018-05-21 MED ORDER — SODIUM CHLORIDE 0.9 % IV SOLN
Freq: Once | INTRAVENOUS | Status: AC
  Administered 2018-05-21: 15:00:00 via INTRAVENOUS
  Filled 2018-05-21: qty 250

## 2018-05-21 NOTE — Telephone Encounter (Signed)
Patient called and reports she has been sick ever since starting the Xarelto, she is dizzy and nauseated, states her bp drops when she gets up 96/58 from 120/68. She would like to discuss this with Sonia Baller . Please return her call [phone]

## 2018-05-21 NOTE — Progress Notes (Signed)
Symptom Management Consult note [facility]  [phone] [phone]  Patient Care Team: [place] Pink, MD as PCP - General (Family Medicine)   Name of the patient: Claudia Chavez  [id number]  Jul 31, 1956   Date of visit: 05/21/18  Diagnosis-stage III lung cancer  Chief complaint/ Reason for visit-dizziness/near syncopal episodes and weakness  Heme/Onc history: Patient was last seen in [facility] on 05/08/2018 for left leg swelling, shortness of breath and near syncopal episodes.  Had stat ultrasound of left lower extremity revealing occlusive DVT involving the popliteal and both left posterior tibial and peroneal veins.  She was started on Xarelto 15 mg BID for 21 days and then 20 mg daily.  Completed a stat CTA to rule out pulmonary embolism which was negative.  Stat EKG revealed normal sinus rhythm with a right BBB.  We recommended she see a cardiologist and referral was placed.  Encouraged hydration and change positions slowly.  Recommended an MRI of head if work-up negative.  Oncology History   # Squamous cell carcinoma of right LOWER LOBE OF lung stage IIIA based on PET scan Biopsy of the (August of 2016). 2. After 3 cycles of chemotherapy patient underwent resection of lung mass (November, 2016) ypT2B ypN0 [Dr.Oaks] status post right lower lobectomy with a 3. She was started on carboplatinum and Taxol on a weekly basis and radiation therapy from January of 2017 4. After 2 cycles of carboplatinum patient had neuropathy grade 2 interfering with work so chemotherapy was put on hold and radiation was continued (January 19th, 2017) 5. Chemotherapy was discontinued because of progressive neuropathy. Patient is finishing of radiation therapy on November 19, 2015  # NOV 2017- CT- 43mm right hilar LN; PET- Feb 2018- NED.   # DEC-JAN 2019- RUL Lung nodule [s/p Bronch; Dr.Kasa- non-diagnostic] SBRT Feb 2019     Cancer of lower lobe of right  lung ([place])   Interval history-  Patient complains of near syncope. Onset was 3 days ago, with worsening course since that time. Patient describes the episode as never actually lost consciousness, had precursor symptoms only, including diaphoresis, feeling of almost losing consciousness, greyed out vision. Patient also has associated symptoms of  tachycardia/palpitations. The patient denies abdominal pain, headache and nausea. Taking culprit meds?: none  ECOG FS:1 - Symptomatic but completely ambulatory  Review of systems- Review of Systems  Constitutional: Positive for diaphoresis and malaise/fatigue. Negative for chills, fever and weight loss.  HENT: Negative for congestion and ear pain.   Eyes: Negative.  Negative for blurred vision and double vision.  Respiratory: Negative.  Negative for cough, sputum production and shortness of breath.   Cardiovascular: Negative.  Negative for chest pain, palpitations and leg swelling.  Gastrointestinal: Negative.  Negative for abdominal pain, constipation, diarrhea, nausea and vomiting.  Genitourinary: Negative for dysuria, frequency and urgency.  Musculoskeletal: Negative for back pain and falls.  Skin: Negative.  Negative for rash.  Neurological: Positive for dizziness and weakness. Negative for headaches.  Endo/Heme/Allergies: Negative.  Does not bruise/bleed easily.  Psychiatric/Behavioral: Negative.  Negative for depression. The patient is not nervous/anxious and does not have insomnia.    Current treatment-s/p SBRT  Allergies  Allergen Reactions  . Lyrica [Pregabalin] Other (See Comments)    Made patient feel very dizzy and not feel good.      Past Medical History:  Diagnosis Date  . Cancer of lower lobe of right lung ([place]) 05/07/2015  . COPD (chronic obstructive pulmonary disease) ([place])   .  Non-small cell lung cancer ([place])   . Pneumonia 04/2015     Past Surgical History:  Procedure Laterality Date  . ABDOMINAL HYSTERECTOMY    .  ELBOW SURGERY Right 1995  . ELECTROMAGNETIC NAVIGATION BROCHOSCOPY N/A 10/25/2017   Procedure: ELECTROMAGNETIC NAVIGATION BRONCHOSCOPY;  Surgeon: [name], MD;  Location: [facility] ORS;  Service: Cardiopulmonary;  Laterality: N/A;  . PORTACATH PLACEMENT Right 05/10/2015   Procedure: INSERTION PORT-A-CATH;  Surgeon: Nestor Lewandowsky, MD;  Location: [facility] ORS;  Service: General;  Laterality: Right;  Marland Kitchen VIDEO ASSISTED THORACOSCOPY (VATS)/THOROCOTOMY Right 08/18/2015   Procedure: VIDEO ASSISTED THORACOSCOPY (VATS)/THOROCOTOMY;  Surgeon: Nestor Lewandowsky, MD;  Location: [facility] ORS;  Service: General;  Laterality: Right;    Social History   Socioeconomic History  . Marital status: Widowed    Spouse name: Not on file  . Number of children: Not on file  . Years of education: Not on file  . Highest education level: Not on file  Occupational History  . Not on file  Social Needs  . Financial resource strain: Not on file  . Food insecurity:    Worry: Not on file    Inability: Not on file  . Transportation needs:    Medical: Not on file    Non-medical: Not on file  Tobacco Use  . Smoking status: Current Every Day Smoker    Packs/day: 0.50    Years: 40.00    Pack years: 20.00    Types: Cigarettes  . Smokeless tobacco: Never Used  . Tobacco comment: "1 pk or less per day"  Substance and Sexual Activity  . Alcohol use: Yes    Alcohol/week: 2.0 - 4.0 standard drinks    Types: 2 - 4 Cans of beer per week    Comment: beer-occasionally  . Drug use: No  . Sexual activity: Never  Lifestyle  . Physical activity:    Days per week: Not on file    Minutes per session: Not on file  . Stress: Not on file  Relationships  . Social connections:    Talks on phone: Not on file    Gets together: Not on file    Attends religious service: Not on file    Active member of club or organization: Not on file    Attends meetings of clubs or organizations: Not on file    Relationship status: Not on file  . Intimate  partner violence:    Fear of current or ex partner: Not on file    Emotionally abused: Not on file    Physically abused: Not on file    Forced sexual activity: Not on file  Other Topics Concern  . Not on file  Social History Narrative  . Not on file    Family History  Problem Relation Age of Onset  . Stroke Mother   . Lung cancer Father 53     Current Outpatient Medications:  .  albuterol (PROVENTIL HFA;VENTOLIN HFA) 108 (90 Base) MCG/ACT inhaler, Inhale 1 puff into the lungs every 6 (six) hours as needed for wheezing or shortness of breath., Disp: , Rfl:  .  Cholecalciferol (VITAMIN D3) 1000 units CAPS, Take by mouth 2 (two) times daily., Disp: , Rfl:  .  fluticasone (FLONASE) 50 MCG/ACT nasal spray, SPRAY 2 SPRAYS INTO EACH NOSTRIL EVERY DAY, Disp: , Rfl: 1 .  Fluticasone-Salmeterol (ADVAIR DISKUS) 250-50 MCG/DOSE AEPB, INHALE 1 INHALATION INTO THE LUNGS EVERY 12 (TWELVE) HOURS., Disp: , Rfl:  .  ipratropium-albuterol (DUONEB) 0.5-2.5 (3) MG/3ML SOLN,  TAKE 3 MLS BY NEBULIZATION EVERY 4 (FOUR) HOURS AS NEEDED., Disp: 360 mL, Rfl: 3 .  Rivaroxaban 15 & 20 MG TBPK, Take as directed on package, Disp: 51 each, Rfl: 0 .  vitamin B-12 (CYANOCOBALAMIN) 1000 MCG tablet, Take 1,000 mcg by mouth daily., Disp: , Rfl:  .  dexamethasone (DECADRON) 4 MG tablet, Take 1 tablet (4 mg total) by mouth 3 (three) times daily., Disp: 15 tablet, Rfl: 0 No current facility-administered medications for this visit.   Facility-Administered Medications Ordered in Other Visits:  .  heparin lock flush 100 unit/mL, 500 Units, Intracatheter, Once PRN, Choksi, Janak, MD .  heparin lock flush 100 unit/mL, 500 Units, Intravenous, Once, Brahmanday, Govinda R, MD .  heparin lock flush 100 unit/mL, 500 Units, Intravenous, Once, Burns, Jennifer E, NP .  sodium chloride flush (NS) 0.9 % injection 10 mL, 10 mL, Intravenous, PRN, Charlaine Dalton R, MD .  sodium chloride flush (NS) 0.9 % injection 10 mL, 10 mL,  Intravenous, PRN, Jacquelin Hawking, NP  Physical exam:  Vitals:   05/21/18 1405 05/21/18 1408  BP: 110/68   Pulse: 98   Resp: 16   Temp:  98.8 F (37.1 C)  TempSrc:  Oral  SpO2:  94%   Physical Exam  Constitutional: She is oriented to person, place, and time. Vital signs are normal. She appears cachectic.  HENT:  Head: Normocephalic and atraumatic.  Eyes: Pupils are equal, round, and reactive to light.  Neck: Normal range of motion.  Cardiovascular: Normal rate, regular rhythm and normal heart sounds.  No murmur heard. Pulmonary/Chest: Effort normal and breath sounds normal. She has no wheezes.  Abdominal: Soft. Normal appearance and bowel sounds are normal. She exhibits no distension. There is no tenderness.  Musculoskeletal: Normal range of motion. She exhibits no edema.  Neurological: She is alert and oriented to person, place, and time.  Skin: Skin is warm and dry. No rash noted.  Psychiatric: Judgment normal.     CMP Latest Ref Rng & Units 05/21/2018  Glucose 70 - 99 mg/dL 111(H)  BUN 8 - 23 mg/dL 10  Creatinine 0.44 - 1.00 mg/dL 0.56  Sodium 135 - 145 mmol/L 132(L)  Potassium 3.5 - 5.1 mmol/L 3.8  Chloride 98 - 111 mmol/L 98  CO2 22 - 32 mmol/L 24  Calcium 8.9 - 10.3 mg/dL 8.6(L)  Total Protein 6.5 - 8.1 g/dL 6.9  Total Bilirubin 0.3 - 1.2 mg/dL 0.3  Alkaline Phos 38 - 126 U/L 175(H)  AST 15 - 41 U/L 19  ALT 0 - 44 U/L 12   CBC Latest Ref Rng & Units 05/21/2018  WBC 3.6 - 11.0 K/uL 9.6  Hemoglobin 12.0 - 16.0 g/dL 10.7(L)  Hematocrit 35.0 - 47.0 % 30.8(L)  Platelets 150 - 440 K/uL 563(H)    No images are attached to the encounter.  Ct Angio Chest Pe W Or Wo Contrast  Result Date: 05/08/2018 CLINICAL DATA:  Shortness of breath. History of right-sided lung carcinoma EXAM: CT ANGIOGRAPHY CHEST WITH CONTRAST TECHNIQUE: Multidetector CT imaging of the chest was performed using the standard protocol during bolus administration of intravenous contrast. Multiplanar  CT image reconstructions and MIPs were obtained to evaluate the vascular anatomy. CONTRAST:  86mL ISOVUE-370 IOPAMIDOL (ISOVUE-370) INJECTION 76% COMPARISON:  PET-CT examination April 01, 2018. FINDINGS: Cardiovascular: There is no demonstrable pulmonary embolus. There is no thoracic aortic aneurysm or dissection. There are foci of calcification in the proximal visualized great vessels. Visualized great vessels otherwise appear unremarkable.  There is aortic atherosclerosis. There are foci of coronary artery calcification. There is no pericardial effusion or pericardial thickening evident. Port-A-Cath tip is in the superior vena cava. Mediastinum/Nodes: Thyroid appears normal. There is a right hilar lymph node measuring 1.3 x 1.3 cm, minimally smaller than on previous study. There are scattered subcentimeter mediastinal lymph nodes without progression from prior recent studies. No esophageal lesions are evident. Lungs/Pleura: Underlying centrilobular emphysema is evident. There is increase in consolidation throughout the posterior segment of the right upper lobe. There may well be underlying residual mass in this area. Well-defined mass is difficult to discern from the consolidation. Well-defined cavitation not seen on this current examination. There is also extension of consolidation into a portion of the superior segment right lower lobe. The previously noted 6 mm nodular opacity in the posterior segment of the left upper lobe is seen on axial slice 28 series 9 but is marginally smaller at this time. No new parenchymal nodular opacities are evident. There is atelectatic change in the right middle lobe. No pleural effusion or pleural thickening evident. Upper Abdomen: Visualized upper abdominal structures appear normal except for atherosclerotic calcification in both proximal renal arteries. Musculoskeletal: There are no evident blastic or lytic bone lesions. No chest wall lesion evident. Review of the MIP images  confirms the above findings. IMPRESSION: 1. No demonstrable pulmonary embolus. There is no thoracic aortic aneurysm or dissection. There is aortic atherosclerosis. There are great vessel and coronary artery calcifications. 2. Increase in consolidation in the posterior segment right upper lobe with extension into a portion of the superior segment right lower lobe. Well-defined mass not seen. Mass could easily be obscured by this degree of consolidation. No well-defined cavitary lesion evident currently. 3. Previously noted enlarged right hilar lymph node is slightly smaller compared to recent studies. No new adenopathy evident. 5. Marginally smaller subcentimeter nodular lesion posterior segment left upper lobe. No new nodular lesions are evident. 6.  Underlying emphysematous change. Aortic Atherosclerosis (ICD10-I70.0) and Emphysema (ICD10-J43.9). Electronically Signed   By: Lowella Grip III M.D.   On: 05/08/2018 12:33   US Venous Img Lower Unilateral Left  Result Date: 05/08/2018 CLINICAL DATA:  Left lower extremity pain and edema. History of smoking. History of lung cancer. Evaluate for DVT. EXAM: LEFT LOWER EXTREMITY VENOUS DOPPLER ULTRASOUND TECHNIQUE: Gray-scale sonography with graded compression, as well as color Doppler and duplex ultrasound were performed to evaluate the lower extremity deep venous systems from the level of the common femoral vein and including the common femoral, femoral, profunda femoral, popliteal and calf veins including the posterior tibial, peroneal and gastrocnemius veins when visible. The superficial great saphenous vein was also interrogated. Spectral Doppler was utilized to evaluate flow at rest and with distal augmentation maneuvers in the common femoral, femoral and popliteal veins. COMPARISON:  Bilateral lower extremity venous Doppler ultrasound - 03/06/2015 FINDINGS: Contralateral Common Femoral Vein: Respiratory phasicity is normal and symmetric with the symptomatic  side. No evidence of thrombus. Normal compressibility. Common Femoral Vein: No evidence of thrombus. Normal compressibility, respiratory phasicity and response to augmentation. Saphenofemoral Junction: No evidence of thrombus. Normal compressibility and flow on color Doppler imaging. Profunda Femoral Vein: No evidence of thrombus. Normal compressibility and flow on color Doppler imaging. Femoral Vein: No evidence of thrombus. Normal compressibility, respiratory phasicity and response to augmentation. Popliteal Vein: There is hypoechoic occlusive thrombus within the popliteal vein (through there is hypoechoic occlusive thrombus within the popliteal vein (images 30 - 32). Calf Veins: There is hypoechoic  occlusive thrombus within both paired left posterior tibial and peroneal veins (images 33 and 36). Superficial Great Saphenous Vein: There is hypoechoic occlusive thrombus within the greater saphenous vein extending from the proximal thigh (image 37 through the level of the knee (image 39). Venous Reflux:  None. Other Findings:  None. IMPRESSION: 1. Examination is positive for occlusive DVT involving the popliteal both paired left posterior tibial and peroneal veins. 2. Examination is positive for occlusive SVT involving the left greater saphenous vein. Electronically Signed   By: Sandi Mariscal M.D.   On: 05/08/2018 12:11     Assessment and plan- Patient is a 62 y.o. female who presents for worsening dizziness, near syncopal episodes and weakness.  1.  Stage III lung cancer: S/p lobectomy 2016 by Dr. Genevive Bi.  Pleated 2 cycles of carbotaxol and discontinued due to fatigue.  Status post SBRT completed in 2017.  Most recent scans revealed a right upper lobe lung nodule-had bronchoscopy that was nondiagnostic.  Currently under surveillance.  Scheduled to return to clinic in September for repeat imaging and to see Dr. Rogue Bussing on 07/03/2018 for further assessment.  2.  Occlusive left lower leg DVT: Began Xarelto on  05/10/2018.  Since beginning Xarelto, patient has felt significantly more fatigued, weak and has had several near syncopal episodes.  Spoke with pharmacist and reviewed side effect profile of Xarelto and it is unlikely the symptoms are being caused from this medication alone.  There is concern for increased risk of injury with bleeding if she falls d/t these episodes.  She denies any traumatic falls. States most often she lowers herself to the floor.  3.  Near syncopal episodes/dizziness/weakness: Patient is orthostatic on assessment today.  Has a cardiology appointment scheduled for October 2019 which was first available.  She denies palpitations today.  States she has never "blacked out" during an episodes just complains of dizziness and tunnel vision.  Consulted with Dr. Rogue Bussing who recommends an MRI of the brain to r/o metastasis or bleed.  Patient also has been on and off steroids since she developed pneumonitis after SBRT.  Will hydrate her with 1 L NaCl today and 10 mg Decadron and give her a prescription for Decadron 4 mg TID for the next 5 days.  Will have her follow-up with Dr. Rogue Bussing on Friday morning with repeat labs including a cortisol level, assessment and review of MRI of head.  4. Anemia: Patient hemoglobin has dropped from 12.3-10.7 today.  He denies any active bleeding.  We will recheck CBC on Friday.   5.  Orthostatic hypotension: Continue to hydrate.  Will receive fluids as mentioned above.   Visit Diagnosis 1. Syncope, unspecified syncope type   2. Nausea and vomiting, intractability of vomiting not specified, unspecified vomiting type   3. Weakness     Patient expressed understanding and was in agreement with this plan. She also understands that She can call clinic at any time with any questions, concerns, or complaints.   Greater than 50% was spent in counseling and coordination of care with this patient including but not limited to discussion of the relevant topics above  (See A&P) including, but not limited to diagnosis and management of acute and chronic medical conditions.    Faythe Casa, AGNP-C [facility] [place]- [phone] Pager- [phone] 05/21/2018 4:10 PM

## 2018-05-22 ENCOUNTER — Ambulatory Visit
Admission: RE | Admit: 2018-05-22 | Discharge: 2018-05-22 | Disposition: A | Discharge status: Home / Self Care | Payer: PPO | Source: Ambulatory Visit | Attending: Oncology | Admitting: Oncology

## 2018-05-22 DIAGNOSIS — R402340 Coma scale, best motor response, flexion withdrawal, unspecified time: Secondary | ICD-10-CM | POA: Diagnosis not present

## 2018-05-22 DIAGNOSIS — Z85118 Personal history of other malignant neoplasm of bronchus and lung: Secondary | ICD-10-CM

## 2018-05-22 DIAGNOSIS — C7931 Secondary malignant neoplasm of brain: Secondary | ICD-10-CM | POA: Insufficient documentation

## 2018-05-22 DIAGNOSIS — Z7952 Long term (current) use of systemic steroids: Secondary | ICD-10-CM | POA: Diagnosis not present

## 2018-05-22 DIAGNOSIS — G930 Cerebral cysts: Secondary | ICD-10-CM

## 2018-05-22 DIAGNOSIS — Z95828 Presence of other vascular implants and grafts: Secondary | ICD-10-CM | POA: Diagnosis not present

## 2018-05-22 DIAGNOSIS — I63542 Cerebral infarction due to unspecified occlusion or stenosis of left cerebellar artery: Secondary | ICD-10-CM | POA: Diagnosis not present

## 2018-05-22 DIAGNOSIS — I639 Cerebral infarction, unspecified: Secondary | ICD-10-CM

## 2018-05-22 DIAGNOSIS — R29701 NIHSS score 1: Secondary | ICD-10-CM | POA: Diagnosis not present

## 2018-05-22 DIAGNOSIS — R402250 Coma scale, best verbal response, oriented, unspecified time: Secondary | ICD-10-CM | POA: Diagnosis not present

## 2018-05-22 DIAGNOSIS — R55 Syncope and collapse: Secondary | ICD-10-CM

## 2018-05-22 DIAGNOSIS — Z7901 Long term (current) use of anticoagulants: Secondary | ICD-10-CM | POA: Diagnosis not present

## 2018-05-22 DIAGNOSIS — J440 Chronic obstructive pulmonary disease with acute lower respiratory infection: Secondary | ICD-10-CM | POA: Diagnosis not present

## 2018-05-22 DIAGNOSIS — C3431 Malignant neoplasm of lower lobe, right bronchus or lung: Secondary | ICD-10-CM | POA: Diagnosis not present

## 2018-05-22 DIAGNOSIS — I6782 Cerebral ischemia: Secondary | ICD-10-CM | POA: Insufficient documentation

## 2018-05-22 DIAGNOSIS — R112 Nausea with vomiting, unspecified: Secondary | ICD-10-CM

## 2018-05-22 DIAGNOSIS — Z888 Allergy status to other drugs, medicaments and biological substances status: Secondary | ICD-10-CM | POA: Diagnosis not present

## 2018-05-22 DIAGNOSIS — Z7951 Long term (current) use of inhaled steroids: Secondary | ICD-10-CM | POA: Diagnosis not present

## 2018-05-22 DIAGNOSIS — R9089 Other abnormal findings on diagnostic imaging of central nervous system: Secondary | ICD-10-CM

## 2018-05-22 DIAGNOSIS — R402140 Coma scale, eyes open, spontaneous, unspecified time: Secondary | ICD-10-CM | POA: Diagnosis not present

## 2018-05-22 DIAGNOSIS — Z923 Personal history of irradiation: Secondary | ICD-10-CM | POA: Diagnosis not present

## 2018-05-22 DIAGNOSIS — F17210 Nicotine dependence, cigarettes, uncomplicated: Secondary | ICD-10-CM | POA: Diagnosis not present

## 2018-05-22 DIAGNOSIS — Z9221 Personal history of antineoplastic chemotherapy: Secondary | ICD-10-CM | POA: Diagnosis not present

## 2018-05-22 DIAGNOSIS — Z86718 Personal history of other venous thrombosis and embolism: Secondary | ICD-10-CM | POA: Diagnosis not present

## 2018-05-22 DIAGNOSIS — Z79899 Other long term (current) drug therapy: Secondary | ICD-10-CM | POA: Diagnosis not present

## 2018-05-22 MED ORDER — GADOBENATE DIMEGLUMINE 529 MG/ML IV SOLN
10.0000 mL | Freq: Once | INTRAVENOUS | Status: AC | PRN
  Administered 2018-05-22: 9 mL via INTRAVENOUS

## 2018-05-24 ENCOUNTER — Observation Stay: Payer: PPO

## 2018-05-24 ENCOUNTER — Inpatient Hospital Stay
Admission: AD | Admit: 2018-05-24 | Discharge: 2018-05-25 | DRG: 064 | Disposition: A | Discharge status: Home / Self Care | Payer: PPO | Source: Ambulatory Visit | Attending: Internal Medicine | Admitting: Internal Medicine

## 2018-05-24 ENCOUNTER — Inpatient Hospital Stay: Payer: PPO

## 2018-05-24 ENCOUNTER — Observation Stay (HOSPITAL_BASED_OUTPATIENT_CLINIC_OR_DEPARTMENT_OTHER)
Admission: AD | Admit: 2018-05-24 | Discharge: 2018-05-24 | Disposition: A | Discharge status: Home / Self Care | Payer: PPO | Source: Ambulatory Visit | Attending: Family Medicine | Admitting: Family Medicine

## 2018-05-24 ENCOUNTER — Encounter: Payer: Self-pay | Admitting: Radiology

## 2018-05-24 ENCOUNTER — Other Ambulatory Visit: Payer: Self-pay

## 2018-05-24 ENCOUNTER — Encounter: Payer: Self-pay | Admitting: Internal Medicine

## 2018-05-24 ENCOUNTER — Inpatient Hospital Stay (HOSPITAL_BASED_OUTPATIENT_CLINIC_OR_DEPARTMENT_OTHER): Payer: PPO | Admitting: Internal Medicine

## 2018-05-24 VITALS — BP 133/73 | HR 88 | Temp 97.6°F | Resp 18 | Ht 61.0 in | Wt 99.5 lb

## 2018-05-24 DIAGNOSIS — Z7952 Long term (current) use of systemic steroids: Secondary | ICD-10-CM | POA: Diagnosis not present

## 2018-05-24 DIAGNOSIS — R112 Nausea with vomiting, unspecified: Secondary | ICD-10-CM | POA: Diagnosis not present

## 2018-05-24 DIAGNOSIS — Z7901 Long term (current) use of anticoagulants: Secondary | ICD-10-CM | POA: Diagnosis not present

## 2018-05-24 DIAGNOSIS — Z86718 Personal history of other venous thrombosis and embolism: Secondary | ICD-10-CM

## 2018-05-24 DIAGNOSIS — R911 Solitary pulmonary nodule: Secondary | ICD-10-CM | POA: Diagnosis not present

## 2018-05-24 DIAGNOSIS — Z7951 Long term (current) use of inhaled steroids: Secondary | ICD-10-CM

## 2018-05-24 DIAGNOSIS — Z9221 Personal history of antineoplastic chemotherapy: Secondary | ICD-10-CM | POA: Diagnosis not present

## 2018-05-24 DIAGNOSIS — Z923 Personal history of irradiation: Secondary | ICD-10-CM | POA: Diagnosis not present

## 2018-05-24 DIAGNOSIS — Z888 Allergy status to other drugs, medicaments and biological substances status: Secondary | ICD-10-CM

## 2018-05-24 DIAGNOSIS — J449 Chronic obstructive pulmonary disease, unspecified: Secondary | ICD-10-CM

## 2018-05-24 DIAGNOSIS — I639 Cerebral infarction, unspecified: Secondary | ICD-10-CM | POA: Diagnosis not present

## 2018-05-24 DIAGNOSIS — F17210 Nicotine dependence, cigarettes, uncomplicated: Secondary | ICD-10-CM | POA: Diagnosis not present

## 2018-05-24 DIAGNOSIS — I959 Hypotension, unspecified: Secondary | ICD-10-CM | POA: Diagnosis not present

## 2018-05-24 DIAGNOSIS — G939 Disorder of brain, unspecified: Secondary | ICD-10-CM | POA: Diagnosis not present

## 2018-05-24 DIAGNOSIS — I82492 Acute embolism and thrombosis of other specified deep vein of left lower extremity: Secondary | ICD-10-CM | POA: Diagnosis not present

## 2018-05-24 DIAGNOSIS — R402250 Coma scale, best verbal response, oriented, unspecified time: Secondary | ICD-10-CM | POA: Diagnosis present

## 2018-05-24 DIAGNOSIS — Z95828 Presence of other vascular implants and grafts: Secondary | ICD-10-CM | POA: Diagnosis not present

## 2018-05-24 DIAGNOSIS — R29701 NIHSS score 1: Secondary | ICD-10-CM | POA: Diagnosis not present

## 2018-05-24 DIAGNOSIS — C3492 Malignant neoplasm of unspecified part of left bronchus or lung: Secondary | ICD-10-CM | POA: Diagnosis not present

## 2018-05-24 DIAGNOSIS — I63542 Cerebral infarction due to unspecified occlusion or stenosis of left cerebellar artery: Principal | ICD-10-CM | POA: Diagnosis present

## 2018-05-24 DIAGNOSIS — R42 Dizziness and giddiness: Secondary | ICD-10-CM | POA: Diagnosis not present

## 2018-05-24 DIAGNOSIS — R402140 Coma scale, eyes open, spontaneous, unspecified time: Secondary | ICD-10-CM | POA: Diagnosis not present

## 2018-05-24 DIAGNOSIS — I6523 Occlusion and stenosis of bilateral carotid arteries: Secondary | ICD-10-CM | POA: Diagnosis not present

## 2018-05-24 DIAGNOSIS — D649 Anemia, unspecified: Secondary | ICD-10-CM | POA: Diagnosis not present

## 2018-05-24 DIAGNOSIS — I5030 Unspecified diastolic (congestive) heart failure: Secondary | ICD-10-CM

## 2018-05-24 DIAGNOSIS — R55 Syncope and collapse: Secondary | ICD-10-CM | POA: Diagnosis not present

## 2018-05-24 DIAGNOSIS — R402340 Coma scale, best motor response, flexion withdrawal, unspecified time: Secondary | ICD-10-CM | POA: Diagnosis not present

## 2018-05-24 DIAGNOSIS — C3431 Malignant neoplasm of lower lobe, right bronchus or lung: Secondary | ICD-10-CM

## 2018-05-24 DIAGNOSIS — J440 Chronic obstructive pulmonary disease with acute lower respiratory infection: Secondary | ICD-10-CM | POA: Diagnosis present

## 2018-05-24 DIAGNOSIS — Z79899 Other long term (current) drug therapy: Secondary | ICD-10-CM | POA: Diagnosis not present

## 2018-05-24 DIAGNOSIS — I82432 Acute embolism and thrombosis of left popliteal vein: Secondary | ICD-10-CM | POA: Diagnosis not present

## 2018-05-24 DIAGNOSIS — M79662 Pain in left lower leg: Secondary | ICD-10-CM | POA: Diagnosis not present

## 2018-05-24 DIAGNOSIS — C3490 Malignant neoplasm of unspecified part of unspecified bronchus or lung: Secondary | ICD-10-CM | POA: Diagnosis not present

## 2018-05-24 DIAGNOSIS — R531 Weakness: Secondary | ICD-10-CM

## 2018-05-24 DIAGNOSIS — I82409 Acute embolism and thrombosis of unspecified deep veins of unspecified lower extremity: Secondary | ICD-10-CM | POA: Diagnosis not present

## 2018-05-24 DIAGNOSIS — Z720 Tobacco use: Secondary | ICD-10-CM

## 2018-05-24 DIAGNOSIS — I82442 Acute embolism and thrombosis of left tibial vein: Secondary | ICD-10-CM | POA: Diagnosis not present

## 2018-05-24 DIAGNOSIS — Z452 Encounter for adjustment and management of vascular access device: Secondary | ICD-10-CM | POA: Diagnosis not present

## 2018-05-24 LAB — ECHOCARDIOGRAM COMPLETE
HEIGHTINCHES: 61 in
WEIGHTICAEL: 1668.8 [oz_av]

## 2018-05-24 LAB — CBC WITH DIFFERENTIAL/PLATELET
BASOS ABS: 0 10*3/uL (ref 0–0.1)
BASOS PCT: 0 %
Eosinophils Absolute: 0 10*3/uL (ref 0–0.7)
Eosinophils Relative: 0 %
HEMATOCRIT: 29.8 % — AB (ref 35.0–47.0)
Hemoglobin: 10.5 g/dL — ABNORMAL LOW (ref 12.0–16.0)
LYMPHS PCT: 5 %
Lymphs Abs: 0.6 10*3/uL — ABNORMAL LOW (ref 1.0–3.6)
MCH: 35.4 pg — ABNORMAL HIGH (ref 26.0–34.0)
MCHC: 35.3 g/dL (ref 32.0–36.0)
MCV: 100.3 fL — AB (ref 80.0–100.0)
Monocytes Absolute: 0.5 10*3/uL (ref 0.2–0.9)
Monocytes Relative: 5 %
NEUTROS ABS: 9.9 10*3/uL — AB (ref 1.4–6.5)
NEUTROS PCT: 90 %
Platelets: 629 10*3/uL — ABNORMAL HIGH (ref 150–440)
RBC: 2.97 MIL/uL — ABNORMAL LOW (ref 3.80–5.20)
RDW: 13.1 % (ref 11.5–14.5)
WBC: 11 10*3/uL (ref 3.6–11.0)

## 2018-05-24 LAB — CORTISOL: Cortisol, Plasma: 2.4 ug/dL

## 2018-05-24 MED ORDER — ALBUTEROL SULFATE (2.5 MG/3ML) 0.083% IN NEBU: 2.5000 mg | INHALATION_SOLUTION | Freq: Four times a day (QID) | RESPIRATORY_TRACT | Status: DC | PRN

## 2018-05-24 MED ORDER — MOMETASONE FURO-FORMOTEROL FUM 200-5 MCG/ACT IN AERO
2.0000 | INHALATION_SPRAY | Freq: Two times a day (BID) | RESPIRATORY_TRACT | Status: DC
  Administered 2018-05-24 – 2018-05-25 (×2): 2 via RESPIRATORY_TRACT
  Filled 2018-05-24: qty 8.8

## 2018-05-24 MED ORDER — FLUTICASONE PROPIONATE 50 MCG/ACT NA SUSP
2.0000 | Freq: Every day | NASAL | Status: DC
  Filled 2018-05-24: qty 16

## 2018-05-24 MED ORDER — SENNOSIDES-DOCUSATE SODIUM 8.6-50 MG PO TABS: 1.0000 | ORAL_TABLET | Freq: Every evening | ORAL | Status: DC | PRN

## 2018-05-24 MED ORDER — ASPIRIN 325 MG PO TABS
325.0000 mg | ORAL_TABLET | Freq: Every day | ORAL | Status: DC
  Administered 2018-05-24 – 2018-05-25 (×2): 325 mg via ORAL
  Filled 2018-05-24 (×2): qty 1

## 2018-05-24 MED ORDER — ALBUTEROL SULFATE HFA 108 (90 BASE) MCG/ACT IN AERS: 1.0000 | INHALATION_SPRAY | Freq: Four times a day (QID) | RESPIRATORY_TRACT | Status: DC | PRN

## 2018-05-24 MED ORDER — ACETAMINOPHEN 160 MG/5ML PO SOLN
650.0000 mg | ORAL | Status: DC | PRN
  Filled 2018-05-24: qty 20.3

## 2018-05-24 MED ORDER — RIVAROXABAN 20 MG PO TABS: 20.0000 mg | ORAL_TABLET | Freq: Every day | ORAL | Status: DC

## 2018-05-24 MED ORDER — IPRATROPIUM-ALBUTEROL 0.5-2.5 (3) MG/3ML IN SOLN: 3.0000 mL | RESPIRATORY_TRACT | Status: DC | PRN

## 2018-05-24 MED ORDER — IOPAMIDOL (ISOVUE-370) INJECTION 76%
75.0000 mL | Freq: Once | INTRAVENOUS | Status: AC | PRN
  Administered 2018-05-24: 75 mL via INTRAVENOUS

## 2018-05-24 MED ORDER — VITAMIN D3 25 MCG (1000 UNIT) PO TABS
1000.0000 [IU] | ORAL_TABLET | Freq: Two times a day (BID) | ORAL | Status: DC
  Administered 2018-05-24 – 2018-05-25 (×2): 1000 [IU] via ORAL
  Filled 2018-05-24 (×4): qty 1

## 2018-05-24 MED ORDER — ASPIRIN 300 MG RE SUPP: 300.0000 mg | Freq: Every day | RECTAL | Status: DC

## 2018-05-24 MED ORDER — ACETAMINOPHEN 650 MG RE SUPP: 650.0000 mg | RECTAL | Status: DC | PRN

## 2018-05-24 MED ORDER — ACETAMINOPHEN 325 MG PO TABS: 650.0000 mg | ORAL_TABLET | ORAL | Status: DC | PRN

## 2018-05-24 MED ORDER — RIVAROXABAN 15 MG PO TABS
15.0000 mg | ORAL_TABLET | Freq: Two times a day (BID) | ORAL | Status: DC
  Administered 2018-05-24 – 2018-05-25 (×3): 15 mg via ORAL
  Filled 2018-05-24 (×4): qty 1

## 2018-05-24 MED ORDER — STROKE: EARLY STAGES OF RECOVERY BOOK
Freq: Once | Status: AC
  Administered 2018-05-24: 15:00:00

## 2018-05-24 MED ORDER — LEVETIRACETAM 500 MG PO TABS
500.0000 mg | ORAL_TABLET | Freq: Two times a day (BID) | ORAL | Status: DC
  Administered 2018-05-24 – 2018-05-25 (×3): 500 mg via ORAL
  Filled 2018-05-24 (×3): qty 1

## 2018-05-24 MED ORDER — DEXAMETHASONE 4 MG PO TABS
4.0000 mg | ORAL_TABLET | Freq: Three times a day (TID) | ORAL | Status: DC
  Administered 2018-05-24 – 2018-05-25 (×3): 4 mg via ORAL
  Filled 2018-05-24 (×5): qty 1

## 2018-05-24 MED ORDER — VITAMIN B-12 1000 MCG PO TABS
1000.0000 ug | ORAL_TABLET | Freq: Every day | ORAL | Status: DC
  Administered 2018-05-24 – 2018-05-25 (×2): 1000 ug via ORAL
  Filled 2018-05-24 (×2): qty 1

## 2018-05-24 MED ORDER — LEVETIRACETAM 100 MG/ML PO SOLN
100.0000 mg | Freq: Two times a day (BID) | ORAL | Status: DC
  Filled 2018-05-24 (×2): qty 2.5

## 2018-05-24 NOTE — Progress Notes (Signed)
[place] OFFICE PROGRESS NOTE  Patient Care Team: [place] Pink, MD as PCP - General (Family Medicine)  Cancer Staging Cancer of lower lobe of right lung [facility]) Staging form: Lung, AJCC 7th Edition - Clinical: Stage IIIA (T3, N2, M0) - Signed by Forest Gleason, MD on 05/07/2015    Oncology History   # Squamous cell carcinoma of right LOWER LOBE OF lung stage IIIA based on PET scan Biopsy of the (August of 2016). 2. After 3 cycles of chemotherapy patient underwent resection of lung mass (November, 2016) ypT2B ypN0 [Dr.Oaks] status post right lower lobectomy with a 3. She was started on carboplatinum and Taxol on a weekly basis and radiation therapy from January of 2017 4. After 2 cycles of carboplatinum patient had neuropathy grade 2 interfering with work so chemotherapy was put on hold and radiation was continued (January 19th, 2017) 5. Chemotherapy was discontinued because of progressive neuropathy. Patient is finishing of radiation therapy on November 19, 2015  # NOV 2017- CT- 53mm right hilar LN; PET- Feb 2018- NED.   # DEC-JAN 2019- RUL Lung nodule [s/p Bronch; Dr.Kasa- non-diagnostic] SBRT Feb 2019     Cancer of lower lobe of right lung ([place])      INTERVAL HISTORY:  Claudia Chavez 62 y.o.  female pleasant patient above history of stage III lung cancer; and also stage I right upper lobe status post radiation February 2019 is here for follow-up of abnormal MRI.  Patient states that she had not been "feeling good" for the last month or so.  On 18 July patient had a near syncope episode while she was at a cemetery/hot weather.  However 2 weeks later she had a presyncopal episodes which lasted for about 2 minutes.  And again she had a similar episode last week x2.  Patient denies any chest pain or palpitations.  Patient did have episodes of tunnel vision.  No seizure-like activity as per patient.  Of note patient also noted to have leg pain/left leg  swelling-when she was diagnosed with a DVT of the left lower extremity.  Patient was started on Xarelto.  However the last week or so patient got significantly tired/dizzy-when she was evaluated in the cancer center clinic-found to be orthostatic.  Patient received IV fluids dexamethasone; and also ordered MRI of the brain.  Patient started on dexamethasone 4 mg 3 times a day for possible adrenal insufficiency.  Patient feels better today.   Review of Systems  Constitutional: Positive for malaise/fatigue. Negative for chills, diaphoresis, fever and weight loss.  HENT: Negative for nosebleeds and sore throat.   Eyes: Negative for double vision.  Respiratory: Negative for cough, hemoptysis, sputum production, shortness of breath and wheezing.   Cardiovascular: Negative for chest pain, palpitations, orthopnea and leg swelling.  Gastrointestinal: Negative for abdominal pain, blood in stool, constipation, diarrhea, heartburn, melena, nausea and vomiting.  Genitourinary: Negative for dysuria, frequency and urgency.  Musculoskeletal: Negative for back pain and joint pain.  Skin: Negative.  Negative for itching and rash.  Neurological: Positive for dizziness and loss of consciousness. Negative for tingling, focal weakness, weakness and headaches.  Endo/Heme/Allergies: Does not bruise/bleed easily.  Psychiatric/Behavioral: Negative for depression. The patient is not nervous/anxious and does not have insomnia.       PAST MEDICAL HISTORY :  Past Medical History:  Diagnosis Date  . Cancer of lower lobe of right lung ([place]) 05/07/2015  . COPD (chronic obstructive pulmonary disease) ([place])   . Non-small cell lung  cancer ([place])   . Pneumonia 04/2015    PAST SURGICAL HISTORY :   Past Surgical History:  Procedure Laterality Date  . ABDOMINAL HYSTERECTOMY    . ELBOW SURGERY Right 1995  . ELECTROMAGNETIC NAVIGATION BROCHOSCOPY N/A 10/25/2017   Procedure: ELECTROMAGNETIC NAVIGATION BRONCHOSCOPY;  Surgeon:  [name], MD;  Location: [facility] ORS;  Service: Cardiopulmonary;  Laterality: N/A;  . PORTACATH PLACEMENT Right 05/10/2015   Procedure: INSERTION PORT-A-CATH;  Surgeon: Nestor Lewandowsky, MD;  Location: [facility] ORS;  Service: General;  Laterality: Right;  Marland Kitchen VIDEO ASSISTED THORACOSCOPY (VATS)/THOROCOTOMY Right 08/18/2015   Procedure: VIDEO ASSISTED THORACOSCOPY (VATS)/THOROCOTOMY;  Surgeon: Nestor Lewandowsky, MD;  Location: [facility] ORS;  Service: General;  Laterality: Right;    FAMILY HISTORY :   Family History  Problem Relation Age of Onset  . Stroke Mother   . Lung cancer Father 27    SOCIAL HISTORY:   Social History   Tobacco Use  . Smoking status: Current Every Day Smoker    Packs/day: 0.50    Years: 40.00    Pack years: 20.00    Types: Cigarettes  . Smokeless tobacco: Never Used  . Tobacco comment: "1 pk or less per day"  Substance Use Topics  . Alcohol use: Yes    Alcohol/week: 2.0 - 4.0 standard drinks    Types: 2 - 4 Cans of beer per week    Comment: beer-occasionally  . Drug use: No    ALLERGIES:  is allergic to lyrica [pregabalin].  MEDICATIONS:  Current Outpatient Medications  Medication Sig Dispense Refill  . albuterol (PROVENTIL HFA;VENTOLIN HFA) 108 (90 Base) MCG/ACT inhaler Inhale 1 puff into the lungs every 6 (six) hours as needed for wheezing or shortness of breath.    . Cholecalciferol (VITAMIN D3) 1000 units CAPS Take by mouth 2 (two) times daily.    Marland Kitchen dexamethasone (DECADRON) 4 MG tablet Take 1 tablet (4 mg total) by mouth 3 (three) times daily. 15 tablet 0  . fluticasone (FLONASE) 50 MCG/ACT nasal spray SPRAY 2 SPRAYS INTO EACH NOSTRIL EVERY DAY  1  . Fluticasone-Salmeterol (ADVAIR DISKUS) 250-50 MCG/DOSE AEPB INHALE 1 INHALATION INTO THE LUNGS EVERY 12 (TWELVE) HOURS.    Marland Kitchen ipratropium-albuterol (DUONEB) 0.5-2.5 (3) MG/3ML SOLN TAKE 3 MLS BY NEBULIZATION EVERY 4 (FOUR) HOURS AS NEEDED. 360 mL 3  . Rivaroxaban 15 & 20 MG TBPK Take as directed on package 51 each 0  .  vitamin B-12 (CYANOCOBALAMIN) 1000 MCG tablet Take 1,000 mcg by mouth daily.     No current facility-administered medications for this visit.    Facility-Administered Medications Ordered in Other Visits  Medication Dose Route Frequency Provider Last Rate Last Dose  . heparin lock flush 100 unit/mL  500 Units Intracatheter Once PRN Forest Gleason, MD      . heparin lock flush 100 unit/mL  500 Units Intravenous Once Charlaine Dalton R, MD      . sodium chloride flush (NS) 0.9 % injection 10 mL  10 mL Intravenous PRN Cammie Sickle, MD        PHYSICAL EXAMINATION: ECOG PERFORMANCE STATUS: 1 - Symptomatic but completely ambulatory  BP 133/73   Pulse 88   Temp 97.6 F (36.4 C) (Tympanic)   Resp 18   Ht 5\' 1"  (1.549 m)   Wt 99 lb 8 oz (45.1 kg)   BMI 18.80 kg/m   Filed Weights   05/24/18 0825  Weight: 99 lb 8 oz (45.1 kg)    Physical Exam  Constitutional: She is  oriented to person, place, and time and well-developed, well-nourished, and in no distress.  HENT:  Head: Normocephalic and atraumatic.  Mouth/Throat: Oropharynx is clear and moist. No oropharyngeal exudate.  Eyes: Pupils are equal, round, and reactive to light.  Neck: Normal range of motion. Neck supple.  Cardiovascular: Normal rate and regular rhythm.  Pulmonary/Chest: No respiratory distress. She has no wheezes.  Abdominal: Soft. Bowel sounds are normal. She exhibits no distension and no mass. There is no tenderness. There is no rebound and no guarding.  Musculoskeletal: Normal range of motion. She exhibits no edema or tenderness.  Neurological: She is alert and oriented to person, place, and time.  Skin: Skin is warm.  Psychiatric: Affect normal.       LABORATORY DATA:  I have reviewed the data as listed    Component Value Date/Time   NA 132 (L) 05/21/2018 1341   K 3.8 05/21/2018 1341   CL 98 05/21/2018 1341   CO2 24 05/21/2018 1341   GLUCOSE 111 (H) 05/21/2018 1341   BUN 10 05/21/2018 1341    CREATININE 0.56 05/21/2018 1341   CALCIUM 8.6 (L) 05/21/2018 1341   PROT 6.9 05/21/2018 1341   ALBUMIN 2.5 (L) 05/21/2018 1341   AST 19 05/21/2018 1341   ALT 12 05/21/2018 1341   ALKPHOS 175 (H) 05/21/2018 1341   BILITOT 0.3 05/21/2018 1341   GFRNONAA >60 05/21/2018 1341   GFRAA >60 05/21/2018 1341    No results found for: SPEP, UPEP  Lab Results  Component Value Date   WBC 11.0 05/24/2018   NEUTROABS 9.9 (H) 05/24/2018   HGB 10.5 (L) 05/24/2018   HCT 29.8 (L) 05/24/2018   MCV 100.3 (H) 05/24/2018   PLT 629 (H) 05/24/2018      Chemistry      Component Value Date/Time   NA 132 (L) 05/21/2018 1341   K 3.8 05/21/2018 1341   CL 98 05/21/2018 1341   CO2 24 05/21/2018 1341   BUN 10 05/21/2018 1341   CREATININE 0.56 05/21/2018 1341      Component Value Date/Time   CALCIUM 8.6 (L) 05/21/2018 1341   ALKPHOS 175 (H) 05/21/2018 1341   AST 19 05/21/2018 1341   ALT 12 05/21/2018 1341   BILITOT 0.3 05/21/2018 1341       RADIOGRAPHIC STUDIES: I have personally reviewed the radiological images as listed and agreed with the findings in the report. Mr Jeri Cos Wo Contrast  Result Date: 05/22/2018 CLINICAL DATA:  62 y/o F; multiple episodes of syncope. History of lung cancer. EXAM: MRI HEAD WITHOUT AND WITH CONTRAST TECHNIQUE: Multiplanar, multiecho pulse sequences of the brain and surrounding structures were obtained without and with intravenous contrast. CONTRAST:  70mL MULTIHANCE GADOBENATE DIMEGLUMINE 529 MG/ML IV SOLN COMPARISON:  04/01/2018 PET-CT. FINDINGS: Brain: Very small chronic infarcts within the bilateral cerebellar hemispheres. Few nonspecific foci of T2 FLAIR hyperintense signal abnormality in subcortical and periventricular white matter are compatible mild chronic microvascular ischemic changes. Mild volume loss of the brain. No extra-axial collection, hydrocephalus, or herniation. Lobulated multi cystic lesion within the left posterior frontal subcortical white matter  measuring 13 x 12 x 13 mm (AP x ML x CC series 10, image 18 and series 15, image 15). The fluid components do not demonstrate reduced diffusion. The T2 hyperintense rim components are T2, FLAIR, diffusion hyperintense but do not demonstrate reduced diffusion on ADC. After administration of intravenous contrast there is no abnormal enhancement. Vascular: Normal flow voids. Skull and upper cervical spine: 7  mm nonspecific enhancing focus near left lambdoid suture (series 16, image 115). Sinuses/Orbits: Negative. Other: None. IMPRESSION: 1. Lobulated multi-cystic lesion in the left posterior frontal subcortical white matter measuring up to 13 mm. No reduced diffusion, blood products, or enhancement. Given additional small infarcts in the brain, subacute infarction is suspected. Some differential considerations include cystic CNS glioma/oligodendroglioma, nonenhancing intracranial metastasis, or non pyogenic abscess. Follow-up is recommended. 2. Small chronic infarcts in the bilateral cerebellar hemispheres. Mild chronic microvascular ischemic changes and volume loss of the brain. 3. Nonspecific 7 mm focus of calvarial enhancement near left lambdoid suture. Differential includes fat poor hemangioma or metastasis. Attention at follow-up. Electronically Signed   By: Kristine Garbe M.D.   On: 05/22/2018 18:33     ASSESSMENT & PLAN:  Cancer of lower lobe of right lung ([place]) # Right upper lobe stage I lung cancer [biopsy nondiagnostic]- s/p feb 22nd SBRT; CT scan suggestive of radiation changes; PET scan April 01, 2018 also suggestive of radiation changes; less likely recurrence. STABLE.   #Left lung stage III lung cancer-STABLE  # Presyncope/syncopal episodes-question etiology; orthostasis versus cardiac etiology.  Patient is currently on dexamethasone for possible adrenal insufficiency; which could be stopped at this time.  Recommend telemetry.  #Bilateral cerebellar subacute infarcts-question etiology  discussed with Dr.Z/neurology recommends CT head and neck angiogram.   # 13 millimeters nonenhancing lesion left frontal area-cystic; discussed with Dr. Lysle Morales neurosurgery; [facility] who recommends follow-up imaging in approximately 2 to 3 months.  #Left upper lobe 5 mm nodule -new primary versus metastasis versus others.    #COPD stable.  # Post thoracotomy syndrome-stable.  # I reviewed the blood work- with the patient in detail; also reviewed the imaging independently [as summarized above]; and with the patient in detail.   #Discussed with Dr. Bridgett Larsson admitting hospitalist; kindly agrees to admit the patient.  Follow-up to be decided based on discharge in the hospital.  Discussed with the patient the above plan in detail.  She agrees.   # 40 minutes face-to-face with the patient discussing the above plan of care; more than 50% of time spent on prognosis/ natural history; counseling and coordination.    No orders of the defined types were placed in this encounter.  All questions were answered. The patient knows to call the clinic with any problems, questions or concerns.      Cammie Sickle, MD 05/24/2018 9:54 AM

## 2018-05-24 NOTE — Evaluation (Signed)
Physical Therapy Evaluation Patient Details Name: Claudia Chavez MRN: [id number] DOB: 02-09-1956 Today's Date: 05/24/2018   History of Present Illness   Pt is a 62 y.o.  female with history of stage III lung cancer and also stage I right upper lobe status post radiation February 2019.  Patient states that she had not been "feeling good" for the last month or so.  On 18 July patient had a near syncope episode while she was at a cemetery/hot weather.  However 2 weeks later she had a presyncopal episodes which lasted for about 2 minutes.  And again she had a similar episode last week x2.  Patient denies any chest pain or palpitations.  Patient did have episodes of tunnel vision.  No seizure-like activity as per patient.  Of note patient also noted to have leg pain/left leg swelling-when she was diagnosed with a DVT of the left lower extremity.  Patient was started on Xarelto.  However the last week or so patient got significantly tired/dizzy-when she was evaluated in the cancer center clinic-found to be orthostatic.  Patient received IV fluids dexamethasone; and also ordered MRI of the brain.  Patient started on dexamethasone 4 mg 3 times a day for possible adrenal insufficiency.   Assessment includes: Right upper lobe stage I lung cancer, Left lung stage III lung cancer, Bilateral cerebellar subacute infarcts, 13 millimeter nonenhancing lesion left frontal area, COPD, and post thoracotomy syndrome.     Clinical Impression  Pt presents with no deficits in functional strength, transfers, mobility, gait, balance, or activity tolerance.  Pt presented with good stability during unsupported standing with feet apart, together, and semi-tandem with both eyes open and closed.  Pt was able to amb 200+ feet independently without AD with good cadence and stability with no adverse symptoms.  Pt ascended and descended 4 steps with and without rails independently with good stability and again without adverse symptoms.  No  needs identified at this time with pt agreeing that she feels back to her current baseline with no PT needs.  Will complete PT orders at this time but will reassess pt pending a change in status upon receipt of new PT orders.        Follow Up Recommendations No PT follow up    Equipment Recommendations  None recommended by PT    Recommendations for Other Services       Precautions / Restrictions Precautions Precautions: None Restrictions Weight Bearing Restrictions: No      Mobility  Bed Mobility Overal bed mobility: Independent             General bed mobility comments: Good speed and effort  Transfers Overall transfer level: Independent Equipment used: None             General transfer comment: Good stability and control with transfers  Ambulation/Gait Ambulation/Gait assistance: Independent Gait Distance (Feet): 200 Feet Assistive device: None Gait Pattern/deviations: WFL(Within Functional Limits)   Gait velocity interpretation: >2.62 ft/sec, indicative of community ambulatory General Gait Details: Good cadence and stability during amb  Stairs Stairs: Yes Stairs assistance: Independent Stair Management: One rail Right;No rails Number of Stairs: 4 General stair comments: Good stability ascending and descending steps both with and without rails  Wheelchair Mobility    Modified Rankin (Stroke Patients Only)       Balance Overall balance assessment: Independent(Good stability with feet apart, together, and semi-tandem with eyes open and closed; good stability during functional tasks without UE support)  Pertinent Vitals/Pain Pain Assessment: No/denies pain    Home Living Family/patient expects to be discharged to:: Private residence Living Arrangements: Alone Available Help at Discharge: Family;Available PRN/intermittently Type of Home: House Home Access: Stairs to enter Entrance  Stairs-Rails: None Entrance Stairs-Number of Steps: 2 Home Layout: One level Home Equipment: None      Prior Function Level of Independence: Independent         Comments: Pt is Ind with amb community distances, no fall history, Ind with all ADLs and IADLs     Hand Dominance        Extremity/Trunk Assessment   Upper Extremity Assessment Upper Extremity Assessment: Overall WFL for tasks assessed    Lower Extremity Assessment Lower Extremity Assessment: Overall WFL for tasks assessed       Communication   Communication: No difficulties  Cognition Arousal/Alertness: Awake/alert Behavior During Therapy: WFL for tasks assessed/performed Overall Cognitive Status: Within Functional Limits for tasks assessed                                        General Comments      Exercises     Assessment/Plan    PT Assessment Patent does not need any further PT services  PT Problem List         PT Treatment Interventions      PT Goals (Current goals can be found in the Care Plan section)  Acute Rehab PT Goals PT Goal Formulation: All assessment and education complete, DC therapy    Frequency     Barriers to discharge        Co-evaluation               AM-PAC PT "6 Clicks" Daily Activity  Outcome Measure Difficulty turning over in bed (including adjusting bedclothes, sheets and blankets)?: None Difficulty moving from lying on back to sitting on the side of the bed? : None Difficulty sitting down on and standing up from a chair with arms (e.g., wheelchair, bedside commode, etc,.)?: None Help needed moving to and from a bed to chair (including a wheelchair)?: None Help needed walking in hospital room?: None Help needed climbing 3-5 steps with a railing? : None 6 Click Score: 24    End of Session Equipment Utilized During Treatment: Gait belt Activity Tolerance: Patient tolerated treatment well Patient left: in bed;with call bell/phone within  reach Nurse Communication: Mobility status PT Visit Diagnosis: Muscle weakness (generalized) (M62.81)    Time: 1340-1402 PT Time Calculation (min) (ACUTE ONLY): 22 min   Charges:   PT Evaluation $PT Eval Low Complexity: 1 Low         D. Scott Denzil Bristol PT, DPT 05/24/18, 3:06 PM

## 2018-05-24 NOTE — Progress Notes (Signed)
*  PRELIMINARY RESULTS* Echocardiogram 2D Echocardiogram has been performed.  Sherrie Sport 05/24/2018, 3:04 PM

## 2018-05-24 NOTE — Progress Notes (Signed)
Family Meeting Note  Advance Directive:yes  Today a meeting took place with the Patient.  Patient is able to participate   The following clinical team members were present during this meeting:MD  The following were discussed:Patient's diagnosis: History of lung cancer, COPD, malnutrition, DVT, syncope, Patient's progosis: Unable to determine and Goals for treatment: Full Code  Additional follow-up to be provided: prn  Time spent during discussion:20 minutes  Gorden Harms, MD

## 2018-05-24 NOTE — Consult Note (Addendum)
Referring Physician: Salary, M. MD    Chief Complaint: Syncope/near syncope, dizziness.  HPI: Claudia Chavez is an 62 y.o. female with history of stage III lung cancer; and also stage I right upper lobe status post radiation February 2019.  She also has occlusive DVT involving the popliteal and both left posterior tibial and peroneal veins started on Xarelto. She has been treated in the past with carboplatinum and Taxol but was unable to tolerate due to progressive chemo induced peripheral neuropathy. She is seen in consultation with admitting physician for evaluation of subacute cerebellar infarcts noted on MRI brain. Patient state that she has had syncope and near syncope episodes last month and approximately 2 weeks ago associated with dizziness with no focal neurological deficit. Patient describes associated symptoms preceding the event of  feeling cold or clammy, blurry vision, and palpitations. No shortness of breath or chest pain. She denies associated symptoms following the event of confusion, Injury, nausea or vomiting, feeling cold or clammy palpitations, shortness of breath, chest pain, bladder or bowel incontinence.  Due to her hx of DVT, chest CTA was obtained which did not show evidence of pulmonary embolism. She also had a follow up MRI brain done on  which showed bilateral cerebellar subacute infarcts with nonenhancing lesion left frontal area-cystic. She is fatigued at baseline with c/o dizziness and lightheadedness. She was noted to be orthostatic at one of her oncology clinic visit   Past Medical History:  Diagnosis Date  . Cancer of lower lobe of right lung ([place]) 05/07/2015  . COPD (chronic obstructive pulmonary disease) ([place])   . Non-small cell lung cancer ([place])   . Pneumonia 04/2015    Past Surgical History:  Procedure Laterality Date  . ABDOMINAL HYSTERECTOMY    . ELBOW SURGERY Right 1995  . ELECTROMAGNETIC NAVIGATION BROCHOSCOPY N/A 10/25/2017   Procedure: ELECTROMAGNETIC  NAVIGATION BRONCHOSCOPY;  Surgeon: [name], MD;  Location: [facility] ORS;  Service: Cardiopulmonary;  Laterality: N/A;  . PORTACATH PLACEMENT Right 05/10/2015   Procedure: INSERTION PORT-A-CATH;  Surgeon: Nestor Lewandowsky, MD;  Location: [facility];  Service: General;  Laterality: Right;  Marland Kitchen VIDEO ASSISTED THORACOSCOPY (VATS)/THOROCOTOMY Right 08/18/2015   Procedure: VIDEO ASSISTED THORACOSCOPY (VATS)/THOROCOTOMY;  Surgeon: Nestor Lewandowsky, MD;  Location: [facility];  Service: General;  Laterality: Right;    Family History  Problem Relation Age of Onset  . Stroke Mother   . Lung cancer Father 48   Social History:  reports that she has been smoking cigarettes. She has a 20.00 pack-year smoking history. She has never used smokeless tobacco. She reports that she drinks about 2.0 - 4.0 standard drinks of alcohol per week. She reports that she does not use drugs.  Allergies:  Allergies  Allergen Reactions  . Lyrica [Pregabalin] Other (See Comments)    Made patient feel very dizzy and not feel good.     Medications:  I have reviewed the patient's current medications. Scheduled: . aspirin  300 mg Rectal Daily   Or  . aspirin  325 mg Oral Daily  . cholecalciferol  1,000 Units Oral BID  . dexamethasone  4 mg Oral TID  . fluticasone  2 spray Each Nare Daily  . levETIRAcetam  500 mg Oral BID  . mometasone-formoterol  2 puff Inhalation BID  . Rivaroxaban  15 mg Oral BID  . [START ON 05/31/2018] rivaroxaban  20 mg Oral Daily  . vitamin B-12  1,000 mcg Oral Daily    ROS: History obtained from the patient  General ROS: negative for - chills, fever, night sweats, weight gain or weight loss. Positive for fatigue/malaise Psychological ROS: negative for - behavioral disorder, hallucinations, memory difficulties, mood swings or suicidal ideation Ophthalmic ROS: negative for - double vision, eye pain or loss of vision. Positive for blurry vision ENT ROS: negative for - epistaxis, nasal discharge, oral  lesions, sore throat, tinnitus Positive for dizziness Allergy and Immunology ROS: negative for - hives or itchy/watery eyes Hematological and Lymphatic ROS: negative for - bleeding problems, bruising or swollen lymph nodes Endocrine ROS: negative for - galactorrhea, hair pattern changes, polydipsia/polyuria or temperature intolerance Respiratory ROS: negative for - cough, hemoptysis, shortness of breath or wheezing Cardiovascular ROS: negative for - chest pain, dyspnea on exertion, edema or irregular heartbeat Gastrointestinal ROS: negative for - abdominal pain, diarrhea, hematemesis, nausea/vomiting or stool incontinence Genito-Urinary ROS: negative for - dysuria, hematuria, incontinence or urinary frequency/urgency Musculoskeletal ROS: negative for - joint swelling or muscular weakness Neurological ROS: as noted in HPI Dermatological ROS: negative for rash and skin lesion changes   Physical Examination: Blood pressure (!) 145/78, pulse 94, temperature 97.8 F (36.6 C), resp. rate 18, height 5\' 1"  (1.549 m), weight 47.3 kg, SpO2 99 %.  General Exam Patient looks appropriate of age, well built, nourished and appropriately groomed.  Cardiovascular Exam: S1, S2 heart sounds present Carotid exam revealed no bruit Lung exam was clear to auscultation Fundoscopic exam through undilated pupils did not show papilledema. ? Neurological Exam  Mental Status: Alert, Oriented to time, place, person and situation Attention span and concentration seemed appropriate Memory seemed OK. Intact naming, repetition, comprehension.  Followed 2 step commands - no dysarthria Fund of knowledge seemed appropriate for age and health status.  Cranial Nerves: Olfactory and vagus nerves not are examined Visual fields were full Pupils were equal, round and reactive to light and accommodation Extra-ocular movements are normal Facial sensations are normal Face is symmetric Finger rub was heard symmetric in  both ears Palate and uvular movements are normal and oral sensations are OK Neck muscle strength and shoulder shrug is normal Tongue protrusion and uvular elevation are normal  Motor Exam: Tone is normal in all extremities Muscle strength in all extremities is 5/5. No abnormal movements, fasciculations or atrophy seen  Deep Tendon Reflexes: Right Biceps is 2+, Left Biceps is 2+ Right Triceps is 2+, Left Triceps is 2+ Right Brachioradialis is 2+, Left Brachioradialis is 2+ Right Knee Jerk is 2+, Left Knee Jerk is 2+ Right Ankle Jerk is 2+, Left Ankle Jerk is 2+ Right Toes are down going, Left Toes are down going  Sensory Exam: Sensations were intact to light touch in all extremities Vibration and proprioception are also intact  Co-ordination: Finger to nose is normal  Gait: Gait and station are normal when examined in small exam room Romberg's test is negative  Data Reviewed  Laboratory Studies:  Basic Metabolic Panel: Recent Labs  Lab 05/21/18 1341  NA 132*  K 3.8  CL 98  CO2 24  GLUCOSE 111*  BUN 10  CREATININE 0.56  CALCIUM 8.6*    Liver Function Tests: Recent Labs  Lab 05/21/18 1341  AST 19  ALT 12  ALKPHOS 175*  BILITOT 0.3  PROT 6.9  ALBUMIN 2.5*   No results for input(s): LIPASE, AMYLASE in the last 168 hours. No results for input(s): AMMONIA in the last 168 hours.  CBC: Recent Labs  Lab 05/21/18 1341 05/24/18 0812  WBC 9.6 11.0  NEUTROABS 7.7* 9.9*  HGB 10.7* 10.5*  HCT 30.8* 29.8*  MCV 99.9 100.3*  PLT 563* 629*    Cardiac Enzymes: No results for input(s): CKTOTAL, CKMB, CKMBINDEX, TROPONINI in the last 168 hours.  BNP: Invalid input(s): POCBNP  CBG: No results for input(s): GLUCAP in the last 168 hours.  Microbiology: Results for orders placed or performed during the hospital encounter of 08/18/15  MRSA PCR Screening     Status: None   Collection Time: 08/18/15  8:00 PM  Result Value Ref Range Status   MRSA by PCR  NEGATIVE NEGATIVE Final    Comment:        The GeneXpert MRSA Assay (FDA approved for NASAL specimens only), is one component of a comprehensive MRSA colonization surveillance program. It is not intended to diagnose MRSA infection nor to guide or monitor treatment for MRSA infections.     Coagulation Studies: No results for input(s): LABPROT, INR in the last 72 hours.  Urinalysis: No results for input(s): COLORURINE, LABSPEC, PHURINE, GLUCOSEU, HGBUR, BILIRUBINUR, KETONESUR, PROTEINUR, UROBILINOGEN, NITRITE, LEUKOCYTESUR in the last 168 hours.  Invalid input(s): APPERANCEUR  Lipid Panel: No results found for: CHOL, TRIG, HDL, CHOLHDL, VLDL, LDLCALC  HgbA1C: No results found for: HGBA1C  Urine Drug Screen:  No results found for: LABOPIA, COCAINSCRNUR, LABBENZ, AMPHETMU, THCU, LABBARB  Alcohol Level: No results for input(s): ETH in the last 168 hours.  Imaging: Mr Jeri Cos TG Contrast  Result Date: 05/22/2018 CLINICAL DATA:  62 y/o F; multiple episodes of syncope. History of lung cancer. EXAM: MRI HEAD WITHOUT AND WITH CONTRAST TECHNIQUE: Multiplanar, multiecho pulse sequences of the brain and surrounding structures were obtained without and with intravenous contrast. CONTRAST:  76mL MULTIHANCE GADOBENATE DIMEGLUMINE 529 MG/ML IV SOLN COMPARISON:  04/01/2018 PET-CT. FINDINGS: Brain: Very small chronic infarcts within the bilateral cerebellar hemispheres. Few nonspecific foci of T2 FLAIR hyperintense signal abnormality in subcortical and periventricular white matter are compatible mild chronic microvascular ischemic changes. Mild volume loss of the brain. No extra-axial collection, hydrocephalus, or herniation. Lobulated multi cystic lesion within the left posterior frontal subcortical white matter measuring 13 x 12 x 13 mm (AP x ML x CC series 10, image 18 and series 15, image 15). The fluid components do not demonstrate reduced diffusion. The T2 hyperintense rim components are T2, FLAIR,  diffusion hyperintense but do not demonstrate reduced diffusion on ADC. After administration of intravenous contrast there is no abnormal enhancement. Vascular: Normal flow voids. Skull and upper cervical spine: 7 mm nonspecific enhancing focus near left lambdoid suture (series 16, image 115). Sinuses/Orbits: Negative. Other: None. IMPRESSION: 1. Lobulated multi-cystic lesion in the left posterior frontal subcortical white matter measuring up to 13 mm. No reduced diffusion, blood products, or enhancement. Given additional small infarcts in the brain, subacute infarction is suspected. Some differential considerations include cystic CNS glioma/oligodendroglioma, nonenhancing intracranial metastasis, or non pyogenic abscess. Follow-up is recommended. 2. Small chronic infarcts in the bilateral cerebellar hemispheres. Mild chronic microvascular ischemic changes and volume loss of the brain. 3. Nonspecific 7 mm focus of calvarial enhancement near left lambdoid suture. Differential includes fat poor hemangioma or metastasis. Attention at follow-up. Electronically Signed   By: Kristine Garbe M.D.   On: 05/22/2018 18:33    Assessment: 62 y.o. female with history of stage III lung cancer and also stage I right upper lobe status post radiation February 2019 presenting with episodes of syncope/near syncope likely orthostatic and dizziness. Recent MRI brain done on 05/22/18 showed left posterior frontal subcortical white matter lesion  concerning for subacute infarcts, mets, abscess or glioma. She also has small chronic infarcts in the bilateral cerebellar hemispheres. Follow up CTA head and neck negative for large vessel occlusion. Noted moderate to severe right and mild-to-moderate left Vertebral Artery origin atherosclerotic stenosis. She has history of occlusive DVT involving the popliteal and both left posterior tibial and peroneal veins started on Xarelto. Echocardiogram currently pending   Plan: 1. HgbA1c,  fasting lipid panel pending 2. PT consult, OT consult, Speech consult 3. Echocardiogram pending 4. Continue medical management on anticoagulant Xarelto with intensive management of vascular risk factor to keep systolic BP (SBP) <549 mm Hg (130 mm Hg if diabetic) and low density lipoprotein (LDL) <70 mg/dl, and lifestyle modification.  5. Due to possible brain mets and risk for seizure, will treat Prophylactic with Keppra 500 mg bid for atleast 2-3 months with a follow up MRI brain recommended. If no extension of lesion may consider discontinuing AED. 7. Telemetry monitoring 8. Frequent neuro checks   This patient was staffed with Dr. Irish Elders, Alease Frame who personally evaluated patient, reviewed documentation and agreed with assessment and plan of care as above.  Rufina Falco, DNP, FNP-BC Board certified Nurse Practitioner Neurology Department   05/24/2018, 12:34 PM

## 2018-05-24 NOTE — Assessment & Plan Note (Addendum)
#   Right upper lobe stage I lung cancer [biopsy nondiagnostic]- s/p feb 22nd SBRT; CT scan suggestive of radiation changes; PET scan April 01, 2018 also suggestive of radiation changes; less likely recurrence. STABLE.   #Left lung stage III lung cancer-STABLE  # Presyncope/syncopal episodes-question etiology; orthostasis versus cardiac etiology.  Patient is currently on dexamethasone for possible adrenal insufficiency; which could be stopped at this time.  Recommend telemetry.  #Bilateral cerebellar subacute infarcts-question etiology discussed with Dr.Z/neurology recommends CT head and neck angiogram.   # 13 millimeters nonenhancing lesion left frontal area-cystic; discussed with Dr. Lysle Morales neurosurgery; [facility] who recommends follow-up imaging in approximately 2 to 3 months.  #Left upper lobe 5 mm nodule -new primary versus metastasis versus others.    #COPD stable.  # Post thoracotomy syndrome-stable.  # I reviewed the blood work- with the patient in detail; also reviewed the imaging independently [as summarized above]; and with the patient in detail.   #Discussed with Dr. Bridgett Larsson admitting hospitalist; kindly agrees to admit the patient.  Follow-up to be decided based on discharge in the hospital.  Discussed with the patient the above plan in detail.  She agrees.   # 40 minutes face-to-face with the patient discussing the above plan of care; more than 50% of time spent on prognosis/ natural history; counseling and coordination.

## 2018-05-24 NOTE — Progress Notes (Signed)
OT Cancellation Note  Patient Details Name: Claudia Chavez MRN: [id number] DOB: Jun 14, 1956   Cancelled Treatment:    Reason Eval/Treat Not Completed: OT screened, no needs identified, will sign off.  Order received and chart reviewed.  Pt screened for OT needs since she did very well with PT and no needs identified for full OT evaluation.  Thank you for the referral and please consult if status changes and OT is needed again.  Chrys Racer, OTR/L ascom [phone] 05/24/18, 4:05 PM

## 2018-05-24 NOTE — Progress Notes (Signed)
Dr. Rogue Bussing spoke with Dr. Bridgett Larsson - will admit patient to tele at [facility] for work up for syncopal episodes. Pt is stable at this time. She would like to go home before going to the admit desk to register. Dr. B is agreeable to this plan.

## 2018-05-24 NOTE — H&P (Signed)
[facility] [place] NAME: Claudia Chavez    MR#:  [id number]  DATE OF BIRTH:  [birth year]  DATE OF ADMISSION:  05/24/2018  PRIMARY CARE PHYSICIAN: Maryland Pink, MD   REQUESTING/REFERRING PHYSICIAN:   CHIEF COMPLAINT:  No chief complaint on file.   HISTORY OF PRESENT ILLNESS: Claudia Chavez  is a 62 y.o. female with a known history per below which includes history of squamous cell lung cancer status post chemo/radiation-currently in remission, history of DVT, recent CVA, COPD, was asked to admit patient by Dr. Wilmer Floor to the hospital with plans for work-up for recurrent several month history of near syncope/requested neurology consultation with CT angios of the head/neck, MRI done August 21 noted for left frontal 13 mm stroke versus cancer/7 mm focal calvarial lesion suspicious for hemangioma versus met, patient evaluated in the floor, in no apparent distress, resting carefully in bed, patient states that she has had these near syncopal events for 3 to 4 months, can occur at any time, typically when standing, not with changing position, denies chest pain or shortness of breath with these events, patient is now been admitted for acute recurrent near syncope events as well as recent acute stroke.  PAST MEDICAL HISTORY:   Past Medical History:  Diagnosis Date  . Cancer of lower lobe of right lung ([place]) 05/07/2015  . COPD (chronic obstructive pulmonary disease) ([place])   . Non-small cell lung cancer ([place])   . Pneumonia 04/2015    PAST SURGICAL HISTORY:  Past Surgical History:  Procedure Laterality Date  . ABDOMINAL HYSTERECTOMY    . ELBOW SURGERY Right 1995  . ELECTROMAGNETIC NAVIGATION BROCHOSCOPY N/A 10/25/2017   Procedure: ELECTROMAGNETIC NAVIGATION BRONCHOSCOPY;  Surgeon: [name], MD;  Location: [facility] ORS;  Service: Cardiopulmonary;  Laterality: N/A;  . PORTACATH PLACEMENT Right 05/10/2015   Procedure: INSERTION PORT-A-CATH;   Surgeon: Nestor Lewandowsky, MD;  Location: [facility] OR[facility];  Service: General;  Laterality: Right;  Marland Kitchen VIDEO ASSISTED THORACOSCOPY (VATS)/THOROCOTOMY Right 08/18/2015   Procedure: VIDEO ASSISTED THORACOSCOPY (VATS)/THOROCOTOMY;  Surgeon: Nestor Lewandowsky, MD;  Location: [facility] OR[facility];  Service: General;  Laterality: Right;    SOCIAL HISTORY:  Social History   Tobacco Use  . Smoking status: Current Every Day Smoker    Packs/day: 0.50    Years: 40.00    Pack years: 20.00    Types: Cigarettes  . Smokeless tobacco: Never Used  . Tobacco comment: "1 pk or less per day"  Substance Use Topics  . Alcohol use: Yes    Alcohol/week: 2.0 - 4.0 standard drinks    Types: 2 - 4 Cans of beer per week    Comment: beer-occasionally    FAMILY HISTORY:  Family History  Problem Relation Age of Onset  . Stroke Mother   . Lung cancer Father 47    DRUG ALLERGIES:  Allergies  Allergen Reactions  . Lyrica [Pregabalin] Other (See Comments)    Made patient feel very dizzy and not feel good.     REVIEW OF SYSTEMS:   CONSTITUTIONAL: No fever, fatigue or weakness.  EYES: No blurred or double vision.  EARS, NOSE, AND THROAT: No tinnitus or ear pain.  RESPIRATORY: No cough, shortness of breath, wheezing or hemoptysis.  CARDIOVASCULAR: No chest pain, orthopnea, edema.  GASTROINTESTINAL: No nausea, vomiting, diarrhea or abdominal pain.  GENITOURINARY: No dysuria, hematuria.  ENDOCRINE: No polyuria, nocturia,  HEMATOLOGY: No anemia, easy bruising or bleeding SKIN: No rash or lesion. MUSCULOSKELETAL: No joint pain or arthritis.  NEUROLOGIC: No tingling, numbness, weakness. + Near loss of consciousness/fainting  pSYCHIATRY: No anxiety or depression.   MEDICATIONS AT HOME:  Prior to Admission medications   Medication Sig Start Date End Date Taking? Authorizing Provider  albuterol (PROVENTIL HFA;VENTOLIN HFA) 108 (90 Base) MCG/ACT inhaler Inhale 1 puff into the lungs every 6 (six) hours as needed for wheezing or  shortness of breath.    [provider]  Cholecalciferol (VITAMIN D3) 1000 units CAPS Take by mouth 2 (two) times daily.    [provider]  dexamethasone (DECADRON) 4 MG tablet Take 1 tablet (4 mg total) by mouth 3 (three) times daily. 05/21/18   Jacquelin Hawking, NP  fluticasone (FLONASE) 50 MCG/ACT nasal spray SPRAY 2 SPRAYS INTO EACH NOSTRIL EVERY DAY 01/29/18   [provider]  Fluticasone-Salmeterol (ADVAIR DISKUS) 250-50 MCG/DOSE AEPB INHALE 1 INHALATION INTO THE LUNGS EVERY 12 (TWELVE) HOURS. 09/05/17   [provider]  ipratropium-albuterol (DUONEB) 0.5-2.5 (3) MG/3ML SOLN TAKE 3 MLS BY NEBULIZATION EVERY 4 (FOUR) HOURS AS NEEDED. 03/28/18   Cammie Sickle, MD  Rivaroxaban 15 & 20 MG TBPK Take as directed on package 05/08/18   Jacquelin Hawking, NP  vitamin B-12 (CYANOCOBALAMIN) 1000 MCG tablet Take 1,000 mcg by mouth daily.    [provider]      PHYSICAL EXAMINATION:   VITAL SIGNS: Blood pressure 138/71, pulse 84, temperature 97.8 F (36.6 C), resp. rate 18, height 5' 1"  (1.549 m), weight 47.3 kg, SpO2 97 %.  GENERAL:  62 y.o.-year-old patient lying in the bed with no acute distress.  Frail/cachectic appearing EYES: Pupils equal, round, reactive to light and accommodation. No scleral icterus. Extraocular muscles intact.  HEENT: Head atraumatic, normocephalic. Oropharynx and nasopharynx clear.  NECK:  Supple, no jugular venous distention. No thyroid enlargement, no tenderness.  LUNGS: Normal breath sounds bilaterally, no wheezing, rales,rhonchi or crepitation. No use of accessory muscles of respiration.  CARDIOVASCULAR: S1, S2 normal. No murmurs, rubs, or gallops.  ABDOMEN: Soft, nontender, nondistended. Bowel sounds present. No organomegaly or mass.  EXTREMITIES: No pedal edema, cyanosis, or clubbing.  NEUROLOGIC: Cranial nerves II through XII are intact. MAES. Gait not checked.  PSYCHIATRIC: The patient is alert and oriented x 3.   SKIN: No obvious rash, lesion, or ulcer.   LABORATORY PANEL:   CBC Recent Labs  Lab 05/21/18 1341 05/24/18 0812  WBC 9.6 11.0  HGB 10.7* 10.5*  HCT 30.8* 29.8*  PLT 563* 629*  MCV 99.9 100.3*  MCH 34.6* 35.4*  MCHC 34.7 35.3  RDW 12.9 13.1  LYMPHSABS 0.9* 0.6*  MONOABS 0.8 0.5  EOSABS 0.1 0.0  BASOSABS 0.1 0.0   ------------------------------------------------------------------------------------------------------------------  Chemistries  Recent Labs  Lab 05/21/18 1341  NA 132*  K 3.8  CL 98  CO2 24  GLUCOSE 111*  BUN 10  CREATININE 0.56  CALCIUM 8.6*  AST 19  ALT 12  ALKPHOS 175*  BILITOT 0.3   ------------------------------------------------------------------------------------------------------------------ estimated creatinine clearance is 53.7 mL/min (by C-G formula based on SCr of 0.56 mg/dL). ------------------------------------------------------------------------------------------------------------------ No results for input(s): TSH, T4TOTAL, T3FREE, THYROIDAB in the last 72 hours.  Invalid input(s): FREET3   Coagulation profile No results for input(s): INR, PROTIME in the last 168 hours. ------------------------------------------------------------------------------------------------------------------- No results for input(s): DDIMER in the last 72 hours. -------------------------------------------------------------------------------------------------------------------  Cardiac Enzymes No results for input(s): CKMB, TROPONINI, MYOGLOBIN in the last 168 hours.  Invalid input(s): CK ------------------------------------------------------------------------------------------------------------------ Invalid input(s): POCBNP  ---------------------------------------------------------------------------------------------------------------  Urinalysis No results found for: COLORURINE, APPEARANCEUR,  LABSPEC, PHURINE, GLUCOSEU, HGBUR, BILIRUBINUR,  KETONESUR, PROTEINUR, UROBILINOGEN, NITRITE, LEUKOCYTESUR   RADIOLOGY: Ct Angio Head W Or Wo Contrast  Result Date: 05/24/2018 CLINICAL DATA:  62 year old female with nonenhancing left frontal white matter lesion on recent MRI favored to be subacute infarct. Recent dizziness, near syncope, weakness. Right lung cancer. EXAM: CT ANGIOGRAPHY HEAD AND NECK TECHNIQUE: Multidetector CT imaging of the head and neck was performed using the standard protocol during bolus administration of intravenous contrast. Multiplanar CT image reconstructions and MIPs were obtained to evaluate the vascular anatomy. Carotid stenosis measurements (when applicable) are obtained utilizing NASCET criteria, using the distal internal carotid diameter as the denominator. CONTRAST:  9m ISOVUE-370 IOPAMIDOL (ISOVUE-370) INJECTION 76% COMPARISON:  Brain MRI 05/22/2018. PET-CT 04/01/2018. Chest CTA 05/08/2018. FINDINGS: CT HEAD Brain: Small area of hypodensity in the left anterior corona radiata and subcortical white matter corresponding to the MRI lesion with no surrounding vasogenic edema and no mass effect. Small chronic cerebellar infarcts were better demonstrated by MRI. Otherwise normal for age gray-white matter differentiation throughout the brain. No midline shift, ventriculomegaly, mass effect, evidence of mass lesion, intracranial hemorrhage or evidence of cortically based acute infarction. Calvarium and skull base: Within normal limits. Paranasal sinuses: Paranasal sinuses and mastoids are stable and well pneumatized. Orbits: Visualized orbits and scalp soft tissues are within normal limits. CTA NECK Skeleton: Osteopenia. No acute or suspicious osseous lesion identified. Upper chest: Partially visible abnormal right lung appears not significantly changed since the 05/08/2018 CTA. Underlying centrilobular emphysema. Small superior mediastinal lymph nodes are stable. Other neck: Right IJ approach porta cath. Otherwise negative. No  neck mass or lymphadenopathy. Aortic arch: Calcified aortic atherosclerosis. 3 vessel arch configuration. Right carotid system: No brachiocephalic artery or right CCA origin stenosis despite calcified plaque. Mild soft and calcified plaque at the right carotid bifurcation and proximal right ICA without stenosis. Left carotid system: No left CCA origin stenosis despite soft and calcified plaque. Mostly calcified atherosclerosis at the left ICA origin and bulb without stenosis. Vertebral arteries: No proximal right subclavian artery stenosis despite atherosclerosis. Soft and calcified plaque at the right vertebral artery origin resulting in moderate to severe stenosis (series 12, image 151). Right vertebral artery V2 segment tortuosity with no other plaque or stenosis to the skull base. Up to 50% stenosis of the proximal left subclavian artery due to plaque. Calcified plaque at the left vertebral artery origin resulting in mild to moderate stenosis on series 12, image 147. Codominant vertebral arteries. No other left vertebral artery plaque or stenosis to the skull base. CTA HEAD Posterior circulation: Codominant distal vertebral arteries are patent without plaque or stenosis. Normal left PICA origin. Dominant appearing right AICA. Patent vertebrobasilar junction and basilar artery without stenosis. Normal SCA and PCA origins. Posterior communicating arteries are diminutive or absent. Bilateral PCA branches are within normal limits. Anterior circulation: Both ICA siphons are patent. Mild calcified plaque on the left. Minimal to mild left supraclinoid ICA stenosis at the anterior genu. Similar calcified plaque on the right with mild right cavernous segment stenosis. Normal ophthalmic artery origins. Normal carotid termini, MCA and ACA origins. Mildly tortuous A1 segments. Anterior communicating artery and bilateral ACA branches are within normal limits. Right MCA M1 segment, bifurcation, and right MCA branches are  within normal limits. Left MCA M1 segment and bifurcation are patent without stenosis. Left MCA branches are within normal limits. Venous sinuses: Patent. Anatomic variants: None. Delayed phase: No abnormal enhancement identified (series 16, image 17). Review of the MIP images  confirms the above findings IMPRESSION: 1. Negative for large vessel occlusion. 2. Bilateral Carotid atherosclerosis with no hemodynamically significant stenosis; mild bilateral ICA siphon stenosis. - otherwise negative Anterior circulation. 3. Moderate to severe right and mild-to-moderate left Vertebral Artery origin atherosclerotic stenosis. - otherwise negative Posterior circulation. 4. Stable hypodensity in the left frontal lobe white matter on CT corresponding to the recent MRI lesion. No associated edema, mass effect, or enhancement. - no new acute intracranial abnormality. 5. Partially visible abnormal right lung appears stable since the chest CTA on 05/08/2018. Electronically Signed   By: Genevie Ann M.D.   On: 05/24/2018 13:17   Dg Chest 2 View  Result Date: 05/24/2018 CLINICAL DATA:  Syncopal episodes.  History of lung cancer. EXAM: CHEST - 2 VIEW COMPARISON:  Chest CT 05/08/2018.  Radiographs 10/12/2017. FINDINGS: Right IJ Port-A-Cath tip extends to the superior cavoatrial junction. The heart size and mediastinal contours are stable. There has been partial clearing of the right upper lobe and superior segment lower lobe consolidation compared with the CT of 2 weeks ago. The left lung is clear. There is no pneumothorax or significant pleural effusion. The bones appear unchanged. IMPRESSION: Partial clearing of right lung airspace disease compared with CT of 2 weeks ago, most consistent with improving pneumonia. Some of this could be related to previous radiation therapy. Continued follow-up recommended to document complete clearing. Electronically Signed   By: Richardean Sale M.D.   On: 05/24/2018 12:56   Ct Angio Neck W Or Wo  Contrast  Result Date: 05/24/2018 CLINICAL DATA:  62 year old female with nonenhancing left frontal white matter lesion on recent MRI favored to be subacute infarct. Recent dizziness, near syncope, weakness. Right lung cancer. EXAM: CT ANGIOGRAPHY HEAD AND NECK TECHNIQUE: Multidetector CT imaging of the head and neck was performed using the standard protocol during bolus administration of intravenous contrast. Multiplanar CT image reconstructions and MIPs were obtained to evaluate the vascular anatomy. Carotid stenosis measurements (when applicable) are obtained utilizing NASCET criteria, using the distal internal carotid diameter as the denominator. CONTRAST:  43m ISOVUE-370 IOPAMIDOL (ISOVUE-370) INJECTION 76% COMPARISON:  Brain MRI 05/22/2018. PET-CT 04/01/2018. Chest CTA 05/08/2018. FINDINGS: CT HEAD Brain: Small area of hypodensity in the left anterior corona radiata and subcortical white matter corresponding to the MRI lesion with no surrounding vasogenic edema and no mass effect. Small chronic cerebellar infarcts were better demonstrated by MRI. Otherwise normal for age gray-white matter differentiation throughout the brain. No midline shift, ventriculomegaly, mass effect, evidence of mass lesion, intracranial hemorrhage or evidence of cortically based acute infarction. Calvarium and skull base: Within normal limits. Paranasal sinuses: Paranasal sinuses and mastoids are stable and well pneumatized. Orbits: Visualized orbits and scalp soft tissues are within normal limits. CTA NECK Skeleton: Osteopenia. No acute or suspicious osseous lesion identified. Upper chest: Partially visible abnormal right lung appears not significantly changed since the 05/08/2018 CTA. Underlying centrilobular emphysema. Small superior mediastinal lymph nodes are stable. Other neck: Right IJ approach porta cath. Otherwise negative. No neck mass or lymphadenopathy. Aortic arch: Calcified aortic atherosclerosis. 3 vessel arch  configuration. Right carotid system: No brachiocephalic artery or right CCA origin stenosis despite calcified plaque. Mild soft and calcified plaque at the right carotid bifurcation and proximal right ICA without stenosis. Left carotid system: No left CCA origin stenosis despite soft and calcified plaque. Mostly calcified atherosclerosis at the left ICA origin and bulb without stenosis. Vertebral arteries: No proximal right subclavian artery stenosis despite atherosclerosis. Soft and calcified plaque at the  right vertebral artery origin resulting in moderate to severe stenosis (series 12, image 151). Right vertebral artery V2 segment tortuosity with no other plaque or stenosis to the skull base. Up to 50% stenosis of the proximal left subclavian artery due to plaque. Calcified plaque at the left vertebral artery origin resulting in mild to moderate stenosis on series 12, image 147. Codominant vertebral arteries. No other left vertebral artery plaque or stenosis to the skull base. CTA HEAD Posterior circulation: Codominant distal vertebral arteries are patent without plaque or stenosis. Normal left PICA origin. Dominant appearing right AICA. Patent vertebrobasilar junction and basilar artery without stenosis. Normal SCA and PCA origins. Posterior communicating arteries are diminutive or absent. Bilateral PCA branches are within normal limits. Anterior circulation: Both ICA siphons are patent. Mild calcified plaque on the left. Minimal to mild left supraclinoid ICA stenosis at the anterior genu. Similar calcified plaque on the right with mild right cavernous segment stenosis. Normal ophthalmic artery origins. Normal carotid termini, MCA and ACA origins. Mildly tortuous A1 segments. Anterior communicating artery and bilateral ACA branches are within normal limits. Right MCA M1 segment, bifurcation, and right MCA branches are within normal limits. Left MCA M1 segment and bifurcation are patent without stenosis. Left MCA  branches are within normal limits. Venous sinuses: Patent. Anatomic variants: None. Delayed phase: No abnormal enhancement identified (series 16, image 17). Review of the MIP images confirms the above findings IMPRESSION: 1. Negative for large vessel occlusion. 2. Bilateral Carotid atherosclerosis with no hemodynamically significant stenosis; mild bilateral ICA siphon stenosis. - otherwise negative Anterior circulation. 3. Moderate to severe right and mild-to-moderate left Vertebral Artery origin atherosclerotic stenosis. - otherwise negative Posterior circulation. 4. Stable hypodensity in the left frontal lobe white matter on CT corresponding to the recent MRI lesion. No associated edema, mass effect, or enhancement. - no new acute intracranial abnormality. 5. Partially visible abnormal right lung appears stable since the chest CTA on 05/08/2018. Electronically Signed   By: Genevie Ann M.D.   On: 05/24/2018 13:17   Mr Jeri Cos PQ Contrast  Result Date: 05/22/2018 CLINICAL DATA:  63 y/o F; multiple episodes of syncope. History of lung cancer. EXAM: MRI HEAD WITHOUT AND WITH CONTRAST TECHNIQUE: Multiplanar, multiecho pulse sequences of the brain and surrounding structures were obtained without and with intravenous contrast. CONTRAST:  35m MULTIHANCE GADOBENATE DIMEGLUMINE 529 MG/ML IV SOLN COMPARISON:  04/01/2018 PET-CT. FINDINGS: Brain: Very small chronic infarcts within the bilateral cerebellar hemispheres. Few nonspecific foci of T2 FLAIR hyperintense signal abnormality in subcortical and periventricular white matter are compatible mild chronic microvascular ischemic changes. Mild volume loss of the brain. No extra-axial collection, hydrocephalus, or herniation. Lobulated multi cystic lesion within the left posterior frontal subcortical white matter measuring 13 x 12 x 13 mm (AP x ML x CC series 10, image 18 and series 15, image 15). The fluid components do not demonstrate reduced diffusion. The T2 hyperintense rim  components are T2, FLAIR, diffusion hyperintense but do not demonstrate reduced diffusion on ADC. After administration of intravenous contrast there is no abnormal enhancement. Vascular: Normal flow voids. Skull and upper cervical spine: 7 mm nonspecific enhancing focus near left lambdoid suture (series 16, image 115). Sinuses/Orbits: Negative. Other: None. IMPRESSION: 1. Lobulated multi-cystic lesion in the left posterior frontal subcortical white matter measuring up to 13 mm. No reduced diffusion, blood products, or enhancement. Given additional small infarcts in the brain, subacute infarction is suspected. Some differential considerations include cystic CNS glioma/oligodendroglioma, nonenhancing intracranial metastasis, or non  pyogenic abscess. Follow-up is recommended. 2. Small chronic infarcts in the bilateral cerebellar hemispheres. Mild chronic microvascular ischemic changes and volume loss of the brain. 3. Nonspecific 7 mm focus of calvarial enhancement near left lambdoid suture. Differential includes fat poor hemangioma or metastasis. Attention at follow-up. Electronically Signed   By: Kristine Garbe M.D.   On: 05/22/2018 18:33    EKG: Orders placed or performed in visit on 05/08/18  . EKG 12-Lead  . EKG 12-Lead  . EKG 12-Lead    IMPRESSION AND PLAN: *Acute recurrent near syncopal events *Acute recent cerebrovascular accident *History of squamous cell lung cancer status post chemo/radiation *DVT  Admit to observation unit on our CVA protocol, consult neurology, check CT angios the head/neck, aspirin daily, statin therapy, on Xarelto, check orthostatics, check a.m. cortisol level, physical therapy to evaluate/treat, dietary consulted for frailty, check echocardiogram, and continue close medical monitoring   All the records are reviewed and case discussed with ED provider. Management plans discussed with the patient, family and they are in agreement.  CODE STATUS:full     Code Status Orders  (From admission, onward)         Start     Ordered   05/24/18 1141  Full code  Continuous     05/24/18 1141        Code Status History    Date Active Date Inactive Code Status Order ID Comments User Context   08/18/2015 2014 08/23/2015 2219 Full Code [id number]  Nestor Lewandowsky, MD Inpatient    Advance Directive Documentation     Most Recent Value  Type of Advance Directive  Living will, Healthcare Power of Attorney  Pre-existing out of facility DNR order (yellow form or pink MOST form)  -  "MOST" Form in Place?  -       TOTAL TIME TAKING CARE OF THIS PATIENT: 45 minutes.    Avel Peace Salary M.D on 05/24/2018   Between 7am to 6pm - Pager - [phone]  After 6pm go to [email or web address] - password [email or web address] [facility]  Office  [phone]  CC: Primary care physician; Maryland Pink, MD   Note: This dictation was prepared with Dragon dictation along with smaller phrase technology. Any transcriptional errors that result from this process are unintentional.

## 2018-05-24 NOTE — Progress Notes (Signed)
Patient arrived on unit escorted by volunteer services. She is awake, alert, oriented and able to walk to her room. Vitals were WDL and she denies pain. Patient oriented to call bell, bed, and unit. Admitting MD paged, awaiting orders. Will continue to monitor.

## 2018-05-25 DIAGNOSIS — J449 Chronic obstructive pulmonary disease, unspecified: Secondary | ICD-10-CM | POA: Diagnosis not present

## 2018-05-25 DIAGNOSIS — R55 Syncope and collapse: Secondary | ICD-10-CM | POA: Diagnosis not present

## 2018-05-25 DIAGNOSIS — I63542 Cerebral infarction due to unspecified occlusion or stenosis of left cerebellar artery: Secondary | ICD-10-CM | POA: Diagnosis not present

## 2018-05-25 DIAGNOSIS — C3490 Malignant neoplasm of unspecified part of unspecified bronchus or lung: Secondary | ICD-10-CM | POA: Diagnosis not present

## 2018-05-25 DIAGNOSIS — I639 Cerebral infarction, unspecified: Secondary | ICD-10-CM | POA: Diagnosis not present

## 2018-05-25 LAB — LIPID PANEL
CHOL/HDL RATIO: 2.4 ratio
CHOLESTEROL: 134 mg/dL (ref 0–200)
HDL: 56 mg/dL (ref >40)
LDL Cholesterol: 69 mg/dL (ref 0–99)
TRIGLYCERIDES: 43 mg/dL (ref <150)
VLDL: 9 mg/dL (ref 0–40)

## 2018-05-25 LAB — HEMOGLOBIN A1C
Hgb A1c MFr Bld: 5.6 % (ref 4.8–5.6)
MEAN PLASMA GLUCOSE: 114.02 mg/dL

## 2018-05-25 MED ORDER — ROSUVASTATIN CALCIUM 20 MG PO TABS: 20.0000 mg | ORAL_TABLET | Freq: Every day | ORAL | 0 refills | Status: DC

## 2018-05-25 MED ORDER — LEVETIRACETAM 500 MG PO TABS: 500.0000 mg | ORAL_TABLET | Freq: Two times a day (BID) | ORAL | 3 refills | Status: DC

## 2018-05-25 NOTE — Consult Note (Signed)
Imaging discussed and pt is at baseline  Past Medical History:  Diagnosis Date  . Cancer of lower lobe of right lung ([place]) 05/07/2015  . COPD (chronic obstructive pulmonary disease) ([place])   . Non-small cell lung cancer ([place])   . Pneumonia 04/2015    Past Surgical History:  Procedure Laterality Date  . ABDOMINAL HYSTERECTOMY    . ELBOW SURGERY Right 1995  . ELECTROMAGNETIC NAVIGATION BROCHOSCOPY N/A 10/25/2017   Procedure: ELECTROMAGNETIC NAVIGATION BRONCHOSCOPY;  Surgeon: [name], MD;  Location: [facility];  Service: Cardiopulmonary;  Laterality: N/A;  . PORTACATH PLACEMENT Right 05/10/2015   Procedure: INSERTION PORT-A-CATH;  Surgeon: Nestor Lewandowsky, MD;  Location: [facility];  Service: General;  Laterality: Right;  Marland Kitchen VIDEO ASSISTED THORACOSCOPY (VATS)/THOROCOTOMY Right 08/18/2015   Procedure: VIDEO ASSISTED THORACOSCOPY (VATS)/THOROCOTOMY;  Surgeon: Nestor Lewandowsky, MD;  Location: [facility];  Service: General;  Laterality: Right;    Family History  Problem Relation Age of Onset  . Stroke Mother   . Lung cancer Father 61   Social History:  reports that she has been smoking cigarettes. She has a 20.00 pack-year smoking history. She has never used smokeless tobacco. She reports that she drinks about 2.0 - 4.0 standard drinks of alcohol per week. She reports that she does not use drugs.  Allergies:  Allergies  Allergen Reactions  . Lyrica [Pregabalin] Other (See Comments)    Made patient feel very dizzy and not feel good.     Medications:  I have reviewed the patient's current medications. Scheduled: . aspirin  300 mg Rectal Daily   Or  . aspirin  325 mg Oral Daily  . cholecalciferol  1,000 Units Oral BID  . dexamethasone  4 mg Oral TID  . fluticasone  2 spray Each Nare Daily  . levETIRAcetam  500 mg Oral BID  . mometasone-formoterol  2 puff Inhalation BID  . Rivaroxaban  15 mg Oral BID  . [START ON 05/31/2018] rivaroxaban  20 mg Oral Daily  . vitamin B-12  1,000 mcg Oral Daily       Physical Examination: Blood pressure 123/71, pulse 75, temperature 98.3 F (36.8 C), temperature source Oral, resp. rate 16, height 5\' 1"  (1.549 m), weight 47.3 kg, SpO2 98 %.  General Exam Patient looks appropriate of age, well built, nourished and appropriately groomed.  Cardiovascular Exam: S1, S2 heart sounds present Carotid exam revealed no bruit Lung exam was clear to auscultation Fundoscopic exam through undilated pupils did not show papilledema. ? Neurological Exam  Mental Status: Alert, Oriented to time, place, person and situation Attention span and concentration seemed appropriate Memory seemed OK. Intact naming, repetition, comprehension.  Followed 2 step commands - no dysarthria Fund of knowledge seemed appropriate for age and health status.  Cranial Nerves: Olfactory and vagus nerves not are examined Visual fields were full Pupils were equal, round and reactive to light and accommodation Extra-ocular movements are normal Facial sensations are normal Face is symmetric Finger rub was heard symmetric in both ears Palate and uvular movements are normal and oral sensations are OK Neck muscle strength and shoulder shrug is normal Tongue protrusion and uvular elevation are normal  Motor Exam: Tone is normal in all extremities Muscle strength in all extremities is 5/5. No abnormal movements, fasciculations or atrophy seen  Deep Tendon Reflexes: Right Biceps is 2+, Left Biceps is 2+ Right Triceps is 2+, Left Triceps is 2+ Right Brachioradialis is 2+, Left Brachioradialis is 2+ Right Knee Jerk is 2+, Left Knee Jerk is  2+ Right Ankle Jerk is 2+, Left Ankle Jerk is 2+ Right Toes are down going, Left Toes are down going  Sensory Exam: Sensations were intact to light touch in all extremities Vibration and proprioception are also intact  Co-ordination: Finger to nose is normal  Gait: Gait and station are normal when examined in small exam  room Romberg's test is negative  Data Reviewed  Laboratory Studies:  Basic Metabolic Panel: Recent Labs  Lab 05/21/18 1341  NA 132*  K 3.8  CL 98  CO2 24  GLUCOSE 111*  BUN 10  CREATININE 0.56  CALCIUM 8.6*    Liver Function Tests: Recent Labs  Lab 05/21/18 1341  AST 19  ALT 12  ALKPHOS 175*  BILITOT 0.3  PROT 6.9  ALBUMIN 2.5*   No results for input(s): LIPASE, AMYLASE in the last 168 hours. No results for input(s): AMMONIA in the last 168 hours.  CBC: Recent Labs  Lab 05/21/18 1341 05/24/18 0812  WBC 9.6 11.0  NEUTROABS 7.7* 9.9*  HGB 10.7* 10.5*  HCT 30.8* 29.8*  MCV 99.9 100.3*  PLT 563* 629*    Cardiac Enzymes: No results for input(s): CKTOTAL, CKMB, CKMBINDEX, TROPONINI in the last 168 hours.  BNP: Invalid input(s): POCBNP  CBG: No results for input(s): GLUCAP in the last 168 hours.  Microbiology: Results for orders placed or performed during the hospital encounter of 08/18/15  MRSA PCR Screening     Status: None   Collection Time: 08/18/15  8:00 PM  Result Value Ref Range Status   MRSA by PCR NEGATIVE NEGATIVE Final    Comment:        The GeneXpert MRSA Assay (FDA approved for NASAL specimens only), is one component of a comprehensive MRSA colonization surveillance program. It is not intended to diagnose MRSA infection nor to guide or monitor treatment for MRSA infections.     Coagulation Studies: No results for input(s): LABPROT, INR in the last 72 hours.  Urinalysis: No results for input(s): COLORURINE, LABSPEC, PHURINE, GLUCOSEU, HGBUR, BILIRUBINUR, KETONESUR, PROTEINUR, UROBILINOGEN, NITRITE, LEUKOCYTESUR in the last 168 hours.  Invalid input(s): APPERANCEUR  Lipid Panel:    Component Value Date/Time   CHOL 134 05/25/2018 0649   TRIG 43 05/25/2018 0649   HDL 56 05/25/2018 0649   CHOLHDL 2.4 05/25/2018 0649   VLDL 9 05/25/2018 0649   LDLCALC 69 05/25/2018 0649    HgbA1C: No results found for: HGBA1C  Urine Drug  Screen:  No results found for: LABOPIA, COCAINSCRNUR, LABBENZ, AMPHETMU, THCU, LABBARB  Alcohol Level: No results for input(s): ETH in the last 168 hours.  Imaging: Ct Angio Head W Or Wo Contrast  Result Date: 05/24/2018 CLINICAL DATA:  62 year old female with nonenhancing left frontal white matter lesion on recent MRI favored to be subacute infarct. Recent dizziness, near syncope, weakness. Right lung cancer. EXAM: CT ANGIOGRAPHY HEAD AND NECK TECHNIQUE: Multidetector CT imaging of the head and neck was performed using the standard protocol during bolus administration of intravenous contrast. Multiplanar CT image reconstructions and MIPs were obtained to evaluate the vascular anatomy. Carotid stenosis measurements (when applicable) are obtained utilizing NASCET criteria, using the distal internal carotid diameter as the denominator. CONTRAST:  65mL ISOVUE-370 IOPAMIDOL (ISOVUE-370) INJECTION 76% COMPARISON:  Brain MRI 05/22/2018. PET-CT 04/01/2018. Chest CTA 05/08/2018. FINDINGS: CT HEAD Brain: Small area of hypodensity in the left anterior corona radiata and subcortical white matter corresponding to the MRI lesion with no surrounding vasogenic edema and no mass effect. Small chronic cerebellar infarcts  were better demonstrated by MRI. Otherwise normal for age gray-white matter differentiation throughout the brain. No midline shift, ventriculomegaly, mass effect, evidence of mass lesion, intracranial hemorrhage or evidence of cortically based acute infarction. Calvarium and skull base: Within normal limits. Paranasal sinuses: Paranasal sinuses and mastoids are stable and well pneumatized. Orbits: Visualized orbits and scalp soft tissues are within normal limits. CTA NECK Skeleton: Osteopenia. No acute or suspicious osseous lesion identified. Upper chest: Partially visible abnormal right lung appears not significantly changed since the 05/08/2018 CTA. Underlying centrilobular emphysema. Small superior  mediastinal lymph nodes are stable. Other neck: Right IJ approach porta cath. Otherwise negative. No neck mass or lymphadenopathy. Aortic arch: Calcified aortic atherosclerosis. 3 vessel arch configuration. Right carotid system: No brachiocephalic artery or right CCA origin stenosis despite calcified plaque. Mild soft and calcified plaque at the right carotid bifurcation and proximal right ICA without stenosis. Left carotid system: No left CCA origin stenosis despite soft and calcified plaque. Mostly calcified atherosclerosis at the left ICA origin and bulb without stenosis. Vertebral arteries: No proximal right subclavian artery stenosis despite atherosclerosis. Soft and calcified plaque at the right vertebral artery origin resulting in moderate to severe stenosis (series 12, image 151). Right vertebral artery V2 segment tortuosity with no other plaque or stenosis to the skull base. Up to 50% stenosis of the proximal left subclavian artery due to plaque. Calcified plaque at the left vertebral artery origin resulting in mild to moderate stenosis on series 12, image 147. Codominant vertebral arteries. No other left vertebral artery plaque or stenosis to the skull base. CTA HEAD Posterior circulation: Codominant distal vertebral arteries are patent without plaque or stenosis. Normal left PICA origin. Dominant appearing right AICA. Patent vertebrobasilar junction and basilar artery without stenosis. Normal SCA and PCA origins. Posterior communicating arteries are diminutive or absent. Bilateral PCA branches are within normal limits. Anterior circulation: Both ICA siphons are patent. Mild calcified plaque on the left. Minimal to mild left supraclinoid ICA stenosis at the anterior genu. Similar calcified plaque on the right with mild right cavernous segment stenosis. Normal ophthalmic artery origins. Normal carotid termini, MCA and ACA origins. Mildly tortuous A1 segments. Anterior communicating artery and bilateral ACA  branches are within normal limits. Right MCA M1 segment, bifurcation, and right MCA branches are within normal limits. Left MCA M1 segment and bifurcation are patent without stenosis. Left MCA branches are within normal limits. Venous sinuses: Patent. Anatomic variants: None. Delayed phase: No abnormal enhancement identified (series 16, image 17). Review of the MIP images confirms the above findings IMPRESSION: 1. Negative for large vessel occlusion. 2. Bilateral Carotid atherosclerosis with no hemodynamically significant stenosis; mild bilateral ICA siphon stenosis. - otherwise negative Anterior circulation. 3. Moderate to severe right and mild-to-moderate left Vertebral Artery origin atherosclerotic stenosis. - otherwise negative Posterior circulation. 4. Stable hypodensity in the left frontal lobe white matter on CT corresponding to the recent MRI lesion. No associated edema, mass effect, or enhancement. - no new acute intracranial abnormality. 5. Partially visible abnormal right lung appears stable since the chest CTA on 05/08/2018. Electronically Signed   By: Genevie Ann M.D.   On: 05/24/2018 13:17   Dg Chest 2 View  Result Date: 05/24/2018 CLINICAL DATA:  Syncopal episodes.  History of lung cancer. EXAM: CHEST - 2 VIEW COMPARISON:  Chest CT 05/08/2018.  Radiographs 10/12/2017. FINDINGS: Right IJ Port-A-Cath tip extends to the superior cavoatrial junction. The heart size and mediastinal contours are stable. There has been partial clearing of the  right upper lobe and superior segment lower lobe consolidation compared with the CT of 2 weeks ago. The left lung is clear. There is no pneumothorax or significant pleural effusion. The bones appear unchanged. IMPRESSION: Partial clearing of right lung airspace disease compared with CT of 2 weeks ago, most consistent with improving pneumonia. Some of this could be related to previous radiation therapy. Continued follow-up recommended to document complete clearing.  Electronically Signed   By: Richardean Sale M.D.   On: 05/24/2018 12:56   Ct Angio Neck W Or Wo Contrast  Result Date: 05/24/2018 CLINICAL DATA:  63 year old female with nonenhancing left frontal white matter lesion on recent MRI favored to be subacute infarct. Recent dizziness, near syncope, weakness. Right lung cancer. EXAM: CT ANGIOGRAPHY HEAD AND NECK TECHNIQUE: Multidetector CT imaging of the head and neck was performed using the standard protocol during bolus administration of intravenous contrast. Multiplanar CT image reconstructions and MIPs were obtained to evaluate the vascular anatomy. Carotid stenosis measurements (when applicable) are obtained utilizing NASCET criteria, using the distal internal carotid diameter as the denominator. CONTRAST:  67mL ISOVUE-370 IOPAMIDOL (ISOVUE-370) INJECTION 76% COMPARISON:  Brain MRI 05/22/2018. PET-CT 04/01/2018. Chest CTA 05/08/2018. FINDINGS: CT HEAD Brain: Small area of hypodensity in the left anterior corona radiata and subcortical white matter corresponding to the MRI lesion with no surrounding vasogenic edema and no mass effect. Small chronic cerebellar infarcts were better demonstrated by MRI. Otherwise normal for age gray-white matter differentiation throughout the brain. No midline shift, ventriculomegaly, mass effect, evidence of mass lesion, intracranial hemorrhage or evidence of cortically based acute infarction. Calvarium and skull base: Within normal limits. Paranasal sinuses: Paranasal sinuses and mastoids are stable and well pneumatized. Orbits: Visualized orbits and scalp soft tissues are within normal limits. CTA NECK Skeleton: Osteopenia. No acute or suspicious osseous lesion identified. Upper chest: Partially visible abnormal right lung appears not significantly changed since the 05/08/2018 CTA. Underlying centrilobular emphysema. Small superior mediastinal lymph nodes are stable. Other neck: Right IJ approach porta cath. Otherwise negative. No  neck mass or lymphadenopathy. Aortic arch: Calcified aortic atherosclerosis. 3 vessel arch configuration. Right carotid system: No brachiocephalic artery or right CCA origin stenosis despite calcified plaque. Mild soft and calcified plaque at the right carotid bifurcation and proximal right ICA without stenosis. Left carotid system: No left CCA origin stenosis despite soft and calcified plaque. Mostly calcified atherosclerosis at the left ICA origin and bulb without stenosis. Vertebral arteries: No proximal right subclavian artery stenosis despite atherosclerosis. Soft and calcified plaque at the right vertebral artery origin resulting in moderate to severe stenosis (series 12, image 151). Right vertebral artery V2 segment tortuosity with no other plaque or stenosis to the skull base. Up to 50% stenosis of the proximal left subclavian artery due to plaque. Calcified plaque at the left vertebral artery origin resulting in mild to moderate stenosis on series 12, image 147. Codominant vertebral arteries. No other left vertebral artery plaque or stenosis to the skull base. CTA HEAD Posterior circulation: Codominant distal vertebral arteries are patent without plaque or stenosis. Normal left PICA origin. Dominant appearing right AICA. Patent vertebrobasilar junction and basilar artery without stenosis. Normal SCA and PCA origins. Posterior communicating arteries are diminutive or absent. Bilateral PCA branches are within normal limits. Anterior circulation: Both ICA siphons are patent. Mild calcified plaque on the left. Minimal to mild left supraclinoid ICA stenosis at the anterior genu. Similar calcified plaque on the right with mild right cavernous segment stenosis. Normal ophthalmic artery origins.  Normal carotid termini, MCA and ACA origins. Mildly tortuous A1 segments. Anterior communicating artery and bilateral ACA branches are within normal limits. Right MCA M1 segment, bifurcation, and right MCA branches are  within normal limits. Left MCA M1 segment and bifurcation are patent without stenosis. Left MCA branches are within normal limits. Venous sinuses: Patent. Anatomic variants: None. Delayed phase: No abnormal enhancement identified (series 16, image 17). Review of the MIP images confirms the above findings IMPRESSION: 1. Negative for large vessel occlusion. 2. Bilateral Carotid atherosclerosis with no hemodynamically significant stenosis; mild bilateral ICA siphon stenosis. - otherwise negative Anterior circulation. 3. Moderate to severe right and mild-to-moderate left Vertebral Artery origin atherosclerotic stenosis. - otherwise negative Posterior circulation. 4. Stable hypodensity in the left frontal lobe white matter on CT corresponding to the recent MRI lesion. No associated edema, mass effect, or enhancement. - no new acute intracranial abnormality. 5. Partially visible abnormal right lung appears stable since the chest CTA on 05/08/2018. Electronically Signed   By: Genevie Ann M.D.   On: 05/24/2018 13:17    Assessment: 62 y.o. female with history of stage III lung cancer and also stage I right upper lobe status post radiation February 2019 presenting with episodes of syncope/near syncope likely orthostatic and dizziness. Recent MRI brain done on 05/22/18 showed left posterior frontal subcortical white matter lesion concerning for subacute infarcts, mets, abscess or glioma. She also has small chronic infarcts in the bilateral cerebellar hemispheres. Follow up CTA head and neck negative for large vessel occlusion. Noted moderate to severe right and mild-to-moderate left Vertebral Artery origin atherosclerotic stenosis. She has history of occlusive DVT involving the popliteal and both left posterior tibial and peroneal veins started on Xarelto. Echocardiogram currently pending   Plan: 1. L frontal cystic lesion needs needs repeat imaging to be repeated in about 3 months that would include MRI brain with and  without contrast 2. con't xarelto as out pt.  3. Keppra 500 BID for 3 month until repeat imaging as potential focus for seizure 4. D/c planning 5. Out pt follow up 05/25/2018, 12:16 PM

## 2018-05-25 NOTE — Discharge Summary (Addendum)
[facility] NAME: Tykeria Wawrzyniak    MR#:  [id number]  DATE OF BIRTH:  16-Feb-1956  DATE OF ADMISSION:  05/24/2018 ADMITTING PHYSICIAN: Demetrios Loll, MD  DATE OF DISCHARGE: 05/25/2018  PRIMARY CARE PHYSICIAN: Maryland Pink, MD    ADMISSION DIAGNOSIS:  Syncopal episodes  DISCHARGE DIAGNOSIS:  Active Problems:   CVA (cerebral vascular accident) [facility])   Syncope   SECONDARY DIAGNOSIS:   Past Medical History:  Diagnosis Date  . Cancer of lower lobe of right lung ([place]) 05/07/2015  . COPD (chronic obstructive pulmonary disease) ([place])   . Non-small cell lung cancer ([place])   . 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  1.  Acute syncopal episode: Echocardiogram shows normal ejection fraction with no wall motion abnormalities.  CTA of the head and neck were essentially unremarkable.  MRI was performed on August 21 which shows subacute CVA.  Is not the etiology of her syncopal episode.  She was evaluated by neurology who is recommending follow-up in 3 months for repeat MRI.  Telemetry showed no acute pathology. She needs to keep hydrated and when turning side to side she needs to be careful. Keppra 500 BID for 3 month until repeat imaging as potential focus for seizure   2.  Bilateral cerebellar subacute infarcts: Patient will continue Xarelto and start statin.  She needs follow-up MRI brain in 3 months.  Outpatient follow-up was scheduled prior to discharge.  Patient was evaluated by neurology while in the hospital.  Echocardiogram shows no source of thrombi.  CTA of the head and neck as above was essentially unremarkable without significant stenosis.  3.  Lung cancer: Patient will follow-up with Dr. Rogue Bussing  4.  COPD which is stable  DISCHARGE CONDITIONS AND DIET:  Stable for discharge on regular diet  CONSULTS  OBTAINED:  Treatment Team:  Leotis Pain, MD  DRUG ALLERGIES:   Allergies  Allergen Reactions  . Lyrica [Pregabalin] Other (See Comments)    Made patient feel very dizzy and not feel good.     DISCHARGE MEDICATIONS:   Allergies as of 05/25/2018      Reactions   Lyrica [pregabalin] Other (See Comments)   Made patient feel very dizzy and not feel good.       Medication List    TAKE these medications   ADVAIR DISKUS 250-50 MCG/DOSE Aepb Generic drug:  Fluticasone-Salmeterol INHALE 1 INHALATION INTO THE LUNGS EVERY 12 (TWELVE) HOURS.   albuterol 108 (90 Base) MCG/ACT inhaler Commonly known as:  PROVENTIL HFA;VENTOLIN HFA Inhale 1 puff into the lungs every 6 (six) hours as needed for wheezing or shortness of breath.   dexamethasone 4 MG tablet Commonly known as:  DECADRON Take 1 tablet (4 mg total) by mouth 3 (three) times daily.   fluticasone 50 MCG/ACT nasal spray Commonly known as:  FLONASE SPRAY 2 SPRAYS INTO EACH NOSTRIL EVERY DAY   ipratropium-albuterol 0.5-2.5 (3) MG/3ML Soln Commonly known as:  DUONEB TAKE 3 MLS BY NEBULIZATION EVERY 4 (FOUR) HOURS AS NEEDED.   levETIRAcetam 500 MG tablet Commonly known as:  KEPPRA Take 1 tablet (500 mg total) by mouth 2 (two) times daily.   Rivaroxaban 15 & 20 MG Tbpk Take as directed on package   rosuvastatin 20 MG tablet Commonly known as:  CRESTOR Take 1 tablet (20 mg total) by mouth at bedtime.   vitamin  B-12 1000 MCG tablet Commonly known as:  CYANOCOBALAMIN Take 1,000 mcg by mouth daily.   Vitamin D3 1000 units Caps Take by mouth 2 (two) times daily.         Today   CHIEF COMPLAINT:  No acute events overnight.  Patient is anxious to go home.   VITAL SIGNS:  Blood pressure 123/71, pulse 75, temperature 98.3 F (36.8 C), temperature source Oral, resp. rate 16, height 5\' 1"  (1.549 m), weight 47.3 kg, SpO2 98 %.   REVIEW OF SYSTEMS:  Review of Systems  Constitutional: Negative.  Negative for  chills, fever and malaise/fatigue.  HENT: Negative.  Negative for ear discharge, ear pain, hearing loss, nosebleeds and sore throat.   Eyes: Negative.  Negative for blurred vision and pain.  Respiratory: Negative.  Negative for cough, hemoptysis, shortness of breath and wheezing.   Cardiovascular: Negative.  Negative for chest pain, palpitations and leg swelling.  Gastrointestinal: Negative.  Negative for abdominal pain, blood in stool, diarrhea, nausea and vomiting.  Genitourinary: Negative.  Negative for dysuria.  Musculoskeletal: Negative.  Negative for back pain.  Skin: Negative.   Neurological: Negative for dizziness, tremors, speech change, focal weakness, seizures and headaches.  Endo/Heme/Allergies: Negative.  Does not bruise/bleed easily.  Psychiatric/Behavioral: Negative.  Negative for depression, hallucinations and suicidal ideas.     PHYSICAL EXAMINATION:  GENERAL:  62 y.o.-year-old patient lying in the bed with no acute distress.  NECK:  Supple, no jugular venous distention. No thyroid enlargement, no tenderness.  LUNGS: Normal breath sounds bilaterally, no wheezing, rales,rhonchi  No use of accessory muscles of respiration.  CARDIOVASCULAR: S1, S2 normal. No murmurs, rubs, or gallops.  ABDOMEN: Soft, non-tender, non-distended. Bowel sounds present. No organomegaly or mass.  EXTREMITIES: No pedal edema, cyanosis, or clubbing.  PSYCHIATRIC: The patient is alert and oriented x 3.  SKIN: No obvious rash, lesion, or ulcer.   DATA REVIEW:   CBC Recent Labs  Lab 05/24/18 0812  WBC 11.0  HGB 10.5*  HCT 29.8*  PLT 629*    Chemistries  Recent Labs  Lab 05/21/18 1341  NA 132*  K 3.8  CL 98  CO2 24  GLUCOSE 111*  BUN 10  CREATININE 0.56  CALCIUM 8.6*  AST 19  ALT 12  ALKPHOS 175*  BILITOT 0.3    Cardiac Enzymes No results for input(s): TROPONINI in the last 168 hours.  Microbiology Results  @[email or web address] @  RADIOLOGY:  Ct Angio Head W Or Wo  Contrast  Result Date: 05/24/2018 CLINICAL DATA:  62 year old female with nonenhancing left frontal white matter lesion on recent MRI favored to be subacute infarct. Recent dizziness, near syncope, weakness. Right lung cancer. EXAM: CT ANGIOGRAPHY HEAD AND NECK TECHNIQUE: Multidetector CT imaging of the head and neck was performed using the standard protocol during bolus administration of intravenous contrast. Multiplanar CT image reconstructions and MIPs were obtained to evaluate the vascular anatomy. Carotid stenosis measurements (when applicable) are obtained utilizing NASCET criteria, using the distal internal carotid diameter as the denominator. CONTRAST:  32mL ISOVUE-370 IOPAMIDOL (ISOVUE-370) INJECTION 76% COMPARISON:  Brain MRI 05/22/2018. PET-CT 04/01/2018. Chest CTA 05/08/2018. FINDINGS: CT HEAD Brain: Small area of hypodensity in the left anterior corona radiata and subcortical white matter corresponding to the MRI lesion with no surrounding vasogenic edema and no mass effect. Small chronic cerebellar infarcts were better demonstrated by MRI. Otherwise normal for age gray-white matter differentiation throughout the brain. No midline shift, ventriculomegaly, mass effect, evidence of mass lesion, intracranial hemorrhage or evidence  of cortically based acute infarction. Calvarium and skull base: Within normal limits. Paranasal sinuses: Paranasal sinuses and mastoids are stable and well pneumatized. Orbits: Visualized orbits and scalp soft tissues are within normal limits. CTA NECK Skeleton: Osteopenia. No acute or suspicious osseous lesion identified. Upper chest: Partially visible abnormal right lung appears not significantly changed since the 05/08/2018 CTA. Underlying centrilobular emphysema. Small superior mediastinal lymph nodes are stable. Other neck: Right IJ approach porta cath. Otherwise negative. No neck mass or lymphadenopathy. Aortic arch: Calcified aortic atherosclerosis. 3 vessel arch  configuration. Right carotid system: No brachiocephalic artery or right CCA origin stenosis despite calcified plaque. Mild soft and calcified plaque at the right carotid bifurcation and proximal right ICA without stenosis. Left carotid system: No left CCA origin stenosis despite soft and calcified plaque. Mostly calcified atherosclerosis at the left ICA origin and bulb without stenosis. Vertebral arteries: No proximal right subclavian artery stenosis despite atherosclerosis. Soft and calcified plaque at the right vertebral artery origin resulting in moderate to severe stenosis (series 12, image 151). Right vertebral artery V2 segment tortuosity with no other plaque or stenosis to the skull base. Up to 50% stenosis of the proximal left subclavian artery due to plaque. Calcified plaque at the left vertebral artery origin resulting in mild to moderate stenosis on series 12, image 147. Codominant vertebral arteries. No other left vertebral artery plaque or stenosis to the skull base. CTA HEAD Posterior circulation: Codominant distal vertebral arteries are patent without plaque or stenosis. Normal left PICA origin. Dominant appearing right AICA. Patent vertebrobasilar junction and basilar artery without stenosis. Normal SCA and PCA origins. Posterior communicating arteries are diminutive or absent. Bilateral PCA branches are within normal limits. Anterior circulation: Both ICA siphons are patent. Mild calcified plaque on the left. Minimal to mild left supraclinoid ICA stenosis at the anterior genu. Similar calcified plaque on the right with mild right cavernous segment stenosis. Normal ophthalmic artery origins. Normal carotid termini, MCA and ACA origins. Mildly tortuous A1 segments. Anterior communicating artery and bilateral ACA branches are within normal limits. Right MCA M1 segment, bifurcation, and right MCA branches are within normal limits. Left MCA M1 segment and bifurcation are patent without stenosis. Left MCA  branches are within normal limits. Venous sinuses: Patent. Anatomic variants: None. Delayed phase: No abnormal enhancement identified (series 16, image 17). Review of the MIP images confirms the above findings IMPRESSION: 1. Negative for large vessel occlusion. 2. Bilateral Carotid atherosclerosis with no hemodynamically significant stenosis; mild bilateral ICA siphon stenosis. - otherwise negative Anterior circulation. 3. Moderate to severe right and mild-to-moderate left Vertebral Artery origin atherosclerotic stenosis. - otherwise negative Posterior circulation. 4. Stable hypodensity in the left frontal lobe white matter on CT corresponding to the recent MRI lesion. No associated edema, mass effect, or enhancement. - no new acute intracranial abnormality. 5. Partially visible abnormal right lung appears stable since the chest CTA on 05/08/2018. Electronically Signed   By: Genevie Ann M.D.   On: 05/24/2018 13:17   Dg Chest 2 View  Result Date: 05/24/2018 CLINICAL DATA:  Syncopal episodes.  History of lung cancer. EXAM: CHEST - 2 VIEW COMPARISON:  Chest CT 05/08/2018.  Radiographs 10/12/2017. FINDINGS: Right IJ Port-A-Cath tip extends to the superior cavoatrial junction. The heart size and mediastinal contours are stable. There has been partial clearing of the right upper lobe and superior segment lower lobe consolidation compared with the CT of 2 weeks ago. The left lung is clear. There is no pneumothorax or significant pleural  effusion. The bones appear unchanged. IMPRESSION: Partial clearing of right lung airspace disease compared with CT of 2 weeks ago, most consistent with improving pneumonia. Some of this could be related to previous radiation therapy. Continued follow-up recommended to document complete clearing. Electronically Signed   By: Richardean Sale M.D.   On: 05/24/2018 12:56   Ct Angio Neck W Or Wo Contrast  Result Date: 05/24/2018 CLINICAL DATA:  62 year old female with nonenhancing left  frontal white matter lesion on recent MRI favored to be subacute infarct. Recent dizziness, near syncope, weakness. Right lung cancer. EXAM: CT ANGIOGRAPHY HEAD AND NECK TECHNIQUE: Multidetector CT imaging of the head and neck was performed using the standard protocol during bolus administration of intravenous contrast. Multiplanar CT image reconstructions and MIPs were obtained to evaluate the vascular anatomy. Carotid stenosis measurements (when applicable) are obtained utilizing NASCET criteria, using the distal internal carotid diameter as the denominator. CONTRAST:  2mL ISOVUE-370 IOPAMIDOL (ISOVUE-370) INJECTION 76% COMPARISON:  Brain MRI 05/22/2018. PET-CT 04/01/2018. Chest CTA 05/08/2018. FINDINGS: CT HEAD Brain: Small area of hypodensity in the left anterior corona radiata and subcortical white matter corresponding to the MRI lesion with no surrounding vasogenic edema and no mass effect. Small chronic cerebellar infarcts were better demonstrated by MRI. Otherwise normal for age gray-white matter differentiation throughout the brain. No midline shift, ventriculomegaly, mass effect, evidence of mass lesion, intracranial hemorrhage or evidence of cortically based acute infarction. Calvarium and skull base: Within normal limits. Paranasal sinuses: Paranasal sinuses and mastoids are stable and well pneumatized. Orbits: Visualized orbits and scalp soft tissues are within normal limits. CTA NECK Skeleton: Osteopenia. No acute or suspicious osseous lesion identified. Upper chest: Partially visible abnormal right lung appears not significantly changed since the 05/08/2018 CTA. Underlying centrilobular emphysema. Small superior mediastinal lymph nodes are stable. Other neck: Right IJ approach porta cath. Otherwise negative. No neck mass or lymphadenopathy. Aortic arch: Calcified aortic atherosclerosis. 3 vessel arch configuration. Right carotid system: No brachiocephalic artery or right CCA origin stenosis despite  calcified plaque. Mild soft and calcified plaque at the right carotid bifurcation and proximal right ICA without stenosis. Left carotid system: No left CCA origin stenosis despite soft and calcified plaque. Mostly calcified atherosclerosis at the left ICA origin and bulb without stenosis. Vertebral arteries: No proximal right subclavian artery stenosis despite atherosclerosis. Soft and calcified plaque at the right vertebral artery origin resulting in moderate to severe stenosis (series 12, image 151). Right vertebral artery V2 segment tortuosity with no other plaque or stenosis to the skull base. Up to 50% stenosis of the proximal left subclavian artery due to plaque. Calcified plaque at the left vertebral artery origin resulting in mild to moderate stenosis on series 12, image 147. Codominant vertebral arteries. No other left vertebral artery plaque or stenosis to the skull base. CTA HEAD Posterior circulation: Codominant distal vertebral arteries are patent without plaque or stenosis. Normal left PICA origin. Dominant appearing right AICA. Patent vertebrobasilar junction and basilar artery without stenosis. Normal SCA and PCA origins. Posterior communicating arteries are diminutive or absent. Bilateral PCA branches are within normal limits. Anterior circulation: Both ICA siphons are patent. Mild calcified plaque on the left. Minimal to mild left supraclinoid ICA stenosis at the anterior genu. Similar calcified plaque on the right with mild right cavernous segment stenosis. Normal ophthalmic artery origins. Normal carotid termini, MCA and ACA origins. Mildly tortuous A1 segments. Anterior communicating artery and bilateral ACA branches are within normal limits. Right MCA M1 segment, bifurcation, and right  MCA branches are within normal limits. Left MCA M1 segment and bifurcation are patent without stenosis. Left MCA branches are within normal limits. Venous sinuses: Patent. Anatomic variants: None. Delayed phase:  No abnormal enhancement identified (series 16, image 17). Review of the MIP images confirms the above findings IMPRESSION: 1. Negative for large vessel occlusion. 2. Bilateral Carotid atherosclerosis with no hemodynamically significant stenosis; mild bilateral ICA siphon stenosis. - otherwise negative Anterior circulation. 3. Moderate to severe right and mild-to-moderate left Vertebral Artery origin atherosclerotic stenosis. - otherwise negative Posterior circulation. 4. Stable hypodensity in the left frontal lobe white matter on CT corresponding to the recent MRI lesion. No associated edema, mass effect, or enhancement. - no new acute intracranial abnormality. 5. Partially visible abnormal right lung appears stable since the chest CTA on 05/08/2018. Electronically Signed   By: Genevie Ann M.D.   On: 05/24/2018 13:17      Allergies as of 05/25/2018      Reactions   Lyrica [pregabalin] Other (See Comments)   Made patient feel very dizzy and not feel good.       Medication List    TAKE these medications   ADVAIR DISKUS 250-50 MCG/DOSE Aepb Generic drug:  Fluticasone-Salmeterol INHALE 1 INHALATION INTO THE LUNGS EVERY 12 (TWELVE) HOURS.   albuterol 108 (90 Base) MCG/ACT inhaler Commonly known as:  PROVENTIL HFA;VENTOLIN HFA Inhale 1 puff into the lungs every 6 (six) hours as needed for wheezing or shortness of breath.   dexamethasone 4 MG tablet Commonly known as:  DECADRON Take 1 tablet (4 mg total) by mouth 3 (three) times daily.   fluticasone 50 MCG/ACT nasal spray Commonly known as:  FLONASE SPRAY 2 SPRAYS INTO EACH NOSTRIL EVERY DAY   ipratropium-albuterol 0.5-2.5 (3) MG/3ML Soln Commonly known as:  DUONEB TAKE 3 MLS BY NEBULIZATION EVERY 4 (FOUR) HOURS AS NEEDED.   levETIRAcetam 500 MG tablet Commonly known as:  KEPPRA Take 1 tablet (500 mg total) by mouth 2 (two) times daily.   Rivaroxaban 15 & 20 MG Tbpk Take as directed on package   rosuvastatin 20 MG tablet Commonly known  as:  CRESTOR Take 1 tablet (20 mg total) by mouth at bedtime.   vitamin B-12 1000 MCG tablet Commonly known as:  CYANOCOBALAMIN Take 1,000 mcg by mouth daily.   Vitamin D3 1000 units Caps Take by mouth 2 (two) times daily.          Management plans discussed with the patient and she is in agreement. Stable for discharge home  Patient should follow up with pcp  CODE STATUS:     Code Status Orders  (From admission, onward)         Start     Ordered   05/24/18 1141  Full code  Continuous     05/24/18 1141        Code Status History    Date Active Date Inactive Code Status Order ID Comments User Context   08/18/2015 2014 08/23/2015 2219 Full Code [id number]  Nestor Lewandowsky, MD Inpatient    Advance Directive Documentation     Most Recent Value  Type of Advance Directive  Living will, Healthcare Power of Attorney  Pre-existing out of facility DNR order (yellow form or pink MOST form)  -  "MOST" Form in Place?  -      TOTAL TIME TAKING CARE OF THIS PATIENT: 38 minutes.    Note: This dictation was prepared with Dragon dictation along with smaller phrase technology. Any transcriptional  errors that result from this process are unintentional.  Myldred Raju M.D on 05/25/2018 at 12:53 PM  Between 7am to 6pm - Pager - [phone] After 6pm go to [email or web address] - password [email or web address] [facility]  Office  [phone]  CC: Primary care physician; Maryland Pink, MD

## 2018-05-25 NOTE — Progress Notes (Signed)
Patient given all discharge instructions and printed prescriptions. All questions answered. Patient states that she was cleared by both MDs to drive home. PIV removed, and patient dressed herself. She was walked to her car by [employer].

## 2018-05-25 NOTE — Evaluation (Signed)
SLP Cancellation Note  Patient Details Name: Claudia Chavez MRN: [id number] DOB: [birth year]   Cancelled treatment:       Reason Eval/Treat Not Completed: SLP screened, no needs identified, will sign off  SLp educated pt to notify nsg and slp if changes in status occur. Pt stated no difficulties with swallowing or speech/ language.   [place] Sauber 05/25/2018, 9:31 AM

## 2018-05-28 DIAGNOSIS — R55 Syncope and collapse: Secondary | ICD-10-CM | POA: Diagnosis not present

## 2018-05-28 LAB — HIV ANTIBODY (ROUTINE TESTING W REFLEX): HIV Screen 4th Generation wRfx: NONREACTIVE

## 2018-06-04 DIAGNOSIS — D649 Anemia, unspecified: Secondary | ICD-10-CM | POA: Diagnosis not present

## 2018-06-07 ENCOUNTER — Other Ambulatory Visit: Payer: Self-pay

## 2018-06-07 ENCOUNTER — Telehealth: Payer: Self-pay | Admitting: Internal Medicine

## 2018-06-07 ENCOUNTER — Inpatient Hospital Stay
Admission: AD | Admit: 2018-06-07 | Discharge: 2018-06-09 | DRG: 378 | Disposition: A | Discharge status: Home / Self Care | Payer: PPO | Source: Ambulatory Visit | Attending: Internal Medicine | Admitting: Internal Medicine

## 2018-06-07 ENCOUNTER — Other Ambulatory Visit: Payer: Self-pay | Admitting: Oncology

## 2018-06-07 ENCOUNTER — Ambulatory Visit: Payer: PPO

## 2018-06-07 ENCOUNTER — Telehealth: Payer: Self-pay | Admitting: *Deleted

## 2018-06-07 ENCOUNTER — Inpatient Hospital Stay: Payer: PPO | Attending: Nurse Practitioner | Admitting: Nurse Practitioner

## 2018-06-07 ENCOUNTER — Encounter: Payer: Self-pay | Admitting: Nurse Practitioner

## 2018-06-07 VITALS — BP 120/71 | HR 110 | Temp 99.6°F | Resp 22

## 2018-06-07 DIAGNOSIS — Z7901 Long term (current) use of anticoagulants: Secondary | ICD-10-CM

## 2018-06-07 DIAGNOSIS — K640 First degree hemorrhoids: Secondary | ICD-10-CM | POA: Diagnosis not present

## 2018-06-07 DIAGNOSIS — K3189 Other diseases of stomach and duodenum: Secondary | ICD-10-CM | POA: Diagnosis present

## 2018-06-07 DIAGNOSIS — K5730 Diverticulosis of large intestine without perforation or abscess without bleeding: Secondary | ICD-10-CM | POA: Diagnosis not present

## 2018-06-07 DIAGNOSIS — C3431 Malignant neoplasm of lower lobe, right bronchus or lung: Secondary | ICD-10-CM

## 2018-06-07 DIAGNOSIS — Z23 Encounter for immunization: Secondary | ICD-10-CM

## 2018-06-07 DIAGNOSIS — K5900 Constipation, unspecified: Secondary | ICD-10-CM | POA: Diagnosis not present

## 2018-06-07 DIAGNOSIS — K208 Other esophagitis: Secondary | ICD-10-CM | POA: Diagnosis present

## 2018-06-07 DIAGNOSIS — J449 Chronic obstructive pulmonary disease, unspecified: Secondary | ICD-10-CM | POA: Diagnosis not present

## 2018-06-07 DIAGNOSIS — K209 Esophagitis, unspecified: Secondary | ICD-10-CM | POA: Diagnosis not present

## 2018-06-07 DIAGNOSIS — Z86718 Personal history of other venous thrombosis and embolism: Secondary | ICD-10-CM | POA: Diagnosis not present

## 2018-06-07 DIAGNOSIS — K449 Diaphragmatic hernia without obstruction or gangrene: Secondary | ICD-10-CM | POA: Diagnosis not present

## 2018-06-07 DIAGNOSIS — Z95828 Presence of other vascular implants and grafts: Secondary | ICD-10-CM | POA: Diagnosis not present

## 2018-06-07 DIAGNOSIS — D649 Anemia, unspecified: Secondary | ICD-10-CM | POA: Diagnosis not present

## 2018-06-07 DIAGNOSIS — K2970 Gastritis, unspecified, without bleeding: Secondary | ICD-10-CM | POA: Diagnosis not present

## 2018-06-07 DIAGNOSIS — Z7951 Long term (current) use of inhaled steroids: Secondary | ICD-10-CM

## 2018-06-07 DIAGNOSIS — F17210 Nicotine dependence, cigarettes, uncomplicated: Secondary | ICD-10-CM | POA: Diagnosis present

## 2018-06-07 DIAGNOSIS — D123 Benign neoplasm of transverse colon: Secondary | ICD-10-CM | POA: Diagnosis not present

## 2018-06-07 DIAGNOSIS — D125 Benign neoplasm of sigmoid colon: Secondary | ICD-10-CM | POA: Diagnosis not present

## 2018-06-07 DIAGNOSIS — D122 Benign neoplasm of ascending colon: Secondary | ICD-10-CM | POA: Diagnosis not present

## 2018-06-07 DIAGNOSIS — Z8673 Personal history of transient ischemic attack (TIA), and cerebral infarction without residual deficits: Secondary | ICD-10-CM

## 2018-06-07 DIAGNOSIS — K922 Gastrointestinal hemorrhage, unspecified: Secondary | ICD-10-CM | POA: Diagnosis not present

## 2018-06-07 DIAGNOSIS — C3492 Malignant neoplasm of unspecified part of left bronchus or lung: Secondary | ICD-10-CM | POA: Insufficient documentation

## 2018-06-07 DIAGNOSIS — K921 Melena: Principal | ICD-10-CM | POA: Diagnosis present

## 2018-06-07 DIAGNOSIS — I82409 Acute embolism and thrombosis of unspecified deep veins of unspecified lower extremity: Secondary | ICD-10-CM | POA: Diagnosis not present

## 2018-06-07 DIAGNOSIS — Z720 Tobacco use: Secondary | ICD-10-CM | POA: Diagnosis not present

## 2018-06-07 DIAGNOSIS — K5790 Diverticulosis of intestine, part unspecified, without perforation or abscess without bleeding: Secondary | ICD-10-CM | POA: Diagnosis not present

## 2018-06-07 DIAGNOSIS — Z888 Allergy status to other drugs, medicaments and biological substances status: Secondary | ICD-10-CM | POA: Diagnosis not present

## 2018-06-07 DIAGNOSIS — R531 Weakness: Secondary | ICD-10-CM

## 2018-06-07 DIAGNOSIS — I4519 Other right bundle-branch block: Secondary | ICD-10-CM | POA: Diagnosis not present

## 2018-06-07 DIAGNOSIS — Z923 Personal history of irradiation: Secondary | ICD-10-CM

## 2018-06-07 DIAGNOSIS — G709 Myoneural disorder, unspecified: Secondary | ICD-10-CM | POA: Diagnosis present

## 2018-06-07 DIAGNOSIS — Z85118 Personal history of other malignant neoplasm of bronchus and lung: Secondary | ICD-10-CM

## 2018-06-07 DIAGNOSIS — E860 Dehydration: Secondary | ICD-10-CM

## 2018-06-07 DIAGNOSIS — C3490 Malignant neoplasm of unspecified part of unspecified bronchus or lung: Secondary | ICD-10-CM | POA: Diagnosis not present

## 2018-06-07 DIAGNOSIS — D62 Acute posthemorrhagic anemia: Secondary | ICD-10-CM | POA: Diagnosis present

## 2018-06-07 DIAGNOSIS — D126 Benign neoplasm of colon, unspecified: Secondary | ICD-10-CM | POA: Diagnosis not present

## 2018-06-07 DIAGNOSIS — I951 Orthostatic hypotension: Secondary | ICD-10-CM

## 2018-06-07 DIAGNOSIS — I639 Cerebral infarction, unspecified: Secondary | ICD-10-CM | POA: Diagnosis not present

## 2018-06-07 DIAGNOSIS — E876 Hypokalemia: Secondary | ICD-10-CM | POA: Insufficient documentation

## 2018-06-07 DIAGNOSIS — K635 Polyp of colon: Secondary | ICD-10-CM | POA: Diagnosis not present

## 2018-06-07 DIAGNOSIS — Z9221 Personal history of antineoplastic chemotherapy: Secondary | ICD-10-CM | POA: Diagnosis not present

## 2018-06-07 DIAGNOSIS — Z79899 Other long term (current) drug therapy: Secondary | ICD-10-CM

## 2018-06-07 LAB — CBC WITH DIFFERENTIAL/PLATELET
BASOS PCT: 1 %
Basophils Absolute: 0.1 10*3/uL (ref 0–0.1)
EOS ABS: 0.1 10*3/uL (ref 0–0.7)
EOS PCT: 2 %
HCT: 21.5 % — ABNORMAL LOW (ref 35.0–47.0)
HEMOGLOBIN: 7.3 g/dL — AB (ref 12.0–16.0)
Lymphocytes Relative: 9 %
Lymphs Abs: 0.9 10*3/uL — ABNORMAL LOW (ref 1.0–3.6)
MCH: 33.5 pg (ref 26.0–34.0)
MCHC: 33.9 g/dL (ref 32.0–36.0)
MCV: 98.9 fL (ref 80.0–100.0)
Monocytes Absolute: 0.6 10*3/uL (ref 0.2–0.9)
Monocytes Relative: 6 %
NEUTROS PCT: 82 %
Neutro Abs: 8.1 10*3/uL — ABNORMAL HIGH (ref 1.4–6.5)
PLATELETS: 406 10*3/uL (ref 150–440)
RBC: 2.17 MIL/uL — AB (ref 3.80–5.20)
RDW: 14.4 % (ref 11.5–14.5)
WBC: 9.8 10*3/uL (ref 3.6–11.0)

## 2018-06-07 LAB — COMPREHENSIVE METABOLIC PANEL
ALK PHOS: 133 U/L — AB (ref 38–126)
ALT: 15 U/L (ref 0–44)
AST: 20 U/L (ref 15–41)
Albumin: 2.4 g/dL — ABNORMAL LOW (ref 3.5–5.0)
Anion gap: 10 (ref 5–15)
BILIRUBIN TOTAL: 0.4 mg/dL (ref 0.3–1.2)
BUN: 11 mg/dL (ref 8–23)
CO2: 23 mmol/L (ref 22–32)
CREATININE: 0.61 mg/dL (ref 0.44–1.00)
Calcium: 8 mg/dL — ABNORMAL LOW (ref 8.9–10.3)
Chloride: 102 mmol/L (ref 98–111)
Glucose, Bld: 110 mg/dL — ABNORMAL HIGH (ref 70–99)
Potassium: 3.7 mmol/L (ref 3.5–5.1)
Sodium: 135 mmol/L (ref 135–145)
Total Protein: 6.2 g/dL — ABNORMAL LOW (ref 6.5–8.1)

## 2018-06-07 LAB — PREPARE RBC (CROSSMATCH)

## 2018-06-07 LAB — MAGNESIUM: Magnesium: 1.7 mg/dL (ref 1.7–2.4)

## 2018-06-07 LAB — LACTATE DEHYDROGENASE: LDH: 130 U/L (ref 98–192)

## 2018-06-07 LAB — HEPARIN LEVEL (UNFRACTIONATED): Heparin Unfractionated: 0.36 IU/mL (ref 0.30–0.70)

## 2018-06-07 LAB — APTT: aPTT: 28 seconds (ref 24–36)

## 2018-06-07 LAB — ABO/RH: ABO/RH(D): A POS

## 2018-06-07 LAB — OCCULT BLOOD X 1 CARD TO LAB, STOOL: FECAL OCCULT BLD: POSITIVE — AB

## 2018-06-07 MED ORDER — PNEUMOCOCCAL VAC POLYVALENT 25 MCG/0.5ML IJ INJ
0.5000 mL | INJECTION | INTRAMUSCULAR | Status: AC
  Administered 2018-06-08: 10:00:00 0.5 mL via INTRAMUSCULAR
  Filled 2018-06-07: qty 0.5

## 2018-06-07 MED ORDER — HEPARIN SOD (PORK) LOCK FLUSH 100 UNIT/ML IV SOLN: 500.0000 [IU] | Freq: Once | INTRAVENOUS | Status: DC

## 2018-06-07 MED ORDER — INFLUENZA VAC SPLIT QUAD 0.5 ML IM SUSY
0.5000 mL | PREFILLED_SYRINGE | INTRAMUSCULAR | Status: AC
  Administered 2018-06-08: 10:00:00 0.5 mL via INTRAMUSCULAR
  Filled 2018-06-07: qty 0.5

## 2018-06-07 MED ORDER — ACETAMINOPHEN 325 MG PO TABS
650.0000 mg | ORAL_TABLET | Freq: Four times a day (QID) | ORAL | Status: DC | PRN
  Filled 2018-06-07: qty 2

## 2018-06-07 MED ORDER — PANTOPRAZOLE SODIUM 40 MG IV SOLR
40.0000 mg | Freq: Two times a day (BID) | INTRAVENOUS | Status: DC
  Administered 2018-06-07 – 2018-06-09 (×4): 40 mg via INTRAVENOUS
  Filled 2018-06-07 (×4): qty 40

## 2018-06-07 MED ORDER — SODIUM CHLORIDE 0.9% IV SOLUTION
Freq: Once | INTRAVENOUS | Status: AC
  Administered 2018-06-08: 02:00:00 10 mL/h via INTRAVENOUS

## 2018-06-07 MED ORDER — ONDANSETRON HCL 4 MG/2ML IJ SOLN: 4.0000 mg | Freq: Four times a day (QID) | INTRAMUSCULAR | Status: DC | PRN

## 2018-06-07 MED ORDER — HEPARIN (PORCINE) IN NACL 100-0.45 UNIT/ML-% IJ SOLN
950.0000 [IU]/h | INTRAMUSCULAR | Status: DC
  Administered 2018-06-07: 750 [IU]/h via INTRAVENOUS
  Filled 2018-06-07: qty 250

## 2018-06-07 MED ORDER — SODIUM CHLORIDE 0.9% FLUSH
10.0000 mL | INTRAVENOUS | Status: DC | PRN
  Administered 2018-06-07: 10 mL via INTRAVENOUS
  Filled 2018-06-07: qty 10

## 2018-06-07 MED ORDER — SODIUM CHLORIDE 0.9 % IV SOLN
Freq: Once | INTRAVENOUS | Status: AC
  Administered 2018-06-07: 13:00:00 via INTRAVENOUS
  Filled 2018-06-07: qty 250

## 2018-06-07 MED ORDER — ONDANSETRON HCL 4 MG PO TABS: 4.0000 mg | ORAL_TABLET | Freq: Four times a day (QID) | ORAL | Status: DC | PRN

## 2018-06-07 MED ORDER — HEPARIN (PORCINE) IN NACL 100-0.45 UNIT/ML-% IJ SOLN: 750.0000 [IU]/h | INTRAMUSCULAR | Status: DC

## 2018-06-07 MED ORDER — ACETAMINOPHEN 650 MG RE SUPP: 650.0000 mg | Freq: Four times a day (QID) | RECTAL | Status: DC | PRN

## 2018-06-07 NOTE — Progress Notes (Signed)
ANTICOAGULATION CONSULT NOTE - Initial Consult  Pharmacy Consult for Heparin drip Indication: Patient on Xarelto- transition to Heparin drip  Allergies  Allergen Reactions  . Keppra [Levetiracetam] Other (See Comments)    Nausea and dizzy  . Lyrica [Pregabalin] Other (See Comments)    Made patient feel very dizzy and not feel good.     Patient Measurements: Height: 5\' 1"  (154.9 cm) Weight: 102 lb 11.2 oz (46.6 kg) IBW/kg (Calculated) : 47.8 Heparin Dosing Weight: 46.6 kg  Vital Signs: Temp: 99.2 F (37.3 C) (09/06 2055) Temp Source: Oral (09/06 2055) BP: 117/73 (09/06 2055) Pulse Rate: 108 (09/06 2055)  Labs: Recent Labs    06/07/18 1300 06/07/18 1951  HGB 7.3*  --   HCT 21.5*  --   PLT 406  --   APTT  --  28  HEPARINUNFRC  --  0.36  CREATININE 0.61  --     Estimated Creatinine Clearance: 53 mL/min (by C-G formula based on SCr of 0.61 mg/dL).   Medical History: Past Medical History:  Diagnosis Date  . Cancer of lower lobe of right lung ([place]) 05/07/2015  . COPD (chronic obstructive pulmonary disease) ([place])   . Non-small cell lung cancer ([place])   . Pneumonia 04/2015    Medications:  Scheduled:  . sodium chloride   Intravenous Once  . [START ON 06/08/2018] Influenza vac split quadrivalent PF  0.5 mL Intramuscular Tomorrow-1000  . pantoprazole (PROTONIX) IV  40 mg Intravenous Q12H  . [START ON 06/08/2018] pneumococcal 23 valent vaccine  0.5 mL Intramuscular Tomorrow-1000   Infusions:  . heparin      Assessment: 62 yo F to transition from Xarelto to Heparin drip. Patient with GI bleed, hx of lung cancer started on Xarelto a month ago for DVT and subacute CVA. Patient on Xarelto PTA with last dose on 06/06/18 in the evening around 2000. Hgb 7.3  Plt 406  HL 0.36  APTT 28   Goal of Therapy:  APTT 66-102 Heparin level 0.3-0.7 units/ml Monitor platelets by anticoagulation protocol: Yes   Plan:  No bolus per MD.  Will order Heparin 750 units/hr to start at 2000  tonight 06/07/18. (currently has not been started yet). Will check aPTTs per protocol and Heparin levels once a day until both correlate, then switch to just HLs. Will order aptt in 6 hours.  Thelma Viana A 06/07/2018,9:09 PM

## 2018-06-07 NOTE — H&P (Signed)
[facility] NAME: Claudia Chavez    MR#:  [id number]  DATE OF BIRTH:  1956/09/29  DATE OF ADMISSION:  06/07/2018  PRIMARY CARE PHYSICIAN: Maryland Pink, MD   REQUESTING/REFERRING PHYSICIAN: Dr. Rogue Bussing  CHIEF COMPLAINT:  abnormal hemoglobin and black tarry stools  HISTORY OF PRESENT ILLNESS:  Claudia Chavez  is a 62 y.o. female with a known history of lung cancer not on any treatment, history of DVT started on Xarelto one month ago, sub acute CVA about a month ago is a direct admit from cancer center with increasing weakness malaise and black tarry stools. Patient was found to be anemic with hemoglobin of 7.3 hemoglobin one month ago prior to starting Xarelto was 10.5.  pt is being admitted for further evaluation and management for G.I. bleed. She denies any bloating belching or any acid reflux symptoms. Denies chest pain or shortness of breath. Has not had EGD or colonoscopy in the past PAST MEDICAL HISTORY:   Past Medical History:  Diagnosis Date  . Cancer of lower lobe of right lung ([place]) 05/07/2015  . COPD (chronic obstructive pulmonary disease) ([place])   . Non-small cell lung cancer ([place])   . Pneumonia 04/2015    PAST SURGICAL HISTOIRY:   Past Surgical History:  Procedure Laterality Date  . ABDOMINAL HYSTERECTOMY    . ELBOW SURGERY Right 1995  . ELECTROMAGNETIC NAVIGATION BROCHOSCOPY N/A 10/25/2017   Procedure: ELECTROMAGNETIC NAVIGATION BRONCHOSCOPY;  Surgeon: [name], MD;  Location: [facility];  Service: Cardiopulmonary;  Laterality: N/A;  . PORTACATH PLACEMENT Right 05/10/2015   Procedure: INSERTION PORT-A-CATH;  Surgeon: Nestor Lewandowsky, MD;  Location: [facility];  Service: General;  Laterality: Right;  Marland Kitchen VIDEO ASSISTED THORACOSCOPY (VATS)/THOROCOTOMY Right 08/18/2015   Procedure: VIDEO ASSISTED THORACOSCOPY (VATS)/THOROCOTOMY;  Surgeon: Nestor Lewandowsky, MD;  Location: [facility];  Service: General;  Laterality:  Right;    SOCIAL HISTORY:   Social History   Tobacco Use  . Smoking status: Current Every Day Smoker    Packs/day: 0.50    Years: 40.00    Pack years: 20.00    Types: Cigarettes  . Smokeless tobacco: Never Used  . Tobacco comment: "1 pk or less per day"  Substance Use Topics  . Alcohol use: Yes    Alcohol/week: 2.0 - 4.0 standard drinks    Types: 2 - 4 Cans of beer per week    Comment: beer-occasionally    FAMILY HISTORY:   Family History  Problem Relation Age of Onset  . Stroke Mother   . Lung cancer Father 45    DRUG ALLERGIES:   Allergies  Allergen Reactions  . Keppra [Levetiracetam] Other (See Comments)    Nausea and dizzy  . Lyrica [Pregabalin] Other (See Comments)    Made patient feel very dizzy and not feel good.     REVIEW OF SYSTEMS:  Review of Systems  Constitutional: Positive for malaise/fatigue. Negative for chills, fever and weight loss.  HENT: Negative for ear discharge, ear pain and nosebleeds.   Eyes: Negative for blurred vision, pain and discharge.  Respiratory: Negative for sputum production, shortness of breath, wheezing and stridor.   Cardiovascular: Negative for chest pain, palpitations, orthopnea and PND.  Gastrointestinal: Positive for melena. Negative for abdominal pain, diarrhea, nausea and vomiting.  Genitourinary: Negative for frequency and urgency.  Musculoskeletal: Negative for back pain and joint pain.  Neurological: Positive for dizziness and weakness. Negative for sensory change, speech change and focal weakness.  Psychiatric/Behavioral: Negative for depression and hallucinations. The patient is not nervous/anxious.      MEDICATIONS AT HOME:   Prior to Admission medications   Medication Sig Start Date End Date Taking? Authorizing Provider  albuterol (PROVENTIL HFA;VENTOLIN HFA) 108 (90 Base) MCG/ACT inhaler Inhale 1 puff into the lungs every 6 (six) hours as needed for wheezing or shortness of breath.    [provider]  Cholecalciferol (VITAMIN D3) 1000 units CAPS Take by mouth 2 (two) times daily.    [provider]  fluticasone (FLONASE) 50 MCG/ACT nasal spray SPRAY 2 SPRAYS INTO EACH NOSTRIL EVERY DAY 01/29/18   [provider]  Fluticasone-Salmeterol (ADVAIR DISKUS) 250-50 MCG/DOSE AEPB INHALE 1 INHALATION INTO THE LUNGS EVERY 12 (TWELVE) HOURS. 09/05/17   [provider]  ipratropium-albuterol (DUONEB) 0.5-2.5 (3) MG/3ML SOLN TAKE 3 MLS BY NEBULIZATION EVERY 4 (FOUR) HOURS AS NEEDED. 03/28/18   Cammie Sickle, MD  levETIRAcetam (KEPPRA) 500 MG tablet Take 1 tablet (500 mg total) by mouth 2 (two) times daily. 05/25/18 09/22/18  Bettey Costa, MD  Rivaroxaban 15 & 20 MG TBPK Take as directed on package 05/08/18   Jacquelin Hawking, NP  rosuvastatin (CRESTOR) 20 MG tablet Take 1 tablet (20 mg total) by mouth at bedtime. 05/25/18 05/25/19  Bettey Costa, MD  vitamin B-12 (CYANOCOBALAMIN) 1000 MCG tablet Take 1,000 mcg by mouth daily.    [provider]      VITAL SIGNS:  Blood pressure 122/60, pulse (!) 106, temperature 99.5 F (37.5 C), temperature source Oral, resp. rate 20, SpO2 97 %.  PHYSICAL EXAMINATION:  GENERAL:  62 y.o.-year-old patient lying in the bed with no acute distress. Pallor+ EYES: Pupils equal, round, reactive to light and accommodation. No scleral icterus. Extraocular muscles intact.  HEENT: Head atraumatic, normocephalic. Oropharynx and nasopharynx clear.  NECK:  Supple, no jugular venous distention. No thyroid enlargement, no tenderness.  LUNGS: Normal breath sounds bilaterally, no wheezing, rales,rhonchi or crepitation. No use of accessory muscles of respiration.  CARDIOVASCULAR: S1, S2 normal. No murmurs, rubs, or gallops.  ABDOMEN: Soft, nontender, nondistended. Bowel sounds present. No organomegaly or mass.  EXTREMITIES: No pedal edema, cyanosis, or clubbing.  NEUROLOGIC: Cranial nerves II through XII are intact. Muscle strength 5/5 in all  extremities. Sensation intact. Gait not checked.  PSYCHIATRIC: The patient is alert and oriented x 3.  SKIN: No obvious rash, lesion, or ulcer.   LABORATORY PANEL:   CBC Recent Labs  Lab 06/07/18 1300  WBC 9.8  HGB 7.3*  HCT 21.5*  PLT 406   ------------------------------------------------------------------------------------------------------------------  Chemistries  Recent Labs  Lab 06/07/18 1300  NA 135  K 3.7  CL 102  CO2 23  GLUCOSE 110*  BUN 11  CREATININE 0.61  CALCIUM 8.0*  MG 1.7  AST 20  ALT 15  ALKPHOS 133*  BILITOT 0.4   ------------------------------------------------------------------------------------------------------------------  Cardiac Enzymes No results for input(s): TROPONINI in the last 168 hours. ------------------------------------------------------------------------------------------------------------------  RADIOLOGY:  No results found.  EKG:    IMPRESSION AND PLAN:   Claudia Chavez  is a 62 y.o. female with a known history of lung cancer not on any treatment, history of DVT started on Xarelto one month ago, sub acute CVA about a month ago is a direct admit from cancer center with increasing weakness malaise and black tarry stools. Patient was found to be anemic with hemoglobin of 7.3 hemoglobin one month ago prior to starting Xarelto was 10.5.  1. Melena/G.I. Bleed -admit to medical  floor -IV Protonix BID -G.I. consultation with Dr. Alice Reichert -hold Xarelto  2. left lower extremity new onset DVT about a month ago -pt was on Xarelto holding it for now -will start patient on IV heparin drip while her Xarelto is on hold. This was discussed with G.I. Dr. Alice Reichert  3. History of lung cancer not currently on any treatment follows at the cancer center  4. DVT prophylaxis heparin drip  above was discussed with patient and her family in the room  All the records are reviewed and case discussed with ED provider. Management plans  discussed with the patient, family and they are in agreement.  CODE STATUS: full  TOTAL TIME TAKING CARE OF THIS PATIENT: *50* minutes.    Fritzi Mandes M.D on 06/07/2018 at 5:40 PM  Between 7am to 6pm - Pager - [phone]  After 6pm go to [email or web address] - password [email or web address] [facility]  [facility]  Office  [phone]  CC: Primary care physician; Maryland Pink, MD

## 2018-06-07 NOTE — Telephone Encounter (Signed)
Discussed with nurse practitioner, Ms Burns-recommend follow-up in the [facility]- labs possible fluids/steroids. Thanks,Jennifer.

## 2018-06-07 NOTE — Progress Notes (Addendum)
Symptom Management [place]  [phone] [phone]  Patient Care Team: [place] Pink, MD as PCP - General (Family Medicine) Cammie Sickle, MD as Medical Oncologist (Medical Oncology)   Name of the patient: Claudia Chavez  [id number]  09-12-1956   Date of visit: 06/07/18  Diagnosis- Lung Cancer  Chief complaint/ Reason for visit- Weakness   Heme/Onc history:  Oncology History   # Squamous cell carcinoma of right LOWER LOBE OF lung stage IIIA based on PET scan Biopsy of the (August of 2016). 2. After 3 cycles of chemotherapy patient underwent resection of lung mass (November, 2016) ypT2B ypN0 [Dr.Oaks] status post right lower lobectomy with a 3. She was started on carboplatinum and Taxol on a weekly basis and radiation therapy from January of 2017 4. After 2 cycles of carboplatinum patient had neuropathy grade 2 interfering with work so chemotherapy was put on hold and radiation was continued (January 19th, 2017) 5. Chemotherapy was discontinued because of progressive neuropathy. Patient is finishing of radiation therapy on November 19, 2015  # NOV 2017- CT- 102mm right hilar LN; PET- Feb 2018- NED.   # DEC-JAN 2019- RUL Lung nodule [s/p Bronch; Dr.Kasa- non-diagnostic] SBRT Feb 2019     Cancer of lower lobe of right lung [facility])    History of Presenting Illness- Murielle Stang, 62 year old female with above history of lung cancer, presents to [facility] for complaints of generalized weakness. She first noticed symptoms 1 months ago after being started on Xarelto. Associated symptoms since that time: dizziness, fatigue (unable to stay awake at times, sleepiness, too tired to perform ADLs), color change of stools to dark black, mushy at times, hard at others, low blood pressures.  She felt like her symptoms may be related to [place] which she was started on during recent hospitalization.  She was not  able to see neurology in a timely manner and opted to self discontinue Keppra last week.  She says that her symptoms have persisted to slightly worsened since that time and she no longer feels they are associated with Keppra.  She questions if related to Xarelto and asks if she can stop this medicine.  Dizziness is made worse by changing position, describes as spinning room.    Per chart review:  - 05/08/18-left lower extremity pain and edema, history of smoking-ultrasound revealed occlusive DVT involving the popliteal both.  Left posterior tibial and peroneal veins.  Positive for occlusive SVT involving the left greater saphenous vein. CTA was negative. Started on Xarelto.   - 05/21/18-dizziness, near syncope, worsening weakness, shortness of breath. EKG positive for right BBB; referred to cardiology.   05/22/18- MRI Brain w wo contrast for near syncope & dizziness revealed: Lobulated multicystic lesion in the left posterior frontal subcortical white matter measuring up to 13 mm.  Subacute infarction suspected given several small chronic infarcts in bilateral cerebellar hemispheres and mild chronic microvascular changes.  Nonspecific 7 mm enhancement near the left lambdoid suture (ddx fat for hemangioma or metastasis).   05/24/18-admitted to [facility] for presyncopal evaluation.  Neurology consulted. Subacute infarct felt not to be etiology of her syncopal episodes.  Follow-up CTA head and neck performed-negative for large vessel occlusion, noted moderate to severe right and mild to moderate left vertebral artery arteriosclerotic stenosis. Echo 60-65%. Telemetry showed no acute pathology.  Recommendation that she stay hydrated and change positions slowly.  Neurology recommended repeating imaging in November or December 2019 for reevaluation, Keppra 500 mg  twice daily until following up imaging for seizure prophylaxis, and continuing anticoagulation.    ECOG FS:2 - Symptomatic, <50% confined to bed  Review of  systems- Review of Systems  Constitutional: Positive for malaise/fatigue. Negative for chills, fever and weight loss.  HENT: Negative for congestion, ear discharge, ear pain, sinus pain, sore throat and tinnitus.   Eyes: Negative for blurred vision, double vision, photophobia, pain, discharge and redness.  Respiratory: Positive for cough (chronic; no worse) and shortness of breath (with exertion). Negative for sputum production and wheezing.   Cardiovascular: Positive for leg swelling (intermittent; resolves w/ elevation. Hx of dvt and svt LLE per HPI). Negative for chest pain, palpitations, orthopnea and claudication.  Gastrointestinal: Positive for melena. Negative for abdominal pain, blood in stool, constipation, diarrhea, heartburn, nausea and vomiting.  Genitourinary: Negative.   Musculoskeletal: Negative.   Skin: Negative.   Neurological: Positive for dizziness and weakness. Negative for tingling, seizures, loss of consciousness and headaches.  Endo/Heme/Allergies: Bruises/bleeds easily.  Psychiatric/Behavioral: Negative.      Current treatment- s/p SBRT 11/2017  Allergies  Allergen Reactions  . Lyrica [Pregabalin] Other (See Comments)    Made patient feel very dizzy and not feel good.     Past Medical History:  Diagnosis Date  . Cancer of lower lobe of right lung ([place]) 05/07/2015  . COPD (chronic obstructive pulmonary disease) ([place])   . Non-small cell lung cancer ([place])   . Pneumonia 04/2015    Past Surgical History:  Procedure Laterality Date  . ABDOMINAL HYSTERECTOMY    . ELBOW SURGERY Right 1995  . ELECTROMAGNETIC NAVIGATION BROCHOSCOPY N/A 10/25/2017   Procedure: ELECTROMAGNETIC NAVIGATION BRONCHOSCOPY;  Surgeon: [name], MD;  Location: [facility] ORS;  Service: Cardiopulmonary;  Laterality: N/A;  . PORTACATH PLACEMENT Right 05/10/2015   Procedure: INSERTION PORT-A-CATH;  Surgeon: Nestor Lewandowsky, MD;  Location: [facility] ORS;  Service: General;  Laterality: Right;  Marland Kitchen VIDEO ASSISTED  THORACOSCOPY (VATS)/THOROCOTOMY Right 08/18/2015   Procedure: VIDEO ASSISTED THORACOSCOPY (VATS)/THOROCOTOMY;  Surgeon: Nestor Lewandowsky, MD;  Location: [facility] ORS;  Service: General;  Laterality: Right;    Social History   Socioeconomic History  . Marital status: Widowed    Spouse name: Not on file  . Number of children: Not on file  . Years of education: Not on file  . Highest education level: Not on file  Occupational History  . Not on file  Social Needs  . Financial resource strain: Not on file  . Food insecurity:    Worry: Not on file    Inability: Not on file  . Transportation needs:    Medical: Not on file    Non-medical: Not on file  Tobacco Use  . Smoking status: Current Every Day Smoker    Packs/day: 0.50    Years: 40.00    Pack years: 20.00    Types: Cigarettes  . Smokeless tobacco: Never Used  . Tobacco comment: "1 pk or less per day"  Substance and Sexual Activity  . Alcohol use: Yes    Alcohol/week: 2.0 - 4.0 standard drinks    Types: 2 - 4 Cans of beer per week    Comment: beer-occasionally  . Drug use: No  . Sexual activity: Never  Lifestyle  . Physical activity:    Days per week: Not on file    Minutes per session: Not on file  . Stress: Not on file  Relationships  . Social connections:    Talks on phone: Not on file    Gets together:  Not on file    Attends religious service: Not on file    Active member of club or organization: Not on file    Attends meetings of clubs or organizations: Not on file    Relationship status: Not on file  . Intimate partner violence:    Fear of current or ex partner: Not on file    Emotionally abused: Not on file    Physically abused: Not on file    Forced sexual activity: Not on file  Other Topics Concern  . Not on file  Social History Narrative  . Not on file    Family History  Problem Relation Age of Onset  . Stroke Mother   . Lung cancer Father 60    Current Outpatient Medications:  .  albuterol  (PROVENTIL HFA;VENTOLIN HFA) 108 (90 Base) MCG/ACT inhaler, Inhale 1 puff into the lungs every 6 (six) hours as needed for wheezing or shortness of breath., Disp: , Rfl:  .  Cholecalciferol (VITAMIN D3) 1000 units CAPS, Take by mouth 2 (two) times daily., Disp: , Rfl:  .  fluticasone (FLONASE) 50 MCG/ACT nasal spray, SPRAY 2 SPRAYS INTO EACH NOSTRIL EVERY DAY, Disp: , Rfl: 1 .  Fluticasone-Salmeterol (ADVAIR DISKUS) 250-50 MCG/DOSE AEPB, INHALE 1 INHALATION INTO THE LUNGS EVERY 12 (TWELVE) HOURS., Disp: , Rfl:  .  ipratropium-albuterol (DUONEB) 0.5-2.5 (3) MG/3ML SOLN, TAKE 3 MLS BY NEBULIZATION EVERY 4 (FOUR) HOURS AS NEEDED., Disp: 360 mL, Rfl: 3 .  levETIRAcetam (KEPPRA) 500 MG tablet, Take 1 tablet (500 mg total) by mouth 2 (two) times daily., Disp: 60 tablet, Rfl: 3 .  Rivaroxaban 15 & 20 MG TBPK, Take as directed on package, Disp: 51 each, Rfl: 0 .  rosuvastatin (CRESTOR) 20 MG tablet, Take 1 tablet (20 mg total) by mouth at bedtime., Disp: 30 tablet, Rfl: 0 .  vitamin B-12 (CYANOCOBALAMIN) 1000 MCG tablet, Take 1,000 mcg by mouth daily., Disp: , Rfl:  No current facility-administered medications for this visit.   Facility-Administered Medications Ordered in Other Visits:  .  sodium chloride flush (NS) 0.9 % injection 10 mL, 10 mL, Intravenous, PRN, Charlaine Dalton R, MD .  sodium chloride flush (NS) 0.9 % injection 10 mL, 10 mL, Intravenous, PRN, Verlon Au, NP, 10 mL at 06/07/18 1245  Physical exam:  Vitals:   06/07/18 1239  BP: 120/71  Pulse: (!) 110  Resp: (!) 22  Temp: 99.6 F (37.6 C)  TempSrc: Tympanic  SpO2: 100%   Physical Exam  Constitutional: She is oriented to person, place, and time.  Thin built female in no acute distress. Accompanied.   HENT:  Head: Normocephalic and atraumatic.  Right Ear: External ear normal.  Left Ear: External ear normal.  Nose: Nose normal.  Mouth/Throat: Oropharynx is clear and moist. No oropharyngeal exudate.  Eyes: Pupils are  equal, round, and reactive to light. Conjunctivae and EOM are normal. Right eye exhibits no discharge. Left eye exhibits no discharge. No scleral icterus.  Neck: Normal range of motion. Neck supple.  Cardiovascular: Regular rhythm and normal heart sounds. Tachycardia present.  Pulmonary/Chest: Effort normal and breath sounds normal.  Abdominal: Soft. Normal appearance. She exhibits no ascites. Bowel sounds are increased. There is no tenderness. There is no rigidity, no rebound and no guarding.  Genitourinary: Rectal exam shows guaiac positive stool (hemoccult collected; black stool).  Musculoskeletal: She exhibits no edema.  Lymphadenopathy:    She has no cervical adenopathy.  Neurological: She is alert and oriented to person,  place, and time. She has normal strength. No cranial nerve deficit. Coordination and gait normal.  Skin: Skin is warm and dry.  Stage I pressure ulcers on bilateral ischial tuberosities  Psychiatric: She has a normal mood and affect.     CMP Latest Ref Rng & Units 06/07/2018  Glucose 70 - 99 mg/dL 110(H)  BUN 8 - 23 mg/dL 11  Creatinine 0.44 - 1.00 mg/dL 0.61  Sodium 135 - 145 mmol/L 135  Potassium 3.5 - 5.1 mmol/L 3.7  Chloride 98 - 111 mmol/L 102  CO2 22 - 32 mmol/L 23  Calcium 8.9 - 10.3 mg/dL 8.0(L)  Total Protein 6.5 - 8.1 g/dL 6.2(L)  Total Bilirubin 0.3 - 1.2 mg/dL 0.4  Alkaline Phos 38 - 126 U/L 133(H)  AST 15 - 41 U/L 20  ALT 0 - 44 U/L 15   CBC Latest Ref Rng & Units 06/07/2018  WBC 3.6 - 11.0 K/uL 9.8  Hemoglobin 12.0 - 16.0 g/dL 7.3(L)  Hematocrit 35.0 - 47.0 % 21.5(L)  Platelets 150 - 440 K/uL 406    No images are attached to the encounter.  Ct Angio Head W Or Wo Contrast  Result Date: 05/24/2018 CLINICAL DATA:  62 year old female with nonenhancing left frontal white matter lesion on recent MRI favored to be subacute infarct. Recent dizziness, near syncope, weakness. Right lung cancer. EXAM: CT ANGIOGRAPHY HEAD AND NECK TECHNIQUE: Multidetector  CT imaging of the head and neck was performed using the standard protocol during bolus administration of intravenous contrast. Multiplanar CT image reconstructions and MIPs were obtained to evaluate the vascular anatomy. Carotid stenosis measurements (when applicable) are obtained utilizing NASCET criteria, using the distal internal carotid diameter as the denominator. CONTRAST:  85mL ISOVUE-370 IOPAMIDOL (ISOVUE-370) INJECTION 76% COMPARISON:  Brain MRI 05/22/2018. PET-CT 04/01/2018. Chest CTA 05/08/2018. FINDINGS: CT HEAD Brain: Small area of hypodensity in the left anterior corona radiata and subcortical white matter corresponding to the MRI lesion with no surrounding vasogenic edema and no mass effect. Small chronic cerebellar infarcts were better demonstrated by MRI. Otherwise normal for age gray-white matter differentiation throughout the brain. No midline shift, ventriculomegaly, mass effect, evidence of mass lesion, intracranial hemorrhage or evidence of cortically based acute infarction. Calvarium and skull base: Within normal limits. Paranasal sinuses: Paranasal sinuses and mastoids are stable and well pneumatized. Orbits: Visualized orbits and scalp soft tissues are within normal limits. CTA NECK Skeleton: Osteopenia. No acute or suspicious osseous lesion identified. Upper chest: Partially visible abnormal right lung appears not significantly changed since the 05/08/2018 CTA. Underlying centrilobular emphysema. Small superior mediastinal lymph nodes are stable. Other neck: Right IJ approach porta cath. Otherwise negative. No neck mass or lymphadenopathy. Aortic arch: Calcified aortic atherosclerosis. 3 vessel arch configuration. Right carotid system: No brachiocephalic artery or right CCA origin stenosis despite calcified plaque. Mild soft and calcified plaque at the right carotid bifurcation and proximal right ICA without stenosis. Left carotid system: No left CCA origin stenosis despite soft and  calcified plaque. Mostly calcified atherosclerosis at the left ICA origin and bulb without stenosis. Vertebral arteries: No proximal right subclavian artery stenosis despite atherosclerosis. Soft and calcified plaque at the right vertebral artery origin resulting in moderate to severe stenosis (series 12, image 151). Right vertebral artery V2 segment tortuosity with no other plaque or stenosis to the skull base. Up to 50% stenosis of the proximal left subclavian artery due to plaque. Calcified plaque at the left vertebral artery origin resulting in mild to moderate stenosis on series 12,  image 147. Codominant vertebral arteries. No other left vertebral artery plaque or stenosis to the skull base. CTA HEAD Posterior circulation: Codominant distal vertebral arteries are patent without plaque or stenosis. Normal left PICA origin. Dominant appearing right AICA. Patent vertebrobasilar junction and basilar artery without stenosis. Normal SCA and PCA origins. Posterior communicating arteries are diminutive or absent. Bilateral PCA branches are within normal limits. Anterior circulation: Both ICA siphons are patent. Mild calcified plaque on the left. Minimal to mild left supraclinoid ICA stenosis at the anterior genu. Similar calcified plaque on the right with mild right cavernous segment stenosis. Normal ophthalmic artery origins. Normal carotid termini, MCA and ACA origins. Mildly tortuous A1 segments. Anterior communicating artery and bilateral ACA branches are within normal limits. Right MCA M1 segment, bifurcation, and right MCA branches are within normal limits. Left MCA M1 segment and bifurcation are patent without stenosis. Left MCA branches are within normal limits. Venous sinuses: Patent. Anatomic variants: None. Delayed phase: No abnormal enhancement identified (series 16, image 17). Review of the MIP images confirms the above findings IMPRESSION: 1. Negative for large vessel occlusion. 2. Bilateral Carotid  atherosclerosis with no hemodynamically significant stenosis; mild bilateral ICA siphon stenosis. - otherwise negative Anterior circulation. 3. Moderate to severe right and mild-to-moderate left Vertebral Artery origin atherosclerotic stenosis. - otherwise negative Posterior circulation. 4. Stable hypodensity in the left frontal lobe white matter on CT corresponding to the recent MRI lesion. No associated edema, mass effect, or enhancement. - no new acute intracranial abnormality. 5. Partially visible abnormal right lung appears stable since the chest CTA on 05/08/2018. Electronically Signed   By: Genevie Ann M.D.   On: 05/24/2018 13:17   Dg Chest 2 View  Result Date: 05/24/2018 CLINICAL DATA:  Syncopal episodes.  History of lung cancer. EXAM: CHEST - 2 VIEW COMPARISON:  Chest CT 05/08/2018.  Radiographs 10/12/2017. FINDINGS: Right IJ Port-A-Cath tip extends to the superior cavoatrial junction. The heart size and mediastinal contours are stable. There has been partial clearing of the right upper lobe and superior segment lower lobe consolidation compared with the CT of 2 weeks ago. The left lung is clear. There is no pneumothorax or significant pleural effusion. The bones appear unchanged. IMPRESSION: Partial clearing of right lung airspace disease compared with CT of 2 weeks ago, most consistent with improving pneumonia. Some of this could be related to previous radiation therapy. Continued follow-up recommended to document complete clearing. Electronically Signed   By: Richardean Sale M.D.   On: 05/24/2018 12:56   Ct Angio Neck W Or Wo Contrast  Result Date: 05/24/2018 CLINICAL DATA:  62 year old female with nonenhancing left frontal white matter lesion on recent MRI favored to be subacute infarct. Recent dizziness, near syncope, weakness. Right lung cancer. EXAM: CT ANGIOGRAPHY HEAD AND NECK TECHNIQUE: Multidetector CT imaging of the head and neck was performed using the standard protocol during bolus  administration of intravenous contrast. Multiplanar CT image reconstructions and MIPs were obtained to evaluate the vascular anatomy. Carotid stenosis measurements (when applicable) are obtained utilizing NASCET criteria, using the distal internal carotid diameter as the denominator. CONTRAST:  83mL ISOVUE-370 IOPAMIDOL (ISOVUE-370) INJECTION 76% COMPARISON:  Brain MRI 05/22/2018. PET-CT 04/01/2018. Chest CTA 05/08/2018. FINDINGS: CT HEAD Brain: Small area of hypodensity in the left anterior corona radiata and subcortical white matter corresponding to the MRI lesion with no surrounding vasogenic edema and no mass effect. Small chronic cerebellar infarcts were better demonstrated by MRI. Otherwise normal for age gray-white matter differentiation throughout the  brain. No midline shift, ventriculomegaly, mass effect, evidence of mass lesion, intracranial hemorrhage or evidence of cortically based acute infarction. Calvarium and skull base: Within normal limits. Paranasal sinuses: Paranasal sinuses and mastoids are stable and well pneumatized. Orbits: Visualized orbits and scalp soft tissues are within normal limits. CTA NECK Skeleton: Osteopenia. No acute or suspicious osseous lesion identified. Upper chest: Partially visible abnormal right lung appears not significantly changed since the 05/08/2018 CTA. Underlying centrilobular emphysema. Small superior mediastinal lymph nodes are stable. Other neck: Right IJ approach porta cath. Otherwise negative. No neck mass or lymphadenopathy. Aortic arch: Calcified aortic atherosclerosis. 3 vessel arch configuration. Right carotid system: No brachiocephalic artery or right CCA origin stenosis despite calcified plaque. Mild soft and calcified plaque at the right carotid bifurcation and proximal right ICA without stenosis. Left carotid system: No left CCA origin stenosis despite soft and calcified plaque. Mostly calcified atherosclerosis at the left ICA origin and bulb without  stenosis. Vertebral arteries: No proximal right subclavian artery stenosis despite atherosclerosis. Soft and calcified plaque at the right vertebral artery origin resulting in moderate to severe stenosis (series 12, image 151). Right vertebral artery V2 segment tortuosity with no other plaque or stenosis to the skull base. Up to 50% stenosis of the proximal left subclavian artery due to plaque. Calcified plaque at the left vertebral artery origin resulting in mild to moderate stenosis on series 12, image 147. Codominant vertebral arteries. No other left vertebral artery plaque or stenosis to the skull base. CTA HEAD Posterior circulation: Codominant distal vertebral arteries are patent without plaque or stenosis. Normal left PICA origin. Dominant appearing right AICA. Patent vertebrobasilar junction and basilar artery without stenosis. Normal SCA and PCA origins. Posterior communicating arteries are diminutive or absent. Bilateral PCA branches are within normal limits. Anterior circulation: Both ICA siphons are patent. Mild calcified plaque on the left. Minimal to mild left supraclinoid ICA stenosis at the anterior genu. Similar calcified plaque on the right with mild right cavernous segment stenosis. Normal ophthalmic artery origins. Normal carotid termini, MCA and ACA origins. Mildly tortuous A1 segments. Anterior communicating artery and bilateral ACA branches are within normal limits. Right MCA M1 segment, bifurcation, and right MCA branches are within normal limits. Left MCA M1 segment and bifurcation are patent without stenosis. Left MCA branches are within normal limits. Venous sinuses: Patent. Anatomic variants: None. Delayed phase: No abnormal enhancement identified (series 16, image 17). Review of the MIP images confirms the above findings IMPRESSION: 1. Negative for large vessel occlusion. 2. Bilateral Carotid atherosclerosis with no hemodynamically significant stenosis; mild bilateral ICA siphon  stenosis. - otherwise negative Anterior circulation. 3. Moderate to severe right and mild-to-moderate left Vertebral Artery origin atherosclerotic stenosis. - otherwise negative Posterior circulation. 4. Stable hypodensity in the left frontal lobe white matter on CT corresponding to the recent MRI lesion. No associated edema, mass effect, or enhancement. - no new acute intracranial abnormality. 5. Partially visible abnormal right lung appears stable since the chest CTA on 05/08/2018. Electronically Signed   By: Genevie Ann M.D.   On: 05/24/2018 13:17   Mr Jeri Cos XN Contrast  Result Date: 05/22/2018 CLINICAL DATA:  62 y/o F; multiple episodes of syncope. History of lung cancer. EXAM: MRI HEAD WITHOUT AND WITH CONTRAST TECHNIQUE: Multiplanar, multiecho pulse sequences of the brain and surrounding structures were obtained without and with intravenous contrast. CONTRAST:  28mL MULTIHANCE GADOBENATE DIMEGLUMINE 529 MG/ML IV SOLN COMPARISON:  04/01/2018 PET-CT. FINDINGS: Brain: Very small chronic infarcts within the bilateral  cerebellar hemispheres. Few nonspecific foci of T2 FLAIR hyperintense signal abnormality in subcortical and periventricular white matter are compatible mild chronic microvascular ischemic changes. Mild volume loss of the brain. No extra-axial collection, hydrocephalus, or herniation. Lobulated multi cystic lesion within the left posterior frontal subcortical white matter measuring 13 x 12 x 13 mm (AP x ML x CC series 10, image 18 and series 15, image 15). The fluid components do not demonstrate reduced diffusion. The T2 hyperintense rim components are T2, FLAIR, diffusion hyperintense but do not demonstrate reduced diffusion on ADC. After administration of intravenous contrast there is no abnormal enhancement. Vascular: Normal flow voids. Skull and upper cervical spine: 7 mm nonspecific enhancing focus near left lambdoid suture (series 16, image 115). Sinuses/Orbits: Negative. Other: None.  IMPRESSION: 1. Lobulated multi-cystic lesion in the left posterior frontal subcortical white matter measuring up to 13 mm. No reduced diffusion, blood products, or enhancement. Given additional small infarcts in the brain, subacute infarction is suspected. Some differential considerations include cystic CNS glioma/oligodendroglioma, nonenhancing intracranial metastasis, or non pyogenic abscess. Follow-up is recommended. 2. Small chronic infarcts in the bilateral cerebellar hemispheres. Mild chronic microvascular ischemic changes and volume loss of the brain. 3. Nonspecific 7 mm focus of calvarial enhancement near left lambdoid suture. Differential includes fat poor hemangioma or metastasis. Attention at follow-up. Electronically Signed   By: Kristine Garbe M.D.   On: 05/22/2018 18:33    Assessment and plan- Patient is a 62 y.o. female diagnosed with Lung Cancer who presents to Symptom Management for weakness since starting Xarelto on 05/08/2018.  1. Lung Cancer- right upper lobe stage I lunge cancer (biopsy nondiagnostic- s/p 2/22 SBRT; CT scan suggestive of radiation changes.  PET scan also suggestive of radiation changes and less likely recurrence. Stable.   2.  Left lung cancer-stage III-stable  3. Hx of DVT - US 05/08/18 left leg positive for occlusive DVT involving popliteal both paired left posterior tibial and peroneal veins. Positive for occlusive SVT involving left greater saphenous vein. Started Xarelto 05/08/18. Continues Xarelto.   4. Hx of sub-acute infarct and chronic infarct- MRI Brain 05/22/18 revealed: Subacute infarct in left posterior frontal subcortical white matter measuring 13 mm and several small chronic infarcts in bilateral cerebellar hemispheres with chronic microvascular changes.  Nonspecific 7 mm enhancement near left lambdoid suture with differential diagnosis of fat poor hemangioma or metastasis.  Neurology was consulted during prior admission.  Recommendation to continue  Xarelto and repeat imaging in 09/2018.  Start Keppra 500 mg twice daily for seizure prophylaxis and consider stopping after repeat imaging.  Patient self discontinued Keppra 1 week ago due to weakness which was likely related to anemia (see below).  May consider restarting Keppra.  5. GI bleed- On Xarelto for above. Weak, black stools, fatigue. Positive hemoccult. Hmg dropped to 7.3 today (12.3 on 05/08/18). Consulted GI, Dr. Alice Reichert, who recommends hospitalization for transition to IV heparin (given hx of subacute infarcts) with planned scope this weekend.   6. Symptomatic Anemia- Hmg 7.3 today (baseline 12-13). LDH pending. Orthostatic, tachycardic. IV fluids given in clinic. LV EF 60-65% on 05/24/18.  Per Dr. Alice Reichert, recommend transfusing 2 units PRBCs.   Discussed findings with Dr. Rogue Bussing who recommended consulting GI. Spoke with Dr. Alice Reichert who made recommendations as above and recommendation for hospitalization. Discussed with Dr. Posey Pronto who kindly agrees to admit.     Visit Diagnosis 1. Cancer of lower lobe of right lung ([place])   2. Weakness   3. History of DVT  in adulthood   4. Gastrointestinal hemorrhage, unspecified gastrointestinal hemorrhage type   5. Symptomatic anemia     Patient expressed understanding and was in agreement with this plan. She also understands that She can call clinic at any time with any questions, concerns, or complaints.   Thank you for allowing me to participate in the care of this very pleasant patient.   Beckey Rutter, DNP, AGNP-C [place] at [facility] [phone] (work cell) [phone] (office)

## 2018-06-07 NOTE — Progress Notes (Signed)
Family Meeting Note  Patient is being admitted for melena/G.I. bleed with drop in hemoglobin more than 3 g in a month after she was started on Xarelto for DVT left lower extremity. She also has a sub acute infarct in August 2019. Patient has history of lung cancer. Code status discussed with patient she wants to be full code. Time spent 1 16 minutes. Fritzi Mandes, MD

## 2018-06-07 NOTE — Progress Notes (Signed)
Orders placed.

## 2018-06-07 NOTE — Telephone Encounter (Addendum)
Patient called to report that she is not getting any better, in fact she feels worse. She quit her Keppra stating that it made her feel bad and she had spoken with PCP twice who on last call told her she needs to see Neurologist about it. She states she called neurology and her appointment is not for 3 months and she said at that point, she just stopped taking medicine. She has been feeling weak, dizzy and has a b/p in 90/55 range. I disucused with Lorretta Harp, NP who then consulted Dr Rogue Bussing and patient was advised to go to the ER for evaluation. Patient in agreement with this and states she will go as soon as she can find someone to take her there

## 2018-06-07 NOTE — Progress Notes (Signed)
  Thanks  M 

## 2018-06-08 ENCOUNTER — Encounter
Admission: AD | Disposition: A | Discharge status: Home / Self Care | Payer: Self-pay | Source: Ambulatory Visit | Attending: Internal Medicine

## 2018-06-08 ENCOUNTER — Encounter: Payer: Self-pay | Admitting: Anesthesiology

## 2018-06-08 ENCOUNTER — Inpatient Hospital Stay: Payer: PPO | Admitting: Anesthesiology

## 2018-06-08 DIAGNOSIS — K209 Esophagitis, unspecified: Secondary | ICD-10-CM | POA: Diagnosis not present

## 2018-06-08 DIAGNOSIS — J449 Chronic obstructive pulmonary disease, unspecified: Secondary | ICD-10-CM | POA: Diagnosis not present

## 2018-06-08 DIAGNOSIS — Z8673 Personal history of transient ischemic attack (TIA), and cerebral infarction without residual deficits: Secondary | ICD-10-CM | POA: Diagnosis not present

## 2018-06-08 DIAGNOSIS — K2970 Gastritis, unspecified, without bleeding: Secondary | ICD-10-CM | POA: Diagnosis not present

## 2018-06-08 DIAGNOSIS — K449 Diaphragmatic hernia without obstruction or gangrene: Secondary | ICD-10-CM | POA: Diagnosis not present

## 2018-06-08 HISTORY — PX: ESOPHAGOGASTRODUODENOSCOPY: SHX5428

## 2018-06-08 LAB — CBC
HEMATOCRIT: 28.6 % — AB (ref 35.0–47.0)
Hemoglobin: 10 g/dL — ABNORMAL LOW (ref 12.0–16.0)
MCH: 32.6 pg (ref 26.0–34.0)
MCHC: 35.2 g/dL (ref 32.0–36.0)
MCV: 92.7 fL (ref 80.0–100.0)
Platelets: 354 10*3/uL (ref 150–440)
RBC: 3.08 MIL/uL — ABNORMAL LOW (ref 3.80–5.20)
RDW: 16.6 % — AB (ref 11.5–14.5)
WBC: 7.6 10*3/uL (ref 3.6–11.0)

## 2018-06-08 LAB — HEPARIN LEVEL (UNFRACTIONATED): Heparin Unfractionated: 0.11 IU/mL — ABNORMAL LOW (ref 0.30–0.70)

## 2018-06-08 LAB — APTT
APTT: 35 s (ref 24–36)
aPTT: 27 seconds (ref 24–36)

## 2018-06-08 SURGERY — EGD (ESOPHAGOGASTRODUODENOSCOPY)
Anesthesia: General

## 2018-06-08 MED ORDER — HEPARIN BOLUS VIA INFUSION
3000.0000 [IU] | Freq: Once | INTRAVENOUS | Status: AC
  Administered 2018-06-08: 22:00:00 3000 [IU] via INTRAVENOUS
  Filled 2018-06-08: qty 3000

## 2018-06-08 MED ORDER — POLYETHYLENE GLYCOL 3350 17 GM/SCOOP PO POWD
1.0000 | Freq: Once | ORAL | Status: AC
  Administered 2018-06-08: 255 g via ORAL
  Filled 2018-06-08: qty 255

## 2018-06-08 MED ORDER — PROPOFOL 10 MG/ML IV BOLUS
INTRAVENOUS | Status: AC
  Filled 2018-06-08: qty 20

## 2018-06-08 MED ORDER — IPRATROPIUM-ALBUTEROL 0.5-2.5 (3) MG/3ML IN SOLN
3.0000 mL | Freq: Four times a day (QID) | RESPIRATORY_TRACT | Status: DC
  Administered 2018-06-08: 3 mL via RESPIRATORY_TRACT

## 2018-06-08 MED ORDER — SODIUM CHLORIDE 0.9 % IV SOLN
INTRAVENOUS | Status: DC
  Administered 2018-06-08: 14:00:00 via INTRAVENOUS

## 2018-06-08 MED ORDER — BISACODYL 5 MG PO TBEC
10.0000 mg | DELAYED_RELEASE_TABLET | Freq: Once | ORAL | Status: AC
  Administered 2018-06-08: 10 mg via ORAL
  Filled 2018-06-08: qty 2

## 2018-06-08 MED ORDER — PROPOFOL 10 MG/ML IV BOLUS
INTRAVENOUS | Status: DC | PRN
  Administered 2018-06-08: 50 mg via INTRAVENOUS

## 2018-06-08 MED ORDER — SODIUM CHLORIDE 0.9 % IV SOLN
INTRAVENOUS | Status: DC
  Administered 2018-06-08 (×2): via INTRAVENOUS

## 2018-06-08 MED ORDER — IPRATROPIUM-ALBUTEROL 0.5-2.5 (3) MG/3ML IN SOLN
RESPIRATORY_TRACT | Status: AC
  Administered 2018-06-08: 3 mL via RESPIRATORY_TRACT
  Filled 2018-06-08: qty 3

## 2018-06-08 MED ORDER — IPRATROPIUM-ALBUTEROL 0.5-2.5 (3) MG/3ML IN SOLN: 3.0000 mL | Freq: Four times a day (QID) | RESPIRATORY_TRACT | Status: DC | PRN

## 2018-06-08 NOTE — Anesthesia Postprocedure Evaluation (Signed)
Anesthesia Post Note  Patient: Claudia Chavez  Procedure(s) Performed: ESOPHAGOGASTRODUODENOSCOPY (EGD) (N/A )  Patient location during evaluation: Endoscopy Anesthesia Type: General Level of consciousness: awake and alert Pain management: pain level controlled Vital Signs Assessment: post-procedure vital signs reviewed and stable Respiratory status: spontaneous breathing, nonlabored ventilation, respiratory function stable and patient connected to nasal cannula oxygen Cardiovascular status: blood pressure returned to baseline and stable Postop Assessment: no apparent nausea or vomiting Anesthetic complications: no     Last Vitals:  Vitals:   06/08/18 1301 06/08/18 1307  BP: 109/62   Pulse: (!) 106   Resp: 18 20  Temp:    SpO2: 99% 98%    Last Pain:  Vitals:   06/08/18 1301  TempSrc:   PainSc: 0-No pain                 Kendell Gammon S

## 2018-06-08 NOTE — Progress Notes (Signed)
[place] [facility] [place] NAME: Claudia Chavez    MR#:  [id number]  DATE OF BIRTH:  06-10-1956  SUBJECTIVE:  CHIEF COMPLAINT:  No chief complaint on file.  -No further dark stools today.  Received transfusion and hemoglobin is improved. -Going for EGD today  REVIEW OF SYSTEMS:  Review of Systems  Constitutional: Positive for malaise/fatigue. Negative for chills and fever.  HENT: Negative for ear discharge, hearing loss and nosebleeds.   Respiratory: Negative for cough, shortness of breath and wheezing.   Cardiovascular: Negative for chest pain and palpitations.  Gastrointestinal: Negative for abdominal pain, constipation, diarrhea, nausea and vomiting.  Genitourinary: Negative for dysuria.  Musculoskeletal: Negative for myalgias.  Neurological: Negative for dizziness, focal weakness, seizures, weakness and headaches.  Psychiatric/Behavioral: Negative for depression.    DRUG ALLERGIES:   Allergies  Allergen Reactions  . Keppra [Levetiracetam] Other (See Comments)    Nausea and dizzy  . Lyrica [Pregabalin] Other (See Comments)    Made patient feel very dizzy and not feel good.     VITALS:  Blood pressure 129/69, pulse 88, temperature (!) 97.1 F (36.2 C), temperature source Tympanic, resp. rate 18, height 5\' 1"  (1.549 m), weight 46.6 kg, SpO2 97 %.  PHYSICAL EXAMINATION:  Physical Exam  GENERAL:  62 y.o.-year-old patient lying in the bed with no acute distress.  EYES: Pupils equal, round, reactive to light and accommodation. No scleral icterus. Extraocular muscles intact.  Pale conjunctiva noted HEENT: Head atraumatic, normocephalic. Oropharynx and nasopharynx clear.  NECK:  Supple, no jugular venous distention. No thyroid enlargement, no tenderness.  LUNGS: Normal breath sounds bilaterally, no wheezing, rales,rhonchi or crepitation. No use of accessory muscles of respiration.  CARDIOVASCULAR: S1, S2 normal. No rubs, or gallops.  2/6 systolic murmur is present ABDOMEN: Soft, nontender, nondistended. Bowel sounds present. No organomegaly or mass.  EXTREMITIES: No pedal edema, cyanosis, or clubbing.  NEUROLOGIC: Cranial nerves II through XII are intact. Muscle strength 5/5 in all extremities. Sensation intact. Gait not checked.  PSYCHIATRIC: The patient is alert and oriented x 3.  SKIN: No obvious rash, lesion, or ulcer.    LABORATORY PANEL:   CBC Recent Labs  Lab 06/08/18 0943  WBC 7.6  HGB 10.0*  HCT 28.6*  PLT 354   ------------------------------------------------------------------------------------------------------------------  Chemistries  Recent Labs  Lab 06/07/18 1300  NA 135  K 3.7  CL 102  CO2 23  GLUCOSE 110*  BUN 11  CREATININE 0.61  CALCIUM 8.0*  MG 1.7  AST 20  ALT 15  ALKPHOS 133*  BILITOT 0.4   ------------------------------------------------------------------------------------------------------------------  Cardiac Enzymes No results for input(s): TROPONINI in the last 168 hours. ------------------------------------------------------------------------------------------------------------------  RADIOLOGY:  No results found.  EKG:   Orders placed or performed in visit on 05/08/18  . EKG 12-Lead  . EKG 12-Lead  . EKG 12-Lead    ASSESSMENT AND PLAN:   62 year old female with past medical history significant for stage III left lung cancer which is stable status post chemoradiation and following at the cancer center, recent diagnosis of DVT on Xarelto, history of mini strokes, presents to hospital secondary to weakness and dark tarry stools.  1.  Melena-likely upper GI bleed.  Hemoglobin drop noted from 10.5 to 7.3 now -Received 2 units packed RBC transfusion and hemoglobin is at 10 -Continue IV Protonix.  Appreciate GI consult -Xarelto on hold. -For EGD today.  If EGD is unremarkable, will get colonoscopy this admission.  2.  Recent history  of left lower extremity  DVT-Xarelto is on hold.  Being bridged with IV heparin while in the hospital.  3.  History of lung cancer-follows up with cancer center.  Status post chemoradiation, currently being monitored.  4.  Recent history of stroke-MRI brain done last month showing white matter changes concerning for subacute infarcts versus a cystic lesion.  Anticoagulation is currently on hold. -Was started on Keppra, however patient stopped taking it as outpatient due to significant side effects. -Outpatient neurology follow-up  5.  DVT prophylaxis-currently on heparin drip     All the records are reviewed and case discussed with Care Management/Social Workerr. Management plans discussed with the patient, family and they are in agreement.  CODE STATUS: Full Code  TOTAL TIME TAKING CARE OF THIS PATIENT: 38 minutes.   POSSIBLE D/C IN 1-2 DAYS, DEPENDING ON CLINICAL CONDITION.   Gladstone Lighter M.D on 06/08/2018 at 11:58 AM  Between 7am to 6pm - Pager - [phone]  After 6pm go to [email or web address] - password [email or web address] [facility]  Office  [phone]  CC: Primary care physician; [place] Pink, MD

## 2018-06-08 NOTE — H&P (View-Only) (Signed)
[facility] GI Inpatient Consult Note   [name], M.D.  Reason for Consult: Melena, anemia   Attending Requesting Consult: [name], MD   History of Present Illness: Claudia Chavez is a 62 y.o. female with a history of lung cancer, stage III who presents for anemia found by her hematologist/oncologist.  Hemoglobin has dropped from around from 12 to around 7.3.  Patient noticed intermittent black stools that were solid as well as some complaints of constipation.  There is no complaint of dysphagia or recent weight loss.  Patient does not take NSAIDs on a regular basis.  Denies abdominal pain.  Last colonoscopy was 10 years or more in the past and was unremarkable.  Patient has noticed intermittent melena ever since beginning Xarelto for venous thrombosis 2 months ago.  Past Medical History:  Past Medical History:  Diagnosis Date  . Cancer of lower lobe of right lung ([place]) 05/07/2015  . COPD (chronic obstructive pulmonary disease) ([place])   . Non-small cell lung cancer ([place])   . Pneumonia 04/2015    Problem List: Patient Active Problem List   Diagnosis Date Noted  . Symptomatic anemia 06/07/2018  . GI bleed 06/07/2018  . CVA (cerebral vascular accident) ([place]) 05/24/2018  . Syncope 05/24/2018  . Lung nodule   . Chronic bronchitis ([place]) 10/21/2015  . Cancer of lower lobe of right lung ([place]) 05/07/2015  . Bronchitis, chronic ([place]) 04/28/2015  . Osteoporosis 04/28/2015  . Overactive bladder 04/28/2015  . Situational depression 04/28/2015  . Carpal tunnel syndrome 07/13/2014  . Cervical radiculitis 07/13/2014  . Cervical spinal stenosis 07/13/2014  . Carpal tunnel syndrome of left wrist 07/13/2014    Past Surgical History: Past Surgical History:  Procedure Laterality Date  . ABDOMINAL HYSTERECTOMY    . ELBOW SURGERY Right 1995  . ELECTROMAGNETIC NAVIGATION BROCHOSCOPY N/A 10/25/2017   Procedure: ELECTROMAGNETIC NAVIGATION BRONCHOSCOPY;  Surgeon: [name], MD;   Location: [facility] ORS;  Service: Cardiopulmonary;  Laterality: N/A;  . PORTACATH PLACEMENT Right 05/10/2015   Procedure: INSERTION PORT-A-CATH;  Surgeon: Nestor Lewandowsky, MD;  Location: [facility] ORS;  Service: General;  Laterality: Right;  Marland Kitchen VIDEO ASSISTED THORACOSCOPY (VATS)/THOROCOTOMY Right 08/18/2015   Procedure: VIDEO ASSISTED THORACOSCOPY (VATS)/THOROCOTOMY;  Surgeon: Nestor Lewandowsky, MD;  Location: [facility] ORS;  Service: General;  Laterality: Right;    Allergies: Allergies  Allergen Reactions  . Keppra [Levetiracetam] Other (See Comments)    Nausea and dizzy  . Lyrica [Pregabalin] Other (See Comments)    Made patient feel very dizzy and not feel good.     Home Medications: Medications Prior to Admission  Medication Sig Dispense Refill Last Dose  . albuterol (PROVENTIL HFA;VENTOLIN HFA) 108 (90 Base) MCG/ACT inhaler Inhale 1 puff into the lungs every 6 (six) hours as needed for wheezing or shortness of breath.   PRN at PRN  . Cholecalciferol (VITAMIN D3) 1000 units CAPS Take by mouth 2 (two) times daily.   06/07/2018 at Unknown time  . fluticasone (FLONASE) 50 MCG/ACT nasal spray Place 2 sprays into both nostrils daily.   1 06/07/2018 at Unknown time  . Fluticasone-Salmeterol (ADVAIR DISKUS) 250-50 MCG/DOSE AEPB Inhale 1 puff into the lungs every 12 (twelve) hours.    06/07/2018 at Unknown time  . levETIRAcetam (KEPPRA) 500 MG tablet Take 1 tablet (500 mg total) by mouth 2 (two) times daily. 60 tablet 3 06/07/2018 at Unknown time  . Rivaroxaban 15 & 20 MG TBPK Take as directed on package 51 each 0 06/07/2018 at Unknown time  .  rosuvastatin (CRESTOR) 20 MG tablet Take 1 tablet (20 mg total) by mouth at bedtime. 30 tablet 0 06/06/2018 at Unknown time  . vitamin B-12 (CYANOCOBALAMIN) 1000 MCG tablet Take 1,000 mcg by mouth daily.   06/07/2018 at Unknown time  . ipratropium-albuterol (DUONEB) 0.5-2.5 (3) MG/3ML SOLN TAKE 3 MLS BY NEBULIZATION EVERY 4 (FOUR) HOURS AS NEEDED. 360 mL 3 PRN at PRN   Home medication  reconciliation was completed with the patient.   Scheduled Inpatient Medications:   . Influenza vac split quadrivalent PF  0.5 mL Intramuscular Tomorrow-1000  . pantoprazole (PROTONIX) IV  40 mg Intravenous Q12H  . pneumococcal 23 valent vaccine  0.5 mL Intramuscular Tomorrow-1000    Continuous Inpatient Infusions:   . heparin 750 Units/hr (06/08/18 0324)    PRN Inpatient Medications:  acetaminophen **OR** acetaminophen, ondansetron **OR** ondansetron (ZOFRAN) IV  Family History: family history includes Lung cancer (age of onset: 42) in her father; Stroke in her mother.   GI Family History: Negative  Social History:   reports that she has been smoking cigarettes. She has a 20.00 pack-year smoking history. She has never used smokeless tobacco. She reports that she drinks about 2.0 - 4.0 standard drinks of alcohol per week. She reports that she does not use drugs. The patient denies ETOH, tobacco, or drug use.    Review of Systems: Review of Systems - General ROS: positive for  - fatigue Psychological ROS: negative ENT ROS: negative Allergy and Immunology ROS: negative Endocrine ROS: negative Respiratory ROS: no cough, shortness of breath, or wheezing Cardiovascular ROS: no chest pain or dyspnea on exertion Gastrointestinal ROS: positive for - melena Musculoskeletal ROS: negative Neurological ROS: no TIA or stroke symptoms Dermatological ROS: negative  Physical Examination: BP 118/63 (BP Location: Left Arm)   Pulse 92   Temp 98.5 F (36.9 C) (Oral)   Resp 18   Ht 5\' 1"  (1.549 m)   Wt 46.6 kg   SpO2 97%   BMI 19.40 kg/m  Physical Exam  Constitutional: She is oriented to person, place, and time. She appears well-developed and well-nourished.  HENT:  Head: Normocephalic and atraumatic.  Eyes: Pupils are equal, round, and reactive to light.  Neck: Normal range of motion. Neck supple.  Cardiovascular: Normal rate and regular rhythm.  Pulmonary/Chest: Effort normal and  breath sounds normal.  Abdominal: Soft. Bowel sounds are normal.  Musculoskeletal: Normal range of motion.  Neurological: She is alert and oriented to person, place, and time.  Skin: Skin is warm and dry.  Psychiatric: She has a normal mood and affect. Her behavior is normal.    Data: Lab Results  Component Value Date   WBC 9.8 06/07/2018   HGB 7.3 (L) 06/07/2018   HCT 21.5 (L) 06/07/2018   MCV 98.9 06/07/2018   PLT 406 06/07/2018   Recent Labs  Lab 06/07/18 1300  HGB 7.3*   Lab Results  Component Value Date   NA 135 06/07/2018   K 3.7 06/07/2018   CL 102 06/07/2018   CO2 23 06/07/2018   BUN 11 06/07/2018   CREATININE 0.61 06/07/2018   Lab Results  Component Value Date   ALT 15 06/07/2018   AST 20 06/07/2018   ALKPHOS 133 (H) 06/07/2018   BILITOT 0.4 06/07/2018   Recent Labs  Lab 06/07/18 1951  APTT 28   CBC Latest Ref Rng & Units 06/07/2018 05/24/2018 05/21/2018  WBC 3.6 - 11.0 K/uL 9.8 11.0 9.6  Hemoglobin 12.0 - 16.0 g/dL 7.3(L) 10.5(L) 10.7(L)  Hematocrit 35.0 - 47.0 % 21.5(L) 29.8(L) 30.8(L)  Platelets 150 - 440 K/uL 406 629(H) 563(H)    STUDIES: No results found. @IMAGES @  Assessment:  1.  Melena-likely secondary to an upper GI source.  Cannot rule out a right colonic source.  Cannot rule out malignancy although peptic ulcer disease is more likely.  2.  Stage III non-small cell carcinoma of the lung-status post radiation therapy.  3.  History of pneumonia.  Recommendations: 1.  IV acid suppression, liquids as tolerated. 2.  N.p.o. after midnight.  This is been done. 3.  EGD.The patient understands the nature of the planned procedure, indications, risks, alternatives and potential complications including but not limited to bleeding, infection, perforation, damage to internal organs and possible oversedation/side effects from anesthesia. The patient agrees and gives consent to proceed.  Please refer to procedure notes for findings, recommendations and  patient disposition/instructions.  4.  EGD is negative for source of bleeding, colonoscopy will be performed. 5.  We will follow along with you.  Thank you for the consult. Please call with questions or concerns.  Olean Ree, "Lanny Hurst MD [facility] [place], [place] [address] [phone]  06/08/2018 3:41 AM

## 2018-06-08 NOTE — Progress Notes (Signed)
ANTICOAGULATION CONSULT NOTE   Pharmacy Consult for Heparin drip Indication: Patient on Xarelto- transition to Heparin drip  Allergies  Allergen Reactions  . Keppra [Levetiracetam] Other (See Comments)    Nausea and dizzy  . Lyrica [Pregabalin] Other (See Comments)    Made patient feel very dizzy and not feel good.     Patient Measurements: Height: 5\' 1"  (154.9 cm) Weight: 102 lb 11 oz (46.6 kg) IBW/kg (Calculated) : 47.8 Heparin Dosing Weight: 46.6 kg  Vital Signs: Temp: 97.1 F (36.2 C) (09/07 1148) Temp Source: Tympanic (09/07 1148) BP: 129/69 (09/07 1148) Pulse Rate: 88 (09/07 1148)  Labs: Recent Labs    06/07/18 1300 06/07/18 1951 06/08/18 0943 06/08/18 1036  HGB 7.3*  --  10.0*  --   HCT 21.5*  --  28.6*  --   PLT 406  --  354  --   APTT  --  28  --  27  HEPARINUNFRC  --  0.36  --  0.11*  CREATININE 0.61  --   --   --     Estimated Creatinine Clearance: 53 mL/min (by C-G formula based on SCr of 0.61 mg/dL).   Medical History: Past Medical History:  Diagnosis Date  . Cancer of lower lobe of right lung ([place]) 05/07/2015  . COPD (chronic obstructive pulmonary disease) ([place])   . Non-small cell lung cancer ([place])   . Pneumonia 04/2015    Medications:  Scheduled:  . ipratropium-albuterol  3 mL Nebulization Q6H  . [MAR Hold] pantoprazole (PROTONIX) IV  40 mg Intravenous Q12H   Infusions:  . sodium chloride 20 mL/hr at 06/08/18 1153  . heparin Stopped (06/08/18 0935)    Assessment: 62 yo F to transition from Xarelto to Heparin drip. Patient with GI bleed, hx of lung cancer started on Xarelto a month ago for DVT and subacute CVA. Patient on Xarelto PTA with last dose on 06/06/18 in the evening around 2000. Hgb 7.3  Plt 406  HL 0.36  APTT 28   Goal of Therapy:  APTT 66-102 Heparin level 0.3-0.7 units/ml Monitor platelets by anticoagulation protocol: Yes   Plan:  No bolus per MD.  Will order Heparin 750 units/hr to start at 2000 tonight 06/07/18.  (currently has not been started yet). Will check aPTTs per protocol and Heparin levels once a day until both correlate, then switch to just HLs. Will order aptt in 6 hours.  9/7: aPTT ordered for 0400 but patient receiving blood so lab draw was delayed until 0930 Patient now going for endoscopy and Heparin drip was stopped at 0930. Blood work was sent to lab but they were unable to result (per RN) so more blood work drawn ~1030.  APTT@ 1036= 27,  HL @ 1036= 0.11. These were drawn 1 hour after Heparin drip had been stopped. Will follow up for resumption of Heparin drip after procedure.   Bailee Thall A 06/08/2018,12:47 PM

## 2018-06-08 NOTE — Progress Notes (Signed)
ANTICOAGULATION CONSULT NOTE   Pharmacy Consult for Heparin drip Indication: Patient on Xarelto- transition to Heparin drip  Allergies  Allergen Reactions  . Keppra [Levetiracetam] Other (See Comments)    Nausea and dizzy  . Lyrica [Pregabalin] Other (See Comments)    Made patient feel very dizzy and not feel good.     Patient Measurements: Height: 5\' 1"  (154.9 cm) Weight: 102 lb 11 oz (46.6 kg) IBW/kg (Calculated) : 47.8 Heparin Dosing Weight: 46.6 kg  Vital Signs: Temp: 96.8 F (36 C) (09/07 1246) Temp Source: Tympanic (09/07 1246) BP: 109/62 (09/07 1301) Pulse Rate: 106 (09/07 1301)  Labs: Recent Labs    06/07/18 1300 06/07/18 1951 06/08/18 0943 06/08/18 1036  HGB 7.3*  --  10.0*  --   HCT 21.5*  --  28.6*  --   PLT 406  --  354  --   APTT  --  28  --  27  HEPARINUNFRC  --  0.36  --  0.11*  CREATININE 0.61  --   --   --     Estimated Creatinine Clearance: 53 mL/min (by C-G formula based on SCr of 0.61 mg/dL).   Medical History: Past Medical History:  Diagnosis Date  . Cancer of lower lobe of right lung ([place]) 05/07/2015  . COPD (chronic obstructive pulmonary disease) ([place])   . Non-small cell lung cancer ([place])   . Pneumonia 04/2015    Medications:  Scheduled:  . bisacodyl  10 mg Oral Once  . ipratropium-albuterol  3 mL Nebulization Q6H  . pantoprazole (PROTONIX) IV  40 mg Intravenous Q12H  . polyethylene glycol powder  1 Container Oral Once   Infusions:  . sodium chloride    . heparin Stopped (06/08/18 0935)    Assessment: 62 yo F to transition from Xarelto to Heparin drip. Patient with GI bleed, hx of lung cancer started on Xarelto a month ago for DVT and subacute CVA. Patient on Xarelto PTA with last dose on 06/06/18 in the evening around 2000. Hgb 7.3  Plt 406  HL 0.36  APTT 28   Goal of Therapy:  APTT 66-102 Heparin level 0.3-0.7 units/ml Monitor platelets by anticoagulation protocol: Yes   Plan:  No bolus per MD.  Will order Heparin 750  units/hr to start at 2000 tonight 06/07/18. (currently has not been started yet). Will check aPTTs per protocol and Heparin levels once a day until both correlate, then switch to just HLs. Will order aptt in 6 hours.  9/7: aPTT ordered for 0400 but patient receiving blood so lab draw was delayed until 0930 Patient now going for endoscopy and Heparin drip was stopped at 0930. Blood work was sent to lab but they were unable to result (per RN) so more blood work drawn ~1030.  APTT@ 1036= 27,  HL @ 1036= 0.11. These were drawn 1 hour after Heparin drip had been stopped. Will follow up for resumption of Heparin drip after procedure.  9/7- Patient to be resumed on Heparin drip. Previous aPTT low (see above note for today). Will slightly increase drip to 800 units/hr. Will check aPTT in 6 hours. Will plan to reassess HLwith labs/aptt on 9/8.  Olukemi Panchal A 06/08/2018,1:37 PM

## 2018-06-08 NOTE — Transfer of Care (Signed)
Immediate Anesthesia Transfer of Care Note  Patient: Claudia Chavez  Procedure(s) Performed: ESOPHAGOGASTRODUODENOSCOPY (EGD) (N/A )  Patient Location: PACU  Anesthesia Type:General  Level of Consciousness: awake and alert   Airway & Oxygen Therapy: Patient Spontanous Breathing and Patient connected to nasal cannula oxygen  Post-op Assessment: Report given to RN  Post vital signs: Reviewed and stable  Last Vitals:  Vitals Value Taken Time  BP    Temp    Pulse    Resp    SpO2      Last Pain:  Vitals:   06/08/18 1148  TempSrc: Tympanic  PainSc:          Complications: No apparent anesthesia complications

## 2018-06-08 NOTE — Op Note (Signed)
[facility] Gastroenterology Patient Name: Claudia Chavez Procedure Date: 06/08/2018 12:04 PM MRN: [id number] Account #: [id number] Date of Birth: [birth year] Admit Type: Outpatient Age: 62 Room: [facility] ENDO ROOM 4 Gender: Female Note Status: Finalized Procedure:            Upper GI endoscopy Indications:          Acute post hemorrhagic anemia, Melena Providers:            Benay Pike. Robena Ewy MD, MD Medicines:            Propofol per Anesthesia Complications:        No immediate complications. Procedure:            Pre-Anesthesia Assessment:                       - The risks and benefits of the procedure and the                        sedation options and risks were discussed with the                        patient. All questions were answered and informed                        consent was obtained.                       - Patient identification and proposed procedure were                        verified prior to the procedure by the nurse. The                        procedure was verified in the procedure room.                       - ASA Grade Assessment: IV - A patient with severe                        systemic disease that is a constant threat to life.                       - After reviewing the risks and benefits, the patient                        was deemed in satisfactory condition to undergo the                        procedure.                       After obtaining informed consent, the endoscope was                        passed under direct vision. Throughout the procedure,                        the patient's blood pressure, pulse, and oxygen                        saturations were monitored  continuously. The Endoscope                        was introduced through the mouth, and advanced to the                        third part of duodenum. The upper GI endoscopy was                        accomplished without difficulty. The patient tolerated               the procedure well. Findings:      Non-severe esophagitis with no bleeding was found in the entire       esophagus.      Non-occlusive esophageal "B" ring      Localized mildly erythematous mucosa without bleeding was found in the       gastric antrum.      A 1 cm hiatal hernia was present.      The examined duodenum was normal.      The exam was otherwise without abnormality. Impression:           - Non-severe non-erosive esophagitis.                       - Erythematous mucosa in the antrum.                       - 1 cm hiatal hernia.                       - Normal examined duodenum.                       - The examination was otherwise normal.                       - No specimens collected. Recommendation:       - Return patient to hospital ward for ongoing care.                       - Perform a colonoscopy tomorrow.                       - The findings and recommendations were discussed with                        the patient.                       - Clear liquid diet today. Procedure Code(s):    --- Professional ---                       [phone], Esophagogastroduodenoscopy, flexible, transoral;                        diagnostic, including collection of specimen(s) by                        brushing or washing, when performed (separate procedure) Diagnosis Code(s):    --- Professional ---                       K92.1,  Melena (includes Hematochezia)                       D62, Acute posthemorrhagic anemia                       K44.9, Diaphragmatic hernia without obstruction or                        gangrene                       K31.89, Other diseases of stomach and duodenum                       K20.8, Other esophagitis CPT copyright 2017 American [employer] Association. All rights reserved. The codes documented in this report are preliminary and upon coder review may  be revised to meet current compliance requirements. Efrain Sella MD, MD 06/08/2018 12:43:28 PM This  report has been signed electronically. Number of Addenda: 0 Note Initiated On: 06/08/2018 12:04 PM      [facility]

## 2018-06-08 NOTE — Progress Notes (Signed)
ANTICOAGULATION CONSULT NOTE   Pharmacy Consult for Heparin drip Indication: Patient on Xarelto- transition to Heparin drip  Allergies  Allergen Reactions  . Keppra [Levetiracetam] Other (See Comments)    Nausea and dizzy  . Lyrica [Pregabalin] Other (See Comments)    Made patient feel very dizzy and not feel good.     Patient Measurements: Height: 5\' 1"  (154.9 cm) Weight: 102 lb 11 oz (46.6 kg) IBW/kg (Calculated) : 47.8 Heparin Dosing Weight: 46.6 kg  Vital Signs: Temp: 98.7 F (37.1 C) (09/07 1358) Temp Source: Oral (09/07 1358) BP: 125/65 (09/07 1358) Pulse Rate: 90 (09/07 1358)  Labs: Recent Labs    06/07/18 1300 06/07/18 1951 06/08/18 0943 06/08/18 1036  HGB 7.3*  --  10.0*  --   HCT 21.5*  --  28.6*  --   PLT 406  --  354  --   APTT  --  28  --  27  HEPARINUNFRC  --  0.36  --  0.11*  CREATININE 0.61  --   --   --     Estimated Creatinine Clearance: 53 mL/min (by C-G formula based on SCr of 0.61 mg/dL).   Medical History: Past Medical History:  Diagnosis Date  . Cancer of lower lobe of right lung ([place]) 05/07/2015  . COPD (chronic obstructive pulmonary disease) ([place])   . Non-small cell lung cancer ([place])   . Pneumonia 04/2015    Medications:  Scheduled:  . bisacodyl  10 mg Oral Once  . ipratropium-albuterol  3 mL Nebulization Q6H  . pantoprazole (PROTONIX) IV  40 mg Intravenous Q12H  . polyethylene glycol powder  1 Container Oral Once   Infusions:  . sodium chloride 10 mL/hr at 06/08/18 1344  . heparin 800 Units/hr (06/08/18 1339)    Assessment: 62 yo F to transition from Xarelto to Heparin drip. Patient with GI bleed, hx of lung cancer started on Xarelto a month ago for DVT and subacute CVA. Patient on Xarelto PTA with last dose on 06/07/18 in the evening around 2000.  09/06: started at 750 units/hr with no bolus 09/07: heparin drip was stopped for an endoscopy. APTT@ 1036= 27,  HL @ 1036= 0.11 (drawn 1 hour after Heparin drip had been stopped).  Drip increased to 800 units/hr          (2048): aPTT 35 seconds  Goal of Therapy:  APTT 66-102 Heparin level 0.3-0.7 units/ml Monitor platelets by anticoagulation protocol: Yes   Plan: Bolus 3000 units, then increase drip to 950 units/hr Follow-up level in 6 hours   Dallie Piles, PharmD 06/08/2018,4:24 PM

## 2018-06-08 NOTE — Anesthesia Post-op Follow-up Note (Signed)
Anesthesia QCDR form completed.        

## 2018-06-08 NOTE — Anesthesia Preprocedure Evaluation (Signed)
Anesthesia Evaluation  Patient identified by MRN, date of birth, ID band Patient awake    Reviewed: Allergy & Precautions, NPO status , Patient's Chart, lab work & pertinent test results, reviewed documented beta blocker date and time   Airway Mallampati: II  TM Distance: >3 FB     Dental  (+) Chipped, Upper Dentures, Partial Lower   Pulmonary pneumonia, resolved, COPD, Current Smoker,           Cardiovascular      Neuro/Psych PSYCHIATRIC DISORDERS Depression  Neuromuscular disease CVA, No Residual Symptoms    GI/Hepatic   Endo/Other    Renal/GU      Musculoskeletal   Abdominal   Peds  Hematology  (+) anemia ,   Anesthesia Other Findings Lung ca. Smokes. On xarelto. Hb 7.3. IRbbb. EF 60-65.  Reproductive/Obstetrics                             Anesthesia Physical Anesthesia Plan  ASA: III  Anesthesia Plan: General   Post-op Pain Management:    Induction: Intravenous  PONV Risk Score and Plan:   Airway Management Planned:   Additional Equipment:   Intra-op Plan:   Post-operative Plan:   Informed Consent: I have reviewed the patients History and Physical, chart, labs and discussed the procedure including the risks, benefits and alternatives for the proposed anesthesia with the patient or authorized representative who has indicated his/her understanding and acceptance.     Plan Discussed with: CRNA  Anesthesia Plan Comments:         Anesthesia Quick Evaluation

## 2018-06-08 NOTE — Consult Note (Signed)
[name]   [name], M.D.  Reason for Consult: Melena, anemia   Attending Requesting Consult: [name], MD   History of Present Illness: Claudia Chavez is a 62 y.o. female with a history of lung cancer, stage III who presents for anemia found by her hematologist/oncologist.  Hemoglobin has dropped from around from 12 to around 7.3.  Patient noticed intermittent black stools that were solid as well as some complaints of constipation.  There is no complaint of dysphagia or recent weight loss.  Patient does not take NSAIDs on a regular basis.  Denies abdominal pain.  Last colonoscopy was 10 years or more in the past and was unremarkable.  Patient has noticed intermittent melena ever since beginning Xarelto for venous thrombosis 2 months ago.  Past Medical History:  Past Medical History:  Diagnosis Date  . Cancer of lower lobe of right lung ([name]) 05/07/2015  . COPD (chronic obstructive pulmonary disease) ([name])   . Non-small cell lung cancer ([name])   . Pneumonia 04/2015    Problem List: Patient Active Problem List   Diagnosis Date Noted  . Symptomatic anemia 06/07/2018  . GI bleed 06/07/2018  . CVA (cerebral vascular accident) ([place]) 05/24/2018  . Syncope 05/24/2018  . Lung nodule   . Chronic bronchitis ([place]) 10/21/2015  . Cancer of lower lobe of right lung ([name]) 05/07/2015  . Bronchitis, chronic ([place]) 04/28/2015  . Osteoporosis 04/28/2015  . Overactive bladder 04/28/2015  . Situational depression 04/28/2015  . Carpal tunnel syndrome 07/13/2014  . Cervical radiculitis 07/13/2014  . Cervical spinal stenosis 07/13/2014  . Carpal tunnel syndrome of left wrist 07/13/2014    Past Surgical History: Past Surgical History:  Procedure Laterality Date  . ABDOMINAL HYSTERECTOMY    . ELBOW SURGERY Right 1995  . ELECTROMAGNETIC NAVIGATION BROCHOSCOPY N/A 10/25/2017   Procedure: ELECTROMAGNETIC NAVIGATION BRONCHOSCOPY;  Surgeon: [name], MD;   Location: [facility] ORS;  Service: Cardiopulmonary;  Laterality: N/A;  . PORTACATH PLACEMENT Right 05/10/2015   Procedure: INSERTION PORT-A-CATH;  Surgeon: Nestor Lewandowsky, MD;  Location: [facility] ORS;  Service: General;  Laterality: Right;  Marland Kitchen VIDEO ASSISTED THORACOSCOPY (VATS)/THOROCOTOMY Right 08/18/2015   Procedure: VIDEO ASSISTED THORACOSCOPY (VATS)/THOROCOTOMY;  Surgeon: Nestor Lewandowsky, MD;  Location: [facility] ORS;  Service: General;  Laterality: Right;    Allergies: Allergies  Allergen Reactions  . Keppra [Levetiracetam] Other (See Comments)    Nausea and dizzy  . Lyrica [Pregabalin] Other (See Comments)    Made patient feel very dizzy and not feel good.     Home Medications: Medications Prior to Admission  Medication Sig Dispense Refill Last Dose  . albuterol (PROVENTIL HFA;VENTOLIN HFA) 108 (90 Base) MCG/ACT inhaler Inhale 1 puff into the lungs every 6 (six) hours as needed for wheezing or shortness of breath.   PRN at PRN  . Cholecalciferol (VITAMIN D3) 1000 units CAPS Take by mouth 2 (two) times daily.   06/07/2018 at Unknown time  . fluticasone (FLONASE) 50 MCG/ACT nasal spray Place 2 sprays into both nostrils daily.   1 06/07/2018 at Unknown time  . Fluticasone-Salmeterol (ADVAIR DISKUS) 250-50 MCG/DOSE AEPB Inhale 1 puff into the lungs every 12 (twelve) hours.    06/07/2018 at Unknown time  . levETIRAcetam (KEPPRA) 500 MG tablet Take 1 tablet (500 mg total) by mouth 2 (two) times daily. 60 tablet 3 06/07/2018 at Unknown time  . Rivaroxaban 15 & 20 MG TBPK Take as directed on package 51 each 0 06/07/2018 at Unknown time  .  rosuvastatin (CRESTOR) 20 MG tablet Take 1 tablet (20 mg total) by mouth at bedtime. 30 tablet 0 06/06/2018 at Unknown time  . vitamin B-12 (CYANOCOBALAMIN) 1000 MCG tablet Take 1,000 mcg by mouth daily.   06/07/2018 at Unknown time  . ipratropium-albuterol (DUONEB) 0.5-2.5 (3) MG/3ML SOLN TAKE 3 MLS BY NEBULIZATION EVERY 4 (FOUR) HOURS AS NEEDED. 360 mL 3 PRN at PRN   Home medication  reconciliation was completed with the patient.   Scheduled Inpatient Medications:   . Influenza vac split quadrivalent PF  0.5 mL Intramuscular Tomorrow-1000  . pantoprazole (PROTONIX) IV  40 mg Intravenous Q12H  . pneumococcal 23 valent vaccine  0.5 mL Intramuscular Tomorrow-1000    Continuous Inpatient Infusions:   . heparin 750 Units/hr (06/08/18 0324)    PRN Inpatient Medications:  acetaminophen **OR** acetaminophen, ondansetron **OR** ondansetron (ZOFRAN) IV  Family History: family history includes Lung cancer (age of onset: 16) in her father; Stroke in her mother.   GI Family History: Negative  Social History:   reports that she has been smoking cigarettes. She has a 20.00 pack-year smoking history. She has never used smokeless tobacco. She reports that she drinks about 2.0 - 4.0 standard drinks of alcohol per week. She reports that she does not use drugs. The patient denies ETOH, tobacco, or drug use.    Review of Systems: Review of Systems - General ROS: positive for  - fatigue Psychological ROS: negative ENT ROS: negative Allergy and Immunology ROS: negative Endocrine ROS: negative Respiratory ROS: no cough, shortness of breath, or wheezing Cardiovascular ROS: no chest pain or dyspnea on exertion Gastrointestinal ROS: positive for - melena Musculoskeletal ROS: negative Neurological ROS: no TIA or stroke symptoms Dermatological ROS: negative  Physical Examination: BP 118/63 (BP Location: Left Arm)   Pulse 92   Temp 98.5 F (36.9 C) (Oral)   Resp 18   Ht 5\' 1"  (1.549 m)   Wt 46.6 kg   SpO2 97%   BMI 19.40 kg/m  Physical Exam  Constitutional: She is oriented to person, place, and time. She appears well-developed and well-nourished.  HENT:  Head: Normocephalic and atraumatic.  Eyes: Pupils are equal, round, and reactive to light.  Neck: Normal range of motion. Neck supple.  Cardiovascular: Normal rate and regular rhythm.  Pulmonary/Chest: Effort normal and  breath sounds normal.  Abdominal: Soft. Bowel sounds are normal.  Musculoskeletal: Normal range of motion.  Neurological: She is alert and oriented to person, place, and time.  Skin: Skin is warm and dry.  Psychiatric: She has a normal mood and affect. Her behavior is normal.    Data: Lab Results  Component Value Date   WBC 9.8 06/07/2018   HGB 7.3 (L) 06/07/2018   HCT 21.5 (L) 06/07/2018   MCV 98.9 06/07/2018   PLT 406 06/07/2018   Recent Labs  Lab 06/07/18 1300  HGB 7.3*   Lab Results  Component Value Date   NA 135 06/07/2018   K 3.7 06/07/2018   CL 102 06/07/2018   CO2 23 06/07/2018   BUN 11 06/07/2018   CREATININE 0.61 06/07/2018   Lab Results  Component Value Date   ALT 15 06/07/2018   AST 20 06/07/2018   ALKPHOS 133 (H) 06/07/2018   BILITOT 0.4 06/07/2018   Recent Labs  Lab 06/07/18 1951  APTT 28   CBC Latest Ref Rng & Units 06/07/2018 05/24/2018 05/21/2018  WBC 3.6 - 11.0 K/uL 9.8 11.0 9.6  Hemoglobin 12.0 - 16.0 g/dL 7.3(L) 10.5(L) 10.7(L)  Hematocrit 35.0 - 47.0 % 21.5(L) 29.8(L) 30.8(L)  Platelets 150 - 440 K/uL 406 629(H) 563(H)    STUDIES: No results found. @IMAGES @  Assessment:  1.  Melena-likely secondary to Chavez upper GI source.  Cannot rule out a right colonic source.  Cannot rule out malignancy although peptic ulcer disease is more likely.  2.  Stage III non-small cell carcinoma of the lung-status post radiation therapy.  3.  History of pneumonia.  Recommendations: 1.  IV acid suppression, liquids as tolerated. 2.  N.p.o. after midnight.  This is been done. 3.  EGD.The patient understands the nature of the planned procedure, indications, risks, alternatives and potential complications including but not limited to bleeding, infection, perforation, damage to internal organs and possible oversedation/side effects from anesthesia. The patient agrees and gives consent to proceed.  Please refer to procedure notes for findings, recommendations and  patient disposition/instructions.  4.  EGD is negative for source of bleeding, colonoscopy will be performed. 5.  We will follow along with you.  Thank you for the consult. Please call with questions or concerns.  Olean Ree, "Lanny Hurst MD [facility] Gastroenterology [place], [place] [address] [phone]  06/08/2018 3:41 AM

## 2018-06-08 NOTE — Interval H&P Note (Signed)
History and Physical Interval Note:  06/08/2018 12:06 PM  Claudia Chavez  has presented today for surgery, with the diagnosis of NA  The various methods of treatment have been discussed with the patient and family. After consideration of risks, benefits and other options for treatment, the patient has consented to  Procedure(s): ESOPHAGOGASTRODUODENOSCOPY (EGD) (N/A) as a surgical intervention .  The patient's history has been reviewed, patient examined, no change in status, stable for surgery.  I have reviewed the patient's chart and labs.  Questions were answered to the patient's satisfaction.     [place], [place]

## 2018-06-09 ENCOUNTER — Inpatient Hospital Stay: Payer: PPO | Admitting: Anesthesiology

## 2018-06-09 ENCOUNTER — Encounter: Payer: Self-pay | Admitting: *Deleted

## 2018-06-09 ENCOUNTER — Encounter
Admission: AD | Disposition: A | Discharge status: Home / Self Care | Payer: Self-pay | Source: Ambulatory Visit | Attending: Internal Medicine

## 2018-06-09 HISTORY — PX: COLONOSCOPY WITH PROPOFOL: SHX5780

## 2018-06-09 LAB — HEPARIN LEVEL (UNFRACTIONATED): Heparin Unfractionated: <0.1 IU/mL — ABNORMAL LOW (ref 0.30–0.70)

## 2018-06-09 LAB — BPAM RBC
BLOOD PRODUCT EXPIRATION DATE: 201909132359
Blood Product Expiration Date: 201909122359
ISSUE DATE / TIME: 201909070203
ISSUE DATE / TIME: 201909070529
UNIT TYPE AND RH: 600
Unit Type and Rh: 600

## 2018-06-09 LAB — CBC
HEMATOCRIT: 30.2 % — AB (ref 35.0–47.0)
Hemoglobin: 10.6 g/dL — ABNORMAL LOW (ref 12.0–16.0)
MCH: 32.3 pg (ref 26.0–34.0)
MCHC: 35.1 g/dL (ref 32.0–36.0)
MCV: 92.1 fL (ref 80.0–100.0)
Platelets: 346 10*3/uL (ref 150–440)
RBC: 3.28 MIL/uL — AB (ref 3.80–5.20)
RDW: 16.4 % — ABNORMAL HIGH (ref 11.5–14.5)
WBC: 9.5 10*3/uL (ref 3.6–11.0)

## 2018-06-09 LAB — BASIC METABOLIC PANEL
Anion gap: 7 (ref 5–15)
BUN: 7 mg/dL — ABNORMAL LOW (ref 8–23)
CHLORIDE: 105 mmol/L (ref 98–111)
CO2: 24 mmol/L (ref 22–32)
Calcium: 8 mg/dL — ABNORMAL LOW (ref 8.9–10.3)
Creatinine, Ser: 0.53 mg/dL (ref 0.44–1.00)
GFR calc Af Amer: >60 mL/min (ref >60)
GFR calc non Af Amer: >60 mL/min (ref >60)
GLUCOSE: 96 mg/dL (ref 70–99)
POTASSIUM: 3.7 mmol/L (ref 3.5–5.1)
Sodium: 136 mmol/L (ref 135–145)

## 2018-06-09 LAB — TYPE AND SCREEN
ABO/RH(D): A POS
ANTIBODY SCREEN: NEGATIVE
UNIT DIVISION: 0
Unit division: 0

## 2018-06-09 LAB — APTT: aPTT: 26 seconds (ref 24–36)

## 2018-06-09 SURGERY — COLONOSCOPY WITH PROPOFOL
Anesthesia: General

## 2018-06-09 MED ORDER — PROPOFOL 500 MG/50ML IV EMUL
INTRAVENOUS | Status: DC | PRN
  Administered 2018-06-09: 75 ug/kg/min via INTRAVENOUS

## 2018-06-09 MED ORDER — HEPARIN SOD (PORK) LOCK FLUSH 100 UNIT/ML IV SOLN
INTRAVENOUS | Status: AC
  Administered 2018-06-09: 13:00:00
  Filled 2018-06-09: qty 5

## 2018-06-09 MED ORDER — PHENYLEPHRINE HCL 10 MG/ML IJ SOLN
INTRAMUSCULAR | Status: DC | PRN
  Administered 2018-06-09: 100 ug via INTRAVENOUS

## 2018-06-09 MED ORDER — SODIUM CHLORIDE 0.9 % IV SOLN: INTRAVENOUS | Status: DC

## 2018-06-09 MED ORDER — PROPOFOL 500 MG/50ML IV EMUL
INTRAVENOUS | Status: AC
  Filled 2018-06-09: qty 50

## 2018-06-09 MED ORDER — PROPOFOL 10 MG/ML IV BOLUS
INTRAVENOUS | Status: DC | PRN
  Administered 2018-06-09: 20 mg via INTRAVENOUS
  Administered 2018-06-09: 10 mg via INTRAVENOUS
  Administered 2018-06-09: 40 mg via INTRAVENOUS
  Administered 2018-06-09: 30 mg via INTRAVENOUS

## 2018-06-09 MED ORDER — RIVAROXABAN 20 MG PO TABS: 20.0000 mg | ORAL_TABLET | Freq: Every day | ORAL | 1 refills | Status: DC

## 2018-06-09 NOTE — Anesthesia Procedure Notes (Signed)
Date/Time: 06/09/2018 8:02 AM Performed by: Nelda Marseille, CRNA Pre-anesthesia Checklist: Patient identified, Emergency Drugs available, Suction available, Patient being monitored and Timeout performed Oxygen Delivery Method: Nasal cannula

## 2018-06-09 NOTE — Anesthesia Post-op Follow-up Note (Signed)
Anesthesia QCDR form completed.        

## 2018-06-09 NOTE — Anesthesia Postprocedure Evaluation (Signed)
Anesthesia Post Note  Patient: Claudia Chavez  Procedure(s) Performed: COLONOSCOPY WITH PROPOFOL (N/A )  Patient location during evaluation: Endoscopy Anesthesia Type: General Level of consciousness: awake and alert Pain management: pain level controlled Vital Signs Assessment: post-procedure vital signs reviewed and stable Respiratory status: spontaneous breathing, nonlabored ventilation, respiratory function stable and patient connected to nasal cannula oxygen Cardiovascular status: blood pressure returned to baseline and stable Postop Assessment: no apparent nausea or vomiting Anesthetic complications: no     Last Vitals:  Vitals:   06/09/18 0855 06/09/18 0901  BP: (!) 92/48 (!) 96/59  Pulse: 85 83  Resp: (!) 22   Temp:    SpO2: 99% 99%    Last Pain:  Vitals:   06/09/18 0901  TempSrc:   PainSc: 0-No pain                 Evelen Vazguez S

## 2018-06-09 NOTE — Progress Notes (Signed)
Upon arrival to Endo pt. Complaining of soreness and pain in left upper arm. S/p flu and pneumonia shot yesterday in left upper arm per patient. Left upper arm red, slightly swollen and tender. Dr. Alice Reichert notified and will place cool compress.

## 2018-06-09 NOTE — Interval H&P Note (Signed)
History and Physical Interval Note:  06/09/2018 8:04 AM  Claudia Chavez  has presented today for surgery, with the diagnosis of acute post hemorrhagic anemia,melena  The various methods of treatment have been discussed with the patient and family. After consideration of risks, benefits and other options for treatment, the patient has consented to  Procedure(s): COLONOSCOPY WITH PROPOFOL (N/A) as a surgical intervention .  The patient's history has been reviewed, patient examined, no change in status, stable for surgery.  I have reviewed the patient's chart and labs.  Questions were answered to the patient's satisfaction.     [place], [place]

## 2018-06-09 NOTE — Op Note (Signed)
[facility] Patient Name: Claudia Chavez Procedure Date: 06/09/2018 7:57 AM MRN: [id number] Account #: [id number] Date of Birth: 01-03-1956 Admit Type: Outpatient Age: 62 Room: [facility] ENDO ROOM 3 Gender: Female Note Status: Finalized Procedure:            Colonoscopy Indications:          Evaluation of unexplained GI bleeding, Melena Providers:            Benay Pike. [place] MD, MD Medicines:            Propofol per Anesthesia Complications:        No immediate complications. Procedure:            Pre-Anesthesia Assessment:                       - Prior to the procedure, a History and Physical was                        performed, and patient medications, allergies and                        sensitivities were reviewed. The patient's tolerance of                        previous anesthesia was reviewed.                       - The risks and benefits of the procedure and the                        sedation options and risks were discussed with the                        patient. All questions were answered and informed                        consent was obtained.                       - Patient identification and proposed procedure were                        verified prior to the procedure by the nurse. The                        procedure was verified in the procedure room.                       - ASA Grade Assessment: III - A patient with severe                        systemic disease.                       - After reviewing the risks and benefits, the patient                        was deemed in satisfactory condition to undergo the                        procedure.  After obtaining informed consent, the colonoscope was                        passed under direct vision. Throughout the procedure,                        the patient's blood pressure, pulse, and oxygen                        saturations were monitored continuously.  The                        Colonoscope was introduced through the anus and                        advanced to the the cecum, identified by appendiceal                        orifice and ileocecal valve. The colonoscopy was                        performed without difficulty. The patient tolerated the                        procedure well. The quality of the bowel preparation                        was excellent. The ileocecal valve, appendiceal                        orifice, and rectum were photographed. Findings:      The perianal and digital rectal examinations were normal. Pertinent       negatives include normal sphincter tone and no palpable rectal lesions.      Multiple small and large-mouthed diverticula were found in the entire       colon. There was no evidence of diverticular bleeding.      A 5 mm polyp was found in the proximal ascending colon. The polyp was       sessile. The polyp was removed with a cold snare. Resection and       retrieval were complete.      A 4 mm polyp was found in the transverse colon. The polyp was sessile.       The polyp was removed with a jumbo cold forceps. Resection and retrieval       were complete.      A 15 mm polyp was found in the sigmoid colon. The polyp was       semi-pedunculated. The polyp was removed with a piecemeal technique       using a hot snare at 20 watts. Resection and retrieval were complete       using a suction (via the working channel). Two hemostatic clips were       successfully placed (MR conditional).      Non-bleeding internal hemorrhoids were found during retroflexion. The       hemorrhoids were Grade I (internal hemorrhoids that do not prolapse).      The exam was otherwise without abnormality. Impression:           - Moderate diverticulosis in the entire examined colon.  There was no evidence of diverticular bleeding.                       - One 5 mm polyp in the proximal ascending colon,                         removed with a cold snare. Resected and retrieved.                       - One 4 mm polyp in the transverse colon, removed with                        a jumbo cold forceps. Resected and retrieved.                       - One 15 mm polyp in the sigmoid colon, removed                        piecemeal using a hot snare. Resected and retrieved.                        Clips (MR conditional) were placed.                       - Non-bleeding internal hemorrhoids.                       - The examination was otherwise normal. Recommendation:       - Return patient to hospital ward for possible                        discharge same day.                       - If re-bleeding occurs, will require small bowel                        enteroscopy +/- VCE.                       - At this time, I presume melena originated from                        right-sided colonic diverticuli.                       - Resume regular diet today.                       - The findings and recommendations were discussed with                        the patient. Procedure Code(s):    --- Professional ---                       [phone], Colonoscopy, flexible; with removal of tumor(s),                        polyp(s), or other lesion(s) by snare technique  01749, 105, Colonoscopy, flexible; with biopsy, single                        or multiple Diagnosis Code(s):    --- Professional ---                       K57.30, Diverticulosis of large intestine without                        perforation or abscess without bleeding                       K92.1, Melena (includes Hematochezia)                       K92.2, Gastrointestinal hemorrhage, unspecified                       D12.5, Benign neoplasm of sigmoid colon                       D12.2, Benign neoplasm of ascending colon                       D12.3, Benign neoplasm of transverse colon (hepatic                        flexure or splenic  flexure)                       K64.0, First degree hemorrhoids CPT copyright 2017 American Medical Association. All rights reserved. The codes documented in this report are preliminary and upon coder review may  be revised to meet current compliance requirements. Efrain Sella MD, MD 06/09/2018 8:46:19 AM This report has been signed electronically. Number of Addenda: 0 Note Initiated On: 06/09/2018 7:57 AM Scope Withdrawal Time: 0 hours 15 minutes 45 seconds  Total Procedure Duration: 0 hours 23 minutes 38 seconds       [facility]

## 2018-06-09 NOTE — Discharge Summary (Signed)
[place] [facility] [place] NAME: Claudia Chavez    MR#:  [id number]  DATE OF BIRTH:  06/22/1956  DATE OF ADMISSION:  06/07/2018   ADMITTING PHYSICIAN: Fritzi Mandes, MD  DATE OF DISCHARGE:  06/09/18  PRIMARY CARE PHYSICIAN: [place] Pink, MD   ADMISSION DIAGNOSIS:   GI Bleed, Lung ca  DISCHARGE DIAGNOSIS:   Active Problems:   GI bleed   SECONDARY DIAGNOSIS:   Past Medical History:  Diagnosis Date  . Cancer of lower lobe of right lung ([place]) 05/07/2015  . COPD (chronic obstructive pulmonary disease) ([place])   . Non-small cell lung cancer ([place])   . Pneumonia 04/2015    HOSPITAL COURSE:   62 year old female with past medical history significant for stage III left lung cancer which is stable status post chemoradiation and following at the cancer center, recent diagnosis of DVT on Xarelto, history of mini strokes, presents to hospital secondary to weakness and dark tarry stools.  1.  Melena- GI bleed.  Hemoglobin drop noted from 10.5 to 7.3 on admission. -Received 2 units packed RBC transfusion and hemoglobin is stable at 10 -Received IV Protonix.  Appreciate GI consult -Xarelto on hold. -EGD with nonbleeding esophagitis noted.  Can take over-the-counter Prilosec as outpatient. -Colonoscopy done showing moderate diverticulosis with no evidence of bleeding and nonbleeding internal hemorrhoids and one sigmoid colon polyp noted. -Outpatient capsule endoscopy study recommended at this time.  Resume Xarelto at discharge and monitor hemoglobin closely.  2.  Recent history of left lower extremity DVT-patient was bridged with IV heparin while in the hospital for her procedures.  Xarelto is being restarted at discharge.  Monitor CBC as outpatient closely.  If recurrent bleeding happens, consider IVC filter placement.  3.  History of lung cancer-follows up with cancer center.  Status post chemoradiation, currently being monitored.  4.  Recent history  of stroke-MRI brain done last month showing white matter changes concerning for subacute infarcts versus a cystic lesion.  -Xarelto for anticoagulation -Was started on Keppra, however patient stopped taking it as outpatient due to significant side effects. -Outpatient neurology follow-up  Up and ambulating well.  Will be discharged home today  DISCHARGE CONDITIONS:   Guarded  CONSULTS OBTAINED:   Treatment Team:  Efrain Sella, MD  DRUG ALLERGIES:   Allergies  Allergen Reactions  . Keppra [Levetiracetam] Other (See Comments)    Nausea and dizzy  . Lyrica [Pregabalin] Other (See Comments)    Made patient feel very dizzy and not feel good.    DISCHARGE MEDICATIONS:   Allergies as of 06/09/2018      Reactions   Keppra [levetiracetam] Other (See Comments)   Nausea and dizzy   Lyrica [pregabalin] Other (See Comments)   Made patient feel very dizzy and not feel good.       Medication List    STOP taking these medications   Rivaroxaban 15 & 20 MG Tbpk Replaced by:  rivaroxaban 20 MG Tabs tablet     TAKE these medications   ADVAIR DISKUS 250-50 MCG/DOSE Aepb Generic drug:  Fluticasone-Salmeterol Inhale 1 puff into the lungs every 12 (twelve) hours.   albuterol 108 (90 Base) MCG/ACT inhaler Commonly known as:  PROVENTIL HFA;VENTOLIN HFA Inhale 1 puff into the lungs every 6 (six) hours as needed for wheezing or shortness of breath.   fluticasone 50 MCG/ACT nasal spray Commonly known as:  FLONASE Place 2 sprays into both nostrils daily.   ipratropium-albuterol 0.5-2.5 (3) MG/3ML  Soln Commonly known as:  DUONEB TAKE 3 MLS BY NEBULIZATION EVERY 4 (FOUR) HOURS AS NEEDED.   levETIRAcetam 500 MG tablet Commonly known as:  KEPPRA Take 1 tablet (500 mg total) by mouth 2 (two) times daily.   rivaroxaban 20 MG Tabs tablet Commonly known as:  XARELTO Take 1 tablet (20 mg total) by mouth daily with supper. Replaces:  Rivaroxaban 15 & 20 MG Tbpk   rosuvastatin 20 MG  tablet Commonly known as:  CRESTOR Take 1 tablet (20 mg total) by mouth at bedtime.   vitamin B-12 1000 MCG tablet Commonly known as:  CYANOCOBALAMIN Take 1,000 mcg by mouth daily.   Vitamin D3 1000 units Caps Take by mouth 2 (two) times daily.        DISCHARGE INSTRUCTIONS:   1. PCP f/u in 1-2 weeks 2. GI f/u in 1 week  DIET:   Cardiac diet  ACTIVITY:   Activity as tolerated  OXYGEN:   Home Oxygen: No.  Oxygen Delivery: room air  DISCHARGE LOCATION:   home   If you experience worsening of your admission symptoms, develop shortness of breath, life threatening emergency, suicidal or homicidal thoughts you must seek medical attention immediately by calling 911 or calling your MD immediately  if symptoms less severe.  You Must read complete instructions/literature along with all the possible adverse reactions/side effects for all the Medicines you take and that have been prescribed to you. Take any new Medicines after you have completely understood and accpet all the possible adverse reactions/side effects.   Please note  You were cared for by a hospitalist during your hospital stay. If you have any questions about your discharge medications or the care you received while you were in the hospital after you are discharged, you can call the unit and asked to speak with the hospitalist on call if the hospitalist that took care of you is not available. Once you are discharged, your primary care physician will handle any further medical issues. Please note that NO REFILLS for any discharge medications will be authorized once you are discharged, as it is imperative that you return to your primary care physician (or establish a relationship with a primary care physician if you do not have one) for your aftercare needs so that they can reassess your need for medications and monitor your lab values.    On the day of Discharge:  VITAL SIGNS:   Blood pressure (!) 96/50, pulse 83,  temperature (!) 97 F (36.1 C), temperature source Tympanic, resp. rate (!) 22, height 5\' 1"  (1.549 m), weight 46.6 kg, SpO2 100 %.  PHYSICAL EXAMINATION:   GENERAL:  62 y.o.-year-old patient lying in the bed with no acute distress.  EYES: Pupils equal, round, reactive to light and accommodation. No scleral icterus. Extraocular muscles intact.  Pale conjunctiva noted HEENT: Head atraumatic, normocephalic. Oropharynx and nasopharynx clear.  NECK:  Supple, no jugular venous distention. No thyroid enlargement, no tenderness.  LUNGS: Normal breath sounds bilaterally, no wheezing, rales,rhonchi or crepitation. No use of accessory muscles of respiration.  CARDIOVASCULAR: S1, S2 normal. No rubs, or gallops. 2/6 systolic murmur is present ABDOMEN: Soft, nontender, nondistended. Bowel sounds present. No organomegaly or mass.  EXTREMITIES: No pedal edema, cyanosis, or clubbing.  NEUROLOGIC: Cranial nerves II through XII are intact. Muscle strength 5/5 in all extremities. Sensation intact. Gait not checked.  PSYCHIATRIC: The patient is alert and oriented x 3.  SKIN: No obvious rash, lesion, or ulcer.   DATA REVIEW:  CBC Recent Labs  Lab 06/09/18 0310  WBC 9.5  HGB 10.6*  HCT 30.2*  PLT 346    Chemistries  Recent Labs  Lab 06/07/18 1300 06/09/18 0310  NA 135 136  K 3.7 3.7  CL 102 105  CO2 23 24  GLUCOSE 110* 96  BUN 11 7*  CREATININE 0.61 0.53  CALCIUM 8.0* 8.0*  MG 1.7  --   AST 20  --   ALT 15  --   ALKPHOS 133*  --   BILITOT 0.4  --      Microbiology Results  Results for orders placed or performed during the hospital encounter of 08/18/15  MRSA PCR Screening     Status: None   Collection Time: 08/18/15  8:00 PM  Result Value Ref Range Status   MRSA by PCR NEGATIVE NEGATIVE Final    Comment:        The GeneXpert MRSA Assay (FDA approved for NASAL specimens only), is one component of a comprehensive MRSA colonization surveillance program. It is not intended to  diagnose MRSA infection nor to guide or monitor treatment for MRSA infections.     RADIOLOGY:  No results found.   Management plans discussed with the patient, family and they are in agreement.  CODE STATUS:     Code Status Orders  (From admission, onward)         Start     Ordered   06/07/18 1729  Full code  Continuous     06/07/18 1728        Code Status History    Date Active Date Inactive Code Status Order ID Comments User Context   05/24/2018 1141 05/25/2018 1651 Full Code [id number]  Gorden Harms, MD Inpatient   08/18/2015 2014 08/23/2015 2219 Full Code [id number]  Nestor Lewandowsky, MD Inpatient    Advance Directive Documentation     Most Recent Value  Type of Advance Directive  Living will, Healthcare Power of Attorney  Pre-existing out of facility DNR order (yellow form or pink MOST form)  -  "MOST" Form in Place?  -      TOTAL TIME TAKING CARE OF THIS PATIENT: 38 minutes.    Gladstone Lighter M.D on 06/09/2018 at 1:24 PM  Between 7am to 6pm - Pager - [phone]  After 6pm go to [email or web address] - Proofreader  [employer] [facility]  Office  [phone]  CC: Primary care physician; [place] Pink, MD   Note: This dictation was prepared with Dragon dictation along with smaller phrase technology. Any transcriptional errors that result from this process are unintentional.

## 2018-06-09 NOTE — Progress Notes (Signed)
Pt's ride present for discharge; pt discharged via wheelchair by auxillary to the visitor's entrance

## 2018-06-09 NOTE — Plan of Care (Signed)

## 2018-06-09 NOTE — Transfer of Care (Signed)
Immediate Anesthesia Transfer of Care Note  Patient: Claudia Chavez  Procedure(s) Performed: COLONOSCOPY WITH PROPOFOL (N/A )  Patient Location: PACU  Anesthesia Type:General  Level of Consciousness: sedated  Airway & Oxygen Therapy: Patient Spontanous Breathing and Patient connected to nasal cannula oxygen  Post-op Assessment: Report given to RN and Post -op Vital signs reviewed and stable  Post vital signs: Reviewed and stable  Last Vitals:  Vitals Value Taken Time  BP    Temp    Pulse    Resp    SpO2      Last Pain:  Vitals:   06/09/18 0750  TempSrc: Tympanic  PainSc:          Complications: No apparent anesthesia complications

## 2018-06-09 NOTE — Progress Notes (Signed)
MD order received to discharge pt home today in [facility]; verbally reviewed AVS with pt, Rx electronically submitted to [facility] on [address] in [place], [place]; no questions voiced at this time; pt's discharge pending arrival of her ride home; verbally instructed pt to use her nurse callbell in order to get a wheelchair for discharge

## 2018-06-09 NOTE — Anesthesia Preprocedure Evaluation (Signed)
Anesthesia Evaluation  Patient identified by MRN, date of birth, ID band Patient awake    Reviewed: Allergy & Precautions, NPO status , Patient's Chart, lab work & pertinent test results, reviewed documented beta blocker date and time   Airway Mallampati: II  TM Distance: >3 FB     Dental  (+) Chipped, Upper Dentures, Partial Lower   Pulmonary pneumonia, resolved, COPD, Current Smoker,           Cardiovascular      Neuro/Psych PSYCHIATRIC DISORDERS Depression  Neuromuscular disease CVA, No Residual Symptoms    GI/Hepatic   Endo/Other    Renal/GU      Musculoskeletal   Abdominal   Peds  Hematology  (+) anemia ,   Anesthesia Other Findings Smokes. IRbbb. EF 60-65. Lung ca.  Reproductive/Obstetrics                             Anesthesia Physical Anesthesia Plan  ASA: III  Anesthesia Plan: General   Post-op Pain Management:    Induction: Intravenous  PONV Risk Score and Plan:   Airway Management Planned:   Additional Equipment:   Intra-op Plan:   Post-operative Plan:   Informed Consent: I have reviewed the patients History and Physical, chart, labs and discussed the procedure including the risks, benefits and alternatives for the proposed anesthesia with the patient or authorized representative who has indicated his/her understanding and acceptance.     Plan Discussed with: CRNA  Anesthesia Plan Comments:         Anesthesia Quick Evaluation

## 2018-06-10 ENCOUNTER — Encounter: Payer: Self-pay | Admitting: Internal Medicine

## 2018-06-10 NOTE — Progress Notes (Signed)
Cardiology Office Note  Date:  06/11/2018   ID:  Claudia Chavez, DOB 06/27/1956, MRN [id number]  PCP:  Maryland Pink, MD   Chief Complaint  Patient presents with  . Other    C/o syncopal spells and rapid heart beat denies cp and heaviness. Meds reviewed verbally with pt.    HPI:  Claudia Chavez is a 62 year old woman with past medical history of Syncope GI bleed , lung CA, stage III , COPD status post chemoradiation DVT on Xarelto,  mini strokes, document  on MRI  Admitted to hospital secondary to weakness and dark tarry stools Referred by Anderson Malta burns for consultation of her syncope and near syncope  Recent hospitalization and imaging studies reviewed with her in detail  MRI: Lobulated multi-cystic lesion in the left posterior frontal subcortical white matter measuring up to 13 mm. No reduced diffusion, blood products, or enhancement. Given additional small infarcts in the brain, subacute infarction is suspected. Some differential considerations include cystic CNS Small chronic infarcts in the bilateral cerebellar hemispheres.  Hospitalization for  melena- GI bleed. Hemoglobin drop noted from 10.5 to7.3 on admission. She received transfusion x2 EGD with nonbleeding esophagitis noted.   Colonoscopy done showing moderate diverticulosis with no evidence of bleeding ,  nonbleeding internal hemorrhoids   Recent left lower extremity DVT Xarelto restarted at discharge.   It was felt if she had recurrent bleeding that IVC filter would be placed  stroke-MRI brain  showing white matter changes concerning for subacute infarcts, cystic lesion/metastasis  Keppra was initiated but stopped secondary to side effects  Blood pressure dropping/running low at home Orthostatics positive on today's visit in the office 96/61 supine,  Rate 98 94/60, rate 106, sitting 88/62, rate 112 standing  She reports having recent weight loss  Recent episodes of syncope discussed with her 4  episodes of syncope: 1 episode july 18th, up on ladder in the heat, lightheaded, near-syncope 2nd time, 2 weeks later in kitchen, had been baking, got dizzy, sat on the floor,  Near syncope 3rd, coming up back steps, near syncope Sitting and drinking tea had episode of near syncope 4rth time Friday evening, end of Aug, heading into bathroom, syncope, family caught her  EKG personally reviewed by myself on todays visit Shows sinus tachycardia rate 104 bpm no significant ST or T wave changes   PMH:   has a past medical history of Cancer of lower lobe of right lung ([place]) (05/07/2015), COPD (chronic obstructive pulmonary disease) ([place]), Non-small cell lung cancer ([place]), and Pneumonia (04/2015).  PSH:    Past Surgical History:  Procedure Laterality Date  . ABDOMINAL HYSTERECTOMY    . COLONOSCOPY WITH PROPOFOL N/A 06/09/2018   Procedure: COLONOSCOPY WITH PROPOFOL;  Surgeon: Toledo, Benay Pike, MD;  Location: [facility] ENDOSCOPY;  Service: Gastroenterology;  Laterality: N/A;  . ELBOW SURGERY Right 1995  . ELECTROMAGNETIC NAVIGATION BROCHOSCOPY N/A 10/25/2017   Procedure: ELECTROMAGNETIC NAVIGATION BRONCHOSCOPY;  Surgeon: [name], MD;  Location: [facility] ORS;  Service: Cardiopulmonary;  Laterality: N/A;  . ESOPHAGOGASTRODUODENOSCOPY N/A 06/08/2018   Procedure: ESOPHAGOGASTRODUODENOSCOPY (EGD);  Surgeon: Toledo, Benay Pike, MD;  Location: [facility] ENDOSCOPY;  Service: Gastroenterology;  Laterality: N/A;  . PORTACATH PLACEMENT Right 05/10/2015   Procedure: INSERTION PORT-A-CATH;  Surgeon: Nestor Lewandowsky, MD;  Location: [facility] ORS;  Service: General;  Laterality: Right;  Marland Kitchen VIDEO ASSISTED THORACOSCOPY (VATS)/THOROCOTOMY Right 08/18/2015   Procedure: VIDEO ASSISTED THORACOSCOPY (VATS)/THOROCOTOMY;  Surgeon: Nestor Lewandowsky, MD;  Location: [facility] ORS;  Service: General;  Laterality: Right;  Current Outpatient Medications  Medication Sig Dispense Refill  . albuterol (PROVENTIL HFA;VENTOLIN HFA) 108 (90 Base) MCG/ACT inhaler  Inhale 1 puff into the lungs every 6 (six) hours as needed for wheezing or shortness of breath.    . Cholecalciferol (VITAMIN D3) 1000 units CAPS Take by mouth 2 (two) times daily.    . Fluticasone-Salmeterol (ADVAIR DISKUS) 250-50 MCG/DOSE AEPB Inhale 1 puff into the lungs every 12 (twelve) hours.     Marland Kitchen ipratropium-albuterol (DUONEB) 0.5-2.5 (3) MG/3ML SOLN TAKE 3 MLS BY NEBULIZATION EVERY 4 (FOUR) HOURS AS NEEDED. 360 mL 3  . rivaroxaban (XARELTO) 20 MG TABS tablet Take 1 tablet (20 mg total) by mouth daily with supper. 30 tablet 1  . rosuvastatin (CRESTOR) 20 MG tablet Take 1 tablet (20 mg total) by mouth at bedtime. 30 tablet 0  . vitamin B-12 (CYANOCOBALAMIN) 1000 MCG tablet Take 1,000 mcg by mouth daily.    . fludrocortisone (FLORINEF) 0.1 MG tablet Take 1 tablet (0.1 mg total) by mouth daily. 90 tablet 3  . midodrine (PROAMATINE) 10 MG tablet Take 1 tablet (10 mg total) by mouth 3 (three) times daily as needed (As needed for Systolic less than 867). 270 tablet 3   No current facility-administered medications for this visit.    Facility-Administered Medications Ordered in Other Visits  Medication Dose Route Frequency Provider Last Rate Last Dose  . sodium chloride flush (NS) 0.9 % injection 10 mL  10 mL Intravenous PRN Cammie Sickle, MD         Allergies:   Keppra [levetiracetam] and Lyrica [pregabalin]   Social History:  The patient  reports that she has been smoking cigarettes. She has a 20.00 pack-year smoking history. She has never used smokeless tobacco. She reports that she drank about 2.0 - 4.0 standard drinks of alcohol per week. She reports that she does not use drugs.   Family History:   family history includes Lung cancer (age of onset: 51) in her father; Stroke in her mother.    Review of Systems: Review of Systems  Constitutional: Positive for weight loss.  Respiratory: Negative.   Cardiovascular: Negative.   Gastrointestinal: Negative.   Musculoskeletal:  Negative.   Neurological: Positive for loss of consciousness.  Psychiatric/Behavioral: Negative.   All other systems reviewed and are negative.    PHYSICAL EXAM: VS:  BP 103/65 (BP Location: Right Arm, Patient Position: Sitting, Cuff Size: Normal)   Pulse (!) 104   Ht 5\' 1"  (1.549 m)   Wt 99 lb 4 oz (45 kg)   BMI 18.75 kg/m  , BMI Body mass index is 18.75 kg/m. GEN: Very thin , in no acute distress  HEENT: normal  Neck: no JVD, carotid bruits, or masses Cardiac: RRR; no murmurs, rubs, or gallops,no edema  Respiratory:  clear to auscultation bilaterally, normal work of breathing GI: soft, nontender, nondistended, + BS MS: no deformity or atrophy  Skin: warm and dry, no rash Neuro:  Strength and sensation are intact Psych: euthymic mood, full affect   Recent Labs: 06/07/2018: ALT 15; Magnesium 1.7 06/09/2018: BUN 7; Creatinine, Ser 0.53; Hemoglobin 10.6; Platelets 346; Potassium 3.7; Sodium 136    Lipid Panel Lab Results  Component Value Date   CHOL 134 05/25/2018   HDL 56 05/25/2018   LDLCALC 69 05/25/2018   TRIG 43 05/25/2018      Wt Readings from Last 3 Encounters:  06/11/18 99 lb 4 oz (45 kg)  06/08/18 102 lb 11 oz (46.6 kg)  05/24/18 104 lb 4.8 oz (47.3 kg)       ASSESSMENT AND PLAN:  Syncope, unspecified syncope type - Plan: EKG 12-Lead Recent episodes likely secondary to orthostasis She is very thin and with recent weight loss likely contributing to hypotension Orthostatic numbers in the office today Strongly recommended aggressive fluid hydration, salt loading, could try compression hose with back brace/abdominal binder Given numerous episodes of near syncope with several syncope episodes we have recommended she start fludrocortisone 0.1 mg daily -We have also prescribed midodrine 10 mg for her to take as needed if she is orthostatic at home.  She can take this up to 3 times a day as needed -Recommend she try to get her weight up  Orthostasis Plan as  above.  Likely brought on by weight loss  Malignant neoplasm of lung, unspecified laterality, unspecified part of lung ([place]) Managed by oncology Reports that she is not undergoing any active treatment Cancer on the right lower lobe Had 3 cycles of chemotherapy then resection of lung mass Started on carboplatinum and Taxol Radiation.  Chemo therapy put on hold secondary to progressive neuropathy  Cerebrovascular accident (CVA), unspecified mechanism ([place]) Seen on MRI scan, etiology unclear On Xarelto  Chronic deep vein thrombosis (DVT) of distal vein of lower extremity, unspecified laterality ([place]) On xarelto  Gastrointestinal hemorrhage, unspecified gastrointestinal hemorrhage type Recent hospitalization for GI bleed, EGD and colonoscopy Currently is not having any active bleeding On xarelto even prior history of DVT  Disposition:   F/U  1-2 months  Patient was seen in consultation for Faythe Casa and will be referred back to her office for ongoing care of the issues detailed above   Total encounter time more than 60 minutes  Greater than 50% was spent in counseling and coordination of care with the patient    Orders Placed This Encounter  Procedures  . EKG 12-Lead     Signed, Esmond Plants, M.D., Ph.D. 06/11/2018  [place], [place]

## 2018-06-11 ENCOUNTER — Encounter: Payer: Self-pay | Admitting: Cardiovascular Disease

## 2018-06-11 ENCOUNTER — Ambulatory Visit: Payer: PPO | Admitting: Cardiovascular Disease

## 2018-06-11 ENCOUNTER — Other Ambulatory Visit: Payer: Self-pay | Admitting: *Deleted

## 2018-06-11 VITALS — BP 103/65 | HR 104 | Ht 61.0 in | Wt 99.2 lb

## 2018-06-11 DIAGNOSIS — I825Z9 Chronic embolism and thrombosis of unspecified deep veins of unspecified distal lower extremity: Secondary | ICD-10-CM

## 2018-06-11 DIAGNOSIS — I639 Cerebral infarction, unspecified: Secondary | ICD-10-CM

## 2018-06-11 DIAGNOSIS — R55 Syncope and collapse: Secondary | ICD-10-CM

## 2018-06-11 DIAGNOSIS — K922 Gastrointestinal hemorrhage, unspecified: Secondary | ICD-10-CM

## 2018-06-11 DIAGNOSIS — I951 Orthostatic hypotension: Secondary | ICD-10-CM

## 2018-06-11 DIAGNOSIS — C3490 Malignant neoplasm of unspecified part of unspecified bronchus or lung: Secondary | ICD-10-CM | POA: Diagnosis not present

## 2018-06-11 DIAGNOSIS — C3431 Malignant neoplasm of lower lobe, right bronchus or lung: Secondary | ICD-10-CM

## 2018-06-11 LAB — SURGICAL PATHOLOGY

## 2018-06-11 MED ORDER — MIDODRINE HCL 10 MG PO TABS: 10.0000 mg | ORAL_TABLET | Freq: Three times a day (TID) | ORAL | 3 refills | Status: DC | PRN

## 2018-06-11 MED ORDER — FLUDROCORTISONE ACETATE 0.1 MG PO TABS: 0.1000 mg | ORAL_TABLET | Freq: Every day | ORAL | 3 refills | Status: DC

## 2018-06-11 NOTE — Progress Notes (Signed)
Spoke with Dr. Rogue Bussing - labs to be drawn on 9/12 cbc/metb. Will keep all other apts as scheduled.

## 2018-06-11 NOTE — Patient Instructions (Addendum)
Drink fluids, Salt Weight gain  Medication Instructions:   Please take florinef daily for blood pressure  Check your pressures when standing Take midodrine one pill for pressure <100 on the top  Midodrine lasts for 6 hours  Labwork:  No new labs needed  Testing/Procedures:  No further testing at this time   Follow-Up: It was a pleasure seeing you in the office today. Please call us if you have new issues that need to be addressed before your next appt.  [phone]  Your physician wants you to follow-up in:  Follow up in 1 to 2 months  If you need a refill on your cardiac medications before your next appointment, please call your pharmacy.  For educational health videos Log in to : [email or web address] Or : [email or web address], password : [email or web address]

## 2018-06-12 ENCOUNTER — Other Ambulatory Visit: Payer: Self-pay | Admitting: *Deleted

## 2018-06-12 NOTE — Patient Outreach (Signed)
Claudia Chavez) Care Management  06/12/2018  Claudia Chavez 10/15/1955 [id number]   Claudia [facility]avez- general discharge from [facility] (9/6-06/09/18 for GI bleed, lung CA)  RED ON Claudia [facility]avez ALERT Day # 1 Date:   Tuesday 06/11/18 1311 Red Alert Reason: scheduled follow up? Doesn't apply New prescriptions? I don't know   Outreach attempt # 1 successful at her home  Patient is able to verify HIPAA [facility] Care Management RN reviewed and addressed red alert with patient Claudia Chavez confirmed her answers to the Claudia [facility]avez  Questions were incorrect She has not scheduled follow up appointments but will be doing so today to Dr Kary Kos and Dr Alice Reichert CM discussed the importance of follow up appointments and her 06/11/18 new patient appointment to her cardiologist, Dr Rockey Situ She reports her Rx for xarelto given from the [facility] was one she had already been taking and had filled at home   Social: Claudia Chavez is widowed, previously worked for [employer] and lives alone with frequent visits from her sister and other family members She reports she is independent in all her care She reports her family provides transportation and presently she is able to drive herself to and from medical appointments and to run errands  She has had 2 admissions in the last 6 months   Conditions: Syncope, GI bleed, past hx of lower lobe of right non small cell lung CA, stage III (05/06/25), COPD status post chemoradiation, CVA, DVT on Xarelto, mini strokes, document  on MRI, pneumonia, cigarette smoker, carpal tunnel syndrome  Medications: Claudia Chavez confirms she was on xarelto prior to her admission and after discharge the xarelto rx was changed, She did not have to refill the rx because she had it at home.  She denies concern with cost of xarelto and other medications at this time but will contact [facility] staff prn   Appointments:  She has not scheduled follow up appointments but will be doing so today to Dr Kary Kos and Dr Alice Reichert CM discussed  the importance of follow up appointments and her 06/11/18 new patient appointment to her cardiologist, Dr Rockey Situ  Advance Directives: she does have a living will and POA and is not interested in making changes   Consent: [facility] RN CM reviewed [facility] services with patient. She denies need of services from [facility]/Telephonic RN CM, pharmacy, health coach, NP or SW at this time   Advised patient that there will be further automated Claudia [facility]- post discharge calls to assess how the patient is doing following the recent hospitalization Advised the patient that another call may be received from a nurse if any of their responses were abnormal. Patient voiced understanding and was appreciative of f/u call.  flu and pneumovax shots given during her hospitalization  Transition of care services noted to be completed in EPIC by primary care MD office staff  Plan: [facility] RN CM sent a successful outreach letter as discussed with [facility] brochure enclosed for review  [facility] RN CM will close case at this time as patient has been assessed and no needs identified.    Serge Main L. Lavina Hamman, RN, BSN, [place] Coordinator Office number [phone] Mobile number [phone]  Main [facility] number [phone] Fax number [phone]

## 2018-06-13 ENCOUNTER — Inpatient Hospital Stay: Payer: PPO

## 2018-06-13 DIAGNOSIS — D649 Anemia, unspecified: Secondary | ICD-10-CM | POA: Diagnosis not present

## 2018-06-13 DIAGNOSIS — Z86718 Personal history of other venous thrombosis and embolism: Secondary | ICD-10-CM | POA: Diagnosis not present

## 2018-06-13 DIAGNOSIS — C3431 Malignant neoplasm of lower lobe, right bronchus or lung: Secondary | ICD-10-CM

## 2018-06-13 DIAGNOSIS — K922 Gastrointestinal hemorrhage, unspecified: Secondary | ICD-10-CM | POA: Diagnosis not present

## 2018-06-13 DIAGNOSIS — C3492 Malignant neoplasm of unspecified part of left bronchus or lung: Secondary | ICD-10-CM | POA: Diagnosis not present

## 2018-06-13 DIAGNOSIS — Z720 Tobacco use: Secondary | ICD-10-CM | POA: Diagnosis not present

## 2018-06-13 DIAGNOSIS — R531 Weakness: Secondary | ICD-10-CM | POA: Diagnosis not present

## 2018-06-13 DIAGNOSIS — Z7901 Long term (current) use of anticoagulants: Secondary | ICD-10-CM | POA: Diagnosis not present

## 2018-06-13 DIAGNOSIS — E876 Hypokalemia: Secondary | ICD-10-CM | POA: Diagnosis not present

## 2018-06-13 DIAGNOSIS — I639 Cerebral infarction, unspecified: Secondary | ICD-10-CM | POA: Diagnosis not present

## 2018-06-13 LAB — CBC WITH DIFFERENTIAL/PLATELET
BASOS PCT: 1 %
Basophils Absolute: 0.1 10*3/uL (ref 0–0.1)
EOS ABS: 0.2 10*3/uL (ref 0–0.7)
Eosinophils Relative: 3 %
HCT: 29.4 % — ABNORMAL LOW (ref 35.0–47.0)
HEMOGLOBIN: 10 g/dL — AB (ref 12.0–16.0)
Lymphocytes Relative: 14 %
Lymphs Abs: 1 10*3/uL (ref 1.0–3.6)
MCH: 32.1 pg (ref 26.0–34.0)
MCHC: 34 g/dL (ref 32.0–36.0)
MCV: 94.6 fL (ref 80.0–100.0)
MONOS PCT: 7 %
Monocytes Absolute: 0.5 10*3/uL (ref 0.2–0.9)
NEUTROS PCT: 75 %
Neutro Abs: 5.6 10*3/uL (ref 1.4–6.5)
Platelets: 499 10*3/uL — ABNORMAL HIGH (ref 150–440)
RBC: 3.11 MIL/uL — AB (ref 3.80–5.20)
RDW: 15.2 % — ABNORMAL HIGH (ref 11.5–14.5)
WBC: 7.4 10*3/uL (ref 3.6–11.0)

## 2018-06-13 LAB — BASIC METABOLIC PANEL
Anion gap: 7 (ref 5–15)
BUN: 8 mg/dL (ref 8–23)
CALCIUM: 8.3 mg/dL — AB (ref 8.9–10.3)
CO2: 25 mmol/L (ref 22–32)
CREATININE: 0.61 mg/dL (ref 0.44–1.00)
Chloride: 102 mmol/L (ref 98–111)
GFR calc non Af Amer: >60 mL/min (ref >60)
Glucose, Bld: 146 mg/dL — ABNORMAL HIGH (ref 70–99)
Potassium: 2.9 mmol/L — ABNORMAL LOW (ref 3.5–5.1)
SODIUM: 134 mmol/L — AB (ref 135–145)

## 2018-06-13 LAB — SAMPLE TO BLOOD BANK

## 2018-06-17 ENCOUNTER — Telehealth: Payer: Self-pay | Admitting: Internal Medicine

## 2018-06-17 MED ORDER — POTASSIUM CHLORIDE CRYS ER 20 MEQ PO TBCR: EXTENDED_RELEASE_TABLET | ORAL | 3 refills | Status: DC

## 2018-06-17 NOTE — Telephone Encounter (Signed)
x

## 2018-06-17 NOTE — Telephone Encounter (Signed)
Claudia Chavez/brooke- Please inform pt that her Potassium is low at 2.9; recommend kdur 20 BID; follow up as planned; please add cbc/bmp at that visit. I sent new script to her pharmacy. Thx

## 2018-06-17 NOTE — Telephone Encounter (Signed)
Patient has been notified of Dr. Aletha Halim comments and recommendations and verbalized understanding.

## 2018-06-18 DIAGNOSIS — R5383 Other fatigue: Secondary | ICD-10-CM | POA: Diagnosis not present

## 2018-06-18 DIAGNOSIS — M25475 Effusion, left foot: Secondary | ICD-10-CM | POA: Diagnosis not present

## 2018-06-18 DIAGNOSIS — R5381 Other malaise: Secondary | ICD-10-CM | POA: Diagnosis not present

## 2018-06-18 DIAGNOSIS — D649 Anemia, unspecified: Secondary | ICD-10-CM | POA: Diagnosis not present

## 2018-06-18 DIAGNOSIS — I825Y2 Chronic embolism and thrombosis of unspecified deep veins of left proximal lower extremity: Secondary | ICD-10-CM | POA: Diagnosis not present

## 2018-06-18 DIAGNOSIS — F329 Major depressive disorder, single episode, unspecified: Secondary | ICD-10-CM | POA: Diagnosis not present

## 2018-06-21 ENCOUNTER — Inpatient Hospital Stay: Payer: PPO

## 2018-06-21 ENCOUNTER — Inpatient Hospital Stay (HOSPITAL_BASED_OUTPATIENT_CLINIC_OR_DEPARTMENT_OTHER): Payer: PPO | Admitting: Internal Medicine

## 2018-06-21 ENCOUNTER — Other Ambulatory Visit: Payer: Self-pay

## 2018-06-21 ENCOUNTER — Other Ambulatory Visit: Payer: Self-pay | Admitting: *Deleted

## 2018-06-21 DIAGNOSIS — J449 Chronic obstructive pulmonary disease, unspecified: Secondary | ICD-10-CM

## 2018-06-21 DIAGNOSIS — D649 Anemia, unspecified: Secondary | ICD-10-CM

## 2018-06-21 DIAGNOSIS — C3492 Malignant neoplasm of unspecified part of left bronchus or lung: Secondary | ICD-10-CM

## 2018-06-21 DIAGNOSIS — R42 Dizziness and giddiness: Secondary | ICD-10-CM | POA: Diagnosis not present

## 2018-06-21 DIAGNOSIS — C3431 Malignant neoplasm of lower lobe, right bronchus or lung: Secondary | ICD-10-CM

## 2018-06-21 DIAGNOSIS — E876 Hypokalemia: Secondary | ICD-10-CM

## 2018-06-21 DIAGNOSIS — C3411 Malignant neoplasm of upper lobe, right bronchus or lung: Secondary | ICD-10-CM

## 2018-06-21 DIAGNOSIS — G939 Disorder of brain, unspecified: Secondary | ICD-10-CM | POA: Diagnosis not present

## 2018-06-21 DIAGNOSIS — I639 Cerebral infarction, unspecified: Secondary | ICD-10-CM | POA: Diagnosis not present

## 2018-06-21 DIAGNOSIS — Z720 Tobacco use: Secondary | ICD-10-CM

## 2018-06-21 DIAGNOSIS — I82402 Acute embolism and thrombosis of unspecified deep veins of left lower extremity: Secondary | ICD-10-CM

## 2018-06-21 LAB — CBC WITH DIFFERENTIAL/PLATELET
BASOS PCT: 1 %
Basophils Absolute: 0 10*3/uL (ref 0–0.1)
Eosinophils Absolute: 0.1 10*3/uL (ref 0–0.7)
Eosinophils Relative: 1 %
HEMATOCRIT: 29.4 % — AB (ref 35.0–47.0)
HEMOGLOBIN: 10 g/dL — AB (ref 12.0–16.0)
Lymphocytes Relative: 11 %
Lymphs Abs: 1 10*3/uL (ref 1.0–3.6)
MCH: 31.3 pg (ref 26.0–34.0)
MCHC: 34.1 g/dL (ref 32.0–36.0)
MCV: 91.9 fL (ref 80.0–100.0)
MONOS PCT: 10 %
Monocytes Absolute: 0.9 10*3/uL (ref 0.2–0.9)
NEUTROS PCT: 77 %
Neutro Abs: 6.9 10*3/uL — ABNORMAL HIGH (ref 1.4–6.5)
Platelets: 609 10*3/uL — ABNORMAL HIGH (ref 150–440)
RBC: 3.2 MIL/uL — AB (ref 3.80–5.20)
RDW: 15.5 % — ABNORMAL HIGH (ref 11.5–14.5)
WBC: 8.8 10*3/uL (ref 3.6–11.0)

## 2018-06-21 LAB — COMPREHENSIVE METABOLIC PANEL
ALK PHOS: 155 U/L — AB (ref 38–126)
ALT: 11 U/L (ref 0–44)
ANION GAP: 10 (ref 5–15)
AST: 22 U/L (ref 15–41)
Albumin: 2.4 g/dL — ABNORMAL LOW (ref 3.5–5.0)
BUN: 8 mg/dL (ref 8–23)
CALCIUM: 8.5 mg/dL — AB (ref 8.9–10.3)
CO2: 26 mmol/L (ref 22–32)
Chloride: 98 mmol/L (ref 98–111)
Creatinine, Ser: 0.49 mg/dL (ref 0.44–1.00)
Glucose, Bld: 114 mg/dL — ABNORMAL HIGH (ref 70–99)
Potassium: 2.5 mmol/L — CL (ref 3.5–5.1)
SODIUM: 134 mmol/L — AB (ref 135–145)
Total Bilirubin: 0.3 mg/dL (ref 0.3–1.2)
Total Protein: 6.7 g/dL (ref 6.5–8.1)

## 2018-06-21 LAB — TSH: TSH: 0.597 u[IU]/mL (ref 0.350–4.500)

## 2018-06-21 MED ORDER — HEPARIN SOD (PORK) LOCK FLUSH 100 UNIT/ML IV SOLN
500.0000 [IU] | Freq: Once | INTRAVENOUS | Status: AC
  Administered 2018-06-21: 500 [IU] via INTRAVENOUS
  Filled 2018-06-21: qty 5

## 2018-06-21 MED ORDER — SODIUM CHLORIDE 0.9% FLUSH
10.0000 mL | Freq: Once | INTRAVENOUS | Status: AC | PRN
  Administered 2018-06-21: 10 mL
  Filled 2018-06-21: qty 10

## 2018-06-21 MED ORDER — SODIUM CHLORIDE 0.9 % IV SOLN
Freq: Once | INTRAVENOUS | Status: AC
  Administered 2018-06-21: 15:00:00 via INTRAVENOUS
  Filled 2018-06-21: qty 250

## 2018-06-21 MED ORDER — POTASSIUM CHLORIDE 20 MEQ/100ML IV SOLN
20.0000 meq | Freq: Once | INTRAVENOUS | Status: AC
  Administered 2018-06-21: 20 meq via INTRAVENOUS

## 2018-06-21 MED ORDER — SODIUM CHLORIDE 0.9% FLUSH
10.0000 mL | Freq: Once | INTRAVENOUS | Status: AC
  Administered 2018-06-21: 10 mL via INTRAVENOUS
  Filled 2018-06-21: qty 10

## 2018-06-21 NOTE — Progress Notes (Signed)
Here for follow up.  Stated " tired all the time -I go to sleep at 530 pm and sleep thru the night till 6 am "  Constantly tired she stated. -very unusual for her she stated.

## 2018-06-21 NOTE — Progress Notes (Signed)
[place] OFFICE PROGRESS NOTE  Patient Care Team: [place] Pink, MD as PCP - General (Family Medicine) Cammie Sickle, MD as Medical Oncologist (Medical Oncology) Rockey Situ, Kathlene November, MD as Consulting Physician (Cardiology) Efrain Sella, MD as Consulting Physician (Gastroenterology) Noreene Filbert, MD as Referring Physician (Radiation Oncology)  Cancer Staging Malignant neoplasm of lung [facility]) Staging form: Lung, AJCC 7th Edition - Clinical: Stage IIIA (T3, N2, M0) - Signed by Forest Gleason, MD on 05/07/2015    Oncology History   # Squamous cell carcinoma of right LOWER LOBE OF lung stage IIIA based on PET scan Biopsy of the (August of 2016). 2. After 3 cycles of chemotherapy patient underwent resection of lung mass (November, 2016) ypT2B ypN0 [Dr.Oaks] status post right lower lobectomy with a 3. She was started on carboplatinum and Taxol on a weekly basis and radiation therapy from January of 2017 4. After 2 cycles of carboplatinum patient had neuropathy grade 2 interfering with work so chemotherapy was put on hold and radiation was continued (January 19th, 2017) 5. Chemotherapy was discontinued because of progressive neuropathy. Patient is finishing of radiation therapy on November 19, 2015  # NOV 2017- CT- 51mm right hilar LN; PET- Feb 2018- NED.   # DEC-JAN 2019- RUL Lung nodule [s/p Bronch; Dr.Kasa- non-diagnostic] SBRT Feb 2019  # AUG 2019- R LE DVT [xarelto];Syncope- MRI- 13 mm cystic leision/asymptomatic/[facility]; Bil subacute cerebellar stroke;  AUG 2019 - GIB/anemia [EGD/colo- Dr.Toledo; NEG];   DIAGNOSIS: RLL stage III lung ca; RUL stage I   GOALS: curative  CURRENT/MOST RECENT THERAPY : Surveillaince      Malignant neoplasm of lung (HCC)    Malignant neoplasm of right upper lobe of lung ([place])      INTERVAL HISTORY:  Claudia Chavez 62 y.o.  female pleasant patient above history of stage III lung cancer; and also  stage I right upper lobe status post radiation February 2019 is here for follow-up of abnormal MRI.  Patient had been feeling poorly over the last few weeks.  Patient has had 2 hospitalizations in the last few weeks.  Patient noted to have lower extremity DVT for which she was started on Xarelto. Recent MRI of the brain ordered for syncopal episodes-13 mm cystic lesion noted in the frontal lobe.  This was thought to be clinically insignificant.  However to have bilateral  with acute strokes.   However, her clinical course was complicated by dropping hemoglobin up to 7; significant admission to the hospital for EGD colonoscopy which was again unremarkable.  Patient continues for follow-up.  Continues to have dizzy spells.  No blood in stools black or stools.   Review of Systems  Constitutional: Positive for malaise/fatigue. Negative for chills, diaphoresis, fever and weight loss.  HENT: Negative for nosebleeds and sore throat.   Eyes: Negative for double vision.  Respiratory: Negative for cough, hemoptysis, sputum production, shortness of breath and wheezing.   Cardiovascular: Negative for chest pain, palpitations, orthopnea and leg swelling.  Gastrointestinal: Negative for abdominal pain, blood in stool, constipation, diarrhea, heartburn, melena, nausea and vomiting.  Genitourinary: Negative for dysuria, frequency and urgency.  Musculoskeletal: Negative for back pain and joint pain.  Skin: Negative.  Negative for itching and rash.  Neurological: Positive for dizziness. Negative for tingling, focal weakness, weakness and headaches.  Endo/Heme/Allergies: Does not bruise/bleed easily.  Psychiatric/Behavioral: Negative for depression. The patient is not nervous/anxious and does not have insomnia.       PAST MEDICAL HISTORY :  Past Medical History:  Diagnosis Date  . Cancer of lower lobe of right lung ([place]) 05/07/2015  . COPD (chronic obstructive pulmonary disease) ([place])   . Non-small cell  lung cancer ([place])   . Pneumonia 04/2015    PAST SURGICAL HISTORY :   Past Surgical History:  Procedure Laterality Date  . ABDOMINAL HYSTERECTOMY    . COLONOSCOPY WITH PROPOFOL N/A 06/09/2018   Procedure: COLONOSCOPY WITH PROPOFOL;  Surgeon: Toledo, Benay Pike, MD;  Location: [facility] ENDOSCOPY;  Service: Gastroenterology;  Laterality: N/A;  . ELBOW SURGERY Right 1995  . ELECTROMAGNETIC NAVIGATION BROCHOSCOPY N/A 10/25/2017   Procedure: ELECTROMAGNETIC NAVIGATION BRONCHOSCOPY;  Surgeon: [name], MD;  Location: [facility] ORS;  Service: Cardiopulmonary;  Laterality: N/A;  . ESOPHAGOGASTRODUODENOSCOPY N/A 06/08/2018   Procedure: ESOPHAGOGASTRODUODENOSCOPY (EGD);  Surgeon: Toledo, Benay Pike, MD;  Location: [facility] ENDOSCOPY;  Service: Gastroenterology;  Laterality: N/A;  . PORTACATH PLACEMENT Right 05/10/2015   Procedure: INSERTION PORT-A-CATH;  Surgeon: Nestor Lewandowsky, MD;  Location: [facility] ORS;  Service: General;  Laterality: Right;  Marland Kitchen VIDEO ASSISTED THORACOSCOPY (VATS)/THOROCOTOMY Right 08/18/2015   Procedure: VIDEO ASSISTED THORACOSCOPY (VATS)/THOROCOTOMY;  Surgeon: Nestor Lewandowsky, MD;  Location: [facility] ORS;  Service: General;  Laterality: Right;    FAMILY HISTORY :   Family History  Problem Relation Age of Onset  . Stroke Mother   . Lung cancer Father 80    SOCIAL HISTORY:   Social History   Tobacco Use  . Smoking status: Current Every Day Smoker    Packs/day: 0.50    Years: 40.00    Pack years: 20.00    Types: Cigarettes  . Smokeless tobacco: Never Used  . Tobacco comment: "1 pk or less per day"  Substance Use Topics  . Alcohol use: Not Currently    Alcohol/week: 2.0 - 4.0 standard drinks    Types: 2 - 4 Cans of beer per week    Comment: beer-occasionally  . Drug use: No    ALLERGIES:  is allergic to keppra [levetiracetam] and lyrica [pregabalin].  MEDICATIONS:  Current Outpatient Medications  Medication Sig Dispense Refill  . Cholecalciferol (VITAMIN D3) 1000 units CAPS Take by mouth 2  (two) times daily.    . fludrocortisone (FLORINEF) 0.1 MG tablet Take 1 tablet (0.1 mg total) by mouth daily. 90 tablet 3  . Fluticasone-Salmeterol (ADVAIR DISKUS) 250-50 MCG/DOSE AEPB Inhale 1 puff into the lungs every 12 (twelve) hours.     Marland Kitchen ipratropium-albuterol (DUONEB) 0.5-2.5 (3) MG/3ML SOLN TAKE 3 MLS BY NEBULIZATION EVERY 4 (FOUR) HOURS AS NEEDED. 360 mL 3  . midodrine (PROAMATINE) 10 MG tablet Take 1 tablet (10 mg total) by mouth 3 (three) times daily as needed (As needed for Systolic less than 696). 270 tablet 3  . potassium chloride SA (K-DUR,KLOR-CON) 20 MEQ tablet 1 pill twice a day 40 tablet 3  . rivaroxaban (XARELTO) 20 MG TABS tablet Take 1 tablet (20 mg total) by mouth daily with supper. 30 tablet 1  . rosuvastatin (CRESTOR) 20 MG tablet Take 1 tablet (20 mg total) by mouth at bedtime. 30 tablet 0  . venlafaxine XR (EFFEXOR-XR) 37.5 MG 24 hr capsule Take by mouth.    . vitamin B-12 (CYANOCOBALAMIN) 1000 MCG tablet Take 1,000 mcg by mouth daily.    Marland Kitchen albuterol (PROVENTIL HFA;VENTOLIN HFA) 108 (90 Base) MCG/ACT inhaler Inhale 1 puff into the lungs every 6 (six) hours as needed for wheezing or shortness of breath.     No current facility-administered medications for this visit.  Facility-Administered Medications Ordered in Other Visits  Medication Dose Route Frequency Provider Last Rate Last Dose  . sodium chloride flush (NS) 0.9 % injection 10 mL  10 mL Intravenous PRN Cammie Sickle, MD        PHYSICAL EXAMINATION: ECOG PERFORMANCE STATUS: 1 - Symptomatic but completely ambulatory  BP (!) 146/84 (BP Location: Left Arm, Patient Position: Sitting)   Pulse 86   Temp (!) 97.3 F (36.3 C) (Tympanic)   Resp 18   Ht 5\' 1"  (1.549 m)   Wt 96 lb (43.5 kg)   BMI 18.14 kg/m   Filed Weights   06/21/18 1321 06/21/18 1329  Weight: 96 lb 6.4 oz (43.7 kg) 96 lb (43.5 kg)    Physical Exam  Constitutional: She is oriented to person, place, and time.  Thin built Caucasian  female patient.  She is alone.  Appears to have lost weight.  HENT:  Head: Normocephalic and atraumatic.  Mouth/Throat: Oropharynx is clear and moist. No oropharyngeal exudate.  Eyes: Pupils are equal, round, and reactive to light.  Neck: Normal range of motion. Neck supple.  Cardiovascular: Normal rate and regular rhythm.  Pulmonary/Chest: No respiratory distress. She has no wheezes.  Decreased air entry bilaterally.  Abdominal: Soft. Bowel sounds are normal. She exhibits no distension and no mass. There is no tenderness. There is no rebound and no guarding.  Musculoskeletal: Normal range of motion. She exhibits no edema or tenderness.  Neurological: She is alert and oriented to person, place, and time.  Skin: Skin is warm.  Psychiatric: Affect normal.       LABORATORY DATA:  I have reviewed the data as listed    Component Value Date/Time   NA 134 (L) 06/21/2018 1315   K 2.5 (LL) 06/21/2018 1315   CL 98 06/21/2018 1315   CO2 26 06/21/2018 1315   GLUCOSE 114 (H) 06/21/2018 1315   BUN 8 06/21/2018 1315   CREATININE 0.49 06/21/2018 1315   CALCIUM 8.5 (L) 06/21/2018 1315   PROT 6.7 06/21/2018 1315   ALBUMIN 2.4 (L) 06/21/2018 1315   AST 22 06/21/2018 1315   ALT 11 06/21/2018 1315   ALKPHOS 155 (H) 06/21/2018 1315   BILITOT 0.3 06/21/2018 1315   GFRNONAA >60 06/21/2018 1315   GFRAA >60 06/21/2018 1315    No results found for: SPEP, UPEP  Lab Results  Component Value Date   WBC 8.8 06/21/2018   NEUTROABS 6.9 (H) 06/21/2018   HGB 10.0 (L) 06/21/2018   HCT 29.4 (L) 06/21/2018   MCV 91.9 06/21/2018   PLT 609 (H) 06/21/2018      Chemistry      Component Value Date/Time   NA 134 (L) 06/21/2018 1315   K 2.5 (LL) 06/21/2018 1315   CL 98 06/21/2018 1315   CO2 26 06/21/2018 1315   BUN 8 06/21/2018 1315   CREATININE 0.49 06/21/2018 1315      Component Value Date/Time   CALCIUM 8.5 (L) 06/21/2018 1315   ALKPHOS 155 (H) 06/21/2018 1315   AST 22 06/21/2018 1315   ALT  11 06/21/2018 1315   BILITOT 0.3 06/21/2018 1315       RADIOGRAPHIC STUDIES: I have personally reviewed the radiological images as listed and agreed with the findings in the report. No results found.   ASSESSMENT & PLAN:  Malignant neoplasm of right upper lobe of lung [facility]) # Right upper lobe stage I lung cancer; s/p feb February 2019 SBRT; PET scan April 01, 2018 also suggestive of  radiation changes; less likely recurrence. STABLE.   # Left lung stage III lung cancer-STABLE  #Severe hypokalemia potassium 2.5.  Recommend IV potassium.  Question related to Florinef/midodrine.  Start outpatient potassium.  #Left frontal 13 mm cystic lesion incidental finding; follow-up with [facility].  Recommend MRI in 2 to 3 months.  #August 2019 left lower extremity below the knee DVT-on Xarelto.  Improved.[See discussion below]  #Anemia-question etiology [no obvious evidence of GI bleed status post recent EGD colonoscopy]; given the anemia discussed discontinuation of Xarelto in the context of her below-knee of the DVT; however patient will still need anticoagulation given her bilateral subacute infarcts in the cerebellum [see below]  # Bilateral cerebellar subacute infarcts-question symptomatic.  On Xarelto.  Defer to neurology.  # COPD stable.  # keep apt for oct 2nd/cbc/bmpMD; cancel the CT scan ordered for sep 30th.  Given the patient's complicated medical situation/absence of clear oncologic issues, I will reach out to patient's primary care physician for follow-up/.  Cc; [place]   No orders of the defined types were placed in this encounter.  All questions were answered. The patient knows to call the clinic with any problems, questions or concerns.      Cammie Sickle, MD 06/25/2018 12:36 AM

## 2018-06-21 NOTE — Assessment & Plan Note (Addendum)
#   Right upper lobe stage I lung cancer; s/p feb February 2019 SBRT; PET scan April 01, 2018 also suggestive of radiation changes; less likely recurrence. STABLE.   # Left lung stage III lung cancer-STABLE  #Severe hypokalemia potassium 2.5.  Recommend IV potassium.  Question related to Florinef/midodrine.  Start outpatient potassium.  #Left frontal 13 mm cystic lesion incidental finding; follow-up with [facility].  Recommend MRI in 2 to 3 months.  #August 2019 left lower extremity below the knee DVT-on Xarelto.  Improved.[See discussion below]  #Anemia-question etiology [no obvious evidence of GI bleed status post recent EGD colonoscopy]; given the anemia discussed discontinuation of Xarelto in the context of her below-knee of the DVT; however patient will still need anticoagulation given her bilateral subacute infarcts in the cerebellum [see below]  # Bilateral cerebellar subacute infarcts-question symptomatic.  On Xarelto.  Defer to neurology.  # COPD stable.  # keep apt for oct 2nd/cbc/bmpMD; cancel the CT scan ordered for sep 30th.  Given the patient's complicated medical situation/absence of clear oncologic issues, I will reach out to patient's primary care physician for follow-up/.  Cc; Kary Kos

## 2018-06-21 NOTE — Progress Notes (Signed)
1343 - critical potassium of 2.5 called to Renita Papa, RN by Collene Mares in c.ctr lab read back process performed. Dr. Rogue Bussing informed 1350-read back process performed with md.  Per v/o Dr. Rogue Bussing - infusion nurses may run the IV potassium 20 mcq over 1 hour. Patient has a port a cath. Hand off communication provided to [place] infusion nurse.

## 2018-06-22 LAB — VITAMIN B12: VITAMIN B 12: 603 pg/mL (ref 180–914)

## 2018-06-25 DIAGNOSIS — Z7901 Long term (current) use of anticoagulants: Secondary | ICD-10-CM | POA: Diagnosis not present

## 2018-06-25 DIAGNOSIS — Z8719 Personal history of other diseases of the digestive system: Secondary | ICD-10-CM | POA: Diagnosis not present

## 2018-06-25 DIAGNOSIS — K5730 Diverticulosis of large intestine without perforation or abscess without bleeding: Secondary | ICD-10-CM | POA: Diagnosis not present

## 2018-06-26 ENCOUNTER — Other Ambulatory Visit: Payer: Self-pay | Admitting: Internal Medicine

## 2018-06-28 ENCOUNTER — Other Ambulatory Visit: Payer: Self-pay | Admitting: *Deleted

## 2018-06-28 ENCOUNTER — Inpatient Hospital Stay: Payer: PPO

## 2018-06-28 DIAGNOSIS — E876 Hypokalemia: Secondary | ICD-10-CM

## 2018-06-28 DIAGNOSIS — C3411 Malignant neoplasm of upper lobe, right bronchus or lung: Secondary | ICD-10-CM

## 2018-06-28 DIAGNOSIS — C3431 Malignant neoplasm of lower lobe, right bronchus or lung: Secondary | ICD-10-CM | POA: Diagnosis not present

## 2018-06-28 LAB — BASIC METABOLIC PANEL
Anion gap: 12 (ref 5–15)
BUN: 8 mg/dL (ref 8–23)
CHLORIDE: 98 mmol/L (ref 98–111)
CO2: 28 mmol/L (ref 22–32)
Calcium: 8.6 mg/dL — ABNORMAL LOW (ref 8.9–10.3)
Creatinine, Ser: 0.56 mg/dL (ref 0.44–1.00)
GFR calc Af Amer: >60 mL/min (ref >60)
GFR calc non Af Amer: >60 mL/min (ref >60)
GLUCOSE: 137 mg/dL — AB (ref 70–99)
Potassium: 2.4 mmol/L — CL (ref 3.5–5.1)
Sodium: 138 mmol/L (ref 135–145)

## 2018-06-28 LAB — MAGNESIUM: MAGNESIUM: 2 mg/dL (ref 1.7–2.4)

## 2018-07-01 ENCOUNTER — Ambulatory Visit: Payer: PPO

## 2018-07-01 ENCOUNTER — Inpatient Hospital Stay: Payer: PPO

## 2018-07-01 DIAGNOSIS — C3431 Malignant neoplasm of lower lobe, right bronchus or lung: Secondary | ICD-10-CM | POA: Diagnosis not present

## 2018-07-01 DIAGNOSIS — E876 Hypokalemia: Secondary | ICD-10-CM

## 2018-07-01 DIAGNOSIS — C3411 Malignant neoplasm of upper lobe, right bronchus or lung: Secondary | ICD-10-CM

## 2018-07-01 LAB — POTASSIUM: Potassium: 3.2 mmol/L — ABNORMAL LOW (ref 3.5–5.1)

## 2018-07-03 ENCOUNTER — Inpatient Hospital Stay: Payer: PPO | Attending: Internal Medicine

## 2018-07-03 ENCOUNTER — Encounter: Payer: Self-pay | Admitting: Internal Medicine

## 2018-07-03 ENCOUNTER — Inpatient Hospital Stay: Payer: PPO

## 2018-07-03 ENCOUNTER — Inpatient Hospital Stay (HOSPITAL_BASED_OUTPATIENT_CLINIC_OR_DEPARTMENT_OTHER): Payer: PPO | Admitting: Internal Medicine

## 2018-07-03 VITALS — BP 137/75 | HR 105 | Temp 97.3°F | Resp 16 | Wt 97.0 lb

## 2018-07-03 DIAGNOSIS — I639 Cerebral infarction, unspecified: Secondary | ICD-10-CM | POA: Diagnosis not present

## 2018-07-03 DIAGNOSIS — C3492 Malignant neoplasm of unspecified part of left bronchus or lung: Secondary | ICD-10-CM | POA: Insufficient documentation

## 2018-07-03 DIAGNOSIS — E876 Hypokalemia: Secondary | ICD-10-CM | POA: Insufficient documentation

## 2018-07-03 DIAGNOSIS — G939 Disorder of brain, unspecified: Secondary | ICD-10-CM

## 2018-07-03 DIAGNOSIS — C3411 Malignant neoplasm of upper lobe, right bronchus or lung: Secondary | ICD-10-CM | POA: Diagnosis not present

## 2018-07-03 DIAGNOSIS — Z79899 Other long term (current) drug therapy: Secondary | ICD-10-CM | POA: Diagnosis not present

## 2018-07-03 DIAGNOSIS — R5383 Other fatigue: Secondary | ICD-10-CM

## 2018-07-03 DIAGNOSIS — Z720 Tobacco use: Secondary | ICD-10-CM

## 2018-07-03 DIAGNOSIS — I82402 Acute embolism and thrombosis of unspecified deep veins of left lower extremity: Secondary | ICD-10-CM | POA: Insufficient documentation

## 2018-07-03 LAB — COMPREHENSIVE METABOLIC PANEL
ALT: 11 U/L (ref 0–44)
AST: 19 U/L (ref 15–41)
Albumin: 2.6 g/dL — ABNORMAL LOW (ref 3.5–5.0)
Alkaline Phosphatase: 129 U/L — ABNORMAL HIGH (ref 38–126)
Anion gap: 12 (ref 5–15)
BUN: 10 mg/dL (ref 8–23)
CHLORIDE: 97 mmol/L — AB (ref 98–111)
CO2: 30 mmol/L (ref 22–32)
CREATININE: 0.66 mg/dL (ref 0.44–1.00)
Calcium: 8.7 mg/dL — ABNORMAL LOW (ref 8.9–10.3)
Glucose, Bld: 113 mg/dL — ABNORMAL HIGH (ref 70–99)
Potassium: 2.6 mmol/L — CL (ref 3.5–5.1)
Sodium: 139 mmol/L (ref 135–145)
TOTAL PROTEIN: 7 g/dL (ref 6.5–8.1)
Total Bilirubin: 0.2 mg/dL — ABNORMAL LOW (ref 0.3–1.2)

## 2018-07-03 LAB — CBC WITH DIFFERENTIAL/PLATELET
BASOS ABS: 0.1 10*3/uL (ref 0–0.1)
BASOS PCT: 1 %
EOS ABS: 0.1 10*3/uL (ref 0–0.7)
Eosinophils Relative: 1 %
HCT: 29.5 % — ABNORMAL LOW (ref 35.0–47.0)
Hemoglobin: 10.1 g/dL — ABNORMAL LOW (ref 12.0–16.0)
LYMPHS ABS: 1.1 10*3/uL (ref 1.0–3.6)
LYMPHS PCT: 11 %
MCH: 31.2 pg (ref 26.0–34.0)
MCHC: 34.2 g/dL (ref 32.0–36.0)
MCV: 91.2 fL (ref 80.0–100.0)
Monocytes Absolute: 0.6 10*3/uL (ref 0.2–0.9)
Monocytes Relative: 6 %
Neutro Abs: 7.6 10*3/uL — ABNORMAL HIGH (ref 1.4–6.5)
Neutrophils Relative %: 81 %
PLATELETS: 565 10*3/uL — AB (ref 150–440)
RBC: 3.24 MIL/uL — ABNORMAL LOW (ref 3.80–5.20)
RDW: 16.2 % — AB (ref 11.5–14.5)
WBC: 9.4 10*3/uL (ref 3.6–11.0)

## 2018-07-03 MED ORDER — HEPARIN SOD (PORK) LOCK FLUSH 100 UNIT/ML IV SOLN
500.0000 [IU] | Freq: Once | INTRAVENOUS | Status: AC | PRN
  Administered 2018-07-03: 500 [IU]
  Filled 2018-07-03: qty 5

## 2018-07-03 MED ORDER — POTASSIUM CHLORIDE 20 MEQ/100ML IV SOLN
20.0000 meq | Freq: Once | INTRAVENOUS | Status: AC
  Administered 2018-07-03: 20 meq via INTRAVENOUS

## 2018-07-03 MED ORDER — SODIUM CHLORIDE 0.9 % IV SOLN
Freq: Once | INTRAVENOUS | Status: AC
  Administered 2018-07-03: 11:00:00 via INTRAVENOUS
  Filled 2018-07-03: qty 250

## 2018-07-03 MED ORDER — SODIUM CHLORIDE 0.9% FLUSH
10.0000 mL | Freq: Once | INTRAVENOUS | Status: AC | PRN
  Administered 2018-07-03: 10 mL
  Filled 2018-07-03: qty 10

## 2018-07-03 NOTE — Progress Notes (Signed)
[facility] OFFICE PROGRESS NOTE  Patient Care Team: Maryland Pink, MD as PCP - General (Family Medicine) Cammie Sickle, MD as Medical Oncologist (Medical Oncology) Rockey Situ, Kathlene November, MD as Consulting Physician (Cardiology) Efrain Sella, MD as Consulting Physician (Gastroenterology) Noreene Filbert, MD as Referring Physician (Radiation Oncology)  Cancer Staging Malignant neoplasm of lung [facility]) Staging form: Lung, AJCC 7th Edition - Clinical: Stage IIIA (T3, N2, M0) - Signed by Forest Gleason, MD on 05/07/2015    Oncology History   # Squamous cell carcinoma of right LOWER LOBE OF lung stage IIIA based on PET scan Biopsy of the (August of 2016). 2. After 3 cycles of chemotherapy patient underwent resection of lung mass (November, 2016) ypT2B ypN0 [Dr.Oaks] status post right lower lobectomy with a 3. She was started on carboplatinum and Taxol on a weekly basis and radiation therapy from January of 2017 4. After 2 cycles of carboplatinum patient had neuropathy grade 2 interfering with work so chemotherapy was put on hold and radiation was continued (January 19th, 2017) 5. Chemotherapy was discontinued because of progressive neuropathy. Patient is finishing of radiation therapy on November 19, 2015  # NOV 2017- CT- 63mm right hilar LN; PET- Feb 2018- NED.   # DEC-JAN 2019- RUL Lung nodule [s/p Bronch; Dr.Kasa- non-diagnostic] SBRT Feb 2019  # AUG 2019- R LE DVT [xarelto];Syncope- MRI- 13 mm cystic leision/asymptomatic/[facility]; Bil subacute cerebellar stroke;  AUG 2019 - GIB/anemia [EGD/colo- Dr.Toledo; NEG];   DIAGNOSIS: RLL stage III lung ca; RUL stage I   GOALS: curative  CURRENT/MOST RECENT THERAPY : Surveillaince      Malignant neoplasm of lung (HCC)    Malignant neoplasm of right upper lobe of lung ([place])      INTERVAL HISTORY:  Claudia Chavez 62 y.o.  female pleasant patient above history of stage III lung cancer; and also  stage I right upper lobe status post radiation February 2019 is here for follow-up.  Patient's recent clinical course was complicated by left lower extremity DVT on Xarelto; severe anemia needing transfusion.;  Also syncopal episode started on Florinef; Midodrine.  Also diagnosed with bilateral  strokes.;  And also a 30 mm cystic lesion noted in the frontal lobe.  More recently patient noted to have severe hypokalemia potassium around 2.5-2.6 even on potassium supplementation up to 40 to 60 mEq.  She has not been in the hospital since the last visit.  She continues to feel fatigue.  Review of Systems  Constitutional: Positive for malaise/fatigue. Negative for chills, diaphoresis, fever and weight loss.  HENT: Negative for nosebleeds and sore throat.   Eyes: Negative for double vision.  Respiratory: Negative for cough, hemoptysis, sputum production, shortness of breath and wheezing.   Cardiovascular: Negative for chest pain, palpitations, orthopnea and leg swelling.  Gastrointestinal: Negative for abdominal pain, blood in stool, constipation, diarrhea, heartburn, melena, nausea and vomiting.  Genitourinary: Negative for dysuria, frequency and urgency.  Musculoskeletal: Negative for back pain and joint pain.  Skin: Negative.  Negative for itching and rash.  Neurological: Positive for dizziness. Negative for tingling, focal weakness, weakness and headaches.  Endo/Heme/Allergies: Does not bruise/bleed easily.  Psychiatric/Behavioral: Negative for depression. The patient is not nervous/anxious and does not have insomnia.       PAST MEDICAL HISTORY :  Past Medical History:  Diagnosis Date  . Cancer of lower lobe of right lung ([place]) 05/07/2015  . COPD (chronic obstructive pulmonary disease) ([place])   . Non-small cell lung cancer ([place])   .  Pneumonia 04/2015    PAST SURGICAL HISTORY :   Past Surgical History:  Procedure Laterality Date  . ABDOMINAL HYSTERECTOMY    . COLONOSCOPY WITH PROPOFOL  N/A 06/09/2018   Procedure: COLONOSCOPY WITH PROPOFOL;  Surgeon: Toledo, Benay Pike, MD;  Location: [facility] ENDOSCOPY;  Service: Gastroenterology;  Laterality: N/A;  . ELBOW SURGERY Right 1995  . ELECTROMAGNETIC NAVIGATION BROCHOSCOPY N/A 10/25/2017   Procedure: ELECTROMAGNETIC NAVIGATION BRONCHOSCOPY;  Surgeon: [name], MD;  Location: [facility] ORS;  Service: Cardiopulmonary;  Laterality: N/A;  . ESOPHAGOGASTRODUODENOSCOPY N/A 06/08/2018   Procedure: ESOPHAGOGASTRODUODENOSCOPY (EGD);  Surgeon: Toledo, Benay Pike, MD;  Location: [facility] ENDOSCOPY;  Service: Gastroenterology;  Laterality: N/A;  . PORTACATH PLACEMENT Right 05/10/2015   Procedure: INSERTION PORT-A-CATH;  Surgeon: Nestor Lewandowsky, MD;  Location: [facility] ORS;  Service: General;  Laterality: Right;  Marland Kitchen VIDEO ASSISTED THORACOSCOPY (VATS)/THOROCOTOMY Right 08/18/2015   Procedure: VIDEO ASSISTED THORACOSCOPY (VATS)/THOROCOTOMY;  Surgeon: Nestor Lewandowsky, MD;  Location: [facility] ORS;  Service: General;  Laterality: Right;    FAMILY HISTORY :   Family History  Problem Relation Age of Onset  . Stroke Mother   . Lung cancer Father 35    SOCIAL HISTORY:   Social History   Tobacco Use  . Smoking status: Current Every Day Smoker    Packs/day: 0.50    Years: 40.00    Pack years: 20.00    Types: Cigarettes  . Smokeless tobacco: Never Used  . Tobacco comment: "1 pk or less per day"  Substance Use Topics  . Alcohol use: Not Currently    Alcohol/week: 2.0 - 4.0 standard drinks    Types: 2 - 4 Cans of beer per week    Comment: beer-occasionally  . Drug use: No    ALLERGIES:  is allergic to keppra [levetiracetam] and lyrica [pregabalin].  MEDICATIONS:  Current Outpatient Medications  Medication Sig Dispense Refill  . albuterol (PROVENTIL HFA;VENTOLIN HFA) 108 (90 Base) MCG/ACT inhaler Inhale 1 puff into the lungs every 6 (six) hours as needed for wheezing or shortness of breath.    . Cholecalciferol (VITAMIN D3) 1000 units CAPS Take by mouth 2 (two) times  daily.    . Fluticasone-Salmeterol (ADVAIR DISKUS) 250-50 MCG/DOSE AEPB Inhale 1 puff into the lungs every 12 (twelve) hours.     Marland Kitchen ipratropium-albuterol (DUONEB) 0.5-2.5 (3) MG/3ML SOLN TAKE 3 MLS BY NEBULIZATION EVERY 4 (FOUR) HOURS AS NEEDED. 360 mL 3  . midodrine (PROAMATINE) 10 MG tablet Take 1 tablet (10 mg total) by mouth 3 (three) times daily as needed (As needed for Systolic less than 939). 270 tablet 3  . potassium chloride SA (K-DUR,KLOR-CON) 20 MEQ tablet 1 pill twice a day 40 tablet 3  . rivaroxaban (XARELTO) 20 MG TABS tablet Take 1 tablet (20 mg total) by mouth daily with supper. 30 tablet 1  . rosuvastatin (CRESTOR) 20 MG tablet Take 1 tablet (20 mg total) by mouth at bedtime. 30 tablet 0  . venlafaxine XR (EFFEXOR-XR) 37.5 MG 24 hr capsule Take by mouth.    . vitamin B-12 (CYANOCOBALAMIN) 1000 MCG tablet Take 1,000 mcg by mouth daily.    . fludrocortisone (FLORINEF) 0.1 MG tablet Take 1 tablet (0.1 mg total) by mouth daily. (Patient not taking: Reported on 07/03/2018) 90 tablet 3   No current facility-administered medications for this visit.    Facility-Administered Medications Ordered in Other Visits  Medication Dose Route Frequency Provider Last Rate Last Dose  . 0.9 %  sodium chloride infusion   Intravenous Once Charlaine Dalton  R, MD      . heparin lock flush 100 unit/mL  500 Units Intracatheter Once PRN Charlaine Dalton R, MD      . potassium chloride 20 mEq in 100 mL IVPB  20 mEq Intravenous Once Charlaine Dalton R, MD      . sodium chloride flush (NS) 0.9 % injection 10 mL  10 mL Intravenous PRN Charlaine Dalton R, MD      . sodium chloride flush (NS) 0.9 % injection 10 mL  10 mL Intracatheter Once PRN Cammie Sickle, MD        PHYSICAL EXAMINATION: ECOG PERFORMANCE STATUS: 1 - Symptomatic but completely ambulatory  BP 137/75 (BP Location: Left Arm, Patient Position: Sitting)   Pulse (!) 105   Temp (!) 97.3 F (36.3 C) (Tympanic)   Resp 16   Wt  97 lb (44 kg)   BMI 18.33 kg/m   Filed Weights   07/03/18 1007  Weight: 97 lb (44 kg)    Physical Exam  Constitutional: She is oriented to person, place, and time.  Thin built Caucasian female patient.  She is alone.  Appears to have lost weight.  HENT:  Head: Normocephalic and atraumatic.  Mouth/Throat: Oropharynx is clear and moist. No oropharyngeal exudate.  Eyes: Pupils are equal, round, and reactive to light.  Neck: Normal range of motion. Neck supple.  Cardiovascular: Normal rate and regular rhythm.  Pulmonary/Chest: No respiratory distress. She has no wheezes.  Decreased air entry bilaterally.  Abdominal: Soft. Bowel sounds are normal. She exhibits no distension and no mass. There is no tenderness. There is no rebound and no guarding.  Musculoskeletal: Normal range of motion. She exhibits no edema or tenderness.  Neurological: She is alert and oriented to person, place, and time.  Skin: Skin is warm.  Psychiatric: Affect normal.       LABORATORY DATA:  I have reviewed the data as listed    Component Value Date/Time   NA 139 07/03/2018 0940   K 2.6 (LL) 07/03/2018 0940   CL 97 (L) 07/03/2018 0940   CO2 30 07/03/2018 0940   GLUCOSE 113 (H) 07/03/2018 0940   BUN 10 07/03/2018 0940   CREATININE 0.66 07/03/2018 0940   CALCIUM 8.7 (L) 07/03/2018 0940   PROT 7.0 07/03/2018 0940   ALBUMIN 2.6 (L) 07/03/2018 0940   AST 19 07/03/2018 0940   ALT 11 07/03/2018 0940   ALKPHOS 129 (H) 07/03/2018 0940   BILITOT 0.2 (L) 07/03/2018 0940   GFRNONAA >60 07/03/2018 0940   GFRAA >60 07/03/2018 0940    No results found for: SPEP, UPEP  Lab Results  Component Value Date   WBC 9.4 07/03/2018   NEUTROABS 7.6 (H) 07/03/2018   HGB 10.1 (L) 07/03/2018   HCT 29.5 (L) 07/03/2018   MCV 91.2 07/03/2018   PLT 565 (H) 07/03/2018      Chemistry      Component Value Date/Time   NA 139 07/03/2018 0940   K 2.6 (LL) 07/03/2018 0940   CL 97 (L) 07/03/2018 0940   CO2 30 07/03/2018  0940   BUN 10 07/03/2018 0940   CREATININE 0.66 07/03/2018 0940      Component Value Date/Time   CALCIUM 8.7 (L) 07/03/2018 0940   ALKPHOS 129 (H) 07/03/2018 0940   AST 19 07/03/2018 0940   ALT 11 07/03/2018 0940   BILITOT 0.2 (L) 07/03/2018 0940       RADIOGRAPHIC STUDIES: I have personally reviewed the radiological images as listed and  agreed with the findings in the report. No results found.   ASSESSMENT & PLAN:  Malignant neoplasm of right upper lobe of lung (HCC) #Recurrent severe hypokalemia potassium 2.5.  Question related to Florinef; STOP florinef; continue midodrine 10 mg BID-TID prn. Continue outpatient potassium TID.  Recommend IV 20 potassium again today.  # Aug 2019 left lower extremity below the knee DVT-on Xarelto.  Improved.[See discussion below]; Hb-10.  Patient will need anticoagulation for 3 months.  However likely need continue Xarelto for her history of strokes [see below].  # Left frontal 13 mm cystic lesion incidental finding; follow-up with [facility].  Recommend MRI in 2 to 3 months.  #August 2019 left lower extremity below the knee DVT-on Xarelto.  Improved.[See discussion below]; Hb-10.   # Bilateral cerebellar subacute infarcts-question symptomatic.  On Xarelto.  Defer to neurology; spoke to [place] re: neurology referral.  Again reminded the patient that needs evaluation with neurology.  She will call PCPs office.  # Right upper lobe stage I lung cancer; s/p feb February 2019 SBRT; PET scan April 01, 2018 also suggestive of radiation changes; less likely recurrence. STABLE.   # Left lung stage III lung cancer-STABLE  #Anemia-question etiology [no obvious evidence of GI bleed status post recent EGD colonoscopy]; hemoglobin stable 10.  # follow up in 1 month/labs; 1 week BMP potassium checked.  Discussed with Dr. Kary Kos.  Cc; Hedrick/Gollan   No orders of the defined types were placed in this encounter.  All questions were answered. The  patient knows to call the clinic with any problems, questions or concerns.      Cammie Sickle, MD 07/03/2018 10:48 AM

## 2018-07-03 NOTE — Patient Instructions (Addendum)
#  Please call your family doctor Dr. Kary Kos regarding a referral to a neurologist [not a neurosurgeon] 4-year history of strokes.  # STOP FLORINE; continue Midodrine twice a day.

## 2018-07-03 NOTE — Assessment & Plan Note (Addendum)
#  Recurrent severe hypokalemia potassium 2.5.  Question related to Florinef; STOP florinef; continue midodrine 10 mg BID-TID prn. Continue outpatient potassium TID.  Recommend IV 20 potassium again today.  # Aug 2019 left lower extremity below the knee DVT-on Xarelto.  Improved.[See discussion below]; Hb-10.  Patient will need anticoagulation for 3 months.  However likely need continue Xarelto for her history of strokes [see below].  # Left frontal 13 mm cystic lesion incidental finding; follow-up with [facility].  Recommend MRI in 2 to 3 months.  #August 2019 left lower extremity below the knee DVT-on Xarelto.  Improved.[See discussion below]; Hb-10.   # Bilateral cerebellar subacute infarcts-question symptomatic.  On Xarelto.  Defer to neurology; spoke to [place] re: neurology referral.  Again reminded the patient that needs evaluation with neurology.  She will call PCPs office.  # Right upper lobe stage I lung cancer; s/p feb February 2019 SBRT; PET scan April 01, 2018 also suggestive of radiation changes; less likely recurrence. STABLE.   # Left lung stage III lung cancer-STABLE  #Anemia-question etiology [no obvious evidence of GI bleed status post recent EGD colonoscopy]; hemoglobin stable 10.  # follow up in 1 month/labs; 1 week BMP potassium checked.  Discussed with Dr. Kary Kos.  Cc; Hedrick/Gollan

## 2018-07-08 ENCOUNTER — Ambulatory Visit: Payer: PPO | Admitting: Cardiovascular Disease

## 2018-07-08 ENCOUNTER — Encounter

## 2018-07-10 ENCOUNTER — Inpatient Hospital Stay: Payer: PPO

## 2018-07-10 DIAGNOSIS — C3411 Malignant neoplasm of upper lobe, right bronchus or lung: Secondary | ICD-10-CM

## 2018-07-10 LAB — BASIC METABOLIC PANEL
ANION GAP: 11 (ref 5–15)
BUN: 12 mg/dL (ref 8–23)
CHLORIDE: 99 mmol/L (ref 98–111)
CO2: 24 mmol/L (ref 22–32)
Calcium: 9.5 mg/dL (ref 8.9–10.3)
Creatinine, Ser: 0.71 mg/dL (ref 0.44–1.00)
GFR calc non Af Amer: >60 mL/min (ref >60)
GLUCOSE: 108 mg/dL — AB (ref 70–99)
POTASSIUM: 4.6 mmol/L (ref 3.5–5.1)
Sodium: 134 mmol/L — ABNORMAL LOW (ref 135–145)

## 2018-07-18 DIAGNOSIS — Z8673 Personal history of transient ischemic attack (TIA), and cerebral infarction without residual deficits: Secondary | ICD-10-CM | POA: Diagnosis not present

## 2018-07-18 DIAGNOSIS — F329 Major depressive disorder, single episode, unspecified: Secondary | ICD-10-CM | POA: Diagnosis not present

## 2018-07-28 NOTE — Progress Notes (Signed)
Cardiology Office Note  Date:  07/30/2018   ID:  Claudia Chavez, DOB 1955-11-24, MRN [id number]  PCP:  [place] Pink, MD   Chief Complaint  Patient presents with  . OTHER    F/U .Marland KitchenMarland KitchenPT HAS NO COMPLAINTS.Marland Kitchen REVIEWED MEDS VERBALLY WITH PT..    HPI:  Claudia Chavez is a 62 year old woman with past medical history of Syncope GI bleed , lung CA, stage III ,2016 COPD status post chemoradiation DVT on Xarelto,  mini strokes, document  on MRI  Admitted to hospital secondary to weakness and dark tarry stools Who presents for follow-up of her syncope and near syncope, orthostatic hypotension  Last office visit she was started on  start fludrocortisone 0.1 mg daily for symptomatic orthostasis  also prescribed midodrine 10 mg PRN  Subsequently developed severe hypokalemia potassium around 2.6  Low potassium most of sept 2019 Florinef was stopped Recent lab potassium 4.6 on potassium 20 BID  She has started taking midodrine Very sporadic with her dosing, sometimes 2 pills at a time at various times in the day, took 2 last night Denies any orthostasis symptoms Reports blood pressure has been running better  Had PRBC transfusion for HGB 7.3, 06/07/2018 Now 10  Continues on anticoagulation for DVT xarelto expensive, in the donut hole  Weight has stabilized, though her numbers indicate 2 pound weight loss  EKG personally reviewed by myself on todays visit Shows sinus tachycardia rate 83 bpm no significant ST or T wave changes  V3 lead likely placed to far to the right causing poor R wave progression  Other past medical history reviewed MRI: Lobulated multi-cystic lesion in the left posterior frontal subcortical white matter measuring up to 13 mm. No reduced diffusion, blood products, or enhancement. Given additional small infarcts in the brain, subacute infarction is suspected. Some differential considerations include cystic CNS Small chronic infarcts in the bilateral cerebellar  hemispheres.  Hospitalization for  melena- GI bleed. Hemoglobin drop noted from 10.5 to7.3 on admission. She received transfusion x2 EGD with nonbleeding esophagitis noted.   Colonoscopy done showing moderate diverticulosis with no evidence of bleeding ,  nonbleeding internal hemorrhoids    left lower extremity DVT Xarelto restarted at discharge.   It was felt if she had recurrent bleeding that IVC filter would be placed  stroke-MRI brain  showing white matter changes concerning for subacute infarcts, cystic lesion/metastasis  Keppra was initiated but stopped secondary to side effects  On prior clinic visit she reported 4 episodes of syncope: 1 episode july 18th, up on ladder in the heat, lightheaded, near-syncope 2nd time, 2 weeks later in kitchen, had been baking, got dizzy, sat on the floor,  Near syncope 3rd, coming up back steps, near syncope Sitting and drinking tea had episode of near syncope 4rth time Friday evening, end of Aug, heading into bathroom, syncope, family caught her     PMH:   has a past medical history of Cancer of lower lobe of right lung ([place]) (05/07/2015), COPD (chronic obstructive pulmonary disease) ([place]), Non-small cell lung cancer ([place]), and Pneumonia (04/2015).  PSH:    Past Surgical History:  Procedure Laterality Date  . ABDOMINAL HYSTERECTOMY    . COLONOSCOPY WITH PROPOFOL N/A 06/09/2018   Procedure: COLONOSCOPY WITH PROPOFOL;  Surgeon: Toledo, Benay Pike, MD;  Location: [facility];  Service: Gastroenterology;  Laterality: N/A;  . ELBOW SURGERY Right 1995  . ELECTROMAGNETIC NAVIGATION BROCHOSCOPY N/A 10/25/2017   Procedure: ELECTROMAGNETIC NAVIGATION BRONCHOSCOPY;  Surgeon: [name], MD;  Location: [facility];  Service: Cardiopulmonary;  Laterality: N/A;  . ESOPHAGOGASTRODUODENOSCOPY N/A 06/08/2018   Procedure: ESOPHAGOGASTRODUODENOSCOPY (EGD);  Surgeon: Toledo, Benay Pike, MD;  Location: [facility];  Service: Gastroenterology;  Laterality: N/A;   . PORTACATH PLACEMENT Right 05/10/2015   Procedure: INSERTION PORT-A-CATH;  Surgeon: Nestor Lewandowsky, MD;  Location: [facility];  Service: General;  Laterality: Right;  Marland Kitchen VIDEO ASSISTED THORACOSCOPY (VATS)/THOROCOTOMY Right 08/18/2015   Procedure: VIDEO ASSISTED THORACOSCOPY (VATS)/THOROCOTOMY;  Surgeon: Nestor Lewandowsky, MD;  Location: [facility];  Service: General;  Laterality: Right;    Current Outpatient Medications  Medication Sig Dispense Refill  . albuterol (PROVENTIL HFA;VENTOLIN HFA) 108 (90 Base) MCG/ACT inhaler Inhale 1 puff into the lungs every 6 (six) hours as needed for wheezing or shortness of breath.    . Cholecalciferol (VITAMIN D3) 1000 units CAPS Take by mouth 2 (two) times daily.    . Fluticasone-Salmeterol (ADVAIR DISKUS) 250-50 MCG/DOSE AEPB Inhale 1 puff into the lungs every 12 (twelve) hours.     Marland Kitchen ipratropium-albuterol (DUONEB) 0.5-2.5 (3) MG/3ML SOLN TAKE 3 MLS BY NEBULIZATION EVERY 4 (FOUR) HOURS AS NEEDED. 360 mL 3  . midodrine (PROAMATINE) 10 MG tablet Take 1 tablet (10 mg total) by mouth 3 (three) times daily as needed (As needed for Systolic less than 403). 270 tablet 3  . potassium chloride SA (K-DUR,KLOR-CON) 20 MEQ tablet 1 pill twice a day 40 tablet 3  . rivaroxaban (XARELTO) 20 MG TABS tablet Take 1 tablet (20 mg total) by mouth daily with supper. 30 tablet 1  . venlafaxine XR (EFFEXOR-XR) 37.5 MG 24 hr capsule Take 37.5 mg by mouth daily with breakfast.     . vitamin B-12 (CYANOCOBALAMIN) 1000 MCG tablet Take 1,000 mcg by mouth daily.     No current facility-administered medications for this visit.    Facility-Administered Medications Ordered in Other Visits  Medication Dose Route Frequency Provider Last Rate Last Dose  . sodium chloride flush (NS) 0.9 % injection 10 mL  10 mL Intravenous PRN Cammie Sickle, MD         Allergies:   Keppra [levetiracetam] and Lyrica [pregabalin]   Social History:  The patient  reports that she has been smoking cigarettes.  She has a 20.00 pack-year smoking history. She has never used smokeless tobacco. She reports that she drank about 2.0 - 4.0 standard drinks of alcohol per week. She reports that she does not use drugs.   Family History:   family history includes Lung cancer (age of onset: 57) in her father; Stroke in her mother.    Review of Systems: Review of Systems  Constitutional: Negative.   Respiratory: Negative.   Cardiovascular: Negative.   Gastrointestinal: Negative.   Musculoskeletal: Negative.   Neurological: Negative.   Psychiatric/Behavioral: Negative.   All other systems reviewed and are negative.    PHYSICAL EXAM: VS:  BP 118/68   Pulse 83   Ht 5\' 1"  (1.549 m)   Wt 94 lb (42.6 kg)   SpO2 98%   BMI 17.76 kg/m  , BMI Body mass index is 17.76 kg/m. Constitutional:  oriented to person, place, and time. No distress.  HENT:  Head: Normocephalic and atraumatic.  Eyes:  no discharge. No scleral icterus.  Neck: Normal range of motion. Neck supple. No JVD present.  Cardiovascular: Normal rate, regular rhythm, normal heart sounds and intact distal pulses. Exam reveals no gallop and no friction rub. No edema No murmur heard. Pulmonary/Chest: Effort normal and breath sounds normal. No stridor. No respiratory distress.  no wheezes.  no rales.  no tenderness.  Abdominal: Soft.  no distension.  no tenderness.  Musculoskeletal: Normal range of motion.  no  tenderness or deformity.  Neurological:  normal muscle tone. Coordination normal. No atrophy Skin: Skin is warm and dry. No rash noted. not diaphoretic.  Psychiatric:  normal mood and affect. behavior is normal. Thought content normal.    Recent Labs: 06/21/2018: TSH 0.597 06/28/2018: Magnesium 2.0 07/03/2018: ALT 11; Hemoglobin 10.1; Platelets 565 07/10/2018: BUN 12; Creatinine, Ser 0.71; Potassium 4.6; Sodium 134    Lipid Panel Lab Results  Component Value Date   CHOL 134 05/25/2018   HDL 56 05/25/2018   LDLCALC 69 05/25/2018    TRIG 43 05/25/2018      Wt Readings from Last 3 Encounters:  07/30/18 94 lb (42.6 kg)  07/03/18 97 lb (44 kg)  06/21/18 96 lb (43.5 kg)     ASSESSMENT AND PLAN:  Syncope, unspecified syncope type - Plan: EKG 12-Lead Recommend she hold the midodrine Not having any significant symptoms on blood pressure appears to have stabilized Off Florinef following hypokalemia episodes through much of September Denies any recurrent near syncope or syncope  Orthostasis Weight still trending down per our notes Overall feels well low Taking midodrine sporadically sometimes 2 at a time, took 2 last night for some reason Likely of little benefit to her, recommend she hold the midodrine take this only as needed -She will closely monitor orthostatic numbers at home We did suggest if she takes midodrine to take this in the morning and noon, half pill up to 1 whole pill but avoid taking this before dinner  Malignant neoplasm of lung, unspecified laterality, unspecified part of lung ([place]) Managed by oncology  not undergoing any active treatment Cancer on the right lower lobe Had 3 cycles of chemotherapy then resection of lung mass Started on carboplatinum and Taxol Radiation.  Chemo therapy put on hold secondary to progressive neuropathy She has close follow-up with oncology  Cerebrovascular accident (CVA), unspecified mechanism ([place]) Seen on MRI scan, etiology unclear On Xarelto Followed by neurology  Chronic deep vein thrombosis (DVT) of distal vein of lower extremity, unspecified laterality ([place]) On xarelto Managed by oncology hematology High risk of recurrent DVT  Gastrointestinal hemorrhage, unspecified gastrointestinal hemorrhage type Recent hospitalization for GI bleed, EGD and colonoscopy Currently is not having any active bleeding On xarelto even prior history of DVT  Disposition:   F/U as needed   Total encounter time more than 25 minutes  Greater than 50% was spent in  counseling and coordination of care with the patient   Orders Placed This Encounter  Procedures  . Basic metabolic panel  . EKG 12-Lead     Signed, Esmond Plants, M.D., Ph.D. 07/30/2018  [place], [place]

## 2018-07-30 ENCOUNTER — Encounter: Payer: Self-pay | Admitting: Cardiovascular Disease

## 2018-07-30 ENCOUNTER — Ambulatory Visit (INDEPENDENT_AMBULATORY_CARE_PROVIDER_SITE_OTHER): Payer: PPO | Admitting: Cardiovascular Disease

## 2018-07-30 VITALS — BP 118/68 | HR 83 | Ht 61.0 in | Wt 94.0 lb

## 2018-07-30 DIAGNOSIS — I951 Orthostatic hypotension: Secondary | ICD-10-CM

## 2018-07-30 DIAGNOSIS — K922 Gastrointestinal hemorrhage, unspecified: Secondary | ICD-10-CM | POA: Diagnosis not present

## 2018-07-30 DIAGNOSIS — C3490 Malignant neoplasm of unspecified part of unspecified bronchus or lung: Secondary | ICD-10-CM

## 2018-07-30 DIAGNOSIS — I825Z9 Chronic embolism and thrombosis of unspecified deep veins of unspecified distal lower extremity: Secondary | ICD-10-CM | POA: Diagnosis not present

## 2018-07-30 DIAGNOSIS — R55 Syncope and collapse: Secondary | ICD-10-CM

## 2018-07-30 DIAGNOSIS — I639 Cerebral infarction, unspecified: Secondary | ICD-10-CM

## 2018-07-30 NOTE — Patient Instructions (Addendum)
Medication Instructions:   Take the midodrine 1/2 pill up to a whole only as needed  In the morning and/or noon for dizzy spells  If you need a refill on your cardiac medications before your next appointment, please call your pharmacy.    Lab work: We will check you potassium today BMP   If you have labs (blood work) drawn today and your tests are completely normal, you will receive your results only by: Marland Kitchen MyChart Message (if you have MyChart) OR . A paper copy in the mail If you have any lab test that is abnormal or we need to change your treatment, we will call you to review the results.   Testing/Procedures: No new testing needed   Follow-Up: At [facility], you and your health needs are our priority.  As part of our continuing mission to provide you with exceptional heart care, we have created designated Provider Care Teams.  These Care Teams include your primary Cardiologist (physician) and Advanced Practice Providers (APPs -  Physician Assistants and Nurse Practitioners) who all work together to provide you with the care you need, when you need it.  . You will need a follow up appointment as needed .   Please call our office 2 months in advance to schedule this appointment.    . Providers on your designated Care Team:   . Murray Hodgkins, NP . Christell Faith, PA-C . Marrianne Mood, PA-C  Any Other Special Instructions Will Be Listed Below (If Applicable).  For educational health videos Log in to : [email or web address] Or : [email or web address], password : [email or web address]

## 2018-07-31 LAB — BASIC METABOLIC PANEL
BUN/Creatinine Ratio: 18 (ref 12–28)
BUN: 11 mg/dL (ref 8–27)
CALCIUM: 9.1 mg/dL (ref 8.7–10.3)
CHLORIDE: 101 mmol/L (ref 96–106)
CO2: 22 mmol/L (ref 20–29)
Creatinine, Ser: 0.61 mg/dL (ref 0.57–1.00)
GFR calc Af Amer: 112 mL/min/{1.73_m2} (ref >59)
GFR calc non Af Amer: 97 mL/min/{1.73_m2} (ref >59)
GLUCOSE: 84 mg/dL (ref 65–99)
POTASSIUM: 4.7 mmol/L (ref 3.5–5.2)
SODIUM: 137 mmol/L (ref 134–144)

## 2018-08-02 ENCOUNTER — Other Ambulatory Visit: Payer: Self-pay | Admitting: Internal Medicine

## 2018-08-02 DIAGNOSIS — C3411 Malignant neoplasm of upper lobe, right bronchus or lung: Secondary | ICD-10-CM

## 2018-08-05 ENCOUNTER — Telehealth: Payer: Self-pay | Admitting: *Deleted

## 2018-08-05 MED ORDER — POTASSIUM CHLORIDE CRYS ER 20 MEQ PO TBCR: 30.0000 meq | EXTENDED_RELEASE_TABLET | Freq: Every day | ORAL | 3 refills | Status: DC

## 2018-08-05 NOTE — Telephone Encounter (Signed)
Results called to pt. Pt verbalized understanding of results and to decrease potassium to 1.5 tablets( 30 meq) once a day. Med list updated.

## 2018-08-05 NOTE — Telephone Encounter (Signed)
-----   Message from Minna Merritts, MD sent at 08/04/2018  8:13 PM EST ----- Potassium in normal range Could probably cut back to 1 1/2 pills a day

## 2018-08-07 ENCOUNTER — Inpatient Hospital Stay (HOSPITAL_BASED_OUTPATIENT_CLINIC_OR_DEPARTMENT_OTHER): Payer: PPO | Admitting: Internal Medicine

## 2018-08-07 ENCOUNTER — Inpatient Hospital Stay: Payer: PPO | Attending: Internal Medicine

## 2018-08-07 VITALS — BP 148/82 | HR 84 | Temp 97.8°F | Resp 16 | Wt 98.0 lb

## 2018-08-07 DIAGNOSIS — D649 Anemia, unspecified: Secondary | ICD-10-CM

## 2018-08-07 DIAGNOSIS — C3492 Malignant neoplasm of unspecified part of left bronchus or lung: Secondary | ICD-10-CM | POA: Insufficient documentation

## 2018-08-07 DIAGNOSIS — G939 Disorder of brain, unspecified: Secondary | ICD-10-CM | POA: Diagnosis not present

## 2018-08-07 DIAGNOSIS — Z8673 Personal history of transient ischemic attack (TIA), and cerebral infarction without residual deficits: Secondary | ICD-10-CM | POA: Insufficient documentation

## 2018-08-07 DIAGNOSIS — R040 Epistaxis: Secondary | ICD-10-CM | POA: Diagnosis not present

## 2018-08-07 DIAGNOSIS — I82402 Acute embolism and thrombosis of unspecified deep veins of left lower extremity: Secondary | ICD-10-CM

## 2018-08-07 DIAGNOSIS — Z720 Tobacco use: Secondary | ICD-10-CM

## 2018-08-07 DIAGNOSIS — C3411 Malignant neoplasm of upper lobe, right bronchus or lung: Secondary | ICD-10-CM | POA: Diagnosis not present

## 2018-08-07 DIAGNOSIS — R5383 Other fatigue: Secondary | ICD-10-CM

## 2018-08-07 LAB — BASIC METABOLIC PANEL
ANION GAP: 6 (ref 5–15)
BUN: 14 mg/dL (ref 8–23)
CALCIUM: 9.4 mg/dL (ref 8.9–10.3)
CO2: 25 mmol/L (ref 22–32)
CREATININE: 0.53 mg/dL (ref 0.44–1.00)
Chloride: 103 mmol/L (ref 98–111)
GFR calc Af Amer: >60 mL/min (ref >60)
GLUCOSE: 100 mg/dL — AB (ref 70–99)
Potassium: 3.8 mmol/L (ref 3.5–5.1)
Sodium: 134 mmol/L — ABNORMAL LOW (ref 135–145)

## 2018-08-07 LAB — CBC WITH DIFFERENTIAL/PLATELET
Abs Immature Granulocytes: 0.02 10*3/uL (ref 0.00–0.07)
BASOS PCT: 1 %
Basophils Absolute: 0.1 10*3/uL (ref 0.0–0.1)
EOS ABS: 0.1 10*3/uL (ref 0.0–0.5)
Eosinophils Relative: 2 %
HEMATOCRIT: 29.6 % — AB (ref 36.0–46.0)
Hemoglobin: 9.1 g/dL — ABNORMAL LOW (ref 12.0–15.0)
IMMATURE GRANULOCYTES: 0 %
Lymphocytes Relative: 19 %
Lymphs Abs: 1.3 10*3/uL (ref 0.7–4.0)
MCH: 28.1 pg (ref 26.0–34.0)
MCHC: 30.7 g/dL (ref 30.0–36.0)
MCV: 91.4 fL (ref 80.0–100.0)
MONO ABS: 0.6 10*3/uL (ref 0.1–1.0)
Monocytes Relative: 9 %
NEUTROS ABS: 4.7 10*3/uL (ref 1.7–7.7)
NEUTROS PCT: 69 %
PLATELETS: 470 10*3/uL — AB (ref 150–400)
RBC: 3.24 MIL/uL — AB (ref 3.87–5.11)
RDW: 14.7 % (ref 11.5–15.5)
WBC: 6.9 10*3/uL (ref 4.0–10.5)
nRBC: 0 % (ref 0.0–0.2)

## 2018-08-07 NOTE — Assessment & Plan Note (Signed)
#   Right upper lobe stage I lung cancer; s/p feb February 2019 SBRT; PET scan April 01, 2018 also suggestive of radiation changes; less likely recurrence. STABLE.   # Left lung stage III lung cancer-STABLE  # Right sided epistaxis- last night; mositeruier/ afrin if needed; if worse- recommend ENT eval.   # Aug 2019 left lower extremity below the knee DVT-on Xarelto.  Improved.[See discussion below]; Hb-9-10.  Patient will need anticoagulation for 3 months.  However likely need continue Xarelto for her history of strokes [see below].  # Left frontal 13 mm cystic lesion incidental finding; follow-up with [facility] neurosurgery.  Recommend MRI in 2 to 3 months; await neurology evaluation.   #August 2019 left lower extremity below the knee DVT-on Xarelto.  Improved.[See discussion below]; Hb-9-10.   # Bilateral cerebellar subacute infarcts-question symptomatic.  On Xarelto. Awaiting neurology evaluation on 11/19- Dr.Potter.   #Anemia-question etiology [no obvious evidence of GI bleed status post recent EGD colonoscopy]; hemoglobin stable 9-10. Monitor for now.    # Recurrent severe hypokalemia- sec to Florinef; resolved.   # DISPOSITION:  #  follow up in 1 month/labs- cbc/bmp/iron studies/ferritin- Dr.B  Cc; Hedrick/Gollan

## 2018-08-07 NOTE — Progress Notes (Signed)
[facility] OFFICE PROGRESS NOTE  Patient Care Team: Maryland Pink, MD as PCP - General (Family Medicine) Cammie Sickle, MD as Medical Oncologist (Medical Oncology) Rockey Situ, Kathlene November, MD as Consulting Physician (Cardiology) Efrain Sella, MD as Consulting Physician (Gastroenterology) Noreene Filbert, MD as Referring Physician (Radiation Oncology)  Cancer Staging Malignant neoplasm of lung [facility]) Staging form: Lung, AJCC 7th Edition - Clinical: Stage IIIA (T3, N2, M0) - Signed by Forest Gleason, MD on 05/07/2015    Oncology History   # Squamous cell carcinoma of right LOWER LOBE OF lung stage IIIA based on PET scan Biopsy of the (August of 2016). 2. After 3 cycles of chemotherapy patient underwent resection of lung mass (November, 2016) ypT2B ypN0 [Dr.Oaks] status post right lower lobectomy with a 3. She was started on carboplatinum and Taxol on a weekly basis and radiation therapy from January of 2017 4. After 2 cycles of carboplatinum patient had neuropathy grade 2 interfering with work so chemotherapy was put on hold and radiation was continued (January 19th, 2017) 5. Chemotherapy was discontinued because of progressive neuropathy. Patient is finishing of radiation therapy on November 19, 2015  # NOV 2017- CT- 70mm right hilar LN; PET- Feb 2018- NED.   # DEC-JAN 2019- RUL Lung nodule [s/p Bronch; Dr.Kasa- non-diagnostic] SBRT Feb 2019  # AUG 2019- R LE DVT [xarelto];Syncope- MRI- 13 mm cystic leision/asymptomatic/[facility]; Bil subacute cerebellar stroke;  AUG 2019 - GIB/anemia [EGD/colo- Dr.Toledo; NEG];   DIAGNOSIS: RLL stage III lung ca; RUL stage I   GOALS: curative  CURRENT/MOST RECENT THERAPY : Surveillaince      Malignant neoplasm of lung (HCC)    Malignant neoplasm of right upper lobe of lung ([place])      INTERVAL HISTORY:  Claudia Chavez 62 y.o.  female pleasant patient above history of stage III lung cancer; and also  stage I right upper lobe status post radiation February 2019 is here for follow-up.  In the interim patient has not been hospitalized.  Patient has been taken off Florinef-with significant improvement of hypokalemia.  Continues to complain mild fatigue.  Otherwise no significant shortness of breath or cough.  Mild intermittent dizziness.  No falls.  She is awaiting to see neurology given history of strokes.  Review of Systems  Constitutional: Positive for malaise/fatigue. Negative for chills, diaphoresis, fever and weight loss.  HENT: Negative for nosebleeds and sore throat.   Eyes: Negative for double vision.  Respiratory: Negative for cough, hemoptysis, sputum production, shortness of breath and wheezing.   Cardiovascular: Negative for chest pain, palpitations, orthopnea and leg swelling.  Gastrointestinal: Negative for abdominal pain, blood in stool, constipation, diarrhea, heartburn, melena, nausea and vomiting.  Genitourinary: Negative for dysuria, frequency and urgency.  Musculoskeletal: Negative for back pain and joint pain.  Skin: Negative.  Negative for itching and rash.  Neurological: Positive for dizziness. Negative for tingling, focal weakness, weakness and headaches.  Endo/Heme/Allergies: Does not bruise/bleed easily.  Psychiatric/Behavioral: Negative for depression. The patient is not nervous/anxious and does not have insomnia.       PAST MEDICAL HISTORY :  Past Medical History:  Diagnosis Date  . Cancer of lower lobe of right lung ([place]) 05/07/2015  . COPD (chronic obstructive pulmonary disease) ([place])   . Non-small cell lung cancer ([place])   . Pneumonia 04/2015    PAST SURGICAL HISTORY :   Past Surgical History:  Procedure Laterality Date  . ABDOMINAL HYSTERECTOMY    . COLONOSCOPY WITH PROPOFOL N/A  06/09/2018   Procedure: COLONOSCOPY WITH PROPOFOL;  Surgeon: Toledo, Benay Pike, MD;  Location: [facility] ENDOSCOPY;  Service: Gastroenterology;  Laterality: N/A;  . ELBOW SURGERY  Right 1995  . ELECTROMAGNETIC NAVIGATION BROCHOSCOPY N/A 10/25/2017   Procedure: ELECTROMAGNETIC NAVIGATION BRONCHOSCOPY;  Surgeon: [name], MD;  Location: [facility] ORS;  Service: Cardiopulmonary;  Laterality: N/A;  . ESOPHAGOGASTRODUODENOSCOPY N/A 06/08/2018   Procedure: ESOPHAGOGASTRODUODENOSCOPY (EGD);  Surgeon: Toledo, Benay Pike, MD;  Location: [facility] ENDOSCOPY;  Service: Gastroenterology;  Laterality: N/A;  . PORTACATH PLACEMENT Right 05/10/2015   Procedure: INSERTION PORT-A-CATH;  Surgeon: Nestor Lewandowsky, MD;  Location: [facility] ORS;  Service: General;  Laterality: Right;  Marland Kitchen VIDEO ASSISTED THORACOSCOPY (VATS)/THOROCOTOMY Right 08/18/2015   Procedure: VIDEO ASSISTED THORACOSCOPY (VATS)/THOROCOTOMY;  Surgeon: Nestor Lewandowsky, MD;  Location: [facility] ORS;  Service: General;  Laterality: Right;    FAMILY HISTORY :   Family History  Problem Relation Age of Onset  . Stroke Mother   . Lung cancer Father 18    SOCIAL HISTORY:   Social History   Tobacco Use  . Smoking status: Current Every Day Smoker    Packs/day: 0.50    Years: 40.00    Pack years: 20.00    Types: Cigarettes  . Smokeless tobacco: Never Used  . Tobacco comment: "1 pk or less per day"  Substance Use Topics  . Alcohol use: Not Currently    Alcohol/week: 2.0 - 4.0 standard drinks    Types: 2 - 4 Cans of beer per week    Comment: beer-occasionally  . Drug use: No    ALLERGIES:  is allergic to keppra [levetiracetam] and lyrica [pregabalin].  MEDICATIONS:  Current Outpatient Medications  Medication Sig Dispense Refill  . albuterol (PROVENTIL HFA;VENTOLIN HFA) 108 (90 Base) MCG/ACT inhaler Inhale 1 puff into the lungs every 6 (six) hours as needed for wheezing or shortness of breath.    . Cholecalciferol (VITAMIN D3) 1000 units CAPS Take by mouth 2 (two) times daily.    . Fluticasone-Salmeterol (ADVAIR DISKUS) 250-50 MCG/DOSE AEPB Inhale 1 puff into the lungs every 12 (twelve) hours.     Marland Kitchen ipratropium-albuterol (DUONEB) 0.5-2.5 (3)  MG/3ML SOLN TAKE 3 MLS BY NEBULIZATION EVERY 4 (FOUR) HOURS AS NEEDED. 360 mL 3  . midodrine (PROAMATINE) 10 MG tablet Take 1 tablet (10 mg total) by mouth 3 (three) times daily as needed (As needed for Systolic less than 161). 270 tablet 3  . potassium chloride SA (K-DUR,KLOR-CON) 20 MEQ tablet Take 1.5 tablets (30 mEq total) by mouth daily. 40 tablet 3  . rivaroxaban (XARELTO) 20 MG TABS tablet Take 1 tablet (20 mg total) by mouth daily with supper. 30 tablet 1  . venlafaxine XR (EFFEXOR-XR) 37.5 MG 24 hr capsule Take 37.5 mg by mouth daily with breakfast.     . vitamin B-12 (CYANOCOBALAMIN) 1000 MCG tablet Take 1,000 mcg by mouth daily.     No current facility-administered medications for this visit.    Facility-Administered Medications Ordered in Other Visits  Medication Dose Route Frequency Provider Last Rate Last Dose  . sodium chloride flush (NS) 0.9 % injection 10 mL  10 mL Intravenous PRN Cammie Sickle, MD        PHYSICAL EXAMINATION: ECOG PERFORMANCE STATUS: 1 - Symptomatic but completely ambulatory  BP (!) 148/82 (BP Location: Left Arm)   Pulse 84   Temp 97.8 F (36.6 C) (Tympanic)   Resp 16   Wt 98 lb (44.5 kg)   BMI 18.52 kg/m   [employer]  08/07/18 0953  Weight: 98 lb (44.5 kg)    Physical Exam  Constitutional: She is oriented to person, place, and time.  Thin built Caucasian female patient.  She is alone.  Appears to have lost weight.  HENT:  Head: Normocephalic and atraumatic.  Mouth/Throat: Oropharynx is clear and moist. No oropharyngeal exudate.  Eyes: Pupils are equal, round, and reactive to light.  Neck: Normal range of motion. Neck supple.  Cardiovascular: Normal rate and regular rhythm.  Pulmonary/Chest: No respiratory distress. She has no wheezes.  Decreased air entry bilaterally.  Abdominal: Soft. Bowel sounds are normal. She exhibits no distension and no mass. There is no tenderness. There is no rebound and no guarding.  Musculoskeletal:  Normal range of motion. She exhibits no edema or tenderness.  Neurological: She is alert and oriented to person, place, and time.  Skin: Skin is warm.  Psychiatric: Affect normal.       LABORATORY DATA:  I have reviewed the data as listed    Component Value Date/Time   NA 134 (L) 08/07/2018 0914   NA 137 07/30/2018 1245   K 3.8 08/07/2018 0914   CL 103 08/07/2018 0914   CO2 25 08/07/2018 0914   GLUCOSE 100 (H) 08/07/2018 0914   BUN 14 08/07/2018 0914   BUN 11 07/30/2018 1245   CREATININE 0.53 08/07/2018 0914   CALCIUM 9.4 08/07/2018 0914   PROT 7.0 07/03/2018 0940   ALBUMIN 2.6 (L) 07/03/2018 0940   AST 19 07/03/2018 0940   ALT 11 07/03/2018 0940   ALKPHOS 129 (H) 07/03/2018 0940   BILITOT 0.2 (L) 07/03/2018 0940   GFRNONAA >60 08/07/2018 0914   GFRAA >60 08/07/2018 0914    No results found for: SPEP, UPEP  Lab Results  Component Value Date   WBC 6.9 08/07/2018   NEUTROABS 4.7 08/07/2018   HGB 9.1 (L) 08/07/2018   HCT 29.6 (L) 08/07/2018   MCV 91.4 08/07/2018   PLT 470 (H) 08/07/2018      Chemistry      Component Value Date/Time   NA 134 (L) 08/07/2018 0914   NA 137 07/30/2018 1245   K 3.8 08/07/2018 0914   CL 103 08/07/2018 0914   CO2 25 08/07/2018 0914   BUN 14 08/07/2018 0914   BUN 11 07/30/2018 1245   CREATININE 0.53 08/07/2018 0914      Component Value Date/Time   CALCIUM 9.4 08/07/2018 0914   ALKPHOS 129 (H) 07/03/2018 0940   AST 19 07/03/2018 0940   ALT 11 07/03/2018 0940   BILITOT 0.2 (L) 07/03/2018 0940       RADIOGRAPHIC STUDIES: I have personally reviewed the radiological images as listed and agreed with the findings in the report. No results found.   ASSESSMENT & PLAN:  Malignant neoplasm of right upper lobe of lung [facility]) # Right upper lobe stage I lung cancer; s/p feb February 2019 SBRT; PET scan April 01, 2018 also suggestive of radiation changes; less likely recurrence. STABLE.   # Left lung stage III lung cancer-STABLE  #  Right sided epistaxis- last night; mositeruier/ afrin if needed; if worse- recommend ENT eval.   # Aug 2019 left lower extremity below the knee DVT-on Xarelto.  Improved.[See discussion below]; Hb-9-10.  Patient will need anticoagulation for 3 months.  However likely need continue Xarelto for her history of strokes [see below].  # Left frontal 13 mm cystic lesion incidental finding; follow-up with [facility] neurosurgery.  Recommend MRI in 2 to 3 months; await neurology  evaluation.   #August 2019 left lower extremity below the knee DVT-on Xarelto.  Improved.[See discussion below]; Hb-9-10.   # Bilateral cerebellar subacute infarcts-question symptomatic.  On Xarelto. Awaiting neurology evaluation on 11/19- Dr.Potter.   #Anemia-question etiology [no obvious evidence of GI bleed status post recent EGD colonoscopy]; hemoglobin stable 9-10. Monitor for now.    # Recurrent severe hypokalemia- sec to Florinef; resolved.   # DISPOSITION:  #  follow up in 1 month/labs- cbc/bmp/iron studies/ferritin- Dr.B  Cc; Hedrick/Gollan   No orders of the defined types were placed in this encounter.  All questions were answered. The patient knows to call the clinic with any problems, questions or concerns.      Cammie Sickle, MD 08/13/2018 1:26 PM

## 2018-08-20 ENCOUNTER — Other Ambulatory Visit: Payer: Self-pay | Admitting: Acute Care

## 2018-08-20 ENCOUNTER — Ambulatory Visit
Admission: RE | Admit: 2018-08-20 | Discharge: 2018-08-20 | Disposition: A | Discharge status: Home / Self Care | Payer: PPO | Source: Ambulatory Visit | Attending: Acute Care | Admitting: Acute Care

## 2018-08-20 DIAGNOSIS — G9389 Other specified disorders of brain: Secondary | ICD-10-CM | POA: Insufficient documentation

## 2018-08-20 DIAGNOSIS — I6782 Cerebral ischemia: Secondary | ICD-10-CM | POA: Diagnosis not present

## 2018-08-20 DIAGNOSIS — G939 Disorder of brain, unspecified: Secondary | ICD-10-CM | POA: Diagnosis not present

## 2018-08-20 DIAGNOSIS — Z8673 Personal history of transient ischemic attack (TIA), and cerebral infarction without residual deficits: Secondary | ICD-10-CM | POA: Insufficient documentation

## 2018-08-20 MED ORDER — GADOBUTROL 1 MMOL/ML IV SOLN
4.5000 mL | Freq: Once | INTRAVENOUS | Status: AC | PRN
  Administered 2018-08-20: 4.5 mL via INTRAVENOUS

## 2018-08-26 DIAGNOSIS — J40 Bronchitis, not specified as acute or chronic: Secondary | ICD-10-CM | POA: Diagnosis not present

## 2018-09-03 ENCOUNTER — Other Ambulatory Visit: Payer: Self-pay | Admitting: *Deleted

## 2018-09-03 DIAGNOSIS — D649 Anemia, unspecified: Secondary | ICD-10-CM

## 2018-09-04 ENCOUNTER — Other Ambulatory Visit: Payer: Self-pay

## 2018-09-04 ENCOUNTER — Ambulatory Visit
Admission: RE | Admit: 2018-09-04 | Discharge: 2018-09-04 | Disposition: A | Discharge status: Home / Self Care | Payer: PPO | Source: Ambulatory Visit | Attending: Internal Medicine | Admitting: Internal Medicine

## 2018-09-04 ENCOUNTER — Telehealth: Payer: Self-pay | Admitting: *Deleted

## 2018-09-04 ENCOUNTER — Inpatient Hospital Stay: Payer: PPO | Attending: Internal Medicine

## 2018-09-04 ENCOUNTER — Inpatient Hospital Stay (HOSPITAL_BASED_OUTPATIENT_CLINIC_OR_DEPARTMENT_OTHER): Payer: PPO | Admitting: Internal Medicine

## 2018-09-04 VITALS — BP 123/81 | HR 113 | Temp 98.7°F | Resp 16 | Wt 96.2 lb

## 2018-09-04 DIAGNOSIS — D649 Anemia, unspecified: Secondary | ICD-10-CM | POA: Diagnosis not present

## 2018-09-04 DIAGNOSIS — C3492 Malignant neoplasm of unspecified part of left bronchus or lung: Secondary | ICD-10-CM

## 2018-09-04 DIAGNOSIS — C3411 Malignant neoplasm of upper lobe, right bronchus or lung: Secondary | ICD-10-CM | POA: Diagnosis not present

## 2018-09-04 DIAGNOSIS — G939 Disorder of brain, unspecified: Secondary | ICD-10-CM

## 2018-09-04 DIAGNOSIS — I82402 Acute embolism and thrombosis of unspecified deep veins of left lower extremity: Secondary | ICD-10-CM | POA: Insufficient documentation

## 2018-09-04 DIAGNOSIS — J984 Other disorders of lung: Secondary | ICD-10-CM

## 2018-09-04 DIAGNOSIS — Z720 Tobacco use: Secondary | ICD-10-CM | POA: Diagnosis not present

## 2018-09-04 DIAGNOSIS — R0602 Shortness of breath: Secondary | ICD-10-CM | POA: Diagnosis not present

## 2018-09-04 DIAGNOSIS — C3431 Malignant neoplasm of lower lobe, right bronchus or lung: Secondary | ICD-10-CM | POA: Diagnosis not present

## 2018-09-04 DIAGNOSIS — R05 Cough: Secondary | ICD-10-CM

## 2018-09-04 DIAGNOSIS — J948 Other specified pleural conditions: Secondary | ICD-10-CM | POA: Insufficient documentation

## 2018-09-04 LAB — CBC WITH DIFFERENTIAL/PLATELET
Abs Immature Granulocytes: 0.04 10*3/uL (ref 0.00–0.07)
BASOS ABS: 0.1 10*3/uL (ref 0.0–0.1)
Basophils Relative: 1 %
Eosinophils Absolute: 0.1 10*3/uL (ref 0.0–0.5)
Eosinophils Relative: 1 %
HCT: 29.6 % — ABNORMAL LOW (ref 36.0–46.0)
Hemoglobin: 9.1 g/dL — ABNORMAL LOW (ref 12.0–15.0)
Immature Granulocytes: 0 %
Lymphocytes Relative: 11 %
Lymphs Abs: 1.1 10*3/uL (ref 0.7–4.0)
MCH: 26 pg (ref 26.0–34.0)
MCHC: 30.7 g/dL (ref 30.0–36.0)
MCV: 84.6 fL (ref 80.0–100.0)
Monocytes Absolute: 0.5 10*3/uL (ref 0.1–1.0)
Monocytes Relative: 6 %
NEUTROS PCT: 81 %
Neutro Abs: 7.8 10*3/uL — ABNORMAL HIGH (ref 1.7–7.7)
Platelets: 522 10*3/uL — ABNORMAL HIGH (ref 150–400)
RBC: 3.5 MIL/uL — ABNORMAL LOW (ref 3.87–5.11)
RDW: 15.2 % (ref 11.5–15.5)
WBC: 9.5 10*3/uL (ref 4.0–10.5)
nRBC: 0 % (ref 0.0–0.2)

## 2018-09-04 LAB — BASIC METABOLIC PANEL
Anion gap: 11 (ref 5–15)
BUN: 11 mg/dL (ref 8–23)
CALCIUM: 8.9 mg/dL (ref 8.9–10.3)
CO2: 25 mmol/L (ref 22–32)
Chloride: 97 mmol/L — ABNORMAL LOW (ref 98–111)
Creatinine, Ser: 0.66 mg/dL (ref 0.44–1.00)
GFR calc Af Amer: >60 mL/min (ref >60)
GFR calc non Af Amer: >60 mL/min (ref >60)
Glucose, Bld: 93 mg/dL (ref 70–99)
Potassium: 3.5 mmol/L (ref 3.5–5.1)
Sodium: 133 mmol/L — ABNORMAL LOW (ref 135–145)

## 2018-09-04 LAB — IRON AND TIBC
Iron: 12 ug/dL — ABNORMAL LOW (ref 28–170)
Saturation Ratios: 3 % — ABNORMAL LOW (ref 10.4–31.8)
TIBC: 351 ug/dL (ref 250–450)
UIBC: 339 ug/dL

## 2018-09-04 LAB — FERRITIN: Ferritin: 27 ng/mL (ref 11–307)

## 2018-09-04 MED ORDER — LEVOFLOXACIN 500 MG PO TABS: 500.0000 mg | ORAL_TABLET | Freq: Every day | ORAL | 0 refills | Status: DC

## 2018-09-04 MED ORDER — PREDNISONE 20 MG PO TABS: ORAL_TABLET | ORAL | 0 refills | Status: DC

## 2018-09-04 NOTE — Telephone Encounter (Signed)
Dr. B contacted patient with these results. CT scan has been ordered and has been scheduled for tomorrow.

## 2018-09-04 NOTE — Assessment & Plan Note (Addendum)
#   Right upper lobe stage I lung cancer; s/p feb February 2019 SBRT; PET scan April 01, 2018 also suggestive of radiation changes; less likely recurrence.  Question progression/see below  # Left lung stage III lung cancer-question progression/see below  #Worsening cough/shortness of breath-without any significant improvement post antibiotics/prednisone.  Question progression-recommend chest x-ray.  Also recommend Levaquin/prednisone.  # Aug 2019 left lower extremity below the knee DVT-on Xarelto; improved.  Patient will come off Xarelto into the month.  Transition to baby aspirin as per neurology recommendations.  # Left frontal 13 mm cystic lesion incidental finding; November 2019 brain MRI stable; defer to neurology for further evaluation/follow-up.  # Bilateral cerebellar subacute infarcts-followed by neurology.  After stopping Xarelto patient will start aspirin as per neurology.   #Anemia-question etiology [no obvious evidence of GI bleed status post recent EGD colonoscopy]; hemoglobin stable 9-10.  Monitor for now.  Stable  # DISPOSITION: # CXR today # follow up in 3 months-labs-cbc/bmp-Dr.B  Addendum: Chest x-ray-right lung opacity; new left lung opacity question.  Metastatic disease versus pneumonitis.  Discussed with the patient the results of the chest x-ray.  Recommend CT scan stat.  Cc; Hedrick/Gollan/

## 2018-09-04 NOTE — Telephone Encounter (Signed)
Called report  CLINICAL DATA:  62 year old female with cough and shortness of breath for 2-3 weeks. Treated right lung cancer.  EXAM: CHEST - 2 VIEW  COMPARISON:  Chest radiographs 05/24/2018 and earlier.  FINDINGS: Since August persistent nodular and irregular peribronchial opacity throughout much of the right lung, and new similar abnormal opacity in the left mid lung. There is a small area of loculated hydropneumothorax in the posterior superior right lower lobe region. No layering pleural effusion or other pneumothorax. Stable right chest porta cath. Stable cardiac size and mediastinal contours. Visualized tracheal air column is within normal limits. No acute osseous abnormality identified. Paucity bowel gas in the upper abdomen.  IMPRESSION: Unresolved widespread nodular and irregular opacity in the right lung, with a new small loculated hydropneumothorax in the superior right lower lobe.  Similar new reticular and nodular opacity in the left mid lung.  The appearance is suspicious for progressive infection versus disseminated metastatic disease.  Recommend repeat Chest CT (IV contrast preferred) to compare to that on 05/08/2018.  These results will be called to the ordering clinician or representative by the Radiologist Assistant, and communication documented in the PACS or zVision Dashboard.   Electronically Signed   By: Genevie Ann M.D.   On: 09/04/2018 12:26

## 2018-09-04 NOTE — Telephone Encounter (Signed)
Updated rx instructions have been re-sent to the pharmacy

## 2018-09-04 NOTE — Progress Notes (Signed)
[place] OFFICE PROGRESS NOTE  Patient Care Team: [place] Pink, MD as PCP - General (Family Medicine) Cammie Sickle, MD as Medical Oncologist (Medical Oncology) Rockey Situ, Kathlene November, MD as Consulting Physician (Cardiology) Efrain Sella, MD as Consulting Physician (Gastroenterology) Noreene Filbert, MD as Referring Physician (Radiation Oncology)  Cancer Staging Malignant neoplasm of lung [facility]) Staging form: Lung, AJCC 7th Edition - Clinical: Stage IIIA (T3, N2, M0) - Signed by Forest Gleason, MD on 05/07/2015    Oncology History   # Squamous cell carcinoma of right LOWER LOBE OF lung stage IIIA based on PET scan Biopsy of the (August of 2016). 2. After 3 cycles of chemotherapy patient underwent resection of lung mass (November, 2016) ypT2B ypN0 [Dr.Oaks] status post right lower lobectomy with a 3. She was started on carboplatinum and Taxol on a weekly basis and radiation therapy from January of 2017 4. After 2 cycles of carboplatinum patient had neuropathy grade 2 interfering with work so chemotherapy was put on hold and radiation was continued (January 19th, 2017) 5. Chemotherapy was discontinued because of progressive neuropathy. Patient is finishing of radiation therapy on November 19, 2015  # NOV 2017- CT- 61mm right hilar LN; PET- Feb 2018- NED.   # DEC-JAN 2019- RUL Lung nodule [s/p Bronch; Dr.Kasa- non-diagnostic] SBRT Feb 2019  # AUG 2019- R LE DVT [xarelto];Syncope- MRI- 13 mm cystic leision/asymptomatic/[facility]; Bil subacute cerebellar stroke;  AUG 2019 - GIB/anemia [EGD/colo- Dr.Toledo; NEG];   DIAGNOSIS: RLL stage III lung ca; RUL stage I   GOALS: curative  CURRENT/MOST RECENT THERAPY : Surveillaince      Malignant neoplasm of lung (HCC)    Malignant neoplasm of right upper lobe of lung ([place])      INTERVAL HISTORY:  Claudia Chavez 62 y.o.  female pleasant patient above history of stage III lung cancer; and also  stage I right upper lobe status post radiation February 2019 is here for follow-up.  In the interim patient was evaluated the urgent care clinic for bronchitis-like symptoms.  She has been treated with antibiotics Z-Pak prednisone; symptoms improved not complete resolved.  She has not had a chest x-ray.  In the interim patient was also evaluated by neurology; had a brain MRI.   Patient has not had any worsening headaches.  Denies any nausea vomiting.  Review of Systems  Constitutional: Positive for malaise/fatigue. Negative for chills, diaphoresis, fever and weight loss.  HENT: Negative for nosebleeds and sore throat.   Eyes: Negative for double vision.  Respiratory: Positive for cough, sputum production and shortness of breath. Negative for hemoptysis and wheezing.   Cardiovascular: Negative for chest pain, palpitations, orthopnea and leg swelling.  Gastrointestinal: Negative for abdominal pain, blood in stool, constipation, diarrhea, heartburn, melena, nausea and vomiting.  Genitourinary: Negative for dysuria, frequency and urgency.  Musculoskeletal: Negative for back pain and joint pain.  Skin: Negative.  Negative for itching and rash.  Neurological: Negative for tingling, focal weakness, weakness and headaches.  Endo/Heme/Allergies: Does not bruise/bleed easily.  Psychiatric/Behavioral: Negative for depression. The patient is not nervous/anxious and does not have insomnia.       PAST MEDICAL HISTORY :  Past Medical History:  Diagnosis Date  . Cancer of lower lobe of right lung ([place]) 05/07/2015  . COPD (chronic obstructive pulmonary disease) ([place])   . Non-small cell lung cancer ([place])   . Pneumonia 04/2015    PAST SURGICAL HISTORY :   Past Surgical History:  Procedure Laterality Date  .  ABDOMINAL HYSTERECTOMY    . COLONOSCOPY WITH PROPOFOL N/A 06/09/2018   Procedure: COLONOSCOPY WITH PROPOFOL;  Surgeon: Toledo, Benay Pike, MD;  Location: [facility] ENDOSCOPY;  Service: Gastroenterology;   Laterality: N/A;  . ELBOW SURGERY Right 1995  . ELECTROMAGNETIC NAVIGATION BROCHOSCOPY N/A 10/25/2017   Procedure: ELECTROMAGNETIC NAVIGATION BRONCHOSCOPY;  Surgeon: [name], MD;  Location: [facility] ORS;  Service: Cardiopulmonary;  Laterality: N/A;  . ESOPHAGOGASTRODUODENOSCOPY N/A 06/08/2018   Procedure: ESOPHAGOGASTRODUODENOSCOPY (EGD);  Surgeon: Toledo, Benay Pike, MD;  Location: [facility] ENDOSCOPY;  Service: Gastroenterology;  Laterality: N/A;  . PORTACATH PLACEMENT Right 05/10/2015   Procedure: INSERTION PORT-A-CATH;  Surgeon: Nestor Lewandowsky, MD;  Location: [facility] ORS;  Service: General;  Laterality: Right;  Marland Kitchen VIDEO ASSISTED THORACOSCOPY (VATS)/THOROCOTOMY Right 08/18/2015   Procedure: VIDEO ASSISTED THORACOSCOPY (VATS)/THOROCOTOMY;  Surgeon: Nestor Lewandowsky, MD;  Location: [facility] ORS;  Service: General;  Laterality: Right;    FAMILY HISTORY :   Family History  Problem Relation Age of Onset  . Stroke Mother   . Lung cancer Father 43    SOCIAL HISTORY:   Social History   Tobacco Use  . Smoking status: Current Every Day Smoker    Packs/day: 0.50    Years: 40.00    Pack years: 20.00    Types: Cigarettes  . Smokeless tobacco: Never Used  . Tobacco comment: "1 pk or less per day"  Substance Use Topics  . Alcohol use: Not Currently    Alcohol/week: 2.0 - 4.0 standard drinks    Types: 2 - 4 Cans of beer per week    Comment: beer-occasionally  . Drug use: No    ALLERGIES:  is allergic to keppra [levetiracetam] and lyrica [pregabalin].  MEDICATIONS:  Current Outpatient Medications  Medication Sig Dispense Refill  . albuterol (PROVENTIL HFA;VENTOLIN HFA) 108 (90 Base) MCG/ACT inhaler Inhale 1 puff into the lungs every 6 (six) hours as needed for wheezing or shortness of breath.    . Cholecalciferol (VITAMIN D3) 1000 units CAPS Take by mouth 2 (two) times daily.    . Fluticasone-Salmeterol (ADVAIR DISKUS) 250-50 MCG/DOSE AEPB Inhale 1 puff into the lungs every 12 (twelve) hours.     Marland Kitchen  ipratropium-albuterol (DUONEB) 0.5-2.5 (3) MG/3ML SOLN TAKE 3 MLS BY NEBULIZATION EVERY 4 (FOUR) HOURS AS NEEDED. 360 mL 3  . midodrine (PROAMATINE) 10 MG tablet Take 1 tablet (10 mg total) by mouth 3 (three) times daily as needed (As needed for Systolic less than 829). 270 tablet 3  . potassium chloride SA (K-DUR,KLOR-CON) 20 MEQ tablet Take 1.5 tablets (30 mEq total) by mouth daily. 40 tablet 3  . rivaroxaban (XARELTO) 20 MG TABS tablet Take 1 tablet (20 mg total) by mouth daily with supper. 30 tablet 1  . venlafaxine XR (EFFEXOR-XR) 37.5 MG 24 hr capsule Take 37.5 mg by mouth daily with breakfast.     . vitamin B-12 (CYANOCOBALAMIN) 1000 MCG tablet Take 1,000 mcg by mouth daily.    Marland Kitchen levofloxacin (LEVAQUIN) 500 MG tablet Take 1 tablet (500 mg total) by mouth daily. 7 tablet 0  . predniSONE (DELTASONE) 20 MG tablet Take 2 pills once a day x 2 weeks; and then one pill once a day with food. 28 tablet 0   No current facility-administered medications for this visit.    Facility-Administered Medications Ordered in Other Visits  Medication Dose Route Frequency Provider Last Rate Last Dose  . sodium chloride flush (NS) 0.9 % injection 10 mL  10 mL Intravenous PRN Cammie Sickle, MD  PHYSICAL EXAMINATION: ECOG PERFORMANCE STATUS: 1 - Symptomatic but completely ambulatory  BP 123/81 (BP Location: Left Arm, Patient Position: Sitting)   Pulse (!) 113   Temp 98.7 F (37.1 C) (Tympanic)   Resp 16   Wt 96 lb 3.2 oz (43.6 kg)   BMI 18.18 kg/m   Filed Weights   09/04/18 1025  Weight: 96 lb 3.2 oz (43.6 kg)    Physical Exam  Constitutional: She is oriented to person, place, and time.  Thin built Caucasian female patient.  She is alone.  Appears to have lost weight.  HENT:  Head: Normocephalic and atraumatic.  Mouth/Throat: Oropharynx is clear and moist. No oropharyngeal exudate.  Eyes: Pupils are equal, round, and reactive to light.  Neck: Normal range of motion. Neck supple.   Cardiovascular: Normal rate and regular rhythm.  Pulmonary/Chest: No respiratory distress. She has no wheezes.  Decreased air entry bilaterally.  Abdominal: Soft. Bowel sounds are normal. She exhibits no distension and no mass. There is no tenderness. There is no rebound and no guarding.  Musculoskeletal: Normal range of motion. She exhibits no edema or tenderness.  Neurological: She is alert and oriented to person, place, and time.  Skin: Skin is warm.  Psychiatric: Affect normal.       LABORATORY DATA:  I have reviewed the data as listed    Component Value Date/Time   NA 133 (L) 09/04/2018 1002   NA 137 07/30/2018 1245   K 3.5 09/04/2018 1002   CL 97 (L) 09/04/2018 1002   CO2 25 09/04/2018 1002   GLUCOSE 93 09/04/2018 1002   BUN 11 09/04/2018 1002   BUN 11 07/30/2018 1245   CREATININE 0.66 09/04/2018 1002   CALCIUM 8.9 09/04/2018 1002   PROT 7.0 07/03/2018 0940   ALBUMIN 2.6 (L) 07/03/2018 0940   AST 19 07/03/2018 0940   ALT 11 07/03/2018 0940   ALKPHOS 129 (H) 07/03/2018 0940   BILITOT 0.2 (L) 07/03/2018 0940   GFRNONAA >60 09/04/2018 1002   GFRAA >60 09/04/2018 1002    No results found for: SPEP, UPEP  Lab Results  Component Value Date   WBC 9.5 09/04/2018   NEUTROABS 7.8 (H) 09/04/2018   HGB 9.1 (L) 09/04/2018   HCT 29.6 (L) 09/04/2018   MCV 84.6 09/04/2018   PLT 522 (H) 09/04/2018      Chemistry      Component Value Date/Time   NA 133 (L) 09/04/2018 1002   NA 137 07/30/2018 1245   K 3.5 09/04/2018 1002   CL 97 (L) 09/04/2018 1002   CO2 25 09/04/2018 1002   BUN 11 09/04/2018 1002   BUN 11 07/30/2018 1245   CREATININE 0.66 09/04/2018 1002      Component Value Date/Time   CALCIUM 8.9 09/04/2018 1002   ALKPHOS 129 (H) 07/03/2018 0940   AST 19 07/03/2018 0940   ALT 11 07/03/2018 0940   BILITOT 0.2 (L) 07/03/2018 0940       RADIOGRAPHIC STUDIES: I have personally reviewed the radiological images as listed and agreed with the findings in the  report. Dg Chest 2 View  Result Date: 09/04/2018 CLINICAL DATA:  62 year old female with cough and shortness of breath for 2-3 weeks. Treated right lung cancer. EXAM: CHEST - 2 VIEW COMPARISON:  Chest radiographs 05/24/2018 and earlier. FINDINGS: Since August persistent nodular and irregular peribronchial opacity throughout much of the right lung, and new similar abnormal opacity in the left mid lung. There is a small area of loculated hydropneumothorax  in the posterior superior right lower lobe region. No layering pleural effusion or other pneumothorax. Stable right chest porta cath. Stable cardiac size and mediastinal contours. Visualized tracheal air column is within normal limits. No acute osseous abnormality identified. Paucity bowel gas in the upper abdomen. IMPRESSION: Unresolved widespread nodular and irregular opacity in the right lung, with a new small loculated hydropneumothorax in the superior right lower lobe. Similar new reticular and nodular opacity in the left mid lung. The appearance is suspicious for progressive infection versus disseminated metastatic disease. Recommend repeat Chest CT (IV contrast preferred) to compare to that on 05/08/2018. These results will be called to the ordering clinician or representative by the Radiologist Assistant, and communication documented in the PACS or zVision Dashboard. Electronically Signed   By: Genevie Ann M.D.   On: 09/04/2018 12:26     ASSESSMENT & PLAN:  Malignant neoplasm of right upper lobe of lung [facility]) # Right upper lobe stage I lung cancer; s/p feb February 2019 SBRT; PET scan April 01, 2018 also suggestive of radiation changes; less likely recurrence.  Question progression/see below  # Left lung stage III lung cancer-question progression/see below  #Worsening cough/shortness of breath-without any significant improvement post antibiotics/prednisone.  Question progression-recommend chest x-ray.  Also recommend Levaquin/prednisone.  # Aug 2019  left lower extremity below the knee DVT-on Xarelto; improved.  Patient will come off Xarelto into the month.  Transition to baby aspirin as per neurology recommendations.  # Left frontal 13 mm cystic lesion incidental finding; November 2019 brain MRI stable; defer to neurology for further evaluation/follow-up.  # Bilateral cerebellar subacute infarcts-followed by neurology.  After stopping Xarelto patient will start aspirin as per neurology.   #Anemia-question etiology [no obvious evidence of GI bleed status post recent EGD colonoscopy]; hemoglobin stable 9-10.  Monitor for now.  Stable  # DISPOSITION: # CXR today # follow up in 3 months-labs-cbc/bmp-Dr.B  Addendum: Chest x-ray-right lung opacity; new left lung opacity question.  Metastatic disease versus pneumonitis.  Discussed with the patient the results of the chest x-ray.  Recommend CT scan stat.  Cc; Hedrick/Gollan/   Orders Placed This Encounter  Procedures  . CT CHEST W CONTRAST    Standing Status:   Future    Standing Expiration Date:   09/05/2019    Order Specific Question:   If indicated for the ordered procedure, I authorize the administration of contrast media per Radiology protocol    Answer:   Yes    Order Specific Question:   Preferred imaging location?    Answer:   [facility]    Order Specific Question:   Radiology Contrast Protocol - do NOT remove file path    Answer:   [email or web address]    Order Specific Question:   ** REASON FOR EXAM (FREE TEXT)    Answer:   lung cancer; cough  . CBC with Differential/Platelet    Standing Status:   Future    Standing Expiration Date:   09/05/2019  . Basic metabolic panel    Standing Status:   Future    Standing Expiration Date:   09/05/2019   All questions were answered. The patient knows to call the clinic with any problems, questions or concerns.      Cammie Sickle, MD 09/04/2018 5:28 PM

## 2018-09-04 NOTE — Telephone Encounter (Signed)
Pharmacy called and states there was not enough tabs of Prednisone ordered for the instructions given. Please clarify

## 2018-09-04 NOTE — Patient Instructions (Signed)
#  Stop Xarelto in about 2 weeks. #Chest x-ray today-we will call with results.

## 2018-09-05 ENCOUNTER — Ambulatory Visit
Admission: RE | Admit: 2018-09-05 | Discharge: 2018-09-05 | Disposition: A | Discharge status: Home / Self Care | Payer: PPO | Source: Ambulatory Visit | Attending: Internal Medicine | Admitting: Internal Medicine

## 2018-09-05 DIAGNOSIS — R918 Other nonspecific abnormal finding of lung field: Secondary | ICD-10-CM | POA: Diagnosis not present

## 2018-09-05 DIAGNOSIS — C3411 Malignant neoplasm of upper lobe, right bronchus or lung: Secondary | ICD-10-CM | POA: Diagnosis not present

## 2018-09-05 MED ORDER — IOHEXOL 300 MG/ML  SOLN
75.0000 mL | Freq: Once | INTRAMUSCULAR | Status: AC | PRN
  Administered 2018-09-05: 75 mL via INTRAVENOUS

## 2018-09-06 ENCOUNTER — Telehealth: Payer: Self-pay

## 2018-09-06 ENCOUNTER — Telehealth: Payer: Self-pay | Admitting: Internal Medicine

## 2018-09-06 NOTE — Telephone Encounter (Signed)
Per Dr.Oaks add patient to schedule 09/09/18. Patient notified of appointment. Recent CT scan 09/05/18.

## 2018-09-06 NOTE — Telephone Encounter (Signed)
I spoke Dr. Mortimer Fries regarding the results of the scan.  Question bronc versus evaluation with Dr. Faith Rogue.  Would defer to Dr. Jenell Milliner regarding further work-up.  Patient will be contacted by Dr. Jenell Milliner office. Appreciate the help.   Spoke to patient of the above plan.  Patient currently on Levaquin [started on 12/04]; no fevers.  Question cough improved.   Hildred Alamin, please follow-up. Thx GB

## 2018-09-09 ENCOUNTER — Ambulatory Visit (INDEPENDENT_AMBULATORY_CARE_PROVIDER_SITE_OTHER): Payer: PPO | Admitting: Cardiothoracic Surgery

## 2018-09-09 ENCOUNTER — Other Ambulatory Visit: Payer: Self-pay

## 2018-09-09 ENCOUNTER — Encounter: Payer: Self-pay | Admitting: Cardiothoracic Surgery

## 2018-09-09 VITALS — BP 123/73 | HR 111 | Temp 97.7°F | Resp 18 | Ht 61.0 in | Wt 95.4 lb

## 2018-09-09 DIAGNOSIS — R911 Solitary pulmonary nodule: Secondary | ICD-10-CM | POA: Diagnosis not present

## 2018-09-09 NOTE — Patient Instructions (Signed)
We have sent the referral to Pulmonary. Someone from their office should contact you within 5-7 days. If you have not heard from anyone please call our office so we can check on this for you.

## 2018-09-09 NOTE — Progress Notes (Signed)
  Patient ID: Claudia Chavez, female   DOB: 1956-09-14, 62 y.o.   MRN: [id number]  HISTORY: She returns today in follow-up.  She is status post right lower lobe resection several years ago.  She has been followed in our oncology and radiation therapy departments for her lung cancer.  She did have a recurrence that was treated with SBRT.  Over the last several weeks she is experienced increasing cough with some low-grade fevers.  She was seen by her primary care physician and a chest x-ray and subsequent CT scan of been performed.  The CT scan showed a large cavitary lesion in the right upper lobe.  In addition there are multiple lesions scattered throughout the lungs that were consistent with either metastatic tumor or foci of infection.  She continues to have a cough which is nonproductive.  Occasionally she will cough up some yellow sputum but she has never had anything to suggest a drained abscess.  She has had no hemoptysis.  She initially had some low-grade fevers but has had none in the last several weeks.  She states that she feels unwell overall but has no specific problems that she can identify.  She continues to smoke about a pack of cigarettes a day.   Vitals:   09/09/18 1117  BP: 123/73  Pulse: (!) 111  Resp: 18  Temp: 97.7 F (36.5 C)  SpO2: 98%     EXAM:    Resp: Lungs are clear bilaterally.  No respiratory distress, normal effort. Heart:  Regular without murmurs Abd:  Abdomen is soft, non distended and non tender. No masses are palpable.  There is no rebound and no guarding.  Neurological: Alert and oriented to person, place, and time. Coordination normal.  Skin: Skin is warm and dry. No rash noted. No diaphoretic. No erythema. No pallor.  Psychiatric: Normal mood and affect. Normal behavior. Judgment and thought content normal.    ASSESSMENT: I have independently reviewed the patient's CT scans.  There is a large cavitary lesion in the right lung.  In addition there are  multiple lesions in the periphery of the left lung which are concerning for metastatic tumor or metastatic infection.   PLAN:   This patient has previously undergone a right lower lobectomy and now has extensive disease within the remaining portions of the right lung.  An attempt at a left-sided thoracoscopy may not be possible because she may not be able to tolerate single lung ventilation on the right.  Therefore she may require a thoracotomy on the left for any definitive biopsy purposes.  I do not think that I would attempt to drain the process in the right lung at this point in time until we can be certain that she has no evidence of metastatic disease and that her survival is reasonable in the short-term.  Therefore of asked Dr. Christel Mormon to consider a less invasive approach than thoracotomy for definitive diagnosis.  If that is possible that is what I would recommend.  If it is not possible then I could consider doing a limited thoracotomy for biopsy and diagnosis only.  This would be on the left side.  I would not operate on the right side at this point.    Nestor Lewandowsky, MD

## 2018-09-10 ENCOUNTER — Other Ambulatory Visit: Payer: Self-pay | Admitting: Internal Medicine

## 2018-09-10 DIAGNOSIS — Z8673 Personal history of transient ischemic attack (TIA), and cerebral infarction without residual deficits: Secondary | ICD-10-CM | POA: Diagnosis not present

## 2018-09-10 DIAGNOSIS — G939 Disorder of brain, unspecified: Secondary | ICD-10-CM | POA: Diagnosis not present

## 2018-09-12 ENCOUNTER — Encounter: Payer: Self-pay | Admitting: Internal Medicine

## 2018-09-12 ENCOUNTER — Ambulatory Visit (INDEPENDENT_AMBULATORY_CARE_PROVIDER_SITE_OTHER): Payer: PPO | Admitting: Internal Medicine

## 2018-09-12 ENCOUNTER — Other Ambulatory Visit: Payer: PPO

## 2018-09-12 VITALS — BP 138/80 | HR 114 | Ht 61.0 in | Wt 96.8 lb

## 2018-09-12 DIAGNOSIS — R9389 Abnormal findings on diagnostic imaging of other specified body structures: Secondary | ICD-10-CM | POA: Diagnosis not present

## 2018-09-12 DIAGNOSIS — J181 Lobar pneumonia, unspecified organism: Secondary | ICD-10-CM | POA: Diagnosis not present

## 2018-09-12 MED ORDER — LEVOFLOXACIN 750 MG PO TABS: 750.0000 mg | ORAL_TABLET | Freq: Every day | ORAL | 0 refills | Status: AC

## 2018-09-12 MED ORDER — FLUTTER DEVI: 1.0000 | Freq: Every day | 0 refills | Status: AC

## 2018-09-12 NOTE — Progress Notes (Signed)
Tumor Board Documentation  Claudia Chavez was presented by Dr Rogue Bussing at our Tumor Board on 09/12/2018, which included representatives from medical oncology, radiation oncology, surgical oncology, navigation, internal medicine, pathology, radiology, surgical, research, pulmonology.  Claudia Chavez currently presents as a current patient with history of the following treatments: adjuvant chemotherapy.  Additionally, we reviewed previous medical and familial history, history of present illness, and recent lab results along with all available histopathologic and imaging studies. The tumor board considered available treatment options and made the following recommendations: Active surveillance, Surgery patient wishes to wait until after the holidays to pursue diagnosis. Seen by Dr Mortimer Fries and Dr Genevive Bi    The following procedures/referrals were also placed: No orders of the defined types were placed in this encounter.   Clinical Trial Status: not discussed   Staging used: AJCC Stage Group  National site-specific guidelines NCCN were discussed with respect to the case.  Tumor board is a meeting of clinicians from various specialty areas who evaluate and discuss patients for whom a multidisciplinary approach is being considered. Final determinations in the plan of care are those of the provider(s). The responsibility for follow up of recommendations given during tumor board is that of the provider.   Today's extended care, comprehensive team conference, Claudia Chavez was not present for the discussion and was not examined.   Multidisciplinary Tumor Board is a multidisciplinary case peer review process.  Decisions discussed in the Multidisciplinary Tumor Board reflect the opinions of the specialists present at the conference without having examined the patient.  Ultimately, treatment and diagnostic decisions rest with the primary provider(s) and the patient.

## 2018-09-12 NOTE — Progress Notes (Signed)
Name: Claudia Chavez MRN: [id number] DOB: Nov 08, 1955     CONSULTATION DATE: .1.17.19  REFERRING MD :  Genevive Bi   STUDIES:  CT chest 1.7.19 I have Independently reviewed images of  CT chest   on 09/12/2018 Interpretation:Right upper lobe nodule is grossly stable in size 05/25/2017 but appears more solid in appearance than on 05/25/2017 and is new from 08/07/2016. Findings are worrisome for bronchogenic carcinoma.   CT chest 09/06/18 I have Independently reviewed images of   Interpretation: Right upper lobe thick-walled cavitary process measures approximately 4.2 x 2.9 cm. There is also a right apical cavitary nodule on image number 27 measuring 10.5 mm. Left Lung GGO multifocal  Large thick-walled cavitary lesion in the right lower lobe with an air-fluid level measures 7.2 x 5.7 cm.    SYNOPSIS 62 y.o. female.  Approximately 3 years ago she underwent a right lower lobectomy after induction chemotherapy.  Postoperatively she was treated with radiation therapy and chemotherapy for presumed stage IIIa SQ cell carcinoma of the lung.  RT lung upper lobe nodule was found on surveillance CT s/p ENB 1.24.19 NON-diagnostic PFT/s c/w severe COPD Fev1 55%    Chief complaint -follow-up abnormal CT chest  HISTORY OF PRESENT ILLNESS:    Patient is here for follow-up abnormal CT chest She has a productive cough with yellow phlegm over the last several months She has intermittent wheezing and shortness of breath Patient currently on prednisone and antibiotics for COPD exacerbation and multifocal pneumonia   I have reviewed the CT scans with the patient there is a progressive right lower lobe cavitary lesion with air-fluid level which suggest lung abscess and necrotizing pneumonia She also has multifocal groundglass opacifications throughout the right and left lung  At this time she is nontoxic-appearing she looks well She has no acute distress She is afebrile  She continues to  smoke She has good appetite  no significant weight loss     Smoking Assessment and Cessation Counseling   Upon further questioning, Patient smokes 1/2 ppd  I have advised patient to quit/stop smoking as soon as possible due to high risk for multiple medical problems  Patient is NOT willing to quit smoking  I have advised patient that we can assist and have options of Nicotine replacement therapy. I also advised patient on behavioral therapy and can provide oral medication therapy in conjunction with the other therapies  Follow up next Office visit  for assessment of smoking cessation  Smoking cessation counseling advised for 4 minutes      PAST MEDICAL HISTORY :   has a past medical history of Cancer of lower lobe of right lung ([place]) (05/07/2015), COPD (chronic obstructive pulmonary disease) ([place]), Non-small cell lung cancer ([place]), and Pneumonia (04/2015).  has a past surgical history that includes Abdominal hysterectomy; Portacath placement (Right, 05/10/2015); Elbow surgery (Right, 1995); Video assisted thoracoscopy (vats)/thorocotomy (Right, 08/18/2015); Electormagnetic navigation bronchoscopy (N/A, 10/25/2017); Esophagogastroduodenoscopy (N/A, 06/08/2018); and Colonoscopy with propofol (N/A, 06/09/2018). Prior to Admission medications   Medication Sig Start Date End Date Taking? Authorizing Provider  Fluticasone-Salmeterol (ADVAIR DISKUS) 250-50 MCG/DOSE AEPB INHALE 1 INHALATION INTO THE LUNGS EVERY 12 (TWELVE) HOURS. 09/05/17  Yes [provider]   Allergies  Allergen Reactions  . Keppra [Levetiracetam] Other (See Comments)    Nausea and dizzy  . Lyrica [Pregabalin] Other (See Comments)    Made patient feel very dizzy and not feel good.      Review of Systems:  Gen:  Denies  fever,  sweats, chills weigh loss  HEENT: Denies blurred vision, double vision, ear pain, eye pain, hearing loss, nose bleeds, sore throat Cardiac:  No dizziness, chest pain or heaviness, chest  tightness,edema, No JVD Resp:   No cough, -sputum production, -shortness of breath,-wheezing, -hemoptysis,  Gi: Denies swallowing difficulty, stomach pain, nausea or vomiting, diarrhea, constipation, bowel incontinence Gu:  Denies bladder incontinence, burning urine Ext:   Denies Joint pain, stiffness or swelling Skin: Denies  skin rash, easy bruising or bleeding or hives Endoc:  Denies polyuria, polydipsia , polyphagia or weight change Psych:   Denies depression, insomnia or hallucinations  Other:  All other systems negative   BP 138/80 (BP Location: Left Arm, Cuff Size: Normal)   Pulse (!) 114   Ht 5\' 1"  (1.549 m)   Wt 96 lb 12.8 oz (43.9 kg)   SpO2 98%   BMI 18.29 kg/m   Physical Examination:   GENERAL:NAD, no fevers, chills, no weakness no fatigue HEAD: Normocephalic, atraumatic.  EYES: Pupils equal, round, reactive to light. Extraocular muscles intact. No scleral icterus.  MOUTH: Moist mucosal membrane. Dentition intact. No abscess noted.  EAR, NOSE, THROAT: Clear without exudates. No external lesions.  NECK: Supple. No thyromegaly. No nodules. No JVD.  PULMONARY: CTA B/L no wheezing, rhonchi, crackles CARDIOVASCULAR: S1 and S2. Regular rate and rhythm. No murmurs, rubs, or gallops. No edema. Pedal pulses 2+ bilaterally.  GASTROINTESTINAL: Soft, nontender, nondistended. No masses. Positive bowel sounds. No hepatosplenomegaly.  MUSCULOSKELETAL: No swelling, clubbing, or edema. Range of motion full in all extremities.  NEUROLOGIC: Cranial nerves II through XII are intact. No gross focal neurological deficits. Sensation intact. Reflexes intact.  SKIN: No ulceration, lesions, rashes, or cyanosis. Skin warm and dry. Turgor intact.  PSYCHIATRIC: Mood, affect within normal limits. The patient is awake, alert and oriented x 3. Insight, judgment intact.  ALL OTHER ROS ARE NEGATIVE     ASSESSMENT / PLAN: 62 year old pleasant white female Gold stage B with severe COPD previous  history of lung cancer squamous cell carcinoma with progressive worsening of CT scans with a right lower lobe cavitary lesion with air-fluid level with multifocal opacifications bilaterally associated with groundglass opacifications with signs and symptoms of COPD exacerbation with intermittent wheezing productive cough and some shortness of breath  At this time I do believe she has a lung abscess with multifocal pneumonia and is currently on antibiotics and prednisone for COPD exacerbation  I recommend that she continue her prednisone as prescribed and will give her another week of antibiotics with Levaquin Patient is nontoxic-appearing she has a good appetite and she is not losing weight  she has no significant respiratory distress at this time   I have suggested that we undergo bronchoscopy for lavages to rule out fungal and atypical infections however patient does not want any type of procedure before Christmas  She is okay with waiting for 8 weeks for repeat CT chest to assess for interval changes    #1 abnormal CT chest consistent with lung abscess and multifocal pneumonia Continue antibiotics as per lab continue prednisone as prescribed   #2 multifocal opacities right lower lobe cavitary lesion Follow-up CT chest in 8 weeks  #3 COPD exacerbation Continue prednisone as per scribed continue antibiotics as prescribed  #4 productive cough/aggressive pulmonary toilet Recommend flutter valve 10-15 times per day and use it with nebulizers every 4 hours  #5 smoking cessation strongly advised    Patient  satisfied with Plan of action and management. All questions answered follow  up in 2 months    Taesean Reth Patricia Pesa, M.D.  [facility]  Medical Director [place] Director [facility] Cardio-Pulmonary Department

## 2018-09-12 NOTE — Addendum Note (Signed)
Addended by: Darreld Mclean on: 09/12/2018 09:39 AM   Modules accepted: Orders

## 2018-09-12 NOTE — Patient Instructions (Addendum)
Continue Levaquin for 7 more days  Flutter valve 10-15 times per day  Use with NEBULIZER every 4 hrs  CT chest in 8  Weeks ordered for Feb 12

## 2018-10-08 ENCOUNTER — Encounter
Admission: RE | Admit: 2018-10-08 | Discharge: 2018-10-08 | Disposition: A | Discharge status: Home / Self Care | Payer: PPO | Source: Ambulatory Visit | Attending: Internal Medicine | Admitting: Internal Medicine

## 2018-10-08 ENCOUNTER — Encounter: Payer: Self-pay | Admitting: *Deleted

## 2018-10-08 ENCOUNTER — Ambulatory Visit: Payer: PPO | Admitting: Internal Medicine

## 2018-10-08 ENCOUNTER — Telehealth: Payer: Self-pay

## 2018-10-08 ENCOUNTER — Other Ambulatory Visit: Payer: Self-pay

## 2018-10-08 ENCOUNTER — Encounter: Payer: Self-pay | Admitting: Internal Medicine

## 2018-10-08 VITALS — BP 122/60 | HR 105 | Ht 61.0 in | Wt 98.0 lb

## 2018-10-08 DIAGNOSIS — R05 Cough: Secondary | ICD-10-CM | POA: Diagnosis not present

## 2018-10-08 DIAGNOSIS — J181 Lobar pneumonia, unspecified organism: Secondary | ICD-10-CM | POA: Diagnosis not present

## 2018-10-08 DIAGNOSIS — R059 Cough, unspecified: Secondary | ICD-10-CM

## 2018-10-08 MED ORDER — GUAIFENESIN-CODEINE 100-10 MG/5ML PO SOLN: 5.0000 mL | ORAL | 0 refills | Status: DC | PRN

## 2018-10-08 NOTE — H&P (View-Only) (Signed)
Name: Claudia Chavez MRN: [id number] DOB: [birth year]     CONSULTATION DATE: .1.17.19  REFERRING MD :  Genevive Bi   STUDIES:  CT chest 1.7.19 I have Independently reviewed images of  CT chest   on 10/08/2018 Interpretation:Right upper lobe nodule is grossly stable in size 05/25/2017 but appears more solid in appearance than on 05/25/2017 and is new from 08/07/2016. Findings are worrisome for bronchogenic carcinoma.   CT chest 09/06/18 I have Independently reviewed images of   Interpretation: Right upper lobe thick-walled cavitary process measures approximately 4.2 x 2.9 cm. There is also a right apical cavitary nodule on image number 27 measuring 10.5 mm. Left Lung GGO multifocal  Large thick-walled cavitary lesion in the right lower lobe with an air-fluid level measures 7.2 x 5.7 cm.    SYNOPSIS 63 y.o. female.  Approximately 3 years ago she underwent a right lower lobectomy after induction chemotherapy.  Postoperatively she was treated with radiation therapy and chemotherapy for presumed stage IIIa SQ cell carcinoma of the lung.  RT lung upper lobe nodule was found on surveillance CT s/p ENB 1.24.19 NON-diagnostic PFT/s c/w severe COPD Fev1 55%    Chief complaint -follow-up abnormal CT chest  HISTORY OF PRESENT ILLNESS:   Follow up for abnormal CT chest Productive cough last week yellow sputum +fevers +chills last week Very weak, fatigued  intermittent wheezing persistent cough that's really bad   Completed prednisone and ABX for COPD exacerbation and multifocal pneumonia  Reviewed CT chest-progressive RT lower lobe cavity Air fluid level-lung abscess/necrotizing pneumonia more likely  She will need Repeat CT chest  She had REFUSED Bronchoscopy prior to Christmas But now she is ready  She is non-toxic appearing No acute distress  Stopped smoking because of terrible cough   She has good appetite  no significant weight loss    PAST MEDICAL HISTORY :   has a past medical history of Cancer of lower lobe of right lung ([place]) (05/07/2015), COPD (chronic obstructive pulmonary disease) ([place]), Non-small cell lung cancer ([place]), and Pneumonia (04/2015).  has a past surgical history that includes Abdominal hysterectomy; Portacath placement (Right, 05/10/2015); Elbow surgery (Right, 1995); Video assisted thoracoscopy (vats)/thorocotomy (Right, 08/18/2015); Electormagnetic navigation bronchoscopy (N/A, 10/25/2017); Esophagogastroduodenoscopy (N/A, 06/08/2018); and Colonoscopy with propofol (N/A, 06/09/2018). Prior to Admission medications   Medication Sig Start Date End Date Taking? Authorizing Provider  Fluticasone-Salmeterol (ADVAIR DISKUS) 250-50 MCG/DOSE AEPB INHALE 1 INHALATION INTO THE LUNGS EVERY 12 (TWELVE) HOURS. 09/05/17  Yes [provider]   Allergies  Allergen Reactions  . Keppra [Levetiracetam] Other (See Comments)    Nausea and dizzy  . Lyrica [Pregabalin] Other (See Comments)    Made patient feel very dizzy and not feel good.      Review of Systems:  Gen:  + fever, +sweats, +chills -weigh loss  HEENT: Denies blurred vision, double vision, ear pain, eye pain, hearing loss, nose bleeds, sore throat Cardiac:  No dizziness, chest pain or heaviness, chest tightness,edema, No JVD Resp:   No cough, +sputum production, +shortness of breath,+wheezing, -hemoptysis,  Gi: Denies swallowing difficulty, stomach pain, nausea or vomiting, diarrhea, constipation, bowel incontinence Gu:  Denies bladder incontinence, burning urine Ext:   Denies Joint pain, stiffness or swelling Skin: Denies  skin rash, easy bruising or bleeding or hives Endoc:  Denies polyuria, polydipsia , polyphagia or weight change Psych:   Denies depression, insomnia or hallucinations  Other:  All other systems negative    BP 122/60 (BP Location: Left Arm, Cuff Size:  Normal)   Pulse (!) 105   Ht 5\' 1"  (1.549 m)   Wt 98 lb (44.5 kg)   SpO2 95%   BMI 18.52 kg/m    Physical  Examination:   GENERAL:NAD,  +weakness +fatigue HEAD: Normocephalic, atraumatic.  EYES: Pupils equal, round, reactive to light. Extraocular muscles intact. No scleral icterus.  MOUTH: Moist mucosal membrane. Dentition intact. No abscess noted.  EAR, NOSE, THROAT: Clear without exudates. No external lesions.  NECK: Supple. No thyromegaly. No nodules. No JVD.  PULMONARY: CTA B/L no wheezing, rhonchi, crackles CARDIOVASCULAR: S1 and S2. Regular rate and rhythm. No murmurs, rubs, or gallops. No edema. Pedal pulses 2+ bilaterally.  GASTROINTESTINAL: Soft, nontender, nondistended. No masses. Positive bowel sounds. No hepatosplenomegaly.  MUSCULOSKELETAL: No swelling, clubbing, or edema. Range of motion full in all extremities.  NEUROLOGIC: Cranial nerves II through XII are intact. No gross focal neurological deficits. Sensation intact. Reflexes intact.  SKIN: No ulceration, lesions, rashes, or cyanosis. Skin warm and dry. Turgor intact.  PSYCHIATRIC: Mood, affect within normal limits. The patient is awake, alert and oriented x 3. Insight, judgment intact.  ALL OTHER ROS ARE NEGATIVE        ASSESSMENT / PLAN:  63 year old pleasant white female Gold stage B with severe COPD previous history of lung cancer squamous cell carcinoma status post resection with chemoradiation with progressive worsening of CT scans with right lower lobe cavitary lesion with air-fluid level with multifocal opacifications bilaterally associated with groundglass opacifications with signs and symptoms of COPD exacerbation with intermittent wheezing productive cough and some shortness of breath associated with fevers and chills and night sweats   At this time I do believe she still has ongoing infection with multifocal pneumonia She has completed antibiotics and prednisone therapy for COPD exacerbation  At this time I still recommend undergoing bronchoscopy for lavage just to rule out fungal and atypical  infections  Patient has agreed to undergo bronchoscopy   The Risks and Benefits of the Bronchoscopy procedure with Bronchoscopy were explained to patient  I have discussed the risk for Acute Bleeding, increased chance of Infection, increased chance of Respiratory Failure and Cardiac Arrest, increased chance of pneumothorax and collapsed lung, as well as increased Stroke and Death.  I have also explained to avoid all types of NSAIDs 1 week prior to procedure date  to decrease chance of bleeding, and also to avoid food and drinks the midnight prior to procedure.  The procedure consists of a video camera with a light source to be placed and inserted  into the lungs to  look for abnormal tissue and to obtain tissue samples by using needle and biopsy tools.  The patient  understand the risks and benefits and have agreed to proceed with procedure.    #1 abnormal CT chest consistent with lung abscess and multifocal pneumonia Obtain repeat CT chest now   #2 plan for bronchoscopy Risks and benefits explained to patient  #3 COPD exacerbation We will not prescribe prednisone or antibiotics at this time Will obtain lavages and sampling from bronchoscopy   #4 productive cough Continue flutter valve 10-15 times per day use it with nebulizers every 4 hours Robitussin with codeine as needed    Patient  satisfied with Plan of action and management. All questions answered follow up in 3 months    Mabry Santarelli Patricia Pesa, M.D.  [facility]  Medical Director [place] Director [facility] Cardio-Pulmonary Department

## 2018-10-08 NOTE — Patient Instructions (Signed)
Your procedure is scheduled on:10/10/18 Report to Day Surgery. [facility]OND FLOOR To find out your arrival time please call [phone] between 1PM - 3PM on 10/09/18.  Remember: Instructions that are not followed completely may result in serious medical risk,  up to and including death, or upon the discretion of your surgeon and anesthesiologist your  surgery may need to be rescheduled.     _X__ 1. Do not eat food after midnight the night before your procedure.                 No gum chewing or hard candies. You may drink clear liquids up to 2 hours                 before you are scheduled to arrive for your surgery- DO not drink clear                 liquids within 2 hours of the start of your surgery.                 Clear Liquids include:  water, apple juice without pulp, clear carbohydrate                 drink such as Clearfast of Gatorade, Black Coffee or Tea (Do not add                 anything to coffee or tea).  __X__2.  On the morning of surgery brush your teeth with toothpaste and water, you                may rinse your mouth with mouthwash if you wish.  Do not swallow any toothpaste of mouthwash.     _X__ 3.  No Alcohol for 24 hours before or after surgery.   _X__ 4.  Do Not Smoke or use e-cigarettes For 24 Hours Prior to Your Surgery.                 Do not use any chewable tobacco products for at least 6 hours prior to                 surgery.  ____  5.  Bring all medications with you on the day of surgery if instructed.   ____  6.  Notify your doctor if there is any change in your medical condition      (cold, fever, infections).     Do not wear jewelry, make-up, hairpins, clips or nail polish. Do not wear lotions, powders, or perfumes. You may wear deodorant. Do not shave 48 hours prior to surgery. Men may shave face and neck. Do not bring valuables to the hospital.    [facility] is not responsible for any belongings or  valuables.  Contacts, dentures or bridgework may not be worn into surgery. Leave your suitcase in the car. After surgery it may be brought to your room. For patients admitted to the hospital, discharge time is determined by your treatment team.   Patients discharged the day of surgery will not be allowed to drive home.    _X___ Take these medicines the morning of surgery with A SIP OF WATER:    1.VENLAFAXINE  2.FLUDROCORTISONE   3.   4.  5.  6.  ____ Fleet Enema (as directed)   ____ Use CHG Soap as directed  ____ Use inhalers on the day of surgery  ____ Stop metformin 2 days prior to surgery  ____ Take 1/2 of usual insulin dose the night before surgery. No insulin the morning          of surgery.   _X___ Stop Coumadin/Plavix/aspirin on  NONE UNTIL AFTER SURGERY  X____ Stop Anti-inflammatories on  NONE UNTIL AFTER SURGERY   ____ Stop supplements until after surgery.    ____ Bring C-Pap to the hospital.

## 2018-10-08 NOTE — Patient Instructions (Signed)
Plan for CT chest  Plan for Bronchoscopy  Continue inhalers  Continue flutter valve 10-15 times per day  Cough syrup as needed

## 2018-10-08 NOTE — Progress Notes (Signed)
Name: Claudia Chavez MRN: [id number] DOB: 1956-01-20     CONSULTATION DATE: .1.17.19  REFERRING MD :  Genevive Bi   STUDIES:  CT chest 1.7.19 I have Independently reviewed images of  CT chest   on 10/08/2018 Interpretation:Right upper lobe nodule is grossly stable in size 05/25/2017 but appears more solid in appearance than on 05/25/2017 and is new from 08/07/2016. Findings are worrisome for bronchogenic carcinoma.   CT chest 09/06/18 I have Independently reviewed images of   Interpretation: Right upper lobe thick-walled cavitary process measures approximately 4.2 x 2.9 cm. There is also a right apical cavitary nodule on image number 27 measuring 10.5 mm. Left Lung GGO multifocal  Large thick-walled cavitary lesion in the right lower lobe with an air-fluid level measures 7.2 x 5.7 cm.    SYNOPSIS 63 y.o. female.  Approximately 3 years ago she underwent a right lower lobectomy after induction chemotherapy.  Postoperatively she was treated with radiation therapy and chemotherapy for presumed stage IIIa SQ cell carcinoma of the lung.  RT lung upper lobe nodule was found on surveillance CT s/p ENB 1.24.19 NON-diagnostic PFT/s c/w severe COPD Fev1 55%    Chief complaint -follow-up abnormal CT chest  HISTORY OF PRESENT ILLNESS:   Follow up for abnormal CT chest Productive cough last week yellow sputum +fevers +chills last week Very weak, fatigued  intermittent wheezing persistent cough that's really bad   Completed prednisone and ABX for COPD exacerbation and multifocal pneumonia  Reviewed CT chest-progressive RT lower lobe cavity Air fluid level-lung abscess/necrotizing pneumonia more likely  She will need Repeat CT chest  She had REFUSED Bronchoscopy prior to Christmas But now she is ready  She is non-toxic appearing No acute distress  Stopped smoking because of terrible cough   She has good appetite  no significant weight loss    PAST MEDICAL HISTORY :   has a past medical history of Cancer of lower lobe of right lung ([facility]) (05/07/2015), COPD (chronic obstructive pulmonary disease) ([facility]), Non-small cell lung cancer ([place]), and Pneumonia (04/2015).  has a past surgical history that includes Abdominal hysterectomy; Portacath placement (Right, 05/10/2015); Elbow surgery (Right, 1995); Video assisted thoracoscopy (vats)/thorocotomy (Right, 08/18/2015); Electormagnetic navigation bronchoscopy (N/A, 10/25/2017); Esophagogastroduodenoscopy (N/A, 06/08/2018); and Colonoscopy with propofol (N/A, 06/09/2018). Prior to Admission medications   Medication Sig Start Date End Date Taking? Authorizing Provider  Fluticasone-Salmeterol (ADVAIR DISKUS) 250-50 MCG/DOSE AEPB INHALE 1 INHALATION INTO THE LUNGS EVERY 12 (TWELVE) HOURS. 09/05/17  Yes [provider]   Allergies  Allergen Reactions  . Keppra [Levetiracetam] Other (See Comments)    Nausea and dizzy  . Lyrica [Pregabalin] Other (See Comments)    Made patient feel very dizzy and not feel good.      Review of Systems:  Gen:  + fever, +sweats, +chills -weigh loss  HEENT: Denies blurred vision, double vision, ear pain, eye pain, hearing loss, nose bleeds, sore throat Cardiac:  No dizziness, chest pain or heaviness, chest tightness,edema, No JVD Resp:   No cough, +sputum production, +shortness of breath,+wheezing, -hemoptysis,  Gi: Denies swallowing difficulty, stomach pain, nausea or vomiting, diarrhea, constipation, bowel incontinence Gu:  Denies bladder incontinence, burning urine Ext:   Denies Joint pain, stiffness or swelling Skin: Denies  skin rash, easy bruising or bleeding or hives Endoc:  Denies polyuria, polydipsia , polyphagia or weight change Psych:   Denies depression, insomnia or hallucinations  Other:  All other systems negative    BP 122/60 (BP Location: Left Arm, Cuff Size:  Normal)   Pulse (!) 105   Ht 5\' 1"  (1.549 m)   Wt 98 lb (44.5 kg)   SpO2 95%   BMI 18.52 kg/m    Physical  Examination:   GENERAL:NAD,  +weakness +fatigue HEAD: Normocephalic, atraumatic.  EYES: Pupils equal, round, reactive to light. Extraocular muscles intact. No scleral icterus.  MOUTH: Moist mucosal membrane. Dentition intact. No abscess noted.  EAR, NOSE, THROAT: Clear without exudates. No external lesions.  NECK: Supple. No thyromegaly. No nodules. No JVD.  PULMONARY: CTA B/L no wheezing, rhonchi, crackles CARDIOVASCULAR: S1 and S2. Regular rate and rhythm. No murmurs, rubs, or gallops. No edema. Pedal pulses 2+ bilaterally.  GASTROINTESTINAL: Soft, nontender, nondistended. No masses. Positive bowel sounds. No hepatosplenomegaly.  MUSCULOSKELETAL: No swelling, clubbing, or edema. Range of motion full in all extremities.  NEUROLOGIC: Cranial nerves II through XII are intact. No gross focal neurological deficits. Sensation intact. Reflexes intact.  SKIN: No ulceration, lesions, rashes, or cyanosis. Skin warm and dry. Turgor intact.  PSYCHIATRIC: Mood, affect within normal limits. The patient is awake, alert and oriented x 3. Insight, judgment intact.  ALL OTHER ROS ARE NEGATIVE        ASSESSMENT / PLAN:  63 year old pleasant white female Gold stage B with severe COPD previous history of lung cancer squamous cell carcinoma status post resection with chemoradiation with progressive worsening of CT scans with right lower lobe cavitary lesion with air-fluid level with multifocal opacifications bilaterally associated with groundglass opacifications with signs and symptoms of COPD exacerbation with intermittent wheezing productive cough and some shortness of breath associated with fevers and chills and night sweats   At this time I do believe she still has ongoing infection with multifocal pneumonia She has completed antibiotics and prednisone therapy for COPD exacerbation  At this time I still recommend undergoing bronchoscopy for lavage just to rule out fungal and atypical  infections  Patient has agreed to undergo bronchoscopy   The Risks and Benefits of the Bronchoscopy procedure with Bronchoscopy were explained to patient  I have discussed the risk for Acute Bleeding, increased chance of Infection, increased chance of Respiratory Failure and Cardiac Arrest, increased chance of pneumothorax and collapsed lung, as well as increased Stroke and Death.  I have also explained to avoid all types of NSAIDs 1 week prior to procedure date  to decrease chance of bleeding, and also to avoid food and drinks the midnight prior to procedure.  The procedure consists of a video camera with a light source to be placed and inserted  into the lungs to  look for abnormal tissue and to obtain tissue samples by using needle and biopsy tools.  The patient  understand the risks and benefits and have agreed to proceed with procedure.    #1 abnormal CT chest consistent with lung abscess and multifocal pneumonia Obtain repeat CT chest now   #2 plan for bronchoscopy Risks and benefits explained to patient  #3 COPD exacerbation We will not prescribe prednisone or antibiotics at this time Will obtain lavages and sampling from bronchoscopy   #4 productive cough Continue flutter valve 10-15 times per day use it with nebulizers every 4 hours Robitussin with codeine as needed    Patient  satisfied with Plan of action and management. All questions answered follow up in 3 months    Candelario Steppe Patricia Pesa, M.D.  [facility]  Medical Director [place] Director [facility] Cardio-Pulmonary Department

## 2018-10-08 NOTE — Telephone Encounter (Signed)
Checked on HTA PA List. Procedure code [phone] does not require PA. Claudia Chavez

## 2018-10-08 NOTE — Telephone Encounter (Signed)
Procedure: regular bronch with general anesthesia  Date: 10/10/18 @ 10:30 Dr. Mortimer Fries DX: pneumonia, history of lung cancer CPT: [phone]

## 2018-10-09 ENCOUNTER — Ambulatory Visit
Admission: RE | Admit: 2018-10-09 | Discharge: 2018-10-09 | Disposition: A | Discharge status: Home / Self Care | Payer: PPO | Source: Ambulatory Visit | Attending: Internal Medicine | Admitting: Internal Medicine

## 2018-10-09 DIAGNOSIS — J181 Lobar pneumonia, unspecified organism: Secondary | ICD-10-CM | POA: Diagnosis not present

## 2018-10-09 DIAGNOSIS — J189 Pneumonia, unspecified organism: Secondary | ICD-10-CM | POA: Diagnosis not present

## 2018-10-10 ENCOUNTER — Other Ambulatory Visit: Payer: Self-pay | Admitting: Internal Medicine

## 2018-10-10 ENCOUNTER — Ambulatory Visit: Payer: PPO | Admitting: Registered Nurse

## 2018-10-10 ENCOUNTER — Encounter
Admission: RE | Disposition: A | Discharge status: Home / Self Care | Payer: Self-pay | Source: Home / Self Care | Attending: Internal Medicine

## 2018-10-10 ENCOUNTER — Other Ambulatory Visit: Payer: Self-pay

## 2018-10-10 ENCOUNTER — Ambulatory Visit
Admission: RE | Admit: 2018-10-10 | Discharge: 2018-10-10 | Disposition: A | Discharge status: Home / Self Care | Payer: PPO | Attending: Internal Medicine | Admitting: Internal Medicine

## 2018-10-10 ENCOUNTER — Ambulatory Visit: Payer: PPO

## 2018-10-10 DIAGNOSIS — R918 Other nonspecific abnormal finding of lung field: Secondary | ICD-10-CM

## 2018-10-10 DIAGNOSIS — Z8673 Personal history of transient ischemic attack (TIA), and cerebral infarction without residual deficits: Secondary | ICD-10-CM | POA: Diagnosis not present

## 2018-10-10 DIAGNOSIS — J700 Acute pulmonary manifestations due to radiation: Secondary | ICD-10-CM | POA: Insufficient documentation

## 2018-10-10 DIAGNOSIS — J440 Chronic obstructive pulmonary disease with acute lower respiratory infection: Secondary | ICD-10-CM | POA: Insufficient documentation

## 2018-10-10 DIAGNOSIS — Z87891 Personal history of nicotine dependence: Secondary | ICD-10-CM | POA: Insufficient documentation

## 2018-10-10 DIAGNOSIS — D649 Anemia, unspecified: Secondary | ICD-10-CM | POA: Insufficient documentation

## 2018-10-10 DIAGNOSIS — Z7951 Long term (current) use of inhaled steroids: Secondary | ICD-10-CM | POA: Insufficient documentation

## 2018-10-10 DIAGNOSIS — Z85118 Personal history of other malignant neoplasm of bronchus and lung: Secondary | ICD-10-CM | POA: Diagnosis not present

## 2018-10-10 DIAGNOSIS — Z90710 Acquired absence of both cervix and uterus: Secondary | ICD-10-CM | POA: Diagnosis not present

## 2018-10-10 DIAGNOSIS — C3411 Malignant neoplasm of upper lobe, right bronchus or lung: Secondary | ICD-10-CM | POA: Diagnosis not present

## 2018-10-10 DIAGNOSIS — Z9221 Personal history of antineoplastic chemotherapy: Secondary | ICD-10-CM | POA: Insufficient documentation

## 2018-10-10 DIAGNOSIS — Z902 Acquired absence of lung [part of]: Secondary | ICD-10-CM | POA: Diagnosis not present

## 2018-10-10 DIAGNOSIS — F329 Major depressive disorder, single episode, unspecified: Secondary | ICD-10-CM | POA: Insufficient documentation

## 2018-10-10 DIAGNOSIS — Z888 Allergy status to other drugs, medicaments and biological substances status: Secondary | ICD-10-CM | POA: Diagnosis not present

## 2018-10-10 DIAGNOSIS — J42 Unspecified chronic bronchitis: Secondary | ICD-10-CM | POA: Diagnosis not present

## 2018-10-10 DIAGNOSIS — J189 Pneumonia, unspecified organism: Secondary | ICD-10-CM | POA: Diagnosis not present

## 2018-10-10 HISTORY — DX: Dyspnea, unspecified: R06.00

## 2018-10-10 HISTORY — PX: FLEXIBLE BRONCHOSCOPY: SHX5094

## 2018-10-10 SURGERY — BRONCHOSCOPY, FLEXIBLE
Anesthesia: General | Laterality: Right

## 2018-10-10 MED ORDER — SUCCINYLCHOLINE CHLORIDE 20 MG/ML IJ SOLN
INTRAMUSCULAR | Status: AC
  Filled 2018-10-10: qty 1

## 2018-10-10 MED ORDER — GLYCOPYRROLATE 0.2 MG/ML IJ SOLN
INTRAMUSCULAR | Status: DC | PRN
  Administered 2018-10-10: 0.1 mg via INTRAVENOUS

## 2018-10-10 MED ORDER — LIDOCAINE HCL (PF) 2 % IJ SOLN
INTRAMUSCULAR | Status: AC
  Filled 2018-10-10: qty 10

## 2018-10-10 MED ORDER — PROPOFOL 10 MG/ML IV BOLUS
INTRAVENOUS | Status: AC
  Filled 2018-10-10: qty 20

## 2018-10-10 MED ORDER — LIDOCAINE HCL URETHRAL/MUCOSAL 2 % EX GEL
CUTANEOUS | Status: AC
  Filled 2018-10-10: qty 5

## 2018-10-10 MED ORDER — ONDANSETRON HCL 4 MG/2ML IJ SOLN
INTRAMUSCULAR | Status: DC | PRN
  Administered 2018-10-10: 4 mg via INTRAVENOUS

## 2018-10-10 MED ORDER — FENTANYL CITRATE (PF) 100 MCG/2ML IJ SOLN
INTRAMUSCULAR | Status: AC
  Filled 2018-10-10: qty 2

## 2018-10-10 MED ORDER — LIDOCAINE HCL (CARDIAC) PF 100 MG/5ML IV SOSY
PREFILLED_SYRINGE | INTRAVENOUS | Status: DC | PRN
  Administered 2018-10-10: 60 mg via INTRAVENOUS

## 2018-10-10 MED ORDER — DEXAMETHASONE SODIUM PHOSPHATE 10 MG/ML IJ SOLN
INTRAMUSCULAR | Status: AC
  Filled 2018-10-10: qty 1

## 2018-10-10 MED ORDER — FAMOTIDINE 20 MG PO TABS
ORAL_TABLET | ORAL | Status: AC
  Filled 2018-10-10: qty 1

## 2018-10-10 MED ORDER — PROPOFOL 10 MG/ML IV BOLUS
INTRAVENOUS | Status: DC | PRN
  Administered 2018-10-10: 90 mg via INTRAVENOUS

## 2018-10-10 MED ORDER — METHYLPREDNISOLONE SODIUM SUCC 125 MG IJ SOLR
INTRAMUSCULAR | Status: DC | PRN
  Administered 2018-10-10: 125 mg via INTRAVENOUS

## 2018-10-10 MED ORDER — DEXAMETHASONE SODIUM PHOSPHATE 10 MG/ML IJ SOLN
INTRAMUSCULAR | Status: DC | PRN
  Administered 2018-10-10: 10 mg via INTRAVENOUS

## 2018-10-10 MED ORDER — LACTATED RINGERS IV SOLN
INTRAVENOUS | Status: DC
  Administered 2018-10-10: 10:00:00 via INTRAVENOUS

## 2018-10-10 MED ORDER — FAMOTIDINE 20 MG PO TABS
20.0000 mg | ORAL_TABLET | Freq: Once | ORAL | Status: AC
  Administered 2018-10-10: 20 mg via ORAL

## 2018-10-10 MED ORDER — ROCURONIUM BROMIDE 100 MG/10ML IV SOLN
INTRAVENOUS | Status: DC | PRN
  Administered 2018-10-10: 30 mg via INTRAVENOUS

## 2018-10-10 MED ORDER — LIDOCAINE HCL (PF) 1 % IJ SOLN
INTRAMUSCULAR | Status: AC
  Filled 2018-10-10: qty 5

## 2018-10-10 MED ORDER — MIDAZOLAM HCL 2 MG/2ML IJ SOLN
INTRAMUSCULAR | Status: AC
  Filled 2018-10-10: qty 2

## 2018-10-10 MED ORDER — ONDANSETRON HCL 4 MG/2ML IJ SOLN
INTRAMUSCULAR | Status: AC
  Filled 2018-10-10: qty 2

## 2018-10-10 MED ORDER — SUGAMMADEX SODIUM 200 MG/2ML IV SOLN
INTRAVENOUS | Status: DC | PRN
  Administered 2018-10-10: 180 mg via INTRAVENOUS

## 2018-10-10 MED ORDER — GLYCOPYRROLATE 0.2 MG/ML IJ SOLN
INTRAMUSCULAR | Status: AC
  Filled 2018-10-10: qty 1

## 2018-10-10 MED ORDER — ROCURONIUM BROMIDE 50 MG/5ML IV SOLN
INTRAVENOUS | Status: AC
  Filled 2018-10-10: qty 1

## 2018-10-10 NOTE — Interval H&P Note (Signed)
History and Physical Interval Note:  10/10/2018 10:30 AM  Claudia Chavez  has presented today for surgery, with the diagnosis of PNEUMONIA, HISTORY OF LUNG CANCER  The various methods of treatment have been discussed with the patient and family. After consideration of risks, benefits and other options for treatment, the patient has consented to  Procedure(s): FLEXIBLE BRONCHOSCOPY (Right) as a surgical intervention .  The patient's history has been reviewed, patient examined, no change in status, stable for surgery.  I have reviewed the patient's chart and labs.  Questions were answered to the patient's satisfaction.     The Risks and Benefits of the Bronchoscopy procedure with BRONCHOSCOPY were explained to patient/family.  I have discussed the risk for Acute Bleeding, increased chance of Infection, increased chance of Respiratory Failure and Cardiac Arrest, increased chance of pneumothorax and collapsed lung, as well as increased Stroke and Death.  I have also explained to avoid all types of NSAIDs 1 week prior to procedure date  to decrease chance of bleeding, and also to avoid food and drinks the midnight prior to procedure.  The procedure consists of a video camera with a light source to be placed and inserted  into the lungs to  look for abnormal tissue and to obtain tissue samples by using needle and biopsy tools.  The patient/family understand the risks and benefits and have agreed to proceed with procedure.   CT CHEST REVIEWED WITH PATIENT PATIENT WILL NEED TO R/O TB   [name]

## 2018-10-10 NOTE — Anesthesia Post-op Follow-up Note (Signed)
Anesthesia QCDR form completed.        

## 2018-10-10 NOTE — Transfer of Care (Signed)
Immediate Anesthesia Transfer of Care Note  Patient: Claudia Chavez  Procedure(s) Performed: FLEXIBLE BRONCHOSCOPY (Right )  Patient Location: Negative Pressure Recovery Room   Anesthesia Type:General  Level of Consciousness: awake, alert , oriented and patient cooperative  Airway & Oxygen Therapy: Patient Spontanous Breathing  Post-op 2:  Report given to RN and Post -op Vital signs reviewed and stable  Post vital signs: Reviewed and stable  Last Vitals:  Vitals Value Taken Time  BP 137/75 10/10/2018 11:36 AM  Temp 36.4 C 10/10/2018 11:36 AM  Pulse 106 10/10/2018 11:36 AM  Resp 18 10/10/2018 11:36 AM  SpO2 93 % 10/10/2018 11:36 AM    Last Pain:  Vitals:   10/10/18 1136  TempSrc:   PainSc: 0-No pain         Complications: No apparent anesthesia complications

## 2018-10-10 NOTE — Op Note (Signed)
  PROCEDURE: BRONCHOSCOPY Therapeutic Aspiration of Tracheobronchial Tree  PROCEDURE DATE: 10/10/2018  TIME:  NAME:  Claudia Chavez  DOB:05/18/1956  MRN: [id number]  Code Status History    Date Active Date Inactive Code Status Order ID Comments User Context   06/07/2018 1728 06/09/2018 1745 Full Code [id number]  Fritzi Mandes, MD Inpatient   05/24/2018 1141 05/25/2018 1651 Full Code [id number]  Gorden Harms, MD Inpatient   08/18/2015 2014 08/23/2015 2219 Full Code [id number]  Nestor Lewandowsky, MD Inpatient          Indications/Preliminary Diagnosis:   Consent: (Place X beside choice/s below)  The benefits, risks and possible complications of the procedure were        explained to:  _x__ patient  _x__ patient's family  ___ other:___________  who verbalized understanding and gave:  ___ verbal  ___ written  _x__ verbal and written  ___ telephone  ___ other:________ consent.      Unable to obtain consent; procedure performed on emergent basis.     Other:       PRESEDATION ASSESSMENT: History and Physical has been performed. Patient meds and allergies have been reviewed. Presedation airway examination has been performed and documented. Baseline vital signs, sedation score, oxygenation status, and cardiac rhythm were reviewed. Patient was deemed to be in satisfactory condition to undergo the procedure.        PROCEDURE DETAILS: Timeout performed and correct patient, name, & ID confirmed. Following prep per Pulmonary policy, appropriate sedation was administered. The Bronchoscope was inserted in to oral cavity with bite block in place. Therapeutic aspiration of Tracheobronchial tree was performed.  Airway exam proceeded with findings, technical procedures, and specimen collection as noted below. At the end of exam the scope was withdrawn without incident. Impression and Plan as noted below.           Airway Prep (Place X beside choice below)   1% Transtracheal Lidocaine  Anesthetization 7 cc   Patient prepped per Bronchoscopy Lab Policy       Insertion Route (Place X beside choice below)   Nasal   Oral  x Endotracheal Tube   Tracheostomy  TECHNICAL PROCEDURES: (Place X beside choice below)   Procedures  Description    None     Electrocautery     Cryotherapy     Balloon Dilatation     Bronchography     Stent Placement   x  Therapeutic Aspiration     Laser/Argon Plasma    Brachytherapy Catheter Placement    Foreign Body Removal         SPECIMENS (Sites): (Place X beside choice below)  Specimens Description   No Specimens Obtained     [place]ash[place]   x Lavage 40 cc's NS instilled and return for all cultures and cytology, (10 cc's sent for Cytology)  x Biopsies Endobronchial x4 RUL   Fine Needle Aspirates   x Brushings 1 brush RUL, 1 brush RLL   Sputum    FINDINGS: extensive amounts of purulant secretions inflammed airways RLL sutures intact ESTIMATED BLOOD LOSS: none COMPLICATIONS/RESOLUTION: none      IMPRESSION:POST-PROCEDURE DX: pneumonia with radiation pneumonitis r/o TB,fungal infection Follow up AFB stains and culture, bacterial stains and cultures, fungal stains and cultures Follow up pathology reports brush and biopsy      Pinkie Manger Patricia Pesa, M.D.  [facility]  Medical Director [place] Director [place] Department

## 2018-10-10 NOTE — Anesthesia Postprocedure Evaluation (Signed)
Anesthesia Post Note  Patient: LARCENIA HOLADAY  Procedure(s) Performed: FLEXIBLE BRONCHOSCOPY (Right )  Patient location during evaluation: PACU Anesthesia Type: General Level of consciousness: awake and alert and oriented Pain management: pain level controlled Vital Signs Assessment: post-procedure vital signs reviewed and stable Respiratory status: spontaneous breathing, nonlabored ventilation and respiratory function stable Cardiovascular status: blood pressure returned to baseline and stable Postop Assessment: no signs of nausea or vomiting Anesthetic complications: no     Last Vitals:  Vitals:   10/10/18 1213 10/10/18 1229  BP: (!) 93/48 (!) 112/58  Pulse: 90 89  Resp: 20 20  Temp: 37 C   SpO2: 93% 93%    Last Pain:  Vitals:   10/10/18 1213  TempSrc: Tympanic  PainSc: 0-No pain                 Ahsan Esterline

## 2018-10-10 NOTE — Discharge Instructions (Addendum)
AMBULATORY SURGERY  DISCHARGE INSTRUCTIONS   1) The drugs that you were given will stay in your system until tomorrow so for the next 24 hours you should not:  A) Drive an automobile B) Make any legal decisions C) Drink any alcoholic beverage   2) You may resume regular meals tomorrow.  Today it is better to start with liquids and gradually work up to solid foods.  Dr Mortimer Fries will have his office call you for follow up appt and results from todays test.  You may eat anything you prefer, but it is better to start with liquids, then soup and crackers, and gradually work up to solid foods.   3) Please notify your doctor immediately if you have any unusual bleeding, trouble breathing, redness and pain at the surgery site, drainage, fever, or pain not relieved by medication.    4) Additional Instructions:        Please contact your physician with any problems or Same Day Surgery at [phone], Monday through Friday 6 am to 4 pm, or [place] at [facility] number at [phone]. Flexible Bronchoscopy, Care After This sheet gives you information about how to care for yourself after your test. Your doctor may also give you more specific instructions. If you have problems or questions, contact your doctor. Follow these instructions at home: Eating and drinking  Do not eat or drink anything (not even water) for 2 hours after your test, or until your numbing medicine (local anesthetic) wears off.  When your numbness is gone and your cough and gag reflexes have come back, you may: ? Eat only soft foods. ? Slowly drink liquids.  The day after the test, go back to your normal diet. Driving  Do not drive for 24 hours if you were given a medicine to help you relax (sedative).  Do not drive or use heavy machinery while taking prescription pain medicine. General instructions   Take over-the-counter and prescription medicines only as told by your doctor.  Return to your normal  activities as told. Ask what activities are safe for you.  Do not use any products that have nicotine or tobacco in them. This includes cigarettes and e-cigarettes. If you need help quitting, ask your doctor.  Keep all follow-up visits as told by your doctor. This is important. It is very important if you had a tissue sample (biopsy) taken. Get help right away if:  You have shortness of breath that gets worse.  You get light-headed.  You feel like you are going to pass out (faint).  You have chest pain.  You cough up: ? More than a little blood. ? More blood than before. Summary  Do not eat or drink anything (not even water) for 2 hours after your test, or until your numbing medicine wears off.  Do not use cigarettes. Do not use e-cigarettes.  Get help right away if you have chest pain. This information is not intended to replace advice given to you by your health care provider. Make sure you discuss any questions you have with your health care provider. Document Released: 07/16/2009 Document Revised: 10/06/2016 Document Reviewed: 10/06/2016 Elsevier Interactive Patient Education  2019 [employer].

## 2018-10-10 NOTE — Anesthesia Preprocedure Evaluation (Signed)
Anesthesia Evaluation  Patient identified by MRN, date of birth, ID band Patient awake    Reviewed: Allergy & Precautions, NPO status , Patient's Chart, lab work & pertinent test results  History of Anesthesia Complications Negative for: history of anesthetic complications  Airway Mallampati: II  TM Distance: >3 FB Neck ROM: Full    Dental  (+) Upper Dentures, Partial Lower   Pulmonary neg sleep apnea, COPD,  COPD inhaler, former smoker,    breath sounds clear to auscultation- rhonchi (-) wheezing      Cardiovascular (-) hypertension(-) CAD, (-) Past MI, (-) Cardiac Stents and (-) CABG  Rhythm:Regular Rate:Normal - Systolic murmurs and - Diastolic murmurs    Neuro/Psych PSYCHIATRIC DISORDERS Depression CVA, No Residual Symptoms    GI/Hepatic negative GI ROS, Neg liver ROS,   Endo/Other  negative endocrine ROSneg diabetes  Renal/GU negative Renal ROS     Musculoskeletal negative musculoskeletal ROS (+)   Abdominal (+) - obese,   Peds  Hematology  (+) anemia ,   Anesthesia Other Findings Past Medical History: 05/07/2015: Cancer of lower lobe of right lung ([facility]) No date: COPD (chronic obstructive pulmonary disease) ([facility]) No date: Dyspnea No date: Non-small cell lung cancer ([facility]) 04/2015: Pneumonia   Reproductive/Obstetrics                             Anesthesia Physical Anesthesia Plan  ASA: III  Anesthesia Plan: General   Post-op Pain Management:    Induction: Intravenous  PONV Risk Score and Plan: 2 and Ondansetron and Midazolam  Airway Management Planned: Oral ETT  Additional Equipment:   Intra-op Plan:   Post-operative Plan: Extubation in OR  Informed Consent: I have reviewed the patients History and Physical, chart, labs and discussed the procedure including the risks, benefits and alternatives for the proposed anesthesia with the patient or authorized representative who  has indicated his/her understanding and acceptance.   Dental advisory given  Plan Discussed with: CRNA and Anesthesiologist  Anesthesia Plan Comments:         Anesthesia Quick Evaluation

## 2018-10-11 ENCOUNTER — Encounter: Payer: Self-pay | Admitting: Internal Medicine

## 2018-10-14 LAB — ACID FAST SMEAR (AFB): ACID FAST SMEAR - AFSCU2: NEGATIVE

## 2018-10-14 LAB — CULTURE, BAL-QUANTITATIVE W GRAM STAIN: Culture: 5000 — AB

## 2018-10-14 LAB — ACID FAST SMEAR (AFB, MYCOBACTERIA)

## 2018-10-14 LAB — ASPERGILLUS ANTIGEN, BAL/SERUM: Aspergillus Ag, BAL/Serum: 0.08 Index (ref 0.00–0.49)

## 2018-10-14 LAB — SURGICAL PATHOLOGY

## 2018-10-15 LAB — CYTOLOGY - NON PAP

## 2018-10-18 ENCOUNTER — Telehealth: Payer: Self-pay | Admitting: Internal Medicine

## 2018-10-18 NOTE — Telephone Encounter (Signed)
Per Dr. Alva Garnet- this is not TB.   Routing to Dr. Mortimer Fries to f/u when in office as requested by Dr. Alva Garnet.

## 2018-10-18 NOTE — Telephone Encounter (Signed)
Claudia Chavez with cardio pulmonary called with call report for positive acid fast culture. Results are within epic.  Routing results to Dr. Mortimer Fries to confirm TB dx prior to calling pt and health department.

## 2018-10-18 NOTE — Telephone Encounter (Signed)
Is this confirmed TB or Acid Fast is Positive?

## 2018-10-21 DIAGNOSIS — D509 Iron deficiency anemia, unspecified: Secondary | ICD-10-CM | POA: Diagnosis not present

## 2018-10-21 DIAGNOSIS — R05 Cough: Secondary | ICD-10-CM | POA: Diagnosis not present

## 2018-10-21 DIAGNOSIS — R5383 Other fatigue: Secondary | ICD-10-CM | POA: Diagnosis not present

## 2018-10-21 DIAGNOSIS — R5381 Other malaise: Secondary | ICD-10-CM | POA: Diagnosis not present

## 2018-10-22 ENCOUNTER — Other Ambulatory Visit: Payer: Self-pay | Admitting: Internal Medicine

## 2018-10-22 DIAGNOSIS — A310 Pulmonary mycobacterial infection: Secondary | ICD-10-CM

## 2018-10-22 NOTE — Telephone Encounter (Signed)
I have called patient and updated her on her results  She does NOT have TB but she has MAC infection She will need ID referral placement  Also please prescribe Tesselon Perles 200 mg TID as needed

## 2018-10-22 NOTE — Telephone Encounter (Signed)
Pt aware appt scheduled with Dr. Delaine Lame 10/24/18 at 10 am.

## 2018-10-24 ENCOUNTER — Other Ambulatory Visit: Payer: Self-pay | Admitting: *Deleted

## 2018-10-24 ENCOUNTER — Ambulatory Visit: Payer: PPO | Attending: Infectious Diseases | Admitting: Infectious Diseases

## 2018-10-24 ENCOUNTER — Inpatient Hospital Stay: Payer: PPO | Attending: Internal Medicine

## 2018-10-24 ENCOUNTER — Telehealth: Payer: Self-pay | Admitting: *Deleted

## 2018-10-24 ENCOUNTER — Encounter: Payer: Self-pay | Admitting: Infectious Diseases

## 2018-10-24 VITALS — BP 103/57 | HR 107 | Temp 97.3°F | Ht 61.0 in | Wt 97.8 lb

## 2018-10-24 DIAGNOSIS — Z7951 Long term (current) use of inhaled steroids: Secondary | ICD-10-CM | POA: Diagnosis not present

## 2018-10-24 DIAGNOSIS — Z85118 Personal history of other malignant neoplasm of bronchus and lung: Secondary | ICD-10-CM

## 2018-10-24 DIAGNOSIS — Z888 Allergy status to other drugs, medicaments and biological substances status: Secondary | ICD-10-CM

## 2018-10-24 DIAGNOSIS — F329 Major depressive disorder, single episode, unspecified: Secondary | ICD-10-CM | POA: Diagnosis not present

## 2018-10-24 DIAGNOSIS — D649 Anemia, unspecified: Secondary | ICD-10-CM | POA: Diagnosis not present

## 2018-10-24 DIAGNOSIS — Z79899 Other long term (current) drug therapy: Secondary | ICD-10-CM | POA: Diagnosis not present

## 2018-10-24 DIAGNOSIS — F419 Anxiety disorder, unspecified: Secondary | ICD-10-CM | POA: Diagnosis not present

## 2018-10-24 DIAGNOSIS — A310 Pulmonary mycobacterial infection: Secondary | ICD-10-CM

## 2018-10-24 DIAGNOSIS — Z923 Personal history of irradiation: Secondary | ICD-10-CM | POA: Diagnosis not present

## 2018-10-24 DIAGNOSIS — C3411 Malignant neoplasm of upper lobe, right bronchus or lung: Secondary | ICD-10-CM | POA: Diagnosis not present

## 2018-10-24 DIAGNOSIS — C3492 Malignant neoplasm of unspecified part of left bronchus or lung: Secondary | ICD-10-CM | POA: Diagnosis not present

## 2018-10-24 DIAGNOSIS — J449 Chronic obstructive pulmonary disease, unspecified: Secondary | ICD-10-CM

## 2018-10-24 DIAGNOSIS — Z9221 Personal history of antineoplastic chemotherapy: Secondary | ICD-10-CM

## 2018-10-24 DIAGNOSIS — F17211 Nicotine dependence, cigarettes, in remission: Secondary | ICD-10-CM | POA: Diagnosis not present

## 2018-10-24 NOTE — Patient Instructions (Addendum)
You are here for an infection in your lung called MAC- I will review all your information with your doctors and if we agree to treat you will be on three different antibioitcs- azithromycin, ethambutol and rifabutin(or rifampin)  You need to get an eye examination Will check your liver You have been told by your PCP and oncology to go to ED today for blood transfusion as your Hb is 6.1 Will see you in 2-3 weeks to start treatment

## 2018-10-24 NOTE — Telephone Encounter (Signed)
Contacted patient per v/o Dr. Rogue Bussing. Arranged for 2 units of blood to be transfused tomorrow (8:30 am) for patient. Pt will come today in the clinic for type screen and 2 units of blood prepare.  apts provided for patient. She gave verbal understanding of the plan of care.

## 2018-10-24 NOTE — Progress Notes (Signed)
NAME: Claudia Chavez  DOB: 02-02-1956  MRN: [id number]  Date/Time: 10/24/2018 10:47 AM  Claudia Chavez Subjective:  REASON FOR CONSULT: Pulmonary MAC ? Claudia Chavez is a 63 y.o.female  with a history of Lung cancer , COPD Is presenting with cavitary lung lesion and positive MAC in Bronch lavage culture  Patient was diagnosed non-small cell right lung cancer in July 2016 and underwent 2 cycles of chemotherapy with Taxol and carboplatin  .     She developed  neuropathy.  And chemo was stopped.  She had right lower lobe resection  in November 2016 at [facility] by Dr. Faith Chavez.  This was followed by radiation therapy to the right lung in February 2017 for 30 days.  In May 2017 a repeat CAT scan as shown below was noted as interval right lower lobe resection without evidence of local recurrence or definite metastatic disease. I She was followed by oncology with serial CAT scans and PET scans. PET scan Feb 9th 2018- no evidence of recurrence; postoperative changes/stable right hilar lymph node without any activity noted.  On 05/25/17 CT showed partial cavitary right upper lobe pulmonary nodule, most consistent with metastatic disease versus metachronous primary. 3. Slight enlargement of a borderline pathologically sized right hilar lymph node. Indeterminate. Cannot exclude nodal metastasis.   CT of the chest repeated on October 25, 2017   Reported as similar size of the right upper lobe pulmonary nodule when comparing to the previous diagnostic CT chest of 05/25/2017, but in the interval, the lesion has become confluent in soft tissue attenuation suggesting interval progression.  She was seen pulmonologist Claudia Chavez and underwent bronch and biopsy on 10/25/17.  Pathology report was VERY SCANT LUNG TISSUE WITH PIGMENTED MACROPHAGES. - NO NEOPLASM IS IDENTIFIED IN THIS SPECIMEN She received  SBRT  to the right lung X 5 doses- in Feb 2019  CT of the chest from March 15 2018-showed interval  development of a new 6 x 4 mm left upper lobe pulmonary nodule. Metastatic disease a concern. Metachronous primary lesion not excluded. 2. Right upper lobe pulmonary nodule seen on prior studies has become incorporated into an area of ill-defined interstitial and airspace disease in the right upper lung compatible with radiation treatment.    She was complaining of cough and productive sputum and she was treated with multiple courses of antibiotics including Levaquin.  A repeat CT scan was done in  Jan 2020 and it showed Thick-walled, partially cavitary process in the right lung apex measuring 5.4 x 7.4 cm (series 3/image 34), progressive, previously 4.2 x 2.9 cm.  Additional thick-walled cavitary process posteriorly in the right upper lobe measuring 8.1 x 5.5 cm (series 3/image 61), previously 7.7 x 5.1 cm when measured in a similar fashion.      She underwent bronchoscopy and bronchoalveolar lavage was sent for AFB and fungal and bacterial cultures.  The site allergy showed BENIGN ALVEOLATED LUNG PARENCHYMA AND BRONCHIAL WALL WITH FOCAL  ORGANIZING FIBRIN. - NO EVIDENCE OF TUMOR OR GRANULOMA.    AFB and GMS special stains were negative. The AFB nucleic acid amplification testing was done and that is positive for Mycobacterium avium intracellulare and she has been referred to me for treatment.   Patient has no fever or chills.  Her weight is stable she is petite and weighs around 90 pounds.  She has cough which is productive of purulent sputum.  There is no obvious hemoptysis.  She says the antibiotics have not really helped much.  Today  she was called by her primary care physician who said her hemoglobin was 6.1 and asked her to go to the ED for blood transfusion.  Patient has some dizziness if she abruptly goes from lying to standing. She has no nausea or vomiting No blurred vision She lives on her own.  She was a smoker until December 2019.  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 Husband died in 11-27-16 of lung cancer as well.  Past Medical History:  Diagnosis Date  . Cancer of lower lobe of right lung ([place]) 05/07/2015  . COPD (chronic obstructive pulmonary disease) (Claudia Chavez)   . Dyspnea   . Non-small cell lung cancer ([place])   . Pneumonia 04/2015    Past Surgical History:  Procedure Laterality Date  . ABDOMINAL HYSTERECTOMY    . COLONOSCOPY WITH PROPOFOL N/A 06/09/2018   Procedure: COLONOSCOPY WITH PROPOFOL;  Surgeon: Claudia Chavez, Claudia Pike, MD;  Location: [facility] ENDOSCOPY;  Service: Gastroenterology;  Laterality: N/A;  . ELBOW SURGERY Right 1995  . ELECTROMAGNETIC NAVIGATION BROCHOSCOPY N/A 10/25/2017   Procedure: ELECTROMAGNETIC NAVIGATION BRONCHOSCOPY;  Surgeon: Claudia Lipps, MD;  Location: [facility] ORS;  Service: Cardiopulmonary;  Laterality: N/A;  . ESOPHAGOGASTRODUODENOSCOPY N/A 06/08/2018   Procedure: ESOPHAGOGASTRODUODENOSCOPY (EGD);  Surgeon: Claudia Chavez, Claudia Pike, MD;  Location: [facility] ENDOSCOPY;  Service: Gastroenterology;  Laterality: N/A;  . FLEXIBLE BRONCHOSCOPY Right 10/10/2018   Procedure: FLEXIBLE BRONCHOSCOPY;  Surgeon: Claudia Lipps, MD;  Location: [facility] ORS;  Service: Cardiopulmonary;  Laterality: Right;  . PORTACATH PLACEMENT Right 05/10/2015   Procedure: INSERTION PORT-A-CATH;  Surgeon: Claudia Lewandowsky, MD;  Location: [facility] ORS;  Service: General;  Laterality: Right;  Marland Kitchen VIDEO ASSISTED THORACOSCOPY (VATS)/THOROCOTOMY Right 08/18/2015   Procedure: VIDEO ASSISTED THORACOSCOPY (VATS)/THOROCOTOMY;  Surgeon: Claudia Lewandowsky, MD;  Location: [facility] ORS;  Service: General;  Laterality: Right;    Social History   Socioeconomic History  . Marital status: Widowed    Spouse name: Not on file  . Number of children: Not on file  . Years of education: Not on file  . Highest education level: Not on file  Occupational History  . Not on file  Social Needs  . Financial resource strain: Not on file  . Food insecurity:    Worry: Not on file    Inability: Not on file  .  Transportation needs:    Medical: Not on file    Non-medical: Not on file  Tobacco Use  . Smoking status: Former Smoker    Packs/day: 0.50    Years: 40.00    Pack years: 20.00    Types: Cigarettes    Last attempt to quit: 09/12/2018    Years since quitting: 0.1  . Smokeless tobacco: Never Used  Substance and Sexual Activity  . Alcohol use: Not Currently    Alcohol/week: 2.0 - 4.0 standard drinks    Types: 2 - 4 Cans of beer per week    Comment: beer-occasionally  . Drug use: No  . Sexual activity: Never  Lifestyle  . Physical activity:    Days per week: Not on file    Minutes per session: Not on file  . Stress: Not on file  Relationships  . Social connections:    Talks on phone: Not on file    Gets together: Not on file    Attends religious service: Not on file    Active member of club or organization: Not on file    Attends meetings of clubs or organizations: Not on file  Relationship status: Not on file  . Intimate partner violence:    Fear of current or ex partner: Not on file    Emotionally abused: Not on file    Physically abused: Not on file    Forced sexual activity: Not on file  Other Topics Concern  . Not on file  Social History Narrative  . Not on file    Family History  Problem Relation Age of Onset  . Stroke Mother   . Lung cancer Father 21   Allergies  Allergen Reactions  . Keppra [Levetiracetam] Other (See Comments)    Nausea and dizzy  . Lyrica [Pregabalin] Other (See Comments)    Made patient feel very dizzy and not feel good.     ? Current Outpatient Medications  Medication Sig Dispense Refill  . albuterol (PROVENTIL HFA;VENTOLIN HFA) 108 (90 Base) MCG/ACT inhaler Inhale 2 puffs into the lungs every 6 (six) hours as needed for wheezing or shortness of breath.     Marland Kitchen aspirin EC 81 MG tablet Take 81 mg by mouth daily.    . Cholecalciferol (VITAMIN D3) 1000 units CAPS Take 2,000 Units by mouth daily.     . Fluticasone-Salmeterol (ADVAIR  DISKUS) 250-50 MCG/DOSE AEPB Inhale 1 puff into the lungs every 12 (twelve) hours.     Marland Kitchen guaiFENesin-codeine 100-10 MG/5ML syrup Take 5 mLs by mouth every 4 (four) hours as needed for cough. 120 mL 0  . ipratropium-albuterol (DUONEB) 0.5-2.5 (3) MG/3ML SOLN TAKE 3 MLS BY NEBULIZATION EVERY 4 (FOUR) HOURS AS NEEDED. (Patient taking differently: Take 3 mLs by nebulization 4 (four) times daily. ) 360 mL 3  . potassium chloride SA (K-DUR,KLOR-CON) 20 MEQ tablet Take 1.5 tablets (30 mEq total) by mouth daily. 40 tablet 3  . Respiratory Therapy Supplies (FLUTTER) DEVI 1 each by Does not apply route daily. 1 each 0  . venlafaxine XR (EFFEXOR-XR) 37.5 MG 24 hr capsule Take 37.5 mg by mouth daily with breakfast.     . vitamin B-12 (CYANOCOBALAMIN) 1000 MCG tablet Take 1,000 mcg by mouth daily.    . fludrocortisone (FLORINEF) 0.1 MG tablet Take 0.1 mg by mouth daily.     No current facility-administered medications for this visit.    Facility-Administered Medications Ordered in Other Visits  Medication Dose Route Frequency Provider Last Rate Last Dose  . sodium chloride flush (NS) 0.9 % injection 10 mL  10 mL Intravenous PRN Cammie Sickle, MD         Abtx:  Anti-infectives (From admission, onward)   None      REVIEW OF SYSTEMS:  Const: negative fever, negative chills, steady weight of 96 pounds Eyes: negative diplopia or visual changes, negative eye pain ENT: negative coryza, negative sore throat Resp: Positive cough, no hemoptysis, has dyspnea Cards: negative for chest pain, palpitations, lower extremity edema GU: negative for frequency, dysuria and hematuria GI: Negative for abdominal pain, diarrhea, bleeding, constipation Skin: negative for rash and pruritus Heme: negative for easy bruising and gum/nose bleeding MS: Generalized fatigue and weakness Neurolo:negative for headaches, has dizziness, vertigo, memory problems  Psych:  anxiety, depression  Endocrine: No polyuria or  polydipsia Allergy/Immunology-allergy to Keppra and Lyrica.  Has side effects like dizziness and nausea Objective:  VITALS:  BP (!) 103/57 (BP Location: Left Arm, Patient Position: Sitting, Cuff Size: Normal)   Pulse (!) 107   Temp (!) 97.3 F (36.3 C) (Oral)   Ht _0  (1.549 m)   Wt 97 lb 12.8 oz (44.4  kg)   SpO2 97%   BMI 18.48 kg/m  PHYSICAL EXAM:  General: Alert, cooperative, no distress, appears stated age.  Pale, Head: Normocephalic, without obvious abnormality, atraumatic. Eyes: Conjunctivae clear, anicteric sclerae. Pupils are equal ENT Nares normal. No drainage or sinus tenderness. Lips, mucosa, and tongue normal. No Thrush Neck: Supple, symmetrical, no adenopathy, thyroid: non tender no carotid bruit and no JVD. Back: No CVA tenderness. Lungs: Bilateral air entry crepitations present on the right side Heart: S1-S2 tachycardia Abdomen: Soft, non-tender,not distended. Bowel sounds normal. No masses Extremities: atraumatic, no cyanosis. No edema. No clubbing Skin: No rashes or lesions. Or bruising Lymph: Cervical, supraclavicular normal. Neurologic: Grossly non-focal Pertinent Labs Lab Results CBC    Component Value Date/Time   WBC 9.5 09/04/2018 1002   RBC 3.50 (L) 09/04/2018 1002   HGB 9.1 (L) 09/04/2018 1002   HCT 29.6 (L) 09/04/2018 1002   PLT 522 (H) 09/04/2018 1002   MCV 84.6 09/04/2018 1002   MCH 26.0 09/04/2018 1002   MCHC 30.7 09/04/2018 1002   RDW 15.2 09/04/2018 1002   LYMPHSABS 1.1 09/04/2018 1002   MONOABS 0.5 09/04/2018 1002   EOSABS 0.1 09/04/2018 1002   BASOSABS 0.1 09/04/2018 1002    CMP Latest Ref Rng & Units 09/04/2018 08/07/2018 07/30/2018  Glucose 70 - 99 mg/dL 93 100(H) 84  BUN 8 - 23 mg/dL _0 Creatinine 0.44 - 1.00 mg/dL 0.66 0.53 0.61  Sodium 135 - 145 mmol/L 133(L) 134(L) 137  Potassium 3.5 - 5.1 mmol/L 3.5 3.8 4.7  Chloride 98 - 111 mmol/L 97(L) 103 101  CO2 22 - 32 mmol/L _1 Calcium 8.9 - 10.3 mg/dL 8.9 9.4 9.1    Total Protein 6.5 - 8.1 g/dL - - -  Total Bilirubin 0.3 - 1.2 mg/dL - - -  Alkaline Phos 38 - 126 U/L - - -  AST 15 - 41 U/L - - -  ALT 0 - 44 U/L - - -      Microbiology: 10/10/2018 acid-fast organism ID by nucleic acid amplification test is M avium complex. IMAGING RESULTS: ?As above Impression/Recommendation 63 y.o.female  with a history of Lung cancer , COPD Is presenting with cavitary lung lesion and positive MAC in Bronch lavage culture  ? ? Cavitary lesions in the right lung.  This is a sequelae of radiation causing pneumonitis and now cavitation.  It is difficult to say whether MAC is a true pathogen or colonization.    because of her underlying lung pathology and progression, , COPD, thin build .we need to treat this as as a pathogen /pulmonary MAC infection.  She will require 2-3 drug combination.  The susceptibilities still pending.  But usually MAC is sensitive to macrolide like azithromycin or clarithromycin along with right albuterol and oral rifampin and ethambutol. Because of some drugs that she is taking there  is interaction with Advair rifampin .  Clarithromycin also has interactions. So would give her azithromycin 250 to 500 mg once a day, ethambutol 50m/kg body weight and rifabutin 150 to 300 mg a day.  But before that we will get LFTs, asked her to see ophthalmology for baseline eye checkup, and also  to correct her anemia .  Spoke to her oncologist and she will be seen in their clinic for transfusion either today or tomorrow.  Acute anemia : We will go to her oncologist office today  Right lung carcinoma.  Status post right lower lobectomy, radiation, recurrence, SBRT.  Followed by Dr. Yevette Edwards and Dr. Mortimer Fries.  COPD on inhalers and nebulizers Smoker: Quit smoking December 2019  ___________________________________________________ Discussed with patient in great detail.  Gave her material to read about the different antibiotics, other side effects.  She understands  the treatment is prolonged for up to a year or more and she will not have a dramatic improvement.  She will do some labs today and also have an ophthalmologist evaluate her baseline vision We will see her in 2 weeks for starting treatment  Note:  This document was prepared using Dragon voice recognition software and may include unintentional dictation errors.

## 2018-10-25 ENCOUNTER — Inpatient Hospital Stay: Payer: PPO

## 2018-10-25 ENCOUNTER — Encounter: Payer: Self-pay | Admitting: Infectious Diseases

## 2018-10-25 DIAGNOSIS — A310 Pulmonary mycobacterial infection: Secondary | ICD-10-CM | POA: Insufficient documentation

## 2018-10-25 DIAGNOSIS — C3411 Malignant neoplasm of upper lobe, right bronchus or lung: Secondary | ICD-10-CM | POA: Diagnosis not present

## 2018-10-25 DIAGNOSIS — D649 Anemia, unspecified: Secondary | ICD-10-CM

## 2018-10-25 HISTORY — DX: Pulmonary mycobacterial infection: A31.0

## 2018-10-25 MED ORDER — HEPARIN SOD (PORK) LOCK FLUSH 100 UNIT/ML IV SOLN
500.0000 [IU] | Freq: Once | INTRAVENOUS | Status: AC
  Administered 2018-10-25: 500 [IU] via INTRAVENOUS

## 2018-10-25 MED ORDER — DIPHENHYDRAMINE HCL 25 MG PO CAPS
25.0000 mg | ORAL_CAPSULE | Freq: Once | ORAL | Status: AC
  Administered 2018-10-25: 25 mg via ORAL

## 2018-10-25 MED ORDER — SODIUM CHLORIDE 0.9% IV SOLUTION
250.0000 mL | Freq: Once | INTRAVENOUS | Status: AC
  Administered 2018-10-25: 250 mL via INTRAVENOUS
  Filled 2018-10-25: qty 250

## 2018-10-25 MED ORDER — SODIUM CHLORIDE 0.9% FLUSH
10.0000 mL | INTRAVENOUS | Status: DC | PRN
  Filled 2018-10-25: qty 10

## 2018-10-25 MED ORDER — ACETAMINOPHEN 325 MG PO TABS
650.0000 mg | ORAL_TABLET | Freq: Once | ORAL | Status: AC
  Administered 2018-10-25: 650 mg via ORAL

## 2018-10-26 LAB — TYPE AND SCREEN
ABO/RH(D): A POS
Antibody Screen: NEGATIVE
Unit division: 0
Unit division: 0

## 2018-10-26 LAB — BPAM RBC
Blood Product Expiration Date: 202002202359
Blood Product Expiration Date: 202002202359
ISSUE DATE / TIME: 202001240927
ISSUE DATE / TIME: 202001241138
Unit Type and Rh: 6200
Unit Type and Rh: 6200

## 2018-10-27 LAB — PREPARE RBC (CROSSMATCH)

## 2018-10-30 LAB — MAC SUSCEPTIBILITY BROTH
Amikacin: 16
Ciprofloxacin: >16
Clarithromycin: 2
Ethambutol: 8
Linezolid: 64
Moxifloxacin: 8
Rifampin: 8
Streptomycin: 64

## 2018-10-30 LAB — AFB ORGANISM ID BY DNA PROBE
M avium complex: POSITIVE — AB
M tuberculosis complex: NEGATIVE

## 2018-10-30 LAB — ACID FAST CULTURE WITH REFLEXED SENSITIVITIES: ACID FAST CULTURE - AFSCU3: POSITIVE — AB

## 2018-10-30 LAB — ACID FAST CULTURE WITH REFLEXED SENSITIVITIES (MYCOBACTERIA)

## 2018-11-05 ENCOUNTER — Ambulatory Visit: Payer: PPO | Admitting: Infectious Diseases

## 2018-11-07 ENCOUNTER — Telehealth: Payer: Self-pay | Admitting: Licensed Clinical Social Worker

## 2018-11-07 ENCOUNTER — Ambulatory Visit
Admission: RE | Admit: 2018-11-07 | Discharge: 2018-11-07 | Disposition: A | Discharge status: Home / Self Care | Payer: PPO | Source: Ambulatory Visit | Attending: Infectious Diseases | Admitting: Infectious Diseases

## 2018-11-07 ENCOUNTER — Other Ambulatory Visit: Payer: Self-pay

## 2018-11-07 ENCOUNTER — Other Ambulatory Visit
Admission: RE | Admit: 2018-11-07 | Discharge: 2018-11-07 | Disposition: A | Discharge status: Home / Self Care | Payer: PPO | Source: Ambulatory Visit | Attending: Infectious Diseases | Admitting: Infectious Diseases

## 2018-11-07 ENCOUNTER — Ambulatory Visit: Payer: PPO | Attending: Infectious Diseases | Admitting: Infectious Diseases

## 2018-11-07 VITALS — BP 155/80 | HR 71 | Temp 98.6°F | Ht 61.0 in | Wt 95.0 lb

## 2018-11-07 DIAGNOSIS — J449 Chronic obstructive pulmonary disease, unspecified: Secondary | ICD-10-CM

## 2018-11-07 DIAGNOSIS — Z888 Allergy status to other drugs, medicaments and biological substances status: Secondary | ICD-10-CM

## 2018-11-07 DIAGNOSIS — A310 Pulmonary mycobacterial infection: Secondary | ICD-10-CM | POA: Diagnosis not present

## 2018-11-07 DIAGNOSIS — Z902 Acquired absence of lung [part of]: Secondary | ICD-10-CM | POA: Diagnosis not present

## 2018-11-07 DIAGNOSIS — E876 Hypokalemia: Secondary | ICD-10-CM | POA: Diagnosis not present

## 2018-11-07 DIAGNOSIS — R918 Other nonspecific abnormal finding of lung field: Secondary | ICD-10-CM

## 2018-11-07 DIAGNOSIS — Z85118 Personal history of other malignant neoplasm of bronchus and lung: Secondary | ICD-10-CM

## 2018-11-07 DIAGNOSIS — D62 Acute posthemorrhagic anemia: Secondary | ICD-10-CM

## 2018-11-07 DIAGNOSIS — Z9889 Other specified postprocedural states: Secondary | ICD-10-CM

## 2018-11-07 DIAGNOSIS — R9431 Abnormal electrocardiogram [ECG] [EKG]: Secondary | ICD-10-CM | POA: Diagnosis not present

## 2018-11-07 DIAGNOSIS — J700 Acute pulmonary manifestations due to radiation: Secondary | ICD-10-CM

## 2018-11-07 DIAGNOSIS — R05 Cough: Secondary | ICD-10-CM | POA: Diagnosis not present

## 2018-11-07 DIAGNOSIS — F17211 Nicotine dependence, cigarettes, in remission: Secondary | ICD-10-CM

## 2018-11-07 DIAGNOSIS — Z9221 Personal history of antineoplastic chemotherapy: Secondary | ICD-10-CM | POA: Diagnosis not present

## 2018-11-07 DIAGNOSIS — Z79899 Other long term (current) drug therapy: Secondary | ICD-10-CM | POA: Diagnosis not present

## 2018-11-07 DIAGNOSIS — I498 Other specified cardiac arrhythmias: Secondary | ICD-10-CM | POA: Diagnosis not present

## 2018-11-07 DIAGNOSIS — Z136 Encounter for screening for cardiovascular disorders: Secondary | ICD-10-CM | POA: Diagnosis not present

## 2018-11-07 LAB — CBC WITH DIFFERENTIAL/PLATELET
Abs Immature Granulocytes: 0.04 10*3/uL (ref 0.00–0.07)
BASOS ABS: 0.1 10*3/uL (ref 0.0–0.1)
Basophils Relative: 1 %
Eosinophils Absolute: 0.1 10*3/uL (ref 0.0–0.5)
Eosinophils Relative: 1 %
HCT: 29.3 % — ABNORMAL LOW (ref 36.0–46.0)
Hemoglobin: 8.5 g/dL — ABNORMAL LOW (ref 12.0–15.0)
IMMATURE GRANULOCYTES: 1 %
LYMPHS PCT: 14 %
Lymphs Abs: 1.2 10*3/uL (ref 0.7–4.0)
MCH: 24 pg — ABNORMAL LOW (ref 26.0–34.0)
MCHC: 29 g/dL — ABNORMAL LOW (ref 30.0–36.0)
MCV: 82.8 fL (ref 80.0–100.0)
Monocytes Absolute: 0.6 10*3/uL (ref 0.1–1.0)
Monocytes Relative: 7 %
NRBC: 0 % (ref 0.0–0.2)
Neutro Abs: 6.5 10*3/uL (ref 1.7–7.7)
Neutrophils Relative %: 76 %
PLATELETS: 623 10*3/uL — AB (ref 150–400)
RBC: 3.54 MIL/uL — AB (ref 3.87–5.11)
RDW: 19.5 % — ABNORMAL HIGH (ref 11.5–15.5)
WBC: 8.6 10*3/uL (ref 4.0–10.5)

## 2018-11-07 LAB — COMPREHENSIVE METABOLIC PANEL
ALT: 10 U/L (ref 0–44)
AST: 16 U/L (ref 15–41)
Albumin: 2.5 g/dL — ABNORMAL LOW (ref 3.5–5.0)
Alkaline Phosphatase: 120 U/L (ref 38–126)
Anion gap: 9 (ref 5–15)
BUN: 9 mg/dL (ref 8–23)
CHLORIDE: 101 mmol/L (ref 98–111)
CO2: 29 mmol/L (ref 22–32)
Calcium: 8.6 mg/dL — ABNORMAL LOW (ref 8.9–10.3)
Creatinine, Ser: 0.52 mg/dL (ref 0.44–1.00)
GFR calc Af Amer: >60 mL/min (ref >60)
GFR calc non Af Amer: >60 mL/min (ref >60)
Glucose, Bld: 100 mg/dL — ABNORMAL HIGH (ref 70–99)
Potassium: 2.7 mmol/L — CL (ref 3.5–5.1)
Sodium: 139 mmol/L (ref 135–145)
Total Bilirubin: 0.3 mg/dL (ref 0.3–1.2)
Total Protein: 6.9 g/dL (ref 6.5–8.1)

## 2018-11-07 MED ORDER — POTASSIUM CHLORIDE 20 MEQ PO PACK: 20.0000 meq | PACK | Freq: Two times a day (BID) | ORAL | 0 refills | Status: DC

## 2018-11-07 MED ORDER — AZITHROMYCIN 500 MG PO TABS: 500.0000 mg | ORAL_TABLET | Freq: Every day | ORAL | 0 refills | Status: DC

## 2018-11-07 MED ORDER — ETHAMBUTOL HCL 400 MG PO TABS: 15.0000 mg/kg | ORAL_TABLET | Freq: Every day | ORAL | 0 refills | Status: DC

## 2018-11-07 MED ORDER — BENZONATATE 100 MG PO CAPS: 100.0000 mg | ORAL_CAPSULE | Freq: Three times a day (TID) | ORAL | 0 refills | Status: DC | PRN

## 2018-11-07 MED ORDER — RIFABUTIN 150 MG PO CAPS: 300.0000 mg | ORAL_CAPSULE | Freq: Every day | ORAL | 0 refills | Status: DC

## 2018-11-07 NOTE — Telephone Encounter (Signed)
I called patient and she will restart her potassium. Per Dr. Delaine Lame take 40 meq today and tomorrow and go back to 20 after that. Patient agreed.

## 2018-11-07 NOTE — Progress Notes (Signed)
NAME: Claudia Chavez  DOB: March 31, 1956  MRN: [id number]  Date/Time: 11/07/2018 12:02 PM  Dr.Kasa Maretta Bees Subjective:  follow up visit to start MAC treatment ? Claudia Chavez is a 63 y.o.female  with a history of Lung cancer , COPD Has a  cavitary lung lesion and positive MAC in Bronch lavage culture and is here to start treatment- I saw her 2 weeks ago and the history is as follows Patient was diagnosed non-small cell right lung cancer in July 2016 and underwent 2 cycles of chemotherapy with Taxol and carboplatin  .     She developed  neuropathy.  And chemo was stopped.  She had right lower lobe resection  in November 2016 at [facility] by Dr. Faith Rogue.  This was followed by radiation therapy to the right lung in February 2017 for 30 days.  In May 2017 a repeat CAT scan as shown below was noted as interval right lower lobe resection without evidence of local recurrence or definite metastatic disease. I She was followed by oncology with serial CAT scans and PET scans. PET scan Feb 9th 2018- no evidence of recurrence; postoperative changes/stable right hilar lymph node without any activity noted.  On 05/25/17 CT showed partial cavitary right upper lobe pulmonary nodule, most consistent with metastatic disease versus metachronous primary. 3. Slight enlargement of a borderline pathologically sized right hilar lymph node. Indeterminate. Cannot exclude nodal metastasis.   CT of the chest repeated on October 25, 2017   Reported as similar size of the right upper lobe pulmonary nodule when comparing to the previous diagnostic CT chest of 05/25/2017, but in the interval, the lesion has become confluent in soft tissue attenuation suggesting interval progression.  She was seen pulmonologist Dr.Casa and underwent bronch and biopsy on 10/25/17.  Pathology report was VERY SCANT LUNG TISSUE WITH PIGMENTED MACROPHAGES. - NO NEOPLASM IS IDENTIFIED IN THIS SPECIMEN She received  SBRT  to the right lung X 5 doses-  in Feb 2019  CT of the chest from March 15 2018-showed interval development of a new 6 x 4 mm left upper lobe pulmonary nodule. Metastatic disease a concern. Metachronous primary lesion not excluded. 2. Right upper lobe pulmonary nodule seen on prior studies has become incorporated into an area of ill-defined interstitial and airspace disease in the right upper lung compatible with radiation treatment.    She was complaining of cough and productive sputum and she was treated with multiple courses of antibiotics including Levaquin.  A repeat CT scan was done in  Jan 2020 and it showed Thick-walled, partially cavitary process in the right lung apex measuring 5.4 x 7.4 cm (series 3/image 34), progressive, previously 4.2 x 2.9 cm.  Additional thick-walled cavitary process posteriorly in the right upper lobe measuring 8.1 x 5.5 cm (series 3/image 61), previously 7.7 x 5.1 cm when measured in a similar fashion.      She underwent bronchoscopy and bronchoalveolar lavage was sent for AFB and fungal and bacterial cultures.  The site allergy showed BENIGN ALVEOLATED LUNG PARENCHYMA AND BRONCHIAL WALL WITH FOCAL  ORGANIZING FIBRIN. - NO EVIDENCE OF TUMOR OR GRANULOMA.    AFB and GMS special stains were negative. The AFB nucleic acid amplification testing was done and that is positive for Mycobacterium avium intracellulare and she has been referred to me for treatment.   Patient has no fever or chills.  Her weight is stable she is petite and weighs around 90 pounds.  She has cough which is productive of purulent  sputum.  There is no obvious hemoptysis.  She says the antibiotics have not really helped much.  She lives on her own.  She was a smoker until December 2019.  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 Husband died in November 22, 2016 of lung cancer as well.  Past Medical History:  Diagnosis Date  . Cancer of lower lobe of right lung ([place]) 05/07/2015  . COPD (chronic obstructive  pulmonary disease) ([place])   . Dyspnea   . Non-small cell lung cancer ([place])   . Pneumonia 04/2015  . Pulmonary Mycobacterium avium complex (MAC) infection ([place]) 10/25/2018    Past Surgical History:  Procedure Laterality Date  . ABDOMINAL HYSTERECTOMY    . COLONOSCOPY WITH PROPOFOL N/A 06/09/2018   Procedure: COLONOSCOPY WITH PROPOFOL;  Surgeon: Toledo, Benay Pike, MD;  Location: [facility] ENDOSCOPY;  Service: Gastroenterology;  Laterality: N/A;  . ELBOW SURGERY Right 1995  . ELECTROMAGNETIC NAVIGATION BROCHOSCOPY N/A 10/25/2017   Procedure: ELECTROMAGNETIC NAVIGATION BRONCHOSCOPY;  Surgeon: [name], MD;  Location: [facility] ORS;  Service: Cardiopulmonary;  Laterality: N/A;  . ESOPHAGOGASTRODUODENOSCOPY N/A 06/08/2018   Procedure: ESOPHAGOGASTRODUODENOSCOPY (EGD);  Surgeon: Toledo, Benay Pike, MD;  Location: [facility] ENDOSCOPY;  Service: Gastroenterology;  Laterality: N/A;  . FLEXIBLE BRONCHOSCOPY Right 10/10/2018   Procedure: FLEXIBLE BRONCHOSCOPY;  Surgeon: [name], MD;  Location: [facility] ORS;  Service: Cardiopulmonary;  Laterality: Right;  . PORTACATH PLACEMENT Right 05/10/2015   Procedure: INSERTION PORT-A-CATH;  Surgeon: Nestor Lewandowsky, MD;  Location: [facility] ORS;  Service: General;  Laterality: Right;  Marland Kitchen VIDEO ASSISTED THORACOSCOPY (VATS)/THOROCOTOMY Right 08/18/2015   Procedure: VIDEO ASSISTED THORACOSCOPY (VATS)/THOROCOTOMY;  Surgeon: Nestor Lewandowsky, MD;  Location: [facility] ORS;  Service: General;  Laterality: Right;    Social History   Socioeconomic History  . Marital status: Widowed    Spouse name: Not on file  . Number of children: Not on file  . Years of education: Not on file  . Highest education level: Not on file  Occupational History  . Not on file  Social Needs  . Financial resource strain: Not on file  . Food insecurity:    Worry: Not on file    Inability: Not on file  . Transportation needs:    Medical: Not on file    Non-medical: Not on file  Tobacco Use  . Smoking status: Former Smoker      Packs/day: 0.50    Years: 40.00    Pack years: 20.00    Types: Cigarettes    Last attempt to quit: 09/12/2018    Years since quitting: 0.1  . Smokeless tobacco: Never Used  Substance and Sexual Activity  . Alcohol use: Not Currently    Alcohol/week: 2.0 - 4.0 standard drinks    Types: 2 - 4 Cans of beer per week    Comment: beer-occasionally  . Drug use: No  . Sexual activity: Never  Lifestyle  . Physical activity:    Days per week: Not on file    Minutes per session: Not on file  . Stress: Not on file  Relationships  . Social connections:    Talks on phone: Not on file    Gets together: Not on file    Attends religious service: Not on file    Active member of club or organization: Not on file    Attends meetings of clubs or organizations: Not on file    Relationship status: Not on file  . Intimate partner violence:    Fear of current or ex  partner: Not on file    Emotionally abused: Not on file    Physically abused: Not on file    Forced sexual activity: Not on file  Other Topics Concern  . Not on file  Social History Narrative  . Not on file    Family History  Problem Relation Age of Onset  . Stroke Mother   . Lung cancer Father 78   Allergies  Allergen Reactions  . Keppra [Levetiracetam] Other (See Comments)    Nausea and dizzy  . Lyrica [Pregabalin] Other (See Comments)    Made patient feel very dizzy and not feel good.     ? Current Outpatient Medications  Medication Sig Dispense Refill  . albuterol (PROVENTIL HFA;VENTOLIN HFA) 108 (90 Base) MCG/ACT inhaler Inhale 2 puffs into the lungs every 6 (six) hours as needed for wheezing or shortness of breath.     Marland Kitchen aspirin EC 81 MG tablet Take 81 mg by mouth daily.    . Cholecalciferol (VITAMIN D3) 1000 units CAPS Take 2,000 Units by mouth daily.     . fludrocortisone (FLORINEF) 0.1 MG tablet Take 0.1 mg by mouth daily.    . Fluticasone-Salmeterol (ADVAIR DISKUS) 250-50 MCG/DOSE AEPB Inhale 1 puff into the  lungs every 12 (twelve) hours.     Marland Kitchen guaiFENesin-codeine 100-10 MG/5ML syrup Take 5 mLs by mouth every 4 (four) hours as needed for cough. 120 mL 0  . ipratropium-albuterol (DUONEB) 0.5-2.5 (3) MG/3ML SOLN TAKE 3 MLS BY NEBULIZATION EVERY 4 (FOUR) HOURS AS NEEDED. (Patient taking differently: Take 3 mLs by nebulization 4 (four) times daily. ) 360 mL 3  . potassium chloride SA (K-DUR,KLOR-CON) 20 MEQ tablet Take 1.5 tablets (30 mEq total) by mouth daily. 40 tablet 3  . Respiratory Therapy Supplies (FLUTTER) DEVI 1 each by Does not apply route daily. 1 each 0  . venlafaxine XR (EFFEXOR-XR) 37.5 MG 24 hr capsule Take 37.5 mg by mouth daily with breakfast.     . vitamin B-12 (CYANOCOBALAMIN) 1000 MCG tablet Take 1,000 mcg by mouth daily.     No current facility-administered medications for this visit.    Facility-Administered Medications Ordered in Other Visits  Medication Dose Route Frequency Provider Last Rate Last Dose  . sodium chloride flush (NS) 0.9 % injection 10 mL  10 mL Intravenous PRN Cammie Sickle, MD         Abtx:  Anti-infectives (From admission, onward)   None      REVIEW OF SYSTEMS:  Const: negative fever, negative chills, steady weight of 96 pounds Eyes: negative diplopia or visual changes, negative eye pain ENT: negative coryza, negative sore throat Resp: Positive cough, no hemoptysis, has dyspnea Cards: negative for chest pain, palpitations, lower extremity edema GU: negative for frequency, dysuria and hematuria GI: Negative for abdominal pain, diarrhea, bleeding, constipation Skin: negative for rash and pruritus Heme: negative for easy bruising and gum/nose bleeding MS: Generalized fatigue and weakness Neurolo:negative for headaches, has dizziness, vertigo, memory problems  Psych:  anxiety, depression  Endocrine: No polyuria or polydipsia Allergy/Immunology-allergy to Keppra and Lyrica.  Has side effects like dizziness and nausea Objective:  VITALS:  BP  (!) 155/80 (BP Location: Left Arm, Patient Position: Sitting, Cuff Size: Normal)   Pulse 71   Temp 98.6 F (37 C) (Oral)   Ht _0  (1.549 m)   Wt 95 lb (43.1 kg)   BMI 17.95 kg/m  PHYSICAL EXAM:  General: Alert, cooperative, no distress, appears stated age.  Pale,thin built Head:  Normocephalic, without obvious abnormality, atraumatic. Eyes: Conjunctivae clear, anicteric sclerae. Pupils are equal ENT Nares normal. No drainage or sinus tenderness. Lips, mucosa, and tongue normal. No Thrush Neck: Supple, symmetrical, no adenopathy, thyroid: non tender no carotid bruit and no JVD. Back: No CVA tenderness. Lungs: Bilateral air entry crepitations present on the right side Heart: S1-S2 Abdomen: Soft, non-tender,not distended. Bowel sounds normal. No masses Extremities: atraumatic, no cyanosis. No edema. No clubbing Skin: No rashes or lesions. Or bruising Lymph: Cervical, supraclavicular normal. Neurologic: Grossly non-focal Pertinent Labs Lab Results CBC    Component Value Date/Time   WBC 9.5 09/04/2018 1002   RBC 3.50 (L) 09/04/2018 1002   HGB 9.1 (L) 09/04/2018 1002   HCT 29.6 (L) 09/04/2018 1002   PLT 522 (H) 09/04/2018 1002   MCV 84.6 09/04/2018 1002   MCH 26.0 09/04/2018 1002   MCHC 30.7 09/04/2018 1002   RDW 15.2 09/04/2018 1002   LYMPHSABS 1.1 09/04/2018 1002   MONOABS 0.5 09/04/2018 1002   EOSABS 0.1 09/04/2018 1002   BASOSABS 0.1 09/04/2018 1002    CMP Latest Ref Rng & Units 09/04/2018 08/07/2018 07/30/2018  Glucose 70 - 99 mg/dL 93 100(H) 84  BUN 8 - 23 mg/dL _0 Creatinine 0.44 - 1.00 mg/dL 0.66 0.53 0.61  Sodium 135 - 145 mmol/L 133(L) 134(L) 137  Potassium 3.5 - 5.1 mmol/L 3.5 3.8 4.7  Chloride 98 - 111 mmol/L 97(L) 103 101  CO2 22 - 32 mmol/L _1 Calcium 8.9 - 10.3 mg/dL 8.9 9.4 9.1  Total Protein 6.5 - 8.1 g/dL - - -  Total Bilirubin 0.3 - 1.2 mg/dL - - -  Alkaline Phos 38 - 126 U/L - - -  AST 15 - 41 U/L - - -  ALT 0 - 44 U/L - - -       Microbiology: 10/10/2018 acid-fast organism ID by nucleic acid amplification test is M avium complex. IMAGING RESULTS: ?As above Impression/Recommendation 63 y.o.female  with a history of Lung cancer , COPD  ? ? Cavitary lesions in the right lung.  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  It is difficult to say whether MAC is a true pathogen or colonization.    because of her underlying lung pathology and progression, , COPD, thin build .we need to treat this as as a pathogen /pulmonary MAC infection.  She will require 2-3 drug combination.  The susceptibilities still pending.  But usually MAC is sensitive to macrolide like azithromycin or clarithromycin along with  oral rifampin and ethambutol. Because of some drugs that she is taking there  is interaction with  rifampin .  Clarithromycin also has interactions. So would give her azithromycin  500 mg once a day, ethambutol 21m/kg body weight (6038m and rifabutin  300 mg a day.   will get LFTs, today- she discussed with  her  Ophthalmologist regarding ethambutol and visual acuity and color vision and was given the okay to take ethambutol.    Acute anemia :s/p PRBC 2 units recently   Right lung carcinoma.  Status post right lower lobectomy, radiation, recurrence, SBRT.  Followed by Dr. BrYevette Edwardsnd Dr. KaMortimer Fries COPD on inhalers and nebulizers Smoker: Quit smoking December 2019  ___________________________________________________ Discussed with patient in great detail. I had given  her material to read about the different antibiotics,  side effects and she did go thru them  She understands the treatment is prolonged for up to  a year or more and she will not have a dramatic improvement.  Will start he ron the higher dose of azithromycin/rifabutin and ethambutol- if she does not tolerate can reduce azithromycin to 242m and Rifabutin to 1557m  Will send cmp today  P.S K came back as 2.7-  called patient- she has chronic hypokalemia and was given Potassium prescription which she is not taking currently- asked her to take 4064m/day for 2-3 days and then reduce to 77m8masked her to follow with her PCP

## 2018-11-07 NOTE — Telephone Encounter (Signed)
Will check her K again on Monday

## 2018-11-07 NOTE — Telephone Encounter (Signed)
Will call in K and please ask patient to follow up with PCP

## 2018-11-07 NOTE — Patient Instructions (Addendum)
You are here to start treatment for pulmonary MAC infection- today e ill star you on azithromycin 541m one a day Ethambutol 6038m( it comes as 40022mablet- so take 1 1/2 tablet)  Rifabutin 300m56mit comes as 150mg26mlet- so take 2) Sometimes this may be too strong for you and you can have some GI side effects- if that is the case  We can reduce the dose to half of azithromycin and half of rifabutin)  You will let me know first if you experience any side effects, before we make changes to your dose. We need to get a baseline EKG, Liver/Kidney test and CBC I understand you have checked with your eye doctor regarding color vision and baseline visual acuity. Will see you back in 3-4 weeks and will repeat your labs then

## 2018-11-07 NOTE — Telephone Encounter (Signed)
Critical lab called in for potassium at 2.7, provider notified.

## 2018-11-11 ENCOUNTER — Other Ambulatory Visit: Payer: Self-pay | Admitting: Infectious Diseases

## 2018-11-11 ENCOUNTER — Other Ambulatory Visit: Payer: Self-pay | Admitting: Licensed Clinical Social Worker

## 2018-11-11 DIAGNOSIS — E876 Hypokalemia: Secondary | ICD-10-CM

## 2018-11-11 MED ORDER — POTASSIUM CHLORIDE CRYS ER 20 MEQ PO TBCR: 30.0000 meq | EXTENDED_RELEASE_TABLET | Freq: Every day | ORAL | 3 refills | Status: DC

## 2018-11-11 NOTE — Progress Notes (Signed)
Placed an order for checking potassium

## 2018-11-14 ENCOUNTER — Ambulatory Visit: Payer: PPO

## 2018-11-28 ENCOUNTER — Ambulatory Visit: Payer: PPO | Attending: Infectious Diseases | Admitting: Infectious Diseases

## 2018-11-28 ENCOUNTER — Other Ambulatory Visit: Payer: Self-pay | Admitting: Infectious Diseases

## 2018-11-28 ENCOUNTER — Encounter: Payer: Self-pay | Admitting: Infectious Diseases

## 2018-11-28 ENCOUNTER — Other Ambulatory Visit
Admission: RE | Admit: 2018-11-28 | Discharge: 2018-11-28 | Disposition: A | Discharge status: Home / Self Care | Payer: PPO | Source: Ambulatory Visit | Attending: Infectious Diseases | Admitting: Infectious Diseases

## 2018-11-28 ENCOUNTER — Telehealth: Payer: Self-pay | Admitting: Licensed Clinical Social Worker

## 2018-11-28 VITALS — BP 178/80 | HR 71 | Temp 97.9°F | Wt 92.0 lb

## 2018-11-28 DIAGNOSIS — E876 Hypokalemia: Secondary | ICD-10-CM

## 2018-11-28 DIAGNOSIS — Z923 Personal history of irradiation: Secondary | ICD-10-CM

## 2018-11-28 DIAGNOSIS — F17211 Nicotine dependence, cigarettes, in remission: Secondary | ICD-10-CM | POA: Diagnosis not present

## 2018-11-28 DIAGNOSIS — Z85118 Personal history of other malignant neoplasm of bronchus and lung: Secondary | ICD-10-CM

## 2018-11-28 DIAGNOSIS — J700 Acute pulmonary manifestations due to radiation: Secondary | ICD-10-CM | POA: Diagnosis not present

## 2018-11-28 DIAGNOSIS — F329 Major depressive disorder, single episode, unspecified: Secondary | ICD-10-CM | POA: Diagnosis not present

## 2018-11-28 DIAGNOSIS — D649 Anemia, unspecified: Secondary | ICD-10-CM

## 2018-11-28 DIAGNOSIS — J449 Chronic obstructive pulmonary disease, unspecified: Secondary | ICD-10-CM | POA: Diagnosis not present

## 2018-11-28 DIAGNOSIS — F419 Anxiety disorder, unspecified: Secondary | ICD-10-CM

## 2018-11-28 DIAGNOSIS — Z792 Long term (current) use of antibiotics: Secondary | ICD-10-CM

## 2018-11-28 DIAGNOSIS — Z888 Allergy status to other drugs, medicaments and biological substances status: Secondary | ICD-10-CM

## 2018-11-28 DIAGNOSIS — Z9889 Other specified postprocedural states: Secondary | ICD-10-CM | POA: Diagnosis not present

## 2018-11-28 DIAGNOSIS — Z9221 Personal history of antineoplastic chemotherapy: Secondary | ICD-10-CM | POA: Diagnosis not present

## 2018-11-28 DIAGNOSIS — A310 Pulmonary mycobacterial infection: Secondary | ICD-10-CM | POA: Insufficient documentation

## 2018-11-28 DIAGNOSIS — Z902 Acquired absence of lung [part of]: Secondary | ICD-10-CM | POA: Diagnosis not present

## 2018-11-28 LAB — CBC WITH DIFFERENTIAL/PLATELET
Abs Immature Granulocytes: 0.01 10*3/uL (ref 0.00–0.07)
Basophils Absolute: 0.1 10*3/uL (ref 0.0–0.1)
Basophils Relative: 2 %
Eosinophils Absolute: 0.1 10*3/uL (ref 0.0–0.5)
Eosinophils Relative: 2 %
HCT: 28.2 % — ABNORMAL LOW (ref 36.0–46.0)
Hemoglobin: 8.3 g/dL — ABNORMAL LOW (ref 12.0–15.0)
Immature Granulocytes: 0 %
LYMPHS ABS: 1.1 10*3/uL (ref 0.7–4.0)
Lymphocytes Relative: 18 %
MCH: 24.2 pg — ABNORMAL LOW (ref 26.0–34.0)
MCHC: 29.4 g/dL — ABNORMAL LOW (ref 30.0–36.0)
MCV: 82.2 fL (ref 80.0–100.0)
MONOS PCT: 8 %
Monocytes Absolute: 0.5 10*3/uL (ref 0.1–1.0)
NEUTROS PCT: 70 %
Neutro Abs: 4.3 10*3/uL (ref 1.7–7.7)
Platelets: 494 10*3/uL — ABNORMAL HIGH (ref 150–400)
RBC: 3.43 MIL/uL — ABNORMAL LOW (ref 3.87–5.11)
RDW: 19.9 % — ABNORMAL HIGH (ref 11.5–15.5)
WBC: 6.1 10*3/uL (ref 4.0–10.5)
nRBC: 0 % (ref 0.0–0.2)

## 2018-11-28 LAB — SEDIMENTATION RATE: Sed Rate: 92 mm/hr — ABNORMAL HIGH (ref 0–30)

## 2018-11-28 LAB — COMPREHENSIVE METABOLIC PANEL
ALT: 10 U/L (ref 0–44)
AST: 14 U/L — ABNORMAL LOW (ref 15–41)
Albumin: 3 g/dL — ABNORMAL LOW (ref 3.5–5.0)
Alkaline Phosphatase: 111 U/L (ref 38–126)
Anion gap: 8 (ref 5–15)
BUN: 8 mg/dL (ref 8–23)
CALCIUM: 8.5 mg/dL — AB (ref 8.9–10.3)
CO2: 26 mmol/L (ref 22–32)
Chloride: 102 mmol/L (ref 98–111)
Creatinine, Ser: 0.53 mg/dL (ref 0.44–1.00)
GFR calc Af Amer: >60 mL/min (ref >60)
GFR calc non Af Amer: >60 mL/min (ref >60)
GLUCOSE: 95 mg/dL (ref 70–99)
Potassium: 2.6 mmol/L — CL (ref 3.5–5.1)
Sodium: 136 mmol/L (ref 135–145)
Total Bilirubin: 0.4 mg/dL (ref 0.3–1.2)
Total Protein: 7.5 g/dL (ref 6.5–8.1)

## 2018-11-28 MED ORDER — AZITHROMYCIN 500 MG PO TABS: 500.0000 mg | ORAL_TABLET | Freq: Every day | ORAL | 1 refills | Status: DC

## 2018-11-28 MED ORDER — ETHAMBUTOL HCL 400 MG PO TABS: 15.0000 mg/kg | ORAL_TABLET | Freq: Every day | ORAL | 1 refills | Status: DC

## 2018-11-28 MED ORDER — RIFABUTIN 150 MG PO CAPS: 300.0000 mg | ORAL_CAPSULE | Freq: Every day | ORAL | 1 refills | Status: DC

## 2018-11-28 MED ORDER — GUAIFENESIN ER 600 MG PO TB12: 600.0000 mg | ORAL_TABLET | Freq: Two times a day (BID) | ORAL | 0 refills | Status: DC

## 2018-11-28 NOTE — Telephone Encounter (Signed)
Patient had labs drawn today, and a critical value for potassium of 2.6 was called in from the lab. Patient was notified and advised to take 3 potassium pills of 20 meq daily until she goes to see her pcp next week.

## 2018-11-28 NOTE — Progress Notes (Signed)
NAME: Claudia Chavez  DOB: 1956-04-02  MRN: [id number]  Date/Time: 11/28/2018 9:29 AM  Dr.Kasa Maretta Bees Subjective:  follow up visit after starting MAC treatment Started MAC treatment 2 weeks ago Azithromycin 500 mg once a day, ethambutol 95m/kg body weight (6050m and rifabutin  300 mg a day.  She has no side effects from the meds like diarrhea, abdominal apin or nausea or rash. Having more energy- cough has changed- more persistent , but unable to bring up phlegm Had low K last visit and was asked to double the dose of K- she did not go for repeat labs ? Has a  history of Lung cancer , COPD Patient was diagnosed non-small cell right lung cancer in July 2016 and underwent 2 cycles of chemotherapy with Taxol and carboplatin  .     She developed  neuropathy.  And chemo was stopped.  [facility] OaFaith Rogue This was followed by radiation therapy to the right lung in February 2017 for 30 days.  In May 2017 a repeat CAT scan as shown below was noted as interval right lower lobe resection without evidence of local recurrence or definite metastatic disease. I She was followed by oncology with serial CAT scans and PET scans. PET scan Feb 9th 2018- no evidence of recurrence; postoperative changes/stable right hilar lymph node without any activity noted.  On 05/25/17 CT showed partial cavitary right upper lobe pulmonary nodule, most consistent with metastatic disease versus metachronous primary. 3. Slight enlargement of a borderline pathologically sized right hilar lymph node. Indeterminate. Cannot exclude nodal metastasis.   CT of the chest repeated on October 25, 2017   Reported as similar size of the right upper lobe pulmonary nodule when comparing to the previous diagnostic CT chest of 05/25/2017, but in the interval, the lesion has become confluent in soft tissue attenuation suggesting interval progression.  She was seen pulmonologist  Dr.Casa and underwent bronch and biopsy on 10/25/17.  Pathology report was VERY SCANT LUNG TISSUE WITH PIGMENTED MACROPHAGES. - NO NEOPLASM IS IDENTIFIED IN THIS SPECIMEN She received  SBRT  to the right lung X 5 doses- in Feb 2019  CT of the chest from March 15 2018-showed interval development of a new 6 x 4 mm left upper lobe pulmonary nodule. Metastatic disease a concern. Metachronous primary lesion not excluded. 2. Right upper lobe pulmonary nodule seen on prior studies has become incorporated into an area of ill-defined interstitial and airspace disease in the right upper lung compatible with radiation treatment.    She was complaining of cough and productive sputum and she was treated with multiple courses of antibiotics including Levaquin.  A repeat CT scan was done in  Jan 2020 and it showed Thick-walled, partially cavitary process in the right lung apex measuring 5.4 x 7.4 cm (series 3/image 34), progressive, previously 4.2 x 2.9 cm.  Additional thick-walled cavitary process posteriorly in the right upper lobe measuring 8.1 x 5.5 cm (series 3/image 61), previously 7.7 x 5.1 cm when measured in a similar fashion.      She underwent bronchoscopy and bronchoalveolar lavage was sent for AFB and fungal and bacterial cultures.  The site allergy showed BENIGN ALVEOLATED LUNG PARENCHYMA AND BRONCHIAL WALL WITH FOCAL  ORGANIZING FIBRIN. - NO EVIDENCE OF TUMOR OR GRANULOMA.    AFB and GMS special stains were negative. The AFB nucleic acid amplification testing was done and that is positive for Mycobacterium avium intracellulare  and she has been referred to me for treatment.   Patient has no fever or chills.  Her weight is stable she is petite and weighs around 90 pounds.  She has cough which is productive of purulent sputum.  There is no obvious hemoptysis.  She says the antibiotics have not really helped much.  She lives on her own.  She was a smoker until December 2019.  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 Husband died in 12/13/16 of lung cancer as well.  Past Medical History:  Diagnosis Date  . Cancer of lower lobe of right lung ([place]) 05/07/2015  . COPD (chronic obstructive pulmonary disease) ([place])   . Dyspnea   . Non-small cell lung cancer ([place])   . Pneumonia 04/2015  . Pulmonary Mycobacterium avium complex (MAC) infection ([place]) 10/25/2018    Past Surgical History:  Procedure Laterality Date  . ABDOMINAL HYSTERECTOMY    . COLONOSCOPY WITH PROPOFOL N/A 06/09/2018   Procedure: COLONOSCOPY WITH PROPOFOL;  Surgeon: Toledo, Benay Pike, MD;  Location: [facility] END[facility]COPY;  Service: Gastroenterology;  Laterality: N/A;  . ELBOW SURGERY Right 1995  . ELECTROMAGNETIC NAVIGATION BROCHOSCOPY N/A 10/25/2017   Procedure: ELECTROMAGNETIC NAVIGATION BRONCHOSCOPY;  Surgeon: [name], MD;  Location: [facility] ORS;  Service: Cardiopulmonary;  Laterality: N/A;  . ESOPHAGOGASTRODUODENOSCOPY N/A 06/08/2018   Procedure: ESOPHAGOGASTRODUODENOSCOPY (EGD);  Surgeon: Toledo, Benay Pike, MD;  Location: [facility] END[facility];  Service: Gastroenterology;  Laterality: N/A;  . FLEXIBLE BRONCHOSCOPY Right 10/10/2018   Procedure: FLEXIBLE BRONCHOSCOPY;  Surgeon: [name], MD;  Location: [facility] OR[facility];  Service: Cardiopulmonary;  Laterality: Right;  . PORTACATH PLACEMENT Right 05/10/2015   Procedure: INSERTION PORT-A-CATH;  Surgeon: Nestor Lewandowsky, MD;  Location: [facility] OR[facility];  Service: General;  Laterality: Right;  Marland Kitchen VIDEO ASSISTED THORACOSCOPY (VATS)/THOROCOTOMY Right 08/18/2015   Procedure: VIDEO ASSISTED THORACOSCOPY (VATS)/THOROCOTOMY;  Surgeon: Nestor Lewandowsky, MD;  Location: [facility];  Service: General;  Laterality: Right;    Social History   Socioeconomic History  . Marital status: Widowed    Spouse name: Not on file  . Number of children: Not on file  . Years of education: Not on file  . Highest education level: Not on file  Occupational History  . Not on file  Social Needs  . Financial resource strain:  Not on file  . Food insecurity:    Worry: Not on file    Inability: Not on file  . Transportation needs:    Medical: Not on file    Non-medical: Not on file  Tobacco Use  . Smoking status: Former Smoker    Packs/day: 0.50    Years: 40.00    Pack years: 20.00    Types: Cigarettes    Last attempt to quit: 09/12/2018    Years since quitting: 0.2  . Smokeless tobacco: Never Used  Substance and Sexual Activity  . Alcohol use: Not Currently    Alcohol/week: 2.0 - 4.0 standard drinks    Types: 2 - 4 Cans of beer per week    Comment: beer-occasionally  . Drug use: No  . Sexual activity: Never  Lifestyle  . Physical activity:    Days per week: Not on file    Minutes per session: Not on file  . Stress: Not on file  Relationships  . Social connections:    Talks on phone: Not on file    Gets together: Not on file    Attends religious service: Not on file    Active member of club  or organization: Not on file    Attends meetings of clubs or organizations: Not on file    Relationship status: Not on file  . Intimate partner violence:    Fear of current or ex partner: Not on file    Emotionally abused: Not on file    Physically abused: Not on file    Forced sexual activity: Not on file  Other Topics Concern  . Not on file  Social History Narrative  . Not on file    Family History  Problem Relation Age of Onset  . Stroke Mother   . Lung cancer Father 53   Allergies  Allergen Reactions  . Keppra [Levetiracetam] Other (See Comments)    Nausea and dizzy  . Lyrica [Pregabalin] Other (See Comments)    Made patient feel very dizzy and not feel good.     ? Current Outpatient Medications  Medication Sig Dispense Refill  . albuterol (PROVENTIL HFA;VENTOLIN HFA) 108 (90 Base) MCG/ACT inhaler Inhale 2 puffs into the lungs every 6 (six) hours as needed for wheezing or shortness of breath.     Marland Kitchen aspirin EC 81 MG tablet Take 81 mg by mouth daily.    Marland Kitchen azithromycin (ZITHROMAX) 500 MG  tablet Take 1 tablet (500 mg total) by mouth daily. 30 tablet 0  . benzonatate (TESSALON PERLES) 100 MG capsule Take 1 capsule (100 mg total) by mouth 3 (three) times daily as needed for cough. 30 capsule 0  . Cholecalciferol (VITAMIN D3) 1000 units CAPS Take 2,000 Units by mouth daily.     Marland Kitchen ethambutol (MYAMBUTOL) 400 MG tablet Take 1.5 tablets (600 mg total) by mouth daily. 45 tablet 0  . fludrocortisone (FLORINEF) 0.1 MG tablet Take 0.1 mg by mouth daily.    . Fluticasone-Salmeterol (ADVAIR DISKUS) 250-50 MCG/DOSE AEPB Inhale 1 puff into the lungs every 12 (twelve) hours.     Marland Kitchen guaiFENesin-codeine 100-10 MG/5ML syrup Take 5 mLs by mouth every 4 (four) hours as needed for cough. 120 mL 0  . ipratropium-albuterol (DUONEB) 0.5-2.5 (3) MG/3ML SOLN TAKE 3 MLS BY NEBULIZATION EVERY 4 (FOUR) HOURS AS NEEDED. (Patient taking differently: Take 3 mLs by nebulization 4 (four) times daily. ) 360 mL 3  . potassium chloride SA (K-DUR,KLOR-CON) 20 MEQ tablet Take 1.5 tablets (30 mEq total) by mouth daily. 40 tablet 3  . Respiratory Therapy Supplies (FLUTTER) DEVI 1 each by Does not apply route daily. 1 each 0  . rifabutin (MYCOBUTIN) 150 MG capsule Take 2 capsules (300 mg total) by mouth daily. 60 capsule 0  . venlafaxine XR (EFFEXOR-XR) 37.5 MG 24 hr capsule Take 37.5 mg by mouth daily with breakfast.     . vitamin B-12 (CYANOCOBALAMIN) 1000 MCG tablet Take 1,000 mcg by mouth daily.     No current facility-administered medications for this visit.    Facility-Administered Medications Ordered in Other Visits  Medication Dose Route Frequency Provider Last Rate Last Dose  . sodium chloride flush (NS) 0.9 % injection 10 mL  10 mL Intravenous PRN Cammie Sickle, MD         Abtx:  Anti-infectives (From admission, onward)   None      REVIEW OF SYSTEMS:  Const: negative fever, negative chills, steady weight of 96 pounds Eyes: negative diplopia or visual changes, negative eye pain ENT: negative  coryza, negative sore throat Resp: Positive cough, no hemoptysis, has dyspnea Cards: negative for chest pain, palpitations, lower extremity edema GU: negative for frequency, dysuria and hematuria GI:  Negative for abdominal pain, diarrhea, bleeding, constipation Skin: negative for rash and pruritus Heme: negative for easy bruising and gum/nose bleeding MS: Generalized fatigue and weakness Neurolo:negative for headaches, has dizziness, vertigo, memory problems  Psych:  anxiety, depression  Endocrine: No polyuria or polydipsia Allergy/Immunology-allergy to Keppra and Lyrica.  Has side effects like dizziness and nausea Objective:  VITALS:  BP (!) 178/80 (BP Location: Left Arm, Patient Position: Sitting, Cuff Size: Normal)   Pulse 71   Temp 97.9 F (36.6 C) (Oral)   Wt 92 lb (41.7 kg)   BMI 17.38 kg/m PHYSICAL EXAM:  General: Alert, cooperative, no distress, appears stated age.  Pale,thin built Head: Normocephalic, without obvious abnormality, atraumatic. Eyes: Conjunctivae clear, anicteric sclerae. Pupils are equal ENT Nares normal. No drainage or sinus tenderness. Lips, mucosa, and tongue normal. No Thrush Neck: Supple, symmetrical, no adenopathy, thyroid: non tender no carotid bruit and no JVD. Back: No CVA tenderness. Lungs: Bilateral air entry crepitations present on the right side Heart: S1-S2 Abdomen: Soft, non-tender,not distended. Bowel sounds normal. No masses Extremities: atraumatic, no cyanosis. No edema. No clubbing Skin: No rashes or lesions. Or bruising Lymph: Cervical, supraclavicular normal. Neurologic: Grossly non-focal Pertinent Labs Lab Results CBC    Component Value Date/Time   WBC 8.6 11/07/2018 1312   RBC 3.54 (L) 11/07/2018 1312   HGB 8.5 (L) 11/07/2018 1312   HCT 29.3 (L) 11/07/2018 1312   PLT 623 (H) 11/07/2018 1312   MCV 82.8 11/07/2018 1312   MCH 24.0 (L) 11/07/2018 1312   MCHC 29.0 (L) 11/07/2018 1312   RDW 19.5 (H) 11/07/2018 1312   LYMPHSABS  1.2 11/07/2018 1312   MONOABS 0.6 11/07/2018 1312   EOSABS 0.1 11/07/2018 1312   BASOSABS 0.1 11/07/2018 1312    CMP Latest Ref Rng & Units 11/07/2018 09/04/2018 08/07/2018  Glucose 70 - 99 mg/dL 100(H) 93 100(H)  BUN 8 - 23 mg/dL _0 Creatinine 0.44 - 1.00 mg/dL 0.52 0.66 0.53  Sodium 135 - 145 mmol/L 139 133(L) 134(L)  Potassium 3.5 - 5.1 mmol/L 2.7(LL) 3.5 3.8  Chloride 98 - 111 mmol/L 101 97(L) 103  CO2 22 - 32 mmol/L _1 Calcium 8.9 - 10.3 mg/dL 8.6(L) 8.9 9.4  Total Protein 6.5 - 8.1 g/dL 6.9 - -  Total Bilirubin 0.3 - 1.2 mg/dL 0.3 - -  Alkaline Phos 38 - 126 U/L 120 - -  AST 15 - 41 U/L 16 - -  ALT 0 - 44 U/L 10 - -      Microbiology: 10/10/2018 acid-fast organism ID by nucleic acid amplification test is M avium complex.  IMAGING RESULTS: ?As above Impression/Recommendation 63 y.o.female  with a history of Lung cancer , COPD  ? ? Cavitary lesions in the right lung.  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  It is difficult to say whether MAC is a true pathogen or colonization.    because of her underlying lung pathology and progression, , COPD, thin build .weare treating  this as as a pathogen /pulmonary MAC infection.  She is on 3 drug combination. On  azithromycin  500 mg once a day, ethambutol 65m/kg body weight (6072m and rifabutin  300 mg a day.   CMP will be repeated today Will give mucinex     Acute anemia :s/p PRBC 2 units recently   Right lung carcinoma.  Status post right lower lobectomy, radiation, recurrence, SBRT.  Followed by Dr. BrYevette Edwardsnd  Dr. Mortimer Fries. Will need a repeat CT in 3 months to see whether there is improvement with MAC treatment  COPD on inhalers and nebulizers Smoker: Quit smoking December 2019  Hypokalemia- will repeat labs today  ___________________________________________________  P.S K came back as 2.6- called her and asked to take 59mq /day For 4 days and go see her PCP for  repeat labs

## 2018-12-04 ENCOUNTER — Inpatient Hospital Stay (HOSPITAL_BASED_OUTPATIENT_CLINIC_OR_DEPARTMENT_OTHER): Payer: PPO | Admitting: Internal Medicine

## 2018-12-04 ENCOUNTER — Encounter: Payer: Self-pay | Admitting: Internal Medicine

## 2018-12-04 ENCOUNTER — Inpatient Hospital Stay: Payer: PPO | Attending: Internal Medicine

## 2018-12-04 VITALS — BP 167/79 | HR 92 | Temp 98.0°F | Resp 16 | Wt 93.4 lb

## 2018-12-04 DIAGNOSIS — C3492 Malignant neoplasm of unspecified part of left bronchus or lung: Secondary | ICD-10-CM | POA: Insufficient documentation

## 2018-12-04 DIAGNOSIS — E876 Hypokalemia: Secondary | ICD-10-CM

## 2018-12-04 DIAGNOSIS — R062 Wheezing: Secondary | ICD-10-CM | POA: Diagnosis not present

## 2018-12-04 DIAGNOSIS — D649 Anemia, unspecified: Secondary | ICD-10-CM

## 2018-12-04 DIAGNOSIS — I82402 Acute embolism and thrombosis of unspecified deep veins of left lower extremity: Secondary | ICD-10-CM | POA: Insufficient documentation

## 2018-12-04 DIAGNOSIS — Z7982 Long term (current) use of aspirin: Secondary | ICD-10-CM

## 2018-12-04 DIAGNOSIS — Z87891 Personal history of nicotine dependence: Secondary | ICD-10-CM | POA: Diagnosis not present

## 2018-12-04 DIAGNOSIS — G939 Disorder of brain, unspecified: Secondary | ICD-10-CM | POA: Diagnosis not present

## 2018-12-04 DIAGNOSIS — C3411 Malignant neoplasm of upper lobe, right bronchus or lung: Secondary | ICD-10-CM

## 2018-12-04 DIAGNOSIS — R0602 Shortness of breath: Secondary | ICD-10-CM

## 2018-12-04 LAB — BASIC METABOLIC PANEL
ANION GAP: 9 (ref 5–15)
BUN: 13 mg/dL (ref 8–23)
CALCIUM: 9.1 mg/dL (ref 8.9–10.3)
CO2: 26 mmol/L (ref 22–32)
Chloride: 101 mmol/L (ref 98–111)
Creatinine, Ser: 0.58 mg/dL (ref 0.44–1.00)
GFR calc non Af Amer: >60 mL/min (ref >60)
Glucose, Bld: 98 mg/dL (ref 70–99)
Potassium: 3.9 mmol/L (ref 3.5–5.1)
SODIUM: 136 mmol/L (ref 135–145)

## 2018-12-04 LAB — CBC WITH DIFFERENTIAL/PLATELET
ABS IMMATURE GRANULOCYTES: 0.02 10*3/uL (ref 0.00–0.07)
Basophils Absolute: 0.1 10*3/uL (ref 0.0–0.1)
Basophils Relative: 1 %
Eosinophils Absolute: 0.3 10*3/uL (ref 0.0–0.5)
Eosinophils Relative: 5 %
HCT: 28.9 % — ABNORMAL LOW (ref 36.0–46.0)
HEMOGLOBIN: 8.5 g/dL — AB (ref 12.0–15.0)
Immature Granulocytes: 0 %
Lymphocytes Relative: 17 %
Lymphs Abs: 1 10*3/uL (ref 0.7–4.0)
MCH: 24.4 pg — ABNORMAL LOW (ref 26.0–34.0)
MCHC: 29.4 g/dL — ABNORMAL LOW (ref 30.0–36.0)
MCV: 82.8 fL (ref 80.0–100.0)
Monocytes Absolute: 0.5 10*3/uL (ref 0.1–1.0)
Monocytes Relative: 8 %
Neutro Abs: 4 10*3/uL (ref 1.7–7.7)
Neutrophils Relative %: 69 %
Platelets: 500 10*3/uL — ABNORMAL HIGH (ref 150–400)
RBC: 3.49 MIL/uL — ABNORMAL LOW (ref 3.87–5.11)
RDW: 19.7 % — ABNORMAL HIGH (ref 11.5–15.5)
WBC: 5.8 10*3/uL (ref 4.0–10.5)
nRBC: 0 % (ref 0.0–0.2)

## 2018-12-04 MED ORDER — BENZONATATE 100 MG PO CAPS: 100.0000 mg | ORAL_CAPSULE | Freq: Three times a day (TID) | ORAL | 0 refills | Status: DC | PRN

## 2018-12-04 NOTE — Progress Notes (Signed)
[place] OFFICE PROGRESS NOTE  Patient Care Team: [place] Pink, MD as PCP - General (Family Medicine) Cammie Sickle, MD as Medical Oncologist (Medical Oncology) Rockey Situ, Kathlene November, MD as Consulting Physician (Cardiology) Efrain Sella, MD as Consulting Physician (Gastroenterology) Noreene Filbert, MD as Referring Physician (Radiation Oncology)  Cancer Staging Malignant neoplasm of lung [facility]) Staging form: Lung, AJCC 7th Edition - Clinical: Stage IIIA (T3, N2, M0) - Signed by Forest Gleason, MD on 05/07/2015    Oncology History   # Squamous cell carcinoma of right LOWER LOBE OF lung stage IIIA based on PET scan Biopsy of the (August of 2016). 2. After 3 cycles of chemotherapy patient underwent resection of lung mass (November, 2016) ypT2B ypN0 [Dr.Oaks] status post right lower lobectomy with a 3. She was started on carboplatinum and Taxol on a weekly basis and radiation therapy from January of 2017 4. After 2 cycles of carboplatinum patient had neuropathy grade 2 interfering with work so chemotherapy was put on hold and radiation was continued (January 19th, 2017) 5. Chemotherapy was discontinued because of progressive neuropathy. Patient is finishing of radiation therapy on November 19, 2015  # NOV 2017- CT- 20m right hilar LN; PET- Feb 2018- NED.   # DEC-JAN 2019- RUL Lung nodule [s/p Bronch; Dr.Kasa- non-diagnostic] SBRT Feb 2019  # AUG 2019- R LE DVT [xarelto]x STOP in end of dec 2019. Syncope- MRI- 13 mm cystic leision/asymptomatic/[facility]; Bil subacute cerebellar stroke;  AUG 2019 - GIB/anemia [EGD/colo- Dr.Toledo; NEG]; Aug 2019-  Severe hypokalemia- sec to Florinef-resolved.   DIAGNOSIS: RLL stage III lung ca; RUL stage I   GOALS: curative  CURRENT/MOST RECENT THERAPY : Surveillaince      Malignant neoplasm of lung (HCC)    Malignant neoplasm of right upper lobe of lung (HCC)      INTERVAL HISTORY:  Claudia Chavez 63y.o.  female pleasant patient above history of stage III lung cancer; and also stage I right upper lobe status post radiation February 2019 is here for follow-up.  In the interim patient a bronchoscopy that showed MAC infection she is currently on ethambutol/antibiotics as per ID.  At some point in time she will start back on Florinef.  Continues she has severe hypokalemia continues to be on potassium pills 3 times a day.  She continues to have cough/sob/sputum.   Review of Systems  Constitutional: Positive for malaise/fatigue. Negative for chills, diaphoresis, fever and weight loss.  HENT: Negative for nosebleeds and sore throat.   Eyes: Negative for double vision.  Respiratory: Positive for cough, sputum production and shortness of breath. Negative for hemoptysis and wheezing.   Cardiovascular: Negative for chest pain, palpitations, orthopnea and leg swelling.  Gastrointestinal: Negative for abdominal pain, blood in stool, constipation, diarrhea, heartburn, melena, nausea and vomiting.  Genitourinary: Negative for dysuria, frequency and urgency.  Musculoskeletal: Negative for back pain and joint pain.  Skin: Negative.  Negative for itching and rash.  Neurological: Negative for tingling, focal weakness, weakness and headaches.  Endo/Heme/Allergies: Does not bruise/bleed easily.  Psychiatric/Behavioral: Negative for depression. The patient is not nervous/anxious and does not have insomnia.       PAST MEDICAL HISTORY :  Past Medical History:  Diagnosis Date  . Cancer of [place] lobe of right lung ([place] 05/07/2015  . COPD [place] obstructive pulmonary disease) ([place]   . Dyspnea   . [place]small cell lung cancer ([place]   . Pneumonia 04/2015  . Pulmonary Mycobacterium avium complex (MAC) infection (  [place]) 10/25/2018    PAST SURGICAL HISTORY :   Past Surgical History:  Procedure Laterality Date  . ABDOMINAL HYSTERECTOMY    . COLONOSCOPY WITH PROPOFOL N/A 06/09/2018   Procedure: COLONOSCOPY  WITH PROPOFOL;  Surgeon: Toledo, Benay Pike, MD;  Location: [facility];  Service: Gastroenterology;  Laterality: N/A;  . ELBOW SURGERY Right 1995  . ELECTROMAGNETIC NAVIGATION BROCHOSCOPY N/A 10/25/2017   Procedure: ELECTROMAGNETIC NAVIGATION BRONCHOSCOPY;  Surgeon: [name], MD;  Location: [facility] OR[facility];  Service: Cardiopulmonary;  Laterality: N/A;  . ESOPHAGOGASTRODUODENOSCOPY N/A 06/08/2018   Procedure: ESOPHAGOGASTRODUODENOSCOPY (EGD);  Surgeon: Toledo, Benay Pike, MD;  Location: [facility] END[facility];  Service: Gastroenterology;  Laterality: N/A;  . FLEXIBLE BRONCHOSCOPY Right 10/10/2018   Procedure: FLEXIBLE BRONCHOSCOPY;  Surgeon: [name], MD;  Location: [facility] OR[facility];  Service: Cardiopulmonary;  Laterality: Right;  . PORTACATH PLACEMENT Right 05/10/2015   Procedure: INSERTION PORT-A-CATH;  Surgeon: Nestor Lewandowsky, MD;  Location: [facility] OR[facility];  Service: General;  Laterality: Right;  Marland Kitchen VIDEO ASSISTED THORACOSCOPY (VATS)/THOROCOTOMY Right 08/18/2015   Procedure: VIDEO ASSISTED THORACOSCOPY (VATS)/THOROCOTOMY;  Surgeon: Nestor Lewandowsky, MD;  Location: [facility];  Service: General;  Laterality: Right;    FAMILY HISTORY :   Family History  Problem Relation Age of Onset  . Stroke Mother   . Lung cancer Father 69    SOCIAL HISTORY:   Social History   Tobacco Use  . Smoking status: Former Smoker    Packs/day: 0.50    Years: 40.00    Pack years: 20.00    Types: Cigarettes    Last attempt to quit: 09/12/2018    Years since quitting: 0.2  . Smokeless tobacco: Never Used  Substance Use Topics  . Alcohol use: Not Currently    Alcohol/week: 2.0 - 4.0 standard drinks    Types: 2 - 4 Cans of beer per week    Comment: beer-occasionally  . Drug use: No    ALLERGIES:  is allergic to keppra [levetiracetam] and lyrica [pregabalin].  MEDICATIONS:  Current Outpatient Medications  Medication Sig Dispense Refill  . albuterol (PROVENTIL HFA;VENTOLIN HFA) 108 (90 Base) MCG/ACT inhaler Inhale 2 puffs into the  lungs every 6 (six) hours as needed for wheezing or shortness of breath.     Marland Kitchen aspirin EC 81 MG tablet Take 81 mg by mouth daily.    Marland Kitchen azithromycin (ZITHROMAX) 500 MG tablet Take 1 tablet (500 mg total) by mouth daily. 30 tablet 1  . benzonatate (TESSALON PERLES) 100 MG capsule Take 1 capsule (100 mg total) by mouth 3 (three) times daily as needed for cough. 90 capsule 0  . Cholecalciferol (VITAMIN D3) 1000 units CAPS Take 2,000 Units by mouth daily.     Marland Kitchen ethambutol (MYAMBUTOL) 400 MG tablet Take 1.5 tablets (600 mg total) by mouth daily. 45 tablet 1  . fludrocortisone (FLORINEF) 0.1 MG tablet Take 0.1 mg by mouth daily.    . Fluticasone-Salmeterol (ADVAIR DISKUS) 250-50 MCG/DOSE AEPB Inhale 1 puff into the lungs every 12 (twelve) hours.     Marland Kitchen guaiFENesin (MUCINEX) 600 MG 12 hr tablet Take 1 tablet (600 mg total) by mouth 2 (two) times daily. 30 tablet 0  . guaiFENesin-codeine 100-10 MG/5ML syrup Take 5 mLs by mouth every 4 (four) hours as needed for cough. 120 mL 0  . ipratropium-albuterol (DUONEB) 0.5-2.5 (3) MG/3ML SOLN TAKE 3 MLS BY NEBULIZATION EVERY 4 (FOUR) HOURS AS NEEDED. (Patient taking differently: Take 3 mLs by nebulization 4 (four) times daily. ) 360 mL 3  . potassium chloride  SA (K-DUR,KLOR-CON) 20 MEQ tablet Take 1.5 tablets (30 mEq total) by mouth daily. 40 tablet 3  . Respiratory Therapy Supplies (FLUTTER) DEVI 1 each by Does not apply route daily. 1 each 0  . rifabutin (MYCOBUTIN) 150 MG capsule Take 2 capsules (300 mg total) by mouth daily. 60 capsule 1  . venlafaxine XR (EFFEXOR-XR) 37.5 MG 24 hr capsule Take 37.5 mg by mouth daily with breakfast.     . vitamin B-12 (CYANOCOBALAMIN) 1000 MCG tablet Take 1,000 mcg by mouth daily.     No current facility-administered medications for this visit.    Facility-Administered Medications Ordered in Other Visits  Medication Dose Route Frequency Provider Last Rate Last Dose  . sodium chloride flush (NS) 0.9 % injection 10 mL  10 mL  Intravenous PRN Cammie Sickle, MD        PHYSICAL EXAMINATION: ECOG PERFORMANCE STATUS: 1 - Symptomatic but completely ambulatory  BP (!) 167/79 (BP Location: Left Arm, Patient Position: Sitting, Cuff Size: Normal)   Pulse 92   Temp 98 F (36.7 C) (Tympanic)   Resp 16   Wt 93 lb 6.4 oz (42.4 kg)   BMI 17.65 kg/m   Filed Weights   12/04/18 1013  Weight: 93 lb 6.4 oz (42.4 kg)    Physical Exam  Constitutional: She is oriented to person, place, and time.  Thin built Caucasian female patient.  She is alone.  Appears to have lost weight.  HENT:  Head: Normocephalic and atraumatic.  Mouth/Throat: Oropharynx is clear and moist. No oropharyngeal exudate.  Eyes: Pupils are equal, round, and reactive to light.  Neck: Normal range of motion. Neck supple.  Cardiovascular: Normal rate and regular rhythm.  Pulmonary/Chest: No respiratory distress. She has no wheezes.  Decreased air entry bilaterally.  Abdominal: Soft. Bowel sounds are normal. She exhibits no distension and no mass. There is no abdominal tenderness. There is no rebound and no guarding.  Musculoskeletal: Normal range of motion.        General: No tenderness or edema.  Neurological: She is alert and oriented to person, place, and time.  Skin: Skin is warm.  Psychiatric: Affect normal.       LABORATORY DATA:  I have reviewed the data as listed    Component Value Date/Time   NA 136 12/04/2018 0933   NA 137 07/30/2018 1245   K 3.9 12/04/2018 0933   CL 101 12/04/2018 0933   CO2 26 12/04/2018 0933   GLUCOSE 98 12/04/2018 0933   BUN 13 12/04/2018 0933   BUN 11 07/30/2018 1245   CREATININE 0.58 12/04/2018 0933   CALCIUM 9.1 12/04/2018 0933   PROT 7.5 11/28/2018 1030   ALBUMIN 3.0 (L) 11/28/2018 1030   AST 14 (L) 11/28/2018 1030   ALT 10 11/28/2018 1030   ALKPHOS 111 11/28/2018 1030   BILITOT 0.4 11/28/2018 1030   GFRNONAA >60 12/04/2018 0933   GFRAA >60 12/04/2018 0933    No results found for: SPEP,  UPEP  Lab Results  Component Value Date   WBC 5.8 12/04/2018   NEUTROABS 4.0 12/04/2018   HGB 8.5 (L) 12/04/2018   HCT 28.9 (L) 12/04/2018   MCV 82.8 12/04/2018   PLT 500 (H) 12/04/2018      Chemistry      Component Value Date/Time   NA 136 12/04/2018 0933   NA 137 07/30/2018 1245   K 3.9 12/04/2018 0933   CL 101 12/04/2018 0933   CO2 26 12/04/2018 0933   BUN 13  12/04/2018 0933   BUN 11 07/30/2018 1245   CREATININE 0.58 12/04/2018 0933      Component Value Date/Time   CALCIUM 9.1 12/04/2018 0933   ALKPHOS 111 11/28/2018 1030   AST 14 (L) 11/28/2018 1030   ALT 10 11/28/2018 1030   BILITOT 0.4 11/28/2018 1030       RADIOGRAPHIC STUDIES: I have personally reviewed the radiological images as listed and agreed with the findings in the report. No results found.   ASSESSMENT & PLAN:  Malignant neoplasm of right upper lobe of lung [facility]) # Right upper lobe stage I lung cancer; s/p feb February 2019 SBRT; PET scan April 01, 2018 also suggestive of radiation changes; less likely recurrence. Stable.   # Left lung stage III lung cancer-stable.   # bil MAC infection- on ethambutol as per Dr.ravishankar; will check with ID re: re-scan.   # Aug 2019 left lower extremity below the knee DVT-- OFF xarelto. On asprin.   # Bilateral cerebellar subacute infarcts-followed by neurology. On asprin.  #Anemia-question etiology [no obvious evidence of GI bleed status post recent EGD colonoscopy]; anemia of chronic disease.  hemoglobin stable 8-9.   # severe hypokalemia sec to Florinef- left message for Dr.Gollan; asked pt to check with cards re: continutaion of florinef.     # DISPOSITION: # follow up in 2 months-labs-cbc/bmp; CT chest prior-Dr.B  Discussed with Dr.Ravishankar. appreciate the recommendations- will get scan in 2 months.   Cc; Hedrick/Gollan/   Orders Placed This Encounter  Procedures  . CT CHEST W CONTRAST    Standing Status:   Future    Standing Expiration Date:    12/04/2019    Order Specific Question:   If indicated for the ordered procedure, I authorize the administration of contrast media per Radiology protocol    Answer:   Yes    Order Specific Question:   Preferred imaging location?    Answer:   [facility]    Order Specific Question:   Radiology Contrast Protocol - do NOT remove file path    Answer:   [email or web address]    Order Specific Question:   ** REASON FOR EXAM (FREE TEXT)    Answer:   lung cancer and MAC infection   All questions were answered. The patient knows to call the clinic with any problems, questions or concerns.      Cammie Sickle, MD 12/04/2018 10:50 AM

## 2018-12-04 NOTE — Assessment & Plan Note (Addendum)
#   Right upper lobe stage I lung cancer; s/p feb February 2019 SBRT; PET scan April 01, 2018 also suggestive of radiation changes; less likely recurrence. Stable.   # Left lung stage III lung cancer-stable.   # bil MAC infection- on ethambutol as per Dr.ravishankar; will check with ID re: re-scan.   # Aug 2019 left lower extremity below the knee DVT-- OFF xarelto. On asprin.   # Bilateral cerebellar subacute infarcts-followed by neurology. On asprin.  #Anemia-question etiology [no obvious evidence of GI bleed status post recent EGD colonoscopy]; anemia of chronic disease.  hemoglobin stable 8-9.   # severe hypokalemia sec to Florinef- left message for Dr.Gollan; asked pt to check with cards re: continutaion of florinef.     # DISPOSITION: # follow up in 2 months-labs-cbc/bmp; CT chest prior-Dr.B  Discussed with Dr.Ravishankar. appreciate the recommendations- will get scan in 2 months.   Cc; Hedrick/Gollan/

## 2018-12-05 ENCOUNTER — Telehealth: Payer: Self-pay

## 2018-12-05 NOTE — Telephone Encounter (Signed)
Dr. B has been speaking with Dr. Rockey Situ and both have agreed that patient can stop taking florinef. Dr. B also advises that patient stop taking potassium after 4 days. I called patient and advised her of these recommendations and she verbalized understanding.

## 2018-12-17 MED FILL — ETHAMBUTOL HCL 400 MG TAB: 400 | 45 days supply | Qty: 45 | Fill #0

## 2018-12-17 MED FILL — AZITHROMYCIN 500 MG TABLET: 500 | 30 days supply | Qty: 30 | Fill #0

## 2018-12-17 MED FILL — RIFABUTIN 150 MG CAPSULE: 150 | 30 days supply | Qty: 60 | Fill #0

## 2019-01-17 MED FILL — RIFABUTIN 150 MG CAPSULE: 150 | 30 days supply | Qty: 60 | Fill #1

## 2019-01-17 MED FILL — AZITHROMYCIN 500 MG TABS: 500 | 30 days supply | Qty: 30 | Fill #1

## 2019-01-20 MED FILL — ETHAMBUTOL HCL 400 MG TAB: 400 | 30 days supply | Qty: 45 | Fill #1

## 2019-02-03 ENCOUNTER — Other Ambulatory Visit: Payer: PPO

## 2019-02-03 ENCOUNTER — Other Ambulatory Visit: Payer: Self-pay

## 2019-02-03 ENCOUNTER — Ambulatory Visit
Admission: RE | Admit: 2019-02-03 | Discharge: 2019-02-03 | Disposition: A | Discharge status: Home / Self Care | Payer: PPO | Source: Ambulatory Visit | Attending: Internal Medicine | Admitting: Internal Medicine

## 2019-02-03 DIAGNOSIS — C7801 Secondary malignant neoplasm of right lung: Secondary | ICD-10-CM | POA: Diagnosis not present

## 2019-02-03 DIAGNOSIS — C7802 Secondary malignant neoplasm of left lung: Secondary | ICD-10-CM | POA: Diagnosis not present

## 2019-02-03 DIAGNOSIS — C3411 Malignant neoplasm of upper lobe, right bronchus or lung: Secondary | ICD-10-CM

## 2019-02-03 DIAGNOSIS — C801 Malignant (primary) neoplasm, unspecified: Secondary | ICD-10-CM | POA: Diagnosis not present

## 2019-02-03 LAB — POCT I-STAT CREATININE: Creatinine, Ser: 0.5 mg/dL (ref 0.44–1.00)

## 2019-02-03 MED ORDER — IOHEXOL 300 MG/ML  SOLN
60.0000 mL | Freq: Once | INTRAMUSCULAR | Status: AC | PRN
  Administered 2019-02-03: 60 mL via INTRAVENOUS

## 2019-02-04 ENCOUNTER — Other Ambulatory Visit: Payer: Self-pay

## 2019-02-05 ENCOUNTER — Other Ambulatory Visit: Payer: Self-pay

## 2019-02-05 ENCOUNTER — Encounter: Payer: Self-pay | Admitting: Internal Medicine

## 2019-02-05 ENCOUNTER — Inpatient Hospital Stay (HOSPITAL_BASED_OUTPATIENT_CLINIC_OR_DEPARTMENT_OTHER): Payer: PPO | Admitting: Internal Medicine

## 2019-02-05 ENCOUNTER — Inpatient Hospital Stay: Payer: PPO | Attending: Internal Medicine

## 2019-02-05 VITALS — BP 146/80 | HR 93 | Temp 98.7°F | Resp 18

## 2019-02-05 DIAGNOSIS — C3411 Malignant neoplasm of upper lobe, right bronchus or lung: Secondary | ICD-10-CM | POA: Insufficient documentation

## 2019-02-05 DIAGNOSIS — E876 Hypokalemia: Secondary | ICD-10-CM

## 2019-02-05 DIAGNOSIS — Z9221 Personal history of antineoplastic chemotherapy: Secondary | ICD-10-CM

## 2019-02-05 DIAGNOSIS — D6489 Other specified anemias: Secondary | ICD-10-CM | POA: Diagnosis not present

## 2019-02-05 LAB — CBC WITH DIFFERENTIAL/PLATELET
Abs Immature Granulocytes: 0.02 10*3/uL (ref 0.00–0.07)
Basophils Absolute: 0.1 10*3/uL (ref 0.0–0.1)
Basophils Relative: 1 %
Eosinophils Absolute: 0.1 10*3/uL (ref 0.0–0.5)
Eosinophils Relative: 2 %
HCT: 31.5 % — ABNORMAL LOW (ref 36.0–46.0)
Hemoglobin: 9.6 g/dL — ABNORMAL LOW (ref 12.0–15.0)
Immature Granulocytes: 0 %
Lymphocytes Relative: 19 %
Lymphs Abs: 1.2 10*3/uL (ref 0.7–4.0)
MCH: 24.5 pg — ABNORMAL LOW (ref 26.0–34.0)
MCHC: 30.5 g/dL (ref 30.0–36.0)
MCV: 80.4 fL (ref 80.0–100.0)
Monocytes Absolute: 0.7 10*3/uL (ref 0.1–1.0)
Monocytes Relative: 11 %
Neutro Abs: 4.3 10*3/uL (ref 1.7–7.7)
Neutrophils Relative %: 67 %
Platelets: 543 10*3/uL — ABNORMAL HIGH (ref 150–400)
RBC: 3.92 MIL/uL (ref 3.87–5.11)
RDW: 16.7 % — ABNORMAL HIGH (ref 11.5–15.5)
WBC: 6.5 10*3/uL (ref 4.0–10.5)
nRBC: 0 % (ref 0.0–0.2)

## 2019-02-05 LAB — COMPREHENSIVE METABOLIC PANEL
ALT: 10 U/L (ref 0–44)
AST: 17 U/L (ref 15–41)
Albumin: 3.5 g/dL (ref 3.5–5.0)
Alkaline Phosphatase: 161 U/L — ABNORMAL HIGH (ref 38–126)
Anion gap: 8 (ref 5–15)
BUN: 9 mg/dL (ref 8–23)
CO2: 26 mmol/L (ref 22–32)
Calcium: 9 mg/dL (ref 8.9–10.3)
Chloride: 98 mmol/L (ref 98–111)
Creatinine, Ser: 0.54 mg/dL (ref 0.44–1.00)
GFR calc Af Amer: >60 mL/min (ref >60)
GFR calc non Af Amer: >60 mL/min (ref >60)
Glucose, Bld: 97 mg/dL (ref 70–99)
Potassium: 4 mmol/L (ref 3.5–5.1)
Sodium: 132 mmol/L — ABNORMAL LOW (ref 135–145)
Total Bilirubin: 0.3 mg/dL (ref 0.3–1.2)
Total Protein: 8.1 g/dL (ref 6.5–8.1)

## 2019-02-05 LAB — LACTATE DEHYDROGENASE: LDH: 108 U/L (ref 98–192)

## 2019-02-05 NOTE — Progress Notes (Signed)
I connected with Claudia Chavez on 02/05/2019 at 10:30 AM EDT by video enabled telemedicine visit and verified that I am speaking with the correct person using two identifiers.  I discussed the limitations, risks, security and privacy concerns of performing an evaluation and management service by telemedicine and the availability of in-person appointments. I also discussed with the patient that there may be a patient responsible charge related to this service. The patient expressed understanding and agreed to proceed.    Other persons participating in the visit and their role in the encounter: None Patient's location: Home Provider's location: Home  Oncology History   # Squamous cell carcinoma of right LOWER LOBE OF lung stage IIIA based on PET scan Biopsy of the (August of 2016). 2. After 3 cycles of chemotherapy patient underwent resection of lung mass (November, 2016) ypT2B ypN0 [Dr.Oaks] status post right lower lobectomy with a 3. She was started on carboplatinum and Taxol on a weekly basis and radiation therapy from January of 2017 4. After 2 cycles of carboplatinum patient had neuropathy grade 2 interfering with work so chemotherapy was put on hold and radiation was continued (January 19th, 2017) 5. Chemotherapy was discontinued because of progressive neuropathy. Patient is finishing of radiation therapy on November 19, 2015  # NOV 2017- CT- 42m right hilar LN; PET- Feb 2018- NED.   # DEC-JAN 2019- RUL Lung nodule [s/p Bronch; Dr.Kasa- non-diagnostic] SBRT Feb 2019  #October 10, 2018 bronchoscopy [Dr.Kasa]-negative for malignancy/positive for MAC infection-ethambutol [Dr.Ravishankar]  # AUG 2019- R LE DVT [xarelto]x STOP in end of dec 2019. Syncope- MRI- 13 mm cystic leision/asymptomatic/[facility]; Bil subacute cerebellar stroke;  AUG 2019 - GIB/anemia [EGD/colo- Dr.Toledo; NEG]; Aug 2019-  Severe hypokalemia- sec to Florinef-resolved.   DIAGNOSIS: RLL stage III lung  ca; RUL stage I   GOALS: curative  CURRENT/MOST RECENT THERAPY : Surveillaince      Malignant neoplasm of lung (HCC)    Malignant ne[place] of right upper lobe of lung ([place]     Chief Complaint: Lung cancer   History of present illness:Claudia Chavez 63 y.o.  female with above history of lung cancer-most recently noted to have atypical/MAC infection on ethambutol by ID.  Patient is here to review recent CT scan.  Patient continues to have mild to moderate shortness of breath on exertion.  Mild to moderate cough.  However not any worse.  No hemoptysis.  No headaches.  Observation/objective: Hemoglobin 9.5.  CT scan see below  Assessment and plan: Malignant neoplasm of right upper lobe of lung ([facility] # Right upper lobe stage I lung cancer; s/p feb February 2019 SBRT; less likely recurrence.  Stable [see discussion below]  # Left lung stage III lung cancer stable [see discussion below]  #Bi-Lateral MAC infection- on ethambutol as per Dr.ravishankar-CT scan April 2020 shows improvement of the right upper lobe and right lower lobe lesions and also improvement of the bilateral lung nodules.  However new subcentimeter nodules noted in the lower lobes-question infectious; malignancy cannot be ruled out.  Will discuss with ID/pulmonary.  We also discussed at tumor conference.  # Bilateral cerebellar subacute infarcts-clinically stable.  Followed by neurology.  On aspirin.  #Anemia-likely secondary to chronic inflammation-hemoglobin clinically stable at 9.3  # severe hypokalemia sec to Florinef-currently off Florinef.  Discussed with Dr. GRockey Situ  Potassium improved at 4.  # DISPOSITION: # Follow up in 2 months- MD- cbc/bmp/CXR prior- Dr.B   Follow-up instructions:  I discussed the assessment and treatment plan  with the patient.  The patient was provided an opportunity to ask questions and all were answered.  The patient agreed with the plan and demonstrated understanding of  instructions.  The patient was advised to call back or seek an in person evaluation if the symptoms worsen or if the condition fails to improve as anticipated.   Dr. Charlaine Dalton [place] at [facility] 02/06/2019 9:57 AM

## 2019-02-05 NOTE — Assessment & Plan Note (Addendum)
#   Right upper lobe stage I lung cancer; s/p feb February 2019 SBRT; less likely recurrence.  Stable [see discussion below]  # Left lung stage III lung cancer stable [see discussion below]  #Bi-Lateral MAC infection- on ethambutol as per Dr.ravishankar-CT scan April 2020 shows improvement of the right upper lobe and right lower lobe lesions and also improvement of the bilateral lung nodules.  However new subcentimeter nodules noted in the lower lobes-question infectious; malignancy cannot be ruled out.  Will discuss with ID/pulmonary.  We also discussed at tumor conference.  # Bilateral cerebellar subacute infarcts-clinically stable.  Followed by neurology.  On aspirin.  #Anemia-likely secondary to chronic inflammation-hemoglobin clinically stable at 9.3  # severe hypokalemia sec to Florinef-currently off Florinef.  Discussed with Dr. Rockey Situ.  Potassium improved at 4.  # DISPOSITION: # Follow up in 2 months- MD- cbc/bmp/CXR prior- Dr.B

## 2019-02-07 ENCOUNTER — Other Ambulatory Visit: Payer: Self-pay | Admitting: Internal Medicine

## 2019-02-07 ENCOUNTER — Telehealth: Payer: Self-pay | Admitting: Licensed Clinical Social Worker

## 2019-02-07 NOTE — Telephone Encounter (Signed)
I called the patient to follow up on her medications and to see if she needed refills. Patient states that she is taking her MAC medications as prescribed and is feeling much better. She states she still has a cough but not as bad. She has a virtual visit next week with Dr. Delaine Lame.

## 2019-02-11 ENCOUNTER — Other Ambulatory Visit: Payer: Self-pay

## 2019-02-11 ENCOUNTER — Ambulatory Visit: Payer: PPO | Attending: Infectious Diseases | Admitting: Infectious Diseases

## 2019-02-11 DIAGNOSIS — C3431 Malignant neoplasm of lower lobe, right bronchus or lung: Secondary | ICD-10-CM | POA: Diagnosis not present

## 2019-02-11 DIAGNOSIS — R911 Solitary pulmonary nodule: Secondary | ICD-10-CM | POA: Diagnosis not present

## 2019-02-11 DIAGNOSIS — A310 Pulmonary mycobacterial infection: Secondary | ICD-10-CM | POA: Diagnosis not present

## 2019-02-11 DIAGNOSIS — J449 Chronic obstructive pulmonary disease, unspecified: Secondary | ICD-10-CM | POA: Diagnosis not present

## 2019-02-11 DIAGNOSIS — Z923 Personal history of irradiation: Secondary | ICD-10-CM

## 2019-02-11 DIAGNOSIS — Z888 Allergy status to other drugs, medicaments and biological substances status: Secondary | ICD-10-CM

## 2019-02-11 DIAGNOSIS — F17211 Nicotine dependence, cigarettes, in remission: Secondary | ICD-10-CM | POA: Diagnosis not present

## 2019-02-11 DIAGNOSIS — Z902 Acquired absence of lung [part of]: Secondary | ICD-10-CM | POA: Diagnosis not present

## 2019-02-11 DIAGNOSIS — Z9221 Personal history of antineoplastic chemotherapy: Secondary | ICD-10-CM | POA: Diagnosis not present

## 2019-02-11 NOTE — Progress Notes (Signed)
NAME: Claudia Chavez  DOB: September 16, 1956  MRN: [id number]  Date/Time: 02/11/2019 1:39 PM Follow up visit for pulmonary MAC   ?The purpose of this virtual visit is to provide medical care while limiting exposure to the novel coronavirus (COVID19) for both patient and office staff.   Consent was obtained for phone visit:  Yes.   Answered questions that patient had about telehealth interaction:  Yes.   I discussed the limitations, risks, security and privacy concerns of performing an evaluation and management service by telephone. I also discussed with the patient that there may be a patient responsible charge related to this service. The patient expressed understanding and agreed to proceed.   Patient Location: Home Provider Location:office   Claudia Chavez is a 63 y.o. with a history of Lung cancer and pulmonary MAC started treatment in Feb 2020 with ethambutol 672m , azithromycin 5027m and rifabutin 30084mnce a day. She is feeling better, has more energy,chest does not feel tight as before, cough better it is more dry now , able to sleep night without coughing,  Still sob on exertion. 100% adherent to the meds No side effects, no diarrhea, no abdominal pain, no diarrhea, no skin rash, no visual issues Had a repeat CT chest done by Dr.Brahmandy   Has a  history of Lung cancer , COPD  Patient was diagnosed non-small cell right lung cancer in July 2016 and underwent 2 cycles of chemotherapy with Taxol and carboplatin  .   She developed  neuropathy.  And chemo was stopped.  She had [facility] lower [facility]  [facility]. OakFaith RogueThis was followed by radiation therapy to the right lung in February 2017 for 30 days.  She was followed by oncology with serial CAT scans and PET scans. PET scan Feb 9th 2018-no evidence of recurrence;postoperative changes/stable right hilar lymph node without any activity noted.  On 05/25/17 CT showed partial cavitary right upper lobe  pulmonary nodule, most consistent with metastatic disease versus metachronous primary. 3. Slight enlargement of a borderline pathologically sized right hilar lymph node. Indeterminate. Cannot exclude nodal metastasis.  CT of the chest repeated on October 25, 2017 Reported as similar size of the right upper lobe pulmonary nodule when comparing to the previous diagnostic CT chest of 05/25/2017, but in the interval, the lesion has become confluent in soft tissue attenuation suggesting interval progression.  She was seen pulmonologist Dr.Casa and underwent bronch and biopsy on 10/25/17.  Pathology report was VERY SCANT LUNG TISSUE WITH PIGMENTED MACROPHAGES. - NO NEOPLASM IS IDENTIFIED IN THIS SPECIMEN She received  SBRT  to the right lung X 5 doses- in Feb 2019  CT of the chest from March 15 2018-showed interval development of a new 6 x 4 mm left upper lobe pulmonary nodule. Metastatic disease a concern. Metachronous primary lesion not excluded. 2. Right upper lobe pulmonary nodule seen on prior studies has become incorporated into an area of ill-defined interstitial and airspace disease in the right upper lung compatible with radiation treatment.    She was complaining of cough and productive sputum and she was treated with multiple courses of antibiotics including Levaquin.  A repeat CT scan was done in  Jan 2020 and it showed Thick-walled, partially cavitary process in the right lung apex measuring 5.4 x 7.4 cm (series 3/image 34), progressive, previously 4.2 x 2.9 cm.Additional thick-walled cavitary process posteriorly in the right upper lobe measuring 8.1 x 5.5 cm (series 3/image 61),  previously 7.7 x 5.1 cm when measured in a similar fashion.      She underwent bronchoscopy and bronchoalveolar lavage was sent for AFB and fungal and bacterial cultures.  The site allergy showed BENIGN ALVEOLATED LUNG PARENCHYMA AND BRONCHIAL WALL WITH FOCAL  ORGANIZING FIBRIN. - NO EVIDENCE OF  TUMOR OR GRANULOMA.    AFB and GMS special stains were negative. The AFB nucleic acid amplification testing was done and that is positive for Mycobacterium avium intracellulare and she was referred to me for treatment.   I Saw her in Jan and then in feb and started her on MAC treatment She is doing better  Past Medical History:  Diagnosis Date  . Cancer of lower lobe of right lung ([place]) 05/07/2015  . COPD (chronic obstructive pulmonary disease) ([place])   . Dyspnea   . Non-small cell lung cancer ([place])   . Pneumonia 04/2015  . Pulmonary Mycobacterium avium complex (MAC) infection ([place]) 10/25/2018    Past Surgical History:  Procedure Laterality Date  . ABDOMINAL HYSTERECTOMY    . COLONOSCOPY WITH PROPOFOL N/A 06/09/2018   Procedure: COLONOSCOPY WITH PROPOFOL;  Surgeon: Toledo, Benay Pike, MD;  Location: [facility] END[facility];  Service: Gastroenterology;  Laterality: N/A;  . ELBOW SURGERY Right 1995  . ELECTROMAGNETIC NAVIGATION BROCHOSCOPY N/A 10/25/2017   Procedure: ELECTROMAGNETIC NAVIGATION BRONCHOSCOPY;  Surgeon: [name], MD;  Location: [facility] ORS;  Service: Cardiopulmonary;  Laterality: N/A;  . ESOPHAGOGASTRODUODENOSCOPY N/A 06/08/2018   Procedure: ESOPHAGOGASTRODUODENOSCOPY (EGD);  Surgeon: Toledo, Benay Pike, MD;  Location: [facility] END[facility]COPY;  Service: Gastroenterology;  Laterality: N/A;  . FLEXIBLE BRONCHOSCOPY Right 10/10/2018   Procedure: FLEXIBLE BRONCHOSCOPY;  Surgeon: [name], MD;  Location: [facility] OR[facility];  Service: Cardiopulmonary;  Laterality: Right;  . PORTACATH PLACEMENT Right 05/10/2015   Procedure: INSERTION PORT-A-CATH;  Surgeon: Nestor Lewandowsky, MD;  Location: [facility] OR[facility];  Service: General;  Laterality: Right;  Marland Kitchen VIDEO ASSISTED THORACOSCOPY (VATS)/THOROCOTOMY Right 08/18/2015   Procedure: VIDEO ASSISTED THORACOSCOPY (VATS)/THOROCOTOMY;  Surgeon: Nestor Lewandowsky, MD;  Location: [facility] OR[facility];  Service: General;  Laterality: Right;    [place] smoker 1-2 /day Lives on her own Family History  Problem  Relation Age of Onset  . Stroke Mother   . Lung cancer Father 63   Allergies  Allergen Reactions  . Keppra [Levetiracetam] Other (See Comments)    Nausea and dizzy  . Lyrica [Pregabalin] Other (See Comments)    Made patient feel very dizzy and not feel good.     ? Current Outpatient Medications  Medication Sig Dispense Refill  . albuterol (PROVENTIL HFA;VENTOLIN HFA) 108 (90 Base) MCG/ACT inhaler Inhale 2 puffs into the lungs every 6 (six) hours as needed for wheezing or shortness of breath.     Marland Kitchen aspirin EC 81 MG tablet Take 81 mg by mouth daily.    Marland Kitchen azithromycin (ZITHROMAX) 500 MG tablet Take 1 tablet (500 mg total) by mouth daily. 30 tablet 1  . benzonatate (TESSALON PERLES) 100 MG capsule Take 1 capsule (100 mg total) by mouth 3 (three) times daily as needed for cough. 90 capsule 0  . Cholecalciferol (VITAMIN D3) 1000 units CAPS Take 2,000 Units by mouth daily.     Marland Kitchen ethambutol (MYAMBUTOL) 400 MG tablet Take 1.5 tablets (600 mg total) by mouth daily. 45 tablet 1  . fludrocortisone (FLORINEF) 0.1 MG tablet Take 0.1 mg by mouth daily.    . Fluticasone-Salmeterol (ADVAIR DISKUS) 250-50 MCG/DOSE AEPB Inhale 1 puff into the lungs every 12 (twelve) hours.     Marland Kitchen  guaiFENesin (MUCINEX) 600 MG 12 hr tablet Take 1 tablet (600 mg total) by mouth 2 (two) times daily. 30 tablet 0  . guaiFENesin-codeine 100-10 MG/5ML syrup Take 5 mLs by mouth every 4 (four) hours as needed for cough. 120 mL 0  . ipratropium-albuterol (DUONEB) 0.5-2.5 (3) MG/3ML SOLN TAKE 3 MLS BY NEBULIZATION EVERY 4 (FOUR) HOURS AS NEEDED. (Patient taking differently: Take 3 mLs by nebulization 4 (four) times daily. ) 360 mL 3  . potassium chloride SA (K-DUR,KLOR-CON) 20 MEQ tablet Take 1.5 tablets (30 mEq total) by mouth daily. 40 tablet 3  . Respiratory Therapy Supplies (FLUTTER) DEVI 1 each by Does not apply route daily. 1 each 0  . rifabutin (MYCOBUTIN) 150 MG capsule Take 2 capsules (300 mg total) by mouth daily. 60 capsule 1   . venlafaxine XR (EFFEXOR-XR) 37.5 MG 24 hr capsule Take 37.5 mg by mouth daily with breakfast.     . vitamin B-12 (CYANOCOBALAMIN) 1000 MCG tablet Take 1,000 mcg by mouth daily.     No current facility-administered medications for this visit.    Facility-Administered Medications Ordered in Other Visits  Medication Dose Route Frequency Provider Last Rate Last Dose  . sodium chloride flush (NS) 0.9 % injection 10 mL  10 mL Intravenous PRN Cammie Sickle, MD          REVIEW OF SYSTEMS:  Const: negative fever, negative chills, negative weight loss Eyes: negative diplopia or visual changes, negative eye pain ENT: negative coryza, negative sore throat Resp:  Cough dry , occasional phlegm which is much better than before, dyspnea persist no hemoptysis,  Cards: negative for chest pain, palpitations, lower extremity edema GU: negative for frequency, dysuria and hematuria GI: Negative for abdominal pain, diarrhea, bleeding, constipation Skin: negative for rash and pruritus Heme: negative for easy bruising and gum/nose bleeding MS: some weakness Neurolo:negative for headaches, dizziness, vertigo, memory problems  Psych: negative for feelings of anxiety, depression  Endocrine: negative for thyroid, diabetes Allergy/Immunology- negative for any medication or food allergies ?  CBC    Component Value Date/Time   WBC 6.5 02/05/2019 1007   RBC 3.92 02/05/2019 1007   HGB 9.6 (L) 02/05/2019 1007   HCT 31.5 (L) 02/05/2019 1007   PLT 543 (H) 02/05/2019 1007   MCV 80.4 02/05/2019 1007   MCH 24.5 (L) 02/05/2019 1007   MCHC 30.5 02/05/2019 1007   RDW 16.7 (H) 02/05/2019 1007   LYMPHSABS 1.2 02/05/2019 1007   MONOABS 0.7 02/05/2019 1007   EOSABS 0.1 02/05/2019 1007   BASOSABS 0.1 02/05/2019 1007    CMP Latest Ref Rng & Units 02/05/2019 02/03/2019 12/04/2018  Glucose 70 - 99 mg/dL 97 - 98  BUN 8 - 23 mg/dL 9 - 13  Creatinine 0.44 - 1.00 mg/dL 0.54 0.50 0.58  Sodium 135 - 145 mmol/L 132(L)  - 136  Potassium 3.5 - 5.1 mmol/L 4.0 - 3.9  Chloride 98 - 111 mmol/L 98 - 101  CO2 22 - 32 mmol/L 26 - 26  Calcium 8.9 - 10.3 mg/dL 9.0 - 9.1  Total Protein 6.5 - 8.1 g/dL 8.1 - -  Total Bilirubin 0.3 - 1.2 mg/dL 0.3 - -  Alkaline Phos 38 - 126 U/L 161(H) - -  AST 15 - 41 U/L 17 - -  ALT 0 - 44 U/L 10 - -      IMAGING RESULTS:  I have personally reviewed the CT chest done on 02/03/19 Decreased size of 2 thick-walled cavitary lesions in right upper  Lobe.?   Impression/Recommendation ?63 y.o.female  with a history of Lung cancer , COPD  ? ? Cavitary lesions in the right lung.  Pulmonary MAC-on treatment with Ethambutol, rifabutin and azithromcyin for the past 6 weeks and doing well. No side effetcs- lfts N, she will need to have a follow up opthal examination. Repeat Ct scan shows improvement Will continue for 9-12 months- This is a sequelae of radiation causing pneumonitis and now cavitation.    Right lung carcinoma.  Status post right lower lobectomy, radiation, recurrence, SBRT.  Followed by Dr. Yevette Edwards and Dr. Mortimer Fries.   COPD on inhalers and nebulizers Smoker: Quit smoking December 2019  H/o Hypokalemia- has resolved- not on any K- florinef was stopped and that likely improved the K? ? ___________________________________________________ Discussed with patient, follow up 3 months

## 2019-02-13 ENCOUNTER — Other Ambulatory Visit: Payer: PPO

## 2019-02-13 DIAGNOSIS — F329 Major depressive disorder, single episode, unspecified: Secondary | ICD-10-CM | POA: Diagnosis not present

## 2019-02-13 DIAGNOSIS — J449 Chronic obstructive pulmonary disease, unspecified: Secondary | ICD-10-CM | POA: Diagnosis not present

## 2019-02-13 DIAGNOSIS — Z0000 Encounter for general adult medical examination without abnormal findings: Secondary | ICD-10-CM | POA: Diagnosis not present

## 2019-02-13 NOTE — Progress Notes (Signed)
Tumor Board Documentation  Claudia Chavez was presented by Dr Rogue Bussing at our Tumor Board on 02/13/2019, which included representatives from medical oncology, radiation oncology, surgical oncology, surgical, radiology, pathology, genetics, internal medicine, navigation, research, palliative care.  Claudia Chavez currently presents as a current patient, for discussion with history of the following treatments: active survellience, neoadjuvant chemoradiation, surgical intervention(s).  Additionally, we reviewed previous medical and familial history, history of present illness, and recent lab results along with all available histopathologic and imaging studies. The tumor board considered available treatment options and made the following recommendations: Active surveillance Re scan in 3 months  The following procedures/referrals were also placed: No orders of the defined types were placed in this encounter.   Clinical Trial Status: not discussed   Staging used: AJCC Stage Group  AJCC Staging: T: ypT2 N: N0   Group: Stage 3 A    National site-specific guidelines NCCN were discussed with respect to the case.  Tumor board is a meeting of clinicians from various specialty areas who evaluate and discuss patients for whom a multidisciplinary approach is being considered. Final determinations in the plan of care are those of the provider(s). The responsibility for follow up of recommendations given during tumor board is that of the provider.   Today's extended care, comprehensive team conference, [name] was not present for the discussion and was not examined.   Multidisciplinary Tumor Board is a multidisciplinary case peer review process.  Decisions discussed in the Multidisciplinary Tumor Board reflect the opinions of the specialists present at the conference without having examined the patient.  Ultimately, treatment and diagnostic decisions rest with the primary provider(s) and the patient.

## 2019-02-14 ENCOUNTER — Other Ambulatory Visit: Payer: Self-pay | Admitting: Infectious Diseases

## 2019-02-14 MED ORDER — RIFABUTIN 150 MG PO CAPS: 300.0000 mg | ORAL_CAPSULE | Freq: Every day | ORAL | 4 refills | Status: DC

## 2019-02-14 MED ORDER — AZITHROMYCIN 500 MG PO TABS: 500.0000 mg | ORAL_TABLET | Freq: Every day | ORAL | 4 refills | Status: DC

## 2019-02-14 MED ORDER — ETHAMBUTOL HCL 400 MG PO TABS: 15.0000 mg/kg | ORAL_TABLET | Freq: Every day | ORAL | 4 refills | Status: DC

## 2019-02-14 MED FILL — RIFABUTIN 150 MG CAPSULE: 150 | 30 days supply | Qty: 60 | Fill #0

## 2019-02-14 MED FILL — AZITHROMYCIN 500 MG TABS: 500 | 30 days supply | Qty: 30 | Fill #0

## 2019-02-14 MED FILL — MYAMBUTOL 400 MG TABLET: 400 | 30 days supply | Qty: 45 | Fill #0

## 2019-04-03 ENCOUNTER — Ambulatory Visit
Admission: RE | Admit: 2019-04-03 | Discharge: 2019-04-03 | Disposition: A | Discharge status: Home / Self Care | Payer: PPO | Source: Ambulatory Visit | Attending: Internal Medicine | Admitting: Internal Medicine

## 2019-04-03 ENCOUNTER — Other Ambulatory Visit: Payer: Self-pay

## 2019-04-03 DIAGNOSIS — R911 Solitary pulmonary nodule: Secondary | ICD-10-CM | POA: Diagnosis not present

## 2019-04-03 DIAGNOSIS — C3411 Malignant neoplasm of upper lobe, right bronchus or lung: Secondary | ICD-10-CM | POA: Diagnosis not present

## 2019-04-07 ENCOUNTER — Inpatient Hospital Stay: Payer: PPO | Attending: Internal Medicine

## 2019-04-07 ENCOUNTER — Other Ambulatory Visit: Payer: Self-pay

## 2019-04-07 ENCOUNTER — Inpatient Hospital Stay (HOSPITAL_BASED_OUTPATIENT_CLINIC_OR_DEPARTMENT_OTHER): Payer: PPO | Admitting: Internal Medicine

## 2019-04-07 DIAGNOSIS — R911 Solitary pulmonary nodule: Secondary | ICD-10-CM

## 2019-04-07 DIAGNOSIS — A310 Pulmonary mycobacterial infection: Secondary | ICD-10-CM | POA: Diagnosis not present

## 2019-04-07 DIAGNOSIS — D649 Anemia, unspecified: Secondary | ICD-10-CM | POA: Diagnosis not present

## 2019-04-07 DIAGNOSIS — C3411 Malignant neoplasm of upper lobe, right bronchus or lung: Secondary | ICD-10-CM

## 2019-04-07 DIAGNOSIS — E871 Hypo-osmolality and hyponatremia: Secondary | ICD-10-CM | POA: Insufficient documentation

## 2019-04-07 DIAGNOSIS — E876 Hypokalemia: Secondary | ICD-10-CM | POA: Diagnosis not present

## 2019-04-07 DIAGNOSIS — G939 Disorder of brain, unspecified: Secondary | ICD-10-CM

## 2019-04-07 DIAGNOSIS — C3492 Malignant neoplasm of unspecified part of left bronchus or lung: Secondary | ICD-10-CM | POA: Insufficient documentation

## 2019-04-07 DIAGNOSIS — R05 Cough: Secondary | ICD-10-CM

## 2019-04-07 DIAGNOSIS — Z87891 Personal history of nicotine dependence: Secondary | ICD-10-CM | POA: Insufficient documentation

## 2019-04-07 LAB — COMPREHENSIVE METABOLIC PANEL
ALT: 11 U/L (ref 0–44)
AST: 19 U/L (ref 15–41)
Albumin: 3.6 g/dL (ref 3.5–5.0)
Alkaline Phosphatase: 152 U/L — ABNORMAL HIGH (ref 38–126)
Anion gap: 11 (ref 5–15)
BUN: 14 mg/dL (ref 8–23)
CO2: 22 mmol/L (ref 22–32)
Calcium: 9.1 mg/dL (ref 8.9–10.3)
Chloride: 95 mmol/L — ABNORMAL LOW (ref 98–111)
Creatinine, Ser: 0.5 mg/dL (ref 0.44–1.00)
GFR calc Af Amer: >60 mL/min (ref >60)
GFR calc non Af Amer: >60 mL/min (ref >60)
Glucose, Bld: 124 mg/dL — ABNORMAL HIGH (ref 70–99)
Potassium: 3.9 mmol/L (ref 3.5–5.1)
Sodium: 128 mmol/L — ABNORMAL LOW (ref 135–145)
Total Bilirubin: 0.4 mg/dL (ref 0.3–1.2)
Total Protein: 7.6 g/dL (ref 6.5–8.1)

## 2019-04-07 LAB — CBC WITH DIFFERENTIAL/PLATELET
Abs Immature Granulocytes: 0.02 10*3/uL (ref 0.00–0.07)
Basophils Absolute: 0.1 10*3/uL (ref 0.0–0.1)
Basophils Relative: 1 %
Eosinophils Absolute: 0.6 10*3/uL — ABNORMAL HIGH (ref 0.0–0.5)
Eosinophils Relative: 8 %
HCT: 31 % — ABNORMAL LOW (ref 36.0–46.0)
Hemoglobin: 9.8 g/dL — ABNORMAL LOW (ref 12.0–15.0)
Immature Granulocytes: 0 %
Lymphocytes Relative: 17 %
Lymphs Abs: 1.2 10*3/uL (ref 0.7–4.0)
MCH: 26 pg (ref 26.0–34.0)
MCHC: 31.6 g/dL (ref 30.0–36.0)
MCV: 82.2 fL (ref 80.0–100.0)
Monocytes Absolute: 0.6 10*3/uL (ref 0.1–1.0)
Monocytes Relative: 8 %
Neutro Abs: 4.6 10*3/uL (ref 1.7–7.7)
Neutrophils Relative %: 66 %
Platelets: 414 10*3/uL — ABNORMAL HIGH (ref 150–400)
RBC: 3.77 MIL/uL — ABNORMAL LOW (ref 3.87–5.11)
RDW: 19.9 % — ABNORMAL HIGH (ref 11.5–15.5)
WBC: 7 10*3/uL (ref 4.0–10.5)
nRBC: 0 % (ref 0.0–0.2)

## 2019-04-07 MED ORDER — BENZONATATE 100 MG PO CAPS: 100.0000 mg | ORAL_CAPSULE | Freq: Three times a day (TID) | ORAL | 3 refills | Status: DC | PRN

## 2019-04-07 NOTE — Progress Notes (Signed)
[place] OFFICE PROGRESS NOTE  Patient Care Team: Maryland Pink, MD as PCP - General (Family Medicine) Cammie Sickle, MD as Medical Oncologist (Medical Oncology) Rockey Situ, Kathlene November, MD as Consulting Physician (Cardiology) Efrain Sella, MD as Consulting Physician (Gastroenterology) Noreene Filbert, MD as Referring Physician (Radiation Oncology)  Cancer Staging Malignant neoplasm of lung [facility]) Staging form: Lung, AJCC 7th Edition - Clinical: Stage IIIA (T3, N2, M0) - Signed by Forest Gleason, MD on 05/07/2015    Oncology History Overview Note  # Squamous cell carcinoma of right LOWER LOBE OF lung stage IIIA based on PET scan Biopsy of the (August of 2016). 2. After 3 cycles of chemotherapy patient underwent resection of lung mass (November, 2016) ypT2B ypN0 [Dr.Oaks] status post right lower lobectomy with a 3. She was started on carboplatinum and Taxol on a weekly basis and radiation therapy from January of 2017 4. After 2 cycles of carboplatinum patient had neuropathy grade 2 interfering with work so chemotherapy was put on hold and radiation was continued (January 19th, 2017) 5. Chemotherapy was discontinued because of progressive neuropathy. Patient is finishing of radiation therapy on November 19, 2015  # NOV 2017- CT- 58m right hilar LN; PET- Feb 2018- NED.   # DEC-JAN 2019- RUL Lung nodule [s/p Bronch; Dr.Kasa- non-diagnostic] SBRT Feb 2019  #October 10, 2018 bronchoscopy [Dr.Kasa]-negative for malignancy/positive for MAC infection-ethambutol [Dr.Ravishankar]  # AUG 2019- R LE DVT [xarelto]x STOP in end of dec 2019. Syncope- MRI- 13 mm cystic leision/asymptomatic/[facility]; Bil subacute cerebellar stroke;  AUG 2019 - GIB/anemia [EGD/colo- Dr.Toledo; NEG]; Aug 2019-  Severe hypokalemia- sec to Florinef-resolved.   DIAGNOSIS: RLL stage III lung ca; RUL stage I   GOALS: curative  CURRENT/MOST RECENT THERAPY : Surveillaince     Malignant neoplasm of lung (HCC)  Malignant neoplasm of right upper lobe of lung (HCC)      INTERVAL HISTORY:  Claudia GUALLPA89y.o.  female pleasant patient above history of stage III lung cancer; and also stage I right upper lobe status post radiation February 2019 is here for follow-up.  In the interim patient a bronchoscopy that showed MAC infection she is currently on ethambutol/antibiotics as per ID.  At some point in time she will start back on Florinef.  Continues she has severe hypokalemia continues to be on potassium pills 3 times a day.  She continues to have cough/sob/sputum.   Review of Systems  Constitutional: Positive for malaise/fatigue. Negative for chills, diaphoresis, fever and weight loss.  HENT: Negative for nosebleeds and sore throat.   Eyes: Negative for double vision.  Respiratory: Positive for cough, sputum production and shortness of breath. Negative for hemoptysis and wheezing.   Cardiovascular: Negative for chest pain, palpitations, orthopnea and leg swelling.  Gastrointestinal: Negative for abdominal pain, blood in stool, constipation, diarrhea, heartburn, melena, nausea and vomiting.  Genitourinary: Negative for dysuria, frequency and urgency.  Musculoskeletal: Negative for back pain and joint pain.  Skin: Negative.  Negative for itching and rash.  Neurological: Negative for tingling, focal weakness, weakness and headaches.  Endo/Heme/Allergies: Does not bruise/bleed easily.  Psychiatric/Behavioral: Negative for depression. The patient is not nervous/anxious and does not have insomnia.       PAST MEDICAL HISTORY :  Past Medical History:  Diagnosis Date  . Cancer of [place] lobe of right lung ([place] 05/07/2015  . COPD (chronic obstructive pulmonary disease) ([facility]   . Dyspnea   . [place]small cell lung cancer ([place]   . Pneumonia  04/2015  . Pulmonary Mycobacterium avium complex (MAC) infection ([place]) 10/25/2018    PAST SURGICAL HISTORY :   Past Surgical  History:  Procedure Laterality Date  . ABDOMINAL HYSTERECTOMY    . COLONOSCOPY WITH PROPOFOL N/A 06/09/2018   Procedure: COLONOSCOPY WITH PROPOFOL;  Surgeon: Toledo, Benay Pike, MD;  Location: [facility] ENDOSCOPY;  Service: Gastroenterology;  Laterality: N/A;  . ELBOW SURGERY Right 1995  . ELECTROMAGNETIC NAVIGATION BROCHOSCOPY N/A 10/25/2017   Procedure: ELECTROMAGNETIC NAVIGATION BRONCHOSCOPY;  Surgeon: [name], MD;  Location: [facility] ORS;  Service: Cardiopulmonary;  Laterality: N/A;  . ESOPHAGOGASTRODUODENOSCOPY N/A 06/08/2018   Procedure: ESOPHAGOGASTRODUODENOSCOPY (EGD);  Surgeon: Toledo, Benay Pike, MD;  Location: [facility] ENDOSCOPY;  Service: Gastroenterology;  Laterality: N/A;  . FLEXIBLE BRONCHOSCOPY Right 10/10/2018   Procedure: FLEXIBLE BRONCHOSCOPY;  Surgeon: [name], MD;  Location: [facility] ORS;  Service: Cardiopulmonary;  Laterality: Right;  . PORTACATH PLACEMENT Right 05/10/2015   Procedure: INSERTION PORT-A-CATH;  Surgeon: Nestor Lewandowsky, MD;  Location: [facility] ORS;  Service: General;  Laterality: Right;  Marland Kitchen VIDEO ASSISTED THORACOSCOPY (VATS)/THOROCOTOMY Right 08/18/2015   Procedure: VIDEO ASSISTED THORACOSCOPY (VATS)/THOROCOTOMY;  Surgeon: Nestor Lewandowsky, MD;  Location: [facility] ORS;  Service: General;  Laterality: Right;    FAMILY HISTORY :   Family History  Problem Relation Age of Onset  . Stroke Mother   . Lung cancer Father 40    SOCIAL HISTORY:   Social History   Tobacco Use  . Smoking status: Former Smoker    Packs/day: 0.50    Years: 40.00    Pack years: 20.00    Types: Cigarettes    Quit date: 09/12/2018    Years since quitting: 0.5  . Smokeless tobacco: Never Used  Substance Use Topics  . Alcohol use: Not Currently    Alcohol/week: 2.0 - 4.0 standard drinks    Types: 2 - 4 Cans of beer per week    Comment: beer-occasionally  . Drug use: No    ALLERGIES:  is allergic to keppra [levetiracetam] and lyrica [pregabalin].  MEDICATIONS:  Current Outpatient Medications   Medication Sig Dispense Refill  . albuterol (PROVENTIL HFA;VENTOLIN HFA) 108 (90 Base) MCG/ACT inhaler Inhale 2 puffs into the lungs every 6 (six) hours as needed for wheezing or shortness of breath.     Marland Kitchen aspirin EC 81 MG tablet Take 81 mg by mouth daily.    Marland Kitchen azithromycin (ZITHROMAX) 500 MG tablet Take 1 tablet (500 mg total) by mouth daily. 30 tablet 4  . Cholecalciferol (VITAMIN D3) 1000 units CAPS Take 2,000 Units by mouth daily.     Marland Kitchen ethambutol (MYAMBUTOL) 400 MG tablet Take 1.5 tablets (600 mg total) by mouth daily. 45 tablet 4  . Fluticasone-Salmeterol (ADVAIR DISKUS) 250-50 MCG/DOSE AEPB Inhale 1 puff into the lungs every 12 (twelve) hours.     Marland Kitchen guaiFENesin-codeine 100-10 MG/5ML syrup Take 5 mLs by mouth every 4 (four) hours as needed for cough. 120 mL 0  . ipratropium-albuterol (DUONEB) 0.5-2.5 (3) MG/3ML SOLN TAKE 3 MLS BY NEBULIZATION EVERY 4 (FOUR) HOURS AS NEEDED. (Patient taking differently: Take 3 mLs by nebulization 4 (four) times daily. ) 360 mL 3  . potassium chloride SA (K-DUR,KLOR-CON) 20 MEQ tablet Take 1.5 tablets (30 mEq total) by mouth daily. 40 tablet 3  . Respiratory Therapy Supplies (FLUTTER) DEVI 1 each by Does not apply route daily. 1 each 0  . rifabutin (MYCOBUTIN) 150 MG capsule Take 2 capsules (300 mg total) by mouth daily. 60 capsule 4  . venlafaxine XR (EFFEXOR-XR)  37.5 MG 24 hr capsule Take 37.5 mg by mouth daily with breakfast.     . vitamin B-12 (CYANOCOBALAMIN) 1000 MCG tablet Take 1,000 mcg by mouth daily.    . benzonatate (TESSALON PERLES) 100 MG capsule Take 1 capsule (100 mg total) by mouth 3 (three) times daily as needed for cough. 90 capsule 3  . guaiFENesin (MUCINEX) 600 MG 12 hr tablet Take 1 tablet (600 mg total) by mouth 2 (two) times daily. (Patient not taking: Reported on 04/07/2019) 30 tablet 0   No current facility-administered medications for this visit.    Facility-Administered Medications Ordered in Other Visits  Medication Dose Route  Frequency Provider Last Rate Last Dose  . sodium chloride flush (NS) 0.9 % injection 10 mL  10 mL Intravenous PRN Cammie Sickle, MD        PHYSICAL EXAMINATION: ECOG PERFORMANCE STATUS: 1 - Symptomatic but completely ambulatory  BP (!) 166/88   Pulse 91   Temp (!) 97 F (36.1 C) (Tympanic)   Resp 18   Ht _0  (1.549 m)   Wt 96 lb 14.4 oz (44 kg)   BMI 18.31 kg/m   Filed Weights   04/07/19 1017  Weight: 96 lb 14.4 oz (44 kg)    Physical Exam  Constitutional: She is oriented to person, place, and time.  Thin built Caucasian female patient.  She is alone.  Appears to have lost weight.  HENT:  Head: Normocephalic and atraumatic.  Mouth/Throat: Oropharynx is clear and moist. No oropharyngeal exudate.  Eyes: Pupils are equal, round, and reactive to light.  Neck: Normal range of motion. Neck supple.  Cardiovascular: Normal rate and regular rhythm.  Pulmonary/Chest: No respiratory distress. She has no wheezes.  Decreased air entry bilaterally.  Abdominal: Soft. Bowel sounds are normal. She exhibits no distension and no mass. There is no abdominal tenderness. There is no rebound and no guarding.  Musculoskeletal: Normal range of motion.        General: No tenderness or edema.  Neurological: She is alert and oriented to person, place, and time.  Skin: Skin is warm.  Psychiatric: Affect normal.       LABORATORY DATA:  I have reviewed the data as listed    Component Value Date/Time   NA 128 (L) 04/07/2019 0957   NA 137 07/30/2018 1245   K 3.9 04/07/2019 0957   CL 95 (L) 04/07/2019 0957   CO2 22 04/07/2019 0957   GLUCOSE 124 (H) 04/07/2019 0957   BUN 14 04/07/2019 0957   BUN 11 07/30/2018 1245   CREATININE 0.50 04/07/2019 0957   CALCIUM 9.1 04/07/2019 0957   PROT 7.6 04/07/2019 0957   ALBUMIN 3.6 04/07/2019 0957   AST 19 04/07/2019 0957   ALT 11 04/07/2019 0957   ALKPHOS 152 (H) 04/07/2019 0957   BILITOT 0.4 04/07/2019 0957   GFRNONAA >60 04/07/2019 0957    GFRAA >60 04/07/2019 0957    No results found for: SPEP, UPEP  Lab Results  Component Value Date   WBC 7.0 04/07/2019   NEUTROABS 4.6 04/07/2019   HGB 9.8 (L) 04/07/2019   HCT 31.0 (L) 04/07/2019   MCV 82.2 04/07/2019   PLT 414 (H) 04/07/2019      Chemistry      Component Value Date/Time   NA 128 (L) 04/07/2019 0957   NA 137 07/30/2018 1245   K 3.9 04/07/2019 0957   CL 95 (L) 04/07/2019 0957   CO2 22 04/07/2019 0957   BUN 14  04/07/2019 0957   BUN 11 07/30/2018 1245   CREATININE 0.50 04/07/2019 0957      Component Value Date/Time   CALCIUM 9.1 04/07/2019 0957   ALKPHOS 152 (H) 04/07/2019 0957   AST 19 04/07/2019 0957   ALT 11 04/07/2019 0957   BILITOT 0.4 04/07/2019 0957       RADIOGRAPHIC STUDIES: I have personally reviewed the radiological images as listed and agreed with the findings in the report. No results found.   ASSESSMENT & PLAN:  Malignant neoplasm of right upper lobe of lung ([place]) # Right upper lobe stage I lung cancer; s/p feb February 2019 SBRT; less likely recurrence.  Clinically stable.  # Left lung stage III lung cancer-clinically stable./See below  #Bi-Lateral MAC infection-on ethambutol at least until Nov 2020 [~9-12 months as per Dr.Ravishankar]. CT scan April 2020 shows improvement of the right upper lobe and right lower lobe lesions and also improvement of the bilateral lung nodules; new subcentimeter bilateral nodules-likely inflammatory.  July 2020 chest x-ray-stable.  No progression of disease noted.  Discussed that we will plan to get a CT scan in 2 months.  # Bilateral cerebellar subacute infarcts-clinically stable.  Followed by neurology.  Stable.   #Anemia-likely secondary to chronic inflammation-hemoglobin clinically stable at 9.8  # mild hyponatremia-question etiology 128; increased salt in take.   # DISPOSITION: # Follow up in 2 months- MD- cbc/Cmp/CT chest/port flush- Dr.B    Orders Placed This Encounter  Procedures  . CT  CHEST W CONTRAST    Standing Status:   Future    Standing Expiration Date:   04/06/2020    Order Specific Question:   If indicated for the ordered procedure, I authorize the administration of contrast media per Radiology protocol    Answer:   Yes    Order Specific Question:   Preferred imaging location?    Answer:   [facility]    Order Specific Question:   Radiology Contrast Protocol - do NOT remove file path    Answer:   [email or web address]    Order Specific Question:   ** REASON FOR EXAM (FREE TEXT)    Answer:   lung cancer/ MAC infection   All questions were answered. The patient knows to call the clinic with any problems, questions or concerns.      Cammie Sickle, MD 04/07/2019 8:34 PM

## 2019-04-07 NOTE — Assessment & Plan Note (Addendum)
#   Right upper lobe stage I lung cancer; s/p feb February 2019 SBRT; less likely recurrence.  Clinically stable.  # Left lung stage III lung cancer-clinically stable./See below  #Bi-Lateral MAC infection-on ethambutol at least until Nov 2020 [~9-12 months as per Dr.Ravishankar]. CT scan April 2020 shows improvement of the right upper lobe and right lower lobe lesions and also improvement of the bilateral lung nodules; new subcentimeter bilateral nodules-likely inflammatory.  July 2020 chest x-ray-stable.  No progression of disease noted.  Discussed that we will plan to get a CT scan in 2 months.  # Bilateral cerebellar subacute infarcts-clinically stable.  Followed by neurology.  Stable.   #Anemia-likely secondary to chronic inflammation-hemoglobin clinically stable at 9.8  # mild hyponatremia-question etiology 128; increased salt in take.   # DISPOSITION: # Follow up in 2 months- MD- cbc/Cmp/CT chest/port flush- Dr.B

## 2019-04-08 MED FILL — AZITHROMYCIN 500 MG TABS: 500 | 30 days supply | Qty: 30 | Fill #1

## 2019-04-08 MED FILL — MYAMBUTOL 400 MG TABLET: 400 | 30 days supply | Qty: 45 | Fill #1

## 2019-04-08 MED FILL — RIFABUTIN 150 MG CAPSULE: 150 | 30 days supply | Qty: 60 | Fill #1

## 2019-04-24 ENCOUNTER — Other Ambulatory Visit: Payer: Self-pay

## 2019-04-25 ENCOUNTER — Ambulatory Visit
Admission: RE | Admit: 2019-04-25 | Discharge: 2019-04-25 | Disposition: A | Discharge status: Home / Self Care | Payer: PPO | Source: Ambulatory Visit | Attending: Radiation Oncology | Admitting: Radiation Oncology

## 2019-04-25 ENCOUNTER — Other Ambulatory Visit: Payer: Self-pay

## 2019-04-25 ENCOUNTER — Encounter: Payer: Self-pay | Admitting: Radiation Oncology

## 2019-04-25 VITALS — BP 146/84 | HR 90 | Temp 97.1°F | Resp 20 | Wt 95.8 lb

## 2019-04-25 DIAGNOSIS — Z923 Personal history of irradiation: Secondary | ICD-10-CM | POA: Insufficient documentation

## 2019-04-25 DIAGNOSIS — C7801 Secondary malignant neoplasm of right lung: Secondary | ICD-10-CM | POA: Insufficient documentation

## 2019-04-25 DIAGNOSIS — C3431 Malignant neoplasm of lower lobe, right bronchus or lung: Secondary | ICD-10-CM | POA: Diagnosis not present

## 2019-04-25 DIAGNOSIS — R918 Other nonspecific abnormal finding of lung field: Secondary | ICD-10-CM | POA: Insufficient documentation

## 2019-04-25 DIAGNOSIS — F17210 Nicotine dependence, cigarettes, uncomplicated: Secondary | ICD-10-CM | POA: Insufficient documentation

## 2019-04-25 NOTE — Progress Notes (Signed)
Radiation Oncology Follow up Note  Name: Claudia Chavez   Date:   04/25/2019 MRN:  [id number] DOB: [birth year]    This 63 y.o. female presents to the clinic today for 1-1/2-year follow-up.  For stage IIIa carcinoma of the right lower lobe.  Patient is also had SBRT to her right upper lobe lung nodule back in January 2019  REFERRING PROVIDER: Maryland Pink, MD  HPI: Patient is a 63 year old female status post concurrent chemoradiation therapy for stage IIIa carcinoma the right lower lobe.  She also had SBRT treatment to her right upper lobe back in February 2019.  She is seen today in routine follow-up.Marland Kitchen  Her last CT scan was back in May 2020 showing pre-existing pulmonary lesions showing mild improvement.  There is also numerous subcentimeter pulmonary nodules located localized in the left lower lobe metastatic disease cannot be ruled out.  She has a follow-up CT scan in August.  On my review both of these areas are stable including the left lung as well as a right upper lobe lesion.  Based on the improvement without treatment these were probably inflammatory.  COMPLICATIONS OF TREATMENT: none  FOLLOW UP COMPLIANCE: keeps appointments   PHYSICAL EXAM:  BP (!) 146/84   Pulse 90   Temp (!) 97.1 F (36.2 C)   Resp 20   Wt 95 lb 12.6 oz (43.5 kg)   BMI 18.10 kg/m  Well-developed well-nourished patient in NAD. HEENT reveals PERLA, EOMI, discs not visualized.  Oral cavity is clear. No oral mucosal lesions are identified. Neck is clear without evidence of cervical or supraclavicular adenopathy. Lungs are clear to A&P. Cardiac examination is essentially unremarkable with regular rate and rhythm without murmur rub or thrill. Abdomen is benign with no organomegaly or masses noted. Motor sensory and DTR levels are equal and symmetric in the upper and lower extremities. Cranial nerves II through XII are grossly intact. Proprioception is intact. No peripheral adenopathy or edema is identified. No motor  or sensory levels are noted. Crude visual fields are within normal range.  RADIOLOGY RESULTS: CT scan reviewed compatible with above-stated findings  PLAN: Present time patient is doing well clinically stable.  She will have a follow-up CT scan in August and I will review that when it becomes available.  She continues close follow-up care with medical oncology.  I have asked to see her out in 6 months for follow-up.  I be happy to reevaluate the patient anytime should further treatment be indicated.  I would like to take this opportunity to thank you for allowing me to participate in the care of your patient.Noreene Filbert, MD

## 2019-06-02 ENCOUNTER — Ambulatory Visit
Admission: RE | Admit: 2019-06-02 | Discharge: 2019-06-02 | Disposition: A | Discharge status: Home / Self Care | Payer: PPO | Source: Ambulatory Visit | Attending: Internal Medicine | Admitting: Internal Medicine

## 2019-06-02 ENCOUNTER — Other Ambulatory Visit: Payer: Self-pay

## 2019-06-02 DIAGNOSIS — R918 Other nonspecific abnormal finding of lung field: Secondary | ICD-10-CM | POA: Diagnosis not present

## 2019-06-02 DIAGNOSIS — C3411 Malignant neoplasm of upper lobe, right bronchus or lung: Secondary | ICD-10-CM | POA: Diagnosis not present

## 2019-06-02 LAB — POCT I-STAT CREATININE: Creatinine, Ser: 0.6 mg/dL (ref 0.44–1.00)

## 2019-06-02 MED ORDER — IOHEXOL 300 MG/ML  SOLN
75.0000 mL | Freq: Once | INTRAMUSCULAR | Status: AC | PRN
  Administered 2019-06-02: 75 mL via INTRAVENOUS

## 2019-06-06 ENCOUNTER — Other Ambulatory Visit: Payer: Self-pay

## 2019-06-06 DIAGNOSIS — C3411 Malignant neoplasm of upper lobe, right bronchus or lung: Secondary | ICD-10-CM

## 2019-06-10 ENCOUNTER — Inpatient Hospital Stay (HOSPITAL_BASED_OUTPATIENT_CLINIC_OR_DEPARTMENT_OTHER): Payer: PPO | Admitting: Internal Medicine

## 2019-06-10 ENCOUNTER — Encounter: Payer: Self-pay | Admitting: Internal Medicine

## 2019-06-10 ENCOUNTER — Other Ambulatory Visit: Payer: Self-pay

## 2019-06-10 ENCOUNTER — Inpatient Hospital Stay: Payer: PPO | Attending: Internal Medicine

## 2019-06-10 DIAGNOSIS — C3411 Malignant neoplasm of upper lobe, right bronchus or lung: Secondary | ICD-10-CM

## 2019-06-10 DIAGNOSIS — R05 Cough: Secondary | ICD-10-CM | POA: Insufficient documentation

## 2019-06-10 DIAGNOSIS — Z87891 Personal history of nicotine dependence: Secondary | ICD-10-CM | POA: Insufficient documentation

## 2019-06-10 DIAGNOSIS — D649 Anemia, unspecified: Secondary | ICD-10-CM | POA: Insufficient documentation

## 2019-06-10 DIAGNOSIS — R0609 Other forms of dyspnea: Secondary | ICD-10-CM | POA: Insufficient documentation

## 2019-06-10 DIAGNOSIS — C3492 Malignant neoplasm of unspecified part of left bronchus or lung: Secondary | ICD-10-CM | POA: Diagnosis not present

## 2019-06-10 DIAGNOSIS — R635 Abnormal weight gain: Secondary | ICD-10-CM | POA: Insufficient documentation

## 2019-06-10 DIAGNOSIS — E871 Hypo-osmolality and hyponatremia: Secondary | ICD-10-CM | POA: Diagnosis not present

## 2019-06-10 LAB — CBC WITH DIFFERENTIAL/PLATELET
Abs Immature Granulocytes: 0.02 10*3/uL (ref 0.00–0.07)
Basophils Absolute: 0.1 10*3/uL (ref 0.0–0.1)
Basophils Relative: 1 %
Eosinophils Absolute: 0.2 10*3/uL (ref 0.0–0.5)
Eosinophils Relative: 3 %
HCT: 35.4 % — ABNORMAL LOW (ref 36.0–46.0)
Hemoglobin: 11.3 g/dL — ABNORMAL LOW (ref 12.0–15.0)
Immature Granulocytes: 0 %
Lymphocytes Relative: 19 %
Lymphs Abs: 1.5 10*3/uL (ref 0.7–4.0)
MCH: 28 pg (ref 26.0–34.0)
MCHC: 31.9 g/dL (ref 30.0–36.0)
MCV: 87.8 fL (ref 80.0–100.0)
Monocytes Absolute: 0.7 10*3/uL (ref 0.1–1.0)
Monocytes Relative: 9 %
Neutro Abs: 5.3 10*3/uL (ref 1.7–7.7)
Neutrophils Relative %: 68 %
Platelets: 426 10*3/uL — ABNORMAL HIGH (ref 150–400)
RBC: 4.03 MIL/uL (ref 3.87–5.11)
RDW: 17.4 % — ABNORMAL HIGH (ref 11.5–15.5)
WBC: 7.8 10*3/uL (ref 4.0–10.5)
nRBC: 0 % (ref 0.0–0.2)

## 2019-06-10 LAB — COMPREHENSIVE METABOLIC PANEL
ALT: 15 U/L (ref 0–44)
AST: 21 U/L (ref 15–41)
Albumin: 3.9 g/dL (ref 3.5–5.0)
Alkaline Phosphatase: 170 U/L — ABNORMAL HIGH (ref 38–126)
Anion gap: 10 (ref 5–15)
BUN: 14 mg/dL (ref 8–23)
CO2: 23 mmol/L (ref 22–32)
Calcium: 9.3 mg/dL (ref 8.9–10.3)
Chloride: 100 mmol/L (ref 98–111)
Creatinine, Ser: 0.58 mg/dL (ref 0.44–1.00)
GFR calc Af Amer: >60 mL/min (ref >60)
GFR calc non Af Amer: >60 mL/min (ref >60)
Glucose, Bld: 124 mg/dL — ABNORMAL HIGH (ref 70–99)
Potassium: 3.7 mmol/L (ref 3.5–5.1)
Sodium: 133 mmol/L — ABNORMAL LOW (ref 135–145)
Total Bilirubin: 0.4 mg/dL (ref 0.3–1.2)
Total Protein: 7.9 g/dL (ref 6.5–8.1)

## 2019-06-10 MED ORDER — SODIUM CHLORIDE 0.9% FLUSH
10.0000 mL | Freq: Once | INTRAVENOUS | Status: AC
  Administered 2019-06-10: 10 mL via INTRAVENOUS
  Filled 2019-06-10: qty 10

## 2019-06-10 MED ORDER — HEPARIN SOD (PORK) LOCK FLUSH 100 UNIT/ML IV SOLN
500.0000 [IU] | Freq: Once | INTRAVENOUS | Status: AC
  Administered 2019-06-10: 10:00:00 500 [IU] via INTRAVENOUS

## 2019-06-10 NOTE — Progress Notes (Signed)
[facility] OFFICE PROGRESS NOTE  Patient Care Team: [place] Pink, MD as PCP - General (Family Medicine) Cammie Sickle, MD as Medical Oncologist (Medical Oncology) Rockey Situ, Kathlene November, MD as Consulting Physician (Cardiology) Efrain Sella, MD as Consulting Physician (Gastroenterology) Claudia Filbert, MD as Referring Physician (Radiation Oncology)  Cancer Staging Malignant neoplasm of lung [facility]) Staging form: Lung, AJCC 7th Edition - Clinical: Stage IIIA (T3, N2, M0) - Signed by Forest Gleason, MD on 05/07/2015    Oncology History Overview Note  # Squamous cell carcinoma of right LOWER LOBE OF lung stage IIIA based on PET scan Biopsy of the (August of 2016). 2. After 3 cycles of chemotherapy patient underwent resection of lung mass (November, 2016) ypT2B ypN0 [[facility]audia [facility]avez] status post right lower lobectomy with a 3. She was started on carboplatinum and Taxol on a weekly basis and radiation therapy from January of 2017 4. After 2 cycles of carboplatinum patient had neuropathy grade 2 interfering with work so chemotherapy was put on hold and radiation was continued (January 19th, 2017) 5. Chemotherapy was discontinued because of progressive neuropathy. Patient is finishing of radiation therapy on November 19, 2015  # NOV 2017- CT- 25m right hilar LN; PET- Feb 2018- NED.   # DEC-JAN 2019- RUL Lung nodule [s/p Bronch; Claudia Chavez- non-diagnostic] SBRT Feb 2019  #October 10, 2018 bronchoscopy [Claudia Chavez]-negative for malignancy/positive for MAC infection-ethambutol [Claudia Chavez]  # AUG 2019- R LE DVT [xarelto]x STOP in end of dec 2019. Syncope- MRI- 13 mm cystic leision/asymptomatic/[facility]; Bil subacute cerebellar stroke;  AUG 2019 - GIB/anemia [EGD/colo- Claudia Chavez; NEG]; Aug 2019-  Severe hypokalemia- sec to Florinef-resolved.   DIAGNOSIS: RLL stage III lung ca; RUL stage I   GOALS: curative  CURRENT/MOST RECENT THERAPY : Surveillaince     Malignant neoplasm of lung (HCC)  Malignant neoplasm of right upper lobe of lung (HCC)      INTERVAL HISTORY:  Claudia [id number].[id number].  female pleasant patient above history of stage III lung cancer; and also stage I right upper lobe status post radiation February 2019 is here for follow-up/review the results the CT scan.  Patient continues treatment with ethambutol/antibiotics for MAC infection.  Planning until November 2020.  Patient denies any dizzy spells.  Denies any falls.  Appetite is good.  Gaining weight.    Chronic mild cough.  Mild sputum production mild shortness of breath on exertion.  Otherwise no fevers.  Review of Systems  Constitutional: Positive for malaise/fatigue. Negative for chills, diaphoresis, fever and weight loss.  HENT: Negative for nosebleeds and sore throat.   Eyes: Negative for double vision.  Respiratory: Positive for cough, sputum production and shortness of breath. Negative for hemoptysis and wheezing.   Cardiovascular: Negative for chest pain, palpitations, orthopnea and leg swelling.  Gastrointestinal: Negative for abdominal pain, blood in stool, constipation, diarrhea, heartburn, melena, nausea and vomiting.  Genitourinary: Negative for dysuria, frequency and urgency.  Musculoskeletal: Negative for back pain and joint pain.  Skin: Negative.  Negative for itching and rash.  Neurological: Negative for tingling, focal weakness, weakness and headaches.  Endo/Heme/Allergies: Does not bruise/bleed easily.  Psychiatric/Behavioral: Negative for depression. The patient is not nervous/anxious and does not have insomnia.       PAST MEDICAL HISTORY :  Past Medical History:  Diagnosis Date  . Cancer of [place] of right lung ([place] 05/07[place]  . COPD [place]ch[place] obstructive pulmonary disease) ([place]   . Dyspnea   . [place] cell lung cancer ([place]   .  Pneumonia 04/2015  . Pulmonary Mycobacterium avium complex (MAC) infection ([place]) 10/25/2018    PAST  SURGICAL HISTORY :   Past Surgical History:  Procedure Laterality Date  . ABDOMINAL HYSTERECTOMY    . COLONOSCOPY WITH PROPOFOL N/A 06/09/2018   Procedure: COLONOSCOPY WITH PROPOFOL;  Surgeon: Toledo, Benay Pike, MD;  Location: [facility] END[facility]COPY;  Service: Gastroenterology;  Laterality: N/A;  . ELBOW SURGERY Right 1995  . ELECTROMAGNETIC NAVIGATION BROCHOSCOPY N/A 10/25/2017   Procedure: ELECTROMAGNETIC NAVIGATION BRONCHOSCOPY;  Surgeon: [name], MD;  Location: [facility] ORS;  Service: Cardiopulmonary;  Laterality: N/A;  . ESOPHAGOGASTRODUODENOSCOPY N/A 06/08/2018   Procedure: ESOPHAGOGASTRODUODENOSCOPY (EGD);  Surgeon: Toledo, Benay Pike, MD;  Location: [facility] ENDOSCOPY;  Service: Gastroenterology;  Laterality: N/A;  . FLEXIBLE BRONCHOSCOPY Right 10/10/2018   Procedure: FLEXIBLE BRONCHOSCOPY;  Surgeon: [name], MD;  Location: [facility] ORS;  Service: Cardiopulmonary;  Laterality: Right;  . PORTACATH PLACEMENT Right 05/10/2015   Procedure: INSERTION PORT-A-CATH;  Surgeon: Nestor Lewandowsky, MD;  Location: [facility] ORS;  Service: General;  Laterality: Right;  Marland Kitchen VIDEO ASSISTED THORACOSCOPY (VATS)/THOROCOTOMY Right 08/18/2015   Procedure: VIDEO ASSISTED THORACOSCOPY (VATS)/THOROCOTOMY;  Surgeon: Nestor Lewandowsky, MD;  Location: [facility] OR[facility];  Service: General;  Laterality: Right;    FAMILY HISTORY :   Family History  Problem Relation Age of Onset  . Stroke Mother   . Lung cancer Father 64    SOCIAL HISTORY:   Social History   Tobacco Use  . Smoking status: Former Smoker    Packs/day: 0.50    Years: 40.00    Pack years: 20.00    Types: Cigarettes    Quit date: 09/12/2018    Years since quitting: 0.7  . Smokeless tobacco: Never Used  Substance Use Topics  . Alcohol use: Not Currently    Alcohol/week: 2.0 - 4.0 standard drinks    Types: 2 - 4 Cans of beer per week    Comment: beer-occasionally  . Drug use: No    ALLERGIES:  is allergic to keppra [levetiracetam] and lyrica [pregabalin].  MEDICATIONS:   Current Outpatient Medications  Medication Sig Dispense Refill  . albuterol (PROVENTIL HFA;VENTOLIN HFA) 108 (90 Base) MCG/ACT inhaler Inhale 2 puffs into the lungs every 6 (six) hours as needed for wheezing or shortness of breath.     Marland Kitchen aspirin EC 81 MG tablet Take 81 mg by mouth daily.    Marland Kitchen azithromycin (ZITHROMAX) 500 MG tablet Take 1 tablet (500 mg total) by mouth daily. 30 tablet 4  . benzonatate (TESSALON PERLES) 100 MG capsule Take 1 capsule (100 mg total) by mouth 3 (three) times daily as needed for cough. 90 capsule 3  . Cholecalciferol (VITAMIN D3) 1000 units CAPS Take 2,000 Units by mouth daily.     Marland Kitchen ethambutol (MYAMBUTOL) 400 MG tablet Take 1.5 tablets (600 mg total) by mouth daily. 45 tablet 4  . Fluticasone-Salmeterol (ADVAIR DISKUS) 250-50 MCG/DOSE AEPB Inhale 1 puff into the lungs every 12 (twelve) hours.     Marland Kitchen guaiFENesin (MUCINEX) 600 MG 12 hr tablet Take 1 tablet (600 mg total) by mouth 2 (two) times daily. 30 tablet 0  . guaiFENesin-codeine 100-10 MG/5ML syrup Take 5 mLs by mouth every 4 (four) hours as needed for cough. 120 mL 0  . ipratropium-albuterol (DUONEB) 0.5-2.5 (3) MG/3ML SOLN TAKE 3 MLS BY NEBULIZATION EVERY 4 (FOUR) HOURS AS NEEDED. (Patient taking differently: Take 3 mLs by nebulization 4 (four) times daily. ) 360 mL 3  . potassium chloride SA (K-DUR,KLOR-CON) 20 MEQ tablet Take 1.5  tablets (30 mEq total) by mouth daily. 40 tablet 3  . Respiratory Therapy Supplies (FLUTTER) DEVI 1 each by Does not apply route daily. 1 each 0  . rifabutin (MYCOBUTIN) 150 MG capsule Take 2 capsules (300 mg total) by mouth daily. 60 capsule 4  . venlafaxine XR (EFFEXOR-XR) 37.5 MG 24 hr capsule Take 37.5 mg by mouth daily with breakfast.     . vitamin B-12 (CYANOCOBALAMIN) 1000 MCG tablet Take 1,000 mcg by mouth daily.     No current facility-administered medications for this visit.    Facility-Administered Medications Ordered in Other Visits  Medication Dose Route Frequency  Provider Last Rate Last Dose  . sodium chloride flush (NS) 0.9 % injection 10 mL  10 mL Intravenous PRN Cammie Sickle, MD        PHYSICAL EXAMINATION: ECOG PERFORMANCE STATUS: 1 - Symptomatic but completely ambulatory  BP 135/77 (BP Location: Left Arm, Patient Position: Sitting, Cuff Size: Normal)   Pulse 100   Temp (!) 96.8 F (36 C) (Tympanic)   Wt 97 lb 3.2 oz (44.1 kg)   BMI 18.37 kg/m   Filed Weights   06/10/19 1018  Weight: 97 lb 3.2 oz (44.1 kg)    Physical Exam  Constitutional: She is oriented to person, place, and time.  Thin built Caucasian female patient.  She is alone.   HENT:  Head: Normocephalic and atraumatic.  Mouth/Throat: Oropharynx is clear and moist. No oropharyngeal exudate.  Eyes: Pupils are equal, round, and reactive to light.  Neck: Normal range of motion. Neck supple.  Cardiovascular: Normal rate and regular rhythm.  Pulmonary/Chest: No respiratory distress. She has no wheezes.  Decreased air entry bilaterally.  Abdominal: Soft. Bowel sounds are normal. She exhibits no distension and no mass. There is no abdominal tenderness. There is no rebound and no guarding.  Musculoskeletal: Normal range of motion.        General: No tenderness or edema.  Neurological: She is alert and oriented to person, place, and time.  Skin: Skin is warm.  Psychiatric: Affect normal.   LABORATORY DATA:  I have reviewed the data as listed    Component Value Date/Time   NA 133 (L) 06/10/2019 0951   NA 137 07/30/2018 1245   K 3.7 06/10/2019 0951   CL 100 06/10/2019 0951   CO2 23 06/10/2019 0951   GLUCOSE 124 (H) 06/10/2019 0951   BUN 14 06/10/2019 0951   BUN 11 07/30/2018 1245   CREATININE 0.58 06/10/2019 0951   CALCIUM 9.3 06/10/2019 0951   PROT 7.9 06/10/2019 0951   ALBUMIN 3.9 06/10/2019 0951   AST 21 06/10/2019 0951   ALT 15 06/10/2019 0951   ALKPHOS 170 (H) 06/10/2019 0951   BILITOT 0.4 06/10/2019 0951   GFRNONAA >60 06/10/2019 0951   GFRAA >60  06/10/2019 0951    No results found for: SPEP, UPEP  Lab Results  Component Value Date   WBC 7.8 06/10/2019   NEUTROABS 5.3 06/10/2019   HGB 11.3 (L) 06/10/2019   HCT 35.4 (L) 06/10/2019   MCV 87.8 06/10/2019   PLT 426 (H) 06/10/2019      Chemistry      Component Value Date/Time   NA 133 (L) 06/10/2019 0951   NA 137 07/30/2018 1245   K 3.7 06/10/2019 0951   CL 100 06/10/2019 0951   CO2 23 06/10/2019 0951   BUN 14 06/10/2019 0951   BUN 11 07/30/2018 1245   CREATININE 0.58 06/10/2019 0951  Component Value Date/Time   CALCIUM 9.3 06/10/2019 0951   ALKPHOS 170 (H) 06/10/2019 0951   AST 21 06/10/2019 0951   ALT 15 06/10/2019 0951   BILITOT 0.4 06/10/2019 0951       RADIOGRAPHIC STUDIES: I have personally reviewed the radiological images as listed and agreed with the findings in the report. No results found.   ASSESSMENT & PLAN:  Malignant neoplasm of right upper lobe of lung ([place]) # Right upper lobe stage I lung cancer; s/p feb February 2019 SBRT; less likely recurrence.  CT scan-August 31st-stable [see below]  # Left lung stage III lung cancer-clinically stable./See below  #Bi-Lateral MAC infection-on ethambutol at least until Nov 2020 [~9-12 months as per Claudia Chavez].  CT scan June 02, 2019-stable cavitary lesions x2 right upper lobe; bilateral subcentimeter lung nodules-stable; stable mediastinal adenopathy.  Discussed that we will plan to repeat imaging in approximately 6 months. Will also check with pul/ID.   #Anemia-likely secondary to chronic inflammation-hemoglobin 11.3-stable  # mild hyponatremia-question etiology 133; stable  # DISPOSITION: # Follow up in 3 months- MD- cbc/Cmp/port flush- ClaudiaB  # I reviewed the blood work- with the patient in detail; also reviewed the imaging independently [as summarized above]; and with the patient in detail.   Cc: Drs. Kasa/Ravikrishnan    Orders Placed This Encounter  Procedures  . CBC with  Differential    Standing Status:   Future    Standing Expiration Date:   06/09/2020  . Comprehensive metabolic panel    Standing Status:   Future    Standing Expiration Date:   06/09/2020   All questions were answered. The patient knows to call the clinic with any problems, questions or concerns.      Cammie Sickle, MD 06/10/2019 11:40 AM

## 2019-06-10 NOTE — Assessment & Plan Note (Addendum)
#   Right upper lobe stage I lung cancer; s/p feb February 2019 SBRT; less likely recurrence.  CT scan-August 31st-stable [see below]  # Left lung stage III lung cancer-clinically stable./See below  #Bi-Lateral MAC infection-on ethambutol at least until Nov 2020 [~9-12 months as per Dr.Ravishankar].  CT scan June 02, 2019-stable cavitary lesions x2 right upper lobe; bilateral subcentimeter lung nodules-stable; stable mediastinal adenopathy.  Discussed that we will plan to repeat imaging in approximately 6 months. Will also check with pul/ID.   #Anemia-likely secondary to chronic inflammation-hemoglobin 11.3-stable  # mild hyponatremia-question etiology 133; stable  # DISPOSITION: # Follow up in 3 months- MD- cbc/Cmp/port flush- Dr.B  # I reviewed the blood work- with the patient in detail; also reviewed the imaging independently [as summarized above]; and with the patient in detail.   Cc: Drs. Kasa/Ravikrishnan

## 2019-06-17 ENCOUNTER — Ambulatory Visit: Payer: PPO | Attending: Infectious Diseases | Admitting: Infectious Diseases

## 2019-06-17 ENCOUNTER — Encounter: Payer: Self-pay | Admitting: Infectious Diseases

## 2019-06-17 ENCOUNTER — Other Ambulatory Visit: Payer: Self-pay

## 2019-06-17 VITALS — BP 137/80 | HR 93 | Temp 97.8°F | Wt 99.0 lb

## 2019-06-17 DIAGNOSIS — D649 Anemia, unspecified: Secondary | ICD-10-CM

## 2019-06-17 DIAGNOSIS — Z9221 Personal history of antineoplastic chemotherapy: Secondary | ICD-10-CM

## 2019-06-17 DIAGNOSIS — Z79899 Other long term (current) drug therapy: Secondary | ICD-10-CM

## 2019-06-17 DIAGNOSIS — F17211 Nicotine dependence, cigarettes, in remission: Secondary | ICD-10-CM | POA: Diagnosis not present

## 2019-06-17 DIAGNOSIS — Z888 Allergy status to other drugs, medicaments and biological substances status: Secondary | ICD-10-CM

## 2019-06-17 DIAGNOSIS — Z902 Acquired absence of lung [part of]: Secondary | ICD-10-CM

## 2019-06-17 DIAGNOSIS — J449 Chronic obstructive pulmonary disease, unspecified: Secondary | ICD-10-CM

## 2019-06-17 DIAGNOSIS — Z792 Long term (current) use of antibiotics: Secondary | ICD-10-CM | POA: Diagnosis not present

## 2019-06-17 DIAGNOSIS — A310 Pulmonary mycobacterial infection: Secondary | ICD-10-CM | POA: Diagnosis not present

## 2019-06-17 DIAGNOSIS — Z9889 Other specified postprocedural states: Secondary | ICD-10-CM | POA: Diagnosis not present

## 2019-06-17 DIAGNOSIS — C3431 Malignant neoplasm of lower lobe, right bronchus or lung: Secondary | ICD-10-CM

## 2019-06-17 DIAGNOSIS — Z7951 Long term (current) use of inhaled steroids: Secondary | ICD-10-CM | POA: Diagnosis not present

## 2019-06-17 DIAGNOSIS — G629 Polyneuropathy, unspecified: Secondary | ICD-10-CM | POA: Diagnosis not present

## 2019-06-17 NOTE — Progress Notes (Signed)
NAME: Claudia Chavez  DOB: November 13, 1955  MRN: [id number]  Date/Time: 06/17/2019 11:05 AM  Dr.Kasa Maretta Bees Subjective:  follow up visit for pulmonary MAC infection Started treatment Dec 02, 2018 Last visit was a virtual visit Currently on azithromycin 500 mg once a day, ethambutol 45m/kg body weight (6034m and rifabutin  300 mg a day.  She says she is doing very well.  Has gained 9 pounds of weight.  She is no longer anemic and has not needed any blood transfusion in the last 5 months.  She has no nausea or vomiting or diarrhea.  No rash.  Appetite is improved a lot.  She has more energy now. CAT scan of the chest done on 06/02/2019 which showed no change in the examination.  There was no new or progressive interval changes.  The thick-walled cavitary lesions in the right lung were similar in size.  She saw Dr. BrLynett Fisher oncologist on 06/10/2019.  His note says that the left lung stage III cancer is clinically stable.  The plan was to repeat another CT chest in 6 months time.   Medical history She is a [age]ear old female who has a  history of Lung cancer , COPD Patient was diagnosed non-small cell right lung cancer in July 2016 and underwent 2 cycles of chemotherapy with Taxol and carboplatin  .   She developed  neuropathy.  And chemo was stopped.  She had right lower lobe [facility]  [facility] OaFaith Rogue This was followed by radiation therapy to the right lung in Fe2017-03-03or 30 days.  In May 2017 a repeat CAT scan was noted as interval right lower lobe resection without evidence of local recurrence or definite metastatic disease. IShe was followed by oncology with serial CAT scans and PET scans. PET scan Feb 9th 2018- no evidence of recurrence; postoperative changes/stable right hilar lymph node without any activity noted. On 05/25/17 CT showed partial cavitary right upper lobe pulmonary nodule, most consistent with metastatic disease versus metachronous primary.  She was seen  by pulmonologist Dr.Casa and underwent bronch and biopsy on 10/25/17.  Pathology report was VERY SCANT LUNG TISSUE WITH PIGMENTED MACROPHAGES. - NO NEOPLASM IS IDENTIFIED IN THIS SPECIMEN She received  SBRT  to the right lung X 5 doses-   Subsequently she was complaining of cough and productive sputum and she was treated with multiple courses of antibiotics including Levaquin.  A repeat CT scan was done in  Jan 2020 and it showed Thick-walled, partially cavitary process in the right lung apex measuring 5.4 x 7.4 cm (series 3/image 34), progressive, previously 4.2 x 2.9 cm.  Additional thick-walled cavitary process posteriorly in the right upper lobe measuring 8.1 x 5.5 cm (series 3/image 61), previously 7.7 x 5.1 cm when measured in a similar fashion.      On 10/10/2018 she underwent bronchoscopy and bronchoalveolar lavage was sent for AFB and fungal and bacterial cultures.  The pathology showed BENIGN ALVEOLATED LUNG PARENCHYMA AND BRONCHIAL WALL WITH FOCAL  ORGANIZING FIBRIN. - NO EVIDENCE OF TUMOR OR GRANULOMA.    AFB and GMS special stains were negative. The AFB nucleic acid amplification testing was done and that was positive for Mycobacterium avium intracellulare and she was referred to me in Fe03-02-20 Marland Kitchen  She was a smoker until December 2019.  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 Husband died in 202018-03-03f lung cancer as well.  Past Medical History:  Diagnosis Date  Cancer of lower lobe of right lung ([place]) 05/07/2015   COPD (chronic obstructive pulmonary disease) ([facility]CC)    Dyspnea    Non-small cell lung cancer ([place])    Pneumonia 04/2015   Pulmonary Mycobacterium avium complex (MAC) infection ([place]) 10/25/2018    Past Surgical History:  Procedure Laterality Date   ABDOMINAL HYSTERECTOMY     COLONOSCOPY WITH PROPOFOL N/A 06/09/2018   Procedure: COLONOSCOPY WITH PROPOFOL;  Surgeon: Toledo, Benay Pike, MD;  Location: [facility];  Service:  Gastroenterology;  Laterality: N/A;   ELBOW SURGERY Right 1995   ELECTROMAGNETIC NAVIGATION BROCHOSCOPY N/A 10/25/2017   Procedure: ELECTROMAGNETIC NAVIGATION BRONCHOSCOPY;  Surgeon: [name], MD;  Location: [facility] OR[facility];  Service: Cardiopulmonary;  Laterality: N/A;   ESOPHAGOGASTRODUODENOSCOPY N/A 06/08/2018   Procedure: ESOPHAGOGASTRODUODENOSCOPY (EGD);  Surgeon: Toledo, Benay Pike, MD;  Location: [facility];  Service: Gastroenterology;  Laterality: N/A;   FLEXIBLE BRONCHOSCOPY Right 10/10/2018   Procedure: FLEXIBLE BRONCHOSCOPY;  Surgeon: [name], MD;  Location: [facility];  Service: Cardiopulmonary;  Laterality: Right;   PORTACATH PLACEMENT Right 05/10/2015   Procedure: INSERTION PORT-A-CATH;  Surgeon: Nestor Lewandowsky, MD;  Location: [facility] OR[facility];  Service: General;  Laterality: Right;   VIDEO ASSISTED THORACOSCOPY (VATS)/THOROCOTOMY Right 08/18/2015   Procedure: VIDEO ASSISTED THORACOSCOPY (VATS)/THOROCOTOMY;  Surgeon: Nestor Lewandowsky, MD;  Location: [facility];  Service: General;  Laterality: Right;    Social History   Socioeconomic History   Marital status: Widowed    Spouse name: Not on file   Number of children: Not on file   Years of education: Not on file   Highest education level: Not on file  Occupational History   Not on file  Social Needs   Financial resource strain: Not on file   Food insecurity    Worry: Not on file    Inability: Not on file   Transportation needs    Medical: Not on file    Non-medical: Not on file  Tobacco Use   Smoking status: Former Smoker    Packs/day: 0.50    Years: 40.00    Pack years: 20.00    Types: Cigarettes    Quit date: 09/12/2018    Years since quitting: 0.7   Smokeless tobacco: Never Used  Substance and Sexual Activity   Alcohol use: Not Currently    Alcohol/week: 2.0 - 4.0 standard drinks    Types: 2 - 4 Cans of beer per week    Comment: beer-occasionally   Drug use: No   Sexual activity: Never  Lifestyle    Physical activity    Days per week: Not on file    Minutes per session: Not on file   Stress: Not on file  Relationships   Social connections    Talks on phone: Not on file    Gets together: Not on file    Attends religious service: Not on file    Active member of club or organization: Not on file    Attends meetings of clubs or organizations: Not on file    Relationship status: Not on file   Intimate partner violence    Fear of current or ex partner: Not on file    Emotionally abused: Not on file    Physically abused: Not on file    Forced sexual activity: Not on file  Other Topics Concern   Not on file  Social History Narrative   Not on file    Family History  Problem Relation Age of Onset   Stroke Mother  Lung cancer Father 11   Allergies  Allergen Reactions   Keppra [Levetiracetam] Other (See Comments)    Nausea and dizzy   Lyrica [Pregabalin] Other (See Comments)    Made patient feel very dizzy and not feel good.     ? Current Outpatient Medications  Medication Sig Dispense Refill   albuterol (PROVENTIL HFA;VENTOLIN HFA) 108 (90 Base) MCG/ACT inhaler Inhale 2 puffs into the lungs every 6 (six) hours as needed for wheezing or shortness of breath.      aspirin EC 81 MG tablet Take 81 mg by mouth Claudia.     azithromycin (ZITHROMAX) 500 MG tablet Take 1 tablet (500 mg total) by mouth Claudia. 30 tablet 4   benzonatate (TESSALON PERLES) 100 MG capsule Take 1 capsule (100 mg total) by mouth 3 (three) times Claudia as needed for cough. 90 capsule 3   Cholecalciferol (VITAMIN D3) 1000 units CAPS Take 2,000 Units by mouth Claudia.      ethambutol (MYAMBUTOL) 400 MG tablet Take 1.5 tablets (600 mg total) by mouth Claudia. 45 tablet 4   Fluticasone-Salmeterol (ADVAIR DISKUS) 250-50 MCG/DOSE AEPB Inhale 1 puff into the lungs every 12 (twelve) hours.      guaiFENesin (MUCINEX) 600 MG 12 hr tablet Take 1 tablet (600 mg total) by mouth 2 (two) times Claudia. 30 tablet 0     guaiFENesin-codeine 100-10 MG/5ML syrup Take 5 mLs by mouth every 4 (four) hours as needed for cough. 120 mL 0   ipratropium-albuterol (DUONEB) 0.5-2.5 (3) MG/3ML SOLN TAKE 3 MLS BY NEBULIZATION EVERY 4 (FOUR) HOURS AS NEEDED. (Patient taking differently: Take 3 mLs by nebulization 4 (four) times Claudia. ) 360 mL 3   potassium chloride SA (K-DUR,KLOR-CON) 20 MEQ tablet Take 1.5 tablets (30 mEq total) by mouth Claudia. 40 tablet 3   Respiratory Therapy Supplies (FLUTTER) DEVI 1 each by Does not apply route Claudia. 1 each 0   rifabutin (MYCOBUTIN) 150 MG capsule Take 2 capsules (300 mg total) by mouth Claudia. 60 capsule 4   venlafaxine XR (EFFEXOR-XR) 37.5 MG 24 hr capsule Take 37.5 mg by mouth Claudia with breakfast.      vitamin B-12 (CYANOCOBALAMIN) 1000 MCG tablet Take 1,000 mcg by mouth Claudia.     No current facility-administered medications for this visit.    Facility-Administered Medications Ordered in Other Visits  Medication Dose Route Frequency Provider Last Rate Last Dose   sodium chloride flush (NS) 0.9 % injection 10 mL  10 mL Intravenous PRN Cammie Sickle, MD         Abtx:  Anti-infectives (From admission, onward)   None      REVIEW OF SYSTEMS:  Const: negative fever, negative chills, steady weight of 96 pounds Eyes: negative diplopia or visual changes, negative eye pain ENT: negative coryza, negative sore throat Resp: Positive cough, no hemoptysis, has dyspnea Cards: negative for chest pain, palpitations, lower extremity edema GU: negative for frequency, dysuria and hematuria GI: Negative for abdominal pain, diarrhea, bleeding, constipation Skin: negative for rash and pruritus Heme: negative for easy bruising and gum/nose bleeding MS: Generalized fatigue and weakness Neurolo:negative for headaches, has dizziness, vertigo, memory problems  Psych:  anxiety, depression  Endocrine: No polyuria or polydipsia Allergy/Immunology-allergy to Keppra and Lyrica.  Has  side effects like dizziness and nausea Objective:  VITALS:  BP 137/80 (BP Location: Left Arm, Patient Position: Sitting, Cuff Size: Normal)    Pulse 93    Temp 97.8 F (36.6 C) (Oral)    Wt 99  lb (44.9 kg)    BMI 18.71 kg/m PHYSICAL EXAM:  General: Alert, cooperative, no distress, appears stated age.  Pale,thin built Head: Normocephalic, without obvious abnormality, atraumatic. Eyes: Conjunctivae clear, anicteric sclerae. Pupils are equal ENT Nares normal. No drainage or sinus tenderness. Lips, mucosa, and tongue normal. No Thrush Neck: Supple, symmetrical, no adenopathy, thyroid: non tender no carotid bruit and no JVD. Back: No CVA tenderness. Lungs: Bilateral air entry crepitations present on the right side Heart: S1-S2 Abdomen: Soft, non-tender,not distended. Bowel sounds normal. No masses Extremities: atraumatic, no cyanosis. No edema. No clubbing Skin: No rashes or lesions. Or bruising Lymph: Cervical, supraclavicular normal. Neurologic: Grossly non-focal Pertinent Labs Lab Results CBC    Component Value Date/Time   WBC 7.8 06/10/2019 0951   RBC 4.03 06/10/2019 0951   HGB 11.3 (L) 06/10/2019 0951   HCT 35.4 (L) 06/10/2019 0951   PLT 426 (H) 06/10/2019 0951   MCV 87.8 06/10/2019 0951   MCH 28.0 06/10/2019 0951   MCHC 31.9 06/10/2019 0951   RDW 17.4 (H) 06/10/2019 0951   LYMPHSABS 1.5 06/10/2019 0951   MONOABS 0.7 06/10/2019 0951   EOSABS 0.2 06/10/2019 0951   BASOSABS 0.1 06/10/2019 0951    CMP Latest Ref Rng & Units 06/10/2019 06/02/2019 04/07/2019  Glucose 70 - 99 mg/dL 124(H) - 124(H)  BUN 8 - 23 mg/dL 14 - 14  Creatinine 0.44 - 1.00 mg/dL 0.58 0.60 0.50  Sodium 135 - 145 mmol/L 133(L) - 128(L)  Potassium 3.5 - 5.1 mmol/L 3.7 - 3.9  Chloride 98 - 111 mmol/L 100 - 95(L)  CO2 22 - 32 mmol/L 23 - 22  Calcium 8.9 - 10.3 mg/dL 9.3 - 9.1  Total Protein 6.5 - 8.1 g/dL 7.9 - 7.6  Total Bilirubin 0.3 - 1.2 mg/dL 0.4 - 0.4  Alkaline Phos 38 - 126 U/L 170(H) - 152(H)  AST  15 - 41 U/L 21 - 19  ALT 0 - 44 U/L 15 - 11      Microbiology: 10/10/2018 acid-fast organism ID by nucleic acid amplification test is M avium complex.  IMAGING RESULTS: ?As above Impression/Recommendation 63 y.o.female  with a history of Lung cancer , COPD  ? ? Fibrocavitary  lesions in the right lung.  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   She is on 3 drug combination. On  azithromycin  500 mg once a day, ethambutol 55m/kg body weight (6034m and rifabutin  300 mg a day since February 2020.. Marland Kitchen  She has done very well.  She is gained 9 pounds weight.  She is no longer fatigue.  Cough is at baseline as she has COPD. Recent labs done on 06/10/2019 shows an AST of 21, ALT of 15 and alkaline phosphatase of 170.  Creatinine is 0.58. We need to send 3 sputum's for AFB to see whether she has eradicated the MAC.  She will need another 6 months minimum of treatment if the sputum is negative.  It can be extended up to another year.  She needs to have a follow-up ophthalmology appointment as she is on ethambutol.  Anemia which had necessitated blood transfusion in the past has been stable and hemoglobin is improved and is currently 11.3.  Right lung carcinoma.  Status post right lower lobectomy, radiation, recurrence, SBRT.  Followed by Dr. BrYevette Edwardsnd Dr. KaMortimer FriesA repeat CAT scan is being planned in 6 months.  COPD on inhalers and nebulizers Smoker: Quit smoking December 2019  ___________________________________________________ Discussed the management in great detail with the patient.  50% of the time was spent on counseling her on adherence to medications, side effects and duration of treatment.

## 2019-06-17 NOTE — Patient Instructions (Signed)
You are on 6 months of medication for pulmonary MAC . We need to get 3 sputums for afb culture to see whether the bacteria is eradicated. Following which we can decide how long to continue treatment. You may need up to another 6-9 months of Rx.

## 2019-06-18 MED FILL — RIFABUTIN 150 MG CAPSULE: 150 | 30 days supply | Qty: 60 | Fill #2

## 2019-06-18 MED FILL — ETHAMBUTOL HCL 400 MG TAB: 400 | 30 days supply | Qty: 45 | Fill #2

## 2019-06-18 MED FILL — AZITHROMYCIN 500 MG TABLET: 500 | 30 days supply | Qty: 30 | Fill #2

## 2019-06-23 ENCOUNTER — Other Ambulatory Visit
Admission: RE | Admit: 2019-06-23 | Discharge: 2019-06-23 | Disposition: A | Discharge status: Home / Self Care | Payer: PPO | Source: Ambulatory Visit | Attending: Infectious Diseases | Admitting: Infectious Diseases

## 2019-06-23 DIAGNOSIS — A310 Pulmonary mycobacterial infection: Secondary | ICD-10-CM | POA: Diagnosis not present

## 2019-06-24 LAB — ACID FAST SMEAR (AFB, MYCOBACTERIA)
Acid Fast Smear: NEGATIVE
Acid Fast Smear: POSITIVE
Acid Fast Smear: NEGATIVE

## 2019-07-01 LAB — MAC SUSCEPTIBILITY BROTH

## 2019-07-07 LAB — MAC SUSCEPTIBILITY BROTH

## 2019-07-08 LAB — AFB ORGANISM ID BY DNA PROBE
M avium complex: POSITIVE — AB
M avium complex: POSITIVE — AB
M tuberculosis complex: NEGATIVE
M tuberculosis complex: NEGATIVE

## 2019-07-08 LAB — ACID FAST CULTURE WITH REFLEXED SENSITIVITIES (MYCOBACTERIA)
Acid Fast Culture: POSITIVE — AB
Acid Fast Culture: POSITIVE — AB

## 2019-07-16 LAB — MAC SUSCEPTIBILITY BROTH
Amikacin: 8
Clarithromycin: 2
Linezolid: 32
Moxifloxacin: 8
Streptomycin: 32

## 2019-07-16 LAB — AFB ORGANISM ID BY DNA PROBE
M avium complex: POSITIVE — AB
M tuberculosis complex: NEGATIVE

## 2019-07-16 LAB — ACID FAST CULTURE WITH REFLEXED SENSITIVITIES (MYCOBACTERIA): Acid Fast Culture: POSITIVE — AB

## 2019-08-12 DIAGNOSIS — Z0000 Encounter for general adult medical examination without abnormal findings: Secondary | ICD-10-CM | POA: Diagnosis not present

## 2019-08-12 DIAGNOSIS — J449 Chronic obstructive pulmonary disease, unspecified: Secondary | ICD-10-CM | POA: Diagnosis not present

## 2019-08-12 DIAGNOSIS — Z23 Encounter for immunization: Secondary | ICD-10-CM | POA: Diagnosis not present

## 2019-08-12 DIAGNOSIS — D649 Anemia, unspecified: Secondary | ICD-10-CM | POA: Diagnosis not present

## 2019-08-12 DIAGNOSIS — F329 Major depressive disorder, single episode, unspecified: Secondary | ICD-10-CM | POA: Diagnosis not present

## 2019-08-14 ENCOUNTER — Other Ambulatory Visit: Payer: Self-pay | Admitting: Family Medicine

## 2019-08-14 DIAGNOSIS — Z1231 Encounter for screening mammogram for malignant neoplasm of breast: Secondary | ICD-10-CM

## 2019-09-08 ENCOUNTER — Other Ambulatory Visit: Payer: Self-pay

## 2019-09-09 ENCOUNTER — Inpatient Hospital Stay (HOSPITAL_BASED_OUTPATIENT_CLINIC_OR_DEPARTMENT_OTHER): Payer: PPO | Admitting: Internal Medicine

## 2019-09-09 ENCOUNTER — Inpatient Hospital Stay: Payer: PPO | Attending: Internal Medicine

## 2019-09-09 ENCOUNTER — Other Ambulatory Visit: Payer: Self-pay

## 2019-09-09 VITALS — BP 147/85 | HR 91 | Temp 97.5°F | Wt 96.6 lb

## 2019-09-09 DIAGNOSIS — R05 Cough: Secondary | ICD-10-CM | POA: Diagnosis not present

## 2019-09-09 DIAGNOSIS — C3411 Malignant neoplasm of upper lobe, right bronchus or lung: Secondary | ICD-10-CM | POA: Diagnosis not present

## 2019-09-09 DIAGNOSIS — A312 Disseminated mycobacterium avium-intracellulare complex (DMAC): Secondary | ICD-10-CM | POA: Insufficient documentation

## 2019-09-09 DIAGNOSIS — Z87891 Personal history of nicotine dependence: Secondary | ICD-10-CM | POA: Diagnosis not present

## 2019-09-09 DIAGNOSIS — Z95828 Presence of other vascular implants and grafts: Secondary | ICD-10-CM

## 2019-09-09 DIAGNOSIS — J449 Chronic obstructive pulmonary disease, unspecified: Secondary | ICD-10-CM | POA: Diagnosis not present

## 2019-09-09 DIAGNOSIS — R5383 Other fatigue: Secondary | ICD-10-CM | POA: Insufficient documentation

## 2019-09-09 DIAGNOSIS — C3492 Malignant neoplasm of unspecified part of left bronchus or lung: Secondary | ICD-10-CM | POA: Insufficient documentation

## 2019-09-09 DIAGNOSIS — R0602 Shortness of breath: Secondary | ICD-10-CM | POA: Diagnosis not present

## 2019-09-09 DIAGNOSIS — D649 Anemia, unspecified: Secondary | ICD-10-CM | POA: Diagnosis not present

## 2019-09-09 LAB — COMPREHENSIVE METABOLIC PANEL
ALT: 10 U/L (ref 0–44)
AST: 17 U/L (ref 15–41)
Albumin: 3.1 g/dL — ABNORMAL LOW (ref 3.5–5.0)
Alkaline Phosphatase: 176 U/L — ABNORMAL HIGH (ref 38–126)
Anion gap: 12 (ref 5–15)
BUN: 9 mg/dL (ref 8–23)
CO2: 22 mmol/L (ref 22–32)
Calcium: 8.7 mg/dL — ABNORMAL LOW (ref 8.9–10.3)
Chloride: 98 mmol/L (ref 98–111)
Creatinine, Ser: 0.52 mg/dL (ref 0.44–1.00)
GFR calc Af Amer: >60 mL/min (ref >60)
GFR calc non Af Amer: >60 mL/min (ref >60)
Glucose, Bld: 111 mg/dL — ABNORMAL HIGH (ref 70–99)
Potassium: 3.8 mmol/L (ref 3.5–5.1)
Sodium: 132 mmol/L — ABNORMAL LOW (ref 135–145)
Total Bilirubin: 0.4 mg/dL (ref 0.3–1.2)
Total Protein: 7.6 g/dL (ref 6.5–8.1)

## 2019-09-09 LAB — CBC WITH DIFFERENTIAL/PLATELET
Abs Immature Granulocytes: 0.03 10*3/uL (ref 0.00–0.07)
Basophils Absolute: 0.1 10*3/uL (ref 0.0–0.1)
Basophils Relative: 1 %
Eosinophils Absolute: 0.2 10*3/uL (ref 0.0–0.5)
Eosinophils Relative: 2 %
HCT: 33.4 % — ABNORMAL LOW (ref 36.0–46.0)
Hemoglobin: 10.5 g/dL — ABNORMAL LOW (ref 12.0–15.0)
Immature Granulocytes: 0 %
Lymphocytes Relative: 16 %
Lymphs Abs: 1.2 10*3/uL (ref 0.7–4.0)
MCH: 28.7 pg (ref 26.0–34.0)
MCHC: 31.4 g/dL (ref 30.0–36.0)
MCV: 91.3 fL (ref 80.0–100.0)
Monocytes Absolute: 0.7 10*3/uL (ref 0.1–1.0)
Monocytes Relative: 9 %
Neutro Abs: 5.6 10*3/uL (ref 1.7–7.7)
Neutrophils Relative %: 72 %
Platelets: 470 10*3/uL — ABNORMAL HIGH (ref 150–400)
RBC: 3.66 MIL/uL — ABNORMAL LOW (ref 3.87–5.11)
RDW: 16 % — ABNORMAL HIGH (ref 11.5–15.5)
WBC: 7.8 10*3/uL (ref 4.0–10.5)
nRBC: 0 % (ref 0.0–0.2)

## 2019-09-09 MED ORDER — SODIUM CHLORIDE 0.9% FLUSH
10.0000 mL | Freq: Once | INTRAVENOUS | Status: AC
  Administered 2019-09-09: 10 mL via INTRAVENOUS
  Filled 2019-09-09: qty 10

## 2019-09-09 MED ORDER — IPRATROPIUM-ALBUTEROL 0.5-2.5 (3) MG/3ML IN SOLN: 3.0000 mL | RESPIRATORY_TRACT | 3 refills | Status: DC | PRN

## 2019-09-09 MED ORDER — HEPARIN SOD (PORK) LOCK FLUSH 100 UNIT/ML IV SOLN
500.0000 [IU] | Freq: Once | INTRAVENOUS | Status: AC
  Administered 2019-09-09: 500 [IU] via INTRAVENOUS

## 2019-09-09 NOTE — Progress Notes (Signed)
[facility]aud[facility] OFFICE PROGRESS NOTE  Patient Care Team: [place] Pink, MD as PCP - General (Family Medicine) Cammie Sickle, MD as Medical Oncologist (Medical Oncology) Rockey Situ, Kathlene November, MD as Consulting Physician (Cardiology) Efrain Sella, MD as Consulting Physician (Gastroenterology) Noreene Filbert, MD as Referring Physician (Radiation Oncology)  Cancer Staging Malignant neoplasm of lung [facility]) Staging form: Lung, AJCC 7th Edition - Clinical: Stage IIIA (T3, N2, M0) - Signed by Forest Gleason, MD on 05/07/2015    Oncology History Overview Note  # Squamous cell carcinoma of right LOWER LOBE OF lung stage IIIA based on PET scan Biopsy of the (August of 2016). 2. After 3 cycles of chemotherapy patient underwent resection of lung mass (November, 2016) ypT2B ypN0 [Dr.Oaks] status post right lower lobectomy with a 3. Claudia Chavez was started on carboplatinum and Taxol on a weekly basis and radiation therapy from January of 2017 4. After 2 cycles of carboplatinum patient had neuropathy grade 2 interfering with work so chemotherapy was put on hold and radiation was continued (January 19th, 2017) 5. Chemotherapy was discontinued because of progressive neuropathy. Patient is finishing of radiation therapy on November 19, 2015  # NOV 2017- CT- 67m right hilar LN; PET- Feb 2018- NED.   # DEC-JAN 2019- RUL Lung nodule [s/p Bronch; Dr.Kasa- non-diagnostic] SBRT Feb 2019  #October 10, 2018 bronchoscopy [Dr.Kasa]-negative for malignancy/positive for MAC infection-ethambutol [Dr.Ravishankar]  # AUG 2019- R LE DVT [xarelto]x STOP in end of dec 2019. Syncope- MRI- 13 mm cystic leision/asymptomatic/[facility]; Bil subacute cerebellar stroke;  AUG 2019 - GIB/anemia [EGD/colo- Dr.Toledo; NEG]; Aug 2019-  Severe hypokalemia- sec to Florinef-resolved.   DIAGNOSIS: RLL stage III lung ca; RUL stage I   GOALS: curative  CURRENT/MOST RECENT THERAPY : Surveillaince     Malignant neoplasm of lung (HCC)  Malignant neoplasm of right upper lobe of lung (HCC)      INTERVAL HISTORY:  Claudia CARRIER89y.o.  female pleasant patient above history of stage III lung cancer; and also stage I right upper lobe status post radiation February 2019 is here for follow-up.  Patient continues to be on treatment for for MAC infection with ethambutol/antibiotics as per ID.  Patient's recent sputum cultures-September 2020 + for MAC.  Complains of mild to moderate fatigue.  Otherwise chronic shortness of breath chronic cough since sputum production.  No blood.  No dizzy spells.  No nausea or vomiting.  No falls.  No headaches.  Review of Systems  Constitutional: Positive for malaise/fatigue. Negative for chills, diaphoresis, fever and weight loss.  HENT: Negative for nosebleeds and sore throat.   Eyes: Negative for double vision.  Respiratory: Positive for cough, sputum production and shortness of breath. Negative for hemoptysis and wheezing.   Cardiovascular: Negative for chest pain, palpitations, orthopnea and leg swelling.  Gastrointestinal: Negative for abdominal pain, blood in stool, constipation, diarrhea, heartburn, melena, nausea and vomiting.  Genitourinary: Negative for dysuria, frequency and urgency.  Musculoskeletal: Negative for back pain and joint pain.  Skin: Negative.  Negative for itching and rash.  Neurological: Negative for tingling, focal weakness, weakness and headaches.  Endo/Heme/Allergies: Does not bruise/bleed easily.  Psychiatric/Behavioral: Negative for depression. The patient is not nervous/anxious and does not have insomnia.       PAST MEDICAL HISTORY :  Past Medical History:  Diagnosis Date  . Cancer of lower lobe of right lung ([place] 05/07/2015  . COPD (chronic obstructive pulmonary disease) ([place]   . Dyspnea   . Non-small cell  lung cancer ([place])   . Pneumonia 04/2015  . Pulmonary Mycobacterium avium complex (MAC) infection ([place])  10/25/2018    PAST SURGICAL HISTORY :   Past Surgical History:  Procedure Laterality Date  . ABDOMINAL HYSTERECTOMY    . COLONOSCOPY WITH PROPOFOL N/A 06/09/2018   Procedure: COLONOSCOPY WITH PROPOFOL;  Surgeon: Toledo, Benay Pike, MD;  Location: [facility] ENDOSCOPY;  Service: Gastroenterology;  Laterality: N/A;  . ELBOW SURGERY Right 1995  . ELECTROMAGNETIC NAVIGATION BROCHOSCOPY N/A 10/25/2017   Procedure: ELECTROMAGNETIC NAVIGATION BRONCHOSCOPY;  Surgeon: [name], MD;  Location: [facility] ORS;  Service: Cardiopulmonary;  Laterality: N/A;  . ESOPHAGOGASTRODUODENOSCOPY N/A 06/08/2018   Procedure: ESOPHAGOGASTRODUODENOSCOPY (EGD);  Surgeon: Toledo, Benay Pike, MD;  Location: [facility] ENDOSCOPY;  Service: Gastroenterology;  Laterality: N/A;  . FLEXIBLE BRONCHOSCOPY Right 10/10/2018   Procedure: FLEXIBLE BRONCHOSCOPY;  Surgeon: [name], MD;  Location: [facility] ORS;  Service: Cardiopulmonary;  Laterality: Right;  . PORTACATH PLACEMENT Right 05/10/2015   Procedure: INSERTION PORT-A-CATH;  Surgeon: Nestor Lewandowsky, MD;  Location: [facility] ORS;  Service: General;  Laterality: Right;  Marland Kitchen VIDEO ASSISTED THORACOSCOPY (VATS)/THOROCOTOMY Right 08/18/2015   Procedure: VIDEO ASSISTED THORACOSCOPY (VATS)/THOROCOTOMY;  Surgeon: Nestor Lewandowsky, MD;  Location: [facility] ORS;  Service: General;  Laterality: Right;    FAMILY HISTORY :   Family History  Problem Relation Age of Onset  . Stroke Mother   . Lung cancer Father 66    SOCIAL HISTORY:   Social History   Tobacco Use  . Smoking status: Former Smoker    Packs/day: 0.50    Years: 40.00    Pack years: 20.00    Types: Cigarettes    Quit date: 09/12/2018    Years since quitting: 0.9  . Smokeless tobacco: Never Used  Substance Use Topics  . Alcohol use: Not Currently    Alcohol/week: 2.0 - 4.0 standard drinks    Types: 2 - 4 Cans of beer per week    Comment: beer-occasionally  . Drug use: No    ALLERGIES:  is allergic to keppra [levetiracetam] and lyrica  [pregabalin].  MEDICATIONS:  Current Outpatient Medications  Medication Sig Dispense Refill  . albuterol (PROVENTIL HFA;VENTOLIN HFA) 108 (90 Base) MCG/ACT inhaler Inhale 2 puffs into the lungs every 6 (six) hours as needed for wheezing or shortness of breath.     Marland Kitchen aspirin EC 81 MG tablet Take 81 mg by mouth daily.    Marland Kitchen azithromycin (ZITHROMAX) 500 MG tablet Take 1 tablet (500 mg total) by mouth daily. 30 tablet 4  . benzonatate (TESSALON PERLES) 100 MG capsule Take 1 capsule (100 mg total) by mouth 3 (three) times daily as needed for cough. 90 capsule 3  . Cholecalciferol (VITAMIN D3) 1000 units CAPS Take 2,000 Units by mouth daily.     Marland Kitchen ethambutol (MYAMBUTOL) 400 MG tablet Take 1.5 tablets (600 mg total) by mouth daily. 45 tablet 4  . Fluticasone-Salmeterol (ADVAIR DISKUS) 250-50 MCG/DOSE AEPB Inhale 1 puff into the lungs every 12 (twelve) hours.     Marland Kitchen ipratropium-albuterol (DUONEB) 0.5-2.5 (3) MG/3ML SOLN Take 3 mLs by nebulization every 4 (four) hours as needed. 360 mL 3  . Respiratory Therapy Supplies (FLUTTER) DEVI 1 each by Does not apply route daily. 1 each 0  . rifabutin (MYCOBUTIN) 150 MG capsule Take 2 capsules (300 mg total) by mouth daily. 60 capsule 4  . guaiFENesin (MUCINEX) 600 MG 12 hr tablet Take 1 tablet (600 mg total) by mouth 2 (two) times daily. (Patient not taking: Reported on 09/08/2019)  30 tablet 0  . guaiFENesin-codeine 100-10 MG/5ML syrup Take 5 mLs by mouth every 4 (four) hours as needed for cough. (Patient not taking: Reported on 09/08/2019) 120 mL 0  . potassium chloride SA (K-DUR,KLOR-CON) 20 MEQ tablet Take 1.5 tablets (30 mEq total) by mouth daily. (Patient not taking: Reported on 09/08/2019) 40 tablet 3  . venlafaxine XR (EFFEXOR-XR) 37.5 MG 24 hr capsule Take 37.5 mg by mouth daily with breakfast.     . vitamin B-12 (CYANOCOBALAMIN) 1000 MCG tablet Take 1,000 mcg by mouth daily.     No current facility-administered medications for this visit.     Facility-Administered Medications Ordered in Other Visits  Medication Dose Route Frequency Provider Last Rate Last Dose  . sodium chloride flush (NS) 0.9 % injection 10 mL  10 mL Intravenous PRN Cammie Sickle, MD        PHYSICAL EXAMINATION: ECOG PERFORMANCE STATUS: 1 - Symptomatic but completely ambulatory  BP (!) 147/85 (BP Location: Left Arm, Patient Position: Sitting)   Pulse 91   Temp (!) 97.5 F (36.4 C) (Tympanic)   Wt 96 lb 9.6 oz (43.8 kg)   BMI 18.25 kg/m   Filed Weights   09/09/19 1051  Weight: 96 lb 9.6 oz (43.8 kg)    Physical Exam  Constitutional: Claudia Chavez is oriented to person, place, and time.  Thin built Caucasian female patient.  Claudia Chavez is alone.   HENT:  Head: Normocephalic and atraumatic.  Mouth/Throat: Oropharynx is clear and moist. No oropharyngeal exudate.  Eyes: Pupils are equal, round, and reactive to light.  Neck: Normal range of motion. Neck supple.  Cardiovascular: Normal rate and regular rhythm.  Pulmonary/Chest: No respiratory distress. Claudia Chavez has no wheezes.  Decreased air entry bilaterally.  Abdominal: Soft. Bowel sounds are normal. Claudia Chavez exhibits no distension and no mass. There is no abdominal tenderness. There is no rebound and no guarding.  Musculoskeletal: Normal range of motion.        General: No tenderness or edema.  Neurological: Claudia Chavez is alert and oriented to person, place, and time.  Skin: Skin is warm.  Psychiatric: Affect normal.   LABORATORY DATA:  I have reviewed the data as listed    Component Value Date/Time   NA 132 (L) 09/09/2019 1042   NA 137 07/30/2018 1245   K 3.8 09/09/2019 1042   CL 98 09/09/2019 1042   CO2 22 09/09/2019 1042   GLUCOSE 111 (H) 09/09/2019 1042   BUN 9 09/09/2019 1042   BUN 11 07/30/2018 1245   CREATININE 0.52 09/09/2019 1042   CALCIUM 8.7 (L) 09/09/2019 1042   PROT 7.6 09/09/2019 1042   ALBUMIN 3.1 (L) 09/09/2019 1042   AST 17 09/09/2019 1042   ALT 10 09/09/2019 1042   ALKPHOS 176 (H)  09/09/2019 1042   BILITOT 0.4 09/09/2019 1042   GFRNONAA >60 09/09/2019 1042   GFRAA >60 09/09/2019 1042    No results found for: SPEP, UPEP  Lab Results  Component Value Date   WBC 7.8 09/09/2019   NEUTROABS 5.6 09/09/2019   HGB 10.5 (L) 09/09/2019   HCT 33.4 (L) 09/09/2019   MCV 91.3 09/09/2019   PLT 470 (H) 09/09/2019      Chemistry      Component Value Date/Time   NA 132 (L) 09/09/2019 1042   NA 137 07/30/2018 1245   K 3.8 09/09/2019 1042   CL 98 09/09/2019 1042   CO2 22 09/09/2019 1042   BUN 9 09/09/2019 1042   BUN 11 07/30/2018  1245   CREATININE 0.52 09/09/2019 1042      Component Value Date/Time   CALCIUM 8.7 (L) 09/09/2019 1042   ALKPHOS 176 (H) 09/09/2019 1042   AST 17 09/09/2019 1042   ALT 10 09/09/2019 1042   BILITOT 0.4 09/09/2019 1042       RADIOGRAPHIC STUDIES: I have personally reviewed the radiological images as listed and agreed with the findings in the report. No results found.   ASSESSMENT & PLAN:  Malignant neoplasm of right upper lobe of lung ([place]) # Right upper lobe stage I lung cancer; s/p feb February 2019 SBRT; less likely recurrence.  CT scan-August 31st-stable [see below]  # Left lung stage III lung cancer-clinically stable./See below  #Bi-Lateral MAC infection- on ethambutol [~9-12 months as per Dr.Ravishankar].  CT scan June 02, 2019-stable cavitary lesions x2 right upper lobe; bilateral subcentimeter lung nodules-stable; stable mediastinal adenopathy.  Patient has appointment with ID in December 2020.  # Anemia-likely secondary to chronic inflammation-hemoglobin 10.5- STABLE.   # COPD- stable; on duonebs; will order portable nebulizer [if not approved; defer to PCP]  # DISPOSITION: portable nebulizer.  # Follow up in 3 months- MD- cbc/Cmp/port flush; CT chest - Dr.B  Cc: Drs. Kasa/Ravikrishnan    Orders Placed This Encounter  Procedures  . CT Chest W Contrast    Standing Status:   Future    Standing Expiration Date:    09/08/2020    Order Specific Question:   If indicated for the ordered procedure, I authorize the administration of contrast media per Radiology protocol    Answer:   Yes    Order Specific Question:   Preferred imaging location?    Answer:   [facility]    Order Specific Question:   Radiology Contrast Protocol - do NOT remove file path    Answer:   [email or web address]    Order Specific Question:   ** REASON FOR EXAM (FREE TEXT)    Answer:   lung cancer; MAC infection   All questions were answered. The patient knows to call the clinic with any problems, questions or concerns.      Cammie Sickle, MD 09/09/2019 5:13 PM

## 2019-09-09 NOTE — Assessment & Plan Note (Addendum)
#   Right upper lobe stage I lung cancer; s/p feb February 2019 SBRT; less likely recurrence.  CT scan-August 31st-stable [see below]  # Left lung stage III lung cancer-clinically stable./See below  #Bi-Lateral MAC infection- on ethambutol [~9-12 months as per Dr.Ravishankar].  CT scan June 02, 2019-stable cavitary lesions x2 right upper lobe; bilateral subcentimeter lung nodules-stable; stable mediastinal adenopathy.  Patient has appointment with ID in December 2020.  # Anemia-likely secondary to chronic inflammation-hemoglobin 10.5- STABLE.   # COPD- stable; on duonebs; will order portable nebulizer [if not approved; defer to PCP]  # DISPOSITION: portable nebulizer.  # Follow up in 3 months- MD- cbc/Cmp/port flush; CT chest - Dr.B  Cc: Drs. Kasa/Ravikrishnan

## 2019-09-16 ENCOUNTER — Other Ambulatory Visit: Payer: Self-pay

## 2019-09-16 ENCOUNTER — Encounter: Payer: Self-pay | Admitting: Infectious Diseases

## 2019-09-16 ENCOUNTER — Ambulatory Visit: Payer: PPO | Attending: Infectious Diseases | Admitting: Infectious Diseases

## 2019-09-16 VITALS — BP 122/82 | HR 103 | Temp 98.4°F | Resp 17 | Ht 61.0 in | Wt 99.0 lb

## 2019-09-16 DIAGNOSIS — Z90710 Acquired absence of both cervix and uterus: Secondary | ICD-10-CM | POA: Diagnosis not present

## 2019-09-16 DIAGNOSIS — Z888 Allergy status to other drugs, medicaments and biological substances status: Secondary | ICD-10-CM | POA: Insufficient documentation

## 2019-09-16 DIAGNOSIS — Z87891 Personal history of nicotine dependence: Secondary | ICD-10-CM | POA: Insufficient documentation

## 2019-09-16 DIAGNOSIS — Z7982 Long term (current) use of aspirin: Secondary | ICD-10-CM | POA: Insufficient documentation

## 2019-09-16 DIAGNOSIS — Z85118 Personal history of other malignant neoplasm of bronchus and lung: Secondary | ICD-10-CM | POA: Diagnosis not present

## 2019-09-16 DIAGNOSIS — Z801 Family history of malignant neoplasm of trachea, bronchus and lung: Secondary | ICD-10-CM | POA: Insufficient documentation

## 2019-09-16 DIAGNOSIS — Z79899 Other long term (current) drug therapy: Secondary | ICD-10-CM | POA: Insufficient documentation

## 2019-09-16 DIAGNOSIS — Z792 Long term (current) use of antibiotics: Secondary | ICD-10-CM | POA: Diagnosis not present

## 2019-09-16 DIAGNOSIS — J449 Chronic obstructive pulmonary disease, unspecified: Secondary | ICD-10-CM | POA: Diagnosis not present

## 2019-09-16 DIAGNOSIS — Z08 Encounter for follow-up examination after completed treatment for malignant neoplasm: Secondary | ICD-10-CM | POA: Insufficient documentation

## 2019-09-16 DIAGNOSIS — A310 Pulmonary mycobacterial infection: Secondary | ICD-10-CM

## 2019-09-16 MED ORDER — ETHAMBUTOL HCL 400 MG PO TABS: 15.0000 mg/kg | ORAL_TABLET | Freq: Every day | ORAL | 3 refills | Status: DC

## 2019-09-16 MED ORDER — AZITHROMYCIN 500 MG PO TABS: 500.0000 mg | ORAL_TABLET | Freq: Every day | ORAL | 3 refills | Status: DC

## 2019-09-16 MED ORDER — RIFABUTIN 150 MG PO CAPS: 300.0000 mg | ORAL_CAPSULE | Freq: Every day | ORAL | 3 refills | Status: DC

## 2019-09-16 NOTE — Patient Instructions (Addendum)
You have been taking these meds infrequently because of the size of the pills and difficulty swallowing- would recommend spreading them out over a 3 hour period- continue the antibiotics until your next CT scan and we will decide after that.

## 2019-09-16 NOTE — Progress Notes (Signed)
NAME: Claudia Chavez  DOB: [birth year]  MRN: [id number]  Date/Time: 09/16/2019 9:15 AM  Dr.Kasa Maretta Bees Subjective:  follow up visit for pulmonary MAC infection Started treatment 11/11/18 Last visit on 06/17/19 Pt here to follow up on Pulmonary MAC infection She was taking her meds until 06/17/19 visit and then following that she has been missing doses for many reason-pills are too big, bad taste when she crushes them , mainly tired of taking them  She is having trouble swallowing the pills but not food.   Currently on azithromycin 500 mg once a day, ethambutol 36m/kg body weight (6056m and rifabutin  300 mg a day.    She is no longer anemic and has not needed any blood transfusion in the last 5 months.  She has no nausea or vomiting or diarrhea.  No rash.  Appetite is improved a lot.  She has more energy now. CAT scan of the chest done on 06/02/2019  showed no change in the examination.  There was no new or progressive interval changes.  The thick-walled cavitary lesions in the right lung were similar in size.  She saw Dr. BrBurlene Arnther oncologist on 09/09/19   -repeat CT scan planned for MArch  Medical history She is a [age]ear old female who has a  history of Lung cancer , COPD Patient was diagnosed non-small cell right lung cancer in July 2016 and underwent 2 cycles of chemotherapy with Taxol and carboplatin  .   She developed  neuropathy.  And chemo was stopped.  She had [facility] lower [facility]  [facility]. OaFaith Rogue This was followed by radiation therapy to the right lung in February 2017 for 30 days.  In May 2017 a repeat CAT scan was noted as interval right lower lobe resection without evidence of local recurrence or definite metastatic disease. IShe was followed by oncology with serial CAT scans and PET scans. PET scan Feb 9th 2018- no evidence of recurrence; postoperative changes/stable right hilar lymph node without any activity noted. On 05/25/17 CT showed partial  cavitary right upper lobe pulmonary nodule, most consistent with metastatic disease versus metachronous primary.  She was seen by pulmonologist Dr.Casa and underwent bronch and biopsy on 10/25/17.  Pathology report was VERY SCANT LUNG TISSUE WITH PIGMENTED MACROPHAGES. - NO NEOPLASM IS IDENTIFIED IN THIS SPECIMEN She received  SBRT  to the right lung X 5 doses-   Subsequently she was complaining of cough and productive sputum and she was treated with multiple courses of antibiotics including Levaquin.  A repeat CT scan was done in  Jan 2020 and it showed Thick-walled, partially cavitary process in the right lung apex measuring 5.4 x 7.4 cm (series 3/image 34), progressive, previously 4.2 x 2.9 cm.  Additional thick-walled cavitary process posteriorly in the right upper lobe measuring 8.1 x 5.5 cm (series 3/image 61), previously 7.7 x 5.1 cm when measured in a similar fashion.      On 10/10/2018 she underwent bronchoscopy and bronchoalveolar lavage was sent for AFB and fungal and bacterial cultures.  The pathology showed BENIGN ALVEOLATED LUNG PARENCHYMA AND BRONCHIAL WALL WITH FOCAL  ORGANIZING FIBRIN. - NO EVIDENCE OF TUMOR OR GRANULOMA.    AFB and GMS special stains were negative. The AFB nucleic acid amplification testing was done and that was positive for Mycobacterium avium intracellulare and she was referred to me in February 2020.. Marland Kitchen  She was a smoker until December 2019.  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 Husband died in 12-05-2016 of lung cancer as well.  Past Medical History:  Diagnosis Date  . Cancer of lower lobe of right lung ([place]) 05/07/2015  . COPD (chronic obstructive pulmonary disease) ([place])   . Dyspnea   . Non-small cell lung cancer ([place])   . Pneumonia 04/2015  . Pulmonary Mycobacterium avium complex (MAC) infection ([place]) 10/25/2018    Past Surgical History:  Procedure Laterality Date  . ABDOMINAL HYSTERECTOMY    . COLONOSCOPY WITH PROPOFOL N/A  06/09/2018   Procedure: COLONOSCOPY WITH PROPOFOL;  Surgeon: Toledo, Benay Pike, MD;  Location: [facility] ENDOSCOPY;  Service: Gastroenterology;  Laterality: N/A;  . ELBOW SURGERY Right 1995  . ELECTROMAGNETIC NAVIGATION BROCHOSCOPY N/A 10/25/2017   Procedure: ELECTROMAGNETIC NAVIGATION BRONCHOSCOPY;  Surgeon: [name], MD;  Location: [facility] ORS;  Service: Cardiopulmonary;  Laterality: N/A;  . ESOPHAGOGASTRODUODENOSCOPY N/A 06/08/2018   Procedure: ESOPHAGOGASTRODUODENOSCOPY (EGD);  Surgeon: Toledo, Benay Pike, MD;  Location: [facility] ENDOSCOPY;  Service: Gastroenterology;  Laterality: N/A;  . FLEXIBLE BRONCHOSCOPY Right 10/10/2018   Procedure: FLEXIBLE BRONCHOSCOPY;  Surgeon: [name], MD;  Location: [facility] ORS;  Service: Cardiopulmonary;  Laterality: Right;  . PORTACATH PLACEMENT Right 05/10/2015   Procedure: INSERTION PORT-A-CATH;  Surgeon: Nestor Lewandowsky, MD;  Location: [facility] ORS;  Service: General;  Laterality: Right;  Marland Kitchen VIDEO ASSISTED THORACOSCOPY (VATS)/THOROCOTOMY Right 08/18/2015   Procedure: VIDEO ASSISTED THORACOSCOPY (VATS)/THOROCOTOMY;  Surgeon: Nestor Lewandowsky, MD;  Location: [facility] ORS;  Service: General;  Laterality: Right;    Social History   Socioeconomic History  . Marital status: Widowed    Spouse name: Not on file  . Number of children: Not on file  . Years of education: Not on file  . Highest education level: Not on file  Occupational History  . Not on file  Tobacco Use  . Smoking status: Former Smoker    Packs/day: 0.50    Years: 40.00    Pack years: 20.00    Types: Cigarettes    Quit date: 09/12/2018    Years since quitting: 1.0  . Smokeless tobacco: Never Used  Substance and Sexual Activity  . Alcohol use: Not Currently    Alcohol/week: 2.0 - 4.0 standard drinks    Types: 2 - 4 Cans of beer per week    Comment: beer-occasionally  . Drug use: No  . Sexual activity: Never  Other Topics Concern  . Not on file  Social History Narrative  . Not on file   Social Determinants of  Health   Financial Resource Strain:   . Difficulty of Paying Living Expenses: Not on file  Food Insecurity:   . Worried About Charity fundraiser in the Last Year: Not on file  . Ran Out of Food in the Last Year: Not on file  Transportation Needs:   . Lack of Transportation (Medical): Not on file  . Lack of Transportation (Non-Medical): Not on file  Physical Activity:   . Days of Exercise per Week: Not on file  . Minutes of Exercise per Session: Not on file  Stress:   . Feeling of Stress : Not on file  Social Connections:   . Frequency of Communication with Friends and Family: Not on file  . Frequency of Social Gatherings with Friends and Family: Not on file  . Attends Religious Services: Not on file  . Active Member of Clubs or Organizations: Not on file  . Attends Archivist Meetings: Not on file  . Marital Status: Not  on file  Intimate Partner Violence:   . Fear of Current or Ex-Partner: Not on file  . Emotionally Abused: Not on file  . Physically Abused: Not on file  . Sexually Abused: Not on file    Family History  Problem Relation Age of Onset  . Stroke Mother   . Lung cancer Father 41   Allergies  Allergen Reactions  . Keppra [Levetiracetam] Other (See Comments)    Nausea and dizzy  . Lyrica [Pregabalin] Other (See Comments)    Made patient feel very dizzy and not feel good.    Current medications  ? Current Outpatient Medications  Medication Sig Dispense Refill  . albuterol (PROVENTIL HFA;VENTOLIN HFA) 108 (90 Base) MCG/ACT inhaler Inhale 2 puffs into the lungs every 6 (six) hours as needed for wheezing or shortness of breath.     Marland Kitchen aspirin EC 81 MG tablet Take 81 mg by mouth daily.    Marland Kitchen azithromycin (ZITHROMAX) 500 MG tablet Take 1 tablet (500 mg total) by mouth daily. 30 tablet 4  . benzonatate (TESSALON PERLES) 100 MG capsule Take 1 capsule (100 mg total) by mouth 3 (three) times daily as needed for cough. 90 capsule 3  . Cholecalciferol (VITAMIN  D3) 1000 units CAPS Take 2,000 Units by mouth daily.     Marland Kitchen ethambutol (MYAMBUTOL) 400 MG tablet Take 1.5 tablets (600 mg total) by mouth daily. 45 tablet 4  . Fluticasone-Salmeterol (ADVAIR DISKUS) 250-50 MCG/DOSE AEPB Inhale 1 puff into the lungs every 12 (twelve) hours.     Marland Kitchen guaiFENesin (MUCINEX) 600 MG 12 hr tablet Take 1 tablet (600 mg total) by mouth 2 (two) times daily. 30 tablet 0  . ipratropium-albuterol (DUONEB) 0.5-2.5 (3) MG/3ML SOLN Take 3 mLs by nebulization every 4 (four) hours as needed. 360 mL 3  . Respiratory Therapy Supplies (FLUTTER) DEVI 1 each by Does not apply route daily. 1 each 0  . rifabutin (MYCOBUTIN) 150 MG capsule Take 2 capsules (300 mg total) by mouth daily. 60 capsule 4  . venlafaxine XR (EFFEXOR-XR) 37.5 MG 24 hr capsule Take 37.5 mg by mouth daily with breakfast.     . vitamin B-12 (CYANOCOBALAMIN) 1000 MCG tablet Take 1,000 mcg by mouth daily.     No current facility-administered medications for this visit.   Facility-Administered Medications Ordered in Other Visits  Medication Dose Route Frequency Provider Last Rate Last Admin  . sodium chloride flush (NS) 0.9 % injection 10 mL  10 mL Intravenous PRN Cammie Sickle, MD         Abtx:  Anti-infectives (From admission, onward)   None      REVIEW OF SYSTEMS:  Const: negative fever, negative chills, steady weight of 99 pounds Eyes: negative diplopia or visual changes, negative eye pain ENT: negative coryza, negative sore throat Resp: Positive cough, productive at times no hemoptysis, no dyspnea Cards: negative for chest pain, palpitations, lower extremity edema GU: negative for frequency, dysuria and hematuria GI: Negative for abdominal pain, diarrhea, bleeding, constipation Skin: negative for rash and pruritus Heme: negative for easy bruising and gum/nose bleeding MS: No fatigue or  weakness Neurolo:negative for headaches, has dizziness, vertigo, memory problems  Psych:  anxiety, depression    Endocrine: No polyuria or polydipsia Allergy/Immunology-allergy to Keppra and Lyrica.  Has side effects like dizziness and nausea Objective:  VITALS:  BP 122/82   Pulse (!) 103   Temp 98.4 F (36.9 C) (Oral)   Resp 17   Ht _0  (1.549 m)  Wt 99 lb (44.9 kg)   SpO2 97%   BMI 18.71 kg/m    PHYSICAL EXAM:  General: Alert, cooperative, no distress, appears stated age.  ,thin built Head: Normocephalic, without obvious abnormality, atraumatic. Eyes: Conjunctivae clear, anicteric sclerae. Pupils are equal ENT Nares normal. No drainage or sinus tenderness. Lips, mucosa, and tongue normal. No Thrush Neck: Supple, symmetrical, no adenopathy, thyroid: non tender no carotid bruit and no JVD. Back: No CVA tenderness. Lungs: Bilateral air entry crepitations present on the right side Heart: S1-S2 Abdomen: Soft, non-tender,not distended. Bowel sounds normal. No masses Extremities: atraumatic, no cyanosis. No edema. No clubbing Skin: No rashes or lesions. Or bruising Lymph: Cervical, supraclavicular normal. Neurologic: Grossly non-focal Pertinent Labs Lab Results CBC    Component Value Date/Time   WBC 7.8 09/09/2019 1042   RBC 3.66 (L) 09/09/2019 1042   HGB 10.5 (L) 09/09/2019 1042   HCT 33.4 (L) 09/09/2019 1042   PLT 470 (H) 09/09/2019 1042   MCV 91.3 09/09/2019 1042   MCH 28.7 09/09/2019 1042   MCHC 31.4 09/09/2019 1042   RDW 16.0 (H) 09/09/2019 1042   LYMPHSABS 1.2 09/09/2019 1042   MONOABS 0.7 09/09/2019 1042   EOSABS 0.2 09/09/2019 1042   BASOSABS 0.1 09/09/2019 1042    CMP Latest Ref Rng & Units 09/09/2019 06/10/2019 06/02/2019  Glucose 70 - 99 mg/dL 111(H) 124(H) -  BUN 8 - 23 mg/dL 9 14 -  Creatinine 0.44 - 1.00 mg/dL 0.52 0.58 0.60  Sodium 135 - 145 mmol/L 132(L) 133(L) -  Potassium 3.5 - 5.1 mmol/L 3.8 3.7 -  Chloride 98 - 111 mmol/L 98 100 -  CO2 22 - 32 mmol/L 22 23 -  Calcium 8.9 - 10.3 mg/dL 8.7(L) 9.3 -  Total Protein 6.5 - 8.1 g/dL 7.6 7.9 -  Total  Bilirubin 0.3 - 1.2 mg/dL 0.4 0.4 -  Alkaline Phos 38 - 126 U/L 176(H) 170(H) -  AST 15 - 41 U/L 17 21 -  ALT 0 - 44 U/L 10 15 -      Microbiology: 10/10/2018 acid-fast organism ID by nucleic acid amplification test is M avium complex.       Impression/Recommendation 63 y.o.female  with a history of Lung cancer , COPD   Fibrocavitary  lesions in the right lung.  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   She is on 3 drug combination. On  azithromycin  500 mg once a day, ethambutol 42m/kg body weight (601m and rifabutin  300 mg a day since February 2020.. Marland Kitchen  She was 100% adherent until a few weeks ago. Three sputums repeated inSeptember still has MAC . Susceptibility pending-  Pt is failing therapy - is this because of poor adherence or is she refractory to current therapy Will await susceptibilities may need to change  Medication if resistant to current regimen- depending on that ( ? Clofazimine, bedaquiline ALIS)   Anemia which had necessitated blood transfusion in the past has been stable and hemoglobin is improved and is currently 11.3.  Right lung carcinoma.  Status post right lower lobectomy, radiation, recurrence, SBRT.  Followed by Dr. BrYevette Edwardsnd Dr. KaMortimer FriesA repeat CAT scan is being planned iin March 2021  COPD on inhalers and nebulizers Smoker: Quit smoking December 2019  Will talk to her after I get the results back  ___________________________________________________ Discussed the management in great detail with the patient.  50% of the time was spent on counseling her on  adherence

## 2019-09-17 NOTE — Addendum Note (Signed)
Addended by: Tsosie Billing on: 09/17/2019 10:42 AM   Modules accepted: Level of Service

## 2019-09-29 MED FILL — ETHAMBUTOL HCL 400 MG TAB: 400 | 30 days supply | Qty: 45 | Fill #3

## 2019-09-29 MED FILL — RIFABUTIN 150 MG CAPSULE: 150 | 30 days supply | Qty: 60 | Fill #3

## 2019-10-30 ENCOUNTER — Other Ambulatory Visit: Payer: Self-pay

## 2019-10-30 ENCOUNTER — Encounter: Payer: Self-pay | Admitting: Radiation Oncology

## 2019-10-30 NOTE — Progress Notes (Signed)
Patient pre screened for office appointment, no questions or concerns today. Patient reminded of upcoming appointment time and date. 

## 2019-10-31 ENCOUNTER — Ambulatory Visit
Admission: RE | Admit: 2019-10-31 | Discharge: 2019-10-31 | Disposition: A | Discharge status: Home / Self Care | Payer: PPO | Source: Ambulatory Visit | Attending: Radiation Oncology | Admitting: Radiation Oncology

## 2019-10-31 ENCOUNTER — Other Ambulatory Visit: Payer: Self-pay

## 2019-10-31 VITALS — BP 159/93 | HR 106 | Temp 96.9°F | Resp 16 | Wt 93.5 lb

## 2019-10-31 DIAGNOSIS — Z923 Personal history of irradiation: Secondary | ICD-10-CM | POA: Insufficient documentation

## 2019-10-31 DIAGNOSIS — C3431 Malignant neoplasm of lower lobe, right bronchus or lung: Secondary | ICD-10-CM

## 2019-10-31 DIAGNOSIS — Z85118 Personal history of other malignant neoplasm of bronchus and lung: Secondary | ICD-10-CM | POA: Insufficient documentation

## 2019-10-31 DIAGNOSIS — F17210 Nicotine dependence, cigarettes, uncomplicated: Secondary | ICD-10-CM | POA: Insufficient documentation

## 2019-10-31 DIAGNOSIS — C7801 Secondary malignant neoplasm of right lung: Secondary | ICD-10-CM | POA: Diagnosis not present

## 2019-10-31 NOTE — Progress Notes (Signed)
Radiation Oncology Follow up Note  Name: Claudia Chavez   Date:   10/31/2019 MRN:  [id number] DOB: 1956-01-31    This 64 y.o. female presents to the clinic today for 2-year follow-up status post SBRT to her right upper lobe.  As well as concurrent chemoradiation therapy for stage IIIa carcinoma the right lower lobe  REFERRING PROVIDER: Maryland Pink, MD  HPI: Patient is a 64 year old female now out 2 years having completed SBRT to her right upper lobe for stage I non-small cell lung cancer.  She is also concurrent chemoradiation for stage IIIa non-small cell lung cancer of the right lower lobe.  She is seen today in routine follow-up is doing well specifically denies cough hemoptysis or chest tightness.  Her most recent CT scan back in August which I have reviewed shows no substantial change on the exam no new or progressive interval findings.  Right lung mass are similar in size today.  She also has a follow-up CT scan in March.  She is currently not under therapy.  COMPLICATIONS OF TREATMENT: none  FOLLOW UP COMPLIANCE: keeps appointments   PHYSICAL EXAM:  BP (!) 159/93   Pulse (!) 106   Temp (!) 96.9 F (36.1 C)   Resp 16   Wt 93 lb 8 oz (42.4 kg)   SpO2 100%   BMI 17.67 kg/m  Well-developed well-nourished patient in NAD. HEENT reveals PERLA, EOMI, discs not visualized.  Oral cavity is clear. No oral mucosal lesions are identified. Neck is clear without evidence of cervical or supraclavicular adenopathy. Lungs are clear to A&P. Cardiac examination is essentially unremarkable with regular rate and rhythm without murmur rub or thrill. Abdomen is benign with no organomegaly or masses noted. Motor sensory and DTR levels are equal and symmetric in the upper and lower extremities. Cranial nerves II through XII are grossly intact. Proprioception is intact. No peripheral adenopathy or edema is identified. No motor or sensory levels are noted. Crude visual fields are within normal  range.  RADIOLOGY RESULTS: Serial CT scans reviewed compatible with above-stated findings  PLAN: Present time patient is doing well with no evidence of disease.  I will review her CT scan that she will have in March.  She continues close follow-up care with Dr. Jacinto Reap.  I have asked to see her back in a whole year for follow-up.  Patient knows to call sooner with any concerns.  I would like to take this opportunity to thank you for allowing me to participate in the care of your patient.Noreene Filbert, MD

## 2019-11-03 ENCOUNTER — Other Ambulatory Visit: Payer: Self-pay | Admitting: Internal Medicine

## 2019-11-10 ENCOUNTER — Ambulatory Visit: Payer: PPO | Admitting: Family

## 2019-12-08 ENCOUNTER — Ambulatory Visit
Admission: RE | Admit: 2019-12-08 | Discharge: 2019-12-08 | Disposition: A | Discharge status: Home / Self Care | Payer: PPO | Source: Ambulatory Visit | Attending: Internal Medicine | Admitting: Internal Medicine

## 2019-12-08 ENCOUNTER — Other Ambulatory Visit: Payer: Self-pay

## 2019-12-08 DIAGNOSIS — C3411 Malignant neoplasm of upper lobe, right bronchus or lung: Secondary | ICD-10-CM | POA: Diagnosis not present

## 2019-12-08 LAB — POCT I-STAT CREATININE: Creatinine, Ser: 0.5 mg/dL (ref 0.44–1.00)

## 2019-12-08 MED ORDER — IOHEXOL 300 MG/ML  SOLN
60.0000 mL | Freq: Once | INTRAMUSCULAR | Status: AC | PRN
  Administered 2019-12-08: 13:00:00 60 mL via INTRAVENOUS

## 2019-12-09 ENCOUNTER — Other Ambulatory Visit: Payer: Self-pay | Admitting: *Deleted

## 2019-12-09 ENCOUNTER — Inpatient Hospital Stay: Payer: PPO | Attending: Internal Medicine

## 2019-12-09 ENCOUNTER — Other Ambulatory Visit: Payer: Self-pay

## 2019-12-09 ENCOUNTER — Inpatient Hospital Stay (HOSPITAL_BASED_OUTPATIENT_CLINIC_OR_DEPARTMENT_OTHER): Payer: PPO | Admitting: Internal Medicine

## 2019-12-09 DIAGNOSIS — R0609 Other forms of dyspnea: Secondary | ICD-10-CM | POA: Insufficient documentation

## 2019-12-09 DIAGNOSIS — A310 Pulmonary mycobacterial infection: Secondary | ICD-10-CM | POA: Insufficient documentation

## 2019-12-09 DIAGNOSIS — R05 Cough: Secondary | ICD-10-CM | POA: Insufficient documentation

## 2019-12-09 DIAGNOSIS — J449 Chronic obstructive pulmonary disease, unspecified: Secondary | ICD-10-CM | POA: Diagnosis not present

## 2019-12-09 DIAGNOSIS — Z87891 Personal history of nicotine dependence: Secondary | ICD-10-CM | POA: Diagnosis not present

## 2019-12-09 DIAGNOSIS — C3411 Malignant neoplasm of upper lobe, right bronchus or lung: Secondary | ICD-10-CM

## 2019-12-09 DIAGNOSIS — D649 Anemia, unspecified: Secondary | ICD-10-CM | POA: Diagnosis not present

## 2019-12-09 DIAGNOSIS — C3431 Malignant neoplasm of lower lobe, right bronchus or lung: Secondary | ICD-10-CM | POA: Insufficient documentation

## 2019-12-09 DIAGNOSIS — Z95828 Presence of other vascular implants and grafts: Secondary | ICD-10-CM

## 2019-12-09 LAB — COMPREHENSIVE METABOLIC PANEL
ALT: 13 U/L (ref 0–44)
AST: 16 U/L (ref 15–41)
Albumin: 3.4 g/dL — ABNORMAL LOW (ref 3.5–5.0)
Alkaline Phosphatase: 138 U/L — ABNORMAL HIGH (ref 38–126)
Anion gap: 9 (ref 5–15)
BUN: 9 mg/dL (ref 8–23)
CO2: 22 mmol/L (ref 22–32)
Calcium: 8.6 mg/dL — ABNORMAL LOW (ref 8.9–10.3)
Chloride: 98 mmol/L (ref 98–111)
Creatinine, Ser: 0.42 mg/dL — ABNORMAL LOW (ref 0.44–1.00)
GFR calc Af Amer: >60 mL/min (ref >60)
GFR calc non Af Amer: >60 mL/min (ref >60)
Glucose, Bld: 102 mg/dL — ABNORMAL HIGH (ref 70–99)
Potassium: 3.6 mmol/L (ref 3.5–5.1)
Sodium: 129 mmol/L — ABNORMAL LOW (ref 135–145)
Total Bilirubin: 0.5 mg/dL (ref 0.3–1.2)
Total Protein: 7.6 g/dL (ref 6.5–8.1)

## 2019-12-09 LAB — CBC WITH DIFFERENTIAL/PLATELET
Abs Immature Granulocytes: 0.03 10*3/uL (ref 0.00–0.07)
Basophils Absolute: 0.1 10*3/uL (ref 0.0–0.1)
Basophils Relative: 1 %
Eosinophils Absolute: 0.2 10*3/uL (ref 0.0–0.5)
Eosinophils Relative: 3 %
HCT: 32.3 % — ABNORMAL LOW (ref 36.0–46.0)
Hemoglobin: 10.3 g/dL — ABNORMAL LOW (ref 12.0–15.0)
Immature Granulocytes: 0 %
Lymphocytes Relative: 13 %
Lymphs Abs: 1.2 10*3/uL (ref 0.7–4.0)
MCH: 29.3 pg (ref 26.0–34.0)
MCHC: 31.9 g/dL (ref 30.0–36.0)
MCV: 92 fL (ref 80.0–100.0)
Monocytes Absolute: 0.6 10*3/uL (ref 0.1–1.0)
Monocytes Relative: 7 %
Neutro Abs: 6.6 10*3/uL (ref 1.7–7.7)
Neutrophils Relative %: 76 %
Platelets: 509 10*3/uL — ABNORMAL HIGH (ref 150–400)
RBC: 3.51 MIL/uL — ABNORMAL LOW (ref 3.87–5.11)
RDW: 16.6 % — ABNORMAL HIGH (ref 11.5–15.5)
WBC: 8.8 10*3/uL (ref 4.0–10.5)
nRBC: 0 % (ref 0.0–0.2)

## 2019-12-09 LAB — LACTATE DEHYDROGENASE: LDH: 112 U/L (ref 98–192)

## 2019-12-09 MED ORDER — SODIUM CHLORIDE 0.9% FLUSH
10.0000 mL | Freq: Once | INTRAVENOUS | Status: AC
  Administered 2019-12-09: 11:00:00 10 mL via INTRAVENOUS
  Filled 2019-12-09: qty 10

## 2019-12-09 MED ORDER — HEPARIN SOD (PORK) LOCK FLUSH 100 UNIT/ML IV SOLN
500.0000 [IU] | Freq: Once | INTRAVENOUS | Status: AC
  Administered 2019-12-09: 500 [IU] via INTRAVENOUS
  Filled 2019-12-09: qty 5

## 2019-12-09 NOTE — Progress Notes (Signed)
[place] OFFICE PROGRESS NOTE  Patient Care Team: [place] Pink, MD as PCP - General (Family Medicine) Cammie Sickle, MD as Medical Oncologist (Medical Oncology) Rockey Situ, Kathlene November, MD as Consulting Physician (Cardiology) Efrain Sella, MD as Consulting Physician (Gastroenterology) Noreene Filbert, MD as Referring Physician (Radiation Oncology)  Cancer Staging Malignant neoplasm of lung [facility]) Staging form: Lung, AJCC 7th Edition - Clinical: Stage IIIA (T3, N2, M0) - Signed by Forest Gleason, MD on 05/07/2015    Oncology History Overview Note  # Squamous cell carcinoma of right LOWER LOBE OF lung stage IIIA based on PET scan Biopsy of the (August of 2016). 2. After 3 cycles of chemotherapy patient underwent resection of lung mass (November, 2016) ypT2B ypN0 [Dr.Oaks] status post right lower lobectomy with a 3. She was started on carboplatinum and Taxol on a weekly basis and radiation therapy from January of 2017 4. After 2 cycles of carboplatinum patient had neuropathy grade 2 interfering with work so chemotherapy was put on hold and radiation was continued (January 19th, 2017) 5. Chemotherapy was discontinued because of progressive neuropathy. Patient is finishing of radiation therapy on November 19, 2015  # NOV 2017- CT- 97m right hilar LN; PET- Feb 2018- NED.   # DEC-JAN 2019- RUL Lung nodule [s/p Bronch; Dr.Kasa- non-diagnostic] SBRT Feb 2019  #October 10, 2018 bronchoscopy [Dr.Kasa]-negative for malignancy/positive for MAC infection-ethambutol [Dr.Ravishankar]  # AUG 2019- R LE DVT [xarelto]x STOP in end of dec 2019. Syncope- MRI- 13 mm cystic leision/asymptomatic/[facility]; Bil subacute cerebellar stroke;  AUG 2019 - GIB/anemia [EGD/colo- Dr.Toledo; NEG]; Aug 2019-  Severe hypokalemia- sec to Florinef-resolved.   DIAGNOSIS: RLL stage III lung ca; RUL stage I   GOALS: curative  CURRENT/MOST RECENT THERAPY : Surveillaince     Malignant neoplasm of lung (HCC)  Malignant neoplasm of right upper lobe of lung (HCC)      INTERVAL HISTORY:  Claudia [id number].  female pleasant patient above history of stage III lung cancer; and also stage I right upper lobe status post radiation February 2019 is here for follow-up/review results of the restaging CAT scan.  Patient continues to be on treatment for her MAC infection ethambutol.  Patient has appointment with ID next month.  No fever no chills.  Appetite is good.  Complains of cough especially mornings but no hemoptysis.  Complains of shortness of breath with exertion.  Patient states to be using her Combivent nebulizer.   Review of Systems  Constitutional: Positive for malaise/fatigue. Negative for chills, diaphoresis, fever and weight loss.  HENT: Negative for nosebleeds and sore throat.   Eyes: Negative for double vision.  Respiratory: Positive for cough, sputum production and shortness of breath. Negative for hemoptysis and wheezing.   Cardiovascular: Negative for chest pain, palpitations, orthopnea and leg swelling.  Gastrointestinal: Negative for abdominal pain, blood in stool, constipation, diarrhea, heartburn, melena, nausea and vomiting.  Genitourinary: Negative for dysuria, frequency and urgency.  Musculoskeletal: Negative for back pain and joint pain.  Skin: Negative.  Negative for itching and rash.  Neurological: Negative for tingling, focal weakness, weakness and headaches.  Endo/Heme/Allergies: Does not bruise/bleed easily.  Psychiatric/Behavioral: Negative for depression. The patient is not nervous/anxious and does not have insomnia.       PAST MEDICAL HISTORY :  Past Medical History:  Diagnosis Date  . Cancer of lower lobe of right lung ([facility] 05/07/2015  . COPD (chronic obstructive pulmonary disease) ([facility]   . Dyspnea   . Non-small  cell lung cancer ([place])   . Pneumonia 04/2015  . Pulmonary Mycobacterium avium complex (MAC) infection ([place])  10/25/2018    PAST SURGICAL HISTORY :   Past Surgical History:  Procedure Laterality Date  . ABDOMINAL HYSTERECTOMY    . COLONOSCOPY WITH PROPOFOL N/A 06/09/2018   Procedure: COLONOSCOPY WITH PROPOFOL;  Surgeon: Toledo, Benay Pike, MD;  Location: [facility] ENDOSCOPY;  Service: Gastroenterology;  Laterality: N/A;  . ELBOW SURGERY Right 1995  . ELECTROMAGNETIC NAVIGATION BROCHOSCOPY N/A 10/25/2017   Procedure: ELECTROMAGNETIC NAVIGATION BRONCHOSCOPY;  Surgeon: [name], MD;  Location: [facility] ORS;  Service: Cardiopulmonary;  Laterality: N/A;  . ESOPHAGOGASTRODUODENOSCOPY N/A 06/08/2018   Procedure: ESOPHAGOGASTRODUODENOSCOPY (EGD);  Surgeon: Toledo, Benay Pike, MD;  Location: [facility] ENDOSCOPY;  Service: Gastroenterology;  Laterality: N/A;  . FLEXIBLE BRONCHOSCOPY Right 10/10/2018   Procedure: FLEXIBLE BRONCHOSCOPY;  Surgeon: [name], MD;  Location: [facility] ORS;  Service: Cardiopulmonary;  Laterality: Right;  . PORTACATH PLACEMENT Right 05/10/2015   Procedure: INSERTION PORT-A-CATH;  Surgeon: Nestor Lewandowsky, MD;  Location: [facility] ORS;  Service: General;  Laterality: Right;  Marland Kitchen VIDEO ASSISTED THORACOSCOPY (VATS)/THOROCOTOMY Right 08/18/2015   Procedure: VIDEO ASSISTED THORACOSCOPY (VATS)/THOROCOTOMY;  Surgeon: Nestor Lewandowsky, MD;  Location: [facility] ORS;  Service: General;  Laterality: Right;    FAMILY HISTORY :   Family History  Problem Relation Age of Onset  . Stroke Mother   . Lung cancer Father 28    SOCIAL HISTORY:   Social History   Tobacco Use  . Smoking status: Former Smoker    Packs/day: 0.50    Years: 40.00    Pack years: 20.00    Types: Cigarettes    Quit date: 09/12/2018    Years since quitting: 1.2  . Smokeless tobacco: Never Used  Substance Use Topics  . Alcohol use: Not Currently    Alcohol/week: 2.0 - 4.0 standard drinks    Types: 2 - 4 Cans of beer per week    Comment: beer-occasionally  . Drug use: No    ALLERGIES:  is allergic to keppra [levetiracetam] and lyrica  [pregabalin].  MEDICATIONS:  Current Outpatient Medications  Medication Sig Dispense Refill  . albuterol (PROVENTIL HFA;VENTOLIN HFA) 108 (90 Base) MCG/ACT inhaler Inhale 2 puffs into the lungs every 6 (six) hours as needed for wheezing or shortness of breath.     Marland Kitchen aspirin EC 81 MG tablet Take 81 mg by mouth daily.    . benzonatate (TESSALON) 100 MG capsule TAKE 1 CAPSULE BY MOUTH THREE TIMES A DAY AS NEEDED FOR COUGH 90 capsule 3  . Cholecalciferol (VITAMIN D3) 1000 units CAPS Take 2,000 Units by mouth daily.     Marland Kitchen ethambutol (MYAMBUTOL) 400 MG tablet Take 1.5 tablets (600 mg total) by mouth daily. 45 tablet 3  . Fluticasone-Salmeterol (ADVAIR DISKUS) 250-50 MCG/DOSE AEPB Inhale 1 puff into the lungs every 12 (twelve) hours.     Marland Kitchen guaiFENesin (MUCINEX) 600 MG 12 hr tablet Take 1 tablet (600 mg total) by mouth 2 (two) times daily. 30 tablet 0  . ipratropium-albuterol (DUONEB) 0.5-2.5 (3) MG/3ML SOLN Take 3 mLs by nebulization every 4 (four) hours as needed. 360 mL 3  . Respiratory Therapy Supplies (FLUTTER) DEVI 1 each by Does not apply route daily. 1 each 0  . rifabutin (MYCOBUTIN) 150 MG capsule Take 2 capsules (300 mg total) by mouth daily. 60 capsule 3  . SPIRIVA RESPIMAT 2.5 MCG/ACT AERS SMARTSIG:2 Inhalation Via Inhaler Daily    . vitamin B-12 (CYANOCOBALAMIN) 1000 MCG tablet Take 1,000 mcg  by mouth daily.     No current facility-administered medications for this visit.   Facility-Administered Medications Ordered in Other Visits  Medication Dose Route Frequency Provider Last Rate Last Admin  . sodium chloride flush (NS) 0.9 % injection 10 mL  10 mL Intravenous PRN Cammie Sickle, MD        PHYSICAL EXAMINATION: ECOG PERFORMANCE STATUS: 1 - Symptomatic but completely ambulatory  BP 139/84 (BP Location: Left Arm, Patient Position: Sitting)   Pulse 90   Temp (!) 97.3 F (36.3 C) (Tympanic)   Resp 20   Ht _0  (1.549 m)   Wt 94 lb (42.6 kg)   BMI 17.76 kg/m   Filed  Weights   12/09/19 1054  Weight: 94 lb (42.6 kg)    Physical Exam  Constitutional: She is oriented to person, place, and time.  Thin built Caucasian female patient.  She is alone.   HENT:  Head: Normocephalic and atraumatic.  Mouth/Throat: Oropharynx is clear and moist. No oropharyngeal exudate.  Eyes: Pupils are equal, round, and reactive to light.  Cardiovascular: Normal rate and regular rhythm.  Pulmonary/Chest: No respiratory distress. She has no wheezes.  Decreased air entry bilaterally.  Abdominal: Soft. Bowel sounds are normal. She exhibits no distension and no mass. There is no abdominal tenderness. There is no rebound and no guarding.  Musculoskeletal:        General: No tenderness or edema. Normal range of motion.     Cervical back: Normal range of motion and neck supple.  Neurological: She is alert and oriented to person, place, and time.  Skin: Skin is warm.  Psychiatric: Affect normal.   LABORATORY DATA:  I have reviewed the data as listed    Component Value Date/Time   NA 129 (L) 12/09/2019 1015   NA 137 07/30/2018 1245   K 3.6 12/09/2019 1015   CL 98 12/09/2019 1015   CO2 22 12/09/2019 1015   GLUCOSE 102 (H) 12/09/2019 1015   BUN 9 12/09/2019 1015   BUN 11 07/30/2018 1245   CREATININE 0.42 (L) 12/09/2019 1015   CALCIUM 8.6 (L) 12/09/2019 1015   PROT 7.6 12/09/2019 1015   ALBUMIN 3.4 (L) 12/09/2019 1015   AST 16 12/09/2019 1015   ALT 13 12/09/2019 1015   ALKPHOS 138 (H) 12/09/2019 1015   BILITOT 0.5 12/09/2019 1015   GFRNONAA >60 12/09/2019 1015   GFRAA >60 12/09/2019 1015    No results found for: SPEP, UPEP  Lab Results  Component Value Date   WBC 8.8 12/09/2019   NEUTROABS 6.6 12/09/2019   HGB 10.3 (L) 12/09/2019   HCT 32.3 (L) 12/09/2019   MCV 92.0 12/09/2019   PLT 509 (H) 12/09/2019      Chemistry      Component Value Date/Time   NA 129 (L) 12/09/2019 1015   NA 137 07/30/2018 1245   K 3.6 12/09/2019 1015   CL 98 12/09/2019 1015   CO2  22 12/09/2019 1015   BUN 9 12/09/2019 1015   BUN 11 07/30/2018 1245   CREATININE 0.42 (L) 12/09/2019 1015      Component Value Date/Time   CALCIUM 8.6 (L) 12/09/2019 1015   ALKPHOS 138 (H) 12/09/2019 1015   AST 16 12/09/2019 1015   ALT 13 12/09/2019 1015   BILITOT 0.5 12/09/2019 1015       RADIOGRAPHIC STUDIES: I have personally reviewed the radiological images as listed and agreed with the findings in the report. CT Chest W Contrast  Result  Date: 12/08/2019 CLINICAL DATA:  Right upper lobe lung cancer, status post SBRT in February 2019. Left lung cancer. Multifocal MAC infection. EXAM: CT CHEST WITH CONTRAST TECHNIQUE: Multidetector CT imaging of the chest was performed during intravenous contrast administration. CONTRAST:  39m OMNIPAQUE IOHEXOL 300 MG/ML  SOLN COMPARISON:  06/02/2019 FINDINGS: Cardiovascular: The heart is normal in size. No pericardial effusion. Rightward cardiomediastinal shift. No evidence of thoracic aortic aneurysm. Atherosclerotic calcifications of the aortic arch. Coronary atherosclerosis of the LAD. Right chest port terminates in the lower SVC. Mediastinum/Nodes: Small thoracic lymph nodes which do not meet pathologic CT size criteria, including a 6 mm short axis right supraclavicular node, a 6 mm short axis high right paratracheal node, and an 8 mm short axis low right paratracheal node. These are grossly unchanged. Visualized thyroid is unremarkable. Lungs/Pleura: Status post right lower lobectomy. Progressive thick-walled cavitary lesion in the right posterior pleural space, now measuring approximately 5.6 x 8.7 x 16.6 cm. Collapse of an additional thick-walled cavitary pleural base lesion in the right posterior lung base (series 3/image 127). Moderate to severe centrilobular and paraseptal emphysematous changes. Mild left apical pleural-parenchymal scarring. Numerous pulmonary nodules throughout the lungs bilaterally, stable versus mildly progressive from the most  recent CT. An index 13 mm lesion in the lingula (series 3/image 100) previously measured 12 mm. An index 10 mm lesion in the left lower lobe (series 3/image 85) is unchanged. Overall, this is waxing/waning. No pleural effusion or pneumothorax. Upper Abdomen: Visualized upper abdomen is unremarkable. Musculoskeletal: Visualized osseous structures are within normal limits. IMPRESSION: Status post right lower lobectomy. Progressive thick-walled cavitary lesion in the right posterior pleural space, as above. Numerous bilateral pulmonary nodules throughout the lungs bilaterally, stable versus mildly progressive. Overall, this is waxing/waning. Small mediastinal lymph nodes, as above, grossly unchanged. Aortic Atherosclerosis (ICD10-I70.0) and Emphysema (ICD10-J43.9). Electronically Signed   By: SJulian HyM.D.   On: 12/08/2019 14:11     ASSESSMENT & PLAN:  Malignant neoplasm of right upper lobe of lung ([facility] # Right upper lobe stage I lung cancer; s/p feb February 2019 SBRT-March 2021 CT scan [see below]-no obvious evidence of recurrence.  # Left lung stage III lung cancer-clinically stable./See below  #Bilateral MAC infection-on ethambutol [9 to 12 months per Dr. RLevester Fresh ID];  CT scan March 9th 2021-STABLE;  cavitary lesions x2 right upper lobe; bilateral subcentimeter lung nodules- STABLE; Patient has appointment with ID in April 2021.  We also reviewed the tumor conference.  # Anemia-likely secondary to chronic inflammation-hemoglobin 10.5- STABLE.  # COPD- worse; defer to PCP/pulmonary regarding O2 supplementation /managing COPD.   # I discussed regarding Covid-19 precautions.  I reviewed the vaccine effectiveness and potential side effects in detail.  Also discussed long-term effectiveness and safety profile are unclear at this time.  I discussed December, 2020 [facility] position statement-that all patients are recommended COVID-19 vaccinations [when available]-as long as they do not have  allergy to components of the vaccine.  However, I think the benefits of the vaccination outweigh the potential risks. Re: [id number].  # DISPOSITION:  # Follow up in 3 months- MD- cbc/Cmp/port flush;Dr.B  Cc: Drs. Kasa/Ravikrishnan/ Crystal/ Hedrick.     Orders Placed This Encounter  Procedures  . CBC with Differential    Standing Status:   Future    Standing Expiration Date:   12/08/2020  . Comprehensive metabolic panel    Standing Status:   Future    Standing Expiration Date:   12/08/2020   All  questions were answered. The patient knows to call the clinic with any problems, questions or concerns.      Cammie Sickle, MD 12/09/2019 12:35 PM

## 2019-12-09 NOTE — Assessment & Plan Note (Addendum)
#   Right upper lobe stage I lung cancer; s/p feb February 2019 SBRT-March 2021 CT scan [see below]-no obvious evidence of recurrence.  # Left lung stage III lung cancer-clinically stable./See below  #Bilateral MAC infection-on ethambutol [9 to 12 months per Dr. Levester Fresh, ID];  CT scan March 9th 2021-STABLE;  cavitary lesions x2 right upper lobe; bilateral subcentimeter lung nodules- STABLE; Patient has appointment with ID in April 2021.  We also reviewed the tumor conference.  # Anemia-likely secondary to chronic inflammation-hemoglobin 10.5- STABLE.  # COPD- worse; defer to PCP/pulmonary regarding O2 supplementation /managing COPD.   # I discussed regarding Covid-19 precautions.  I reviewed the vaccine effectiveness and potential side effects in detail.  Also discussed long-term effectiveness and safety profile are unclear at this time.  I discussed December, 2020 [facility] position statement-that all patients are recommended COVID-19 vaccinations [when available]-as long as they do not have allergy to components of the vaccine.  However, I think the benefits of the vaccination outweigh the potential risks. Re: [id number] vaccination.  # DISPOSITION:  # Follow up in 3 months- MD- cbc/Cmp/port flush;Dr.B  Cc: Drs. Kasa/Ravikrishnan/ Crystal/ Hedrick.

## 2019-12-09 NOTE — Patient Instructions (Signed)
For more information/scheduling recommend call [facility] [phone], 8:30am-4:30pm.

## 2019-12-11 ENCOUNTER — Ambulatory Visit: Payer: PPO | Admitting: Infectious Diseases

## 2019-12-11 ENCOUNTER — Other Ambulatory Visit: Payer: PPO

## 2019-12-11 NOTE — Progress Notes (Signed)
Tumor Board Documentation  ARIAM MOL was presented by Dr Rogue Bussing at our Tumor Board on 12/11/2019, which included representatives from medical oncology, radiation oncology, navigation, pathology, surgical, radiology, surgical oncology, research, palliative care.  Adiana currently presents as a current patient, for discussion with history of the following treatments: surgical intervention(s), immunotherapy, neoadjuvant chemoradiation, active survellience.  Additionally, we reviewed previous medical and familial history, history of present illness, and recent lab results along with all available histopathologic and imaging studies. The tumor board considered available treatment options and made the following recommendations:   Repeat Ct scan , Continue Anti Microbial therapy  The following procedures/referrals were also placed: No orders of the defined types were placed in this encounter.   Clinical Trial Status: not discussed   Staging used: AJCC Stage Group  AJCC Staging: T: 2b N: 0   Group: Stage 3 A Squamous Cell Lung cancer   National site-specific guidelines NCCN were discussed with respect to the case.  Tumor board is a meeting of clinicians from various specialty areas who evaluate and discuss patients for whom a multidisciplinary approach is being considered. Final determinations in the plan of care are those of the provider(s). The responsibility for follow up of recommendations given during tumor board is that of the provider.   Today's extended care, comprehensive team conference, Florean was not present for the discussion and was not examined.   Multidisciplinary Tumor Board is a multidisciplinary case peer review process.  Decisions discussed in the Multidisciplinary Tumor Board reflect the opinions of the specialists present at the conference without having examined the patient.  Ultimately, treatment and diagnostic decisions rest with the primary provider(s) and  the patient.

## 2019-12-14 ENCOUNTER — Telehealth: Payer: Self-pay | Admitting: Internal Medicine

## 2019-12-14 NOTE — Telephone Encounter (Signed)
Spoke to patient regarding the results of the discussion with the tumor conference-increasing pleural based thickening-clinically suggestive of infectious rather than malignant etiology.  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  Recommend follow-up imaging in 6 months as patient is clinically doing well.  Discussed with the patient.  In agreement. Keep appt in 3 months as planned.   Cc; Drs. Katha Cabal.

## 2019-12-18 MED FILL — ETHAMBUTOL HCL 400 MG TAB: 400 | 30 days supply | Qty: 45 | Fill #4

## 2019-12-18 MED FILL — RIFABUTIN 150 MG CAPSULE: 150 | 30 days supply | Qty: 60 | Fill #4

## 2019-12-25 MED FILL — AZITHROMYCIN 500 MG TABS: 500 | 30 days supply | Qty: 30 | Fill #3

## 2020-01-01 ENCOUNTER — Ambulatory Visit: Payer: PPO | Attending: Infectious Diseases | Admitting: Infectious Diseases

## 2020-01-01 ENCOUNTER — Other Ambulatory Visit: Payer: Self-pay

## 2020-01-01 ENCOUNTER — Encounter: Payer: Self-pay | Admitting: Infectious Diseases

## 2020-01-01 VITALS — BP 125/91 | HR 98 | Temp 98.4°F | Resp 16 | Ht 63.0 in | Wt 97.0 lb

## 2020-01-01 DIAGNOSIS — A310 Pulmonary mycobacterial infection: Secondary | ICD-10-CM

## 2020-01-01 DIAGNOSIS — Z7951 Long term (current) use of inhaled steroids: Secondary | ICD-10-CM | POA: Insufficient documentation

## 2020-01-01 DIAGNOSIS — J449 Chronic obstructive pulmonary disease, unspecified: Secondary | ICD-10-CM | POA: Insufficient documentation

## 2020-01-01 DIAGNOSIS — Z902 Acquired absence of lung [part of]: Secondary | ICD-10-CM | POA: Diagnosis not present

## 2020-01-01 DIAGNOSIS — Z85118 Personal history of other malignant neoplasm of bronchus and lung: Secondary | ICD-10-CM | POA: Insufficient documentation

## 2020-01-01 DIAGNOSIS — Z79899 Other long term (current) drug therapy: Secondary | ICD-10-CM | POA: Diagnosis not present

## 2020-01-01 DIAGNOSIS — Z7982 Long term (current) use of aspirin: Secondary | ICD-10-CM | POA: Insufficient documentation

## 2020-01-01 DIAGNOSIS — Z87891 Personal history of nicotine dependence: Secondary | ICD-10-CM | POA: Diagnosis not present

## 2020-01-01 MED ORDER — RIFABUTIN 150 MG PO CAPS: 300.0000 mg | ORAL_CAPSULE | Freq: Every day | ORAL | 3 refills | Status: DC

## 2020-01-01 MED ORDER — AZITHROMYCIN 500 MG PO TABS: 500.0000 mg | ORAL_TABLET | Freq: Every day | ORAL | 3 refills | Status: DC

## 2020-01-01 MED ORDER — ETHAMBUTOL HCL 400 MG PO TABS: 15.0000 mg/kg | ORAL_TABLET | Freq: Every day | ORAL | 3 refills | Status: DC

## 2020-01-01 NOTE — Progress Notes (Signed)
NAME: Claudia Chavez  DOB: [birth year]  MRN: [id number]  Date/Time: 01/01/2020 9:22 AM  Dr.Kasa Maretta Bees Subjective:  follow up visit for pulmonary MAC infection Started treatment 11/11/18 Last visit on 09/16/19 Pt here to follow up on Pulmonary MAC infection- she has treated lung cancer/COPD On her visit in Dec she had stopped taking her meds and she restarted after she saw me  Currently on azithromycin 500 mg once a day, ethambutol 21m/kg body weight (6057m and rifabutin  300 mg a day.    She is no longer anemic and has not needed any blood transfusion in the last 5 months.  She has no nausea or vomiting or diarrhea.  No rash.  Appetite good.  Not fatigued  Last CT scan on March 8th 2021 - Progressive thick-walled cavitary lesion in the right posterior Saw her oncologist Dr.Brahmandy the next day- observation Medical history She is a [age]ear old female who has a  history of Lung cancer , COPD Patient was diagnosed non-small cell right lung cancer in July 2016 and underwent 2 cycles of chemotherapy with Taxol and carboplatin  .   She developed  neuropathy.  And chemo was stopped.  [facility] This was followed by radiation therapy to the right lung in Fe2017/02/18or 30 days.  In May 2017 a repeat CAT scan was noted as interval right lower lobe resection without evidence of local recurrence or definite metastatic disease. IShe was followed by oncology with serial CAT scans and PET scans. PET scan Feb 9th 2018- no evidence of recurrence; postoperative changes/stable right hilar lymph node without any activity noted. On 05/25/17 CT showed partial cavitary right upper lobe pulmonary nodule, most consistent with metastatic disease versus metachronous primary.  She was seen by pulmonologist Dr.Casa and underwent bronch and biopsy on 10/25/17.  Pathology report was VERY SCANT LUNG TISSUE WITH PIGMENTED MACROPHAGES. - NO NEOPLASM IS  IDENTIFIED IN THIS SPECIMEN She received  SBRT  to the right lung X 5 doses-   Subsequently she was complaining of cough and productive sputum and she was treated with multiple courses of antibiotics including Levaquin.  A repeat CT scan was done in  Jan 2020 and it showed Thick-walled, partially cavitary process in the right lung apex measuring 5.4 x 7.4 cm (series 3/image 34), progressive, previously 4.2 x 2.9 cm.  Additional thick-walled cavitary process posteriorly in the right upper lobe measuring 8.1 x 5.5 cm (series 3/image 61), previously 7.7 x 5.1 cm when measured in a similar fashion.      On 10/10/2018 she underwent bronchoscopy and bronchoalveolar lavage was sent for AFB and fungal and bacterial cultures.  The pathology showed BENIGN ALVEOLATED LUNG PARENCHYMA AND BRONCHIAL WALL WITH FOCAL  ORGANIZING FIBRIN. - NO EVIDENCE OF TUMOR OR GRANULOMA.    AFB and GMS special stains were negative. The AFB nucleic acid amplification testing was done and that was positive for Mycobacterium avium intracellulare and she was referred to me in FeFebruary 18, 2020 Marland Kitchentarted Anti MAC treatment fe2020-02-18Repeat Sputum in Sept had MAC and it was still susceptible to macrolide In Nov 2020 she has stopped meds because of pill fatigue and restarted Dec 2020 CT scan done in March 2021     She was a smoker until December 2019.  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 Husband died in 20February 18, 2018f lung cancer as well.  Past Medical History:  Diagnosis Date  . Cancer of lower lobe of right lung ([place]) 05/07/2015  . COPD (chronic obstructive pulmonary disease) ([place])   . Dyspnea   . Non-small cell lung cancer ([place])   . Pneumonia 04/2015  . Pulmonary Mycobacterium avium complex (MAC) infection ([place]) 10/25/2018    Past Surgical History:  Procedure Laterality Date  . ABDOMINAL HYSTERECTOMY    . COLONOSCOPY WITH PROPOFOL N/A 06/09/2018   Procedure: COLONOSCOPY WITH PROPOFOL;  Surgeon: Toledo,  Benay Pike, MD;  Location: [facility] END[facility]COPY;  Service: Gastroenterology;  Laterality: N/A;  . ELBOW SURGERY Right 1995  . ELECTROMAGNETIC NAVIGATION BROCHOSCOPY N/A 10/25/2017   Procedure: ELECTROMAGNETIC NAVIGATION BRONCHOSCOPY;  Surgeon: [name], MD;  Location: [facility] ORS;  Service: Cardiopulmonary;  Laterality: N/A;  . ESOPHAGOGASTRODUODENOSCOPY N/A 06/08/2018   Procedure: ESOPHAGOGASTRODUODENOSCOPY (EGD);  Surgeon: Toledo, Benay Pike, MD;  Location: [facility] ENDOSCOPY;  Service: Gastroenterology;  Laterality: N/A;  . FLEXIBLE BRONCHOSCOPY Right 10/10/2018   Procedure: FLEXIBLE BRONCHOSCOPY;  Surgeon: [name], MD;  Location: [facility] ORS;  Service: Cardiopulmonary;  Laterality: Right;  . PORTACATH PLACEMENT Right 05/10/2015   Procedure: INSERTION PORT-A-CATH;  Surgeon: Nestor Lewandowsky, MD;  Location: [facility] OR[facility];  Service: General;  Laterality: Right;  Marland Kitchen VIDEO ASSISTED THORACOSCOPY (VATS)/THOROCOTOMY Right 08/18/2015   Procedure: VIDEO ASSISTED THORACOSCOPY (VATS)/THOROCOTOMY;  Surgeon: Nestor Lewandowsky, MD;  Location: [facility] ORS;  Service: General;  Laterality: Right;    Social History   Socioeconomic History  . Marital status: Widowed    Spouse name: Not on file  . Number of children: Not on file  . Years of education: Not on file  . Highest education level: Not on file  Occupational History  . Not on file  Tobacco Use  . Smoking status: Former Smoker    Packs/day: 0.50    Years: 40.00    Pack years: 20.00    Types: Cigarettes    Quit date: 09/12/2018    Years since quitting: 1.3  . Smokeless tobacco: Never Used  Substance and Sexual Activity  . Alcohol use: Not Currently    Alcohol/week: 2.0 - 4.0 standard drinks    Types: 2 - 4 Cans of beer per week    Comment: beer-occasionally  . Drug use: No  . Sexual activity: Never  Other Topics Concern  . Not on file  Social History Narrative  . Not on file   Social Determinants of Health   Financial Resource Strain:   . Difficulty of Paying  Living Expenses:   Food Insecurity:   . Worried About Charity fundraiser in the Last Year:   . Arboriculturist in the Last Year:   Transportation Needs:   . Film/video editor (Medical):   Marland Kitchen Lack of Transportation (Non-Medical):   Physical Activity:   . Days of Exercise per Week:   . Minutes of Exercise per Session:   Stress:   . Feeling of Stress :   Social Connections:   . Frequency of Communication with Friends and Family:   . Frequency of Social Gatherings with Friends and Family:   . Attends Religious Services:   . Active Member of Clubs or Organizations:   . Attends Archivist Meetings:   Marland Kitchen Marital Status:   Intimate Partner Violence:   . Fear of Current or Ex-Partner:   . Emotionally Abused:   Marland Kitchen Physically Abused:   . Sexually Abused:     Family History  Problem Relation Age of Onset  . Stroke Mother   .  Lung cancer Father 18   Allergies  Allergen Reactions  . Keppra [Levetiracetam] Other (See Comments)    Nausea and dizzy  . Lyrica [Pregabalin] Other (See Comments)    Made patient feel very dizzy and not feel good.    Current medications  ? Current Outpatient Medications  Medication Sig Dispense Refill  . albuterol (PROVENTIL HFA;VENTOLIN HFA) 108 (90 Base) MCG/ACT inhaler Inhale 2 puffs into the lungs every 6 (six) hours as needed for wheezing or shortness of breath.     Marland Kitchen aspirin EC 81 MG tablet Take 81 mg by mouth daily.    . benzonatate (TESSALON) 100 MG capsule TAKE 1 CAPSULE BY MOUTH THREE TIMES A DAY AS NEEDED FOR COUGH 90 capsule 3  . Cholecalciferol (VITAMIN D3) 1000 units CAPS Take 2,000 Units by mouth daily.     Marland Kitchen ethambutol (MYAMBUTOL) 400 MG tablet Take 1.5 tablets (600 mg total) by mouth daily. 45 tablet 3  . Fluticasone-Salmeterol (ADVAIR DISKUS) 250-50 MCG/DOSE AEPB Inhale 1 puff into the lungs every 12 (twelve) hours.     Marland Kitchen guaiFENesin (MUCINEX) 600 MG 12 hr tablet Take 1 tablet (600 mg total) by mouth 2 (two) times daily. 30  tablet 0  . ipratropium-albuterol (DUONEB) 0.5-2.5 (3) MG/3ML SOLN Take 3 mLs by nebulization every 4 (four) hours as needed. 360 mL 3  . Respiratory Therapy Supplies (FLUTTER) DEVI 1 each by Does not apply route daily. 1 each 0  . rifabutin (MYCOBUTIN) 150 MG capsule Take 2 capsules (300 mg total) by mouth daily. 60 capsule 3  . SPIRIVA RESPIMAT 2.5 MCG/ACT AERS SMARTSIG:2 Inhalation Via Inhaler Daily    . vitamin B-12 (CYANOCOBALAMIN) 1000 MCG tablet Take 1,000 mcg by mouth daily.    Marland Kitchen azithromycin (ZITHROMAX) 500 MG tablet Take 500 mg by mouth daily.     No current facility-administered medications for this visit.   Facility-Administered Medications Ordered in Other Visits  Medication Dose Route Frequency Provider Last Rate Last Admin  . sodium chloride flush (NS) 0.9 % injection 10 mL  10 mL Intravenous PRN Cammie Sickle, MD         Abtx:  Anti-infectives (From admission, onward)   None      REVIEW OF SYSTEMS:  Const: negative fever, negative chills, steady weight of 99-97 pounds Eyes: negative diplopia or visual changes, negative eye pain ENT: negative coryza, negative sore throat Resp: Positive cough, productive at times no hemoptysis, no dyspnea Cards: negative for chest pain, palpitations, lower extremity edema GU: negative for frequency, dysuria and hematuria GI: Negative for abdominal pain, diarrhea, bleeding, constipation Skin: negative for rash and pruritus Heme: negative for easy bruising and gum/nose bleeding MS: No fatigue or  weakness Neurolo:negative for headaches, has dizziness, vertigo, memory problems  Psych:  anxiety, depression  Endocrine: No polyuria or polydipsia Allergy/Immunology-allergy to Keppra and Lyrica.  Has side effects like dizziness and nausea Objective:  VITALS:  BP (!) 125/91   Pulse 98   Temp 98.4 F (36.9 C)   Resp 16   Ht _0  (1.6 m)   Wt 97 lb (44 kg)   SpO2 98%   BMI 17.18 kg/m    PHYSICAL EXAM:  General: Alert,  cooperative, no distress, appears stated age.  ,thin built Head: Normocephalic, without obvious abnormality, atraumatic. Eyes: Conjunctivae clear, anicteric sclerae. Pupils are equal ENT Nares normal. No drainage or sinus tenderness. Lips, mucosa, and tongue normal. No Thrush Neck: Supple, symmetrical, no adenopathy, thyroid: non tender no carotid bruit  and no JVD. Back: No CVA tenderness. Lungs: Bilateral air entry crepitations present on the right side Heart: S1-S2 Abdomen: Soft, non-tender,not distended. Bowel sounds normal. No masses Extremities: atraumatic, no cyanosis. No edema. No clubbing Skin: No rashes or lesions. Or bruising Lymph: Cervical, supraclavicular normal. Neurologic: Grossly non-focal Pertinent Labs Lab Results CBC    Component Value Date/Time   WBC 8.8 12/09/2019 1015   RBC 3.51 (L) 12/09/2019 1015   HGB 10.3 (L) 12/09/2019 1015   HCT 32.3 (L) 12/09/2019 1015   PLT 509 (H) 12/09/2019 1015   MCV 92.0 12/09/2019 1015   MCH 29.3 12/09/2019 1015   MCHC 31.9 12/09/2019 1015   RDW 16.6 (H) 12/09/2019 1015   LYMPHSABS 1.2 12/09/2019 1015   MONOABS 0.6 12/09/2019 1015   EOSABS 0.2 12/09/2019 1015   BASOSABS 0.1 12/09/2019 1015    CMP Latest Ref Rng & Units 12/09/2019 12/08/2019 09/09/2019  Glucose 70 - 99 mg/dL 102(H) - 111(H)  BUN 8 - 23 mg/dL 9 - 9  Creatinine 0.44 - 1.00 mg/dL 0.42(L) 0.50 0.52  Sodium 135 - 145 mmol/L 129(L) - 132(L)  Potassium 3.5 - 5.1 mmol/L 3.6 - 3.8  Chloride 98 - 111 mmol/L 98 - 98  CO2 22 - 32 mmol/L 22 - 22  Calcium 8.9 - 10.3 mg/dL 8.6(L) - 8.7(L)  Total Protein 6.5 - 8.1 g/dL 7.6 - 7.6  Total Bilirubin 0.3 - 1.2 mg/dL 0.5 - 0.4  Alkaline Phos 38 - 126 U/L 138(H) - 176(H)  AST 15 - 41 U/L 16 - 17  ALT 0 - 44 U/L 13 - 10      Microbiology: 10/10/2018 acid-fast organism ID by nucleic acid amplification test is M avium complex.     Sept 2020  Impression/Recommendation 64 y.o.female  with a history of Lung cancer ,  COPD   Fibrocavitary  lesions in the right lung.  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   She is on 3 drug combination. On  azithromycin  500 mg once a day, ethambutol 50m/kg body weight (6021m and rifabutin  300 mg a day since February 2020.. Marland Kitchen  She was 100% adherent until she stopped in NOV 2020 and restarted Dec 2020 after she was me- Three sputums repeated inSeptember still has MAC . Susceptible to clarithromycin- so continuing the same regimen- Recent LFts, CBC, BMP from March 2021  stable   Anemia which had necessitated blood transfusion in the past has been stable and hemoglobin is improved and is currently 10.3  Right lung carcinoma.  Status post right lower lobectomy, radiation, recurrence, SBRT.  Followed by Dr. BrYevette Edwardsnd Dr. KaMortimer FriesA repeat CAT done in  March 2021 shows minimal progression in the rt lower lobe cavity . Also has Numerous bilateral pulmonary nodules throughout the lungs bilaterally, stable versus mildly progressive. On observation by Dr.Brahmandy  COPD on inhalers and nebulizers Smoker: Quit smoking December 2019  Will check sputum for afb x 3 Follow up 4 months  ___________________________________________________ Discussed the management in great detail with the patient.

## 2020-01-01 NOTE — Patient Instructions (Addendum)
You are here for follow up of pulmonary infection with MAC- you have been taking your meds since dec 2020 after a break of a few weeks. Will get sputum afb culture and follow up 4 months

## 2020-01-05 ENCOUNTER — Ambulatory Visit: Payer: PPO | Attending: Internal Medicine

## 2020-01-05 DIAGNOSIS — Z23 Encounter for immunization: Secondary | ICD-10-CM

## 2020-01-05 NOTE — Progress Notes (Signed)
   Covid-19 Vaccination Clinic  Name:  RENU ASBY    MRN: [id number] DOB: 10/18/1955  01/05/2020  Ms. Linson was observed post Covid-19 immunization for 15 minutes without incident. She was provided with Vaccine Information Sheet and instruction to access the V-Safe system.   Ms. Garduno was instructed to call 911 with any severe reactions post vaccine: Marland Kitchen Difficulty breathing  . Swelling of face and throat  . A fast heartbeat  . A bad rash all over body  . Dizziness and weakness   Immunizations Administered    Name Date Dose VIS Date Route   Pfizer COVID-19 Vaccine 01/05/2020  9:57 AM 0.3 mL 09/12/2019 Intramuscular   Manufacturer: [place]   Lot: [id number]   [place]: [id number]

## 2020-01-06 ENCOUNTER — Other Ambulatory Visit
Admission: RE | Admit: 2020-01-06 | Discharge: 2020-01-06 | Disposition: A | Discharge status: Home / Self Care | Payer: PPO | Source: Ambulatory Visit | Attending: Infectious Diseases | Admitting: Infectious Diseases

## 2020-01-06 DIAGNOSIS — A310 Pulmonary mycobacterial infection: Secondary | ICD-10-CM | POA: Diagnosis not present

## 2020-01-09 LAB — ACID FAST SMEAR (AFB, MYCOBACTERIA)
Acid Fast Smear: POSITIVE — AB
Acid Fast Smear: POSITIVE — AB

## 2020-01-14 LAB — MAC SUSCEPTIBILITY BROTH

## 2020-01-15 LAB — MAC SUSCEPTIBILITY BROTH

## 2020-01-20 LAB — AFB ORGANISM ID BY DNA PROBE
M avium complex: 15 — AB
M avium complex: POSITIVE — AB
M tuberculosis complex: NEGATIVE
M tuberculosis complex: NEGATIVE

## 2020-01-20 LAB — ACID FAST SMEAR (AFB, MYCOBACTERIA): Acid Fast Smear: POSITIVE — AB

## 2020-01-20 LAB — ACID FAST CULTURE WITH REFLEXED SENSITIVITIES (MYCOBACTERIA)
Acid Fast Culture: POSITIVE — AB
Acid Fast Culture: POSITIVE — AB

## 2020-01-26 DIAGNOSIS — H40033 Anatomical narrow angle, bilateral: Secondary | ICD-10-CM | POA: Diagnosis not present

## 2020-01-26 MED FILL — ETHAMBUTOL HCL 400 MG TAB: 400 | 30 days supply | Qty: 45 | Fill #0

## 2020-01-26 MED FILL — RIFABUTIN 150 MG CAPSULE: 150 | 30 days supply | Qty: 60 | Fill #0

## 2020-01-26 MED FILL — AZITHROMYCIN 500 MG TABS: 500 | 30 days supply | Qty: 30 | Fill #4

## 2020-01-28 ENCOUNTER — Ambulatory Visit: Payer: PPO

## 2020-01-28 DIAGNOSIS — R05 Cough: Secondary | ICD-10-CM | POA: Diagnosis not present

## 2020-01-30 ENCOUNTER — Telehealth: Payer: Self-pay | Admitting: *Deleted

## 2020-01-30 ENCOUNTER — Ambulatory Visit: Payer: PPO

## 2020-01-30 ENCOUNTER — Other Ambulatory Visit: Payer: Self-pay | Admitting: *Deleted

## 2020-01-30 ENCOUNTER — Inpatient Hospital Stay: Payer: PPO | Attending: Internal Medicine | Admitting: Oncology

## 2020-01-30 DIAGNOSIS — R05 Cough: Secondary | ICD-10-CM

## 2020-01-30 DIAGNOSIS — R0781 Pleurodynia: Secondary | ICD-10-CM

## 2020-01-30 DIAGNOSIS — R059 Cough, unspecified: Secondary | ICD-10-CM

## 2020-01-30 MED ORDER — HYDROCOD POLST-CPM POLST ER 10-8 MG/5ML PO SUER: 5.0000 mL | Freq: Two times a day (BID) | ORAL | 0 refills | Status: DC | PRN

## 2020-01-30 MED ORDER — TRAMADOL HCL 50 MG PO TABS: 50.0000 mg | ORAL_TABLET | Freq: Four times a day (QID) | ORAL | 0 refills | Status: DC | PRN

## 2020-01-30 NOTE — Telephone Encounter (Signed)
Patient did notweant to come in to office, does not want to driveand just had labs at [facility] this week and was started on Meloxicam but it has not helped, she asked for a virtual visit to get pain medicine and Lorretta Harp, NP will do Virtual visit at 1 PM. Patient aware and thanked me for this

## 2020-01-30 NOTE — Telephone Encounter (Signed)
[facility]- today-; labs- cbc/cmp;  Sonia Baller- please follow up- Thx  GB

## 2020-01-30 NOTE — Progress Notes (Signed)
Symptom Management Consult note [facility]  Telephone:([phone] [phone]  Patient Care Team: [place] Pink, MD as PCP - General (Family Medicine) Cammie Sickle, MD as Medical Oncologist (Medical Oncology) Minna Merritts, MD as Consulting Physician (Cardiology) Efrain Sella, MD as Consulting Physician (Gastroenterology) Noreene Filbert, MD as Referring Physician (Radiation Oncology)   Name of the patient: Claudia Chavez  [id number]  11-May-1956   Date of visit: 01/30/2020   Diagnosis- Lung Cancer   Chief complaint/ Reason for visit- Lung pain  Heme/Onc history:  Oncology History Overview Note  # Squamous cell carcinoma of right LOWER LOBE OF lung stage IIIA based on PET scan Biopsy of the (August of 2016). 2. After 3 cycles of chemotherapy patient underwent resection of lung mass (November, 2016) ypT2B ypN0 [Dr.Oaks] status post right lower lobectomy with a 3. She was started on carboplatinum and Taxol on a weekly basis and radiation therapy from January of 2017 4. After 2 cycles of carboplatinum patient had neuropathy grade 2 interfering with work so chemotherapy was put on hold and radiation was continued (January 19th, 2017) 5. Chemotherapy was discontinued because of progressive neuropathy. Patient is finishing of radiation therapy on November 19, 2015  # NOV 2017- CT- 62m right hilar LN; PET- Feb 2018- NED.   # DEC-JAN 2019- RUL Lung nodule [s/p Bronch; Dr.Kasa- non-diagnostic] SBRT Feb 2019  #October 10, 2018 bronchoscopy [Dr.Kasa]-negative for malignancy/positive for MAC infection-ethambutol [Dr.Ravishankar]  # AUG 2019- R LE DVT [xarelto]x STOP in end of dec 2019. Syncope- MRI- 13 mm cystic leision/asymptomatic/[facility]; Bil subacute cerebellar stroke;  AUG 2019 - GIB/anemia [EGD/colo- Dr.Toledo; NEG]; Aug 2019-  Severe hypokalemia- sec to Florinef-resolved.   DIAGNOSIS: RLL stage III lung ca; RUL  stage I   GOALS: curative  CURRENT/MOST RECENT THERAPY : Surveillaince    Malignant neoplasm of lung (HCC)  Malignant neoplasm of right upper lobe of lung (HCC)   Interval history-Mrs. SZaniis a 64year [facility] for unprovoked right lower extremity DVT treated with Xarelto and stage IIIa right lung cancer.  She was initially diagnosed in 2016 and received 3 cycles of chemo and then had a resection of lung mass by Dr. OFaith Rogue  Started on weekly carbo/Taxol with radiation in January 2017.  Chemotherapy was ultimately discontinued secondary to neuropathy.  She did complete radiation.  She had a bronchoscopy with Dr. KMortimer Friesafter a CT chest completed on 09/06/2018 showed a large cavitary lesion that was new.  Bronchoscopy revealed positive MAC and she was referred to Dr. RSteva Readyfor further management.  She was started on azithromycin, ethambutol and rifabutin.  Plan is for continued treatment for approximately 1 year.  She was recently evaluated by Dr. RSteva Readyon 01/01/2020 and found to have minimal progression in right lower lung cavity.  Plan is for continued treatment as mentioned above.  Today, she complains of worsening cough X two weeks. She received the first COVID vaccine around the time symptoms began.  Since then she has had an intermittent stabbing sensation with a constant dull tooth ache feeling in LLL and chest wall.  She called her PCP Dr. HChrystine Oileron 01/28/2020 who brought her in for labs and rapid covid testing. Lab worked showed leukocytosis 11.3 and anemia 9.8.  COVID-19 swab was negative.  A virtual visit was conducted and she did not appear in any distress and she was nontoxic-appearing.  He recommended she reach out  to Dr. Steva Ready for recommendations on antibiotic coverage given her new leukocytosis. She was also started on meloxicam for the pain.  Claudia Chavez called Dr. Raelene Bott office and has not heard back.  Pain has  not improved with meloxicam.  She is unable to sleep due to coughing spells and pain.  She denies any recent fevers or illness, she denies any chest pain or increased shortness of breath.  She denies chest pain with inhalation.  She denies a rapid heart rate.  She is tired and fatigued but she feels this is likely due to lack of sleep.  ECOG FS:1 - Symptomatic but completely ambulatory  Review of systems- Review of Systems  Constitutional: Positive for malaise/fatigue. Negative for chills, fever and weight loss.  HENT: Negative for congestion, ear pain and tinnitus.   Eyes: Negative.  Negative for blurred vision and double vision.  Respiratory: Positive for cough. Negative for sputum production and shortness of breath.        RLL pain  Cardiovascular: Negative.  Negative for chest pain, palpitations and leg swelling.  Gastrointestinal: Negative.  Negative for abdominal pain, constipation, diarrhea, nausea and vomiting.  Genitourinary: Negative for dysuria, frequency and urgency.  Musculoskeletal: Negative for back pain and falls.  Skin: Negative.  Negative for rash.  Neurological: Negative.  Negative for weakness and headaches.  Endo/Heme/Allergies: Negative.  Does not bruise/bleed easily.  Psychiatric/Behavioral: Negative for depression. The patient is nervous/anxious and has insomnia.      Current treatment-status post lobectomy.  Currently on observation.  Being treated for MAC by Dr. Steva Ready.  Allergies  Allergen Reactions  . Keppra [Levetiracetam] Other (See Comments)    Nausea and dizzy  . Lyrica [Pregabalin] Other (See Comments)    Made patient feel very dizzy and not feel good.      Past Medical History:  Diagnosis Date  . Cancer of lower lobe of right lung ([place]) 05/07/2015  . COPD (chronic obstructive pulmonary disease) ([place])   . Dyspnea   . Non-small cell lung cancer ([place])   . Pneumonia 04/2015  . Pulmonary Mycobacterium avium complex (MAC) infection ([place]) 10/25/2018      Past Surgical History:  Procedure Laterality Date  . ABDOMINAL HYSTERECTOMY    . COLONOSCOPY WITH PROPOFOL N/A 06/09/2018   Procedure: COLONOSCOPY WITH PROPOFOL;  Surgeon: Toledo, Benay Pike, MD;  Location: [facility] ENDOSCOPY;  Service: Gastroenterology;  Laterality: N/A;  . ELBOW SURGERY Right 1995  . ELECTROMAGNETIC NAVIGATION BROCHOSCOPY N/A 10/25/2017   Procedure: ELECTROMAGNETIC NAVIGATION BRONCHOSCOPY;  Surgeon: [name], MD;  Location: [facility] ORS;  Service: Cardiopulmonary;  Laterality: N/A;  . ESOPHAGOGASTRODUODENOSCOPY N/A 06/08/2018   Procedure: ESOPHAGOGASTRODUODENOSCOPY (EGD);  Surgeon: Toledo, Benay Pike, MD;  Location: [facility] ENDOSCOPY;  Service: Gastroenterology;  Laterality: N/A;  . FLEXIBLE BRONCHOSCOPY Right 10/10/2018   Procedure: FLEXIBLE BRONCHOSCOPY;  Surgeon: [name], MD;  Location: [facility] ORS;  Service: Cardiopulmonary;  Laterality: Right;  . PORTACATH PLACEMENT Right 05/10/2015   Procedure: INSERTION PORT-A-CATH;  Surgeon: Nestor Lewandowsky, MD;  Location: [facility] ORS;  Service: General;  Laterality: Right;  Marland Kitchen VIDEO ASSISTED THORACOSCOPY (VATS)/THOROCOTOMY Right 08/18/2015   Procedure: VIDEO ASSISTED THORACOSCOPY (VATS)/THOROCOTOMY;  Surgeon: Nestor Lewandowsky, MD;  Location: [facility] ORS;  Service: General;  Laterality: Right;    Social History   Socioeconomic History  . Marital status: Widowed    Spouse name: Not on file  . Number of children: Not on file  . Years of education: Not on file  . Highest education level: Not on file  Occupational History  . Not on file  Tobacco Use  . Smoking status: Former Smoker    Packs/day: 0.50    Years: 40.00    Pack years: 20.00    Types: Cigarettes    Quit date: 09/12/2018    Years since quitting: 1.3  . Smokeless tobacco: Never Used  Substance and Sexual Activity  . Alcohol use: Not Currently    Alcohol/week: 2.0 - 4.0 standard drinks    Types: 2 - 4 Cans of beer per week    Comment: beer-occasionally  . Drug use: No  . Sexual  activity: Never  Other Topics Concern  . Not on file  Social History Narrative  . Not on file   Social Determinants of Health   Financial Resource Strain:   . Difficulty of Paying Living Expenses:   Food Insecurity:   . Worried About Charity fundraiser in the Last Year:   . Arboriculturist in the Last Year:   Transportation Needs:   . Film/video editor (Medical):   Marland Kitchen Lack of Transportation (Non-Medical):   Physical Activity:   . Days of Exercise per Week:   . Minutes of Exercise per Session:   Stress:   . Feeling of Stress :   Social Connections:   . Frequency of Communication with Friends and Family:   . Frequency of Social Gatherings with Friends and Family:   . Attends Religious Services:   . Active Member of Clubs or Organizations:   . Attends Archivist Meetings:   Marland Kitchen Marital Status:   Intimate Partner Violence:   . Fear of Current or Ex-Partner:   . Emotionally Abused:   Marland Kitchen Physically Abused:   . Sexually Abused:     Family History  Problem Relation Age of Onset  . Stroke Mother   . Lung cancer Father 35     Current Outpatient Medications:  .  albuterol (PROVENTIL HFA;VENTOLIN HFA) 108 (90 Base) MCG/ACT inhaler, Inhale 2 puffs into the lungs every 6 (six) hours as needed for wheezing or shortness of breath. , Disp: , Rfl:  .  aspirin EC 81 MG tablet, Take 81 mg by mouth daily., Disp: , Rfl:  .  azithromycin (ZITHROMAX) 500 MG tablet, Take 1 tablet (500 mg total) by mouth daily., Disp: 30 tablet, Rfl: 3 .  benzonatate (TESSALON) 100 MG capsule, TAKE 1 CAPSULE BY MOUTH THREE TIMES A DAY AS NEEDED FOR COUGH, Disp: 90 capsule, Rfl: 3 .  chlorpheniramine-HYDROcodone (TUSSIONEX) 10-8 MG/5ML SUER, Take 5 mLs by mouth every 12 (twelve) hours as needed for cough., Disp: 140 mL, Rfl: 0 .  Cholecalciferol (VITAMIN D3) 1000 units CAPS, Take 2,000 Units by mouth daily. , Disp: , Rfl:  .  ethambutol (MYAMBUTOL) 400 MG tablet, Take 1.5 tablets (600 mg total) by  mouth daily., Disp: 45 tablet, Rfl: 3 .  Fluticasone-Salmeterol (ADVAIR DISKUS) 250-50 MCG/DOSE AEPB, Inhale 1 puff into the lungs every 12 (twelve) hours. , Disp: , Rfl:  .  guaiFENesin (MUCINEX) 600 MG 12 hr tablet, Take 1 tablet (600 mg total) by mouth 2 (two) times daily., Disp: 30 tablet, Rfl: 0 .  ipratropium-albuterol (DUONEB) 0.5-2.5 (3) MG/3ML SOLN, Take 3 mLs by nebulization every 4 (four) hours as needed., Disp: 360 mL, Rfl: 3 .  Respiratory Therapy Supplies (FLUTTER) DEVI, 1 each by Does not apply route daily., Disp: 1 each, Rfl: 0 .  rifabutin (MYCOBUTIN) 150 MG capsule, Take 2 capsules (300 mg total) by mouth  daily., Disp: 60 capsule, Rfl: 3 .  SPIRIVA RESPIMAT 2.5 MCG/ACT AERS, SMARTSIG:2 Inhalation Via Inhaler Daily, Disp: , Rfl:  .  traMADol (ULTRAM) 50 MG tablet, Take 1 tablet (50 mg total) by mouth every 6 (six) hours as needed., Disp: 60 tablet, Rfl: 0 .  vitamin B-12 (CYANOCOBALAMIN) 1000 MCG tablet, Take 1,000 mcg by mouth daily., Disp: , Rfl:  No current facility-administered medications for this visit.  Facility-Administered Medications Ordered in Other Visits:  .  sodium chloride flush (NS) 0.9 % injection 10 mL, 10 mL, Intravenous, PRN, Cammie Sickle, MD  Physical exam: There were no vitals filed for this visit. Physical Exam Vitals reviewed: non-toxic appearing.  Constitutional:      Appearance: Normal appearance.  Cardiovascular:     Rate and Rhythm: Normal rate.     Comments: HR 97 BP120/69 Skin:    General: Skin is warm.  Neurological:     Mental Status: She is alert and oriented to person, place, and time.      CMP Latest Ref Rng & Units 12/09/2019  Glucose 70 - 99 mg/dL 102(H)  BUN 8 - 23 mg/dL 9  Creatinine 0.44 - 1.00 mg/dL 0.42(L)  Sodium 135 - 145 mmol/L 129(L)  Potassium 3.5 - 5.1 mmol/L 3.6  Chloride 98 - 111 mmol/L 98  CO2 22 - 32 mmol/L 22  Calcium 8.9 - 10.3 mg/dL 8.6(L)  Total Protein 6.5 - 8.1 g/dL 7.6  Total Bilirubin 0.3 - 1.2  mg/dL 0.5  Alkaline Phos 38 - 126 U/L 138(H)  AST 15 - 41 U/L 16  ALT 0 - 44 U/L 13   CBC Latest Ref Rng & Units 12/09/2019  WBC 4.0 - 10.5 K/uL 8.8  Hemoglobin 12.0 - 15.0 g/dL 10.3(L)  Hematocrit 36.0 - 46.0 % 32.3(L)  Platelets 150 - 400 K/uL 509(H)    No images are attached to the encounter.  DG Chest 2 View  Result Date: 02/02/2020 CLINICAL DATA:  Cough, shortness of breath. EXAM: CHEST - 2 VIEW COMPARISON:  April 03, 2019. FINDINGS: The heart size and mediastinal contours are within normal limits. Right internal jugular Port-A-Cath is unchanged in position. Multiple pulmonary nodules are noted bilaterally, right greater than left. These appear to be increased in the right upper lobe. Emphysematous disease is noted in the right upper lobe with possible cavitary abnormality is well. Pleural thickening is noted on the right. No pneumothorax is noted. Small right pleural effusion is noted. The visualized skeletal structures are unremarkable. IMPRESSION: Multiple bilateral pulmonary nodules are noted, right greater than left. These appear to be increased in the right upper lobe compared to prior exam. Emphysematous disease is noted in the right upper lobe. Possible cavitary abnormality is noted in the right upper lobe. CT scan of the chest with intravenous contrast is recommended for further evaluation. Electronically Signed   By: Marijo Conception M.D.   On: 02/02/2020 10:16    Assessment and plan- Patient is a 64 y.o. female with above history who presents to symptom management with persistent cough times about 2 weeks.   Claudia Chavez has significant pulmonary history including lung cancer status post lobectomy and MAC currently being treated with antibiotics.  Unclear of etiology of new onset right pleuritic chest pain.  Differentials include recurrence of lung cancer, new infection, worsening MAC, pulmonary embolus verse many others.  Claudia Chavez was able to check her blood pressure and heart rate at  home which appear stable.  Blood pressure was 120/69  with a heart rate of 97.  Oxygen saturations were 96% on room air.  Spoke with Dr. Yevette Edwards who recommended she be evaluated with imaging.  Unfortunately, patient did not wish to come to clinic and would rather wait until Monday.  She also does not have reliable transportation and does not believe she would have had somebody to drive her this afternoon.  We spoke at length about worsening of her symptoms and either EMS or emergency room evaluation.   Plan: Rx Tussionex for cough. Rx tramadol 50 mg q 6 hours prn for pain.  STAT Chest X-ray- likely Monday per patient.  Touch base with Dr. Delaine Lame to discuss leukocytosis.   Disposition: RTC on 03/10/2020 for labs and MD assessment.   Visit Diagnosis 1. Cough     Patient expressed understanding and was in agreement with this plan. She also understands that She can call clinic at any time with any questions, concerns, or complaints.   Greater than 50% was spent in counseling and coordination of care with this patient including but not limited to discussion of the relevant topics above (See A&P) including, but not limited to diagnosis and management of acute and chronic medical conditions.   Thank you for allowing me to participate in the care of this very pleasant patient.    Jacquelin Hawking, NP [place] [facility] Cell - [phone] Pager- [phone] 02/03/2020 2:13 PM

## 2020-01-30 NOTE — Telephone Encounter (Signed)
Will do! Thanks.  Sonia Baller

## 2020-01-30 NOTE — Telephone Encounter (Signed)
Patient called reporting severe pain in her right lung where her infection is located. She repotrs that she called Dr Virgel Manifold office yesterday and that she was to get a return call from nurse , but she has not heard from them and she reports that their office is closed on Fridays. She states that the pain started 2 weeks ago and has gotten progressively worse. She is asking for Dr B to advise her on what to do. Please advise

## 2020-02-02 ENCOUNTER — Ambulatory Visit
Admission: RE | Admit: 2020-02-02 | Discharge: 2020-02-02 | Disposition: A | Discharge status: Home / Self Care | Payer: PPO | Source: Ambulatory Visit | Attending: Oncology | Admitting: Oncology

## 2020-02-02 ENCOUNTER — Ambulatory Visit
Admission: RE | Admit: 2020-02-02 | Discharge: 2020-02-02 | Disposition: A | Discharge status: Home / Self Care | Payer: PPO | Attending: Oncology | Admitting: Oncology

## 2020-02-02 ENCOUNTER — Telehealth: Payer: Self-pay | Admitting: Oncology

## 2020-02-02 ENCOUNTER — Other Ambulatory Visit: Payer: Self-pay

## 2020-02-02 DIAGNOSIS — R059 Cough, unspecified: Secondary | ICD-10-CM

## 2020-02-02 DIAGNOSIS — R05 Cough: Secondary | ICD-10-CM | POA: Insufficient documentation

## 2020-02-02 DIAGNOSIS — R0602 Shortness of breath: Secondary | ICD-10-CM | POA: Diagnosis not present

## 2020-02-02 NOTE — Telephone Encounter (Signed)
Re: Chest X-ray Results  Patient was evaluated in symptom management on Friday, 01/30/2020 for a worsening cough and stabbing sensation to left chest wall.  Patient asked to get imaging on Friday but unfortunately, she was unable to make it.  Had chest x-ray completed this morning.  Chest x-ray showed multiple bilateral pulmonary nodules right greater than left which appeared increased from previous chest x-ray from July 2020.  Emphysema noted in right upper lobe and cavitary lesion in right upper lobe.  Cough has improved with taking Tussinex cough syrup. She has also had improvement on chest pain since beginning tramadol. She states she still feels "lousy" but stable. No new symptoms.  Spoke with Dr. Yevette Edwards who recommended holding off for now on additional imaging.  She is to continue Tussionex and tramadol as needed for chest pain and cough.   We have also asked that she try to get in touch with Dr. Raelene Bott office to ask about leukocytosis noted at her PCPs office last week.   Faythe Casa, NP 02/02/2020 2:53 PM

## 2020-02-03 ENCOUNTER — Telehealth: Payer: Self-pay

## 2020-02-04 ENCOUNTER — Telehealth: Payer: Self-pay | Admitting: Internal Medicine

## 2020-02-04 NOTE — Telephone Encounter (Signed)
Spoke to patient-her chest pain/cough is improved since being on Tussionex.  Reviewed results of the chest x-ray; however this was compared to previous chest x-ray from 2020.    Awaiting appointment with ID tomorrow.  With regards to shortness of breath recommend follow-up with pulmonary.  Follow-up with me as planned.

## 2020-02-05 ENCOUNTER — Ambulatory Visit: Payer: PPO | Attending: Infectious Diseases | Admitting: Infectious Diseases

## 2020-02-05 ENCOUNTER — Other Ambulatory Visit: Payer: Self-pay

## 2020-02-05 ENCOUNTER — Encounter: Payer: Self-pay | Admitting: Infectious Diseases

## 2020-02-05 VITALS — BP 131/69 | HR 97 | Temp 98.0°F | Resp 16 | Ht 63.0 in | Wt 90.0 lb

## 2020-02-05 DIAGNOSIS — D649 Anemia, unspecified: Secondary | ICD-10-CM | POA: Insufficient documentation

## 2020-02-05 DIAGNOSIS — J449 Chronic obstructive pulmonary disease, unspecified: Secondary | ICD-10-CM | POA: Insufficient documentation

## 2020-02-05 DIAGNOSIS — A310 Pulmonary mycobacterial infection: Secondary | ICD-10-CM | POA: Insufficient documentation

## 2020-02-05 DIAGNOSIS — R05 Cough: Secondary | ICD-10-CM | POA: Diagnosis not present

## 2020-02-05 DIAGNOSIS — Z87891 Personal history of nicotine dependence: Secondary | ICD-10-CM | POA: Insufficient documentation

## 2020-02-05 DIAGNOSIS — H903 Sensorineural hearing loss, bilateral: Secondary | ICD-10-CM | POA: Diagnosis not present

## 2020-02-05 DIAGNOSIS — R109 Unspecified abdominal pain: Secondary | ICD-10-CM | POA: Diagnosis not present

## 2020-02-05 DIAGNOSIS — Z85118 Personal history of other malignant neoplasm of bronchus and lung: Secondary | ICD-10-CM | POA: Insufficient documentation

## 2020-02-05 DIAGNOSIS — Z79899 Other long term (current) drug therapy: Secondary | ICD-10-CM | POA: Insufficient documentation

## 2020-02-05 MED ORDER — DEXTROSE 5 % IV SOLN: 1000.0000 mg | Freq: Once | INTRAVENOUS | Status: DC

## 2020-02-05 NOTE — Progress Notes (Unsigned)
Diagnosis: Pulmonary MAC infection- cavitary and nodular infiltrates Baseline Creatinine <1   Allergies  Allergen Reactions  . Keppra [Levetiracetam] Other (See Comments)    Nausea and dizzy  . Lyrica [Pregabalin] Other (See Comments)    Made patient feel very dizzy and not feel good.     OPAT Orders Amikacin 1077m IVPB every Monday/Wednesday and Friday Adjust dose according to amikacin levels Will check Amikacin level peak, ( 1hr after administration-) goal 65-80 Trough prior to infusion goal < 4  Labs Monday and Friday Amikacin peak and trough CBC /CMP on Monday BMP on friday  Duration: 12 weeks ( may need more 05/08/20  [facility] care   Call [phone] [phone] any questions

## 2020-02-05 NOTE — Patient Instructions (Signed)
You are here for worsening respiratory  symptoms You will be started on Amikacin IV along with your other three antibiotics which you will take PO Tomorrow you will go the day surgery  For IV antibiotic- three times a week- Mon/wednesday and Friday  Now you will go to the audiology clinic at [facility]  clinic for hearing check up  Will see you back in 3 weeks

## 2020-02-05 NOTE — Progress Notes (Addendum)
NAME: Claudia Chavez  DOB: 11/21/1955  MRN: [id number]  Date/Time: 02/05/2020 12:08 PM  Dr.Kasa Maretta Bees Subjective:  follow up visit for pulmonary MAC infection Started treatment 11/11/18 Last visit on 01/01/20 Urgent visit Because she is coughing a lot and causing rt sided pain. All of this started 3 weeks ago- she has underlying Pt here to follow up on Pulmonary MAC infection- she has treated lung cancer/COPD  in Dec she had stopped taking her meds--She was off meds for 2-3 weeks.    Currently on azithromycin 500 mg once a day, ethambutol 4m/kg body weight (6044m and rifabutin  300 mg a day.  She started meds in Feb 2020 but has never cleared the sputum-  She was doing better with more negery and not needing blood transfusion until 3 weeks ago  Last CT scan on March 8th 2021 - Progressive thick-walled cavitary lesion in the right posterior  Has lost 9 pounds in 1 month Poor appetitie Poor energy    Medical history She is a [age]ear old female who has a  history of Lung cancer , COPD Patient was diagnosed non-small cell right lung cancer in July 2016 and underwent 2 cycles of chemotherapy with Taxol and carboplatin  .   She developed  neuropathy.  And chemo was stopped.  She [facility]  [facility] OaFaith Rogue This was followed by radiation therapy to the right lung in February 2017 for 30 days.  In May 2017 a repeat CAT scan was noted as interval right lower lobe resection without evidence of local recurrence or definite metastatic disease. IShe was followed by oncology with serial CAT scans and PET scans. PET scan Feb 9th 2018- no evidence of recurrence; postoperative changes/stable right hilar lymph node without any activity noted. On 05/25/17 CT showed partial cavitary right upper lobe pulmonary nodule, most consistent with metastatic disease versus metachronous primary.  She was seen by pulmonologist Dr.Casa and underwent bronch and biopsy on  10/25/17.  Pathology report was VERY SCANT LUNG TISSUE WITH PIGMENTED MACROPHAGES. - NO NEOPLASM IS IDENTIFIED IN THIS SPECIMEN She received  SBRT  to the right lung X 5 doses-   Subsequently she was complaining of cough and productive sputum and she was treated with multiple courses of antibiotics including Levaquin.  A repeat CT scan was done in  Jan 2020 and it showed Thick-walled, partially cavitary process in the right lung apex measuring 5.4 x 7.4 cm (series 3/image 34), progressive, previously 4.2 x 2.9 cm.  Additional thick-walled cavitary process posteriorly in the right upper lobe measuring 8.1 x 5.5 cm (series 3/image 61), previously 7.7 x 5.1 cm when measured in a similar fashion.      On 10/10/2018 she underwent bronchoscopy and bronchoalveolar lavage was sent for AFB and fungal and bacterial cultures.  The pathology showed BENIGN ALVEOLATED LUNG PARENCHYMA AND BRONCHIAL WALL WITH FOCAL  ORGANIZING FIBRIN. - NO EVIDENCE OF TUMOR OR GRANULOMA.    AFB and GMS special stains were negative. The AFB nucleic acid amplification testing was done and that was positive for Mycobacterium avium intracellulare and she was referred to me in February 2020.. Marland Kitchentarted Anti MAC treatment feb 2020. Repeat Sputum in Sept had MAC and it was still susceptible to macrolide In Nov 2020 she has stopped meds because of pill fatigue and restarted Dec 2020 CT scan done in March 2021     She was a smoker until December 2019.  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 Husband died in 2016/11/27 of lung cancer as well.  Past Medical History:  Diagnosis Date  . Cancer of lower lobe of right lung ([place]) 05/07/2015  . COPD (chronic obstructive pulmonary disease) ([place])   . Dyspnea   . Non-small cell lung cancer ([place])   . Pneumonia 04/2015  . Pulmonary Mycobacterium avium complex (MAC) infection ([place]) 10/25/2018    Past Surgical History:  Procedure Laterality Date  . ABDOMINAL HYSTERECTOMY    .  COLONOSCOPY WITH PROPOFOL N/A 06/09/2018   Procedure: COLONOSCOPY WITH PROPOFOL;  Surgeon: Toledo, Benay Pike, MD;  Location: [facility] ENDOSCOPY;  Service: Gastroenterology;  Laterality: N/A;  . ELBOW SURGERY Right 1995  . ELECTROMAGNETIC NAVIGATION BROCHOSCOPY N/A 10/25/2017   Procedure: ELECTROMAGNETIC NAVIGATION BRONCHOSCOPY;  Surgeon: [name], MD;  Location: [facility] ORS;  Service: Cardiopulmonary;  Laterality: N/A;  . ESOPHAGOGASTRODUODENOSCOPY N/A 06/08/2018   Procedure: ESOPHAGOGASTRODUODENOSCOPY (EGD);  Surgeon: Toledo, Benay Pike, MD;  Location: [facility] ENDOSCOPY;  Service: Gastroenterology;  Laterality: N/A;  . FLEXIBLE BRONCHOSCOPY Right 10/10/2018   Procedure: FLEXIBLE BRONCHOSCOPY;  Surgeon: [name], MD;  Location: [facility] ORS;  Service: Cardiopulmonary;  Laterality: Right;  . PORTACATH PLACEMENT Right 05/10/2015   Procedure: INSERTION PORT-A-CATH;  Surgeon: Nestor Lewandowsky, MD;  Location: [facility] ORS;  Service: General;  Laterality: Right;  Marland Kitchen VIDEO ASSISTED THORACOSCOPY (VATS)/THOROCOTOMY Right 08/18/2015   Procedure: VIDEO ASSISTED THORACOSCOPY (VATS)/THOROCOTOMY;  Surgeon: Nestor Lewandowsky, MD;  Location: [facility] ORS;  Service: General;  Laterality: Right;    Social History   Socioeconomic History  . Marital status: Widowed    Spouse name: Not on file  . Number of children: Not on file  . Years of education: Not on file  . Highest education level: Not on file  Occupational History  . Not on file  Tobacco Use  . Smoking status: Former Smoker    Packs/day: 0.50    Years: 40.00    Pack years: 20.00    Types: Cigarettes    Quit date: 09/12/2018    Years since quitting: 1.4  . Smokeless tobacco: Never Used  Substance and Sexual Activity  . Alcohol use: Not Currently    Alcohol/week: 2.0 - 4.0 standard drinks    Types: 2 - 4 Cans of beer per week    Comment: beer-occasionally  . Drug use: No  . Sexual activity: Never  Other Topics Concern  . Not on file  Social History Narrative  . Not on  file   Social Determinants of Health   Financial Resource Strain:   . Difficulty of Paying Living Expenses:   Food Insecurity:   . Worried About Charity fundraiser in the Last Year:   . Arboriculturist in the Last Year:   Transportation Needs:   . Film/video editor (Medical):   Marland Kitchen Lack of Transportation (Non-Medical):   Physical Activity:   . Days of Exercise per Week:   . Minutes of Exercise per Session:   Stress:   . Feeling of Stress :   Social Connections:   . Frequency of Communication with Friends and Family:   . Frequency of Social Gatherings with Friends and Family:   . Attends Religious Services:   . Active Member of Clubs or Organizations:   . Attends Archivist Meetings:   Marland Kitchen Marital Status:   Intimate Partner Violence:   . Fear of Current or Ex-Partner:   . Emotionally Abused:   Marland Kitchen Physically Abused:   .  Sexually Abused:     Family History  Problem Relation Age of Onset  . Stroke Mother   . Lung cancer Father 46   Allergies  Allergen Reactions  . Keppra [Levetiracetam] Other (See Comments)    Nausea and dizzy  . Lyrica [Pregabalin] Other (See Comments)    Made patient feel very dizzy and not feel good.    Current medications  ? Current Outpatient Medications  Medication Sig Dispense Refill  . albuterol (PROVENTIL HFA;VENTOLIN HFA) 108 (90 Base) MCG/ACT inhaler Inhale 2 puffs into the lungs every 6 (six) hours as needed for wheezing or shortness of breath.     Marland Kitchen azithromycin (ZITHROMAX) 500 MG tablet Take 1 tablet (500 mg total) by mouth daily. 30 tablet 3  . benzonatate (TESSALON) 100 MG capsule TAKE 1 CAPSULE BY MOUTH THREE TIMES A DAY AS NEEDED FOR COUGH 90 capsule 3  . chlorpheniramine-HYDROcodone (TUSSIONEX) 10-8 MG/5ML SUER Take 5 mLs by mouth every 12 (twelve) hours as needed for cough. 140 mL 0  . Cholecalciferol (VITAMIN D3) 1000 units CAPS Take 2,000 Units by mouth daily.     Marland Kitchen ethambutol (MYAMBUTOL) 400 MG tablet Take 1.5 tablets  (600 mg total) by mouth daily. 45 tablet 3  . Fluticasone-Salmeterol (ADVAIR DISKUS) 250-50 MCG/DOSE AEPB Inhale 1 puff into the lungs every 12 (twelve) hours.     Marland Kitchen ipratropium-albuterol (DUONEB) 0.5-2.5 (3) MG/3ML SOLN Take 3 mLs by nebulization every 4 (four) hours as needed. 360 mL 3  . Respiratory Therapy Supplies (FLUTTER) DEVI 1 each by Does not apply route daily. 1 each 0  . rifabutin (MYCOBUTIN) 150 MG capsule Take 2 capsules (300 mg total) by mouth daily. 60 capsule 3  . SPIRIVA RESPIMAT 2.5 MCG/ACT AERS SMARTSIG:2 Inhalation Via Inhaler Daily    . traMADol (ULTRAM) 50 MG tablet Take 1 tablet (50 mg total) by mouth every 6 (six) hours as needed. 60 tablet 0  . vitamin B-12 (CYANOCOBALAMIN) 1000 MCG tablet Take 1,000 mcg by mouth daily.     No current facility-administered medications for this visit.   Facility-Administered Medications Ordered in Other Visits  Medication Dose Route Frequency Provider Last Rate Last Admin  . sodium chloride flush (NS) 0.9 % injection 10 mL  10 mL Intravenous PRN Cammie Sickle, MD         Abtx:  Anti-infectives (From admission, onward)   None      REVIEW OF SYSTEMS:  As above Rest of the system neg Objective:  VITALS:  BP 131/69   Pulse 97   Temp 98 F (36.7 C) (Temporal)   Resp 16   Ht _0  (1.6 m)   Wt 90 lb (40.8 kg)   SpO2 97%   BMI 15.94 kg/m    PHYSICAL EXAM:  General: Alert, cooperative, some distress because of persistent coughing.  Skin Head: Normocephalic, without obvious abnormality, atraumatic. Eyes: Conjunctivae clear, anicteric sclerae. Pupils are equal ENT Nares normal. No drainage or sinus tenderness. Lips, mucosa, and tongue normal. No Thrush Neck: Supple, symmetrical, no adenopathy, thyroid: non tender no carotid bruit and no JVD. Back: No CVA tenderness. Lungs: Bilateral air entry crepitations present on the right side Heart: S1-S2 Abdomen: Soft, non-tender,not distended. Bowel sounds normal. No  masses Extremities: atraumatic, no cyanosis. No edema. No clubbing Skin: No rashes or lesions. Or bruising Lymph: Cervical, supraclavicular normal. Neurologic: Grossly non-focal Pertinent Labs Lab Results CBC    Component Value Date/Time   WBC 8.8 12/09/2019 1015   RBC 3.51 (  L) 12/09/2019 1015   HGB 10.3 (L) 12/09/2019 1015   HCT 32.3 (L) 12/09/2019 1015   PLT 509 (H) 12/09/2019 1015   MCV 92.0 12/09/2019 1015   MCH 29.3 12/09/2019 1015   MCHC 31.9 12/09/2019 1015   RDW 16.6 (H) 12/09/2019 1015   LYMPHSABS 1.2 12/09/2019 1015   MONOABS 0.6 12/09/2019 1015   EOSABS 0.2 12/09/2019 1015   BASOSABS 0.1 12/09/2019 1015    CMP Latest Ref Rng & Units 12/09/2019 12/08/2019 09/09/2019  Glucose 70 - 99 mg/dL 102(H) - 111(H)  BUN 8 - 23 mg/dL 9 - 9  Creatinine 0.44 - 1.00 mg/dL 0.42(L) 0.50 0.52  Sodium 135 - 145 mmol/L 129(L) - 132(L)  Potassium 3.5 - 5.1 mmol/L 3.6 - 3.8  Chloride 98 - 111 mmol/L 98 - 98  CO2 22 - 32 mmol/L 22 - 22  Calcium 8.9 - 10.3 mg/dL 8.6(L) - 8.7(L)  Total Protein 6.5 - 8.1 g/dL 7.6 - 7.6  Total Bilirubin 0.3 - 1.2 mg/dL 0.5 - 0.4  Alkaline Phos 38 - 126 U/L 138(H) - 176(H)  AST 15 - 41 U/L 16 - 17  ALT 0 - 44 U/L 13 - 10      Microbiology: 10/10/2018 acid-fast organism ID by nucleic acid amplification test is M avium complex.     Sept 2020     Impression/Recommendation 64 y.o.female  with a history of Lung cancer , COPD   Fibrocavitary  lesions in the right lung.  Pulmonary MAC Severe infection Worsening - on triple antibiotic therapy Repeat sputum MC from 01/01/20 and susceptibility pending   She is on 3 drug combination. On  azithromycin  500 mg once a day, ethambutol 46m/kg body weight (6031m and rifabutin  300 mg a day since February 2020.. Marland Kitchen  She was 100% adherent until she stopped in NOV 2020 and restarted Dec 2020 Three sputums repeated inSeptember still had MAC . Susceptible to clarithromycin- so continuing the same regimen- In April  2021 the sputum still has MAC Because of worsening symptoms and persistent bacteria will start amikacin IV 2558mg M/W/F-  [facility] will start 1000m61mill check Amikacin level peak, ( 1hr after administration- goal 65-80 Trough prior to infusion goal < 4 Monday and Friday CBC /CMP on Monday BMP on friday  will continue azithromycin, rifabutin and ethambutol until the last culture susceptibility is available   Anemia which had necessitated blood transfusion in the past has been stable and hemoglobin is 9.8  Right lung carcinoma.  Status post right lower lobectomy, radiation, recurrence, SBRT.  Followed by Dr. BramYevette Edwards Dr. KasaMortimer Friesrepeat CAT done in  March 2021 shows progression in the rt lower lobe cavity . Also has Numerous bilateral pulmonary nodules throughout the lungs bilaterally, stable versus mildly progressive. On observation by Dr.Brahmandy  COPD on inhalers and nebulizers Smoker: Quit smoking December 2019    ___________________________________________________ Discussed the management in great detail with the patient and Dr.Bramandy and Dr.Kasa Pt is getting audiology screening today and vestibular testing in 2 weeks by Dr.yuengel. Day surgery will be doing her infusion and labs thru her PORT      Day surgery orders Diagnosis: Pulmonary MAC infection- cavitary and nodular infiltrates Baseline Creatinine <1        Allergies  Allergen Reactions  . Keppra [Levetiracetam] Other (See Comments)    Nausea and dizzy  . Lyrica [Pregabalin] Other (See Comments)    Made patient feel very dizzy and not feel good.  OPAT Orders Amikacin 1068m IVPB every Monday/Wednesday and Friday Adjust dose according to amikacin levels Will check Amikacin level peak, ( 1hr after administration-) goal 65-80 Trough prior to infusion goal < 4  Labs Monday and Friday Amikacin peak and trough CBC /CMP on Monday BMP on friday  Duration: 12 weeks ( may need  more 05/08/20  PORT care   Call [phone] [phone] any questions

## 2020-02-06 ENCOUNTER — Ambulatory Visit: Payer: PPO

## 2020-02-06 ENCOUNTER — Ambulatory Visit
Admission: RE | Admit: 2020-02-06 | Discharge: 2020-02-06 | Disposition: A | Discharge status: Home / Self Care | Payer: PPO | Source: Ambulatory Visit | Attending: Infectious Diseases | Admitting: Infectious Diseases

## 2020-02-06 DIAGNOSIS — A310 Pulmonary mycobacterial infection: Secondary | ICD-10-CM | POA: Insufficient documentation

## 2020-02-06 LAB — BASIC METABOLIC PANEL
Anion gap: 10 (ref 5–15)
BUN: 8 mg/dL (ref 8–23)
CO2: 25 mmol/L (ref 22–32)
Calcium: 8.7 mg/dL — ABNORMAL LOW (ref 8.9–10.3)
Chloride: 95 mmol/L — ABNORMAL LOW (ref 98–111)
Creatinine, Ser: 0.54 mg/dL (ref 0.44–1.00)
GFR calc Af Amer: >60 mL/min (ref >60)
GFR calc non Af Amer: >60 mL/min (ref >60)
Glucose, Bld: 100 mg/dL — ABNORMAL HIGH (ref 70–99)
Potassium: 4.1 mmol/L (ref 3.5–5.1)
Sodium: 130 mmol/L — ABNORMAL LOW (ref 135–145)

## 2020-02-06 MED ORDER — SODIUM CHLORIDE FLUSH 0.9 % IV SOLN
INTRAVENOUS | Status: AC
  Filled 2020-02-06: qty 10

## 2020-02-06 MED ORDER — HEPARIN SOD (PORK) LOCK FLUSH 100 UNIT/ML IV SOLN
INTRAVENOUS | Status: AC
  Filled 2020-02-06: qty 5

## 2020-02-06 MED ORDER — DEXTROSE 5 % IV SOLN
1000.0000 mg | Freq: Once | INTRAVENOUS | Status: AC
  Administered 2020-02-06: 1000 mg via INTRAVENOUS
  Filled 2020-02-06: qty 4

## 2020-02-06 MED ORDER — SODIUM CHLORIDE 0.9 % IV SOLN: INTRAVENOUS | Status: DC

## 2020-02-06 MED ORDER — HEPARIN SOD (PORK) LOCK FLUSH 100 UNIT/ML IV SOLN: 500.0000 [IU] | Freq: Once | INTRAVENOUS | Status: DC

## 2020-02-06 NOTE — OR Nursing (Signed)
Patient arrived SOB after ambulating from car. SOB resolved quickly after sitting. She reports this has been consistent. She has a loose non productive cough. She reports Dr. Delaine Lame has instructed her to get an appointment with Dr. Mortimer Fries re; her SOB. Patient reports she will do this today.

## 2020-02-09 ENCOUNTER — Other Ambulatory Visit: Payer: Self-pay

## 2020-02-09 ENCOUNTER — Ambulatory Visit
Admission: RE | Admit: 2020-02-09 | Discharge: 2020-02-09 | Disposition: A | Discharge status: Home / Self Care | Payer: PPO | Source: Ambulatory Visit | Attending: Infectious Diseases | Admitting: Infectious Diseases

## 2020-02-09 ENCOUNTER — Ambulatory Visit: Payer: PPO

## 2020-02-09 DIAGNOSIS — Z79899 Other long term (current) drug therapy: Secondary | ICD-10-CM | POA: Diagnosis not present

## 2020-02-09 DIAGNOSIS — Z923 Personal history of irradiation: Secondary | ICD-10-CM | POA: Diagnosis not present

## 2020-02-09 DIAGNOSIS — Z85118 Personal history of other malignant neoplasm of bronchus and lung: Secondary | ICD-10-CM | POA: Diagnosis not present

## 2020-02-09 DIAGNOSIS — A310 Pulmonary mycobacterial infection: Secondary | ICD-10-CM | POA: Insufficient documentation

## 2020-02-09 DIAGNOSIS — Z902 Acquired absence of lung [part of]: Secondary | ICD-10-CM | POA: Insufficient documentation

## 2020-02-09 DIAGNOSIS — J449 Chronic obstructive pulmonary disease, unspecified: Secondary | ICD-10-CM | POA: Diagnosis not present

## 2020-02-09 LAB — COMPREHENSIVE METABOLIC PANEL
ALT: 10 U/L (ref 0–44)
AST: 13 U/L — ABNORMAL LOW (ref 15–41)
Albumin: 2.8 g/dL — ABNORMAL LOW (ref 3.5–5.0)
Alkaline Phosphatase: 174 U/L — ABNORMAL HIGH (ref 38–126)
Anion gap: 9 (ref 5–15)
BUN: 10 mg/dL (ref 8–23)
CO2: 25 mmol/L (ref 22–32)
Calcium: 8.7 mg/dL — ABNORMAL LOW (ref 8.9–10.3)
Chloride: 96 mmol/L — ABNORMAL LOW (ref 98–111)
Creatinine, Ser: 0.6 mg/dL (ref 0.44–1.00)
GFR calc Af Amer: >60 mL/min (ref >60)
GFR calc non Af Amer: >60 mL/min (ref >60)
Glucose, Bld: 106 mg/dL — ABNORMAL HIGH (ref 70–99)
Potassium: 3.8 mmol/L (ref 3.5–5.1)
Sodium: 130 mmol/L — ABNORMAL LOW (ref 135–145)
Total Bilirubin: 0.4 mg/dL (ref 0.3–1.2)
Total Protein: 7.4 g/dL (ref 6.5–8.1)

## 2020-02-09 LAB — CBC
HCT: 30.7 % — ABNORMAL LOW (ref 36.0–46.0)
Hemoglobin: 10.2 g/dL — ABNORMAL LOW (ref 12.0–15.0)
MCH: 29.1 pg (ref 26.0–34.0)
MCHC: 33.2 g/dL (ref 30.0–36.0)
MCV: 87.5 fL (ref 80.0–100.0)
Platelets: 628 10*3/uL — ABNORMAL HIGH (ref 150–400)
RBC: 3.51 MIL/uL — ABNORMAL LOW (ref 3.87–5.11)
RDW: 14.6 % (ref 11.5–15.5)
WBC: 11.2 10*3/uL — ABNORMAL HIGH (ref 4.0–10.5)
nRBC: 0 % (ref 0.0–0.2)

## 2020-02-09 MED ORDER — DEXTROSE 5 % IV SOLN
1000.0000 mg | Freq: Once | INTRAVENOUS | Status: AC
  Administered 2020-02-09: 09:00:00 1000 mg via INTRAVENOUS
  Filled 2020-02-09: qty 4

## 2020-02-09 MED ORDER — SODIUM CHLORIDE 0.9 % IV SOLN: INTRAVENOUS | Status: DC | PRN

## 2020-02-09 MED ORDER — HEPARIN SOD (PORK) LOCK FLUSH 100 UNIT/ML IV SOLN: 500.0000 [IU] | INTRAVENOUS | Status: AC | PRN

## 2020-02-09 MED ORDER — HEPARIN SOD (PORK) LOCK FLUSH 100 UNIT/ML IV SOLN
INTRAVENOUS | Status: AC
  Administered 2020-02-09: 12:00:00 500 [IU]
  Filled 2020-02-09: qty 5

## 2020-02-10 LAB — MISC LABCORP TEST (SEND OUT): Labcorp test code: 7205

## 2020-02-10 LAB — AMIKACIN, PEAK: Amikacin Pk: <0.8 ug/mL — ABNORMAL LOW (ref 20.0–30.0)

## 2020-02-11 ENCOUNTER — Other Ambulatory Visit: Payer: Self-pay

## 2020-02-11 ENCOUNTER — Ambulatory Visit
Admission: RE | Admit: 2020-02-11 | Discharge: 2020-02-11 | Disposition: A | Discharge status: Home / Self Care | Payer: PPO | Source: Ambulatory Visit | Attending: Infectious Diseases | Admitting: Infectious Diseases

## 2020-02-11 DIAGNOSIS — A310 Pulmonary mycobacterial infection: Secondary | ICD-10-CM | POA: Insufficient documentation

## 2020-02-11 MED ORDER — DEXTROSE 5 % IV SOLN
1000.0000 mg | Freq: Once | INTRAVENOUS | Status: AC
  Administered 2020-02-11: 1000 mg via INTRAVENOUS
  Filled 2020-02-11: qty 4

## 2020-02-11 MED ORDER — HEPARIN SOD (PORK) LOCK FLUSH 100 UNIT/ML IV SOLN: 500.0000 [IU] | INTRAVENOUS | Status: DC | PRN

## 2020-02-11 MED ORDER — HEPARIN SOD (PORK) LOCK FLUSH 100 UNIT/ML IV SOLN
500.0000 [IU] | INTRAVENOUS | Status: DC
  Administered 2020-02-11: 500 [IU]

## 2020-02-13 ENCOUNTER — Ambulatory Visit
Admission: RE | Admit: 2020-02-13 | Discharge: 2020-02-13 | Disposition: A | Discharge status: Home / Self Care | Payer: PPO | Source: Ambulatory Visit | Attending: Infectious Diseases | Admitting: Infectious Diseases

## 2020-02-13 ENCOUNTER — Other Ambulatory Visit: Payer: Self-pay

## 2020-02-13 DIAGNOSIS — A310 Pulmonary mycobacterial infection: Secondary | ICD-10-CM | POA: Insufficient documentation

## 2020-02-13 LAB — BASIC METABOLIC PANEL
Anion gap: 11 (ref 5–15)
BUN: 8 mg/dL (ref 8–23)
CO2: 25 mmol/L (ref 22–32)
Calcium: 8.6 mg/dL — ABNORMAL LOW (ref 8.9–10.3)
Chloride: 96 mmol/L — ABNORMAL LOW (ref 98–111)
Creatinine, Ser: 0.51 mg/dL (ref 0.44–1.00)
GFR calc Af Amer: >60 mL/min (ref >60)
GFR calc non Af Amer: >60 mL/min (ref >60)
Glucose, Bld: 134 mg/dL — ABNORMAL HIGH (ref 70–99)
Potassium: 3.7 mmol/L (ref 3.5–5.1)
Sodium: 132 mmol/L — ABNORMAL LOW (ref 135–145)

## 2020-02-13 MED ORDER — SODIUM CHLORIDE 0.9% FLUSH: 10.0000 mL | Freq: Once | INTRAVENOUS | Status: DC

## 2020-02-13 MED ORDER — DEXTROSE 5 % IV SOLN
1000.0000 mg | Freq: Once | INTRAVENOUS | Status: AC
  Administered 2020-02-13: 1000 mg via INTRAVENOUS
  Filled 2020-02-13: qty 4

## 2020-02-13 MED ORDER — HEPARIN SOD (PORK) LOCK FLUSH 100 UNIT/ML IV SOLN: 500.0000 [IU] | Freq: Once | INTRAVENOUS | Status: DC

## 2020-02-16 ENCOUNTER — Other Ambulatory Visit: Payer: Self-pay

## 2020-02-16 ENCOUNTER — Ambulatory Visit
Admission: RE | Admit: 2020-02-16 | Discharge: 2020-02-16 | Disposition: A | Discharge status: Home / Self Care | Payer: PPO | Source: Ambulatory Visit | Attending: Infectious Diseases | Admitting: Infectious Diseases

## 2020-02-16 DIAGNOSIS — A310 Pulmonary mycobacterial infection: Secondary | ICD-10-CM | POA: Diagnosis not present

## 2020-02-16 LAB — CBC
HCT: 32 % — ABNORMAL LOW (ref 36.0–46.0)
Hemoglobin: 10.4 g/dL — ABNORMAL LOW (ref 12.0–15.0)
MCH: 28.8 pg (ref 26.0–34.0)
MCHC: 32.5 g/dL (ref 30.0–36.0)
MCV: 88.6 fL (ref 80.0–100.0)
Platelets: 595 10*3/uL — ABNORMAL HIGH (ref 150–400)
RBC: 3.61 MIL/uL — ABNORMAL LOW (ref 3.87–5.11)
RDW: 14.8 % (ref 11.5–15.5)
WBC: 12 10*3/uL — ABNORMAL HIGH (ref 4.0–10.5)
nRBC: 0 % (ref 0.0–0.2)

## 2020-02-16 LAB — COMPREHENSIVE METABOLIC PANEL
ALT: 9 U/L (ref 0–44)
AST: 13 U/L — ABNORMAL LOW (ref 15–41)
Albumin: 2.7 g/dL — ABNORMAL LOW (ref 3.5–5.0)
Alkaline Phosphatase: 149 U/L — ABNORMAL HIGH (ref 38–126)
Anion gap: 11 (ref 5–15)
BUN: 9 mg/dL (ref 8–23)
CO2: 25 mmol/L (ref 22–32)
Calcium: 8.7 mg/dL — ABNORMAL LOW (ref 8.9–10.3)
Chloride: 94 mmol/L — ABNORMAL LOW (ref 98–111)
Creatinine, Ser: 0.42 mg/dL — ABNORMAL LOW (ref 0.44–1.00)
GFR calc Af Amer: >60 mL/min (ref >60)
GFR calc non Af Amer: >60 mL/min (ref >60)
Glucose, Bld: 109 mg/dL — ABNORMAL HIGH (ref 70–99)
Potassium: 4.4 mmol/L (ref 3.5–5.1)
Sodium: 130 mmol/L — ABNORMAL LOW (ref 135–145)
Total Bilirubin: 0.5 mg/dL (ref 0.3–1.2)
Total Protein: 7.4 g/dL (ref 6.5–8.1)

## 2020-02-16 MED ORDER — HEPARIN SOD (PORK) LOCK FLUSH 100 UNIT/ML IV SOLN
INTRAVENOUS | Status: AC
  Administered 2020-02-16: 500 [IU]
  Filled 2020-02-16: qty 5

## 2020-02-16 MED ORDER — DEXTROSE 5 % IV SOLN
1000.0000 mg | Freq: Once | INTRAVENOUS | Status: AC
  Administered 2020-02-16: 1000 mg via INTRAVENOUS
  Filled 2020-02-16: qty 4

## 2020-02-16 NOTE — Telephone Encounter (Signed)
error 

## 2020-02-17 LAB — AMIKACIN, TROUGH: Amikacin Tr: <0.8 ug/mL — ABNORMAL LOW (ref 1.0–8.0)

## 2020-02-17 LAB — AMIKACIN, PEAK: Amikacin Pk: 50.8 ug/mL — ABNORMAL HIGH (ref 20.0–30.0)

## 2020-02-18 ENCOUNTER — Other Ambulatory Visit: Payer: Self-pay

## 2020-02-18 ENCOUNTER — Ambulatory Visit
Admission: RE | Admit: 2020-02-18 | Discharge: 2020-02-18 | Disposition: A | Discharge status: Home / Self Care | Payer: PPO | Source: Ambulatory Visit | Attending: Infectious Diseases | Admitting: Infectious Diseases

## 2020-02-18 DIAGNOSIS — A310 Pulmonary mycobacterial infection: Secondary | ICD-10-CM | POA: Insufficient documentation

## 2020-02-18 LAB — ACID FAST CULTURE WITH REFLEXED SENSITIVITIES (MYCOBACTERIA): Acid Fast Culture: POSITIVE — AB

## 2020-02-18 LAB — MAC SUSCEPTIBILITY BROTH
Amikacin: 8
Clarithromycin: >64
Linezolid: 32
Streptomycin: 16

## 2020-02-18 LAB — AFB ORGANISM ID BY DNA PROBE
M avium complex: 15 — AB
M tuberculosis complex: NEGATIVE

## 2020-02-18 MED ORDER — HEPARIN SOD (PORK) LOCK FLUSH 100 UNIT/ML IV SOLN
500.0000 [IU] | INTRAVENOUS | Status: AC | PRN
  Administered 2020-02-18: 500 [IU]

## 2020-02-18 MED ORDER — DEXTROSE 5 % IV SOLN
1000.0000 mg | INTRAVENOUS | Status: DC
  Administered 2020-02-18: 1000 mg via INTRAVENOUS
  Filled 2020-02-18 (×2): qty 4

## 2020-02-20 ENCOUNTER — Ambulatory Visit
Admission: RE | Admit: 2020-02-20 | Discharge: 2020-02-20 | Disposition: A | Discharge status: Home / Self Care | Payer: PPO | Source: Ambulatory Visit | Attending: Infectious Diseases | Admitting: Infectious Diseases

## 2020-02-20 ENCOUNTER — Other Ambulatory Visit: Payer: Self-pay

## 2020-02-20 DIAGNOSIS — A310 Pulmonary mycobacterial infection: Secondary | ICD-10-CM | POA: Insufficient documentation

## 2020-02-20 LAB — BASIC METABOLIC PANEL
Anion gap: 10 (ref 5–15)
BUN: 7 mg/dL — ABNORMAL LOW (ref 8–23)
CO2: 25 mmol/L (ref 22–32)
Calcium: 8.6 mg/dL — ABNORMAL LOW (ref 8.9–10.3)
Chloride: 94 mmol/L — ABNORMAL LOW (ref 98–111)
Creatinine, Ser: 0.57 mg/dL (ref 0.44–1.00)
GFR calc Af Amer: >60 mL/min (ref >60)
GFR calc non Af Amer: >60 mL/min (ref >60)
Glucose, Bld: 132 mg/dL — ABNORMAL HIGH (ref 70–99)
Potassium: 4.4 mmol/L (ref 3.5–5.1)
Sodium: 129 mmol/L — ABNORMAL LOW (ref 135–145)

## 2020-02-20 MED ORDER — DEXTROSE 5 % IV SOLN
1250.0000 mg | INTRAVENOUS | Status: DC
  Administered 2020-02-20: 1250 mg via INTRAVENOUS
  Filled 2020-02-20: qty 5

## 2020-02-20 MED ORDER — HEPARIN SOD (PORK) LOCK FLUSH 100 UNIT/ML IV SOLN: 500.0000 [IU] | INTRAVENOUS | Status: DC | PRN

## 2020-02-20 MED ORDER — HEPARIN SOD (PORK) LOCK FLUSH 100 UNIT/ML IV SOLN
INTRAVENOUS | Status: AC
  Administered 2020-02-20: 500 [IU]
  Filled 2020-02-20: qty 5

## 2020-02-20 NOTE — Progress Notes (Addendum)
Pharmacy - Antimicrobial Stewardship   Patient on Amikacin 1027m MWF as part of her MAC regimen.  5/17 Peak = 50.8 mcg/mL and Trough < 0.8  Scr is stable, WNL  For Thrice weekly dosing, peak goal can be 65-80 mcg/mL  Plan:  Increase Amikacin to 12538mIV MWF to achieve a goal peak at lower end of above range.  Goal trough = undetectable (<0.8)  Monitor renal function and levels as prescribed by physician  DuDoreene ElandPharmD, BCPS.   Work Cell: [phone]/21/2021 8:14 AM

## 2020-02-23 ENCOUNTER — Ambulatory Visit
Admission: RE | Admit: 2020-02-23 | Discharge: 2020-02-23 | Disposition: A | Discharge status: Home / Self Care | Payer: PPO | Source: Ambulatory Visit | Attending: Infectious Diseases | Admitting: Infectious Diseases

## 2020-02-23 DIAGNOSIS — A310 Pulmonary mycobacterial infection: Secondary | ICD-10-CM | POA: Insufficient documentation

## 2020-02-23 LAB — CBC WITH DIFFERENTIAL/PLATELET
Abs Immature Granulocytes: 0.05 10*3/uL (ref 0.00–0.07)
Basophils Absolute: 0.1 10*3/uL (ref 0.0–0.1)
Basophils Relative: 1 %
Eosinophils Absolute: 0.2 10*3/uL (ref 0.0–0.5)
Eosinophils Relative: 2 %
HCT: 32.8 % — ABNORMAL LOW (ref 36.0–46.0)
Hemoglobin: 10.8 g/dL — ABNORMAL LOW (ref 12.0–15.0)
Immature Granulocytes: 1 %
Lymphocytes Relative: 7 %
Lymphs Abs: 0.7 10*3/uL (ref 0.7–4.0)
MCH: 28.2 pg (ref 26.0–34.0)
MCHC: 32.9 g/dL (ref 30.0–36.0)
MCV: 85.6 fL (ref 80.0–100.0)
Monocytes Absolute: 0.8 10*3/uL (ref 0.1–1.0)
Monocytes Relative: 8 %
Neutro Abs: 8 10*3/uL — ABNORMAL HIGH (ref 1.7–7.7)
Neutrophils Relative %: 81 %
Platelets: 649 10*3/uL — ABNORMAL HIGH (ref 150–400)
RBC: 3.83 MIL/uL — ABNORMAL LOW (ref 3.87–5.11)
RDW: 15.2 % (ref 11.5–15.5)
WBC: 9.9 10*3/uL (ref 4.0–10.5)
nRBC: 0 % (ref 0.0–0.2)

## 2020-02-23 LAB — BASIC METABOLIC PANEL
Anion gap: 12 (ref 5–15)
BUN: 8 mg/dL (ref 8–23)
CO2: 24 mmol/L (ref 22–32)
Calcium: 8.6 mg/dL — ABNORMAL LOW (ref 8.9–10.3)
Chloride: 93 mmol/L — ABNORMAL LOW (ref 98–111)
Creatinine, Ser: 0.46 mg/dL (ref 0.44–1.00)
GFR calc Af Amer: >60 mL/min (ref >60)
GFR calc non Af Amer: >60 mL/min (ref >60)
Glucose, Bld: 146 mg/dL — ABNORMAL HIGH (ref 70–99)
Potassium: 3.9 mmol/L (ref 3.5–5.1)
Sodium: 129 mmol/L — ABNORMAL LOW (ref 135–145)

## 2020-02-23 MED ORDER — DEXTROSE 5 % IV SOLN
1250.0000 mg | Freq: Once | INTRAVENOUS | Status: DC
  Filled 2020-02-23: qty 5

## 2020-02-23 MED ORDER — HEPARIN SOD (PORK) LOCK FLUSH 100 UNIT/ML IV SOLN
INTRAVENOUS | Status: AC
  Filled 2020-02-23: qty 5

## 2020-02-23 MED ORDER — HEPARIN SOD (PORK) LOCK FLUSH 100 UNIT/ML IV SOLN
500.0000 [IU] | INTRAVENOUS | Status: AC | PRN
  Administered 2020-02-23: 500 [IU]

## 2020-02-24 LAB — AMIKACIN, PEAK: Amikacin Pk: 61.8 ug/mL — ABNORMAL HIGH (ref 20.0–30.0)

## 2020-02-24 LAB — AMIKACIN, TROUGH: Amikacin Tr: <0.8 ug/mL — ABNORMAL LOW (ref 1.0–8.0)

## 2020-02-25 ENCOUNTER — Ambulatory Visit
Admission: RE | Admit: 2020-02-25 | Discharge: 2020-02-25 | Disposition: A | Discharge status: Home / Self Care | Payer: PPO | Source: Ambulatory Visit | Attending: Infectious Diseases | Admitting: Infectious Diseases

## 2020-02-25 DIAGNOSIS — A310 Pulmonary mycobacterial infection: Secondary | ICD-10-CM | POA: Diagnosis not present

## 2020-02-25 MED ORDER — HEPARIN SOD (PORK) LOCK FLUSH 100 UNIT/ML IV SOLN: 500.0000 [IU] | Freq: Once | INTRAVENOUS | Status: AC

## 2020-02-25 MED ORDER — DEXTROSE 5 % IV SOLN
1250.0000 mg | Freq: Once | INTRAVENOUS | Status: AC
  Administered 2020-02-25: 1250 mg via INTRAVENOUS
  Filled 2020-02-25: qty 5

## 2020-02-25 MED ORDER — HEPARIN SOD (PORK) LOCK FLUSH 100 UNIT/ML IV SOLN
INTRAVENOUS | Status: AC
  Administered 2020-02-25: 500 [IU]
  Filled 2020-02-25: qty 5

## 2020-02-25 MED ORDER — HEPARIN SOD (PORK) LOCK FLUSH 100 UNIT/ML IV SOLN: 500.0000 [IU] | INTRAVENOUS | Status: DC | PRN

## 2020-02-27 ENCOUNTER — Ambulatory Visit
Admission: RE | Admit: 2020-02-27 | Discharge: 2020-02-27 | Disposition: A | Discharge status: Home / Self Care | Payer: PPO | Source: Ambulatory Visit | Attending: Infectious Diseases | Admitting: Infectious Diseases

## 2020-02-27 ENCOUNTER — Other Ambulatory Visit: Payer: Self-pay

## 2020-02-27 DIAGNOSIS — A310 Pulmonary mycobacterial infection: Secondary | ICD-10-CM | POA: Insufficient documentation

## 2020-02-27 LAB — BASIC METABOLIC PANEL
Anion gap: 10 (ref 5–15)
BUN: 7 mg/dL — ABNORMAL LOW (ref 8–23)
CO2: 25 mmol/L (ref 22–32)
Calcium: 8.5 mg/dL — ABNORMAL LOW (ref 8.9–10.3)
Chloride: 95 mmol/L — ABNORMAL LOW (ref 98–111)
Creatinine, Ser: 0.5 mg/dL (ref 0.44–1.00)
GFR calc Af Amer: >60 mL/min (ref >60)
GFR calc non Af Amer: >60 mL/min (ref >60)
Glucose, Bld: 114 mg/dL — ABNORMAL HIGH (ref 70–99)
Potassium: 3.7 mmol/L (ref 3.5–5.1)
Sodium: 130 mmol/L — ABNORMAL LOW (ref 135–145)

## 2020-02-27 MED ORDER — HEPARIN SOD (PORK) LOCK FLUSH 100 UNIT/ML IV SOLN
INTRAVENOUS | Status: AC
  Administered 2020-02-27: 500 [IU]
  Filled 2020-02-27: qty 5

## 2020-02-27 MED ORDER — DEXTROSE 5 % IV SOLN
1250.0000 mg | INTRAVENOUS | Status: DC
  Administered 2020-02-27: 1250 mg via INTRAVENOUS
  Filled 2020-02-27: qty 5

## 2020-02-27 MED ORDER — DEXTROSE 5 % IV SOLN
1250.0000 mg | INTRAVENOUS | Status: DC
  Filled 2020-02-27 (×2): qty 5

## 2020-02-27 MED ORDER — HEPARIN SOD (PORK) LOCK FLUSH 100 UNIT/ML IV SOLN: 500.0000 [IU] | INTRAVENOUS | Status: DC | PRN

## 2020-02-29 ENCOUNTER — Ambulatory Visit
Admission: RE | Admit: 2020-02-29 | Discharge: 2020-02-29 | Disposition: A | Discharge status: Home / Self Care | Payer: PPO | Source: Ambulatory Visit | Attending: Infectious Diseases | Admitting: Infectious Diseases

## 2020-02-29 DIAGNOSIS — Z95828 Presence of other vascular implants and grafts: Secondary | ICD-10-CM | POA: Diagnosis not present

## 2020-02-29 LAB — CBC
HCT: 31.5 % — ABNORMAL LOW (ref 36.0–46.0)
Hemoglobin: 10.3 g/dL — ABNORMAL LOW (ref 12.0–15.0)
MCH: 28.9 pg (ref 26.0–34.0)
MCHC: 32.7 g/dL (ref 30.0–36.0)
MCV: 88.2 fL (ref 80.0–100.0)
Platelets: 662 10*3/uL — ABNORMAL HIGH (ref 150–400)
RBC: 3.57 MIL/uL — ABNORMAL LOW (ref 3.87–5.11)
RDW: 15.1 % (ref 11.5–15.5)
WBC: 9.9 10*3/uL (ref 4.0–10.5)
nRBC: 0 % (ref 0.0–0.2)

## 2020-02-29 LAB — BASIC METABOLIC PANEL
Anion gap: 11 (ref 5–15)
BUN: 8 mg/dL (ref 8–23)
CO2: 27 mmol/L (ref 22–32)
Calcium: 8.8 mg/dL — ABNORMAL LOW (ref 8.9–10.3)
Chloride: 94 mmol/L — ABNORMAL LOW (ref 98–111)
Creatinine, Ser: 0.45 mg/dL (ref 0.44–1.00)
GFR calc Af Amer: >60 mL/min (ref >60)
GFR calc non Af Amer: >60 mL/min (ref >60)
Glucose, Bld: 107 mg/dL — ABNORMAL HIGH (ref 70–99)
Potassium: 3.8 mmol/L (ref 3.5–5.1)
Sodium: 132 mmol/L — ABNORMAL LOW (ref 135–145)

## 2020-02-29 MED ORDER — DEXTROSE 5 % IV SOLN
1250.0000 mg | Freq: Once | INTRAVENOUS | Status: AC
  Administered 2020-02-29: 1250 mg via INTRAVENOUS
  Filled 2020-02-29: qty 5

## 2020-03-01 ENCOUNTER — Ambulatory Visit: Payer: PPO

## 2020-03-03 ENCOUNTER — Ambulatory Visit
Admission: RE | Admit: 2020-03-03 | Discharge: 2020-03-03 | Disposition: A | Discharge status: Home / Self Care | Payer: PPO | Source: Ambulatory Visit | Attending: Infectious Diseases | Admitting: Infectious Diseases

## 2020-03-03 DIAGNOSIS — A310 Pulmonary mycobacterial infection: Secondary | ICD-10-CM | POA: Insufficient documentation

## 2020-03-03 LAB — AMIKACIN, PEAK: Amikacin Pk: 53.2 ug/mL — ABNORMAL HIGH (ref 20.0–30.0)

## 2020-03-03 LAB — AMIKACIN, TROUGH: Amikacin Tr: <0.8 ug/mL — ABNORMAL LOW (ref 1.0–8.0)

## 2020-03-03 MED ORDER — HEPARIN SOD (PORK) LOCK FLUSH 100 UNIT/ML IV SOLN
INTRAVENOUS | Status: AC
  Filled 2020-03-03: qty 5

## 2020-03-03 MED ORDER — ALBUTEROL SULFATE (2.5 MG/3ML) 0.083% IN NEBU
2.5000 mg | INHALATION_SOLUTION | Freq: Once | RESPIRATORY_TRACT | Status: AC
  Administered 2020-03-03: 2.5 mg via RESPIRATORY_TRACT
  Filled 2020-03-03: qty 3

## 2020-03-03 MED ORDER — DEXTROSE 5 % IV SOLN
1250.0000 mg | Freq: Once | INTRAVENOUS | Status: DC
  Filled 2020-03-03: qty 5

## 2020-03-03 NOTE — OR Nursing (Signed)
Pt. States her breathing has improved post albuterol treatment.

## 2020-03-05 ENCOUNTER — Ambulatory Visit
Admission: RE | Admit: 2020-03-05 | Discharge: 2020-03-05 | Disposition: A | Discharge status: Home / Self Care | Payer: PPO | Source: Ambulatory Visit | Attending: Infectious Diseases | Admitting: Infectious Diseases

## 2020-03-05 ENCOUNTER — Other Ambulatory Visit: Payer: Self-pay

## 2020-03-05 DIAGNOSIS — A310 Pulmonary mycobacterial infection: Secondary | ICD-10-CM | POA: Insufficient documentation

## 2020-03-05 LAB — BASIC METABOLIC PANEL
Anion gap: 12 (ref 5–15)
BUN: 9 mg/dL (ref 8–23)
CO2: 25 mmol/L (ref 22–32)
Calcium: 8.4 mg/dL — ABNORMAL LOW (ref 8.9–10.3)
Chloride: 93 mmol/L — ABNORMAL LOW (ref 98–111)
Creatinine, Ser: 0.65 mg/dL (ref 0.44–1.00)
GFR calc Af Amer: >60 mL/min (ref >60)
GFR calc non Af Amer: >60 mL/min (ref >60)
Glucose, Bld: 120 mg/dL — ABNORMAL HIGH (ref 70–99)
Potassium: 3.1 mmol/L — ABNORMAL LOW (ref 3.5–5.1)
Sodium: 130 mmol/L — ABNORMAL LOW (ref 135–145)

## 2020-03-05 MED ORDER — SODIUM CHLORIDE 0.9 % IV SOLN: INTRAVENOUS | Status: DC

## 2020-03-05 MED ORDER — HEPARIN SOD (PORK) LOCK FLUSH 100 UNIT/ML IV SOLN: 500.0000 [IU] | Freq: Once | INTRAVENOUS | Status: AC

## 2020-03-05 MED ORDER — HEPARIN SOD (PORK) LOCK FLUSH 100 UNIT/ML IV SOLN
INTRAVENOUS | Status: AC
  Administered 2020-03-05: 500 [IU] via INTRAVENOUS
  Filled 2020-03-05: qty 5

## 2020-03-05 MED ORDER — DEXTROSE 5 % IV SOLN
1250.0000 mg | Freq: Once | INTRAVENOUS | Status: AC
  Administered 2020-03-05: 1250 mg via INTRAVENOUS
  Filled 2020-03-05: qty 5

## 2020-03-08 ENCOUNTER — Ambulatory Visit
Admission: RE | Admit: 2020-03-08 | Discharge: 2020-03-08 | Disposition: A | Discharge status: Home / Self Care | Payer: PPO | Source: Ambulatory Visit | Attending: Adult Health | Admitting: Adult Health

## 2020-03-08 ENCOUNTER — Ambulatory Visit: Payer: PPO | Admitting: Adult Health

## 2020-03-08 ENCOUNTER — Ambulatory Visit
Admission: RE | Admit: 2020-03-08 | Discharge: 2020-03-08 | Disposition: A | Discharge status: Home / Self Care | Payer: PPO | Source: Ambulatory Visit | Attending: Infectious Diseases | Admitting: Infectious Diseases

## 2020-03-08 ENCOUNTER — Other Ambulatory Visit: Payer: Self-pay

## 2020-03-08 ENCOUNTER — Encounter: Payer: Self-pay | Admitting: Adult Health

## 2020-03-08 ENCOUNTER — Other Ambulatory Visit: Payer: Self-pay | Admitting: Internal Medicine

## 2020-03-08 DIAGNOSIS — J418 Mixed simple and mucopurulent chronic bronchitis: Secondary | ICD-10-CM | POA: Diagnosis not present

## 2020-03-08 DIAGNOSIS — R05 Cough: Secondary | ICD-10-CM | POA: Diagnosis not present

## 2020-03-08 DIAGNOSIS — R918 Other nonspecific abnormal finding of lung field: Secondary | ICD-10-CM | POA: Insufficient documentation

## 2020-03-08 DIAGNOSIS — A310 Pulmonary mycobacterial infection: Secondary | ICD-10-CM | POA: Insufficient documentation

## 2020-03-08 DIAGNOSIS — R0602 Shortness of breath: Secondary | ICD-10-CM | POA: Diagnosis not present

## 2020-03-08 LAB — CBC
HCT: 29.6 % — ABNORMAL LOW (ref 36.0–46.0)
Hemoglobin: 9.8 g/dL — ABNORMAL LOW (ref 12.0–15.0)
MCH: 28.2 pg (ref 26.0–34.0)
MCHC: 33.1 g/dL (ref 30.0–36.0)
MCV: 85.3 fL (ref 80.0–100.0)
Platelets: 608 10*3/uL — ABNORMAL HIGH (ref 150–400)
RBC: 3.47 MIL/uL — ABNORMAL LOW (ref 3.87–5.11)
RDW: 15.7 % — ABNORMAL HIGH (ref 11.5–15.5)
WBC: 9.7 10*3/uL (ref 4.0–10.5)
nRBC: 0 % (ref 0.0–0.2)

## 2020-03-08 LAB — COMPREHENSIVE METABOLIC PANEL
ALT: 10 U/L (ref 0–44)
AST: 14 U/L — ABNORMAL LOW (ref 15–41)
Albumin: 2.4 g/dL — ABNORMAL LOW (ref 3.5–5.0)
Alkaline Phosphatase: 158 U/L — ABNORMAL HIGH (ref 38–126)
Anion gap: 10 (ref 5–15)
BUN: 9 mg/dL (ref 8–23)
CO2: 25 mmol/L (ref 22–32)
Calcium: 8.5 mg/dL — ABNORMAL LOW (ref 8.9–10.3)
Chloride: 94 mmol/L — ABNORMAL LOW (ref 98–111)
Creatinine, Ser: 0.52 mg/dL (ref 0.44–1.00)
GFR calc Af Amer: >60 mL/min (ref >60)
GFR calc non Af Amer: >60 mL/min (ref >60)
Glucose, Bld: 113 mg/dL — ABNORMAL HIGH (ref 70–99)
Potassium: 3.8 mmol/L (ref 3.5–5.1)
Sodium: 129 mmol/L — ABNORMAL LOW (ref 135–145)
Total Bilirubin: 0.4 mg/dL (ref 0.3–1.2)
Total Protein: 7.2 g/dL (ref 6.5–8.1)

## 2020-03-08 MED ORDER — DEXTROSE 5 % IV SOLN
1250.0000 mg | Freq: Once | INTRAVENOUS | Status: AC
  Administered 2020-03-08: 1250 mg via INTRAVENOUS
  Filled 2020-03-08: qty 5

## 2020-03-08 MED ORDER — HEPARIN SOD (PORK) LOCK FLUSH 100 UNIT/ML IV SOLN
INTRAVENOUS | Status: AC
  Filled 2020-03-08: qty 5

## 2020-03-08 MED ORDER — ONDANSETRON HCL 4 MG PO TABS: 4.0000 mg | ORAL_TABLET | Freq: Three times a day (TID) | ORAL | 3 refills | Status: DC | PRN

## 2020-03-08 NOTE — Patient Instructions (Addendum)
Chest xray today .  Set up overnight oximetry test  Continue on Advair and Spiriva .  High protein/calorie shakes/supplements between meals.  Activity as tolerated. , call back if you change on physical therapy  Zofran 4mg  every 8hrs as needed for nausea.  Get second Covid vaccine.  Continue on Duoneb As needed   Use Flutter valve Twice daily   Follow up with Dr. Mortimer Fries in 4 weeks and As needed   Please contact office for sooner follow up if symptoms do not improve or worsen or seek emergency care

## 2020-03-08 NOTE — Progress Notes (Signed)
_0  ID: Georgianne Fick, female    DOB: 04-20-1956, 64 y.o.   MRN: [id number]  Chief Complaint  Patient presents with  . Acute Visit    Referring provider: Maryland Pink, MD  HPI: 63 yo female former smoker followed for COPD , Hx of Lung Cancer , and MAC   TEST/EVENTS :  CT chest 12/2019 - s/p RLL lobectomy , progressive thick walled cavitary lesion in Right posterior pleural space, numerous pulmonary nodules-mildly progressive   On 10/10/2018 she underwent bronchoscopy and bronchoalveolar lavage was sent for AFB and fungal and bacterial cultures.  The pathology showed BENIGN ALVEOLATED LUNG PARENCHYMA AND BRONCHIAL WALL WITH FOCAL  ORGANIZING FIBRIN. - NO EVIDENCE OF TUMOR OR GRANULOMA.    AFB and GMS special stains were negative. The AFB nucleic acid amplification testing was done and that was positive for Mycobacterium avium intracellulare and she was referred to me in February 2020.Marland Kitchen Started Anti MAC treatment feb 2020. Repeat Sputum in Sept had MAC and it was still susceptible to macrolide In Nov 2020 she has stopped meds because of pill fatigue and restarted Dec 2020  Sputum AFB 01/2020 + MAC   03/08/2020 Follow up: COPD, MAC  Patient presents today for follow up for COPD with progressively worsening symptoms of cough, congestion, dyspnea, weakness, decreased activity tolerance, and weight loss.  Last seen 10/2018  By Dr. Mortimer Fries. She is being followed by ID for MAC with progressive disease and persistent positive sputum AFB despite triple drug therapy . She was recently started on Amikacin. Since starting Amikacin has nausea, worsening weight loss. Weight down 16lbs, currently 83lbs.  She remains on Advair and Spiriva .  Has daily cough, congestion , thick mucus, dyspnea with minimal activity .  Denies hemoptysis , chest pain , fever, body aches.  Gets very winded with minimal activity . Walk test in the office with no desats .     Allergies  Allergen Reactions  . Keppra  [Levetiracetam] Other (See Comments)    Nausea and dizzy  . Lyrica [Pregabalin] Other (See Comments)    Made patient feel very dizzy and not feel good.     Immunization History  Administered Date(s) Administered  . Influenza Inj Mdck Quad Pf 06/03/2019  . Influenza,inj,Quad PF,6+ Mos 08/21/2015, 06/08/2018  . PFIZER SARS-COV-2 Vaccination 01/05/2020  . Pneumococcal Polysaccharide-23 06/08/2018    Past Medical History:  Diagnosis Date  . Cancer of lower lobe of right lung ([place]) 05/07/2015  . COPD (chronic obstructive pulmonary disease) ([place])   . Dyspnea   . Non-small cell lung cancer ([place])   . Pneumonia 04/2015  . Pulmonary Mycobacterium avium complex (MAC) infection ([place]) 10/25/2018    Tobacco History: Social History   Tobacco Use  Smoking Status Former Smoker  . Packs/day: 0.50  . Years: 40.00  . Pack years: 20.00  . Types: Cigarettes  . Quit date: 12/01/2019  . Years since quitting: 0.2  Smokeless Tobacco Never Used   Counseling given: Not Answered   Outpatient Medications Prior to Visit  Medication Sig Dispense Refill  . albuterol (PROVENTIL HFA;VENTOLIN HFA) 108 (90 Base) MCG/ACT inhaler Inhale 2 puffs into the lungs every 6 (six) hours as needed for wheezing or shortness of breath.     Marland Kitchen azithromycin (ZITHROMAX) 500 MG tablet Take 1 tablet (500 mg total) by mouth daily. 30 tablet 3  . benzonatate (TESSALON) 100 MG capsule TAKE 1 CAPSULE BY MOUTH THREE TIMES A DAY AS NEEDED FOR COUGH 90 capsule 3  .  chlorpheniramine-HYDROcodone (TUSSIONEX) 10-8 MG/5ML SUER Take 5 mLs by mouth every 12 (twelve) hours as needed for cough. 140 mL 0  . Cholecalciferol (VITAMIN D3) 1000 units CAPS Take 2,000 Units by mouth daily.     Marland Kitchen ethambutol (MYAMBUTOL) 400 MG tablet Take 1.5 tablets (600 mg total) by mouth daily. 45 tablet 3  . Fluticasone-Salmeterol (ADVAIR DISKUS) 250-50 MCG/DOSE AEPB Inhale 1 puff into the lungs every 12 (twelve) hours.     Marland Kitchen ipratropium-albuterol (DUONEB) 0.5-2.5  (3) MG/3ML SOLN Take 3 mLs by nebulization every 4 (four) hours as needed. 360 mL 3  . Respiratory Therapy Supplies (FLUTTER) DEVI 1 each by Does not apply route daily. 1 each 0  . rifabutin (MYCOBUTIN) 150 MG capsule Take 2 capsules (300 mg total) by mouth daily. 60 capsule 3  . SPIRIVA RESPIMAT 2.5 MCG/ACT AERS SMARTSIG:2 Inhalation Via Inhaler Daily    . traMADol (ULTRAM) 50 MG tablet Take 1 tablet (50 mg total) by mouth every 6 (six) hours as needed. 60 tablet 0  . vitamin B-12 (CYANOCOBALAMIN) 1000 MCG tablet Take 1,000 mcg by mouth daily.     Facility-Administered Medications Prior to Visit  Medication Dose Route Frequency Provider Last Rate Last Admin  . sodium chloride flush (NS) 0.9 % injection 10 mL  10 mL Intravenous PRN Cammie Sickle, MD         Review of Systems:   Constitutional:   No  weight loss, night sweats,  Fevers, chills,  +fatigue, or  lassitude.  HEENT:   No headaches,  Difficulty swallowing,  Tooth/dental problems, or  Sore throat,                No sneezing, itching, ear ache, nasal congestion, post nasal drip,   CV:  No chest pain,  Orthopnea, PND, swelling in lower extremities, anasarca, dizziness, palpitations, syncope.   GI  No heartburn, indigestion, abdominal pain, nausea, vomiting, diarrhea, change in bowel habits, loss of appetite, bloody stools.   Resp:    No chest wall deformity  Skin: no rash or lesions.  GU: no dysuria, change in color of urine, no urgency or frequency.  No flank pain, no hematuria   MS:  No joint pain or swelling.  No decreased range of motion.  No back pain.    Physical Exam  BP 110/70 (BP Location: Left Arm, Cuff Size: Normal)   Pulse 94   Temp (!) 97.3 F (36.3 C) (Temporal)   Ht _0  (1.549 m)   Wt 84 lb 6.4 oz (38.3 kg)   SpO2 99% Comment: on RA  BMI 15.95 kg/m   GEN: A/Ox3; pleasant , NAD, thin and cachexia    HEENT:  [place]/AT,    NOSE-clear, THROAT-clear, no lesions, no postnasal drip or exudate  noted.   NECK:  Supple w/ fair ROM; no JVD; normal carotid impulses w/o bruits; no thyromegaly or nodules palpated; no lymphadenopathy.    RESP  Scattered rhonchi ,  no accessory muscle use, no dullness to percussion  CARD:  RRR, no m/r/g, no peripheral edema, pulses intact, no cyanosis or clubbing.  GI:   Soft & nt; nml bowel sounds; no organomegaly or masses detected.   Musco: Warm bil, no deformities or joint swelling noted.   Neuro: alert, no focal deficits noted.    Skin: Warm, no lesions or rashes    Lab Results:  CBC    Component Value Date/Time   WBC 9.7 03/08/2020 0813   RBC 3.47 (L) 03/08/2020  0813   HGB 9.8 (L) 03/08/2020 0813   HCT 29.6 (L) 03/08/2020 0813   PLT 608 (H) 03/08/2020 0813   MCV 85.3 03/08/2020 0813   MCH 28.2 03/08/2020 0813   MCHC 33.1 03/08/2020 0813   RDW 15.7 (H) 03/08/2020 0813   LYMPHSABS 0.7 02/23/2020 0818   MONOABS 0.8 02/23/2020 0818   EOSABS 0.2 02/23/2020 0818   BASOSABS 0.1 02/23/2020 0818    BMET    Component Value Date/Time   NA 129 (L) 03/08/2020 0813   NA 137 07/30/2018 1245   K 3.8 03/08/2020 0813   CL 94 (L) 03/08/2020 0813   CO2 25 03/08/2020 0813   GLUCOSE 113 (H) 03/08/2020 0813   BUN 9 03/08/2020 0813   BUN 11 07/30/2018 1245   CREATININE 0.52 03/08/2020 0813   CALCIUM 8.5 (L) 03/08/2020 0813   GFRNONAA >60 03/08/2020 0813   GFRAA >60 03/08/2020 0813    BNP No results found for: BNP  ProBNP No results found for: PROBNP  Imaging: No results found.    No flowsheet data found.  No results found for: NITRICOXIDE      Assessment & Plan:   No problem-specific Assessment & Plan notes found for this encounter.     Rexene Edison, NP 03/08/2020

## 2020-03-09 LAB — AMIKACIN, TROUGH: Amikacin Tr: <0.8 ug/mL — ABNORMAL LOW (ref 1.0–8.0)

## 2020-03-09 LAB — AMIKACIN, PEAK: Amikacin Pk: 45.1 ug/mL — ABNORMAL HIGH (ref 20.0–30.0)

## 2020-03-09 NOTE — Assessment & Plan Note (Signed)
Increased pulmonary hygiene with flutter and IS  Cont on Advair and Spiriva  Plan  Patient Instructions  Chest xray today .  Set up overnight oximetry test  Continue on Advair and Spiriva .  High protein/calorie shakes/supplements between meals.  Activity as tolerated. , call back if you change on physical therapy .  Zofran 4mg  every 8hrs as needed for nausea.  Get second Covid vaccine.  Continue on Duoneb As needed   Use Flutter valve Twice daily   Follow up with Dr. Mortimer Fries in 4 weeks and As needed   Please contact office for sooner follow up if symptoms do not improve or worsen or seek emergency care

## 2020-03-09 NOTE — Assessment & Plan Note (Signed)
Progressive MAC despite triple therapy now on IV Amikacin . Patient has high symptom burden and increased medication side effects.  Complicated by malnutrition, weight loss, anorexia, physical deconditioning.  Needs increased pulmonary hygiene.  Check chest xray today  No desats with ambulation  Set up for ONO .   Plan  Patient Instructions  Chest xray today .  Set up overnight oximetry test  Continue on Advair and Spiriva .  High protein/calorie shakes/supplements between meals.  Activity as tolerated. , call back if you change on physical therapy .  Zofran 33m every 8hrs as needed for nausea.  Get second Covid vaccine.  Continue on Duoneb As needed   Use Flutter valve Twice daily   Follow up with Dr. KMortimer Friesin 4 weeks and As needed   Please contact office for sooner follow up if symptoms do not improve or worsen or seek emergency care

## 2020-03-10 ENCOUNTER — Inpatient Hospital Stay (HOSPITAL_BASED_OUTPATIENT_CLINIC_OR_DEPARTMENT_OTHER): Payer: PPO | Admitting: Internal Medicine

## 2020-03-10 ENCOUNTER — Other Ambulatory Visit: Payer: Self-pay | Admitting: Oncology

## 2020-03-10 ENCOUNTER — Telehealth: Payer: Self-pay

## 2020-03-10 ENCOUNTER — Other Ambulatory Visit: Payer: Self-pay | Admitting: Internal Medicine

## 2020-03-10 ENCOUNTER — Inpatient Hospital Stay: Payer: PPO | Attending: Internal Medicine

## 2020-03-10 ENCOUNTER — Encounter: Payer: Self-pay | Admitting: Internal Medicine

## 2020-03-10 ENCOUNTER — Ambulatory Visit
Admission: RE | Admit: 2020-03-10 | Discharge: 2020-03-10 | Disposition: A | Discharge status: Home / Self Care | Payer: PPO | Source: Ambulatory Visit | Attending: Infectious Diseases | Admitting: Infectious Diseases

## 2020-03-10 ENCOUNTER — Other Ambulatory Visit: Payer: Self-pay

## 2020-03-10 DIAGNOSIS — R110 Nausea: Secondary | ICD-10-CM | POA: Insufficient documentation

## 2020-03-10 DIAGNOSIS — J984 Other disorders of lung: Secondary | ICD-10-CM

## 2020-03-10 DIAGNOSIS — Z87891 Personal history of nicotine dependence: Secondary | ICD-10-CM | POA: Diagnosis not present

## 2020-03-10 DIAGNOSIS — R0602 Shortness of breath: Secondary | ICD-10-CM | POA: Diagnosis not present

## 2020-03-10 DIAGNOSIS — D649 Anemia, unspecified: Secondary | ICD-10-CM | POA: Insufficient documentation

## 2020-03-10 DIAGNOSIS — C3411 Malignant neoplasm of upper lobe, right bronchus or lung: Secondary | ICD-10-CM | POA: Insufficient documentation

## 2020-03-10 DIAGNOSIS — J449 Chronic obstructive pulmonary disease, unspecified: Secondary | ICD-10-CM | POA: Insufficient documentation

## 2020-03-10 DIAGNOSIS — A310 Pulmonary mycobacterial infection: Secondary | ICD-10-CM | POA: Diagnosis not present

## 2020-03-10 DIAGNOSIS — J851 Abscess of lung with pneumonia: Secondary | ICD-10-CM

## 2020-03-10 DIAGNOSIS — R05 Cough: Secondary | ICD-10-CM | POA: Insufficient documentation

## 2020-03-10 DIAGNOSIS — Z95828 Presence of other vascular implants and grafts: Secondary | ICD-10-CM

## 2020-03-10 DIAGNOSIS — R5383 Other fatigue: Secondary | ICD-10-CM | POA: Diagnosis not present

## 2020-03-10 DIAGNOSIS — C3492 Malignant neoplasm of unspecified part of left bronchus or lung: Secondary | ICD-10-CM | POA: Diagnosis not present

## 2020-03-10 DIAGNOSIS — R0781 Pleurodynia: Secondary | ICD-10-CM

## 2020-03-10 LAB — COMPREHENSIVE METABOLIC PANEL
ALT: 10 U/L (ref 0–44)
AST: 13 U/L — ABNORMAL LOW (ref 15–41)
Albumin: 2.3 g/dL — ABNORMAL LOW (ref 3.5–5.0)
Alkaline Phosphatase: 153 U/L — ABNORMAL HIGH (ref 38–126)
Anion gap: 10 (ref 5–15)
BUN: 12 mg/dL (ref 8–23)
CO2: 26 mmol/L (ref 22–32)
Calcium: 8 mg/dL — ABNORMAL LOW (ref 8.9–10.3)
Chloride: 96 mmol/L — ABNORMAL LOW (ref 98–111)
Creatinine, Ser: 0.44 mg/dL (ref 0.44–1.00)
GFR calc Af Amer: >60 mL/min (ref >60)
GFR calc non Af Amer: >60 mL/min (ref >60)
Glucose, Bld: 93 mg/dL (ref 70–99)
Potassium: 4.1 mmol/L (ref 3.5–5.1)
Sodium: 132 mmol/L — ABNORMAL LOW (ref 135–145)
Total Bilirubin: 0.3 mg/dL (ref 0.3–1.2)
Total Protein: 6.6 g/dL (ref 6.5–8.1)

## 2020-03-10 LAB — CBC WITH DIFFERENTIAL/PLATELET
Abs Immature Granulocytes: 0.04 10*3/uL (ref 0.00–0.07)
Basophils Absolute: 0.1 10*3/uL (ref 0.0–0.1)
Basophils Relative: 1 %
Eosinophils Absolute: 0.4 10*3/uL (ref 0.0–0.5)
Eosinophils Relative: 4 %
HCT: 26 % — ABNORMAL LOW (ref 36.0–46.0)
Hemoglobin: 8.5 g/dL — ABNORMAL LOW (ref 12.0–15.0)
Immature Granulocytes: 1 %
Lymphocytes Relative: 14 %
Lymphs Abs: 1.2 10*3/uL (ref 0.7–4.0)
MCH: 28 pg (ref 26.0–34.0)
MCHC: 32.7 g/dL (ref 30.0–36.0)
MCV: 85.5 fL (ref 80.0–100.0)
Monocytes Absolute: 0.8 10*3/uL (ref 0.1–1.0)
Monocytes Relative: 10 %
Neutro Abs: 5.9 10*3/uL (ref 1.7–7.7)
Neutrophils Relative %: 70 %
Platelets: 587 10*3/uL — ABNORMAL HIGH (ref 150–400)
RBC: 3.04 MIL/uL — ABNORMAL LOW (ref 3.87–5.11)
RDW: 15.6 % — ABNORMAL HIGH (ref 11.5–15.5)
WBC: 8.3 10*3/uL (ref 4.0–10.5)
nRBC: 0 % (ref 0.0–0.2)

## 2020-03-10 MED ORDER — HYDROCOD POLST-CPM POLST ER 10-8 MG/5ML PO SUER: 5.0000 mL | Freq: Two times a day (BID) | ORAL | 0 refills | Status: DC | PRN

## 2020-03-10 MED ORDER — MEGESTROL ACETATE 625 MG/5ML PO SUSP
625.0000 mg | Freq: Every day | ORAL | 0 refills | Status: DC
Start: 2020-03-10 — End: 2020-05-05

## 2020-03-10 MED ORDER — HEPARIN SOD (PORK) LOCK FLUSH 100 UNIT/ML IV SOLN
500.0000 [IU] | Freq: Once | INTRAVENOUS | Status: AC
  Administered 2020-03-10: 500 [IU] via INTRAVENOUS
  Filled 2020-03-10: qty 5

## 2020-03-10 MED ORDER — HEPARIN SOD (PORK) LOCK FLUSH 100 UNIT/ML IV SOLN
INTRAVENOUS | Status: AC
  Filled 2020-03-10: qty 5

## 2020-03-10 MED ORDER — DEXTROSE 5 % IV SOLN
1250.0000 mg | Freq: Once | INTRAVENOUS | Status: AC
  Administered 2020-03-10: 1250 mg via INTRAVENOUS
  Filled 2020-03-10: qty 5

## 2020-03-10 MED ORDER — SODIUM CHLORIDE 0.9% FLUSH
10.0000 mL | Freq: Once | INTRAVENOUS | Status: AC
  Administered 2020-03-10: 10 mL via INTRAVENOUS
  Filled 2020-03-10: qty 10

## 2020-03-10 NOTE — Progress Notes (Signed)
Pharmacy - Antimicrobial Stewardship  Contacted by [facility] Day Surgery at [facility] this morning regarding low peak levels.  Peak levels declining and appear to be true peaks.  Troughs remain undetectable as per goal.  SCr stable. Levels reviewed and discussed with Dr Delaine Lame.    Plan: - Increase Amikacin to 1500mg  IV q24h - Continue weekly levels - Continue monitoring renal function - Monitoring hearing  Doreene Eland, PharmD, BCPS.   Work Cell: [phone] 03/10/2020 4:23 PM

## 2020-03-10 NOTE — Assessment & Plan Note (Addendum)
#   Right upper lobe stage I lung cancer; s/p feb February 2019 SBRT-March 2021 CT scan [see below]-no obvious evidence of recurrence.  # Left lung stage III lung cancer-clinically STABLE; See below  # Bilateral MAC infection- [Dr. Levester Fresh, ID]; persistent MAC infection-s/p chemotherapy currently on second line therapy- Amikacin; unfortunately tolerating poorly. CT scan March 9th 2021-STABLE; cavitary lesions x2 right upper lobe; bilateral subcentimeter lung nodules- on amikacin/ ID appt tomorrow.  Recommend continued follow-up with pulmonary.  # Anemia-likely secondary to chronic inflammation-hemoglobin 10.5- Stable.  # COPD- worse; defer to PCP/pulmonary regarding O2 supplementation /managing COPD.   # weight loss-secondary to MAC infection; recommend Megace.  Dietary evaluation.  # DISPOSITION:  # dietary referral ASAP re: weight loss # Follow up in 3 months- MD- cbc/Cmp/port flush;Dr.B  Cc: Drs. Kasa/Ravikrishnan/ Crystal/ Hedrick.

## 2020-03-10 NOTE — Telephone Encounter (Signed)
Referral has been made to Dr. Genevive Bi for Dr. Zoila Shutter request.  ATC pt to make her aware of referral and received recording that my call could not be completed at this time.   From: [name], MD  Sent: 03/10/2020  9:29 AM EDT  To: Melvenia Needles, NP, Linwood Dibbles, CMA, *  Subject: RE:                        I had a discussion with Dr Ulice Dash, Lynelle Smoke I think at this point bar need referral to Dr Genevive Bi.   Hi Travoris Bushey!  Can you make referral for this Patient to see Dr Genevive Bi? Appointment ASAP   Thank you.

## 2020-03-10 NOTE — Telephone Encounter (Signed)
ATC EC on file- no answer with no option to leave voicemail.

## 2020-03-10 NOTE — Telephone Encounter (Signed)
Per Dr. Mortimer Fries verbally- order CT w/o. Once CT has been preformed, will determine if referral needs to be placed to Dr. Genevive Bi.   ATC pt to make her aware of this and I received a recording that call can not be completed at this time.

## 2020-03-10 NOTE — Progress Notes (Signed)
[place] OFFICE PROGRESS NOTE  Patient Care Team: [place] Pink, MD as PCP - General (Family Medicine) Cammie Sickle, MD as Medical Oncologist (Medical Oncology) Rockey Situ, Kathlene November, MD as Consulting Physician (Cardiology) Efrain Sella, MD as Consulting Physician (Gastroenterology) Noreene Filbert, MD as Referring Physician (Radiation Oncology)  Cancer Staging Malignant neoplasm of lung [facility]) Staging form: Lung, AJCC 7th Edition - Clinical: Stage IIIA (T3, N2, M0) - Signed by Forest Gleason, MD on 05/07/2015    Oncology History Overview Note  # Squamous cell carcinoma of right  LOWER LOBE OF  lung stage IIIA based on PET scan Biopsy of the (August of 2016). 2.  After 3 cycles of chemotherapy patient underwent resection of lung mass (November, 2016) ypT2B   ypN0 [Dr.Oaks]  status post right lower lobectomy with a 3. She  was started on carboplatinum and Taxol on a weekly basis and radiation therapy from January of 2017 4. After 2 cycles of carboplatinum patient had neuropathy grade 2 interfering with work so chemotherapy was put on hold and radiation was continued (January 19th, 2017) 5.  Chemotherapy was discontinued because of progressive neuropathy.  Patient is finishing of radiation therapy on November 19, 2015  # NOV 2017- CT- 60m right hilar LN; PET- Feb 2018- NED.   # DEC-JAN 2019- RUL Lung nodule [s/p Bronch; Dr.Kasa- non-diagnostic] SBRT Feb 2019  #October 10, 2018 bronchoscopy [Dr.Kasa]-negative for malignancy/positive for MAC infection-ethambutol [Dr.Ravishankar]; [facility]- R LE DVT [xarelto]x STOP in end of dec 2019. Syncope- MRI- 13 mm cystic leision/asymptomatic/[facility]; Bil subacute cerebellar stroke;  AUG 2019 - GIB/anemia [EGD/colo- Dr.Toledo; NEG]; Aug 2019-  Severe hypokalemia- sec to [place]-resolved.   DIAGNOSIS: RLL stage III lung ca; RUL stage I   GOALS: curative  CURRENT/MOST RECENT THERAPY :  Surveillaince    Malignant neoplasm of lung (HCC)  Malignant neoplasm of right upper lobe of lung (HCC)      INTERVAL HISTORY:  Claudia GALLANT89y.o.  female pleasant patient above history of stage III lung cancer; and also stage I right upper lobe status post radiation February 2019; history of MAC infection is here for follow-up.   Patient noted to have persistent MAC infection in the sputum.  So she is currently on second line antibiotics with amikacin IV.  Patient complains of extreme fatigue.  Complains of nausea.  Complains of continued shortness of breath and cough.  Complains of weight loss/poor taste.  Unfortunately feels very poorly.   Review of Systems  Constitutional: Positive for malaise/fatigue and weight loss. Negative for chills, diaphoresis and fever.  HENT: Negative for nosebleeds and sore throat.   Eyes: Negative for double vision.  Respiratory: Positive for cough, sputum production and shortness of breath. Negative for hemoptysis and wheezing.   Cardiovascular: Negative for chest pain, palpitations, orthopnea and leg swelling.  Gastrointestinal: Negative for abdominal pain, blood in stool, constipation, diarrhea, heartburn, melena, nausea and vomiting.  Genitourinary: Negative for dysuria, frequency and urgency.  Musculoskeletal: Positive for back pain and joint pain.  Skin: Negative.  Negative for itching and rash.  Neurological: Negative for tingling, focal weakness, weakness and headaches.  Endo/Heme/Allergies: Does not bruise/bleed easily.  Psychiatric/Behavioral: Negative for depression. The patient is not nervous/anxious and does not have insomnia.       PAST MEDICAL HISTORY :  Past Medical History:  Diagnosis Date  . Cancer of [place] lobe of right lung ([place] 05/07/2015  . COPD (chronic obstructive  pulmonary disease) ([place])   . Dyspnea   . Non-small cell lung cancer ([place])   . Pneumonia 04/2015  . Pulmonary Mycobacterium avium complex (MAC) infection  ([place]) 10/25/2018    PAST SURGICAL HISTORY :   Past Surgical History:  Procedure Laterality Date  . ABDOMINAL HYSTERECTOMY    . COLONOSCOPY WITH PROPOFOL N/A 06/09/2018   Procedure: COLONOSCOPY WITH PROPOFOL;  Surgeon: Toledo, Benay Pike, MD;  Location: [facility] ENDOSCOPY;  Service: Gastroenterology;  Laterality: N/A;  . ELBOW SURGERY Right 1995  . ELECTROMAGNETIC NAVIGATION BROCHOSCOPY N/A 10/25/2017   Procedure: ELECTROMAGNETIC NAVIGATION BRONCHOSCOPY;  Surgeon: [name], MD;  Location: [facility] ORS;  Service: Cardiopulmonary;  Laterality: N/A;  . ESOPHAGOGASTRODUODENOSCOPY N/A 06/08/2018   Procedure: ESOPHAGOGASTRODUODENOSCOPY (EGD);  Surgeon: Toledo, Benay Pike, MD;  Location: [facility] ENDOSCOPY;  Service: Gastroenterology;  Laterality: N/A;  . FLEXIBLE BRONCHOSCOPY Right 10/10/2018   Procedure: FLEXIBLE BRONCHOSCOPY;  Surgeon: [name], MD;  Location: [facility] ORS;  Service: Cardiopulmonary;  Laterality: Right;  . PORTACATH PLACEMENT Right 05/10/2015   Procedure: INSERTION PORT-A-CATH;  Surgeon: Nestor Lewandowsky, MD;  Location: [facility] ORS;  Service: General;  Laterality: Right;  Marland Kitchen VIDEO ASSISTED THORACOSCOPY (VATS)/THOROCOTOMY Right 08/18/2015   Procedure: VIDEO ASSISTED THORACOSCOPY (VATS)/THOROCOTOMY;  Surgeon: Nestor Lewandowsky, MD;  Location: [facility] ORS;  Service: General;  Laterality: Right;    FAMILY HISTORY :   Family History  Problem Relation Age of Onset  . Stroke Mother   . Lung cancer Father 69    SOCIAL HISTORY:   Social History   Tobacco Use  . Smoking status: Former Smoker    Packs/day: 0.50    Years: 40.00    Pack years: 20.00    Types: Cigarettes    Quit date: 12/01/2019    Years since quitting: 0.2  . Smokeless tobacco: Never Used  Vaping Use  . Vaping Use: Never used  Substance Use Topics  . Alcohol use: Not Currently    Alcohol/week: 2.0 - 4.0 standard drinks    Types: 2 - 4 Cans of beer per week    Comment: beer-occasionally  . Drug use: No    ALLERGIES:  is allergic to keppra  [levetiracetam] and lyrica [pregabalin].  MEDICATIONS:  Current Outpatient Medications  Medication Sig Dispense Refill  . albuterol (PROVENTIL HFA;VENTOLIN HFA) 108 (90 Base) MCG/ACT inhaler Inhale 2 puffs into the lungs every 6 (six) hours as needed for wheezing or shortness of breath.     Marland Kitchen azithromycin (ZITHROMAX) 500 MG tablet Take 1 tablet (500 mg total) by mouth daily. 30 tablet 3  . benzonatate (TESSALON) 100 MG capsule TAKE 1 CAPSULE BY MOUTH THREE TIMES A DAY AS NEEDED FOR COUGH 90 capsule 3  . chlorpheniramine-HYDROcodone (TUSSIONEX) 10-8 MG/5ML SUER Take 5 mLs by mouth every 12 (twelve) hours as needed for cough. 140 mL 0  . Cholecalciferol (VITAMIN D3) 1000 units CAPS Take 2,000 Units by mouth daily.     Marland Kitchen ethambutol (MYAMBUTOL) 400 MG tablet Take 1.5 tablets (600 mg total) by mouth daily. 45 tablet 3  . Fluticasone-Salmeterol (ADVAIR DISKUS) 250-50 MCG/DOSE AEPB Inhale 1 puff into the lungs every 12 (twelve) hours.     Marland Kitchen ipratropium-albuterol (DUONEB) 0.5-2.5 (3) MG/3ML SOLN Take 3 mLs by nebulization every 4 (four) hours as needed. 360 mL 3  . ondansetron (ZOFRAN) 4 MG tablet Take 1 tablet (4 mg total) by mouth every 8 (eight) hours as needed for nausea or vomiting. 20 tablet 3  . Respiratory Therapy Supplies (FLUTTER) DEVI 1 each by Does  not apply route daily. 1 each 0  . rifabutin (MYCOBUTIN) 150 MG capsule Take 2 capsules (300 mg total) by mouth daily. 60 capsule 3  . SPIRIVA RESPIMAT 2.5 MCG/ACT AERS SMARTSIG:2 Inhalation Via Inhaler Daily    . traMADol (ULTRAM) 50 MG tablet Take 1 tablet (50 mg total) by mouth every 6 (six) hours as needed. 60 tablet 0  . vitamin B-12 (CYANOCOBALAMIN) 1000 MCG tablet Take 1,000 mcg by mouth daily.    . megestrol (MEGACE ES) 625 MG/5ML suspension Take 5 mLs (625 mg total) by mouth daily. 150 mL 0   No current facility-administered medications for this visit.   Facility-Administered Medications Ordered in Other Visits  Medication Dose Route  Frequency Provider Last Rate Last Admin  . sodium chloride flush (NS) 0.9 % injection 10 mL  10 mL Intravenous PRN Cammie Sickle, MD        PHYSICAL EXAMINATION: ECOG PERFORMANCE STATUS: 1 - Symptomatic but completely ambulatory  BP 133/81   Pulse 90   Temp 97.7 F (36.5 C) (Tympanic)   Resp (!) 24   Ht _0  (1.549 m)   Wt 83 lb 3.2 oz (37.7 kg)   SpO2 99%   BMI 15.72 kg/m   Filed Weights   03/10/20 1023  Weight: 83 lb 3.2 oz (37.7 kg)    Physical Exam  Constitutional: She is oriented to person, place, and time.  Thin built Caucasian female patient.  She is alone.   HENT:  Head: Normocephalic and atraumatic.  Mouth/Throat: Oropharynx is clear and moist. No oropharyngeal exudate.  Eyes: Pupils are equal, round, and reactive to light.  Cardiovascular: Normal rate and regular rhythm.  Pulmonary/Chest: No respiratory distress. She has no wheezes.  Decreased air entry bilaterally.  Abdominal: Soft. Bowel sounds are normal. She exhibits no distension and no mass. There is no abdominal tenderness. There is no rebound and no guarding.  Musculoskeletal:        General: No tenderness or edema. Normal range of motion.     Cervical back: Normal range of motion and neck supple.  Neurological: She is alert and oriented to person, place, and time.  Skin: Skin is warm.  Psychiatric: Affect normal.   LABORATORY DATA:  I have reviewed the data as listed    Component Value Date/Time   NA 132 (L) 03/10/2020 1000   NA 137 07/30/2018 1245   K 4.1 03/10/2020 1000   CL 96 (L) 03/10/2020 1000   CO2 26 03/10/2020 1000   GLUCOSE 93 03/10/2020 1000   BUN 12 03/10/2020 1000   BUN 11 07/30/2018 1245   CREATININE 0.44 03/10/2020 1000   CALCIUM 8.0 (L) 03/10/2020 1000   PROT 6.6 03/10/2020 1000   ALBUMIN 2.3 (L) 03/10/2020 1000   AST 13 (L) 03/10/2020 1000   ALT 10 03/10/2020 1000   ALKPHOS 153 (H) 03/10/2020 1000   BILITOT 0.3 03/10/2020 1000   GFRNONAA >60 03/10/2020 1000    GFRAA >60 03/10/2020 1000    No results found for: SPEP, UPEP  Lab Results  Component Value Date   WBC 8.3 03/10/2020   NEUTROABS 5.9 03/10/2020   HGB 8.5 (L) 03/10/2020   HCT 26.0 (L) 03/10/2020   MCV 85.5 03/10/2020   PLT 587 (H) 03/10/2020      Chemistry      Component Value Date/Time   NA 132 (L) 03/10/2020 1000   NA 137 07/30/2018 1245   K 4.1 03/10/2020 1000   CL 96 (L) 03/10/2020  1000   CO2 26 03/10/2020 1000   BUN 12 03/10/2020 1000   BUN 11 07/30/2018 1245   CREATININE 0.44 03/10/2020 1000      Component Value Date/Time   CALCIUM 8.0 (L) 03/10/2020 1000   ALKPHOS 153 (H) 03/10/2020 1000   AST 13 (L) 03/10/2020 1000   ALT 10 03/10/2020 1000   BILITOT 0.3 03/10/2020 1000       RADIOGRAPHIC STUDIES: I have personally reviewed the radiological images as listed and agreed with the findings in the report. No results found.   ASSESSMENT & PLAN:  Malignant neoplasm of right upper lobe of lung [facility]) # Right upper lobe stage I lung cancer; s/p feb February 2019 SBRT-March 2021 CT scan [see below]-no obvious evidence of recurrence.  # Left lung stage III lung cancer-clinically STABLE; See below  # Bilateral MAC infection- [Dr. Levester Fresh, ID]; persistent MAC infection-s/p chemotherapy currently on second line therapy- Amikacin; unfortunately tolerating poorly. CT scan March 9th 2021-STABLE; cavitary lesions x2 right upper lobe; bilateral subcentimeter lung nodules- on amikacin/ ID appt tomorrow.  Recommend continued follow-up with pulmonary.  # Anemia-likely secondary to chronic inflammation-hemoglobin 10.5- Stable.  # COPD- worse; defer to PCP/pulmonary regarding O2 supplementation /managing COPD.   # weight loss-secondary to MAC infection; recommend Megace.  Dietary evaluation.  # DISPOSITION:  # dietary referral ASAP re: weight loss # Follow up in 3 months- MD- cbc/Cmp/port flush;Dr.B  Cc: Drs. Kasa/Ravikrishnan/ Crystal/ Hedrick.     Orders Placed  This Encounter  Procedures  . CBC with Differential    Standing Status:   Future    Standing Expiration Date:   03/10/2021  . Comprehensive metabolic panel    Standing Status:   Future    Standing Expiration Date:   03/10/2021   All questions were answered. The patient knows to call the clinic with any problems, questions or concerns.      Cammie Sickle, MD 03/11/2020 7:16 PM

## 2020-03-10 NOTE — Progress Notes (Signed)
Will Need CT chest to evaluate lungs

## 2020-03-11 NOTE — Telephone Encounter (Signed)
Pt is aware of need for CT. Nothing further is needed.

## 2020-03-12 ENCOUNTER — Ambulatory Visit
Admission: RE | Admit: 2020-03-12 | Discharge: 2020-03-12 | Disposition: A | Discharge status: Home / Self Care | Payer: PPO | Source: Ambulatory Visit | Attending: Infectious Diseases | Admitting: Infectious Diseases

## 2020-03-12 ENCOUNTER — Other Ambulatory Visit: Payer: Self-pay

## 2020-03-12 DIAGNOSIS — A310 Pulmonary mycobacterial infection: Secondary | ICD-10-CM | POA: Insufficient documentation

## 2020-03-12 LAB — BASIC METABOLIC PANEL
Anion gap: 9 (ref 5–15)
BUN: 14 mg/dL (ref 8–23)
CO2: 26 mmol/L (ref 22–32)
Calcium: 8.5 mg/dL — ABNORMAL LOW (ref 8.9–10.3)
Chloride: 93 mmol/L — ABNORMAL LOW (ref 98–111)
Creatinine, Ser: 0.52 mg/dL (ref 0.44–1.00)
GFR calc Af Amer: >60 mL/min (ref >60)
GFR calc non Af Amer: >60 mL/min (ref >60)
Glucose, Bld: 97 mg/dL (ref 70–99)
Potassium: 4 mmol/L (ref 3.5–5.1)
Sodium: 128 mmol/L — ABNORMAL LOW (ref 135–145)

## 2020-03-12 MED ORDER — HEPARIN SOD (PORK) LOCK FLUSH 100 UNIT/ML IV SOLN
INTRAVENOUS | Status: AC
  Administered 2020-03-12: 500 [IU]
  Filled 2020-03-12: qty 5

## 2020-03-12 MED ORDER — DEXTROSE 5 % IV SOLN
1500.0000 mg | INTRAVENOUS | Status: DC
  Administered 2020-03-12: 1500 mg via INTRAVENOUS
  Filled 2020-03-12: qty 6

## 2020-03-12 MED ORDER — HEPARIN SOD (PORK) LOCK FLUSH 100 UNIT/ML IV SOLN: 500.0000 [IU] | INTRAVENOUS | Status: AC | PRN

## 2020-03-13 LAB — AMIKACIN, TROUGH: Amikacin Tr: <0.8 ug/mL — ABNORMAL LOW (ref 1.0–8.0)

## 2020-03-15 ENCOUNTER — Encounter: Payer: Self-pay | Admitting: *Deleted

## 2020-03-15 ENCOUNTER — Ambulatory Visit
Admission: RE | Admit: 2020-03-15 | Discharge: 2020-03-15 | Disposition: A | Discharge status: Home / Self Care | Payer: PPO | Source: Ambulatory Visit | Attending: Family Medicine | Admitting: Family Medicine

## 2020-03-15 ENCOUNTER — Ambulatory Visit: Payer: PPO

## 2020-03-15 ENCOUNTER — Other Ambulatory Visit: Payer: Self-pay

## 2020-03-15 DIAGNOSIS — Z95828 Presence of other vascular implants and grafts: Secondary | ICD-10-CM | POA: Insufficient documentation

## 2020-03-15 LAB — CBC WITH DIFFERENTIAL/PLATELET
Abs Immature Granulocytes: 0.05 10*3/uL (ref 0.00–0.07)
Basophils Absolute: 0.1 10*3/uL (ref 0.0–0.1)
Basophils Relative: 1 %
Eosinophils Absolute: 0.1 10*3/uL (ref 0.0–0.5)
Eosinophils Relative: 1 %
HCT: 28.8 % — ABNORMAL LOW (ref 36.0–46.0)
Hemoglobin: 9.3 g/dL — ABNORMAL LOW (ref 12.0–15.0)
Immature Granulocytes: 1 %
Lymphocytes Relative: 11 %
Lymphs Abs: 1.1 10*3/uL (ref 0.7–4.0)
MCH: 27.6 pg (ref 26.0–34.0)
MCHC: 32.3 g/dL (ref 30.0–36.0)
MCV: 85.5 fL (ref 80.0–100.0)
Monocytes Absolute: 0.7 10*3/uL (ref 0.1–1.0)
Monocytes Relative: 7 %
Neutro Abs: 8.7 10*3/uL — ABNORMAL HIGH (ref 1.7–7.7)
Neutrophils Relative %: 79 %
Platelets: 658 10*3/uL — ABNORMAL HIGH (ref 150–400)
RBC: 3.37 MIL/uL — ABNORMAL LOW (ref 3.87–5.11)
RDW: 15.9 % — ABNORMAL HIGH (ref 11.5–15.5)
WBC: 10.7 10*3/uL — ABNORMAL HIGH (ref 4.0–10.5)
nRBC: 0 % (ref 0.0–0.2)

## 2020-03-15 LAB — BASIC METABOLIC PANEL
Anion gap: 12 (ref 5–15)
BUN: 12 mg/dL (ref 8–23)
CO2: 23 mmol/L (ref 22–32)
Calcium: 8.7 mg/dL — ABNORMAL LOW (ref 8.9–10.3)
Chloride: 90 mmol/L — ABNORMAL LOW (ref 98–111)
Creatinine, Ser: 0.63 mg/dL (ref 0.44–1.00)
GFR calc Af Amer: >60 mL/min (ref >60)
GFR calc non Af Amer: >60 mL/min (ref >60)
Glucose, Bld: 113 mg/dL — ABNORMAL HIGH (ref 70–99)
Potassium: 5 mmol/L (ref 3.5–5.1)
Sodium: 125 mmol/L — ABNORMAL LOW (ref 135–145)

## 2020-03-15 MED ORDER — HEPARIN SOD (PORK) LOCK FLUSH 100 UNIT/ML IV SOLN: 500.0000 [IU] | INTRAVENOUS | Status: DC | PRN

## 2020-03-15 MED ORDER — HEPARIN SOD (PORK) LOCK FLUSH 100 UNIT/ML IV SOLN
INTRAVENOUS | Status: AC
  Administered 2020-03-15: 500 [IU]
  Filled 2020-03-15: qty 5

## 2020-03-15 MED ORDER — DEXTROSE 5 % IV SOLN
1500.0000 mg | Freq: Once | INTRAVENOUS | Status: AC
  Administered 2020-03-15: 1500 mg via INTRAVENOUS
  Filled 2020-03-15: qty 6

## 2020-03-16 LAB — AMIKACIN, PEAK: Amikacin Pk: 77.8 ug/mL — ABNORMAL HIGH (ref 20.0–30.0)

## 2020-03-16 LAB — AMIKACIN, TROUGH: Amikacin Tr: <0.8 ug/mL — ABNORMAL LOW (ref 1.0–8.0)

## 2020-03-17 ENCOUNTER — Ambulatory Visit
Admission: RE | Admit: 2020-03-17 | Discharge: 2020-03-17 | Disposition: A | Discharge status: Home / Self Care | Payer: PPO | Source: Ambulatory Visit | Attending: Infectious Diseases | Admitting: Infectious Diseases

## 2020-03-17 ENCOUNTER — Other Ambulatory Visit: Payer: Self-pay

## 2020-03-17 DIAGNOSIS — Z452 Encounter for adjustment and management of vascular access device: Secondary | ICD-10-CM | POA: Diagnosis not present

## 2020-03-17 DIAGNOSIS — A312 Disseminated mycobacterium avium-intracellulare complex (DMAC): Secondary | ICD-10-CM | POA: Diagnosis present

## 2020-03-17 MED ORDER — HEPARIN SOD (PORK) LOCK FLUSH 100 UNIT/ML IV SOLN
INTRAVENOUS | Status: AC
  Administered 2020-03-17: 500 [IU] via INTRAVENOUS
  Filled 2020-03-17: qty 5

## 2020-03-17 MED ORDER — DEXTROSE 5 % IV SOLN
750.0000 mg | INTRAVENOUS | Status: DC
  Administered 2020-03-17: 750 mg via INTRAVENOUS
  Filled 2020-03-17: qty 3

## 2020-03-17 MED ORDER — HEPARIN SOD (PORK) LOCK FLUSH 100 UNIT/ML IV SOLN: 500.0000 [IU] | Freq: Once | INTRAVENOUS | Status: AC

## 2020-03-17 NOTE — Progress Notes (Signed)
Pharmacy - Antimicrobial Stewardship  64 YOF with MAC on amikacin TIW.  Her peaks were noted to be decreasing so dose increased to 1562m on 6/11  Peaks 5/17 50.8 on 10029mIV MWF 5/24 61.8 dose increased to 125063mV MWF 5/30 53.2 on 1250m41mF 6/7 45.1 on 1250mg28m - increase dose for decreasing peak value 6/14 77.8 on 1500mg 27mWF (per guidelines goal peak for non-tuberculosis mycobacterium 65-80 mcg/ml on thrice weekly dose)  All troughs undetectable.  SCr remains stable and consistent with previous values  Patient also noted to have worsening weight loss  Plan: - Based on previous peak and a 250mg d73mincrease, expected a peak of ~65, due to larger than anticipated rise in the amikacin peak 6/14 on 1500mg MW49mlan to reduce Amikacin to 750mg IV 57my.  Dr. RavishankDelaine Lame see patient in office 6/17 - Continue weekly levels - Continue monitoring renal function - Monitoring hearing  Claudia Chavez BCPS.   Work Cell: [phone] 9:11 AM

## 2020-03-18 ENCOUNTER — Other Ambulatory Visit
Admission: RE | Admit: 2020-03-18 | Discharge: 2020-03-18 | Disposition: A | Discharge status: Home / Self Care | Payer: PPO | Source: Ambulatory Visit | Attending: Infectious Diseases | Admitting: Infectious Diseases

## 2020-03-18 ENCOUNTER — Encounter: Payer: Self-pay | Admitting: Infectious Diseases

## 2020-03-18 ENCOUNTER — Ambulatory Visit
Admission: RE | Admit: 2020-03-18 | Discharge: 2020-03-18 | Disposition: A | Discharge status: Home / Self Care | Payer: PPO | Source: Ambulatory Visit | Attending: Internal Medicine | Admitting: Internal Medicine

## 2020-03-18 ENCOUNTER — Other Ambulatory Visit: Payer: Self-pay

## 2020-03-18 ENCOUNTER — Ambulatory Visit: Payer: PPO | Attending: Infectious Diseases | Admitting: Infectious Diseases

## 2020-03-18 VITALS — BP 106/70 | HR 99 | Temp 97.9°F | Resp 16 | Ht 61.0 in | Wt 82.8 lb

## 2020-03-18 DIAGNOSIS — Z87891 Personal history of nicotine dependence: Secondary | ICD-10-CM | POA: Diagnosis not present

## 2020-03-18 DIAGNOSIS — J851 Abscess of lung with pneumonia: Secondary | ICD-10-CM | POA: Insufficient documentation

## 2020-03-18 DIAGNOSIS — J449 Chronic obstructive pulmonary disease, unspecified: Secondary | ICD-10-CM | POA: Diagnosis not present

## 2020-03-18 DIAGNOSIS — J181 Lobar pneumonia, unspecified organism: Secondary | ICD-10-CM | POA: Diagnosis not present

## 2020-03-18 DIAGNOSIS — A310 Pulmonary mycobacterial infection: Secondary | ICD-10-CM

## 2020-03-18 DIAGNOSIS — R110 Nausea: Secondary | ICD-10-CM | POA: Insufficient documentation

## 2020-03-18 DIAGNOSIS — R630 Anorexia: Secondary | ICD-10-CM | POA: Diagnosis not present

## 2020-03-18 DIAGNOSIS — R634 Abnormal weight loss: Secondary | ICD-10-CM | POA: Diagnosis not present

## 2020-03-18 DIAGNOSIS — Z792 Long term (current) use of antibiotics: Secondary | ICD-10-CM | POA: Diagnosis not present

## 2020-03-18 DIAGNOSIS — Z85118 Personal history of other malignant neoplasm of bronchus and lung: Secondary | ICD-10-CM | POA: Insufficient documentation

## 2020-03-18 DIAGNOSIS — E871 Hypo-osmolality and hyponatremia: Secondary | ICD-10-CM | POA: Insufficient documentation

## 2020-03-18 DIAGNOSIS — D649 Anemia, unspecified: Secondary | ICD-10-CM | POA: Diagnosis not present

## 2020-03-18 DIAGNOSIS — Z681 Body mass index (BMI) 19 or less, adult: Secondary | ICD-10-CM | POA: Diagnosis not present

## 2020-03-18 LAB — CBC WITH DIFFERENTIAL/PLATELET
Abs Immature Granulocytes: 0.05 10*3/uL (ref 0.00–0.07)
Basophils Absolute: 0.1 10*3/uL (ref 0.0–0.1)
Basophils Relative: 1 %
Eosinophils Absolute: 0.1 10*3/uL (ref 0.0–0.5)
Eosinophils Relative: 1 %
HCT: 27.4 % — ABNORMAL LOW (ref 36.0–46.0)
Hemoglobin: 8.9 g/dL — ABNORMAL LOW (ref 12.0–15.0)
Immature Granulocytes: 0 %
Lymphocytes Relative: 8 %
Lymphs Abs: 0.9 10*3/uL (ref 0.7–4.0)
MCH: 27.8 pg (ref 26.0–34.0)
MCHC: 32.5 g/dL (ref 30.0–36.0)
MCV: 85.6 fL (ref 80.0–100.0)
Monocytes Absolute: 0.7 10*3/uL (ref 0.1–1.0)
Monocytes Relative: 6 %
Neutro Abs: 9.3 10*3/uL — ABNORMAL HIGH (ref 1.7–7.7)
Neutrophils Relative %: 84 %
Platelets: 551 10*3/uL — ABNORMAL HIGH (ref 150–400)
RBC: 3.2 MIL/uL — ABNORMAL LOW (ref 3.87–5.11)
RDW: 16 % — ABNORMAL HIGH (ref 11.5–15.5)
WBC: 11.1 10*3/uL — ABNORMAL HIGH (ref 4.0–10.5)
nRBC: 0 % (ref 0.0–0.2)

## 2020-03-18 LAB — COMPREHENSIVE METABOLIC PANEL
ALT: 9 U/L (ref 0–44)
AST: 13 U/L — ABNORMAL LOW (ref 15–41)
Albumin: 2.6 g/dL — ABNORMAL LOW (ref 3.5–5.0)
Alkaline Phosphatase: 145 U/L — ABNORMAL HIGH (ref 38–126)
Anion gap: 10 (ref 5–15)
BUN: 11 mg/dL (ref 8–23)
CO2: 26 mmol/L (ref 22–32)
Calcium: 8.6 mg/dL — ABNORMAL LOW (ref 8.9–10.3)
Chloride: 91 mmol/L — ABNORMAL LOW (ref 98–111)
Creatinine, Ser: 0.45 mg/dL (ref 0.44–1.00)
GFR calc Af Amer: >60 mL/min (ref >60)
GFR calc non Af Amer: >60 mL/min (ref >60)
Glucose, Bld: 103 mg/dL — ABNORMAL HIGH (ref 70–99)
Potassium: 3.7 mmol/L (ref 3.5–5.1)
Sodium: 127 mmol/L — ABNORMAL LOW (ref 135–145)
Total Bilirubin: 0.5 mg/dL (ref 0.3–1.2)
Total Protein: 7.4 g/dL (ref 6.5–8.1)

## 2020-03-18 LAB — CORTISOL: Cortisol, Plasma: 13.6 ug/dL

## 2020-03-18 LAB — SEDIMENTATION RATE: Sed Rate: >140 mm/hr — ABNORMAL HIGH (ref 0–30)

## 2020-03-18 MED ORDER — ONDANSETRON 4 MG PO TBDP: 4.0000 mg | ORAL_TABLET | Freq: Three times a day (TID) | ORAL | 0 refills | Status: DC | PRN

## 2020-03-18 NOTE — Progress Notes (Signed)
NAME: Claudia Chavez  DOB: 10-20-1955  MRN: [id number]  Date/Time: 03/18/2020 12:25 PM  Dr.Kasa Maretta Bees Subjective:  follow up visit for pulmonary MAC infection Started treatment 11/11/18 Last visit on 02/05/2020 U this is an urgent visit as patient has been losing weight.  On 01/01/2020 she was 96 pounds and then on 02/05/2020 she was 90 pounds and today she is 82 pounds. Patient with history of MAC pulmonary infection who has been on treatment with rifabutin, ethambutol and azithromycin since 11/11/2018.  Because of progression of the cavitary lesion amikacin infusion was started on 02/06/2020.  she was getting intermittent infusion on Monday Wednesday Friday at 1000 mg dosing.  On 02/20/2020 it was increased to 1250 mg IV to achieve a higher peak of more than 60 as the current peak was 50.8 and the trough was less than 0.8. Patient continued to have persisting cough and was also losing weight with poor appetite and had gone to see pulmonologist and a chest x-ray was done on 03/08/2020..  The chest x-ray showed thick-walled cavitary lesion in the right lung apex with a layering air-fluid level.  Dr. Mortimer Fries recommended a CT chest.  The CT was done today.  Patient is seeing me today because of persistent weight loss since 01/01/2020. She has nausea She does not have any fever She has persistent cough Her appetite is poor She does not have any abdominal pain or diarrhea or dizziness or vertigo or hearing deficit. She has poor energy    Medical history She is a 64 year old female who has a  history of Lung cancer , COPD Patient was diagnosed non-small cell right lung cancer in July 2016 and underwent 2 cycles of chemotherapy with Taxol and carboplatin  .   She developed  neuropathy.  And chemo was stopped.  She had right lower lobe resection  in November 2016 at [facility] by Dr. Faith Rogue.  This was followed by radiation therapy to the right lung in February 2017 for 30 days.  In May 2017 a repeat CAT scan was noted as  interval right lower lobe resection without evidence of local recurrence or definite metastatic disease. IShe was followed by oncology with serial CAT scans and PET scans. PET scan Feb 9th 2018- no evidence of recurrence; postoperative changes/stable right hilar lymph node without any activity noted. On 05/25/17 CT showed partial cavitary right upper lobe pulmonary nodule, most consistent with metastatic disease versus metachronous primary.  She was seen by pulmonologist Dr.Casa and underwent bronch and biopsy on 10/25/17.  Pathology report was VERY SCANT LUNG TISSUE WITH PIGMENTED MACROPHAGES. - NO NEOPLASM IS IDENTIFIED IN THIS SPECIMEN She received  SBRT  to the right lung X 5 doses-   Subsequently she was complaining of cough and productive sputum and she was treated with multiple courses of antibiotics including Levaquin.  A repeat CT scan was done in  Jan 2020 and it showed Thick-walled, partially cavitary process in the right lung apex measuring 5.4 x 7.4 cm (series 3/image 34), progressive, previously 4.2 x 2.9 cm.  Additional thick-walled cavitary process posteriorly in the right upper lobe measuring 8.1 x 5.5 cm (series 3/image 61), previously 7.7 x 5.1 cm when measured in a similar fashion.      On 10/10/2018 she underwent bronchoscopy and bronchoalveolar lavage was sent for AFB and fungal and bacterial cultures.  The pathology showed BENIGN ALVEOLATED LUNG PARENCHYMA AND BRONCHIAL WALL WITH FOCAL  ORGANIZING FIBRIN. - NO EVIDENCE OF TUMOR OR GRANULOMA.  AFB and GMS special stains were negative. The AFB nucleic acid amplification testing was done and that was positive for Mycobacterium avium intracellulare and she was referred to me in 11-28-18.Marland Kitchen Started Anti MAC treatment 11-28-2018. Repeat Sputum in Sept had MAC and it was still susceptible to macrolide In Nov 2020 she has stopped meds because of pill fatigue and restarted Dec 2020 CT scan done in March 2021     She  was a smoker until December 2019.  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 Husband died in November 28, 2016 of lung cancer as well.  Past Medical History:  Diagnosis Date  . Cancer of lower lobe of right lung ([place]) 05/07/2015  . COPD (chronic obstructive pulmonary disease) ([place])   . Dyspnea   . Non-small cell lung cancer ([place])   . Pneumonia 04/2015  . Pulmonary Mycobacterium avium complex (MAC) infection ([place]) 10/25/2018    Past Surgical History:  Procedure Laterality Date  . ABDOMINAL HYSTERECTOMY    . COLONOSCOPY WITH PROPOFOL N/A 06/09/2018   Procedure: COLONOSCOPY WITH PROPOFOL;  Surgeon: Toledo, Benay Pike, MD;  Location: [facility] ENDOSCOPY;  Service: Gastroenterology;  Laterality: N/A;  . ELBOW SURGERY Right 1995  . ELECTROMAGNETIC NAVIGATION BROCHOSCOPY N/A 10/25/2017   Procedure: ELECTROMAGNETIC NAVIGATION BRONCHOSCOPY;  Surgeon: [name], MD;  Location: [facility] ORS;  Service: Cardiopulmonary;  Laterality: N/A;  . ESOPHAGOGASTRODUODENOSCOPY N/A 06/08/2018   Procedure: ESOPHAGOGASTRODUODENOSCOPY (EGD);  Surgeon: Toledo, Benay Pike, MD;  Location: [facility] ENDOSCOPY;  Service: Gastroenterology;  Laterality: N/A;  . FLEXIBLE BRONCHOSCOPY Right 10/10/2018   Procedure: FLEXIBLE BRONCHOSCOPY;  Surgeon: [name], MD;  Location: [facility] ORS;  Service: Cardiopulmonary;  Laterality: Right;  . PORTACATH PLACEMENT Right 05/10/2015   Procedure: INSERTION PORT-A-CATH;  Surgeon: Nestor Lewandowsky, MD;  Location: [facility] ORS;  Service: General;  Laterality: Right;  Marland Kitchen VIDEO ASSISTED THORACOSCOPY (VATS)/THOROCOTOMY Right 08/18/2015   Procedure: VIDEO ASSISTED THORACOSCOPY (VATS)/THOROCOTOMY;  Surgeon: Nestor Lewandowsky, MD;  Location: [facility] ORS;  Service: General;  Laterality: Right;    Social History   Socioeconomic History  . Marital status: Widowed    Spouse name: Not on file  . Number of children: Not on file  . Years of education: Not on file  . Highest education level: Not on file  Occupational History  . Not on  file  Tobacco Use  . Smoking status: Former Smoker    Packs/day: 0.50    Years: 40.00    Pack years: 20.00    Types: Cigarettes    Quit date: 12/01/2019    Years since quitting: 0.2  . Smokeless tobacco: Never Used  Vaping Use  . Vaping Use: Never used  Substance and Sexual Activity  . Alcohol use: Not Currently    Alcohol/week: 2.0 - 4.0 standard drinks    Types: 2 - 4 Cans of beer per week    Comment: beer-occasionally  . Drug use: No  . Sexual activity: Never  Other Topics Concern  . Not on file  Social History Narrative  . Not on file   Social Determinants of Health   Financial Resource Strain:   . Difficulty of Paying Living Expenses:   Food Insecurity:   . Worried About Charity fundraiser in the Last Year:   . Arboriculturist in the Last Year:   Transportation Needs:   . Film/video editor (Medical):   Marland Kitchen Lack of Transportation (Non-Medical):   Physical Activity:   . Days of Exercise per Week:   .  Minutes of Exercise per Session:   Stress:   . Feeling of Stress :   Social Connections:   . Frequency of Communication with Friends and Family:   . Frequency of Social Gatherings with Friends and Family:   . Attends Religious Services:   . Active Member of Clubs or Organizations:   . Attends Archivist Meetings:   Marland Kitchen Marital Status:   Intimate Partner Violence:   . Fear of Current or Ex-Partner:   . Emotionally Abused:   Marland Kitchen Physically Abused:   . Sexually Abused:     Family History  Problem Relation Age of Onset  . Stroke Mother   . Lung cancer Father 28   Allergies  Allergen Reactions  . Keppra [Levetiracetam] Other (See Comments)    Nausea and dizzy  . Lyrica [Pregabalin] Other (See Comments)    Made patient feel very dizzy and not feel good.    Current medications  ? Current Outpatient Medications  Medication Sig Dispense Refill  . albuterol (PROVENTIL HFA;VENTOLIN HFA) 108 (90 Base) MCG/ACT inhaler Inhale 2 puffs into the lungs every  6 (six) hours as needed for wheezing or shortness of breath.     Marland Kitchen azithromycin (ZITHROMAX) 500 MG tablet Take 1 tablet (500 mg total) by mouth daily. 30 tablet 3  . benzonatate (TESSALON) 100 MG capsule TAKE 1 CAPSULE BY MOUTH THREE TIMES A DAY AS NEEDED FOR COUGH 90 capsule 3  . Cholecalciferol (VITAMIN D3) 1000 units CAPS Take 2,000 Units by mouth daily.     Marland Kitchen ethambutol (MYAMBUTOL) 400 MG tablet Take 1.5 tablets (600 mg total) by mouth daily. 45 tablet 3  . Fluticasone-Salmeterol (ADVAIR DISKUS) 250-50 MCG/DOSE AEPB Inhale 1 puff into the lungs every 12 (twelve) hours.     Marland Kitchen ipratropium-albuterol (DUONEB) 0.5-2.5 (3) MG/3ML SOLN Take 3 mLs by nebulization every 4 (four) hours as needed. 360 mL 3  . megestrol (MEGACE ES) 625 MG/5ML suspension Take 5 mLs (625 mg total) by mouth daily. 150 mL 0  . ondansetron (ZOFRAN) 4 MG tablet Take 1 tablet (4 mg total) by mouth every 8 (eight) hours as needed for nausea or vomiting. 20 tablet 3  . Respiratory Therapy Supplies (FLUTTER) DEVI 1 each by Does not apply route daily. 1 each 0  . rifabutin (MYCOBUTIN) 150 MG capsule Take 2 capsules (300 mg total) by mouth daily. 60 capsule 3  . SPIRIVA RESPIMAT 2.5 MCG/ACT AERS SMARTSIG:2 Inhalation Via Inhaler Daily    . traMADol (ULTRAM) 50 MG tablet TAKE 1 TABLET (50 MG TOTAL) BY MOUTH EVERY 6 (SIX) HOURS AS NEEDED. 28 tablet 2  . vitamin B-12 (CYANOCOBALAMIN) 1000 MCG tablet Take 1,000 mcg by mouth daily.     No current facility-administered medications for this visit.   Facility-Administered Medications Ordered in Other Visits  Medication Dose Route Frequency Provider Last Rate Last Admin  . sodium chloride flush (NS) 0.9 % injection 10 mL  10 mL Intravenous PRN Cammie Sickle, MD         Abtx:  Anti-infectives (From admission, onward)   None      REVIEW OF SYSTEMS:  As above Rest of the system neg Objective:  VITALS:  BP 106/70   Pulse 99   Temp 97.9 F (36.6 C)   Resp 16   Ht _0   (1.549 m)   Wt 82 lb 12.8 oz (37.6 kg)   SpO2 97%   BMI 15.64 kg/m    PHYSICAL EXAM:  General: Alert,  cooperative, emaciated Head: Normocephalic, without obvious abnormality, atraumatic. Eyes: Conjunctivae clear, anicteric sclerae. Pupils are equal ENT did not examine Neck: Supple, symmetrical, no adenopathy, thyroid: non tender no carotid bruit and no JVD. Back: No CVA tenderness. Lungs: Bilateral air entry, decreased on the right side Heart: S1-S2 Abdomen: Soft, non-tender,not distended. Bowel sounds normal. No masses Extremities: atraumatic, no cyanosis. No edema. No clubbing Skin: No rashes or lesions. Or bruising Lymph: Cervical, supraclavicular normal. Neurologic: Grossly non-focal Pertinent Labs Lab Results CBC    Component Value Date/Time   WBC 10.7 (H) 03/15/2020 0817   RBC 3.37 (L) 03/15/2020 0817   HGB 9.3 (L) 03/15/2020 0817   HCT 28.8 (L) 03/15/2020 0817   PLT 658 (H) 03/15/2020 0817   MCV 85.5 03/15/2020 0817   MCH 27.6 03/15/2020 0817   MCHC 32.3 03/15/2020 0817   RDW 15.9 (H) 03/15/2020 0817   LYMPHSABS 1.1 03/15/2020 0817   MONOABS 0.7 03/15/2020 0817   EOSABS 0.1 03/15/2020 0817   BASOSABS 0.1 03/15/2020 0817    CMP Latest Ref Rng & Units 03/15/2020 03/12/2020 03/10/2020  Glucose 70 - 99 mg/dL 113(H) 97 93  BUN 8 - 23 mg/dL _0 Creatinine 0.44 - 1.00 mg/dL 0.63 0.52 0.44  Sodium 135 - 145 mmol/L 125(L) 128(L) 132(L)  Potassium 3.5 - 5.1 mmol/L 5.0 4.0 4.1  Chloride 98 - 111 mmol/L 90(L) 93(L) 96(L)  CO2 22 - 32 mmol/L _1 Calcium 8.9 - 10.3 mg/dL 8.7(L) 8.5(L) 8.0(L)  Total Protein 6.5 - 8.1 g/dL - - 6.6  Total Bilirubin 0.3 - 1.2 mg/dL - - 0.3  Alkaline Phos 38 - 126 U/L - - 153(H)  AST 15 - 41 U/L - - 13(L)  ALT 0 - 44 U/L - - 10      Microbiology: 10/10/2018 acid-fast organism ID by nucleic acid amplification test is M avium complex.     Sept 2020     Impression/Recommendation 64 y.o.female  with a history of Lung  cancer , COPD   Fibrocavitary  lesions in the right lung.  Pulmonary MAC Severe infection Was worsening - on triple antibiotic therapy which started February 2020 and hence amikacin was added on 02/06/2020. Repeat sputum  from 01/01/20 is still positive for MAC and now the azithromycin is resistant.   She is currently on 4 antibiotics . On  azithromycin  500 mg once a day, ethambutol 55m/kg body weight (6078m and rifabutin  300 mg a day  In between there was some adherence issue but patient now is currently 100% adherent Because of progressing weight loss of now 14 pounds since April 2021 and poor appetite, nausea we will stop all antibiotics. Await CT chest results  Anemia.  She had been receiving blood transfusion until the past year when her hemoglobin has been stable now it is slowly decreasing again.  Right lung carcinoma.  Status post right lower lobectomy, radiation, recurrence, SBRT.  Followed by Dr. BrYevette Edwardsnd Dr. KaMortimer FriesBecause of continuing weight loss we need to rule out recurrence of lung cancer.  CT chest is done today.  Persistent hyponatremia could be due to SIADH from the lung lesions but will also check a cortisol level.  COPD on inhalers and nebulizers Smoker: Quit smoking December 2019  Discussed the management with the patient in detail.  We will see her in 2 weeks.

## 2020-03-18 NOTE — Patient Instructions (Signed)
You are here for follow up of MAC_ you have lost 14 pounds since you started Amikacin . Recommend stopping all the MAC antibiotics including zithromax, rifabutin and ethambutol and amikacin Will send zofran for nausea Will do labs today Follow up 2 weeks

## 2020-03-19 ENCOUNTER — Ambulatory Visit (INDEPENDENT_AMBULATORY_CARE_PROVIDER_SITE_OTHER): Payer: PPO | Admitting: Cardiothoracic Surgery

## 2020-03-19 ENCOUNTER — Other Ambulatory Visit: Payer: Self-pay

## 2020-03-19 ENCOUNTER — Ambulatory Visit: Admission: RE | Admit: 2020-03-19 | Payer: PPO | Source: Ambulatory Visit

## 2020-03-19 ENCOUNTER — Encounter: Payer: Self-pay | Admitting: Cardiothoracic Surgery

## 2020-03-19 VITALS — BP 152/88 | HR 95 | Temp 97.2°F | Resp 12 | Ht 61.0 in | Wt 82.8 lb

## 2020-03-19 DIAGNOSIS — R911 Solitary pulmonary nodule: Secondary | ICD-10-CM | POA: Diagnosis not present

## 2020-03-19 NOTE — Patient Instructions (Addendum)
Dr.Oaks will call you next week to follow up. If you do not hear from Dr.Oaks by next Friday please call our office.

## 2020-03-19 NOTE — Progress Notes (Signed)
[facility] Follow Up Note  Patient ID: AUBRYANNA NESHEIM, female   DOB: August 10, 1956, 64 y.o.   MRN: [id number]  HISTORY: Ms. Hervey Ard returns today in follow-up.  She is a 64 year old woman who is now about 5 years out from a right lower lobectomy for bronchogenic carcinoma.  She was treated with preoperative chemotherapy and subsequently underwent postoperative chemoradiation therapy.  About 2 years ago she began developing necrotizing pneumonia in the right lung and this has progressed over time.  She is undergone bronchoscopy and has had a diagnosis of MAC infection.  She has been treated with multiple antibiotics over a prolonged period of time and recently all of her antibiotics have been stopped secondary to failure to treat the underlying problem.  She is continued to lose weight.  She is down from approximately 95 pounds to 82 pounds.  She did smoke and continue to smoke up until about a month ago.  She gets significantly short of breath with any activities.  Walking in to the office from the parking lot distance of about 150 feet makes her short of breath.  She is able to take a shower but then gets short of breath as well.  She has a nonproductive cough.  She has no fever.    Vitals:   03/19/20 0828  BP: (!) 152/88  Pulse: 95  Resp: 12  Temp: (!) 97.2 F (36.2 C)  SpO2: 93%     EXAM:  Resp: Lungs are very distant bilaterally.  No respiratory distress, normal effort. Heart:  Regular without murmurs Abd:  Abdomen is soft, non distended and non tender. No masses are palpable.  There is no rebound and no guarding.  Neurological: Alert and oriented to person, place, and time. Coordination normal.  Skin: Skin is warm and dry. No rash noted. No diaphoretic. No erythema. No pallor.  Psychiatric: Normal mood and affect. Normal behavior. Judgment and thought content normal.      ASSESSMENT: I have independently reviewed her chest CT scans.  There is progressive volume loss in the right lung.   There is a large abscess cavity with an air-fluid level.  There are multiple left-sided lung nodules as well.   PLAN:   She has had a very difficult time postoperatively.  She is now about 5 years out from her right lower lobectomy without any obvious signs of recurrence.  However her chronic infection in her lung is made it difficult for her to gain weight.  She has a very poor appetite.  I did discuss with her the options of drainage or open drainage such as an Eloesser flap.  Given her very poor nutritional state that would need to be addressed however.  I would like to also discuss her care with Dr. Lynett Fish and with my colleagues in [place] to see if any additional diagnostic or therapeutic options may be available.  I have asked her to come back to see me in 1 week.    Nestor Lewandowsky, MD

## 2020-03-22 ENCOUNTER — Inpatient Hospital Stay: Payer: PPO

## 2020-03-22 ENCOUNTER — Ambulatory Visit: Admission: RE | Admit: 2020-03-22 | Payer: PPO | Source: Ambulatory Visit

## 2020-03-22 NOTE — Progress Notes (Signed)
Nutrition  RD has tried to call patient several times this afternoon for scheduled phone visit and no answer.  Voicemail has not been set up so unable to leave message.  Will send message to provider and scheduler to offer another appointment.  Tzivia Oneil B. Zenia Resides, [place], [place] Registered Dietitian [phone] (pager)

## 2020-03-24 ENCOUNTER — Ambulatory Visit: Payer: PPO

## 2020-03-26 ENCOUNTER — Ambulatory Visit: Payer: PPO | Admitting: Cardiothoracic Surgery

## 2020-03-26 ENCOUNTER — Other Ambulatory Visit
Admission: RE | Admit: 2020-03-26 | Discharge: 2020-03-26 | Disposition: A | Discharge status: Home / Self Care | Payer: PPO | Source: Ambulatory Visit | Attending: Cardiothoracic Surgery | Admitting: Cardiothoracic Surgery

## 2020-03-26 ENCOUNTER — Other Ambulatory Visit: Payer: Self-pay | Admitting: Internal Medicine

## 2020-03-26 ENCOUNTER — Encounter: Payer: Self-pay | Admitting: Cardiothoracic Surgery

## 2020-03-26 ENCOUNTER — Telehealth: Payer: Self-pay

## 2020-03-26 ENCOUNTER — Other Ambulatory Visit: Payer: Self-pay

## 2020-03-26 ENCOUNTER — Ambulatory Visit: Payer: PPO

## 2020-03-26 ENCOUNTER — Other Ambulatory Visit: Payer: Self-pay | Admitting: Cardiothoracic Surgery

## 2020-03-26 VITALS — BP 122/82 | HR 105 | Temp 97.5°F | Resp 18 | Ht 61.0 in | Wt 81.2 lb

## 2020-03-26 DIAGNOSIS — J869 Pyothorax without fistula: Secondary | ICD-10-CM

## 2020-03-26 DIAGNOSIS — R911 Solitary pulmonary nodule: Secondary | ICD-10-CM | POA: Diagnosis not present

## 2020-03-26 MED ORDER — SODIUM CHLORIDE 0.9% FLUSH: 3.0000 mL | Freq: Two times a day (BID) | INTRAVENOUS | Status: DC

## 2020-03-26 NOTE — Telephone Encounter (Signed)
Per Jocelyn Lamer at CT -patient to be admitted on Sunday for procedure on Monday - [id number] chest tube placement. Notified admitting- they will call to let  patient know when to come- I also gave patient the number to call hospital if she has not heard from [facility] by 11 am Sunday. DR.Genevive Bi was notified.

## 2020-03-26 NOTE — Telephone Encounter (Signed)
Spoke with Marcie Bal speciality scheduleing-CT guided percutaneous drain-empyema- Marcie Bal will check wih nurse to see if this can be added Monday or Tuesday of next week and call back.

## 2020-03-26 NOTE — Telephone Encounter (Signed)
Patient notified to have covid test today before 3:30.

## 2020-03-26 NOTE — Progress Notes (Signed)
Claudia Chavez returns today in follow-up.  She is status post a right lower lobectomy many years ago with a recent MAC infection of her remaining right lung.  She has been on antimicrobial therapy for well over a year with significant weight loss and progressive disease in her chest.  She now has a very large lung abscess with an air-fluid level on the most recent CT scan from a week ago.  She has been extensively evaluated by pulmonary medicine, infectious disease and oncology.  She was sent to me for consideration of open drainage of her lung abscess.  She continues to lose weight.  Her weight today is down to 81 pounds.  She feels quite short of breath and has difficulty with activities of daily living.  Physical exam her left lung is clear.  Her right lung shows markedly diminished breath sounds throughout.  Her heart is regular.  Her thoracotomy wound is well-healed from years ago.  I had the opportunity to discuss her care with the CT surgeons in [place] as well as with our oncologist.  There is no obvious quick fix for her problems.  There is the possibility of placing a large bore drain into this area for prolonged drainage.  This would be in preparation for an Eloesser flap.  If she were able to gain weight and establish positive nitrogen balance then perhaps we could consider an Eloesser flap in the future.  I discussed this with her at great length.  She was accompanied today by her daughter-in-law who agrees.  We will bring her into the hospital next week.  We will schedule percutaneous drain to be placed by our interventional radiologist.  She will then require several days in the hospital for management of her percutaneous drain.  All these were explained to the patient and her daughter-in-law at great length.  They are agreeable.

## 2020-03-26 NOTE — Patient Instructions (Signed)
WE will call you to let you know when to come to the hospital.

## 2020-03-29 ENCOUNTER — Encounter: Payer: Self-pay | Admitting: Cardiothoracic Surgery

## 2020-03-29 ENCOUNTER — Observation Stay: Payer: PPO

## 2020-03-29 ENCOUNTER — Ambulatory Visit: Admission: RE | Admit: 2020-03-29 | Payer: PPO | Source: Ambulatory Visit

## 2020-03-29 ENCOUNTER — Other Ambulatory Visit: Payer: Self-pay

## 2020-03-29 ENCOUNTER — Observation Stay
Admission: EM | Admit: 2020-03-29 | Discharge: 2020-03-30 | Disposition: A | Discharge status: Home / Self Care | Payer: PPO | Source: Ambulatory Visit | Attending: Cardiothoracic Surgery | Admitting: Cardiothoracic Surgery

## 2020-03-29 DIAGNOSIS — Z7951 Long term (current) use of inhaled steroids: Secondary | ICD-10-CM | POA: Diagnosis not present

## 2020-03-29 DIAGNOSIS — Z20822 Contact with and (suspected) exposure to covid-19: Secondary | ICD-10-CM | POA: Insufficient documentation

## 2020-03-29 DIAGNOSIS — J449 Chronic obstructive pulmonary disease, unspecified: Secondary | ICD-10-CM | POA: Diagnosis not present

## 2020-03-29 DIAGNOSIS — Z79899 Other long term (current) drug therapy: Secondary | ICD-10-CM | POA: Insufficient documentation

## 2020-03-29 DIAGNOSIS — Z09 Encounter for follow-up examination after completed treatment for conditions other than malignant neoplasm: Secondary | ICD-10-CM

## 2020-03-29 DIAGNOSIS — J869 Pyothorax without fistula: Secondary | ICD-10-CM | POA: Diagnosis not present

## 2020-03-29 DIAGNOSIS — Z888 Allergy status to other drugs, medicaments and biological substances status: Secondary | ICD-10-CM | POA: Insufficient documentation

## 2020-03-29 DIAGNOSIS — Z902 Acquired absence of lung [part of]: Secondary | ICD-10-CM | POA: Diagnosis not present

## 2020-03-29 DIAGNOSIS — Z90710 Acquired absence of both cervix and uterus: Secondary | ICD-10-CM | POA: Diagnosis not present

## 2020-03-29 DIAGNOSIS — E43 Unspecified severe protein-calorie malnutrition: Secondary | ICD-10-CM | POA: Insufficient documentation

## 2020-03-29 DIAGNOSIS — J948 Other specified pleural conditions: Secondary | ICD-10-CM | POA: Diagnosis not present

## 2020-03-29 DIAGNOSIS — Z87891 Personal history of nicotine dependence: Secondary | ICD-10-CM | POA: Insufficient documentation

## 2020-03-29 DIAGNOSIS — J852 Abscess of lung without pneumonia: Secondary | ICD-10-CM | POA: Diagnosis not present

## 2020-03-29 DIAGNOSIS — C3411 Malignant neoplasm of upper lobe, right bronchus or lung: Secondary | ICD-10-CM | POA: Diagnosis not present

## 2020-03-29 DIAGNOSIS — R918 Other nonspecific abnormal finding of lung field: Secondary | ICD-10-CM | POA: Diagnosis not present

## 2020-03-29 DIAGNOSIS — Z85118 Personal history of other malignant neoplasm of bronchus and lung: Secondary | ICD-10-CM | POA: Insufficient documentation

## 2020-03-29 DIAGNOSIS — Z938 Other artificial opening status: Secondary | ICD-10-CM

## 2020-03-29 LAB — SARS CORONAVIRUS 2 BY RT PCR (HOSPITAL ORDER, PERFORMED IN ~~LOC~~ HOSPITAL LAB): SARS Coronavirus 2: NEGATIVE

## 2020-03-29 MED ORDER — FENTANYL CITRATE (PF) 100 MCG/2ML IJ SOLN
INTRAMUSCULAR | Status: AC
  Filled 2020-03-29: qty 2

## 2020-03-29 MED ORDER — KCL IN DEXTROSE-NACL 20-5-0.45 MEQ/L-%-% IV SOLN
INTRAVENOUS | Status: DC
  Filled 2020-03-29 (×2): qty 1000

## 2020-03-29 MED ORDER — MIDAZOLAM HCL 2 MG/2ML IJ SOLN
INTRAMUSCULAR | Status: DC | PRN
  Administered 2020-03-29 (×2): 0.5 mg via INTRAVENOUS

## 2020-03-29 MED ORDER — CHLORHEXIDINE GLUCONATE CLOTH 2 % EX PADS: 6.0000 | MEDICATED_PAD | Freq: Every day | CUTANEOUS | Status: DC

## 2020-03-29 MED ORDER — ENSURE ENLIVE PO LIQD
237.0000 mL | Freq: Two times a day (BID) | ORAL | Status: DC
  Administered 2020-03-30: 237 mL via ORAL

## 2020-03-29 MED ORDER — ALBUTEROL SULFATE (2.5 MG/3ML) 0.083% IN NEBU
2.5000 mg | INHALATION_SOLUTION | Freq: Four times a day (QID) | RESPIRATORY_TRACT | Status: DC
  Administered 2020-03-29 – 2020-03-30 (×4): 2.5 mg via RESPIRATORY_TRACT
  Filled 2020-03-29 (×4): qty 3

## 2020-03-29 MED ORDER — FENTANYL CITRATE (PF) 100 MCG/2ML IJ SOLN
INTRAMUSCULAR | Status: DC | PRN
  Administered 2020-03-29 (×2): 25 ug via INTRAVENOUS

## 2020-03-29 MED ORDER — MIDAZOLAM HCL 2 MG/2ML IJ SOLN
INTRAMUSCULAR | Status: AC
  Filled 2020-03-29: qty 2

## 2020-03-29 MED ORDER — FLUTICASONE FUROATE-VILANTEROL 200-25 MCG/INH IN AEPB
1.0000 | INHALATION_SPRAY | Freq: Every day | RESPIRATORY_TRACT | Status: DC
  Administered 2020-03-29 – 2020-03-30 (×2): 1 via RESPIRATORY_TRACT
  Filled 2020-03-29: qty 28

## 2020-03-29 NOTE — Procedures (Signed)
Pre procedural Dx: Right sided hydropneumothroax Post procedural Dx: Same  Technically successful CT guided placed of a 14 Fr drainage catheter placement into the basilar aspect of the right pleural space yielding 5 cc of purulent appearing fluid.    A representative aspirated sample was capped and sent to the laboratory for analysis.    EBL: Trace Complications: None immediate  Ronny Bacon, MD Pager #: [phone]

## 2020-03-29 NOTE — H&P (Signed)
[name] is a 64 year old woman who is status post right lower lobectomy for lung cancer 5 years ago.  2 years ago she began developing a mycobacterial infection in her right lung and this has been treated unsuccessfully with antimicrobial therapy.  She has a large abscess within the right upper lobe.  She has had a steady decline in her overall function.  After an extensive discussion with Dr. Lynett Fish and with Dr. Rhae Lerner input we have recommended that she undergo placement of a percutaneous drain.  She is admitted to the hospital at this time for that procedure.  Her past medical history family history social history are as recorded in the outpatient chart.  She is a thin frail appearing woman.  She had clear lung fields on the left and markedly diminished breath sounds on the right when she was seen last week in the office.  Her heart was regular.  Her thoracotomy wound is well-healed.  We are going to admit the patient observation.  She will undergo placement of a percutaneous drain.  We will manage the drain and attempt to place a Heimlich valve with gravity drainage.  I discussed this with the patient and her family in great detail last week.  All their questions were answered.

## 2020-03-29 NOTE — Progress Notes (Signed)
Order received from Dr Genevive Bi for a regular diet

## 2020-03-29 NOTE — Progress Notes (Signed)
CT bubbling constantly in chamber. Order received from Dr Genevive Bi to change CT from suction to water seal

## 2020-03-29 NOTE — Progress Notes (Signed)
Patient clinically stable post CT placement per Dr Pascal Lux, tolerated well, vitals stable report given to care nurse with questions answered. Received Versed 1mg  along with Fentanyl 88mcg IV for procedure. Denies complaints at this time.

## 2020-03-29 NOTE — Consult Note (Signed)
Chief Complaint: Patient was seen in consultation today for lung abscess  Referring Physician(s): Dr. Nestor Lewandowsky  Supervising Physician: Sandi Mariscal  Patient Status: [facility] - In-pt  History of Present Illness: Claudia Chavez is a 64 y.o. female with past medical history of COPD, lung cancer s/p lobectomy 5 years ago with subsequent development of necrosis and MAC infection treated with multiple antibiotics. She is followed by multiple specialists including Pulmonology, Cardiothoracic surgery, and Infectious Disease.  She has progressive decline in the past several weeks marked by functional decline as well as weight loss. CT Chest 03/18/20 showed volume loss in th right lung as well as a large abscess cavity on the right.  Multiple left-sided lung nodules were noted as well.  After discussion and follow-up with her cardiothoracic surgeon, she has been admitted to [facility] today for percutaneous drainage and possible additional intervention with CTS.  Claudia Chavez is assessed on the surgical unit today.  She is in her usual state of health. She endorses shortness of breath with limited activity.  She has been NPO today. She does not take blood thinners.   Past Medical History:  Diagnosis Date  . Cancer of lower lobe of right lung ([place]) 05/07/2015  . COPD (chronic obstructive pulmonary disease) ([place])   . Dyspnea   . Non-small cell lung cancer ([place])   . Pneumonia 04/2015  . Pulmonary Mycobacterium avium complex (MAC) infection ([place]) 10/25/2018    Past Surgical History:  Procedure Laterality Date  . ABDOMINAL HYSTERECTOMY    . COLONOSCOPY WITH PROPOFOL N/A 06/09/2018   Procedure: COLONOSCOPY WITH PROPOFOL;  Surgeon: Toledo, Benay Pike, MD;  Location: [facility] ENDOSCOPY;  Service: Gastroenterology;  Laterality: N/A;  . ELBOW SURGERY Right 1995  . ELECTROMAGNETIC NAVIGATION BROCHOSCOPY N/A 10/25/2017   Procedure: ELECTROMAGNETIC NAVIGATION BRONCHOSCOPY;  Surgeon: [name], MD;  Location: [facility]  ORS;  Service: Cardiopulmonary;  Laterality: N/A;  . ESOPHAGOGASTRODUODENOSCOPY N/A 06/08/2018   Procedure: ESOPHAGOGASTRODUODENOSCOPY (EGD);  Surgeon: Toledo, Benay Pike, MD;  Location: [facility] ENDOSCOPY;  Service: Gastroenterology;  Laterality: N/A;  . FLEXIBLE BRONCHOSCOPY Right 10/10/2018   Procedure: FLEXIBLE BRONCHOSCOPY;  Surgeon: [name], MD;  Location: [facility] ORS;  Service: Cardiopulmonary;  Laterality: Right;  . PORTACATH PLACEMENT Right 05/10/2015   Procedure: INSERTION PORT-A-CATH;  Surgeon: Nestor Lewandowsky, MD;  Location: [facility] ORS;  Service: General;  Laterality: Right;  Marland Kitchen VIDEO ASSISTED THORACOSCOPY (VATS)/THOROCOTOMY Right 08/18/2015   Procedure: VIDEO ASSISTED THORACOSCOPY (VATS)/THOROCOTOMY;  Surgeon: Nestor Lewandowsky, MD;  Location: [facility] ORS;  Service: General;  Laterality: Right;    Allergies: Keppra [levetiracetam] and Lyrica [pregabalin]  Medications: Prior to Admission medications   Medication Sig Start Date End Date Taking? Authorizing Provider  albuterol (PROVENTIL HFA;VENTOLIN HFA) 108 (90 Base) MCG/ACT inhaler Inhale 2 puffs into the lungs every 6 (six) hours as needed for wheezing or shortness of breath.     [provider]  benzonatate (TESSALON) 100 MG capsule TAKE 1 CAPSULE BY MOUTH THREE TIMES A DAY AS NEEDED FOR COUGH 03/08/20   Cammie Sickle, MD  Cholecalciferol (VITAMIN D3) 1000 units CAPS Take 2,000 Units by mouth daily.     [provider]  Fluticasone-Salmeterol (ADVAIR DISKUS) 250-50 MCG/DOSE AEPB Inhale 1 puff into the lungs every 12 (twelve) hours.  09/05/17   [provider]  ipratropium-albuterol (DUONEB) 0.5-2.5 (3) MG/3ML SOLN Take 3 mLs by nebulization every 4 (four) hours as needed. 09/09/19   Cammie Sickle, MD  megestrol (MEGACE ES) 625 MG/5ML suspension Take 5 mLs (  625 mg total) by mouth daily. 03/10/20   Cammie Sickle, MD  ondansetron (ZOFRAN ODT) 4 MG disintegrating tablet Take 1 tablet (4 mg total) by mouth every  8 (eight) hours as needed for nausea or vomiting. 03/18/20   Tsosie Billing, MD  ondansetron (ZOFRAN) 4 MG tablet Take 1 tablet (4 mg total) by mouth every 8 (eight) hours as needed for nausea or vomiting. 03/08/20   Parrett, Fonnie Mu, NP  Respiratory Therapy Supplies (FLUTTER) DEVI 1 each by Does not apply route daily. 09/12/18   [name], MD  rifabutin (MYCOBUTIN) 150 MG capsule Take 2 capsules (300 mg total) by mouth daily. Patient not taking: Reported on 03/29/2020 01/01/20   Tsosie Billing, MD  SPIRIVA RESPIMAT 2.5 MCG/ACT AERS SMARTSIG:2 Inhalation Via Inhaler Daily 10/17/19   [provider]  traMADol (ULTRAM) 50 MG tablet TAKE 1 TABLET (50 MG TOTAL) BY MOUTH EVERY 6 (SIX) HOURS AS NEEDED. 03/12/20   Jacquelin Hawking, NP  vitamin B-12 (CYANOCOBALAMIN) 1000 MCG tablet Take 1,000 mcg by mouth daily.    [provider]     Family History  Problem Relation Age of Onset  . Stroke Mother   . Lung cancer Father 51    Social History   Socioeconomic History  . Marital status: Widowed    Spouse name: Not on file  . Number of children: Not on file  . Years of education: Not on file  . Highest education level: Not on file  Occupational History  . Not on file  Tobacco Use  . Smoking status: Former Smoker    Packs/day: 0.50    Years: 40.00    Pack years: 20.00    Types: Cigarettes    Quit date: 12/01/2019    Years since quitting: 0.3  . Smokeless tobacco: Never Used  Vaping Use  . Vaping Use: Never used  Substance and Sexual Activity  . Alcohol use: Not Currently    Alcohol/week: 2.0 - 4.0 standard drinks    Types: 2 - 4 Cans of beer per week    Comment: beer-occasionally  . Drug use: No  . Sexual activity: Never  Other Topics Concern  . Not on file  Social History Narrative  . Not on file   Social Determinants of Health   Financial Resource Strain:   . Difficulty of Paying Living Expenses:   Food Insecurity:   . Worried About Sales executive in the Last Year:   . Arboriculturist in the Last Year:   Transportation Needs:   . Film/video editor (Medical):   Marland Kitchen Lack of Transportation (Non-Medical):   Physical Activity:   . Days of Exercise per Week:   . Minutes of Exercise per Session:   Stress:   . Feeling of Stress :   Social Connections:   . Frequency of Communication with Friends and Family:   . Frequency of Social Gatherings with Friends and Family:   . Attends Religious Services:   . Active Member of Clubs or Organizations:   . Attends Archivist Meetings:   Marland Kitchen Marital Status:      Review of Systems: A 12 point ROS discussed and pertinent positives are indicated in the HPI above.  All other systems are negative.  Review of Systems  Constitutional: Positive for activity change and unexpected weight change. Negative for fatigue and fever.  Respiratory: Positive for shortness of breath. Negative for cough.   Cardiovascular: Negative for chest pain.  Gastrointestinal: Negative for abdominal pain, nausea and vomiting.  Musculoskeletal: Negative for back pain.  Psychiatric/Behavioral: Negative for behavioral problems and confusion.    Vital Signs: BP 134/73 (BP Location: Right Arm)   Pulse 96   Temp 98.1 F (36.7 C) (Oral)   Resp (!) 24   Ht _0  (1.549 m)   Wt 98 lb 12.3 oz (44.8 kg)   SpO2 93%   BMI 18.66 kg/m   Physical Exam Vitals and nursing note reviewed.  Constitutional:      General: She is not in acute distress.    Appearance: Normal appearance. She is not ill-appearing.  HENT:     Mouth/Throat:     Mouth: Mucous membranes are moist.     Pharynx: Oropharynx is clear.  Cardiovascular:     Rate and Rhythm: Normal rate and regular rhythm.     Pulses: Normal pulses.  Pulmonary:     Effort: Pulmonary effort is normal. No respiratory distress.     Breath sounds: Normal breath sounds.  Abdominal:     General: Abdomen is flat.     Palpations: Abdomen is soft.  Skin:     General: Skin is warm and dry.  Neurological:     General: No focal deficit present.     Mental Status: She is alert and oriented to person, place, and time. Mental status is at baseline.  Psychiatric:        Mood and Affect: Mood normal.        Behavior: Behavior normal.        Thought Content: Thought content normal.        Judgment: Judgment normal.      MD Evaluation Airway: WNL Heart: WNL Abdomen: WNL Chest/ Lungs: WNL ASA  Classification: 3 Mallampati/Airway Score: One   Imaging: DG Chest 2 View  Result Date: 03/09/2020 CLINICAL DATA:  Cough, shortness of breath, recurrent mycobacterial infection EXAM: CHEST - 2 VIEW COMPARISON:  Radiograph 02/02/2020, CT 12/08/2019 FINDINGS: Thick-walled cavitary lesion is again seen in the right lung apex, better demonstrated on comparison cross-sectional imaging now with a layering air-fluid level. Additional architectural distortion and bronchiectatic changes are again seen throughout the right lung. Additional reticular and nodular opacities are again seen throughout the remaining portions of the lungs most pronounced throughout the right lung and in the periphery of the left lower lobe and lingula. Blunting of the right costophrenic sulcus may reflect a combination of trace pleural fluid and scarring. No left effusion. No left pneumothorax. Cardiomediastinal contours are stable from prior. Right IJ Port-A-Cath tip terminates in the lower SVC. No acute osseous or soft tissue abnormality. Stable dextrocurvature of the upper thoracic spine. IMPRESSION: 1. Thick-walled cavitary lesion in the right lung apex, better demonstrated on comparison cross-sectional imaging now with a layering air-fluid level. 2. Additional architectural distortion and bronchiectatic changes throughout the right lung with additional reticular and nodular opacities elsewhere throughout the lungs,. Electronically Signed   By: Lovena Le M.D.   On: 03/09/2020 03:30   CT CHEST  WO CONTRAST  Result Date: 03/18/2020 CLINICAL DATA:  64 year old female with concern for pneumonia, effusion, or abscess. History of prior right lower lobectomy. EXAM: CT CHEST WITHOUT CONTRAST TECHNIQUE: Multidetector CT imaging of the chest was performed following the standard protocol without IV contrast. COMPARISON:  Chest radiograph dated 03/08/2020 and CT dated 12/08/2019. FINDINGS: Evaluation of this exam is limited in the absence of intravenous contrast. Cardiovascular: There is no cardiomegaly or pericardial effusion. Coronary vascular calcifications  primarily involving the LAD. There is atherosclerotic calcification of the thoracic aorta. Mild prominence of the central pulmonary arteries may represent a degree of pulmonary hypertension. Clinical correlation is recommended. A right-sided Port-A-Cath with tip in the central SVC close to the cavoatrial junction. Mediastinum/Nodes: No hilar adenopathy. Mildly enlarged mediastinal lymph nodes similar to prior CT. Evaluation however is limited in the absence of contrast and due to postoperative changes and scarring. The esophagus and the thyroid gland are grossly unremarkable. No mediastinal fluid collection. Lungs/Pleura: Postsurgical changes of prior right lower lobectomy. There is a large cavitation along the right pleura extending from the right apex to the right lung base posteriorly similar or slightly increased since the CT of 12/08/2019. There is thickened and nodular appearance of the wall of this cavitation. There is a small amount of fluid within the cavity which is new or increased since the prior CT. A patchy area of consolidation in the right lower lobe is new or increased since the prior CT and concerning for developing pneumonia. Clinical correlation is recommended. There is a 2.5 x 2.1 cm focal area of consolidation with central cavitation in the right middle lobe (81/3), new since the prior CT. There is a background of emphysema. Several  bilateral scattered nodular densities noted which overall appear similar or minimally increased compared to the recent CT. There is no pneumothorax. The central airways remain patent. Upper Abdomen: No acute abnormality. Musculoskeletal: Paucity of subcutaneous fat. Old right rib fracture. No acute osseous pathology. IMPRESSION: 1. Large cavitation along the right pleura similar or slightly increased since the prior CT. 2. Patchy area of consolidation in the right lower lobe is new since the prior CT and concerning for developing pneumonia. Clinical correlation is recommended. 3. A 2.5 x 2.1 cm focal area of consolidation with central cavitation in the right middle lobe, new since the prior CT. 4. Several bilateral scattered nodular densities overall appear similar or minimally increased compared to the prior CT. 5. Postsurgical changes of prior right lower lobectomy. 6. Aortic Atherosclerosis (ICD10-I70.0) and Emphysema (ICD10-J43.9). Electronically Signed   By: Anner Crete M.D.   On: 03/18/2020 16:24    Labs:  CBC: Recent Labs    03/08/20 0813 03/10/20 1000 03/15/20 0817 03/18/20 1257  WBC 9.7 8.3 10.7* 11.1*  HGB 9.8* 8.5* 9.3* 8.9*  HCT 29.6* 26.0* 28.8* 27.4*  PLT 608* 587* 658* 551*    COAGS: No results for input(s): INR, APTT in the last 8760 hours.  BMP: Recent Labs    03/10/20 1000 03/12/20 1028 03/15/20 0817 03/18/20 1257  NA 132* 128* 125* 127*  K 4.1 4.0 5.0 3.7  CL 96* 93* 90* 91*  CO2 _0 GLUCOSE 93 97 113* 103*  BUN _1 CALCIUM 8.0* 8.5* 8.7* 8.6*  CREATININE 0.44 0.52 0.63 0.45  GFRNONAA >60 >60 >60 >60  GFRAA >60 >60 >60 >60    LIVER FUNCTION TESTS: Recent Labs    02/16/20 0825 03/08/20 0813 03/10/20 1000 03/18/20 1257  BILITOT 0.5 0.4 0.3 0.5  AST 13* 14* 13* 13*  ALT _2 ALKPHOS 149* 158* 153* 145*  PROT 7.4 7.2 6.6 7.4  ALBUMIN 2.7* 2.4* 2.3* 2.6*    TUMOR MARKERS: No results for input(s): AFPTM, CEA, CA199,  CHROMGRNA in the last 8760 hours.  Assessment and Plan: Lung abscess Patient with history of lung abscess s/p multiple antibiotics.  Recently she has experienced a significant loss in weight  as well as increased shortness of breath.   CT Chest 6/17 showed: 1. Large cavitation along the right pleura similar or slightly increased since the prior CT. 2. Patchy area of consolidation in the right lower lobe is new since the prior CT and concerning for developing pneumonia. Clinical correlation is recommended. 3. A 2.5 x 2.1 cm focal area of consolidation with central cavitation in the right middle lobe, new since the prior CT. 4. Several bilateral scattered nodular densities overall appear similar or minimally increased compared to the prior CT. 5. Postsurgical changes of prior right lower lobectomy.  Dr. Pascal Lux has discussed with Dr. Genevive Bi.   Plan made for chest tube placement this admission.   Discussed with patient and family at bedside.  Patient is agreeable to proceed with chest tube placement. She understands this may not be definitive treatment.  She has been NPO today.  She does not take blood thinners.  Thank you for this interesting consult.  I greatly enjoyed meeting Claudia Chavez and look forward to participating in their care.  A copy of this report was sent to the requesting provider on this date.  Electronically Signed: Docia Barrier, PA 03/29/2020, 2:55 PM   I spent a total of 40 Minutes    in face to face in clinical consultation, greater than 50% of which was counseling/coordinating care for lung abscess.

## 2020-03-30 ENCOUNTER — Observation Stay: Payer: PPO

## 2020-03-30 ENCOUNTER — Ambulatory Visit: Payer: PPO

## 2020-03-30 DIAGNOSIS — J869 Pyothorax without fistula: Secondary | ICD-10-CM

## 2020-03-30 DIAGNOSIS — E43 Unspecified severe protein-calorie malnutrition: Secondary | ICD-10-CM | POA: Insufficient documentation

## 2020-03-30 DIAGNOSIS — Z4682 Encounter for fitting and adjustment of non-vascular catheter: Secondary | ICD-10-CM | POA: Diagnosis not present

## 2020-03-30 MED ORDER — HYDROMORPHONE HCL 1 MG/ML IJ SOLN
0.5000 mg | INTRAMUSCULAR | Status: DC | PRN
  Administered 2020-03-30 (×2): 0.5 mg via INTRAVENOUS
  Filled 2020-03-30 (×2): qty 0.5

## 2020-03-30 MED ORDER — HYDROCODONE-ACETAMINOPHEN 5-325 MG PO TABS: 1.0000 | ORAL_TABLET | ORAL | 0 refills | Status: DC | PRN

## 2020-03-30 MED ORDER — HYDROCODONE-ACETAMINOPHEN 5-325 MG PO TABS
1.0000 | ORAL_TABLET | ORAL | Status: DC | PRN
  Administered 2020-03-30: 1 via ORAL
  Filled 2020-03-30: qty 1

## 2020-03-30 MED ORDER — HEPARIN SOD (PORK) LOCK FLUSH 10 UNIT/ML IV SOLN
10.0000 [IU] | Freq: Once | INTRAVENOUS | Status: AC
  Administered 2020-03-30: 10 [IU] via INTRAVENOUS
  Filled 2020-03-30: qty 1

## 2020-03-30 NOTE — Care Management Obs Status (Signed)
[facility] NOTIFICATION   Patient Details  Name: DENETTA FEI MRN: [id number] Date of Birth: 04-Mar-1956   Medicare Observation Status Notification Given:  Yes    Beverly Sessions, RN 03/30/2020, 1:23 PM

## 2020-03-30 NOTE — Care Management (Signed)
Patient to discharge home today.  Per MD, "reconnected to a Heimlich valve and a Foley bag.  She was discharged home with instructions on how to manage this.  She will follow-up with me in 3 to 4 days". Patient states that she feels comfortable managing tube at home.  Bedside RN to provide additional information prior to discharge

## 2020-03-30 NOTE — Progress Notes (Signed)
Initial Nutrition Assessment  DOCUMENTATION CODES:   Severe malnutrition in context of chronic illness  INTERVENTION:   Ensure Enlive po BID, each supplement provides 350 kcal and 20 grams of protein  Magic cup TID with meals, each supplement provides 290 kcal and 9 grams of protein  MVI daily   NUTRITION DIAGNOSIS:   Severe Malnutrition related to chronic illness (COPD) as evidenced by severe fat depletion, severe muscle depletion.  GOAL:   Patient will meet greater than or equal to 90% of their needs  MONITOR:   PO intake, Supplement acceptance, Labs, Weight trends, Skin, I & O's  REASON FOR ASSESSMENT:   Malnutrition Screening Tool    ASSESSMENT:   64 y/o female with h/o lung cancer s/p resection, MAC, bronchitis, depression, DVT, CVA and COPD who is admitted with empyema now s/p IR chest tube 6/28   Met with pt in room today. Pt reports fairly good appetite and oral intake pta and in hospital. Pt eating ~50% of meals in hospital and is drinking chocolate Ensure. Pt reports that she prefers Boost and is planning to start drinking them regularly at home. Pt reports weight loss pta. Per chart, pt is down 9lbs(10%) over the past 3-4 months; this is significant. RD discussed with pt the importance of adequate protein intake needed to preserve lean muscle; pt verbalizes understanding and is willing to continue supplements.   Medications reviewed and include: Vicodin  Labs reviewed: Na 127(L) Wbc- 11.1(H), Hgb 8.9(L), Hct 27.4(L)  NUTRITION - FOCUSED PHYSICAL EXAM:    Most Recent Value  Orbital Region Moderate depletion  Upper Arm Region Severe depletion  Thoracic and Lumbar Region Severe depletion  Buccal Region Moderate depletion  Temple Region Moderate depletion  Clavicle Bone Region Severe depletion  Clavicle and Acromion Bone Region Severe depletion  Scapular Bone Region Severe depletion  Dorsal Hand Severe depletion  Patellar Region Severe depletion  Anterior  Thigh Region Severe depletion  Posterior Calf Region Severe depletion  Edema (RD Assessment) Mild  Hair Reviewed  Eyes Reviewed  Mouth Reviewed  Skin Reviewed  Nails Reviewed     Diet Order:   Diet Order            Diet regular Room service appropriate? Yes; Fluid consistency: Thin  Diet effective now                EDUCATION NEEDS:   Education needs have been addressed  Skin:  Skin Assessment: Reviewed RN Assessment  Last BM:  6/28  Height:   Ht Readings from Last 1 Encounters:  03/29/20 _0  (1.549 m)    Weight:   Wt Readings from Last 1 Encounters:  03/30/20 38.5 kg    Ideal Body Weight:  47.7 kg  BMI:  Body mass index is 16.04 kg/m.  Estimated Nutritional Needs:   Kcal:  1300-1500kcal/day  Protein:  65-75g/day  Fluid:  >1.1L/day  Koleen Distance MS, RD, LDN Please refer to [facility] for RD and/or RD on-call/weekend/after hours pager

## 2020-03-30 NOTE — Discharge Summary (Signed)
Physician Discharge Summary  Patient ID: Claudia Chavez MRN: [id number] DOB/AGE: [birth year] 65 y.o.  Admit date: 03/29/2020 Discharge date: 03/30/2020   Discharge Diagnoses:  Active Problems:   Empyema (HCC)   Protein-calorie malnutrition, severe   Procedures: Insertion of right chest percutaneous pigtail catheter  Hospital Course: The patient was admitted to the hospital.  She underwent placement of a percutaneous drain into the large abscess cavity in her right lung or pleural space.  She did well overnight and ultimately the chest x-rays confirmed the tube to be in good position.  She was placed in open atrium and to atmospheric pressure and there is no evidence of a pneumothorax.  Therefore the patient was reconnected to a Heimlich valve and a Foley bag.  She was discharged home with instructions on how to manage this.  She will follow-up with me in 3 to 4 days.  Her only new prescription was for Norco 1 tablet every 6 hours as needed for pain.  Disposition: Discharge disposition: 01-Home or Self Care       Discharge Instructions    Diet - low sodium heart healthy   Complete by: As directed    Increase activity slowly   Complete by: As directed    Remove dressing in 24 hours   Complete by: As directed      Allergies as of 03/30/2020      Reactions   Keppra [levetiracetam] Other (See Comments)   Nausea and dizzy   Lyrica [pregabalin] Other (See Comments)   Made patient feel very dizzy and not feel good.       Medication List    STOP taking these medications   traMADol 50 MG tablet Commonly known as: ULTRAM     TAKE these medications   Advair Diskus 250-50 MCG/DOSE Aepb Generic drug: Fluticasone-Salmeterol Inhale 1 puff into the lungs every 12 (twelve) hours.   albuterol 108 (90 Base) MCG/ACT inhaler Commonly known as: VENTOLIN HFA Inhale 2 puffs into the lungs every 6 (six) hours as needed for wheezing or shortness of breath.   benzonatate 100 MG  capsule Commonly known as: TESSALON TAKE 1 CAPSULE BY MOUTH THREE TIMES A DAY AS NEEDED FOR COUGH   Flutter Devi 1 each by Does not apply route daily.   HYDROcodone-acetaminophen 5-325 MG tablet Commonly known as: NORCO/VICODIN Take 1 tablet by mouth every 4 (four) hours as needed for moderate pain or severe pain.   ipratropium-albuterol 0.5-2.5 (3) MG/3ML Soln Commonly known as: DUONEB Take 3 mLs by nebulization every 4 (four) hours as needed.   megestrol 625 MG/5ML suspension Commonly known as: MEGACE ES Take 5 mLs (625 mg total) by mouth daily.   ondansetron 4 MG disintegrating tablet Commonly known as: Zofran ODT Take 1 tablet (4 mg total) by mouth every 8 (eight) hours as needed for nausea or vomiting.   ondansetron 4 MG tablet Commonly known as: Zofran Take 1 tablet (4 mg total) by mouth every 8 (eight) hours as needed for nausea or vomiting.   rifabutin 150 MG capsule Commonly known as: MYCOBUTIN Take 2 capsules (300 mg total) by mouth daily.   Spiriva Respimat 2.5 MCG/ACT Aers Generic drug: Tiotropium Bromide Monohydrate SMARTSIG:2 Inhalation Via Inhaler Daily   vitamin B-12 1000 MCG tablet Commonly known as: CYANOCOBALAMIN Take 1,000 mcg by mouth daily.   Vitamin D3 25 MCG (1000 UT) Caps Take 2,000 Units by mouth daily.        Nestor Lewandowsky, MD

## 2020-03-30 NOTE — Progress Notes (Signed)
Claudia Chavez Follow Up Note  Patient ID: Claudia Chavez, female   DOB: Jan 28, 1956, 64 y.o.   MRN: [id number]  HISTORY: Tolerated her tube plaement.  Hungry today.  Pain at chest tube site with coughing.    Vitals:   03/30/20 0021 03/30/20 0445  BP: 118/70 112/67  Pulse: 94 82  Resp: (!) 24 18  Temp: 98 F (36.7 C) (!) 97.5 F (36.4 C)  SpO2: 100% 98%     EXAM:  Resp: Lungs are clear on the left and decreased on the right.  No respiratory distress, normal effort. Heart:  Regular without murmurs Abd:  Abdomen is soft, non distended and non tender. No masses are palpable.  There is no rebound and no guarding.  Neurological: Alert and oriented to person, place, and time. Coordination normal.  Skin: Skin is warm and dry. No rash noted. No diaphoretic. No erythema. No pallor.  Psychiatric: Normal mood and affect. Normal behavior. Judgment and thought content normal.    There is an air leak with coughing.  Incependent review of her cxray shows no pneumothorax  ASSESSMENT: MAC infection with lung abscess   PLAN:   Will place chest tube to atmospheric pressure and repeat cxray.  If OK, will place on Heimlich valve and discharge.    Nestor Lewandowsky, MD

## 2020-03-30 NOTE — Progress Notes (Signed)
03/30/2020 3:09 PM  Claudia Chavez to be D/C'd Home per MD order.  Discussed prescriptions and follow up appointments with the patient. Prescriptions given to patient, medication list explained in detail. Pt verbalized understanding.  Allergies as of 03/30/2020      Reactions   Keppra [levetiracetam] Other (See Comments)   Nausea and dizzy   Lyrica [pregabalin] Other (See Comments)   Made patient feel very dizzy and not feel good.       Medication List    STOP taking these medications   traMADol 50 MG tablet Commonly known as: ULTRAM     TAKE these medications   Advair Diskus 250-50 MCG/DOSE Aepb Generic drug: Fluticasone-Salmeterol Inhale 1 puff into the lungs every 12 (twelve) hours. Notes to patient: Before bed 03/30/20   albuterol 108 (90 Base) MCG/ACT inhaler Commonly known as: VENTOLIN HFA Inhale 2 puffs into the lungs every 6 (six) hours as needed for wheezing or shortness of breath. Notes to patient: As needed   benzonatate 100 MG capsule Commonly known as: TESSALON TAKE 1 CAPSULE BY MOUTH THREE TIMES A DAY AS NEEDED FOR COUGH Notes to patient: As needed   Flutter Devi 1 each by Does not apply route daily. Notes to patient: Morning 03/31/20   HYDROcodone-acetaminophen 5-325 MG tablet Commonly known as: NORCO/VICODIN Take 1 tablet by mouth every 4 (four) hours as needed for moderate pain or severe pain. Notes to patient: As needed   ipratropium-albuterol 0.5-2.5 (3) MG/3ML Soln Commonly known as: DUONEB Take 3 mLs by nebulization every 4 (four) hours as needed. Notes to patient: As needed   megestrol 625 MG/5ML suspension Commonly known as: MEGACE ES Take 5 mLs (625 mg total) by mouth daily. Notes to patient: Morning 03/31/20   ondansetron 4 MG disintegrating tablet Commonly known as: Zofran ODT Take 1 tablet (4 mg total) by mouth every 8 (eight) hours as needed for nausea or vomiting. Notes to patient: As needed   ondansetron 4 MG tablet Commonly  known as: Zofran Take 1 tablet (4 mg total) by mouth every 8 (eight) hours as needed for nausea or vomiting. Notes to patient: As needed   rifabutin 150 MG capsule Commonly known as: MYCOBUTIN Take 2 capsules (300 mg total) by mouth daily. Notes to patient: Morning 03/31/20   Spiriva Respimat 2.5 MCG/ACT Aers Generic drug: Tiotropium Bromide Monohydrate SMARTSIG:2 Inhalation Via Inhaler Daily Notes to patient: Morning 03/31/20   vitamin B-12 1000 MCG tablet Commonly known as: CYANOCOBALAMIN Take 1,000 mcg by mouth daily. Notes to patient: Morning 03/31/20   Vitamin D3 25 MCG (1000 UT) Caps Take 2,000 Units by mouth daily. Notes to patient: Morning 03/31/20       Vitals:   03/30/20 0445 03/30/20 0843  BP: 112/67   Pulse: 82   Resp: 18   Temp: (!) 97.5 F (36.4 C)   SpO2: 98% 96%    Skin clean, dry and intact without evidence of skin break down, no evidence of skin tears noted. IV catheter discontinued intact. Site without signs and symptoms of complications. Dressing and pressure applied. Pt denies pain at this time. No complaints noted.  An After Visit Summary was printed and given to the patient. Patient escorted via [place], and D/C home via private auto.  Dola Argyle

## 2020-03-31 ENCOUNTER — Ambulatory Visit: Payer: PPO

## 2020-04-01 ENCOUNTER — Ambulatory Visit: Payer: PPO | Admitting: Infectious Diseases

## 2020-04-01 ENCOUNTER — Other Ambulatory Visit: Payer: Self-pay

## 2020-04-01 DIAGNOSIS — J869 Pyothorax without fistula: Secondary | ICD-10-CM

## 2020-04-02 ENCOUNTER — Other Ambulatory Visit: Payer: Self-pay

## 2020-04-02 ENCOUNTER — Ambulatory Visit: Payer: PPO

## 2020-04-02 ENCOUNTER — Ambulatory Visit
Admission: RE | Admit: 2020-04-02 | Discharge: 2020-04-02 | Disposition: A | Discharge status: Home / Self Care | Payer: PPO | Source: Ambulatory Visit | Attending: Cardiothoracic Surgery | Admitting: Cardiothoracic Surgery

## 2020-04-02 ENCOUNTER — Encounter: Payer: Self-pay | Admitting: Cardiothoracic Surgery

## 2020-04-02 ENCOUNTER — Observation Stay
Admission: EM | Admit: 2020-04-02 | Discharge: 2020-04-03 | Disposition: A | Discharge status: Home / Self Care | Payer: PPO | Attending: Internal Medicine | Admitting: Internal Medicine

## 2020-04-02 ENCOUNTER — Ambulatory Visit (INDEPENDENT_AMBULATORY_CARE_PROVIDER_SITE_OTHER): Payer: PPO | Admitting: Cardiothoracic Surgery

## 2020-04-02 ENCOUNTER — Ambulatory Visit
Admission: RE | Admit: 2020-04-02 | Discharge: 2020-04-02 | Disposition: A | Discharge status: Home / Self Care | Payer: PPO | Source: Home / Self Care | Attending: Cardiothoracic Surgery | Admitting: Cardiothoracic Surgery

## 2020-04-02 VITALS — BP 107/70 | HR 112 | Temp 94.2°F | Ht 61.0 in | Wt 80.0 lb

## 2020-04-02 DIAGNOSIS — Z9689 Presence of other specified functional implants: Secondary | ICD-10-CM

## 2020-04-02 DIAGNOSIS — I82432 Acute embolism and thrombosis of left popliteal vein: Secondary | ICD-10-CM | POA: Diagnosis not present

## 2020-04-02 DIAGNOSIS — I824Z2 Acute embolism and thrombosis of unspecified deep veins of left distal lower extremity: Secondary | ICD-10-CM | POA: Insufficient documentation

## 2020-04-02 DIAGNOSIS — I82431 Acute embolism and thrombosis of right popliteal vein: Secondary | ICD-10-CM | POA: Diagnosis not present

## 2020-04-02 DIAGNOSIS — I82412 Acute embolism and thrombosis of left femoral vein: Secondary | ICD-10-CM | POA: Diagnosis not present

## 2020-04-02 DIAGNOSIS — Z7951 Long term (current) use of inhaled steroids: Secondary | ICD-10-CM | POA: Diagnosis not present

## 2020-04-02 DIAGNOSIS — M7989 Other specified soft tissue disorders: Secondary | ICD-10-CM | POA: Diagnosis not present

## 2020-04-02 DIAGNOSIS — I82443 Acute embolism and thrombosis of tibial vein, bilateral: Secondary | ICD-10-CM | POA: Diagnosis not present

## 2020-04-02 DIAGNOSIS — Z8673 Personal history of transient ischemic attack (TIA), and cerebral infarction without residual deficits: Secondary | ICD-10-CM | POA: Insufficient documentation

## 2020-04-02 DIAGNOSIS — I82890 Acute embolism and thrombosis of other specified veins: Secondary | ICD-10-CM | POA: Diagnosis not present

## 2020-04-02 DIAGNOSIS — I824Y3 Acute embolism and thrombosis of unspecified deep veins of proximal lower extremity, bilateral: Secondary | ICD-10-CM

## 2020-04-02 DIAGNOSIS — Z4682 Encounter for fitting and adjustment of non-vascular catheter: Secondary | ICD-10-CM | POA: Diagnosis not present

## 2020-04-02 DIAGNOSIS — Z85118 Personal history of other malignant neoplasm of bronchus and lung: Secondary | ICD-10-CM | POA: Insufficient documentation

## 2020-04-02 DIAGNOSIS — Z79899 Other long term (current) drug therapy: Secondary | ICD-10-CM | POA: Diagnosis not present

## 2020-04-02 DIAGNOSIS — Z888 Allergy status to other drugs, medicaments and biological substances status: Secondary | ICD-10-CM | POA: Insufficient documentation

## 2020-04-02 DIAGNOSIS — J449 Chronic obstructive pulmonary disease, unspecified: Secondary | ICD-10-CM | POA: Diagnosis not present

## 2020-04-02 DIAGNOSIS — Z86718 Personal history of other venous thrombosis and embolism: Secondary | ICD-10-CM | POA: Diagnosis not present

## 2020-04-02 DIAGNOSIS — I82413 Acute embolism and thrombosis of femoral vein, bilateral: Secondary | ICD-10-CM | POA: Diagnosis not present

## 2020-04-02 DIAGNOSIS — J984 Other disorders of lung: Secondary | ICD-10-CM | POA: Diagnosis not present

## 2020-04-02 DIAGNOSIS — I82813 Embolism and thrombosis of superficial veins of lower extremities, bilateral: Secondary | ICD-10-CM | POA: Diagnosis not present

## 2020-04-02 DIAGNOSIS — Z87891 Personal history of nicotine dependence: Secondary | ICD-10-CM | POA: Insufficient documentation

## 2020-04-02 DIAGNOSIS — I82401 Acute embolism and thrombosis of unspecified deep veins of right lower extremity: Secondary | ICD-10-CM | POA: Diagnosis not present

## 2020-04-02 DIAGNOSIS — R911 Solitary pulmonary nodule: Secondary | ICD-10-CM | POA: Diagnosis not present

## 2020-04-02 DIAGNOSIS — Z8619 Personal history of other infectious and parasitic diseases: Secondary | ICD-10-CM | POA: Diagnosis not present

## 2020-04-02 DIAGNOSIS — Z20822 Contact with and (suspected) exposure to covid-19: Secondary | ICD-10-CM | POA: Diagnosis not present

## 2020-04-02 DIAGNOSIS — I82403 Acute embolism and thrombosis of unspecified deep veins of lower extremity, bilateral: Secondary | ICD-10-CM | POA: Diagnosis present

## 2020-04-02 DIAGNOSIS — I824Z1 Acute embolism and thrombosis of unspecified deep veins of right distal lower extremity: Secondary | ICD-10-CM | POA: Insufficient documentation

## 2020-04-02 DIAGNOSIS — Z902 Acquired absence of lung [part of]: Secondary | ICD-10-CM | POA: Diagnosis not present

## 2020-04-02 DIAGNOSIS — J948 Other specified pleural conditions: Secondary | ICD-10-CM | POA: Diagnosis not present

## 2020-04-02 DIAGNOSIS — J869 Pyothorax without fistula: Secondary | ICD-10-CM

## 2020-04-02 DIAGNOSIS — I82402 Acute embolism and thrombosis of unspecified deep veins of left lower extremity: Secondary | ICD-10-CM | POA: Diagnosis not present

## 2020-04-02 DIAGNOSIS — I82409 Acute embolism and thrombosis of unspecified deep veins of unspecified lower extremity: Secondary | ICD-10-CM | POA: Diagnosis present

## 2020-04-02 DIAGNOSIS — Z03818 Encounter for observation for suspected exposure to other biological agents ruled out: Secondary | ICD-10-CM | POA: Diagnosis not present

## 2020-04-02 LAB — CBC
HCT: 27.9 % — ABNORMAL LOW (ref 36.0–46.0)
Hemoglobin: 8.9 g/dL — ABNORMAL LOW (ref 12.0–15.0)
MCH: 27.7 pg (ref 26.0–34.0)
MCHC: 31.9 g/dL (ref 30.0–36.0)
MCV: 86.9 fL (ref 80.0–100.0)
Platelets: 396 10*3/uL (ref 150–400)
RBC: 3.21 MIL/uL — ABNORMAL LOW (ref 3.87–5.11)
RDW: 17 % — ABNORMAL HIGH (ref 11.5–15.5)
WBC: 15.5 10*3/uL — ABNORMAL HIGH (ref 4.0–10.5)
nRBC: 0 % (ref 0.0–0.2)

## 2020-04-02 LAB — APTT: aPTT: 31 seconds (ref 24–36)

## 2020-04-02 LAB — PROTIME-INR
INR: 1.2 (ref 0.8–1.2)
Prothrombin Time: 14.3 seconds (ref 11.4–15.2)

## 2020-04-02 LAB — COMPREHENSIVE METABOLIC PANEL
ALT: 10 U/L (ref 0–44)
AST: 15 U/L (ref 15–41)
Albumin: 2.2 g/dL — ABNORMAL LOW (ref 3.5–5.0)
Alkaline Phosphatase: 135 U/L — ABNORMAL HIGH (ref 38–126)
Anion gap: 11 (ref 5–15)
BUN: 27 mg/dL — ABNORMAL HIGH (ref 8–23)
CO2: 24 mmol/L (ref 22–32)
Calcium: 8.3 mg/dL — ABNORMAL LOW (ref 8.9–10.3)
Chloride: 97 mmol/L — ABNORMAL LOW (ref 98–111)
Creatinine, Ser: 0.66 mg/dL (ref 0.44–1.00)
GFR calc Af Amer: >60 mL/min (ref >60)
GFR calc non Af Amer: >60 mL/min (ref >60)
Glucose, Bld: 104 mg/dL — ABNORMAL HIGH (ref 70–99)
Potassium: 4.6 mmol/L (ref 3.5–5.1)
Sodium: 132 mmol/L — ABNORMAL LOW (ref 135–145)
Total Bilirubin: 0.5 mg/dL (ref 0.3–1.2)
Total Protein: 6.5 g/dL (ref 6.5–8.1)

## 2020-04-02 LAB — SARS CORONAVIRUS 2 BY RT PCR (HOSPITAL ORDER, PERFORMED IN ~~LOC~~ HOSPITAL LAB): SARS Coronavirus 2: NEGATIVE

## 2020-04-02 MED ORDER — ACETAMINOPHEN 650 MG RE SUPP: 650.0000 mg | Freq: Four times a day (QID) | RECTAL | Status: DC | PRN

## 2020-04-02 MED ORDER — ONDANSETRON 4 MG PO TBDP: 4.0000 mg | ORAL_TABLET | Freq: Three times a day (TID) | ORAL | Status: DC | PRN

## 2020-04-02 MED ORDER — SODIUM CHLORIDE 0.9 % IV SOLN: INTRAVENOUS | Status: DC

## 2020-04-02 MED ORDER — WARFARIN SODIUM 2.5 MG PO TABS
2.5000 mg | ORAL_TABLET | Freq: Once | ORAL | Status: AC
  Administered 2020-04-02: 2.5 mg via ORAL
  Filled 2020-04-02: qty 1

## 2020-04-02 MED ORDER — HEPARIN BOLUS VIA INFUSION
2000.0000 [IU] | Freq: Once | INTRAVENOUS | Status: AC
  Administered 2020-04-02: 2000 [IU] via INTRAVENOUS
  Filled 2020-04-02: qty 2000

## 2020-04-02 MED ORDER — FLUTTER DEVI: 1.0000 | Freq: Every day | Status: DC

## 2020-04-02 MED ORDER — MOMETASONE FURO-FORMOTEROL FUM 200-5 MCG/ACT IN AERO
2.0000 | INHALATION_SPRAY | Freq: Two times a day (BID) | RESPIRATORY_TRACT | Status: DC
  Administered 2020-04-03: 2 via RESPIRATORY_TRACT
  Filled 2020-04-02: qty 8.8

## 2020-04-02 MED ORDER — TRAZODONE HCL 50 MG PO TABS
25.0000 mg | ORAL_TABLET | Freq: Every evening | ORAL | Status: DC | PRN
  Administered 2020-04-02: 25 mg via ORAL
  Filled 2020-04-02: qty 1

## 2020-04-02 MED ORDER — IPRATROPIUM-ALBUTEROL 0.5-2.5 (3) MG/3ML IN SOLN: 3.0000 mL | RESPIRATORY_TRACT | Status: DC | PRN

## 2020-04-02 MED ORDER — ALBUTEROL SULFATE (2.5 MG/3ML) 0.083% IN NEBU: 2.5000 mg | INHALATION_SOLUTION | Freq: Four times a day (QID) | RESPIRATORY_TRACT | Status: DC | PRN

## 2020-04-02 MED ORDER — ONDANSETRON HCL 4 MG/2ML IJ SOLN: 4.0000 mg | Freq: Four times a day (QID) | INTRAMUSCULAR | Status: DC | PRN

## 2020-04-02 MED ORDER — HEPARIN (PORCINE) 25000 UT/250ML-% IV SOLN
700.0000 [IU]/h | INTRAVENOUS | Status: DC
  Administered 2020-04-02: 550 [IU]/h via INTRAVENOUS
  Filled 2020-04-02: qty 250

## 2020-04-02 MED ORDER — RIFABUTIN 150 MG PO CAPS: 300.0000 mg | ORAL_CAPSULE | Freq: Every day | ORAL | Status: DC

## 2020-04-02 MED ORDER — BENZONATATE 100 MG PO CAPS
100.0000 mg | ORAL_CAPSULE | Freq: Three times a day (TID) | ORAL | Status: DC | PRN
  Administered 2020-04-03: 100 mg via ORAL
  Filled 2020-04-02: qty 1

## 2020-04-02 MED ORDER — MAGNESIUM HYDROXIDE 400 MG/5ML PO SUSP: 30.0000 mL | Freq: Every day | ORAL | Status: DC | PRN

## 2020-04-02 MED ORDER — ONDANSETRON HCL 4 MG PO TABS: 4.0000 mg | ORAL_TABLET | Freq: Three times a day (TID) | ORAL | Status: DC | PRN

## 2020-04-02 MED ORDER — ACETAMINOPHEN 325 MG PO TABS
650.0000 mg | ORAL_TABLET | Freq: Four times a day (QID) | ORAL | Status: DC | PRN
  Administered 2020-04-02: 650 mg via ORAL
  Filled 2020-04-02: qty 2

## 2020-04-02 MED ORDER — HYDROCODONE-ACETAMINOPHEN 5-325 MG PO TABS: 1.0000 | ORAL_TABLET | ORAL | Status: DC | PRN

## 2020-04-02 MED ORDER — VITAMIN B-12 1000 MCG PO TABS
1000.0000 ug | ORAL_TABLET | Freq: Every day | ORAL | Status: DC
  Administered 2020-04-03: 1000 ug via ORAL
  Filled 2020-04-02: qty 1

## 2020-04-02 MED ORDER — ONDANSETRON HCL 4 MG PO TABS: 4.0000 mg | ORAL_TABLET | Freq: Four times a day (QID) | ORAL | Status: DC | PRN

## 2020-04-02 MED ORDER — TIOTROPIUM BROMIDE MONOHYDRATE 18 MCG IN CAPS
18.0000 ug | ORAL_CAPSULE | Freq: Every day | RESPIRATORY_TRACT | Status: DC
  Administered 2020-04-03: 18 ug via RESPIRATORY_TRACT
  Filled 2020-04-02: qty 5

## 2020-04-02 MED ORDER — VITAMIN D 25 MCG (1000 UNIT) PO TABS
2000.0000 [IU] | ORAL_TABLET | Freq: Every day | ORAL | Status: DC
  Administered 2020-04-03: 2000 [IU] via ORAL
  Filled 2020-04-02: qty 2

## 2020-04-02 MED ORDER — WARFARIN - PHARMACIST DOSING INPATIENT: Freq: Every day | Status: DC

## 2020-04-02 NOTE — Progress Notes (Addendum)
ANTICOAGULATION CONSULT NOTE - Initial Consult  Pharmacy Consult for Heparin Drip/warfarin dosing Indication: DVT  Allergies  Allergen Reactions  . Keppra [Levetiracetam] Other (See Comments)    Nausea and dizzy  . Lyrica [Pregabalin] Other (See Comments)    Made patient feel very dizzy and not feel good.     Patient Measurements: Height: _0  (154.9 cm) Weight: 36.3 kg (80 lb) IBW/kg (Calculated) : 47.8 Heparin Dosing Weight:  36.3 kg  Vital Signs: Temp: 97.7 F (36.5 C) (07/02 2256) Temp Source: Oral (07/02 2256) BP: 100/60 (07/02 2256) Pulse Rate: 88 (07/02 2256)  Labs: Recent Labs    04/02/20 1546  HGB 8.9*  HCT 27.9*  PLT 396  APTT 31  LABPROT 14.3  INR 1.2  CREATININE 0.66    Estimated Creatinine Clearance: 40.2 mL/min (by C-G formula based on SCr of 0.66 mg/dL).   Medical History: Past Medical History:  Diagnosis Date  . Cancer of lower lobe of right lung ([place]) 05/07/2015  . COPD (chronic obstructive pulmonary disease) ([place])   . Dyspnea   . Non-small cell lung cancer ([place])   . Pneumonia 04/2015  . Pulmonary Mycobacterium avium complex (MAC) infection ([place]) 10/25/2018    Assessment: Patient is a 64yo female being admitted for DVT. Pharmacy consulted for Heparin dosing. No prior anticoagulants noted. Baseline labs within range.  Goal of Therapy:  Heparin level 0.3-0.7 units/ml Monitor platelets by anticoagulation protocol: Yes   Plan:  07/02 @ 2200 HL ordered for 07/03 at 0500, will give patient warfarin 2.5 mg PO x 1 and monitor and adjust dose per INR trends.   Tobie Lords, PharmD, BCPS Clinical Pharmacist 04/02/2020 11:03 PM

## 2020-04-02 NOTE — ED Triage Notes (Signed)
Pt here to be seen for DVT to both legs. Denies current blood thinner use. L leg swollen. US performed today and Dr. Genevive Bi told pt to come to ER. A&O, in wheelchair.   Pt has drainage tube in R lung that was placed Monday.

## 2020-04-02 NOTE — ED Notes (Signed)
Pt c/o of rib pain due to coughing, reports a productive cough. Pt has drainage chest tube to gravity bag on right side. Small amount of white drainage noted in tube.

## 2020-04-02 NOTE — Patient Instructions (Addendum)
Dr Genevive Bi advised patient to remove film before showering, pat dry and then apply the dressing after shower.   Patient will need to arrive to [facility] at 12:45pm.   [address], [place], [place] [address] - [phone]   Wound Care, Adult Taking care of your wound properly can help to prevent pain, infection, and scarring. It can also help your wound to heal more quickly. How to care for your wound Wound care      Follow instructions from your health care provider about how to take care of your wound. Make sure you: ? Wash your hands with soap and water before you change the bandage (dressing). If soap and water are not available, use hand sanitizer. ? Change your dressing as told by your health care provider. ? Leave stitches (sutures), skin glue, or adhesive strips in place. These skin closures may need to stay in place for 2 weeks or longer. If adhesive strip edges start to loosen and curl up, you may trim the loose edges. Do not remove adhesive strips completely unless your health care provider tells you to do that.  Check your wound area every day for signs of infection. Check for: ? Redness, swelling, or pain. ? Fluid or blood. ? Warmth. ? Pus or a bad smell.  Ask your health care provider if you should clean the wound with mild soap and water. Doing this may include: ? Using a clean towel to pat the wound dry after cleaning it. Do not rub or scrub the wound. ? Applying a cream or ointment. Do this only as told by your health care provider. ? Covering the incision with a clean dressing.  Ask your health care provider when you can leave the wound uncovered.  Keep the dressing dry until your health care provider says it can be removed. Do not take baths, swim, use a hot tub, or do anything that would put the wound underwater until your health care provider approves. Ask your health care provider if you can take showers. You may only be allowed to take sponge  baths. Medicines   If you were prescribed an antibiotic medicine, cream, or ointment, take or use the antibiotic as told by your health care provider. Do not stop taking or using the antibiotic even if your condition improves.  Take over-the-counter and prescription medicines only as told by your health care provider. If you were prescribed pain medicine, take it 30 or more minutes before you do any wound care or as told by your health care provider. General instructions  Return to your normal activities as told by your health care provider. Ask your health care provider what activities are safe.  Do not scratch or pick at the wound.  Do not use any products that contain nicotine or tobacco, such as cigarettes and e-cigarettes. These may delay wound healing. If you need help quitting, ask your health care provider.  Keep all follow-up visits as told by your health care provider. This is important.  Eat a diet that includes protein, vitamin A, vitamin C, and other nutrient-rich foods to help the wound heal. ? Foods rich in protein include meat, dairy, beans, nuts, and other sources. ? Foods rich in vitamin A include carrots and dark green, leafy vegetables. ? Foods rich in vitamin C include citrus, tomatoes, and other fruits and vegetables. ? Nutrient-rich foods have protein, carbohydrates, fat, vitamins, or minerals. Eat a variety of healthy foods including vegetables, fruits, and whole grains. Contact  a health care provider if:  You received a tetanus shot and you have swelling, severe pain, redness, or bleeding at the injection site.  Your pain is not controlled with medicine.  You have redness, swelling, or pain around the wound.  You have fluid or blood coming from the wound.  Your wound feels warm to the touch.  You have pus or a bad smell coming from the wound.  You have a fever or chills.  You are nauseous or you vomit.  You are dizzy. Get help right away if:  You  have a red streak going away from your wound.  The edges of the wound open up and separate.  Your wound is bleeding, and the bleeding does not stop with gentle pressure.  You have a rash.  You faint.  You have trouble breathing. Summary  Always wash your hands with soap and water before changing your bandage (dressing).  To help with healing, eat foods that are rich in protein, vitamin A, vitamin C, and other nutrients.  Check your wound every day for signs of infection. Contact your health care provider if you suspect that your wound is infected. This information is not intended to replace advice given to you by your health care provider. Make sure you discuss any questions you have with your health care provider. Document Revised: 01/06/2019 Document Reviewed: 04/04/2016 Elsevier Patient Education  [place].

## 2020-04-02 NOTE — H&P (Addendum)
[facility] NAME: Claudia Chavez    MR#:  [id number]  DATE OF BIRTH:  [birth year]  DATE OF ADMISSION:  04/02/2020  PRIMARY CARE PHYSICIAN: Maryland Pink, MD   REQUESTING/REFERRING PHYSICIAN: Arta Silence, MD  CHIEF COMPLAINT:  Bilateral leg swelling and pain.  HISTORY OF PRESENT ILLNESS:  Claudia Chavez  is a 64 y.o. Caucasian female with a known history of right lower lobe non-small cell lung cancer status post right lower lobectomy 5 years ago, and right pulmonary MAC infection status post completion of treatment with rifabutin as well as COPD who had a large abscess in the right upper leg that was drained and a chest tube was placed.  She started developing bilateral lower extremity swelling without associated pain.  She was seen by Dr. Faith Rogue her CT surgeon today and was sent to have bilateral lower extremity venous Doppler that showed bilateral lower extremity DVT there is much worse on the left than the right.  She had right popliteal and calf veins thrombi and left common femoral, saphenofemoral, profundofemoral, femoral vein, popliteal vein and calf veins occlusive thrombosis.  No chest pain or dyspnea or worsening cough or wheezing or hemoptysis.  She denies any worsening cough or wheezing or dyspnea.  No hemoptysis.  No fever or chills.  No nausea or vomiting or abdominal pain.  She denies any chest pain or palpitations.  Upon presentation to the emergency room, temperature was 94.2 and later 98 heart rate 112 and later 78 with pulse oximetry of 96 to 100% on room air.  Labs revealed leukocytosis of 15.5 with platelets of 396 and anemia at baseline, hyponatremia and hypochloremia that are mild and low albumin of 2.2.  INR was 1.2 and PT 14.3 with PTT of 31.  Two-view chest x-ray showed right-sided chest tube to be stable in appearance and right hydropneumothorax again noted and stable with multiple nodular densities in the left lung.  The  patient was started on IV heparin.  Given the intensity of her left lower extremity DVT in the setting of right-sided chest tube, she will be admitted to a medically monitored bed for further evaluation and management. PAST MEDICAL HISTORY:   Past Medical History:  Diagnosis Date  . Cancer of lower lobe of right lung ([place]) 05/07/2015  . COPD (chronic obstructive pulmonary disease) ([place])   . Dyspnea   . Non-small cell lung cancer ([place])   . Pneumonia 04/2015  . Pulmonary Mycobacterium avium complex (MAC) infection ([place]) 10/25/2018    PAST SURGICAL HISTORY:   Past Surgical History:  Procedure Laterality Date  . ABDOMINAL HYSTERECTOMY    . COLONOSCOPY WITH PROPOFOL N/A 06/09/2018   Procedure: COLONOSCOPY WITH PROPOFOL;  Surgeon: Toledo, Benay Pike, MD;  Location: [facility] END[facility]COPY;  Service: Gastroenterology;  Laterality: N/A;  . ELBOW SURGERY Right 1995  . ELECTROMAGNETIC NAVIGATION BROCHOSCOPY N/A 10/25/2017   Procedure: ELECTROMAGNETIC NAVIGATION BRONCHOSCOPY;  Surgeon: [name], MD;  Location: [facility] ORS;  Service: Cardiopulmonary;  Laterality: N/A;  . ESOPHAGOGASTRODUODENOSCOPY N/A 06/08/2018   Procedure: ESOPHAGOGASTRODUODENOSCOPY (EGD);  Surgeon: Toledo, Benay Pike, MD;  Location: [facility] ENDOSCOPY;  Service: Gastroenterology;  Laterality: N/A;  . FLEXIBLE BRONCHOSCOPY Right 10/10/2018   Procedure: FLEXIBLE BRONCHOSCOPY;  Surgeon: [name], MD;  Location: [facility] ORS;  Service: Cardiopulmonary;  Laterality: Right;  . PORTACATH PLACEMENT Right 05/10/2015   Procedure: INSERTION PORT-A-CATH;  Surgeon: Nestor Lewandowsky, MD;  Location: [facility] ORS;  Service: General;  Laterality: Right;  Marland Kitchen VIDEO  ASSISTED THORACOSCOPY (VATS)/THOROCOTOMY Right 08/18/2015   Procedure: VIDEO ASSISTED THORACOSCOPY (VATS)/THOROCOTOMY;  Surgeon: Nestor Lewandowsky, MD;  Location: [facility] ORS;  Service: General;  Laterality: Right;    SOCIAL HISTORY:   Social History   Tobacco Use  . Smoking status: Former Smoker    Packs/day: 0.50     Years: 40.00    Pack years: 20.00    Types: Cigarettes    Quit date: 12/01/2019    Years since quitting: 0.3  . Smokeless tobacco: Never Used  Substance Use Topics  . Alcohol use: Not Currently    Alcohol/week: 2.0 - 4.0 standard drinks    Types: 2 - 4 Cans of beer per week    Comment: beer-occasionally    FAMILY HISTORY:   Family History  Problem Relation Age of Onset  . Stroke Mother   . Lung cancer Father 29    DRUG ALLERGIES:   Allergies  Allergen Reactions  . Keppra [Levetiracetam] Other (See Comments)    Nausea and dizzy  . Lyrica [Pregabalin] Other (See Comments)    Made patient feel very dizzy and not feel good.     REVIEW OF SYSTEMS:   ROS As per history of present illness. All pertinent systems were reviewed above. Constitutional,  HEENT, cardiovascular, respiratory, GI, GU, musculoskeletal, neuro, psychiatric, endocrine,  integumentary and hematologic systems were reviewed and are otherwise  negative/unremarkable except for positive findings mentioned above in the HPI.   MEDICATIONS AT HOME:   Prior to Admission medications   Medication Sig Start Date End Date Taking? Authorizing Provider  albuterol (PROVENTIL HFA;VENTOLIN HFA) 108 (90 Base) MCG/ACT inhaler Inhale 2 puffs into the lungs every 6 (six) hours as needed for wheezing or shortness of breath.     [provider]  benzonatate (TESSALON) 100 MG capsule TAKE 1 CAPSULE BY MOUTH THREE TIMES A DAY AS NEEDED FOR COUGH 03/08/20   Cammie Sickle, MD  Cholecalciferol (VITAMIN D3) 1000 units CAPS Take 2,000 Units by mouth daily.     [provider]  Fluticasone-Salmeterol (ADVAIR DISKUS) 250-50 MCG/DOSE AEPB Inhale 1 puff into the lungs every 12 (twelve) hours.  09/05/17   [provider]  HYDROcodone-acetaminophen (NORCO/VICODIN) 5-325 MG tablet Take 1 tablet by mouth every 4 (four) hours as needed for moderate pain or severe pain. 03/30/20   Nestor Lewandowsky, MD    ipratropium-albuterol (DUONEB) 0.5-2.5 (3) MG/3ML SOLN Take 3 mLs by nebulization every 4 (four) hours as needed. 09/09/19   Cammie Sickle, MD  megestrol (MEGACE ES) 625 MG/5ML suspension Take 5 mLs (625 mg total) by mouth daily. 03/10/20   Cammie Sickle, MD  ondansetron (ZOFRAN) 4 MG tablet Take 1 tablet (4 mg total) by mouth every 8 (eight) hours as needed for nausea or vomiting. 03/08/20   Parrett, Fonnie Mu, NP  Respiratory Therapy Supplies (FLUTTER) DEVI 1 each by Does not apply route daily. 09/12/18   [name], MD  rifabutin (MYCOBUTIN) 150 MG capsule Take 2 capsules (300 mg total) by mouth daily. 01/01/20   Tsosie Billing, MD  SPIRIVA RESPIMAT 2.5 MCG/ACT AERS Inhale 2 puffs into the lungs daily.  10/17/19   [provider]  vitamin B-12 (CYANOCOBALAMIN) 1000 MCG tablet Take 1,000 mcg by mouth daily.    [provider]      VITAL SIGNS:  Blood pressure 111/79, pulse 87, temperature 98 F (36.7 C), temperature source Oral, resp. rate 16, height _0  (1.549 m), weight 36.3 kg, SpO2 100 %.  PHYSICAL  EXAMINATION:  Physical Exam  GENERAL:  64 y.o.-year-old Caucasian female patient lying in the bed with mild conversational dyspnea.   EYES: Pupils equal, round, reactive to light and accommodation. No scleral icterus. Extraocular muscles intact.  HEENT: Head atraumatic, normocephalic. Oropharynx and nasopharynx clear.  NECK:  Supple, no jugular venous distention. No thyroid enlargement, no tenderness.  LUNGS: Mildly diminished right basal breath sounds.  Intact right chest tube. CARDIOVASCULAR: Regular rate and rhythm, S1, S2 normal. No murmurs, rubs, or gallops.  ABDOMEN: Soft, nondistended, nontender. Bowel sounds present. No organomegaly or mass.  EXTREMITIES: Bilateral lower extremity swelling more remarkable on the left with positive Homans' sign with no cyanosis, or clubbing.  NEUROLOGIC: Cranial nerves II through XII are intact. Muscle strength  5/5 in all extremities. Sensation intact. Gait not checked.  PSYCHIATRIC: The patient is alert and oriented x 3.  Normal affect and good eye contact. SKIN: No obvious rash, lesion, or ulcer.   LABORATORY PANEL:   CBC Recent Labs  Lab 04/02/20 1546  WBC 15.5*  HGB 8.9*  HCT 27.9*  PLT 396   ------------------------------------------------------------------------------------------------------------------  Chemistries  Recent Labs  Lab 04/02/20 1546  NA 132*  K 4.6  CL 97*  CO2 24  GLUCOSE 104*  BUN 27*  CREATININE 0.66  CALCIUM 8.3*  AST 15  ALT 10  ALKPHOS 135*  BILITOT 0.5   ------------------------------------------------------------------------------------------------------------------  Cardiac Enzymes No results for input(s): TROPONINI in the last 168 hours. ------------------------------------------------------------------------------------------------------------------  RADIOLOGY:  DG Chest 2 View  Result Date: 04/02/2020 CLINICAL DATA:  Check chest tube placement EXAM: CHEST - 2 VIEW COMPARISON:  03/30/2020 FINDINGS: Pigtail catheter is again noted on the right stable in appearance from the prior exam. Air-fluid level is noted adjacent to the catheter similar to that noted on prior examination. The air-fluid collection extends superiorly along the lung apex similar to that noted on prior CT examinations. The overall appearance is stable. Left lung remains clear with the exception of scattered nodule similar to that noted on prior CT. Cardiac shadow is stable. Right chest wall port is again noted. IMPRESSION: Chest tube is stable in appearance. The right hydropneumothorax is again noted and stable. Multiple nodular densities in the left lung. Electronically Signed   By: Inez Catalina M.D.   On: 04/02/2020 14:30   US Venous Img Lower Bilateral (DVT)  Result Date: 04/02/2020 CLINICAL DATA:  Bilateral foot swelling for 1 week, history of prior DVTs EXAM: BILATERAL LOWER  EXTREMITY VENOUS DOPPLER ULTRASOUND TECHNIQUE: Gray-scale sonography with graded compression, as well as color Doppler and duplex ultrasound were performed to evaluate the lower extremity deep venous systems from the level of the common femoral vein and including the common femoral, femoral, profunda femoral, popliteal and calf veins including the posterior tibial, peroneal and gastrocnemius veins when visible. The superficial great saphenous vein was also interrogated. Spectral Doppler was utilized to evaluate flow at rest and with distal augmentation maneuvers in the common femoral, femoral and popliteal veins. COMPARISON:  None. FINDINGS: RIGHT LOWER EXTREMITY Common Femoral Vein: No evidence of thrombus. Normal compressibility, respiratory phasicity and response to augmentation. Saphenofemoral Junction: No evidence of thrombus. Normal compressibility and flow on color Doppler imaging. Profunda Femoral Vein: No evidence of thrombus. Normal compressibility and flow on color Doppler imaging. Femoral Vein: No evidence of thrombus. Normal compressibility, respiratory phasicity and response to augmentation. Popliteal Vein: Occlusive thrombus is noted. Calf Veins: Occlusive thrombus is noted in the posterior tibial vein. The peroneal veins are not well  visualized. Superficial Great Saphenous Vein: No evidence of thrombus. Normal compressibility. Venous Reflux:  None. Other Findings:  None. LEFT LOWER EXTREMITY Common Femoral Vein: Occlusive thrombus is noted. Saphenofemoral Junction: Occlusive thrombus is noted. Profunda Femoral Vein: Occlusive thrombus is noted. Femoral Vein: Occlusive thrombus is noted. Popliteal Vein: Occlusive thrombus is noted. Calf Veins: Occlusive thrombus is noted in the posterior tibial veins. The peroneal veins are not well appreciated. Superficial Great Saphenous Vein: No evidence of thrombus. Normal compressibility. Venous Reflux:  None. Other Findings:  None. IMPRESSION: Bilateral deep  venous thrombosis much worse on the left than the right as described above. Electronically Signed   By: Inez Catalina M.D.   On: 04/02/2020 14:24      IMPRESSION AND PLAN:   1.  Bilateral lower extremity DVT with extensive DVT on the left. -The patient will be admitted to a medical monitored bed. -We will continue her on IV heparin and start her on p.o. Coumadin for now. -PT/INR will be checked. -We will monitor for bleeding given her right-sided chest tube. -We will stop her Megace due to its coagulopathic side effect.  2.  COPD without exacerbation. -We will continue her nebulizer therapy, inhalers and Spiriva.  3.  Right pulmonary MAC infection status post failure and abscess, status post cutaneous drainage with right chest pigtail catheter.  The patient has a history of right lower lobe small cell lung cancer status post lobectomy. -She has recently finished a course of rifabutin. -Heme-onc consultation will be obtained. -I notified Dr. Grayland Ormond about the patient.  4.  DVT prophylaxis. -The patient will be on IV heparin and p.o. Coumadin.    All the records are reviewed and case discussed with ED provider. The plan of care was discussed in details with the patient (and family). I answered all questions. The patient agreed to proceed with the above mentioned plan. Further management will depend upon hospital course.   CODE STATUS: Full code  Status is: Inpatient  Remains inpatient appropriate because:Ongoing active pain requiring inpatient pain management, Ongoing diagnostic testing needed not appropriate for outpatient work up, Unsafe d/c plan, IV treatments appropriate due to intensity of illness or inability to take PO and Inpatient level of care appropriate due to severity of illness  The patient's left lower extremity DVT is extensive requiring close monitoring with anticoagulation to avoid development of PE on one hand and monitoring for bleeding on the other hand in the  setting of right-sided chest tube.  This in my opinion will require more than 2 nights of hospitalization.   Dispo: The patient is from: Home              Anticipated d/c is to: Home              Anticipated d/c date is: 2 days              Patient currently is not medically stable to d/c.    TOTAL TIME TAKING CARE OF THIS PATIENT: 55 minutes.    Christel Mormon M.D on 04/02/2020 at 9:43 PM  [facility]   From 7 PM-7 AM, contact night-coverage [email or web address]  CC: Primary care physician; Maryland Pink, MD   Note: This dictation was prepared with Dragon dictation along with smaller phrase technology. Any transcriptional typo errors that result from this process are unintentional.

## 2020-04-02 NOTE — ED Provider Notes (Signed)
[facility] Emergency Department Provider Note ____________________________________________   First MD Initiated Contact with Patient 04/02/20 1944     (approximate)  I have reviewed the triage vital signs and the nursing notes.   HISTORY  Chief Complaint No chief complaint on file.    HPI Claudia Chavez is a 64 y.o. female with PMH as noted below including recent admission for chest tube placement for a lung abscess who presents with bilateral lower extremity swelling diagnosed as DVT.  The patient states that she was at a routine postoperative visit with cardiothoracic surgery today when she mentioned that her legs have been swollen over the last few days.  The surgeon sent her for ultrasound which was positive for DVT bilaterally.  The patient denies any chest pain or shortness of breath other than what she has been experiencing since the chest tube last week.  Past Medical History:  Diagnosis Date  . Cancer of lower lobe of right lung ([place]) 05/07/2015  . COPD (chronic obstructive pulmonary disease) ([place])   . Dyspnea   . Non-small cell lung cancer ([place])   . Pneumonia 04/2015  . Pulmonary Mycobacterium avium complex (MAC) infection ([place]) 10/25/2018    Patient Active Problem List   Diagnosis Date Noted  . DVT of lower extremity, bilateral ([place]) 04/02/2020  . Protein-calorie malnutrition, severe 03/30/2020  . Empyema ([place]) 03/26/2020  . Pulmonary Mycobacterium avium complex (MAC) infection ([place]) 10/25/2018  . Lung mass   . Brain lesion 08/20/2018  . History of stroke 08/20/2018  . Malignant neoplasm of right upper lobe of lung ([place]) 06/21/2018  . Hypokalemia 06/21/2018  . Orthostasis 06/11/2018  . Chronic deep vein thrombosis (DVT) of distal vein of lower extremity ([place]) 06/11/2018  . Symptomatic anemia 06/07/2018  . Gastrointestinal hemorrhage 06/07/2018  . CVA (cerebral vascular accident) ([place]) 05/24/2018  . Syncope 05/24/2018  . Lung nodule     . Chronic bronchitis ([place]) 10/21/2015  . Malignant neoplasm of lung ([place]) 05/07/2015  . Bronchitis, chronic ([place]) 04/28/2015  . Osteoporosis 04/28/2015  . Overactive bladder 04/28/2015  . Situational depression 04/28/2015  . Carpal tunnel syndrome 07/13/2014  . Cervical radiculitis 07/13/2014  . Cervical spinal stenosis 07/13/2014  . Carpal tunnel syndrome of left wrist 07/13/2014    Past Surgical History:  Procedure Laterality Date  . ABDOMINAL HYSTERECTOMY    . COLONOSCOPY WITH PROPOFOL N/A 06/09/2018   Procedure: COLONOSCOPY WITH PROPOFOL;  Surgeon: Toledo, Benay Pike, MD;  Location: [facility] ENDOSCOPY;  Service: Gastroenterology;  Laterality: N/A;  . ELBOW SURGERY Right 1995  . ELECTROMAGNETIC NAVIGATION BROCHOSCOPY N/A 10/25/2017   Procedure: ELECTROMAGNETIC NAVIGATION BRONCHOSCOPY;  Surgeon: [name], MD;  Location: [facility] ORS;  Service: Cardiopulmonary;  Laterality: N/A;  . ESOPHAGOGASTRODUODENOSCOPY N/A 06/08/2018   Procedure: ESOPHAGOGASTRODUODENOSCOPY (EGD);  Surgeon: Toledo, Benay Pike, MD;  Location: [facility] ENDOSCOPY;  Service: Gastroenterology;  Laterality: N/A;  . FLEXIBLE BRONCHOSCOPY Right 10/10/2018   Procedure: FLEXIBLE BRONCHOSCOPY;  Surgeon: [name], MD;  Location: [facility] ORS;  Service: Cardiopulmonary;  Laterality: Right;  . PORTACATH PLACEMENT Right 05/10/2015   Procedure: INSERTION PORT-A-CATH;  Surgeon: Nestor Lewandowsky, MD;  Location: [facility] ORS;  Service: General;  Laterality: Right;  Marland Kitchen VIDEO ASSISTED THORACOSCOPY (VATS)/THOROCOTOMY Right 08/18/2015   Procedure: VIDEO ASSISTED THORACOSCOPY (VATS)/THOROCOTOMY;  Surgeon: Nestor Lewandowsky, MD;  Location: [facility] ORS;  Service: General;  Laterality: Right;    Prior to Admission medications   Medication Sig Start Date End Date Taking? Authorizing Provider  albuterol (PROVENTIL HFA;VENTOLIN HFA) 108 (90  Base) MCG/ACT inhaler Inhale 2 puffs into the lungs every 6 (six) hours as needed for wheezing or shortness of breath.    Yes [provider]  benzonatate (TESSALON) 100 MG capsule TAKE 1 CAPSULE BY MOUTH THREE TIMES A DAY AS NEEDED FOR COUGH 03/08/20  Yes Cammie Sickle, MD  Cholecalciferol (VITAMIN D3) 1000 units CAPS Take 2,000 Units by mouth daily.    Yes [provider]  Fluticasone-Salmeterol (ADVAIR DISKUS) 250-50 MCG/DOSE AEPB Inhale 1 puff into the lungs every 12 (twelve) hours.  09/05/17  Yes [provider]  HYDROcodone-acetaminophen (NORCO/VICODIN) 5-325 MG tablet Take 1 tablet by mouth every 4 (four) hours as needed for moderate pain or severe pain. 03/30/20  Yes Nestor Lewandowsky, MD  ipratropium-albuterol (DUONEB) 0.5-2.5 (3) MG/3ML SOLN Take 3 mLs by nebulization every 4 (four) hours as needed. 09/09/19  Yes Cammie Sickle, MD  megestrol (MEGACE ES) 625 MG/5ML suspension Take 5 mLs (625 mg total) by mouth daily. 03/10/20  Yes Cammie Sickle, MD  ondansetron (ZOFRAN) 4 MG tablet Take 1 tablet (4 mg total) by mouth every 8 (eight) hours as needed for nausea or vomiting. 03/08/20  Yes Parrett, Fonnie Mu, NP  Respiratory Therapy Supplies (FLUTTER) DEVI 1 each by Does not apply route daily. 09/12/18  Yes Kasa, Maretta Bees, MD  rifabutin (MYCOBUTIN) 150 MG capsule Take 2 capsules (300 mg total) by mouth daily. 01/01/20  Yes Tsosie Billing, MD  SPIRIVA RESPIMAT 2.5 MCG/ACT AERS Inhale 2 puffs into the lungs daily.  10/17/19  Yes [provider]  vitamin B-12 (CYANOCOBALAMIN) 1000 MCG tablet Take 1,000 mcg by mouth daily.   Yes [provider]    Allergies Keppra [levetiracetam] and Lyrica [pregabalin]  Family History  Problem Relation Age of Onset  . Stroke Mother   . Lung cancer Father 75    Social History Social History   Tobacco Use  . Smoking status: Former Smoker    Packs/day: 0.50    Years: 40.00    Pack years: 20.00    Types: Cigarettes    Quit date: 12/01/2019    Years since quitting: 0.3  . Smokeless tobacco: Never Used  Vaping Use  . Vaping Use:  Never used  Substance Use Topics  . Alcohol use: Not Currently    Alcohol/week: 2.0 - 4.0 standard drinks    Types: 2 - 4 Cans of beer per week    Comment: beer-occasionally  . Drug use: No    Review of Systems  Constitutional: No fever/chills. Eyes: No visual changes. ENT: No sore throat. Cardiovascular: Denies acute chest pain. Respiratory: Denies acute shortness of breath. Gastrointestinal: No vomiting or diarrhea.  Genitourinary: Negative for dysuria.  Musculoskeletal: Negative for back pain.  Positive for leg swelling Skin: Negative for rash. Neurological: Negative for headaches, focal weakness or numbness.   ____________________________________________   PHYSICAL EXAM:  VITAL SIGNS: ED Triage Vitals [04/02/20 1540]  Enc Vitals Group     BP 99/73     Pulse Rate 78     Resp 18     Temp 98 F (36.7 C)     Temp Source Oral     SpO2 100 %     Weight 80 lb (36.3 kg)     Height _0  (1.549 m)     Head Circumference      Peak Flow      Pain Score 0     Pain Loc      Pain Edu?  Excl. in [place]?     Constitutional: Alert and oriented. Well appearing and in no acute distress. Eyes: Conjunctivae are normal.  Head: Atraumatic. Nose: No congestion/rhinnorhea. Mouth/Throat: Mucous membranes are moist.   Neck: Normal range of motion.  Cardiovascular: Normal rate, regular rhythm.   Good peripheral circulation. Respiratory: Normal respiratory effort.  No retractions.  Gastrointestinal: No distention.  Musculoskeletal: Trace edema to the right lower extremity, and 1+ nonpitting edema to left lower extremity.  No significant tenderness.  Extremities warm and well perfused.  Neurologic:  Normal speech and language. No gross focal neurologic deficits are appreciated.  Skin:  Skin is warm and dry. No rash noted. Psychiatric: Mood and affect are normal. Speech and behavior are normal.  ____________________________________________   LABS (all labs ordered are listed,  but only abnormal results are displayed)  Labs Reviewed  CBC - Abnormal; Notable for the following components:      Result Value   WBC 15.5 (*)    RBC 3.21 (*)    Hemoglobin 8.9 (*)    HCT 27.9 (*)    RDW 17.0 (*)    All other components within normal limits  COMPREHENSIVE METABOLIC PANEL - Abnormal; Notable for the following components:   Sodium 132 (*)    Chloride 97 (*)    Glucose, Bld 104 (*)    BUN 27 (*)    Calcium 8.3 (*)    Albumin 2.2 (*)    Alkaline Phosphatase 135 (*)    All other components within normal limits  SARS CORONAVIRUS 2 BY RT PCR (HOSPITAL ORDER, [place] [facility])  PROTIME-INR  APTT  HIV ANTIBODY (ROUTINE TESTING W REFLEX)  BASIC METABOLIC PANEL  CBC  HEPARIN LEVEL (UNFRACTIONATED)  PROTIME-INR   ____________________________________________  EKG   ____________________________________________  RADIOLOGY    ____________________________________________   PROCEDURES  Procedure(s) performed: No  Procedures  Critical Care performed: No ____________________________________________   INITIAL IMPRESSION / ASSESSMENT AND PLAN / ED COURSE  Pertinent labs & imaging results that were available during my care of the patient were reviewed by me and considered in my medical decision making (see chart for details).  64 year old female with PMH as noted above and status post recent admission and chest tube placement presents for a diagnosis of bilateral DVT made on ultrasound earlier today.  I reviewed the past medical records in [place].  The patient was admitted for percutaneous drainage of an abscess cavity to her right lung or pleural space.  She had no complications and was discharged on 6/29.  Ultrasound earlier today shows bilateral DVT, worse on the left, with thrombus from the femoral to popliteal and calf veins.  On exam, the patient is overall well-appearing.  Her vital signs are normal.  She has some swelling  especially to the left leg but no significant tenderness.  Given the extensive DVT on the left as well as the recent chest tube, I recommended that we admit the patient for heparin infusion that could be stopped if she develops any bleeding or complications.  She can then be transitioned to an oral anticoagulant.  The patient agrees with this plan.  I discussed her case with Dr. Sidney Ace from the hospitalist service.  ____________________________________________   FINAL CLINICAL IMPRESSION(S) / ED DIAGNOSES  Final diagnoses:  None      NEW MEDICATIONS STARTED DURING THIS VISIT:  Current Discharge Medication List       Note:  This document was prepared using Dragon voice recognition software  and may include unintentional dictation errors.   Arta Silence, MD 04/03/20 [phone]

## 2020-04-02 NOTE — ED Notes (Addendum)
Transport requested, ED RN to transport.

## 2020-04-02 NOTE — Progress Notes (Signed)
ANTICOAGULATION CONSULT NOTE - Initial Consult  Pharmacy Consult for Heparin Drip Indication: DVT  Allergies  Allergen Reactions  . Keppra [Levetiracetam] Other (See Comments)    Nausea and dizzy  . Lyrica [Pregabalin] Other (See Comments)    Made patient feel very dizzy and not feel good.     Patient Measurements: Height: _0  (154.9 cm) Weight: 36.3 kg (80 lb) IBW/kg (Calculated) : 47.8 Heparin Dosing Weight:  36.3 kg  Vital Signs: Temp: 98 F (36.7 C) (07/02 1540) Temp Source: Oral (07/02 1540) BP: 111/79 (07/02 2029) Pulse Rate: 87 (07/02 2029)  Labs: Recent Labs    04/02/20 1546  HGB 8.9*  HCT 27.9*  PLT 396  APTT 31  LABPROT 14.3  INR 1.2  CREATININE 0.66    Estimated Creatinine Clearance: 40.2 mL/min (by C-G formula based on SCr of 0.66 mg/dL).   Medical History: Past Medical History:  Diagnosis Date  . Cancer of lower lobe of right lung ([place]) 05/07/2015  . COPD (chronic obstructive pulmonary disease) ([place])   . Dyspnea   . Non-small cell lung cancer ([place])   . Pneumonia 04/2015  . Pulmonary Mycobacterium avium complex (MAC) infection ([place]) 10/25/2018    Assessment: Patient is a 64yo female being admitted for DVT. Pharmacy consulted for Heparin dosing. No prior anticoagulants noted. Baseline labs within range.  Goal of Therapy:  Heparin level 0.3-0.7 units/ml Monitor platelets by anticoagulation protocol: Yes   Plan:  Give 2000 units bolus x 1 Start heparin infusion at 550 units/hr Check anti-Xa level in 6 hours and daily while on heparin Continue to monitor H&H and platelets  Paulina Fusi, PharmD, BCPS 04/02/2020 8:48 PM

## 2020-04-02 NOTE — Progress Notes (Signed)
[facility] Follow Up Note  Patient ID: Claudia Chavez, female   DOB: 1955/10/23, 64 y.o.   MRN: [id number]  HISTORY: She returns today in follow-up.  She states that there has been no change in her clinical condition as far as her cough and shortness of breath ago.  The only thing that she has noticed that she has had bilateral ankle swelling left greater than right.  This started a few days before her admission to the hospital and has progressed since    Vitals:   04/02/20 1042  BP: 107/70  Pulse: (!) 112  Temp: (!) 94.2 F (34.6 C)  SpO2: (!) 74%     EXAM:  Resp: Lungs are distant bilaterally.  No respiratory distress, normal effort. Heart:  Regular without murmurs Abd:  Abdomen is soft, non distended and non tender. No masses are palpable.  There is no rebound and no guarding.  Neurological: Alert and oriented to person, place, and time. Coordination normal.  Skin: Skin is warm and dry. No rash noted. No diaphoretic. No erythema. No pallor.  Psychiatric: Normal mood and affect. Normal behavior. Judgment and thought content normal.   There is bilateral ankle/foot swelling without calf pain or tenderness to palpation.  No erythema.  The chest tube site is clean and dry.  Redressed.  Air leak noted.  Independent review of CXRay shows no pneumothorax or effusion  ASSESSMENT: Lung Abscess Bilateral foot swelling rule out DVT   PLAN:   May shower and keep tube site covered LE doppler studies now RTC one week with CXRay and to see Maurice March, MD

## 2020-04-02 NOTE — ED Notes (Signed)
Attempted to call report, RN not ready. Extension provided for call-back

## 2020-04-02 NOTE — ED Notes (Signed)
Pt provided light snack per her request

## 2020-04-03 DIAGNOSIS — I824Y3 Acute embolism and thrombosis of unspecified deep veins of proximal lower extremity, bilateral: Secondary | ICD-10-CM | POA: Diagnosis not present

## 2020-04-03 DIAGNOSIS — J449 Chronic obstructive pulmonary disease, unspecified: Secondary | ICD-10-CM

## 2020-04-03 DIAGNOSIS — I82403 Acute embolism and thrombosis of unspecified deep veins of lower extremity, bilateral: Secondary | ICD-10-CM | POA: Diagnosis not present

## 2020-04-03 DIAGNOSIS — I82409 Acute embolism and thrombosis of unspecified deep veins of unspecified lower extremity: Secondary | ICD-10-CM | POA: Diagnosis present

## 2020-04-03 LAB — PROTIME-INR
INR: 1.1 (ref 0.8–1.2)
Prothrombin Time: 13.6 seconds (ref 11.4–15.2)

## 2020-04-03 LAB — BASIC METABOLIC PANEL
Anion gap: 8 (ref 5–15)
BUN: 26 mg/dL — ABNORMAL HIGH (ref 8–23)
CO2: 26 mmol/L (ref 22–32)
Calcium: 8.4 mg/dL — ABNORMAL LOW (ref 8.9–10.3)
Chloride: 101 mmol/L (ref 98–111)
Creatinine, Ser: 0.51 mg/dL (ref 0.44–1.00)
GFR calc Af Amer: >60 mL/min (ref >60)
GFR calc non Af Amer: >60 mL/min (ref >60)
Glucose, Bld: 94 mg/dL (ref 70–99)
Potassium: 3.6 mmol/L (ref 3.5–5.1)
Sodium: 135 mmol/L (ref 135–145)

## 2020-04-03 LAB — CBC
HCT: 28.2 % — ABNORMAL LOW (ref 36.0–46.0)
Hemoglobin: 8.9 g/dL — ABNORMAL LOW (ref 12.0–15.0)
MCH: 27.7 pg (ref 26.0–34.0)
MCHC: 31.6 g/dL (ref 30.0–36.0)
MCV: 87.9 fL (ref 80.0–100.0)
Platelets: 469 10*3/uL — ABNORMAL HIGH (ref 150–400)
RBC: 3.21 MIL/uL — ABNORMAL LOW (ref 3.87–5.11)
RDW: 17.2 % — ABNORMAL HIGH (ref 11.5–15.5)
WBC: 12.2 10*3/uL — ABNORMAL HIGH (ref 4.0–10.5)
nRBC: 0.2 % (ref 0.0–0.2)

## 2020-04-03 LAB — HIV ANTIBODY (ROUTINE TESTING W REFLEX): HIV Screen 4th Generation wRfx: NONREACTIVE

## 2020-04-03 LAB — HEPARIN LEVEL (UNFRACTIONATED): Heparin Unfractionated: <0.1 IU/mL — ABNORMAL LOW (ref 0.30–0.70)

## 2020-04-03 MED ORDER — APIXABAN 5 MG PO TABS: 5.0000 mg | ORAL_TABLET | Freq: Two times a day (BID) | ORAL | Status: DC

## 2020-04-03 MED ORDER — GUAIFENESIN-DM 100-10 MG/5ML PO SYRP: 5.0000 mL | ORAL_SOLUTION | ORAL | Status: DC | PRN

## 2020-04-03 MED ORDER — HEPARIN BOLUS VIA INFUSION
1000.0000 [IU] | Freq: Once | INTRAVENOUS | Status: AC
  Administered 2020-04-03: 1000 [IU] via INTRAVENOUS
  Filled 2020-04-03: qty 1000

## 2020-04-03 MED ORDER — GUAIFENESIN-DM 100-10 MG/5ML PO SYRP: 5.0000 mL | ORAL_SOLUTION | ORAL | 0 refills | Status: DC | PRN

## 2020-04-03 MED ORDER — APIXABAN 5 MG PO TABS
10.0000 mg | ORAL_TABLET | Freq: Two times a day (BID) | ORAL | Status: DC
  Administered 2020-04-03: 10 mg via ORAL
  Filled 2020-04-03: qty 2

## 2020-04-03 MED ORDER — APIXABAN 5 MG PO TABS: 10.0000 mg | ORAL_TABLET | Freq: Two times a day (BID) | ORAL | 3 refills | Status: DC

## 2020-04-03 NOTE — Progress Notes (Signed)
Patient ID: Claudia Chavez, female   DOB: 06-25-1956, 64 y.o.   MRN: [id number] D/w Dr Ardelle Park is a good candidate for mech thrombectomy for DVT LE. This can be done as out pt next week. Pt will be switched to po eliquis and will f/u vascular next week. Compression stockings are recommended by Dr Milderd Meager with [facility] and she is in agreement with plan

## 2020-04-03 NOTE — Consult Note (Signed)
[facility] [place]  Patient name: Claudia Chavez MRN: [id number] DOB: [birth year] Sex: female   REQUESTING PROVIDER:    Dr. Posey Pronto   REASON FOR CONSULT:    DVT  HISTORY OF PRESENT ILLNESS:   Claudia Chavez is a 64 y.o. female, who was admitted on 03/27/2020 for a mycobacterial infection in her right lung that failed conservative therapy.  She has a history of right lower lobectomy for lung cancer 5 years ago.  She had a percutaneous drain placed.  She has a Heimlich valve.  She was discharged the following day.  She then developed bilateral leg swelling and went to the ER on 04/02/2020.  Venous ultrasound showed bilateral DVT.  She was admitted for heparinization.  PAST MEDICAL HISTORY    Past Medical History:  Diagnosis Date  . Cancer of lower lobe of right lung ([place]) 05/07/2015  . COPD (chronic obstructive pulmonary disease) ([place])   . Dyspnea   . Non-small cell lung cancer ([place])   . Pneumonia 04/2015  . Pulmonary Mycobacterium avium complex (MAC) infection ([place]) 10/25/2018     FAMILY HISTORY   Family History  Problem Relation Age of Onset  . Stroke Mother   . Lung cancer Father 82    SOCIAL HISTORY:   Social History   Socioeconomic History  . Marital status: Widowed    Spouse name: Not on file  . Number of children: Not on file  . Years of education: Not on file  . Highest education level: Not on file  Occupational History  . Not on file  Tobacco Use  . Smoking status: Former Smoker    Packs/day: 0.50    Years: 40.00    Pack years: 20.00    Types: Cigarettes    Quit date: 12/01/2019    Years since quitting: 0.3  . Smokeless tobacco: Never Used  Vaping Use  . Vaping Use: Never used  Substance and Sexual Activity  . Alcohol use: Not Currently    Alcohol/week: 2.0 - 4.0 standard drinks    Types: 2 - 4 Cans of beer per week    Comment: beer-occasionally  . Drug use: No  . Sexual activity: Never    Other Topics Concern  . Not on file  Social History Narrative  . Not on file   Social Determinants of Health   Financial Resource Strain:   . Difficulty of Paying Living Expenses:   Food Insecurity:   . Worried About Charity fundraiser in the Last Year:   . Arboriculturist in the Last Year:   Transportation Needs:   . Film/video editor (Medical):   Marland Kitchen Lack of Transportation (Non-Medical):   Physical Activity:   . Days of Exercise per Week:   . Minutes of Exercise per Session:   Stress:   . Feeling of Stress :   Social Connections:   . Frequency of Communication with Friends and Family:   . Frequency of Social Gatherings with Friends and Family:   . Attends Religious Services:   . Active Member of Clubs or Organizations:   . Attends Archivist Meetings:   Marland Kitchen Marital Status:   Intimate Partner Violence:   . Fear of Current or Ex-Partner:   . Emotionally Abused:   Marland Kitchen Physically Abused:   . Sexually Abused:     ALLERGIES:    Allergies  Allergen Reactions  . Keppra [Levetiracetam] Other (See Comments)    Nausea and dizzy  .  Lyrica [Pregabalin] Other (See Comments)    Made patient feel very dizzy and not feel good.     CURRENT MEDICATIONS:    Current Facility-Administered Medications  Medication Dose Route Frequency Provider Last Rate Last Admin  . acetaminophen (TYLENOL) tablet 650 mg  650 mg Oral Q6H PRN Mansy, Jan A, MD   650 mg at 04/02/20 2317   Or  . acetaminophen (TYLENOL) suppository 650 mg  650 mg Rectal Q6H PRN Mansy, Jan A, MD      . albuterol (PROVENTIL) (2.5 MG/3ML) 0.083% nebulizer solution 2.5 mg  2.5 mg Inhalation Q6H PRN Mansy, Jan A, MD      . apixaban (ELIQUIS) tablet 10 mg  10 mg Oral BID Fritzi Mandes, MD   10 mg at 04/03/20 1424   Followed by  . [START ON 04/10/2020] apixaban (ELIQUIS) tablet 5 mg  5 mg Oral BID Fritzi Mandes, MD      . benzonatate (TESSALON) capsule 100 mg  100 mg Oral TID PRN Mansy, Jan A, MD   100 mg at 04/03/20  0410  . cholecalciferol (VITAMIN D3) tablet 2,000 Units  2,000 Units Oral Daily Mansy, Jan A, MD   2,000 Units at 04/03/20 1109  . guaiFENesin-dextromethorphan (ROBITUSSIN DM) 100-10 MG/5ML syrup 5 mL  5 mL Oral Q4H PRN Mansy, Jan A, MD      . HYDROcodone-acetaminophen (NORCO/VICODIN) 5-325 MG per tablet 1 tablet  1 tablet Oral Q4H PRN Mansy, Jan A, MD      . ipratropium-albuterol (DUONEB) 0.5-2.5 (3) MG/3ML nebulizer solution 3 mL  3 mL Nebulization Q4H PRN Mansy, Jan A, MD      . magnesium hydroxide (MILK OF MAGNESIA) suspension 30 mL  30 mL Oral Daily PRN Mansy, Jan A, MD      . mometasone-formoterol [facility]) 200-5 MCG/ACT inhaler 2 puff  2 puff Inhalation BID Mansy, Arvella Merles, MD   2 puff at 04/03/20 0950  . ondansetron (ZOFRAN) tablet 4 mg  4 mg Oral Q6H PRN Mansy, Jan A, MD       Or  . ondansetron [facility]) injection 4 mg  4 mg Intravenous Q6H PRN Mansy, Jan A, MD      . tiotropium [facility]) inhalation capsule [facility] use ONLY) 18 mcg  18 mcg Inhalation Daily Mansy, Jan A, MD   18 mcg at 04/03/20 1110  . traZODone (DESYREL) tablet 25 mg  25 mg Oral QHS PRN Mansy, Jan A, MD   25 mg at 04/02/20 2317  . vitamin B-12 (CYANOCOBALAMIN) tablet 1,000 mcg  1,000 mcg Oral Daily Mansy, Jan A, MD   1,000 mcg at 04/03/20 1109   Current Outpatient Medications  Medication Sig Dispense Refill  . albuterol (PROVENTIL HFA;VENTOLIN HFA) 108 (90 Base) MCG/ACT inhaler Inhale 2 puffs into the lungs every 6 (six) hours as needed for wheezing or shortness of breath.     . benzonatate (TESSALON) 100 MG capsule TAKE 1 CAPSULE BY MOUTH THREE TIMES A DAY AS NEEDED FOR COUGH 90 capsule 3  . Cholecalciferol (VITAMIN D3) 1000 units CAPS Take 2,000 Units by mouth daily.     . Fluticasone-Salmeterol (ADVAIR DISKUS) 250-50 MCG/DOSE AEPB Inhale 1 puff into the lungs every 12 (twelve) hours.     Marland Kitchen HYDROcodone-acetaminophen (NORCO/VICODIN) 5-325 MG tablet Take 1 tablet by mouth every 4 (four) hours as needed for moderate pain or  severe pain. 30 tablet 0  . ipratropium-albuterol (DUONEB) 0.5-2.5 (3) MG/3ML SOLN Take 3 mLs by nebulization every 4 (four) hours as  needed. 360 mL 3  . megestrol (MEGACE ES) 625 MG/5ML suspension Take 5 mLs (625 mg total) by mouth daily. 150 mL 0  . ondansetron (ZOFRAN) 4 MG tablet Take 1 tablet (4 mg total) by mouth every 8 (eight) hours as needed for nausea or vomiting. 20 tablet 3  . Respiratory Therapy Supplies (FLUTTER) DEVI 1 each by Does not apply route daily. 1 each 0  . rifabutin (MYCOBUTIN) 150 MG capsule Take 2 capsules (300 mg total) by mouth daily. 60 capsule 3  . SPIRIVA RESPIMAT 2.5 MCG/ACT AERS Inhale 2 puffs into the lungs daily.     . vitamin B-12 (CYANOCOBALAMIN) 1000 MCG tablet Take 1,000 mcg by mouth daily.    Marland Kitchen apixaban (ELIQUIS) 5 MG TABS tablet Take 2 tablets (10 mg total) by mouth 2 (two) times daily. And then 5 mg twice  Daily starting 04/10/2020 60 tablet 3  . guaiFENesin-dextromethorphan (ROBITUSSIN DM) 100-10 MG/5ML syrup Take 5 mLs by mouth every 4 (four) hours as needed for cough (chest congestion). 118 mL 0   Facility-Administered Medications Ordered in Other Encounters  Medication Dose Route Frequency Provider Last Rate Last Admin  . sodium chloride flush (NS) 0.9 % injection 10 mL  10 mL Intravenous PRN Cammie Sickle, MD        REVIEW OF SYSTEMS:   _0  denotes positive finding, _1  denotes negative finding Cardiac  Comments:  Chest pain or chest pressure:    Shortness of breath upon exertion:    Short of breath when lying flat:    Irregular heart rhythm:        Vascular    Pain in calf, thigh, or hip brought on by ambulation:    Pain in feet at night that wakes you up from your sleep:     Blood clot in your veins:    Leg swelling:  x       Pulmonary    Oxygen at home:    Productive cough:     Wheezing:         Neurologic    Sudden weakness in arms or legs:     Sudden numbness in arms or legs:     Sudden onset of difficulty speaking or  slurred speech:    Temporary loss of vision in one eye:     Problems with dizziness:         Gastrointestinal    Blood in stool:      Vomited blood:         Genitourinary    Burning when urinating:     Blood in urine:        Psychiatric    Major depression:         Hematologic    Bleeding problems:    Problems with blood clotting too easily:        Skin    Rashes or ulcers:        Constitutional    Fever or chills:     PHYSICAL EXAM:   Vitals:   04/02/20 2256 04/02/20 2327 04/03/20 0411 04/03/20 1346  BP: 100/60 (!) 103/59 (!) 153/73 (!) 117/59  Pulse: 88 79 91 (!) 110  Resp: (!) _2 Temp: 97.7 F (36.5 C)  98.3 F (36.8 C) 97.6 F (36.4 C)  TempSrc: Oral  Oral Oral  SpO2: 100% 100% 98% 98%  Weight:      Height:        GENERAL: The patient is  a well-nourished female, in no acute distress. The vital signs are documented above. CARDIAC: There is a regular rate and rhythm.  VASCULAR: 1-2+ pitting edema to the left leg.  Trace edema to the right.  Palpable dorsalis pedis pulses. PULMONARY: Nonlabored respirations ABDOMEN: Soft and non-tender with normal pitched bowel sounds.  MUSCULOSKELETAL: There are no major deformities or cyanosis. NEUROLOGIC: No focal weakness or paresthesias are detected. SKIN: There are no ulcers or rashes noted. PSYCHIATRIC: The patient has a normal affect.  STUDIES:   I have reviewed her venous u/s with the following findigns: RIGHT LOWER EXTREMITY  Common Femoral Vein: No evidence of thrombus. Normal compressibility, respiratory phasicity and response to augmentation.  Saphenofemoral Junction: No evidence of thrombus. Normal compressibility and flow on color Doppler imaging.  Profunda Femoral Vein: No evidence of thrombus. Normal compressibility and flow on color Doppler imaging.  Femoral Vein: No evidence of thrombus. Normal compressibility, respiratory phasicity and response to augmentation.  Popliteal Vein:  Occlusive thrombus is noted.  Calf Veins: Occlusive thrombus is noted in the posterior tibial vein. The peroneal veins are not well visualized.  Superficial Great Saphenous Vein: No evidence of thrombus. Normal compressibility.  Venous Reflux:  None.  Other Findings:  None.  LEFT LOWER EXTREMITY  Common Femoral Vein: Occlusive thrombus is noted.  Saphenofemoral Junction: Occlusive thrombus is noted.  Profunda Femoral Vein: Occlusive thrombus is noted.  Femoral Vein: Occlusive thrombus is noted.  Popliteal Vein: Occlusive thrombus is noted.  Calf Veins: Occlusive thrombus is noted in the posterior tibial veins. The peroneal veins are not well appreciated.  Superficial Great Saphenous Vein: No evidence of thrombus. Normal compressibility.  Venous Reflux:  None.  Other Findings:  None.  ASSESSMENT and PLAN   Bilateral DVT, left greater than right: I discussed with the patient that I would recommend anticoagulation only for her right leg DVT since it is in the popliteal vein.  She has more significant findings on the left leg, which is her more symptomatic leg.  The DVT extends up at least to the common femoral vein.  We discussed that she would potentially be a candidate for mechanical thrombectomy.  This cannot be done until next week.  I told her that it would be okay for her to go home on anticoagulation and see how her leg does over the next several days.  If she continues to have persistent leg swelling and discomfort, she would be a candidate for intervention on the left leg.  The office will contact her early next week to see how she is doing and to determine whether or not to proceed with intervention.   Leia Alf, MD, FACS [facility] [phone] Pager [phone]

## 2020-04-03 NOTE — Progress Notes (Signed)
Ted hose placed on patient and patient educated. IV Heparin discontinued; PO eliquis given. Discharge instructions reviewed with patient; patient is adequate for discharge.

## 2020-04-03 NOTE — Discharge Summary (Signed)
[place] [facility] [place] NAME: Claudia Chavez    MR#:  [id number]  DATE OF BIRTH:  Feb 28, 1956  DATE OF ADMISSION:  04/02/2020 ADMITTING PHYSICIAN: Fritzi Mandes, MD  DATE OF DISCHARGE: 04/03/2020  PRIMARY CARE PHYSICIAN: Maryland Pink, MD    ADMISSION DIAGNOSIS:  DVT of lower extremity, bilateral ([place]) [I82.403] DVT of lower limb, acute ([place]) [I82.409]  DISCHARGE DIAGNOSIS:  Bilateral Lower extremity DVT left>right--now on oral anticoagulant  SECONDARY DIAGNOSIS:   Past Medical History:  Diagnosis Date  . Cancer of lower lobe of right lung ([place]) 05/07/2015  . COPD (chronic obstructive pulmonary disease) ([place])   . Dyspnea   . Non-small cell lung cancer ([place])   . Pneumonia 04/2015  . Pulmonary Mycobacterium avium complex (MAC) infection ([place]) 10/25/2018    HOSPITAL COURSE:  DeborahSharpeis a81 y.o.Caucasian femalewith a known history of right lower lobe non-small cell lung cancer status post right lower lobectomy 5 years ago, and right pulmonary MAC infectionstatus post completion of treatment withrifabutin as well as COPD who had a large abscess in the right upper lobe that was drained and a chest tube was placed on 03/26/2020 by IR. She started developing bilateral lower extremity swelling withoutassociated pain. She was seen by Dr. Genevive Bi and her bilateral lower extremity venous Doppler that showed bilateral lower extremity DVT there is much worse on the left than the right  1. Bilateral lower extremity DVT with extensive DVT on the left. -  on IV heparin --change to po eliquis -vascular consultation with Dr. Trula Slade evaluation if patient is candidate for mechanical thrombectomy--recommends f/u next weeks as out pt and will be done later date -start oral anticoagulation with Apixaban -d/w pt and she is in agreement with the plan  2. COPD without exacerbation. -continue her nebulizer therapy, inhalers and Spiriva.  3. Right  pulmonary MAC infection status post failure and abscess, status post cutaneous drainage with right chest pigtail catheter placed by IR on 03/26/2020.The patient has a history of right lower lobe small cell lung cancer status post lobectomy. -She continues to take rifabutin--follows with Dr Delaine Lame  4. DVT prophylaxis. - on IV heparin--now on po eliquis    CODE STATUS: Full code  Status is: Observation  The patient remains OBS appropriate and will d/c before 2 midnights.  Dispo: The patient is from: Home              Anticipated d/c is to: Home              Anticipated d/c date VE:[facility]NO              Patient currently is medically stable to d/c.  Pt overall doing well. Ok to d/c from vascular standpoint           CONSULTS OBTAINED:  Treatment Team:  Lloyd Huger, MD Algernon Huxley, MD  DRUG ALLERGIES:   Allergies  Allergen Reactions  . Keppra [Levetiracetam] Other (See Comments)    Nausea and dizzy  . Lyrica [Pregabalin] Other (See Comments)    Made patient feel very dizzy and not feel good.     DISCHARGE MEDICATIONS:   Allergies as of 04/03/2020      Reactions   Keppra [levetiracetam] Other (See Comments)   Nausea and dizzy   Lyrica [pregabalin] Other (See Comments)   Made patient feel very dizzy and not feel good.       Medication List    TAKE these medications  Advair Diskus 250-50 MCG/DOSE Aepb Generic drug: Fluticasone-Salmeterol Inhale 1 puff into the lungs every 12 (twelve) hours.   albuterol 108 (90 Base) MCG/ACT inhaler Commonly known as: VENTOLIN HFA Inhale 2 puffs into the lungs every 6 (six) hours as needed for wheezing or shortness of breath.   apixaban 5 MG Tabs tablet Commonly known as: ELIQUIS Take 2 tablets (10 mg total) by mouth 2 (two) times daily. And then 5 mg twice  Daily starting 04/10/2020   benzonatate 100 MG capsule Commonly known as: TESSALON TAKE 1 CAPSULE BY MOUTH THREE TIMES A DAY AS NEEDED FOR COUGH    Flutter Devi 1 each by Does not apply route daily.   guaiFENesin-dextromethorphan 100-10 MG/5ML syrup Commonly known as: ROBITUSSIN DM Take 5 mLs by mouth every 4 (four) hours as needed for cough (chest congestion).   HYDROcodone-acetaminophen 5-325 MG tablet Commonly known as: NORCO/VICODIN Take 1 tablet by mouth every 4 (four) hours as needed for moderate pain or severe pain.   ipratropium-albuterol 0.5-2.5 (3) MG/3ML Soln Commonly known as: DUONEB Take 3 mLs by nebulization every 4 (four) hours as needed.   megestrol 625 MG/5ML suspension Commonly known as: MEGACE ES Take 5 mLs (625 mg total) by mouth daily.   ondansetron 4 MG tablet Commonly known as: Zofran Take 1 tablet (4 mg total) by mouth every 8 (eight) hours as needed for nausea or vomiting.   rifabutin 150 MG capsule Commonly known as: MYCOBUTIN Take 2 capsules (300 mg total) by mouth daily.   Spiriva Respimat 2.5 MCG/ACT Aers Generic drug: Tiotropium Bromide Monohydrate Inhale 2 puffs into the lungs daily.   vitamin B-12 1000 MCG tablet Commonly known as: CYANOCOBALAMIN Take 1,000 mcg by mouth daily.   Vitamin D3 25 MCG (1000 UT) Caps Take 2,000 Units by mouth daily.       If you experience worsening of your admission symptoms, develop shortness of breath, life threatening emergency, suicidal or homicidal thoughts you must seek medical attention immediately by calling 911 or calling your MD immediately  if symptoms less severe.  You Must read complete instructions/literature along with all the possible adverse reactions/side effects for all the Medicines you take and that have been prescribed to you. Take any new Medicines after you have completely understood and accept all the possible adverse reactions/side effects.   Please note  You were cared for by a hospitalist during your hospital stay. If you have any questions about your discharge medications or the care you received while you were in the  hospital after you are discharged, you can call the unit and asked to speak with the hospitalist on call if the hospitalist that took care of you is not available. Once you are discharged, your primary care physician will handle any further medical issues. Please note that NO REFILLS for any discharge medications will be authorized once you are discharged, as it is imperative that you return to your primary care physician (or establish a relationship with a primary care physician if you do not have one) for your aftercare needs so that they can reassess your need for medications and monitor your lab values. T DATA REVIEW:   CBC  Recent Labs  Lab 04/03/20 0622  WBC 12.2*  HGB 8.9*  HCT 28.2*  PLT 469*    Chemistries  Recent Labs  Lab 04/02/20 1546 04/02/20 1546 04/03/20 0622  NA 132*   < > 135  K 4.6   < > 3.6  CL 97*   < >  101  CO2 24   < > 26  GLUCOSE 104*   < > 94  BUN 27*   < > 26*  CREATININE 0.66   < > 0.51  CALCIUM 8.3*   < > 8.4*  AST 15  --   --   ALT 10  --   --   ALKPHOS 135*  --   --   BILITOT 0.5  --   --    < > = values in this interval not displayed.    Microbiology Results   Recent Results (from the past 240 hour(s))  SARS Coronavirus 2 by RT PCR (hospital order, performed in [facility] hospital lab) Nasopharyngeal Nasopharyngeal Swab     Status: None   Collection Time: 03/29/20 10:37 AM   Specimen: Nasopharyngeal Swab  Result Value Ref Range Status   SARS Coronavirus 2 NEGATIVE NEGATIVE Final    Comment: (NOTE) SARS-CoV-2 target nucleic acids are NOT DETECTED.  The SARS-CoV-2 RNA is generally detectable in upper and lower respiratory specimens during the acute phase of infection. The lowest concentration of SARS-CoV-2 viral copies this assay can detect is 250 copies / mL. A negative result does not preclude SARS-CoV-2 infection and should not be used as the sole basis for treatment or other patient management decisions.  A negative result may occur  with improper specimen collection / handling, submission of specimen other than nasopharyngeal swab, presence of viral mutation(s) within the areas targeted by this assay, and inadequate number of viral copies (<250 copies / mL). A negative result must be combined with clinical observations, patient history, and epidemiological information.  Fact Sheet for Patients:   StrictlyIdeas.no  Fact Sheet for Healthcare Providers: BankingDealers.co.za  This test is not yet approved or  cleared by the [place] FDA and has been authorized for detection and/or diagnosis of SARS-CoV-2 by FDA under an Emergency Use Authorization (EUA).  This EUA will remain in effect (meaning this test can be used) for the duration of the COVID-19 declaration under Section 564(b)(1) of the Act, 21 U.S.C. section 360bbb-3(b)(1), unless the authorization is terminated or revoked sooner.  Performed at [facility], [address] [place], [place] [address]   Aerobic/Anaerobic Culture (surgical/deep wound)     Status: None (Preliminary result)   Collection Time: 03/29/20  5:38 PM   Specimen: Wound; Abscess  Result Value Ref Range Status   Specimen Description   Final    WOUND Performed at [facility], [address], [place], [place] [address]    Special Requests   Final    Normal Performed at [facility], [address] [place], [place] [address]    Gram Stain   Final    ABUNDANT WBC PRESENT, PREDOMINANTLY PMN NO ORGANISMS SEEN    Culture   Final    RARE FUNGUS ISOLATED IDENTIFICATION TO FOLLOW [facility] NO ANAEROBES ISOLATED Performed at [facility], [place] [address], [place], [place] [address]    Report Status PENDING  Incomplete  SARS Coronavirus 2 by RT PCR (hospital order, performed in [facility] hospital lab) Nasopharyngeal Nasopharyngeal Swab     Status: None   Collection Time: 04/02/20  9:27 PM    Specimen: Nasopharyngeal Swab  Result Value Ref Range Status   SARS Coronavirus 2 NEGATIVE NEGATIVE Final    Comment: (NOTE) SARS-CoV-2 target nucleic acids are NOT DETECTED.  The SARS-CoV-2 RNA is generally detectable in upper and lower respiratory specimens during the acute phase of  infection. The lowest concentration of SARS-CoV-2 viral copies this assay can detect is 250 copies / mL. A negative result does not preclude SARS-CoV-2 infection and should not be used as the sole basis for treatment or other patient management decisions.  A negative result may occur with improper specimen collection / handling, submission of specimen other than nasopharyngeal swab, presence of viral mutation(s) within the areas targeted by this assay, and inadequate number of viral copies (<250 copies / mL). A negative result must be combined with clinical observations, patient history, and epidemiological information.  Fact Sheet for Patients:   StrictlyIdeas.no  Fact Sheet for Healthcare Providers: BankingDealers.co.za  This test is not yet approved or  cleared by the [place] FDA and has been authorized for detection and/or diagnosis of SARS-CoV-2 by FDA under an Emergency Use Authorization (EUA).  This EUA will remain in effect (meaning this test can be used) for the duration of the COVID-19 declaration under Section 564(b)(1) of the Act, 21 U.S.C. section 360bbb-3(b)(1), unless the authorization is terminated or revoked sooner.  Performed at [facility], [address] [place], [place] [address]     RADIOLOGY:  DG Chest 2 View  Result Date: 04/02/2020 CLINICAL DATA:  Check chest tube placement EXAM: CHEST - 2 VIEW COMPARISON:  03/30/2020 FINDINGS: Pigtail catheter is again noted on the right stable in appearance from the prior exam. Air-fluid level is noted adjacent to the catheter similar to that noted on prior examination.  The air-fluid collection extends superiorly along the lung apex similar to that noted on prior CT examinations. The overall appearance is stable. Left lung remains clear with the exception of scattered nodule similar to that noted on prior CT. Cardiac shadow is stable. Right chest wall port is again noted. IMPRESSION: Chest tube is stable in appearance. The right hydropneumothorax is again noted and stable. Multiple nodular densities in the left lung. Electronically Signed   By: Inez Catalina M.D.   On: 04/02/2020 14:30   US Venous Img Lower Bilateral (DVT)  Result Date: 04/02/2020 CLINICAL DATA:  Bilateral foot swelling for 1 week, history of prior DVTs EXAM: BILATERAL LOWER EXTREMITY VENOUS DOPPLER ULTRASOUND TECHNIQUE: Gray-scale sonography with graded compression, as well as color Doppler and duplex ultrasound were performed to evaluate the lower extremity deep venous systems from the level of the common femoral vein and including the common femoral, femoral, profunda femoral, popliteal and calf veins including the posterior tibial, peroneal and gastrocnemius veins when visible. The superficial great saphenous vein was also interrogated. Spectral Doppler was utilized to evaluate flow at rest and with distal augmentation maneuvers in the common femoral, femoral and popliteal veins. COMPARISON:  None. FINDINGS: RIGHT LOWER EXTREMITY Common Femoral Vein: No evidence of thrombus. Normal compressibility, respiratory phasicity and response to augmentation. Saphenofemoral Junction: No evidence of thrombus. Normal compressibility and flow on color Doppler imaging. Profunda Femoral Vein: No evidence of thrombus. Normal compressibility and flow on color Doppler imaging. Femoral Vein: No evidence of thrombus. Normal compressibility, respiratory phasicity and response to augmentation. Popliteal Vein: Occlusive thrombus is noted. Calf Veins: Occlusive thrombus is noted in the posterior tibial vein. The peroneal veins are  not well visualized. Superficial Great Saphenous Vein: No evidence of thrombus. Normal compressibility. Venous Reflux:  None. Other Findings:  None. LEFT LOWER EXTREMITY Common Femoral Vein: Occlusive thrombus is noted. Saphenofemoral Junction: Occlusive thrombus is noted. Profunda Femoral Vein: Occlusive thrombus is noted. Femoral Vein: Occlusive thrombus is noted. Popliteal Vein: Occlusive thrombus is noted.  Calf Veins: Occlusive thrombus is noted in the posterior tibial veins. The peroneal veins are not well appreciated. Superficial Great Saphenous Vein: No evidence of thrombus. Normal compressibility. Venous Reflux:  None. Other Findings:  None. IMPRESSION: Bilateral deep venous thrombosis much worse on the left than the right as described above. Electronically Signed   By: Inez Catalina M.D.   On: 04/02/2020 14:24     CODE STATUS:     Code Status Orders  (From admission, onward)         Start     Ordered   04/02/20 2136  Full code  Continuous        04/02/20 2141        Code Status History    Date Active Date Inactive Code Status Order ID Comments User Context   03/29/2020 1028 03/30/2020 2017 Full Code [id number]  Nestor Lewandowsky, MD Inpatient   03/26/2020 1248 03/29/2020 0906 Full Code [id number]  Nestor Lewandowsky, MD Outpatient   06/07/2018 1728 06/09/2018 1745 Full Code [id number]  Fritzi Mandes, MD Inpatient   05/24/2018 1141 05/25/2018 1651 Full Code [id number]  Gorden Harms, MD Inpatient   08/18/2015 2014 08/23/2015 2219 Full Code [id number]  Nestor Lewandowsky, MD Inpatient   Advance Care Planning Activity    Advance Directive Documentation     Most Recent Value  Type of Advance Directive Healthcare Power of Attorney, Living will  Pre-existing out of facility DNR order (yellow form or pink MOST form) --  "MOST" Form in Place? --       TOTAL TIME TAKING CARE OF THIS PATIENT: *40* minutes.    Fritzi Mandes M.D  [facility]    CC: Primary care physician; [place] Pink,  MD

## 2020-04-03 NOTE — Progress Notes (Addendum)
[place] [facility] [place] NAME: Claudia Chavez    MR#:  [id number]  DATE OF BIRTH:  05-16-1956  SUBJECTIVE:   Patient came in with gradual swelling both lower extremity left more than right. Found to have extensive DVT left lower extremity. Currently on IV heparin drip. She has some chronic cough due to her chronic lung infection. Recently underwent chest tube placement CT guided for right-sided effusion. REVIEW OF SYSTEMS:   Review of Systems  Constitutional: Negative for chills, fever and weight loss.  HENT: Negative for ear discharge, ear pain and nosebleeds.   Eyes: Negative for blurred vision, pain and discharge.  Respiratory: Positive for cough. Negative for sputum production, shortness of breath, wheezing and stridor.   Cardiovascular: Positive for leg swelling. Negative for chest pain, palpitations, orthopnea and PND.  Gastrointestinal: Negative for abdominal pain, diarrhea, nausea and vomiting.  Genitourinary: Negative for frequency and urgency.  Musculoskeletal: Negative for back pain and joint pain.  Neurological: Negative for sensory change, speech change, focal weakness and weakness.  Psychiatric/Behavioral: Negative for depression and hallucinations. The patient is not nervous/anxious.    Tolerating Diet:yes Tolerating PT: pending  DRUG ALLERGIES:   Allergies  Allergen Reactions  . Keppra [Levetiracetam] Other (See Comments)    Nausea and dizzy  . Lyrica [Pregabalin] Other (See Comments)    Made patient feel very dizzy and not feel good.     VITALS:  Blood pressure (!) 153/73, pulse 91, temperature 98.3 F (36.8 C), temperature source Oral, resp. rate 18, height _0  (1.549 m), weight 36.3 kg, SpO2 98 %.  PHYSICAL EXAMINATION:   Physical Exam  GENERAL:  64 y.o.-year-old patient lying in the bed with no acute distress. Thin EYES: Pupils equal, round, reactive to light and accommodation. No scleral icterus.   HEENT: Head  atraumatic, normocephalic. Oropharynx and nasopharynx clear.  LUNGS:distant coarse breath sounds bilaterally, no wheezing, rales, rhonchi. No use of accessory muscles of respiration.  Right CT+(POA) CARDIOVASCULAR: S1, S2 normal. No murmurs, rubs, or gallops.  ABDOMEN: Soft, nontender, nondistended. Bowel sounds present. No organomegaly or mass.  EXTREMITIES:left LE swelling>right Good pedal pulses NEUROLOGIC: Cranial nerves II through XII are intact. No focal Motor or sensory deficits b/l.   PSYCHIATRIC:  patient is alert and oriented x 3.  SKIN: No obvious rash, lesion, or ulcer.   LABORATORY PANEL:  CBC Recent Labs  Lab 04/03/20 0622  WBC 12.2*  HGB 8.9*  HCT 28.2*  PLT 469*    Chemistries  Recent Labs  Lab 04/02/20 1546 04/02/20 1546 04/03/20 0622  NA 132*   < > 135  K 4.6   < > 3.6  CL 97*   < > 101  CO2 24   < > 26  GLUCOSE 104*   < > 94  BUN 27*   < > 26*  CREATININE 0.66   < > 0.51  CALCIUM 8.3*   < > 8.4*  AST 15  --   --   ALT 10  --   --   ALKPHOS 135*  --   --   BILITOT 0.5  --   --    < > = values in this interval not displayed.   Cardiac Enzymes No results for input(s): TROPONINI in the last 168 hours. RADIOLOGY:  DG Chest 2 View  Result Date: 04/02/2020 CLINICAL DATA:  Check chest tube placement EXAM: CHEST - 2 VIEW COMPARISON:  03/30/2020 FINDINGS: Pigtail catheter is again noted  on the right stable in appearance from the prior exam. Air-fluid level is noted adjacent to the catheter similar to that noted on prior examination. The air-fluid collection extends superiorly along the lung apex similar to that noted on prior CT examinations. The overall appearance is stable. Left lung remains clear with the exception of scattered nodule similar to that noted on prior CT. Cardiac shadow is stable. Right chest wall port is again noted. IMPRESSION: Chest tube is stable in appearance. The right hydropneumothorax is again noted and stable. Multiple nodular densities  in the left lung. Electronically Signed   By: Inez Catalina M.D.   On: 04/02/2020 14:30   US Venous Img Lower Bilateral (DVT)  Result Date: 04/02/2020 CLINICAL DATA:  Bilateral foot swelling for 1 week, history of prior DVTs EXAM: BILATERAL LOWER EXTREMITY VENOUS DOPPLER ULTRASOUND TECHNIQUE: Gray-scale sonography with graded compression, as well as color Doppler and duplex ultrasound were performed to evaluate the lower extremity deep venous systems from the level of the common femoral vein and including the common femoral, femoral, profunda femoral, popliteal and calf veins including the posterior tibial, peroneal and gastrocnemius veins when visible. The superficial great saphenous vein was also interrogated. Spectral Doppler was utilized to evaluate flow at rest and with distal augmentation maneuvers in the common femoral, femoral and popliteal veins. COMPARISON:  None. FINDINGS: RIGHT LOWER EXTREMITY Common Femoral Vein: No evidence of thrombus. Normal compressibility, respiratory phasicity and response to augmentation. Saphenofemoral Junction: No evidence of thrombus. Normal compressibility and flow on color Doppler imaging. Profunda Femoral Vein: No evidence of thrombus. Normal compressibility and flow on color Doppler imaging. Femoral Vein: No evidence of thrombus. Normal compressibility, respiratory phasicity and response to augmentation. Popliteal Vein: Occlusive thrombus is noted. Calf Veins: Occlusive thrombus is noted in the posterior tibial vein. The peroneal veins are not well visualized. Superficial Great Saphenous Vein: No evidence of thrombus. Normal compressibility. Venous Reflux:  None. Other Findings:  None. LEFT LOWER EXTREMITY Common Femoral Vein: Occlusive thrombus is noted. Saphenofemoral Junction: Occlusive thrombus is noted. Profunda Femoral Vein: Occlusive thrombus is noted. Femoral Vein: Occlusive thrombus is noted. Popliteal Vein: Occlusive thrombus is noted. Calf Veins: Occlusive  thrombus is noted in the posterior tibial veins. The peroneal veins are not well appreciated. Superficial Great Saphenous Vein: No evidence of thrombus. Normal compressibility. Venous Reflux:  None. Other Findings:  None. IMPRESSION: Bilateral deep venous thrombosis much worse on the left than the right as described above. Electronically Signed   By: Inez Catalina M.D.   On: 04/02/2020 14:24   ASSESSMENT AND PLAN:   Claudia Chavez  is a 64 y.o. Caucasian female with a known history of right lower lobe non-small cell lung cancer status post right lower lobectomy 5 years ago, and right pulmonary MAC infection status post completion of treatment with rifabutin as well as COPD who had a large abscess in the right upper lobe that was drained and a chest tube was placed on 03/26/2020 by IR.  She started developing bilateral lower extremity swelling without associated pain.  She was seen by Dr. Genevive Bi and her bilateral lower extremity venous Doppler that showed bilateral lower extremity DVT there is much worse on the left than the right  1.  Bilateral lower extremity DVT with extensive DVT on the left. - continue her on IV heparin  -vascular consultation with Dr. Trula Slade evaluation if patient is candidate for mechanical thrombectomy. Await further recommendations. -Hold oral anticoagulation  2.  COPD without exacerbation. -continue her  nebulizer therapy, inhalers and Spiriva.  3.  Right pulmonary MAC infection status post failure and abscess, status post cutaneous drainage with right chest pigtail catheter placed by IR on 03/26/2020.  The patient has a history of right lower lobe small cell lung cancer status post lobectomy. -She takes rifabutin--follows with Dr Delaine Lame  4.  DVT prophylaxis. - on IV heparin    CODE STATUS: Full code  Status is: Inpatient  Remains inpatient appropriate because: treatment for severe large occlusive DVT left lower extremity. Awaiting vascular consultation in  their recommendations.  Dispo: The patient is from: Home  Anticipated d/c is to: Home  Anticipated d/c date is:1-2 2 days  Patient currently is not medically stable to d/c.    TOTAL TIME TAKING CARE OF THIS PATIENT: *35* minutes.  >50% time spent on counselling and coordination of care  Note: This dictation was prepared with Dragon dictation along with smaller phrase technology. Any transcriptional errors that result from this process are unintentional.  Fritzi Mandes M.D    [facility]   CC: Primary care physician; Maryland Pink, MDPatient ID: Georgianne Fick, female   DOB: 10-13-1955, 64 y.o.   MRN: [id number]

## 2020-04-03 NOTE — Care Management CC44 (Signed)
Condition Code 44 Documentation Completed  Patient Details  Name: Claudia Chavez MRN: [id number] Date of Birth: 1956/06/27   Condition Code 44 given:  Yes Patient signature on Condition Code 44 notice:  Yes (code 68 verbally reviewed with patient, patient agreed) Documentation of 2 MD's agreement:  Yes Code 44 added to claim:  Yes    Elliot Gurney [place], [place] 04/03/2020, 4:11 PM

## 2020-04-03 NOTE — Care Management Obs Status (Signed)
[place] NOTIFICATION   Patient Details  Name: Claudia Chavez MRN: [id number] Date of Birth: 01-24-1956   Medicare Observation Status Notification Given:  Yes    Elliot Gurney [place], [place] 04/03/2020, 4:10 PM

## 2020-04-03 NOTE — Discharge Instructions (Signed)
Wear compression stocking during daytime

## 2020-04-03 NOTE — Progress Notes (Addendum)
ANTICOAGULATION CONSULT NOTE - Initial Consult  Pharmacy Consult for Heparin Drip Indication: DVT  Allergies  Allergen Reactions  . Keppra [Levetiracetam] Other (See Comments)    Nausea and dizzy  . Lyrica [Pregabalin] Other (See Comments)    Made patient feel very dizzy and not feel good.     Patient Measurements: Height: _0  (154.9 cm) Weight: 36.3 kg (80 lb) IBW/kg (Calculated) : 47.8 Heparin Dosing Weight:  36.3 kg  Vital Signs: Temp: 98.3 F (36.8 C) (07/03 0411) Temp Source: Oral (07/03 0411) BP: 153/73 (07/03 0411) Pulse Rate: 91 (07/03 0411)  Labs: Recent Labs    04/02/20 1546 04/03/20 0622  HGB 8.9* 8.9*  HCT 27.9* 28.2*  PLT 396 469*  APTT 31  --   LABPROT 14.3 13.6  INR 1.2 1.1  HEPARINUNFRC  --  <0.10*  CREATININE 0.66 0.51    Estimated Creatinine Clearance: 40.2 mL/min (by C-G formula based on SCr of 0.51 mg/dL).   Medical History: Past Medical History:  Diagnosis Date  . Cancer of lower lobe of right lung ([place]) 05/07/2015  . COPD (chronic obstructive pulmonary disease) ([place])   . Dyspnea   . Non-small cell lung cancer ([place])   . Pneumonia 04/2015  . Pulmonary Mycobacterium avium complex (MAC) infection ([place]) 10/25/2018    Assessment: Patient is a 64yo female being admitted for DVT. Pharmacy consulted for Heparin dosing. No prior anticoagulants noted. Baseline labs within range.  7/3 0622 HL < 0.1  Date INR Warfarin Dose  7/2 1.2 2.5 mg  7/3 1.1 None    Goal of Therapy:  Heparin level 0.3-0.7 units/ml Monitor platelets by anticoagulation protocol: Yes   Plan:  Heparin level is subtherapeutic. Will give heparin bolus of 1000 units and increase infusion rate to 700 units/hr. Patient has low body weight. Spoke with Dr. Posey Pronto, to assess if patient qualifies for DOAC vs warfarin. Discussion with Dr. Posey Pronto, will continue with heparin alone and plan to speak with vascular for possible thrombectomy. CBC daily while on heparin.   Eleonore Chiquito,  PharmD, BCPS Clinical Pharmacist 04/03/2020 8:08 AM

## 2020-04-05 LAB — ORGANISM IDENTIFICATION, MOLD: Source of Sample: 8474

## 2020-04-05 LAB — MOLD ORGANISM REFLEX

## 2020-04-08 ENCOUNTER — Encounter: Payer: Self-pay | Admitting: Physician Assistant

## 2020-04-08 ENCOUNTER — Inpatient Hospital Stay: Payer: PPO | Attending: Internal Medicine

## 2020-04-08 ENCOUNTER — Other Ambulatory Visit: Payer: Self-pay

## 2020-04-08 ENCOUNTER — Encounter: Payer: Self-pay | Admitting: Dietician

## 2020-04-08 ENCOUNTER — Ambulatory Visit (INDEPENDENT_AMBULATORY_CARE_PROVIDER_SITE_OTHER): Payer: PPO | Admitting: Physician Assistant

## 2020-04-08 VITALS — BP 111/59 | HR 91 | Temp 97.6°F | Ht 61.0 in | Wt 84.4 lb

## 2020-04-08 DIAGNOSIS — I82403 Acute embolism and thrombosis of unspecified deep veins of lower extremity, bilateral: Secondary | ICD-10-CM

## 2020-04-08 NOTE — Progress Notes (Signed)
Nutrition Assessment   Reason for Assessment: consult for wt loss per Dr. Rogue Bussing   ASSESSMENT: 64 year old female with history of stage III lung cancer, stage I right upper lobe cancer s/p radiation Feb 2019, persistent MAC infections/p chemotherapy currently on second line therapy and tolerating poorly.  She is followed by pulmonary and is s/p open drainage of lung abscess on 6/25.  Briefly spoke with patient via phone, patient reports on her way out to follow-up doctors appointment s/p recent DVT requiring hospital admission on 7/02-7/03. Patient reports that she is eating better, family and friends are constantly bringing food over and she eats when she gets hungry throughout the day, reports always eating dinner, drinks 2 Boost High Protein and 2 bottles of water daily. Patient reports improvements to breathing after having drain placed, says she has been coughing more. Patient denied nausea or vomiting, reports having regular bowel movements, takes a daily laxative.  Nutrition Focused Physical Exam: unable to complete, assessment completed via phone   Medications: Hydrocodone, Megace, Zofran   Labs: 7/03 WBC 12.2 (H), Hgb 8.9 (L)   Anthropometrics: Weight on 7/08 84 lb 6.4 oz increased from 80 lb on 7/02  6/29 - 84 lb 14 oz 5/26 - 88 lb 2.9 oz 4/01 - 97 lb 3/09 - 94 lb 1/29 - 93 lb 8 oz   Height: 5'1" Weight: (38.3 kg) 84 lb 6.4 oz UBW: 95-99 lb (2020) BMI: 15.95 (underweight)   Estimated Energy Needs  Kcals: 1500-1700 Protein: 75-85 Fluid: >1.6 L   NUTRITION DIAGNOSIS: Inadequate oral intake related to cancer and cancer related treatment as evidenced by SOB,    INTERVENTION:  Discussed the importance of adequate nutrition Encouraged Boost Plus, Ensure Plus, Ensure Enlive for increased calories/protien Encouraged small frequent meals throughout vs 3 larger meals daily Discussed ways to increase calories/protein with meals (adding butter, using whole milk to  prepare oatmeal, soups, preparing milkshakes with supplements)    MONITORING, EVALUATION, GOAL: weight trends, po intake   Next Visit: To be determined  Lajuan Lines, RD, LDN Clinical Nutrition After Hours/Weekend Pager # in [place]

## 2020-04-08 NOTE — Progress Notes (Signed)
[facility] SUR[facility]ICAL CL[facility]IC NOTE  04/08/2020  History of Present Illness: Claudia Chavez is a 64 y.o. female with history of right lung abscess/empyema s/p percutaneous chest tube placement on 06/28. She was seen in follow up with Dr Genevive Bi on 07/02 and endorsed significant lower extremity edema bilaterally. [place] was ordered and revealed significant bilateral DVTs. She was admitted to the Hospitalist and started on Anticoagulation. While admitted she was seen by vascular surgery and recommended she be switched to Eliquis (10 mg BID) and could be followed up outpatient for thrombectomy. She was discharged home on 07/03.   Today, she reports that she is feeling better. She reports that the swelling in her RLE has improved but her left lower extremity remains fairly edematous. She has been wearing her compression stockings. Otherwise, no issues from her chest tube standpoint. She denied any fever, chills, CP, or worsening baseline SOB. Drainage from chest tube has remained somewhat purulent. No other complaints this afternoon.    Past Medical History: Past Medical History:  Diagnosis Date  . Cancer of lower lobe of right lung ([place]) 05/07/2015  . COPD (chronic obstructive pulmonary disease) ([place])   . Dyspnea   . Non-small cell lung cancer ([place])   . Pneumonia 04/2015  . Pulmonary Mycobacterium avium complex (MAC) infection ([place]) 10/25/2018     Past Surgical History: Past Surgical History:  Procedure Laterality Date  . ABDOMINAL HYSTERECTOMY    . COLONOSCOPY WITH PROPOFOL N/A 06/09/2018   Procedure: COLONOSCOPY WITH PROPOFOL;  Surgeon: Toledo, Benay Pike, MD;  Location: [facility] ENDOSCOPY;  Service: Gastroenterology;  Laterality: N/A;  . ELBOW SURGERY Right 1995  . ELECTROMAGNETIC NAVIGATION BROCHOSCOPY N/A 10/25/2017   Procedure: ELECTROMAGNETIC NAVIGATION BRONCHOSCOPY;  Surgeon: [name], MD;  Location: [facility] ORS;  Service: Cardiopulmonary;  Laterality: N/A;  .  ESOPHAGOGASTRODUODENOSCOPY N/A 06/08/2018   Procedure: ESOPHAGOGASTRODUODENOSCOPY (EGD);  Surgeon: Toledo, Benay Pike, MD;  Location: [facility] ENDOSCOPY;  Service: Gastroenterology;  Laterality: N/A;  . FLEXIBLE BRONCHOSCOPY Right 10/10/2018   Procedure: FLEXIBLE BRONCHOSCOPY;  Surgeon: [name], MD;  Location: [facility] ORS;  Service: Cardiopulmonary;  Laterality: Right;  . PORTACATH PLACEMENT Right 05/10/2015   Procedure: INSERTION PORT-A-CATH;  Surgeon: Nestor Lewandowsky, MD;  Location: [facility] ORS;  Service: General;  Laterality: Right;  Marland Kitchen VIDEO ASSISTED THORACOSCOPY (VATS)/THOROCOTOMY Right 08/18/2015   Procedure: VIDEO ASSISTED THORACOSCOPY (VATS)/THOROCOTOMY;  Surgeon: Nestor Lewandowsky, MD;  Location: [facility] ORS;  Service: General;  Laterality: Right;    Home Medications: Prior to Admission medications   Medication Sig Start Date End Date Taking? Authorizing Provider  albuterol (PROVENTIL HFA;VENTOLIN HFA) 108 (90 Base) MCG/ACT inhaler Inhale 2 puffs into the lungs every 6 (six) hours as needed for wheezing or shortness of breath.     [provider]  apixaban (ELIQUIS) 5 MG TABS tablet Take 2 tablets (10 mg total) by mouth 2 (two) times daily. And then 5 mg twice  Daily starting 04/10/2020 04/03/20   Fritzi Mandes, MD  benzonatate (TESSALON) 100 MG capsule TAKE 1 CAPSULE BY MOUTH THREE TIMES A DAY AS NEEDED FOR COUGH 03/08/20   Cammie Sickle, MD  Cholecalciferol (VITAMIN D3) 1000 units CAPS Take 2,000 Units by mouth daily.     [provider]  Fluticasone-Salmeterol (ADVAIR DISKUS) 250-50 MCG/DOSE AEPB Inhale 1 puff into the lungs every 12 (twelve) hours.  09/05/17   [provider]  guaiFENesin-dextromethorphan (ROBITUSSIN DM) 100-10 MG/5ML syrup Take 5 mLs by mouth every 4 (four) hours as needed for cough (chest congestion). 04/03/20  Fritzi Mandes, MD  HYDROcodone-acetaminophen (NORCO/VICODIN) 5-325 MG tablet Take 1 tablet by mouth every 4 (four) hours as needed for moderate pain or  severe pain. 03/30/20   Nestor Lewandowsky, MD  ipratropium-albuterol (DUONEB) 0.5-2.5 (3) MG/3ML SOLN Take 3 mLs by nebulization every 4 (four) hours as needed. 09/09/19   Cammie Sickle, MD  megestrol (MEGACE ES) 625 MG/5ML suspension Take 5 mLs (625 mg total) by mouth daily. 03/10/20   Cammie Sickle, MD  ondansetron (ZOFRAN) 4 MG tablet Take 1 tablet (4 mg total) by mouth every 8 (eight) hours as needed for nausea or vomiting. 03/08/20   Parrett, Fonnie Mu, NP  Respiratory Therapy Supplies (FLUTTER) DEVI 1 each by Does not apply route daily. 09/12/18   [name], MD  rifabutin (MYCOBUTIN) 150 MG capsule Take 2 capsules (300 mg total) by mouth daily. 01/01/20   Tsosie Billing, MD  SPIRIVA RESPIMAT 2.5 MCG/ACT AERS Inhale 2 puffs into the lungs daily.  10/17/19   [provider]  vitamin B-12 (CYANOCOBALAMIN) 1000 MCG tablet Take 1,000 mcg by mouth daily.    [provider]    Allergies: Allergies  Allergen Reactions  . Keppra [Levetiracetam] Other (See Comments)    Nausea and dizzy  . Lyrica [Pregabalin] Other (See Comments)    Made patient feel very dizzy and not feel good.     Review of Systems: Review of Systems  Constitutional: Negative for chills and fever.  HENT: Negative for congestion and sore throat.   Respiratory: Positive for shortness of breath (Baseline). Negative for cough.   Cardiovascular: Positive for leg swelling. Negative for chest pain and palpitations.  Gastrointestinal: Negative for abdominal pain, diarrhea, nausea and vomiting.  Neurological: Negative for dizziness and headaches.  All other systems reviewed and are negative.   Physical Exam There were no vitals taken for this visit.  Physical Exam Vitals and nursing note reviewed. Exam conducted with a chaperone present.  Constitutional:      General: She is not in acute distress.    Appearance: Normal appearance. She is normal weight. She is not ill-appearing.     Comments:  Frail appearing female, NAD, sitting in wheelchair, daughter at bedside  HENT:     Head: Normocephalic and atraumatic.  Eyes:     General: No scleral icterus.    Conjunctiva/sclera: Conjunctivae normal.  Cardiovascular:     Rate and Rhythm: Normal rate and regular rhythm.     Pulses: Normal pulses.     Heart sounds: No murmur heard.   Pulmonary:     Effort: Pulmonary effort is normal. No respiratory distress.     Breath sounds: Normal breath sounds.  Chest:    Genitourinary:    Comments: Deferred Musculoskeletal:        General: Normal range of motion.     Right lower leg: Edema (Trace) present.     Left lower leg: Edema (2+) present.     Comments: She continues to have significant edema to the left lower extremity, much worse compared to the right. This is 2+ at a minimum from the foot to the ankle. PT/DP pulses are 1+ but suspect swelling makes this challenging. She did not have a Homan's Sign bilaterally on my examination. Compression stockings in place  Skin:    General: Skin is warm and dry.     Coloration: Skin is not pale.     Findings: No erythema.  Neurological:     General: No focal deficit present.  Mental Status: She is alert and oriented to person, place, and time.  Psychiatric:        Mood and Affect: Mood normal.        Behavior: Behavior normal.     Labs/Imaging: No new pertinent labs or imaging studies  Assessment and Plan: This is a 64 y.o. female with history of right lung abscess/empyema s/p percutaneous chest tube placement on 06/28 found to have bilateral DVT   - I will provide a referral to [facility] for evaluation for thrombectomy  - She should continue Eliquis 10 mg BID as prescribed  - Continue compression stockings or tight ACE wrapping on bilateral lower extremities  - Maintain chest tube; monitor output  - Follow up with Dr Genevive Bi on as needed basis for now  Face-to-face time spent with the patient and care providers was  15 minutes, with more than 50% of the time spent counseling, educating, and coordinating care of the patient.     Edison Simon, PA-C [facility] 04/08/2020, 11:21 AM [phone] M-F: 7am - 4pm

## 2020-04-08 NOTE — Patient Instructions (Addendum)
Patient recommended to continue with her prescription for Eliquis. Referral was sent to [facility] with Dr.Dew.  Once referral is received they will contact you for an appointment.    Deep Vein Thrombosis  Deep vein thrombosis (DVT) is a condition in which a blood clot forms in a deep vein, such as a lower leg, thigh, or arm vein. A clot is blood that has thickened into a gel or solid. This condition is dangerous. It can lead to serious and even life-threatening complications if the clot travels to the lungs and causes a blockage (pulmonary embolism). It can also damage veins in the leg. This can result in leg pain, swelling, discoloration, and sores (post-thrombotic syndrome). What are the causes? This condition may be caused by:  A slowdown of blood flow.  Damage to a vein.  A condition that causes blood to clot more easily, such as an inherited clotting disorder. What increases the risk? The following factors may make you more likely to develop this condition:  Being overweight.  Being older, especially over age 47.  Sitting or lying down for more than four hours.  Being in the hospital.  Lack of physical activity (sedentary lifestyle).  Pregnancy, being in childbirth, or having recently given birth.  Taking medicines that contain estrogen, such as medicines to prevent pregnancy.  Smoking.  A history of any of the following: ? Blood clots or a blood clotting disease. ? Peripheral vascular disease. ? Inflammatory bowel disease. ? Cancer. ? Heart disease. ? Genetic conditions that affect how your blood clots, such as Factor V Leiden mutation. ? Neurological diseases that affect your legs (leg paresis). ? A recent injury, such as a car accident. ? Major or lengthy surgery. ? A central line placed inside a large vein. What are the signs or symptoms? Symptoms of this condition include:  Swelling, pain, or tenderness in an arm or leg.  Warmth, redness,  or discoloration in an arm or leg. If the clot is in your leg, symptoms may be more noticeable or worse when you stand or walk. Some people may not develop any symptoms. How is this diagnosed? This condition is diagnosed with:  A medical history and physical exam.  Tests, such as: ? Blood tests. These are done to check how well your blood clots. ? Ultrasound. This is done to check for clots. ? Venogram. For this test, contrast dye is injected into a vein and X-rays are taken to check for any clots. How is this treated? Treatment for this condition depends on:  The cause of your DVT.  Your risk for bleeding or developing more clots.  Any other medical conditions that you have. Treatment may include:  Taking a blood thinner (anticoagulant). This type of medicine prevents clots from forming. It may be taken by mouth, injected under the skin, or injected through an IV (catheter).  Injecting clot-dissolving medicines into the affected vein (catheter-directed thrombolysis).  Having surgery. Surgery may be done to: ? Remove the clot. ? Place a filter in a large vein to catch blood clots before they reach the lungs. Some treatments may be continued for up to six months. Follow these instructions at home: If you are taking blood thinners:  Take the medicine exactly as told by your health care provider. Some blood thinners need to be taken at the same time every day. Do not skip a dose.  Talk with your health care provider before you take any medicines that contain aspirin or  NSAIDs. These medicines increase your risk for dangerous bleeding.  Ask your health care provider about foods and drugs that could change the way the medicine works (may interact). Avoid those things if your health care provider tells you to do so.  Blood thinners can cause easy bruising and may make it difficult to stop bleeding. Because of this: ? Be very careful when using knives, scissors, or other sharp  objects. ? Use an electric razor instead of a blade. ? Avoid activities that could cause injury or bruising, and follow instructions about how to prevent falls.  Wear a medical alert bracelet or carry a card that lists what medicines you take. General instructions  Take over-the-counter and prescription medicines only as told by your health care provider.  Return to your normal activities as told by your health care provider. Ask your health care provider what activities are safe for you.  Wear compression stockings if recommended by your health care provider.  Keep all follow-up visits as told by your health care provider. This is important. How is this prevented? To lower your risk of developing this condition again:  For 30 or more minutes every day, do an activity that: ? Involves moving your arms and legs. ? Increases your heart rate.  When traveling for longer than four hours: ? Exercise your arms and legs every hour. ? Drink plenty of water. ? Avoid drinking alcohol.  Avoid sitting or lying for a long time without moving your legs.  If you have surgery or you are hospitalized, ask about ways to prevent blood clots. These may include taking frequent walks or using anticoagulants.  Stay at a healthy weight.  If you are a woman who is older than age 80, avoid unnecessary use of medicines that contain estrogen, such as some birth control pills.  Do not use any products that contain nicotine or tobacco, such as cigarettes and e-cigarettes. This is especially important if you take estrogen medicines. If you need help quitting, ask your health care provider. Contact a health care provider if:  You miss a dose of your blood thinner.  Your menstrual period is heavier than usual.  You have unusual bruising. Get help right away if:  You have: ? New or increased pain, swelling, or redness in an arm or leg. ? Numbness or tingling in an arm or leg. ? Shortness of  breath. ? Chest pain. ? A rapid or irregular heartbeat. ? A severe headache or confusion. ? A cut that will not stop bleeding.  There is blood in your vomit, stool, or urine.  You have a serious fall or accident, or you hit your head.  You feel light-headed or dizzy.  You cough up blood. These symptoms may represent a serious problem that is an emergency. Do not wait to see if the symptoms will go away. Get medical help right away. Call your local emergency services (911 in the [place] Do not drive yourself to the hospital. Summary  Deep vein thrombosis (DVT) is a condition in which a blood clot forms in a deep vein, such as a lower leg, thigh, or arm vein.  Symptoms can include swelling, warmth, pain, and redness in your leg or arm.  This condition may be treated with a blood thinner (anticoagulant medicine), medicine that is injected to dissolve blood clots,compression stockings, or surgery.  If you are prescribed blood thinners, take them exactly as told. This information is not intended to replace advice given to you by  your health care provider. Make sure you discuss any questions you have with your health care provider. Document Revised: 08/31/2017 Document Reviewed: 02/16/2017 Elsevier Patient Education  [place].

## 2020-04-13 ENCOUNTER — Ambulatory Visit: Payer: Self-pay | Admitting: Physician Assistant

## 2020-04-13 LAB — AEROBIC/ANAEROBIC CULTURE W GRAM STAIN (SURGICAL/DEEP WOUND): Special Requests: NORMAL

## 2020-04-16 ENCOUNTER — Ambulatory Visit: Payer: PPO | Admitting: Primary Care

## 2020-04-30 ENCOUNTER — Telehealth: Payer: Self-pay

## 2020-04-30 DIAGNOSIS — J869 Pyothorax without fistula: Secondary | ICD-10-CM

## 2020-04-30 NOTE — Telephone Encounter (Signed)
Patient scheduled and notified to have chest xray-patient verbalized understanding.

## 2020-05-04 ENCOUNTER — Ambulatory Visit (INDEPENDENT_AMBULATORY_CARE_PROVIDER_SITE_OTHER): Payer: PPO | Admitting: Physician Assistant

## 2020-05-04 ENCOUNTER — Other Ambulatory Visit: Payer: Self-pay

## 2020-05-04 ENCOUNTER — Encounter: Payer: Self-pay | Admitting: Infectious Diseases

## 2020-05-04 ENCOUNTER — Ambulatory Visit: Payer: PPO | Attending: Infectious Diseases | Admitting: Infectious Diseases

## 2020-05-04 ENCOUNTER — Encounter: Payer: Self-pay | Admitting: Physician Assistant

## 2020-05-04 ENCOUNTER — Ambulatory Visit
Admission: RE | Admit: 2020-05-04 | Discharge: 2020-05-04 | Disposition: A | Discharge status: Home / Self Care | Payer: PPO | Attending: Cardiothoracic Surgery | Admitting: Cardiothoracic Surgery

## 2020-05-04 ENCOUNTER — Telehealth: Payer: Self-pay

## 2020-05-04 ENCOUNTER — Ambulatory Visit
Admission: RE | Admit: 2020-05-04 | Discharge: 2020-05-04 | Disposition: A | Discharge status: Home / Self Care | Payer: PPO | Source: Ambulatory Visit | Attending: Cardiothoracic Surgery | Admitting: Cardiothoracic Surgery

## 2020-05-04 ENCOUNTER — Other Ambulatory Visit
Admission: RE | Admit: 2020-05-04 | Discharge: 2020-05-04 | Disposition: A | Discharge status: Home / Self Care | Payer: PPO | Source: Home / Self Care | Attending: Infectious Diseases | Admitting: Infectious Diseases

## 2020-05-04 VITALS — BP 104/67 | HR 98 | Temp 97.7°F | Resp 18 | Ht 61.0 in | Wt 76.0 lb

## 2020-05-04 VITALS — BP 102/66 | HR 104 | Temp 98.0°F | Resp 16 | Ht 61.0 in | Wt 84.0 lb

## 2020-05-04 DIAGNOSIS — Z79899 Other long term (current) drug therapy: Secondary | ICD-10-CM | POA: Diagnosis not present

## 2020-05-04 DIAGNOSIS — Z85118 Personal history of other malignant neoplasm of bronchus and lung: Secondary | ICD-10-CM | POA: Insufficient documentation

## 2020-05-04 DIAGNOSIS — J852 Abscess of lung without pneumonia: Secondary | ICD-10-CM | POA: Insufficient documentation

## 2020-05-04 DIAGNOSIS — Z87891 Personal history of nicotine dependence: Secondary | ICD-10-CM | POA: Insufficient documentation

## 2020-05-04 DIAGNOSIS — R0602 Shortness of breath: Secondary | ICD-10-CM | POA: Diagnosis not present

## 2020-05-04 DIAGNOSIS — J449 Chronic obstructive pulmonary disease, unspecified: Secondary | ICD-10-CM | POA: Diagnosis not present

## 2020-05-04 DIAGNOSIS — R05 Cough: Secondary | ICD-10-CM | POA: Diagnosis not present

## 2020-05-04 DIAGNOSIS — Z7901 Long term (current) use of anticoagulants: Secondary | ICD-10-CM | POA: Diagnosis not present

## 2020-05-04 DIAGNOSIS — J869 Pyothorax without fistula: Secondary | ICD-10-CM

## 2020-05-04 DIAGNOSIS — Z681 Body mass index (BMI) 19 or less, adult: Secondary | ICD-10-CM

## 2020-05-04 DIAGNOSIS — R0902 Hypoxemia: Secondary | ICD-10-CM | POA: Diagnosis not present

## 2020-05-04 DIAGNOSIS — Z902 Acquired absence of lung [part of]: Secondary | ICD-10-CM

## 2020-05-04 DIAGNOSIS — R634 Abnormal weight loss: Secondary | ICD-10-CM

## 2020-05-04 DIAGNOSIS — R911 Solitary pulmonary nodule: Secondary | ICD-10-CM | POA: Diagnosis not present

## 2020-05-04 MED ORDER — TRAMADOL HCL 50 MG PO TABS: 50.0000 mg | ORAL_TABLET | Freq: Four times a day (QID) | ORAL | 0 refills | Status: DC | PRN

## 2020-05-04 NOTE — Patient Instructions (Addendum)
Keep your appointment on Friday. Use the spirometer throughout the day. Please have the chest xray prior to your appointment on Friday. Pick up your medication at the pharmacy.

## 2020-05-04 NOTE — Patient Instructions (Addendum)
You are here for the lung  [place], abscess, and today we will do some labs to check for aspergillus. You also have lost 8 more pounds- so I have let Your oncologist know about giving an appetitie stimulant Oxygen while walking dropped in the low 80S. I have asked Dr.KAsa to consider home oxygen

## 2020-05-04 NOTE — Progress Notes (Signed)
NAME: Claudia Chavez  DOB: 02/26/1956  MRN: [id number]  Date/Time: 05/04/2020 10:14 AM  Dr.Kasa Maretta Bees Subjective:   Follow up visit after chest drain placement Patient with history of MAC pulmonary infection who took treatment   with rifabutin, ethambutol and azithromycin since 11/11/2018.  Because of progression of the cavitary lesion amikacin infusion was started on 02/06/2020. Patient continued to have persisting cough and was also losing weight with poor appetite and had gone to see pulmonologist and a chest x-ray was done on 03/08/2020..  The chest x-ray showed thick-walled cavitary lesion in the right lung apex with a layering air-fluid level.  A CT scan was done on 03/18/2020 which showed a worsening cavity on the right lung..  I had seen patient on the same day and had stopped all her antimycobacterial treatment because of her losing weight of nearly 15 pounds in 2 months. She also was having poor energy then.  She saw Dr.oaks was admitted and underwent CT guided drainage of the loculated fluid in the rt lung on 03/29/20- cultures sent had aspergillus She is seeing me today to decide on treatment of the aspergillus which could be colonization of the cavity She has lost another 8 pounds since her last visit in June. She also had b/l lower extremityDVT in July 2021 and was in the hopsitlal  Medical history She is a 64 year old female who has a  history of Lung cancer , COPD Patient was diagnosed non-small cell right lung cancer in July 2016 and underwent 2 cycles of chemotherapy with Taxol and carboplatin  .   She developed  neuropathy.  And chemo was stopped.  She had right lower lobe resection  in November 2016 at [facility] by Dr. Faith Rogue.  This was followed by radiation therapy to the right lung in February 2017 for 30 days.  In May 2017 a repeat CAT scan was noted as interval right lower lobe resection without evidence of local recurrence or definite metastatic disease. IShe was followed by oncology  with serial CAT scans and PET scans. PET scan Feb 9th 2018- no evidence of recurrence; postoperative changes/stable right hilar lymph node without any activity noted. On 05/25/17 CT showed partial cavitary right upper lobe pulmonary nodule, most consistent with metastatic disease versus metachronous primary.  She was seen by pulmonologist Dr.Casa and underwent bronch and biopsy on 10/25/17.  Pathology report was VERY SCANT LUNG TISSUE WITH PIGMENTED MACROPHAGES. - NO NEOPLASM IS IDENTIFIED IN THIS SPECIMEN She received  SBRT  to the right lung X 5 doses-   Subsequently she was complaining of cough and productive sputum and she was treated with multiple courses of antibiotics including Levaquin.  A repeat CT scan was done in  Jan 2020 and it showed Thick-walled, partially cavitary process in the right lung apex measuring 5.4 x 7.4 cm (series 3/image 34), progressive, previously 4.2 x 2.9 cm.  Additional thick-walled cavitary process posteriorly in the right upper lobe measuring 8.1 x 5.5 cm (series 3/image 61), previously 7.7 x 5.1 cm when measured in a similar fashion.      On 10/10/2018 she underwent bronchoscopy and bronchoalveolar lavage was sent for AFB and fungal and bacterial cultures.  The pathology showed BENIGN ALVEOLATED LUNG PARENCHYMA AND BRONCHIAL WALL WITH FOCAL  ORGANIZING FIBRIN. - NO EVIDENCE OF TUMOR OR GRANULOMA.    AFB and GMS special stains were negative. The AFB nucleic acid amplification testing was done and that was positive for Mycobacterium avium intracellulare and she was referred to  me in 11/26/2018.Marland Kitchen Started Anti MAC treatment 2018/11/26. Repeat Sputum in Sept had MAC and it was still susceptible to macrolide In Nov 2020 she has stopped meds because of pill fatigue and restarted Dec 2020 CT scan done in March 2021     She was a smoker until December 2019.  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 Husband died in 2016/11/26 of lung cancer as  well.  Past Medical History:  Diagnosis Date  . Cancer of lower lobe of right lung ([place]) 05/07/2015  . COPD (chronic obstructive pulmonary disease) ([place])   . Dyspnea   . Non-small cell lung cancer ([place])   . Pneumonia 04/2015  . Pulmonary Mycobacterium avium complex (MAC) infection ([place]) 10/25/2018    Past Surgical History:  Procedure Laterality Date  . ABDOMINAL HYSTERECTOMY    . COLONOSCOPY WITH PROPOFOL N/A 06/09/2018   Procedure: COLONOSCOPY WITH PROPOFOL;  Surgeon: Toledo, Benay Pike, MD;  Location: [facility] ENDOSCOPY;  Service: Gastroenterology;  Laterality: N/A;  . ELBOW SURGERY Right 1995  . ELECTROMAGNETIC NAVIGATION BROCHOSCOPY N/A 10/25/2017   Procedure: ELECTROMAGNETIC NAVIGATION BRONCHOSCOPY;  Surgeon: [name], MD;  Location: [facility] ORS;  Service: Cardiopulmonary;  Laterality: N/A;  . ESOPHAGOGASTRODUODENOSCOPY N/A 06/08/2018   Procedure: ESOPHAGOGASTRODUODENOSCOPY (EGD);  Surgeon: Toledo, Benay Pike, MD;  Location: [facility] ENDOSCOPY;  Service: Gastroenterology;  Laterality: N/A;  . FLEXIBLE BRONCHOSCOPY Right 10/10/2018   Procedure: FLEXIBLE BRONCHOSCOPY;  Surgeon: [name], MD;  Location: [facility] ORS;  Service: Cardiopulmonary;  Laterality: Right;  . PORTACATH PLACEMENT Right 05/10/2015   Procedure: INSERTION PORT-A-CATH;  Surgeon: Nestor Lewandowsky, MD;  Location: [facility] ORS;  Service: General;  Laterality: Right;  Marland Kitchen VIDEO ASSISTED THORACOSCOPY (VATS)/THOROCOTOMY Right 08/18/2015   Procedure: VIDEO ASSISTED THORACOSCOPY (VATS)/THOROCOTOMY;  Surgeon: Nestor Lewandowsky, MD;  Location: [facility] ORS;  Service: General;  Laterality: Right;    Social History   Socioeconomic History  . Marital status: Widowed    Spouse name: Not on file  . Number of children: Not on file  . Years of education: Not on file  . Highest education level: Not on file  Occupational History  . Not on file  Tobacco Use  . Smoking status: Former Smoker    Packs/day: 0.50    Years: 40.00    Pack years: 20.00    Types:  Cigarettes    Quit date: 12/01/2019    Years since quitting: 0.4  . Smokeless tobacco: Never Used  Vaping Use  . Vaping Use: Never used  Substance and Sexual Activity  . Alcohol use: Not Currently    Alcohol/week: 2.0 - 4.0 standard drinks    Types: 2 - 4 Cans of beer per week    Comment: beer-occasionally  . Drug use: No  . Sexual activity: Never  Other Topics Concern  . Not on file  Social History Narrative  . Not on file   Social Determinants of Health   Financial Resource Strain:   . Difficulty of Paying Living Expenses:   Food Insecurity:   . Worried About Charity fundraiser in the Last Year:   . Arboriculturist in the Last Year:   Transportation Needs:   . Film/video editor (Medical):   Marland Kitchen Lack of Transportation (Non-Medical):   Physical Activity:   . Days of Exercise per Week:   . Minutes of Exercise per Session:   Stress:   . Feeling of Stress :   Social Connections:   . Frequency of Communication with  Friends and Family:   . Frequency of Social Gatherings with Friends and Family:   . Attends Religious Services:   . Active Member of Clubs or Organizations:   . Attends Archivist Meetings:   Marland Kitchen Marital Status:   Intimate Partner Violence:   . Fear of Current or Ex-Partner:   . Emotionally Abused:   Marland Kitchen Physically Abused:   . Sexually Abused:     Family History  Problem Relation Age of Onset  . Stroke Mother   . Lung cancer Father 22   Allergies  Allergen Reactions  . Keppra [Levetiracetam] Other (See Comments)    Nausea and dizzy  . Lyrica [Pregabalin] Other (See Comments)    Made patient feel very dizzy and not feel good.    Current medications  ? Current Outpatient Medications  Medication Sig Dispense Refill  . albuterol (PROVENTIL HFA;VENTOLIN HFA) 108 (90 Base) MCG/ACT inhaler Inhale 2 puffs into the lungs every 6 (six) hours as needed for wheezing or shortness of breath.     Marland Kitchen apixaban (ELIQUIS) 5 MG TABS tablet Take 2 tablets (10  mg total) by mouth 2 (two) times daily. And then 5 mg twice  Daily starting 04/10/2020 60 tablet 3  . benzonatate (TESSALON) 100 MG capsule TAKE 1 CAPSULE BY MOUTH THREE TIMES A DAY AS NEEDED FOR COUGH 90 capsule 3  . Cholecalciferol (VITAMIN D3) 1000 units CAPS Take 2,000 Units by mouth daily.     . Fluticasone-Salmeterol (ADVAIR DISKUS) 250-50 MCG/DOSE AEPB Inhale 1 puff into the lungs every 12 (twelve) hours.     Marland Kitchen guaiFENesin-dextromethorphan (ROBITUSSIN DM) 100-10 MG/5ML syrup Take 5 mLs by mouth every 4 (four) hours as needed for cough (chest congestion). 118 mL 0  . HYDROcodone-acetaminophen (NORCO/VICODIN) 5-325 MG tablet Take 1 tablet by mouth every 4 (four) hours as needed for moderate pain or severe pain. 30 tablet 0  . ipratropium-albuterol (DUONEB) 0.5-2.5 (3) MG/3ML SOLN Take 3 mLs by nebulization every 4 (four) hours as needed. 360 mL 3  . megestrol (MEGACE ES) 625 MG/5ML suspension Take 5 mLs (625 mg total) by mouth daily. 150 mL 0  . ondansetron (ZOFRAN) 4 MG tablet Take 1 tablet (4 mg total) by mouth every 8 (eight) hours as needed for nausea or vomiting. 20 tablet 3  . Respiratory Therapy Supplies (FLUTTER) DEVI 1 each by Does not apply route daily. 1 each 0  . rifabutin (MYCOBUTIN) 150 MG capsule Take 2 capsules (300 mg total) by mouth daily. 60 capsule 3  . SPIRIVA RESPIMAT 2.5 MCG/ACT AERS Inhale 2 puffs into the lungs daily.     . vitamin B-12 (CYANOCOBALAMIN) 1000 MCG tablet Take 1,000 mcg by mouth daily.     No current facility-administered medications for this visit.   Facility-Administered Medications Ordered in Other Visits  Medication Dose Route Frequency Provider Last Rate Last Admin  . sodium chloride flush (NS) 0.9 % injection 10 mL  10 mL Intravenous PRN Cammie Sickle, MD         Abtx:  Anti-infectives (From admission, onward)   None      REVIEW OF SYSTEMS:  Weight loss No fever or chills Has sob even on minimal exertion No diarrhea appetite  okay She drinks 4 cans of boost She has no energy  Objective:  VITALS:  BP 102/66   Pulse (!) 104   Temp 98 F (36.7 C)   Resp 16   Ht _0  (1.549 m)   Wt 84 lb (  38.1 kg)   SpO2 97%   BMI 15.87 kg/m   Pulse ox after taking a few steps was 86-78% PHYSICAL EXAM:  General: emaciated , chronically ill Head: Normocephalic, without obvious abnormality, atraumatic. Eyes: Conjunctivae clear, anicteric sclerae. Pupils are equal ENT did not examine Neck: Supple, symmetrical, no adenopathy, thyroid: non tender no carotid bruit and no JVD. Back: No CVA tenderness. Lungs: Bilateral air entry, decreased on the right side Chest drain- purulent fluid Heart: S1-S2 Abdomen: Soft, non-tender,not distended. Bowel sounds normal. No masses Extremities: atraumatic, no cyanosis. No edema. No clubbing Skin: No rashes or lesions. Or bruising Lymph: Cervical, supraclavicular normal. Neurologic: Grossly non-focal Pertinent Labs Lab Results CBC    Component Value Date/Time   WBC 12.2 (H) 04/03/2020 0622   RBC 3.21 (L) 04/03/2020 0622   HGB 8.9 (L) 04/03/2020 0622   HCT 28.2 (L) 04/03/2020 0622   PLT 469 (H) 04/03/2020 0622   MCV 87.9 04/03/2020 0622   MCH 27.7 04/03/2020 0622   MCHC 31.6 04/03/2020 0622   RDW 17.2 (H) 04/03/2020 0622   LYMPHSABS 0.9 03/18/2020 1257   MONOABS 0.7 03/18/2020 1257   EOSABS 0.1 03/18/2020 1257   BASOSABS 0.1 03/18/2020 1257    CMP Latest Ref Rng & Units 04/03/2020 04/02/2020 03/18/2020  Glucose 70 - 99 mg/dL 94 104(H) 103(H)  BUN 8 - 23 mg/dL 26(H) 27(H) 11  Creatinine 0.44 - 1.00 mg/dL 0.51 0.66 0.45  Sodium 135 - 145 mmol/L 135 132(L) 127(L)  Potassium 3.5 - 5.1 mmol/L 3.6 4.6 3.7  Chloride 98 - 111 mmol/L 101 97(L) 91(L)  CO2 22 - 32 mmol/L _0 Calcium 8.9 - 10.3 mg/dL 8.4(L) 8.3(L) 8.6(L)  Total Protein 6.5 - 8.1 g/dL - 6.5 7.4  Total Bilirubin 0.3 - 1.2 mg/dL - 0.5 0.5  Alkaline Phos 38 - 126 U/L - 135(H) 145(H)  AST 15 - 41 U/L - 15 13(L)   ALT 0 - 44 U/L - 10 9      Microbiology: 10/10/2018 acid-fast organism ID by nucleic acid amplification test is M avium complex.     Sept 2020     Impression/Recommendation 64 y.o.female  with a history of Lung cancer , COPD   Fibrocavitary  lesions in the right lung.  Was treated for Pulmonary MAC for 14 months with no improvement- underwent chest drain placement on 03/19/20 Culture aspergillus Will get fungitell, aspergillus antigen and antibody today- this will differentiate colonization VS true infection  Weight loss of 22 pounds in 2 months- informed Dr.Bramanday oncologist- ? marinol ? megace   Right lung carcinoma.  Status post right lower lobectomy, radiation, recurrence, SBRT.  Followed by Dr. Yevette Edwards and Dr. Mortimer Fries.  Hypoxia- with minimal exerion pulse ox dropped to 85%-76% Let Dr.kasa know that she may need home oxygen  COPD on inhalers and nebulizers Anemia.  .  Smoker: Quit smoking December 2019  Discussed the management with the patient and her sister in law who accompanied her to the visit in great detail. Follow up with the results

## 2020-05-04 NOTE — Telephone Encounter (Signed)
Patient fell in her kitchen and is having a bloody drainage. Has right chest tube percutaneous pigtail catheter insertion 03/30/20. She was instructed to have chest xray after seeing her pulmonologist and come to the office. I will contact Dr.Oaks.

## 2020-05-04 NOTE — Progress Notes (Addendum)
[facility] NOTE  05/04/2020  History of Present Illness: Claudia Chavez is a 64 y.o. female with history of right lung abscess/empyema s/p percutaneous chest tube placement on 06/28 whose post-hospital course was complicated by bilateral lower extremity DVTs and was started on Eliquis BID.   She presents today for follow up after falling in her kitchen at home on Sunday. She reports that she was bending over in her kitchen to pick something up off the floor, but she did not have the strength in her legs to stand back up and fell over on her right side. She did not hit her head or loss consciousness during this. Since that time, she has reported pain in her right side along her lower rib cage which is worse with inspiration. She feels like she can not get a good deep breath. Additionally, on Sunday after the fall she noticed some bloody drainage from the chest tube, but this has since subsided. No fever, chills, nausea, emesis, abdominal pain, or any additional complaints. In regards to her previous bilateral DVTs, she does feel the swelling has significantly improved. No additional issues this afternoon.    Past Medical History: Past Medical History:  Diagnosis Date  . Cancer of lower lobe of right lung ([place]) 05/07/2015  . COPD (chronic obstructive pulmonary disease) ([place])   . Dyspnea   . Non-small cell lung cancer ([place])   . Pneumonia 04/2015  . Pulmonary Mycobacterium avium complex (MAC) infection ([place]) 10/25/2018     Past Surgical History: Past Surgical History:  Procedure Laterality Date  . ABDOMINAL HYSTERECTOMY    . COLONOSCOPY WITH PROPOFOL N/A 06/09/2018   Procedure: COLONOSCOPY WITH PROPOFOL;  Surgeon: Toledo, Benay Pike, MD;  Location: [facility] END[facility]COPY;  Service: Gastroenterology;  Laterality: N/A;  . ELBOW SURGERY Right 1995  . ELECTROMAGNETIC NAVIGATION BROCHOSCOPY N/A 10/25/2017   Procedure: ELECTROMAGNETIC NAVIGATION BRONCHOSCOPY;  Surgeon: [name], MD;  Location: [facility] OR[facility];  Service: Cardiopulmonary;  Laterality: N/A;  . ESOPHAGOGASTRODUODENOSCOPY N/A 06/08/2018   Procedure: ESOPHAGOGASTRODUODENOSCOPY (EGD);  Surgeon: Toledo, Benay Pike, MD;  Location: [facility];  Service: Gastroenterology;  Laterality: N/A;  . FLEXIBLE BRONCHOSCOPY Right 10/10/2018   Procedure: FLEXIBLE BRONCHOSCOPY;  Surgeon: [name], MD;  Location: [facility] OR[facility];  Service: Cardiopulmonary;  Laterality: Right;  . PORTACATH PLACEMENT Right 05/10/2015   Procedure: INSERTION PORT-A-CATH;  Surgeon: Nestor Lewandowsky, MD;  Location: [facility];  Service: General;  Laterality: Right;  Marland Kitchen VIDEO ASSISTED THORACOSCOPY (VATS)/THOROCOTOMY Right 08/18/2015   Procedure: VIDEO ASSISTED THORACOSCOPY (VATS)/THOROCOTOMY;  Surgeon: Nestor Lewandowsky, MD;  Location: [facility] ORS;  Service: General;  Laterality: Right;    Home Medications: Prior to Admission medications   Medication Sig Start Date End Date Taking? Authorizing Provider  albuterol (PROVENTIL HFA;VENTOLIN HFA) 108 (90 Base) MCG/ACT inhaler Inhale 2 puffs into the lungs every 6 (six) hours as needed for wheezing or shortness of breath.    Yes [provider]  apixaban (ELIQUIS) 5 MG TABS tablet Take 2 tablets (10 mg total) by mouth 2 (two) times daily. And then 5 mg twice  Daily starting 04/10/2020 04/03/20  Yes Fritzi Mandes, MD  benzonatate (TESSALON) 100 MG capsule TAKE 1 CAPSULE BY MOUTH THREE TIMES A DAY AS NEEDED FOR COUGH 03/08/20  Yes Cammie Sickle, MD  Cholecalciferol (VITAMIN D3) 1000 units CAPS Take 2,000 Units by mouth daily.    Yes [provider]  Fluticasone-Salmeterol (ADVAIR DISKUS) 250-50 MCG/DOSE AEPB Inhale 1 puff into the lungs every 12 (twelve)  hours.  09/05/17  Yes [provider]  guaiFENesin-dextromethorphan (ROBITUSSIN DM) 100-10 MG/5ML syrup Take 5 mLs by mouth every 4 (four) hours as needed for cough (chest congestion). 04/03/20  Yes Fritzi Mandes, MD  HYDROcodone-acetaminophen  (NORCO/VICODIN) 5-325 MG tablet Take 1 tablet by mouth every 4 (four) hours as needed for moderate pain or severe pain. 03/30/20  Yes Nestor Lewandowsky, MD  ipratropium-albuterol (DUONEB) 0.5-2.5 (3) MG/3ML SOLN Take 3 mLs by nebulization every 4 (four) hours as needed. 09/09/19  Yes Cammie Sickle, MD  megestrol (MEGACE ES) 625 MG/5ML suspension Take 5 mLs (625 mg total) by mouth daily. 03/10/20  Yes Cammie Sickle, MD  ondansetron (ZOFRAN) 4 MG tablet Take 1 tablet (4 mg total) by mouth every 8 (eight) hours as needed for nausea or vomiting. 03/08/20  Yes Parrett, Fonnie Mu, NP  Respiratory Therapy Supplies (FLUTTER) DEVI 1 each by Does not apply route daily. 09/12/18  Yes Kasa, Maretta Bees, MD  rifabutin (MYCOBUTIN) 150 MG capsule Take 2 capsules (300 mg total) by mouth daily. 01/01/20  Yes Tsosie Billing, MD  SPIRIVA RESPIMAT 2.5 MCG/ACT AERS Inhale 2 puffs into the lungs daily.  10/17/19  Yes [provider]  vitamin B-12 (CYANOCOBALAMIN) 1000 MCG tablet Take 1,000 mcg by mouth daily.   Yes [provider]    Allergies: Allergies  Allergen Reactions  . Keppra [Levetiracetam] Other (See Comments)    Nausea and dizzy  . Lyrica [Pregabalin] Other (See Comments)    Made patient feel very dizzy and not feel good.     Review of Systems: Review of Systems  Constitutional: Negative for chills and fever.  HENT: Negative for congestion and sore throat.   Respiratory: Positive for cough and shortness of breath.   Cardiovascular: Positive for chest pain (MSK - Right Lower ribs). Negative for palpitations.  Gastrointestinal: Negative for abdominal pain, diarrhea, nausea and vomiting.  Musculoskeletal: Positive for falls. Negative for joint pain and neck pain.  All other systems reviewed and are negative.   Physical Exam BP 104/67   Pulse 98   Temp 97.7 F (36.5 C) (Oral)   Resp 18   Ht _0  (1.549 m)   Wt 76 lb (34.5 kg)   SpO2 (!) 88%   BMI 14.36 kg/m    Physical Exam Vitals and nursing note reviewed. Exam conducted with a chaperone present.  Constitutional:      General: She is awake. She is not in acute distress.    Appearance: Normal appearance. She is cachectic. She is not ill-appearing.     Comments: Very frail, cachetic appearing female, no acute distress  HENT:     Head: Normocephalic and atraumatic.  Eyes:     Conjunctiva/sclera: Conjunctivae normal.     Pupils: Pupils are equal, round, and reactive to light.  Cardiovascular:     Rate and Rhythm: Normal rate and regular rhythm.     Pulses: Normal pulses.     Heart sounds: No murmur heard.   Pulmonary:     Effort: Pulmonary effort is normal. No respiratory distress.     Breath sounds: Rhonchi (RLL) present.     Comments: Some rhonchorous breath sounds to the RLF Chest:     Chest wall: No deformity, swelling or tenderness.       Comments: I do not appreciate any gross tenderness or deformity to her right rib cage, no evidence of fracture by examination  Abdominal:     General: Abdomen is flat. There is no  distension.     Palpations: Abdomen is soft.     Tenderness: There is no abdominal tenderness. There is no guarding or rebound.  Genitourinary:    Comments: Deferred Musculoskeletal:        General: Normal range of motion.     Cervical back: Normal range of motion and neck supple. No tenderness.     Right lower leg: No edema.     Left lower leg: Edema (2+ pitting edema to the LLE, just above the ankle, improved from prior examination ) present.  Skin:    General: Skin is warm and dry.     Coloration: Skin is not pale.     Findings: No bruising or erythema.  Neurological:     General: No focal deficit present.     Mental Status: She is alert and oriented to person, place, and time. Mental status is at baseline.  Psychiatric:        Mood and Affect: Mood normal.        Behavior: Behavior normal. Behavior is cooperative.      Labs/Imaging:  CXR (05/04/2020)  personally reviewed showing stable positioning of right chest tube, no obvious pneumothorax, hemothorax, or fracture, emphysematous changes to the right lung but overall appears stable in appearance compared to 07/02, and radiologist pending   Assessment and Plan: This is a 64 y.o. female with history of right lung abscess/empyema s/p percutaneous chest tube placement on 06/28 who sustained a mechanical fall at home on 08/01 with SOB, which I suspect is secondary to pain as she is without pneumothorax, hemothorax, or gross traumatic injury     - I will send in prescription for Tramadol for pain control to be taking along with OTC Tylenol. She reports this has worked well in the past  - I encouraged her to continue to use incentive spirometer at home and continue with forceful coughing as she has been  - There is no longer any evidence of bleeding, and as such I do not feel we need to stop any anticoagulation at this time.  - I discussed with her, and her daughter, return precautions which included any worsening SOB, worsening chest pain, fever, chills, or gross bloody drainage from chest tube that does not stop. They verbalized understanding of these.   - She will follow up on Friday 08/06 with Dr Genevive Bi as scheduled. As a precaution, I will get repeat CXR prior to the appointment to ensure nothing has changed in the interim.   Face-to-face time spent with the patient and care providers was 25 minutes, with more than 50% of the time spent counseling, educating, and coordinating care of the patient.     Edison Simon, PA-C [facility] 05/04/2020, 12:40 PM [phone] M-F: 7am - 4pm

## 2020-05-05 ENCOUNTER — Other Ambulatory Visit: Payer: Self-pay | Admitting: Internal Medicine

## 2020-05-05 DIAGNOSIS — C3411 Malignant neoplasm of upper lobe, right bronchus or lung: Secondary | ICD-10-CM

## 2020-05-05 DIAGNOSIS — R634 Abnormal weight loss: Secondary | ICD-10-CM

## 2020-05-05 LAB — THYROID PANEL WITH TSH
Free Thyroxine Index: 2.2 (ref 1.2–4.9)
T3 Uptake Ratio: 34 % (ref 24–39)
T4, Total: 6.4 ug/dL (ref 4.5–12.0)
TSH: 1.18 u[IU]/mL (ref 0.450–4.500)

## 2020-05-05 LAB — ASPERGILLUS ANTIGEN, BAL/SERUM: Aspergillus Ag, BAL/Serum: 0.14 Index (ref 0.00–0.49)

## 2020-05-05 MED ORDER — DRONABINOL 5 MG PO CAPS
5.0000 mg | ORAL_CAPSULE | Freq: Two times a day (BID) | ORAL | 3 refills | Status: DC
Start: 2020-05-05 — End: 2020-05-11

## 2020-05-05 NOTE — Progress Notes (Signed)
On 8/04-discussed with ID; recommend Marinol.  Also evaluation with [name] ASAP [patient currently weighs 76 pounds];   Spoke to patient-recommend appetite stimulant.  Phone disconnected/poor connection.  Will discuss again tomorrow.   Heather/taylor- referal to [employer].

## 2020-05-06 ENCOUNTER — Telehealth: Payer: Self-pay | Admitting: Internal Medicine

## 2020-05-06 ENCOUNTER — Telehealth: Payer: Self-pay

## 2020-05-06 LAB — FUNGITELL, SERUM: Fungitell Result: 100 pg/mL — ABNORMAL HIGH (ref <80)

## 2020-05-06 NOTE — Telephone Encounter (Signed)
Received epic secure message from Dr. Mortimer Fries requesting that pt be scheduled for SMW.  SMW has been scheduled for 05/13/2020 at 2:00. Pt is aware and voiced her understanding. Nothing further is needed.

## 2020-05-06 NOTE — Telephone Encounter (Signed)
Discussed with patient the importance of nutrition; and concern for weight loss-likely from underlying infection.  Called in a prescription of Marinol.  If expensive/if significant out-of-pocket expenses asked the patient to contact us for possible assistance.

## 2020-05-06 NOTE — Progress Notes (Signed)
Colette - Please schedule patient for Dietary referral.

## 2020-05-06 NOTE — Addendum Note (Signed)
Addended by: Gloris Ham on: 05/06/2020 08:32 AM   Modules accepted: Orders

## 2020-05-07 ENCOUNTER — Other Ambulatory Visit: Payer: Self-pay | Admitting: *Deleted

## 2020-05-07 ENCOUNTER — Ambulatory Visit
Admission: RE | Admit: 2020-05-07 | Discharge: 2020-05-07 | Disposition: A | Discharge status: Home / Self Care | Payer: PPO | Attending: Cardiothoracic Surgery | Admitting: Cardiothoracic Surgery

## 2020-05-07 ENCOUNTER — Other Ambulatory Visit: Payer: Self-pay

## 2020-05-07 ENCOUNTER — Encounter: Payer: Self-pay | Admitting: Cardiothoracic Surgery

## 2020-05-07 ENCOUNTER — Ambulatory Visit (INDEPENDENT_AMBULATORY_CARE_PROVIDER_SITE_OTHER): Payer: PPO | Admitting: Cardiothoracic Surgery

## 2020-05-07 ENCOUNTER — Ambulatory Visit
Admission: RE | Admit: 2020-05-07 | Discharge: 2020-05-07 | Disposition: A | Discharge status: Home / Self Care | Payer: PPO | Source: Ambulatory Visit | Attending: Cardiothoracic Surgery | Admitting: Cardiothoracic Surgery

## 2020-05-07 VITALS — BP 112/76 | HR 92 | Temp 98.1°F | Ht 61.0 in | Wt 75.4 lb

## 2020-05-07 DIAGNOSIS — J869 Pyothorax without fistula: Secondary | ICD-10-CM | POA: Diagnosis not present

## 2020-05-07 DIAGNOSIS — J181 Lobar pneumonia, unspecified organism: Secondary | ICD-10-CM | POA: Diagnosis not present

## 2020-05-07 DIAGNOSIS — J439 Emphysema, unspecified: Secondary | ICD-10-CM | POA: Diagnosis not present

## 2020-05-07 DIAGNOSIS — R918 Other nonspecific abnormal finding of lung field: Secondary | ICD-10-CM | POA: Diagnosis not present

## 2020-05-07 LAB — ASPERGILLUS ANTIBODY BY IMMUNODIFF
Aspergillus flavus: 1:4 {titer} — ABNORMAL HIGH
Aspergillus fumigatus, IgG: 1:2 {titer} — ABNORMAL HIGH
Aspergillus niger: NEGATIVE

## 2020-05-07 NOTE — Patient Instructions (Addendum)
Dr.Oaks discussed with patient that he would like to change BARD urinary catheter at next appointment.  Patient will follow up with Dr.Oaks and Otho Ket, PA to change tube at next appointment 05/11/20.  Dr.Oaks recommends patient to increase appetite to help with weight gain.

## 2020-05-07 NOTE — Telephone Encounter (Signed)
PA was submitted for Marinol

## 2020-05-07 NOTE — Progress Notes (Signed)
  Seen in followup for lung abscess and percutaneous drainage.  Her Foley bag is leaking fluid.  Has a hole in it somewhere.  Still with air leak  Lungs are markedly diminished right and left Heart regular  Purulent material still draining from tube  CXRay today looks OK.  She will come back next week when supplies are available to change out her system.  Discussed options for increasing calorie intake.  She will start on Marinol next week when available from the pharmacy.

## 2020-05-07 NOTE — Telephone Encounter (Signed)
Claudia Chavez Key: [name] - PA Case ID: [id number] - Rx #: [id number] dronabinol (MARINOL) 5 MG capsule PA submitted.

## 2020-05-11 ENCOUNTER — Encounter: Payer: Self-pay | Admitting: Physician Assistant

## 2020-05-11 ENCOUNTER — Ambulatory Visit (INDEPENDENT_AMBULATORY_CARE_PROVIDER_SITE_OTHER): Payer: Self-pay | Admitting: Physician Assistant

## 2020-05-11 ENCOUNTER — Other Ambulatory Visit: Payer: Self-pay

## 2020-05-11 ENCOUNTER — Encounter: Payer: Self-pay | Admitting: *Deleted

## 2020-05-11 VITALS — BP 100/65 | HR 86 | Temp 98.3°F | Resp 14

## 2020-05-11 DIAGNOSIS — J869 Pyothorax without fistula: Secondary | ICD-10-CM

## 2020-05-11 DIAGNOSIS — Z09 Encounter for follow-up examination after completed treatment for conditions other than malignant neoplasm: Secondary | ICD-10-CM

## 2020-05-11 MED ORDER — DRONABINOL 5 MG PO CAPS: 5.0000 mg | ORAL_CAPSULE | Freq: Two times a day (BID) | ORAL | 3 refills | Status: DC

## 2020-05-11 NOTE — Progress Notes (Signed)
Progress Note Patient presents to my clinic this morning for exchange of left pleural drainage system. Continues to leak.   No other issues. Chronic non-productive cough.   Plan: -- I exchanged external tubing and replaced foley bag. No issues -- She is working with PCP on approval of Marinol to aid in weight gain -- She with RTC on an as needed basis for now  -- Edison Simon, PA-C [facility] 05/11/2020, 11:03 AM [phone] M-F: 7am - 4pm

## 2020-05-11 NOTE — Patient Instructions (Signed)
Zach changed your bag today. Please call the office if you have any questions or concerns.

## 2020-05-11 NOTE — Telephone Encounter (Signed)
Prescription for Marinol Denied. I will send a mychart msg to the patient to let her know that this was not approved.

## 2020-05-11 NOTE — Telephone Encounter (Signed)
Patient would like to proceed to out of pocket cost with Elvina Sidle. She stated that she is willing to pay the $70 to help with her concerns with weight loss. She is hoping this may by her some time until we can get this authorized/appealed by her insurance.  Dr. B could you send this pended script to [facility].

## 2020-05-11 NOTE — Addendum Note (Signed)
Addended by: Gloris Ham on: 05/11/2020 02:20 PM   Modules accepted: Orders

## 2020-05-11 NOTE — Telephone Encounter (Signed)
Heather-can we appeal? Or can the pharmacy team help the pt? Judy/alyson- any thoughts GB

## 2020-05-12 MED FILL — DRONABINOL 5 MG CAPSULE: 5 | 30 days supply | Qty: 60 | Fill #0

## 2020-05-13 ENCOUNTER — Telehealth: Payer: Self-pay

## 2020-05-13 ENCOUNTER — Ambulatory Visit (INDEPENDENT_AMBULATORY_CARE_PROVIDER_SITE_OTHER): Payer: PPO

## 2020-05-13 ENCOUNTER — Other Ambulatory Visit: Payer: Self-pay

## 2020-05-13 DIAGNOSIS — R0609 Other forms of dyspnea: Secondary | ICD-10-CM

## 2020-05-13 DIAGNOSIS — R0600 Dyspnea, unspecified: Secondary | ICD-10-CM

## 2020-05-13 NOTE — Telephone Encounter (Signed)
Patient presented to office for six minute walk test.  Test was paused three times due to sob and dizziness- spo2 maintained between 98-100% and HR 116-123. Test stopped at 3:58, as pt was unable to resume due to sob and dizziness. Noted symptoms subsided after 7min of resting.   Will route to Dr. Mortimer Fries to make aware of results.

## 2020-05-17 ENCOUNTER — Inpatient Hospital Stay: Payer: PPO | Attending: Internal Medicine

## 2020-05-17 ENCOUNTER — Telehealth: Payer: Self-pay | Admitting: *Deleted

## 2020-05-17 NOTE — Telephone Encounter (Signed)
-----   Message from Jennet Maduro, [place] sent at 05/17/2020  3:42 PM EDT ----- Dr. Jacinto Reap  I spoke with Claudia Chavez via phone this afternoon.  She started the [facility] on Saturday (8/14). Took 1 dose at 11 am then around 12:15 started feeding dizzy, head spinning.  Took another dose at 6pm and felt the same way.  Says that she did not sleep well Saturday night had "creepy dreams". Sunday she woke up and felt hungover.  She has not taken any more marinol since Saturday night. She wanted me to share this information with you.   We discussed ways to add calories and protein.  Her extreme shortness of breath is effecting her ability to prepare foods and consume foods.  We did discuss feeding tube.  She is undecided about this at this time.    I will follow-up with her on Sept 13th.     [name]

## 2020-05-17 NOTE — Telephone Encounter (Signed)
Spoke with patient. She experienced dizziness, head spinning and bad dreams. She stopped the Marinol on Saturday.  Advised patient not to take any more dosing of Marinol. Medication listed as an intolerance.

## 2020-05-17 NOTE — Telephone Encounter (Signed)
Thank you :)

## 2020-05-17 NOTE — Progress Notes (Signed)
Nutrition Follow-up:  Patient with lung cancer and persistent MAC infection.  Currently not on treatment for lung cancer.    Spoke with patient today via phone.  Patient reports that she started taking marinol on Saturday. Took first dose around 11 am and at 12:15 started feeling dizzy, head was spinning and staggering.  Took another dose around 6pm with same symptoms.  Did not sleep well Saturday night, had "creepy dreams."  Reports that on Sunday she woke up and felt hungover all day.  She has not taken any more medications.  Does feel like her appetite was increased.  Today has eaten 2 pop tarts, rice, broccoli and cheese, yogurt, fairlife shake (150 calories and 30 g protein) and 2 pieces of cake.  Reports shortness of breath.  Lives by herself, sister buys groceries for her and neighbor brings her food 1-2 times per week.    Medications: reviewed  Labs: reviewed  Anthropometrics:   Weight 75 lb on 8/6 decreased from 84 lb on 7/8.     Estimated Energy Needs  Kcals: 1500-1700 Protein: 75-85 g Fluid: > 1.5 L  NUTRITION DIAGNOSIS: Inadequate oral intake continues   INTERVENTION:  Reviewed higher calorie shake and patient to substitute boost plus shake for fairlife shake due to more calories.   Encouraged eating q 1-2 hours, mini snacks Discussed easy to prepare meals at home. Patient's extreme shortness of breath impacting ability to prepare meals at home.  Will mail handout on increasing calories and protein. Discussed feeding tube with patient.  Patient reports that this has been discussed with her.  Briefly described tube, ways to give nutrition.  Questions answered.  Patient still not sure if she wants to pursue this or not.  RD can meet with patient and show sample tube if patient interested.  Patient will let RD know.  Contact information will be mailed. Message sent to Dr. Jacinto Reap and team regarding side effects of marinol patient experienced.     MONITORING, EVALUATION, GOAL:  weight trends, intake   NEXT VISIT: Sept 13 phone f/u  Claudia Chavez, [place], [place] Registered Dietitian [phone] (mobile)

## 2020-05-17 NOTE — Telephone Encounter (Signed)
Noted.  Will close encounter.  

## 2020-05-19 ENCOUNTER — Telehealth: Payer: Self-pay | Admitting: Internal Medicine

## 2020-05-19 MED ORDER — MIRTAZAPINE 7.5 MG PO TABS
7.5000 mg | ORAL_TABLET | Freq: Every day | ORAL | 3 refills | Status: DC
Start: 2020-05-19 — End: 2020-07-08

## 2020-05-19 MED ORDER — HYDROCOD POLST-CPM POLST ER 10-8 MG/5ML PO SUER: 5.0000 mL | Freq: Every evening | ORAL | 0 refills | Status: DC | PRN

## 2020-05-19 MED ORDER — BENZONATATE 100 MG PO CAPS: 100.0000 mg | ORAL_CAPSULE | Freq: Four times a day (QID) | ORAL | 3 refills | Status: DC | PRN

## 2020-05-19 NOTE — Telephone Encounter (Signed)
Spoke to pt re: poor tolerance to [employer] [dizziness]. Reluctant to try lower dose. Will start remeron [apetite/sleep/depression/weight gain]. Refill tussionex; and tessalon pearls.

## 2020-06-08 ENCOUNTER — Ambulatory Visit: Payer: PPO | Admitting: Infectious Diseases

## 2020-06-09 ENCOUNTER — Inpatient Hospital Stay: Payer: PPO | Attending: Internal Medicine

## 2020-06-09 ENCOUNTER — Encounter: Payer: Self-pay | Admitting: Internal Medicine

## 2020-06-09 ENCOUNTER — Other Ambulatory Visit: Payer: Self-pay

## 2020-06-09 ENCOUNTER — Inpatient Hospital Stay (HOSPITAL_BASED_OUTPATIENT_CLINIC_OR_DEPARTMENT_OTHER): Payer: PPO | Admitting: Internal Medicine

## 2020-06-09 DIAGNOSIS — C3492 Malignant neoplasm of unspecified part of left bronchus or lung: Secondary | ICD-10-CM | POA: Insufficient documentation

## 2020-06-09 DIAGNOSIS — Z87891 Personal history of nicotine dependence: Secondary | ICD-10-CM | POA: Insufficient documentation

## 2020-06-09 DIAGNOSIS — C3411 Malignant neoplasm of upper lobe, right bronchus or lung: Secondary | ICD-10-CM | POA: Diagnosis not present

## 2020-06-09 DIAGNOSIS — D649 Anemia, unspecified: Secondary | ICD-10-CM | POA: Diagnosis not present

## 2020-06-09 DIAGNOSIS — Z7901 Long term (current) use of anticoagulants: Secondary | ICD-10-CM | POA: Diagnosis not present

## 2020-06-09 DIAGNOSIS — I82403 Acute embolism and thrombosis of unspecified deep veins of lower extremity, bilateral: Secondary | ICD-10-CM | POA: Insufficient documentation

## 2020-06-09 LAB — CBC WITH DIFFERENTIAL/PLATELET
Abs Immature Granulocytes: 0.05 10*3/uL (ref 0.00–0.07)
Basophils Absolute: 0.1 10*3/uL (ref 0.0–0.1)
Basophils Relative: 1 %
Eosinophils Absolute: 0.1 10*3/uL (ref 0.0–0.5)
Eosinophils Relative: 1 %
HCT: 24.3 % — ABNORMAL LOW (ref 36.0–46.0)
Hemoglobin: 7.3 g/dL — ABNORMAL LOW (ref 12.0–15.0)
Immature Granulocytes: 0 %
Lymphocytes Relative: 9 %
Lymphs Abs: 1 10*3/uL (ref 0.7–4.0)
MCH: 23.2 pg — ABNORMAL LOW (ref 26.0–34.0)
MCHC: 30 g/dL (ref 30.0–36.0)
MCV: 77.4 fL — ABNORMAL LOW (ref 80.0–100.0)
Monocytes Absolute: 0.7 10*3/uL (ref 0.1–1.0)
Monocytes Relative: 6 %
Neutro Abs: 9.2 10*3/uL — ABNORMAL HIGH (ref 1.7–7.7)
Neutrophils Relative %: 83 %
Platelets: 696 10*3/uL — ABNORMAL HIGH (ref 150–400)
RBC: 3.14 MIL/uL — ABNORMAL LOW (ref 3.87–5.11)
RDW: 16.8 % — ABNORMAL HIGH (ref 11.5–15.5)
WBC: 11.2 10*3/uL — ABNORMAL HIGH (ref 4.0–10.5)
nRBC: 0 % (ref 0.0–0.2)

## 2020-06-09 LAB — COMPREHENSIVE METABOLIC PANEL
ALT: 14 U/L (ref 0–44)
AST: 19 U/L (ref 15–41)
Albumin: 2.5 g/dL — ABNORMAL LOW (ref 3.5–5.0)
Alkaline Phosphatase: 143 U/L — ABNORMAL HIGH (ref 38–126)
Anion gap: 10 (ref 5–15)
BUN: 20 mg/dL (ref 8–23)
CO2: 26 mmol/L (ref 22–32)
Calcium: 8.4 mg/dL — ABNORMAL LOW (ref 8.9–10.3)
Chloride: 97 mmol/L — ABNORMAL LOW (ref 98–111)
Creatinine, Ser: 0.66 mg/dL (ref 0.44–1.00)
GFR calc Af Amer: >60 mL/min (ref >60)
GFR calc non Af Amer: >60 mL/min (ref >60)
Glucose, Bld: 104 mg/dL — ABNORMAL HIGH (ref 70–99)
Potassium: 4.2 mmol/L (ref 3.5–5.1)
Sodium: 133 mmol/L — ABNORMAL LOW (ref 135–145)
Total Bilirubin: 0.3 mg/dL (ref 0.3–1.2)
Total Protein: 8.1 g/dL (ref 6.5–8.1)

## 2020-06-09 MED ORDER — SODIUM CHLORIDE 0.9% FLUSH
10.0000 mL | Freq: Once | INTRAVENOUS | Status: AC
  Administered 2020-06-09: 10 mL via INTRAVENOUS
  Filled 2020-06-09: qty 10

## 2020-06-09 MED ORDER — HEPARIN SOD (PORK) LOCK FLUSH 100 UNIT/ML IV SOLN
INTRAVENOUS | Status: AC
  Filled 2020-06-09: qty 5

## 2020-06-09 MED ORDER — TRAMADOL HCL 50 MG PO TABS: 50.0000 mg | ORAL_TABLET | Freq: Two times a day (BID) | ORAL | 0 refills | Status: DC | PRN

## 2020-06-09 MED ORDER — HEPARIN SOD (PORK) LOCK FLUSH 100 UNIT/ML IV SOLN
500.0000 [IU] | Freq: Once | INTRAVENOUS | Status: AC
  Administered 2020-06-09: 500 [IU] via INTRAVENOUS
  Filled 2020-06-09: qty 5

## 2020-06-09 MED ORDER — ONDANSETRON HCL 4 MG PO TABS: 4.0000 mg | ORAL_TABLET | Freq: Three times a day (TID) | ORAL | 3 refills | Status: DC | PRN

## 2020-06-09 NOTE — Progress Notes (Signed)
Claudia Chavez OFFICE PROGRESS NOTE  Patient Care Team: Maryland Pink, MD as PCP - General (Family Medicine) Cammie Sickle, MD as Medical Oncologist (Medical Oncology) Rockey Situ, Kathlene November, MD as Consulting Physician (Cardiology) Efrain Sella, MD as Consulting Physician (Gastroenterology) Noreene Filbert, MD as Referring Physician (Radiation Oncology)  Cancer Staging Malignant neoplasm of lung [facility]) Staging form: Lung, AJCC 7th Edition - Clinical: Stage IIIA (T3, N2, M0) - Signed by Forest Gleason, MD on 05/07/2015    Oncology History Overview Note  # Squamous cell carcinoma of right  LOWER LOBE OF  lung stage IIIA based on PET scan Biopsy of the (August of 2016). 2.  After 3 cycles of chemotherapy patient underwent resection of lung mass (November, 2016) ypT2B   ypN0 [Dr.Oaks]  status post right lower lobectomy with a 3. She  was started on carboplatinum and Taxol on a weekly basis and radiation therapy from January of 2017 4. After 2 cycles of carboplatinum patient had neuropathy grade 2 interfering with work so chemotherapy was put on hold and radiation was continued (January 19th, 2017) 5.  Chemotherapy was discontinued because of progressive neuropathy.  Patient is finishing of radiation therapy on November 19, 2015  # NOV 2017- CT- 19m right hilar LN; PET- Feb 2018- NED.   # DEC-JAN 2019- RUL Lung nodule [s/p Bronch; Dr.Kasa- non-diagnostic] SBRT Feb 2019  #October 10, 2018 bronchoscopy [Dr.Kasa]-negative for malignancy/positive for MAC infection-ethambutol [Dr.Ravishankar]; [facility]- R LE DVT [xarelto]x STOP in end of dec 2019. Syncope- MRI- 13 mm cystic leision/asymptomatic/[facility]; Bil subacute cerebellar stroke;  AUG 2019 - GIB/anemia [EGD/colo- Dr.Toledo; NEG]; Aug 2019-  Severe hypokalemia- sec to Florinef-resolved.   DIAGNOSIS: RLL stage III lung ca; RUL stage I   GOALS: curative  CURRENT/MOST RECENT THERAPY :  Surveillaince    Malignant neoplasm of lung (HCC)  Malignant neoplasm of right upper lobe of lung (HCC)      INTERVAL HISTORY:  Claudia [id number].[id number].  female pleasant patient above history of stage III lung cancer; and also stage I right upper lobe status post radiation February 2019; history of MAC infection is here for follow-up.  Patient in the interim underwent drain placement of the right upper lobe lung abscess.  Concerns for Aspergillus infection.  Patient poorly tolerated amikacin/second line therapy for MAC.  She continues to feel poorly.  Continues to complain of cough.  Continued weight loss.   Review of Systems  Constitutional: Positive for malaise/fatigue and weight loss. Negative for chills, diaphoresis and fever.  HENT: Negative for nosebleeds and sore throat.   Eyes: Negative for double vision.  Respiratory: Positive for cough, sputum production and shortness of breath. Negative for hemoptysis and wheezing.   Cardiovascular: Negative for chest pain, palpitations, orthopnea and leg swelling.  Gastrointestinal: Negative for abdominal pain, blood in stool, constipation, diarrhea, heartburn, melena, nausea and vomiting.  Genitourinary: Negative for dysuria, frequency and urgency.  Musculoskeletal: Positive for back pain and joint pain.  Skin: Negative.  Negative for itching and rash.  Neurological: Negative for tingling, focal weakness, weakness and headaches.  Endo/Heme/Allergies: Does not bruise/bleed easily.  Psychiatric/Behavioral: Negative for depression. The patient is not nervous/anxious and does not have insomnia.       PAST MEDICAL HISTORY :  Past Medical History:  Diagnosis Date  . [place]  . COPD (chronic obstructive pulmonary disease) ([place]   . Dyspnea   .  Non-small cell lung cancer ([place])   . Pneumonia 04/2015  . Pulmonary Mycobacterium avium complex (MAC) infection ([place]) 10/25/2018    PAST SURGICAL HISTORY  :   Past Surgical History:  Procedure Laterality Date  . ABDOMINAL HYSTERECTOMY    . COLONOSCOPY WITH PROPOFOL N/A 06/09/2018   Procedure: COLONOSCOPY WITH PROPOFOL;  Surgeon: Toledo, Benay Pike, MD;  Location: [facility] ENDOSCOPY;  Service: Gastroenterology;  Laterality: N/A;  . ELBOW SURGERY Right 1995  . ELECTROMAGNETIC NAVIGATION BROCHOSCOPY N/A 10/25/2017   Procedure: ELECTROMAGNETIC NAVIGATION BRONCHOSCOPY;  Surgeon: [name], MD;  Location: [facility] ORS;  Service: Cardiopulmonary;  Laterality: N/A;  . ESOPHAGOGASTRODUODENOSCOPY N/A 06/08/2018   Procedure: ESOPHAGOGASTRODUODENOSCOPY (EGD);  Surgeon: Toledo, Benay Pike, MD;  Location: [facility] ENDOSCOPY;  Service: Gastroenterology;  Laterality: N/A;  . FLEXIBLE BRONCHOSCOPY Right 10/10/2018   Procedure: FLEXIBLE BRONCHOSCOPY;  Surgeon: [name], MD;  Location: [facility] ORS;  Service: Cardiopulmonary;  Laterality: Right;  . PORTACATH PLACEMENT Right 05/10/2015   Procedure: INSERTION PORT-A-CATH;  Surgeon: Nestor Lewandowsky, MD;  Location: [facility] ORS;  Service: General;  Laterality: Right;  Marland Kitchen VIDEO ASSISTED THORACOSCOPY (VATS)/THOROCOTOMY Right 08/18/2015   Procedure: VIDEO ASSISTED THORACOSCOPY (VATS)/THOROCOTOMY;  Surgeon: Nestor Lewandowsky, MD;  Location: [facility] ORS;  Service: General;  Laterality: Right;    FAMILY HISTORY :   Family History  Problem Relation Age of Onset  . Stroke Mother   . Lung cancer Father 66    SOCIAL HISTORY:   Social History   Tobacco Use  . Smoking status: Former Smoker    Packs/day: 0.50    Years: 40.00    Pack years: 20.00    Types: Cigarettes    Quit date: 12/01/2019    Years since quitting: 0.5  . Smokeless tobacco: Never Used  Vaping Use  . Vaping Use: Never used  Substance Use Topics  . Alcohol use: Not Currently    Alcohol/week: 2.0 - 4.0 standard drinks    Types: 2 - 4 Cans of beer per week    Comment: beer-occasionally  . Drug use: No    ALLERGIES:  is allergic to keppra [levetiracetam], marinol [dronabinol], and  lyrica [pregabalin].  MEDICATIONS:  Current Outpatient Medications  Medication Sig Dispense Refill  . albuterol (PROVENTIL HFA;VENTOLIN HFA) 108 (90 Base) MCG/ACT inhaler Inhale 2 puffs into the lungs every 6 (six) hours as needed for wheezing or shortness of breath.     . benzonatate (TESSALON) 100 MG capsule Take 1 capsule (100 mg total) by mouth 4 (four) times daily as needed for cough. 90 capsule 3  . chlorpheniramine-HYDROcodone (TUSSIONEX) 10-8 MG/5ML SUER Take 5 mLs by mouth at bedtime as needed for cough. 140 mL 0  . Cholecalciferol (VITAMIN D3) 1000 units CAPS Take 2,000 Units by mouth daily.     . Fluticasone-Salmeterol (ADVAIR DISKUS) 250-50 MCG/DOSE AEPB Inhale 1 puff into the lungs every 12 (twelve) hours.     Marland Kitchen ipratropium-albuterol (DUONEB) 0.5-2.5 (3) MG/3ML SOLN Take 3 mLs by nebulization every 4 (four) hours as needed. 360 mL 3  . mirtazapine (REMERON) 7.5 MG tablet Take 1 tablet (7.5 mg total) by mouth at bedtime. 30 tablet 3  . Respiratory Therapy Supplies (FLUTTER) DEVI 1 each by Does not apply route daily. 1 each 0  . SPIRIVA RESPIMAT 2.5 MCG/ACT AERS Inhale 2 puffs into the lungs daily.     . traMADol (ULTRAM) 50 MG tablet Take 1 tablet (50 mg total) by mouth every 12 (twelve) hours as needed for moderate pain or severe pain. 60 tablet  0  . vitamin B-12 (CYANOCOBALAMIN) 1000 MCG tablet Take 1,000 mcg by mouth daily.    Marland Kitchen apixaban (ELIQUIS) 2.5 MG TABS tablet Take 1 tablet (2.5 mg total) by mouth 2 (two) times daily. 60 tablet 2  . ondansetron (ZOFRAN) 4 MG tablet Take 1 tablet (4 mg total) by mouth every 8 (eight) hours as needed for nausea or vomiting. 45 tablet 3  . voriconazole (VFEND) 50 MG tablet Take 2 tablets (100 mg total) by mouth 2 (two) times daily. 120 tablet 0   No current facility-administered medications for this visit.   Facility-Administered Medications Ordered in Other Visits  Medication Dose Route Frequency Provider Last Rate Last Admin  . sodium  chloride flush (NS) 0.9 % injection 10 mL  10 mL Intravenous PRN Cammie Sickle, MD        PHYSICAL EXAMINATION: ECOG PERFORMANCE STATUS: 1 - Symptomatic but completely ambulatory  BP 106/66 (BP Location: Left Arm, Patient Position: Sitting, Cuff Size: Small)   Pulse 95   Temp (!) 97.5 F (36.4 C) (Tympanic)   Resp 18   Ht _0  (1.549 m)   Wt 75 lb (34 kg)   SpO2 100%   BMI 14.17 kg/m   Filed Weights   06/09/20 1042  Weight: 75 lb (34 kg)    Physical Exam Constitutional:      Comments: Thin built Caucasian female patient.  She is alone.   HENT:     Head: Normocephalic and atraumatic.     Mouth/Throat:     Pharynx: No oropharyngeal exudate.  Eyes:     Pupils: Pupils are equal, round, and reactive to light.  Cardiovascular:     Rate and Rhythm: Normal rate and regular rhythm.  Pulmonary:     Effort: No respiratory distress.     Breath sounds: No wheezing.  Abdominal:     General: Bowel sounds are normal. There is no distension.     Palpations: Abdomen is soft. There is no mass.     Tenderness: There is no abdominal tenderness. There is no guarding or rebound.  Musculoskeletal:        General: No tenderness. Normal range of motion.     Cervical back: Normal range of motion and neck supple.  Skin:    General: Skin is warm.  Neurological:     Mental Status: She is alert and oriented to person, place, and time.  Psychiatric:        Mood and Affect: Affect normal.    LABORATORY DATA:  I have reviewed the data as listed    Component Value Date/Time   NA 133 (L) 06/09/2020 1025   NA 137 07/30/2018 1245   K 4.2 06/09/2020 1025   CL 97 (L) 06/09/2020 1025   CO2 26 06/09/2020 1025   GLUCOSE 104 (H) 06/09/2020 1025   BUN 20 06/09/2020 1025   BUN 11 07/30/2018 1245   CREATININE 0.66 06/09/2020 1025   CALCIUM 8.4 (L) 06/09/2020 1025   PROT 8.1 06/09/2020 1025   ALBUMIN 2.5 (L) 06/09/2020 1025   AST 19 06/09/2020 1025   ALT 14 06/09/2020 1025   ALKPHOS 143  (H) 06/09/2020 1025   BILITOT 0.3 06/09/2020 1025   GFRNONAA >60 06/09/2020 1025   GFRAA >60 06/09/2020 1025    No results found for: SPEP, UPEP  Lab Results  Component Value Date   WBC 11.2 (H) 06/09/2020   NEUTROABS 9.2 (H) 06/09/2020   HGB 7.3 (L) 06/09/2020   HCT 24.3 (  L) 06/09/2020   MCV 77.4 (L) 06/09/2020   PLT 696 (H) 06/09/2020      Chemistry      Component Value Date/Time   NA 133 (L) 06/09/2020 1025   NA 137 07/30/2018 1245   K 4.2 06/09/2020 1025   CL 97 (L) 06/09/2020 1025   CO2 26 06/09/2020 1025   BUN 20 06/09/2020 1025   BUN 11 07/30/2018 1245   CREATININE 0.66 06/09/2020 1025      Component Value Date/Time   CALCIUM 8.4 (L) 06/09/2020 1025   ALKPHOS 143 (H) 06/09/2020 1025   AST 19 06/09/2020 1025   ALT 14 06/09/2020 1025   BILITOT 0.3 06/09/2020 1025       RADIOGRAPHIC STUDIES: I have personally reviewed the radiological images as listed and agreed with the findings in the report. No results found.   ASSESSMENT & PLAN:  Malignant neoplasm of right upper lobe of lung [facility]) # Right upper lobe stage I lung cancer; s/p feb February 2019 SBRT-June 2021 CT scan [see below]-no obvious evidence of recurrence.  # Left lung stage III lung cancer-clinically  STABLE; See below  #Bilateral lower extremity DVT[April 03, 2020]-currently on Eliquis 5 mg twice daily; as per discussion with Dr. Earney Mallet prescription for 2.5 mg twice daily sent to patient [interaction with voriconazole.].  We will also repeat ultrasound prior to next visit.  #Infectious issues:  bilateral MAC infection [Dr. Levester Fresh, ID]; persistent MAC infection-s/p poor tolerance to second line therapy amikacin.  Right upper lobe cavitary lesion s/p drain placement.  Aspergillus-infection versus colonization-plan to start voriconazole as per ID.  #Anemia secondary to chronic inflammation; hemoglobin 7.8 proceed with PRBC transfusion next week.  # DISPOSITION: Add iron studies/ferritin-   # 1 unit PRBC transfusion next week [except Tuesday] # Follow up in 1 months- MD- cbc/Cmp/port flush; HOLD tube; possible 1 unit of PRBC;;Dr.B  Cc: Drs. Kasa/Ravikrishnan/ Crystal/ Hedrick.     Orders Placed This Encounter  Procedures  . CBC with Differential    Standing Status:   Future    Standing Expiration Date:   06/09/2021  . Comprehensive metabolic panel    Standing Status:   Future    Standing Expiration Date:   06/09/2021  . CBC with Differential    Standing Status:   Future    Standing Expiration Date:   06/09/2021  . Hold Tube- Blood Bank    Standing Status:   Future    Standing Expiration Date:   06/09/2021  . Hold Tube- Blood Bank    Standing Status:   Future    Standing Expiration Date:   06/09/2021   All questions were answered. The patient knows to call the clinic with any problems, questions or concerns.      Cammie Sickle, MD 06/15/2020 2:57 PM

## 2020-06-09 NOTE — Assessment & Plan Note (Addendum)
#   Right upper lobe stage I lung cancer; s/p feb February 2019 SBRT-June 2021 CT scan [see below]-no obvious evidence of recurrence.  # Left lung stage III lung cancer-clinically  STABLE; See below  #Bilateral lower extremity DVT[April 03, 2020]-currently on Eliquis 5 mg twice daily; as per discussion with Dr. Earney Mallet prescription for 2.5 mg twice daily sent to patient [interaction with voriconazole.].  We will also repeat ultrasound prior to next visit.  #Infectious issues:  bilateral MAC infection [Dr. Levester Fresh, ID]; persistent MAC infection-s/p poor tolerance to second line therapy amikacin.  Right upper lobe cavitary lesion s/p drain placement.  Aspergillus-infection versus colonization-plan to start voriconazole as per ID.  #Anemia secondary to chronic inflammation; hemoglobin 7.8 proceed with PRBC transfusion next week.  # DISPOSITION: Add iron studies/ferritin-  # 1 unit PRBC transfusion next week [except Tuesday] # Follow up in 1 months- MD- cbc/Cmp/port flush; HOLD tube; possible 1 unit of PRBC;;Dr.B  Cc: Drs. Kasa/Ravikrishnan/ Crystal/ Hedrick.

## 2020-06-09 NOTE — Patient Instructions (Signed)
Follow up with Dr.Ravishankar- on 8/14 [Tuesday] at 11:30.

## 2020-06-14 ENCOUNTER — Ambulatory Visit: Payer: PPO | Admitting: Pulmonary Disease

## 2020-06-14 ENCOUNTER — Telehealth: Payer: Self-pay

## 2020-06-14 ENCOUNTER — Other Ambulatory Visit: Payer: Self-pay

## 2020-06-14 ENCOUNTER — Inpatient Hospital Stay: Payer: PPO

## 2020-06-14 NOTE — Telephone Encounter (Signed)
Nutrition  Called several times today to reach patient by phone for nutrition follow-up. No answer or option to leave voicemail.   Rosena Bartle B. Zenia Resides, [place], [place] Registered Dietitian [phone] (mobile)

## 2020-06-15 ENCOUNTER — Encounter: Payer: Self-pay | Admitting: Infectious Diseases

## 2020-06-15 ENCOUNTER — Telehealth: Payer: Self-pay | Admitting: Internal Medicine

## 2020-06-15 ENCOUNTER — Ambulatory Visit: Payer: PPO | Attending: Infectious Diseases | Admitting: Infectious Diseases

## 2020-06-15 VITALS — BP 91/68 | HR 101 | Temp 97.9°F | Resp 16 | Ht 61.0 in | Wt 75.0 lb

## 2020-06-15 DIAGNOSIS — Z681 Body mass index (BMI) 19 or less, adult: Secondary | ICD-10-CM | POA: Diagnosis not present

## 2020-06-15 DIAGNOSIS — A310 Pulmonary mycobacterial infection: Secondary | ICD-10-CM | POA: Diagnosis not present

## 2020-06-15 DIAGNOSIS — B4489 Other forms of aspergillosis: Secondary | ICD-10-CM | POA: Diagnosis not present

## 2020-06-15 DIAGNOSIS — Z7901 Long term (current) use of anticoagulants: Secondary | ICD-10-CM | POA: Insufficient documentation

## 2020-06-15 DIAGNOSIS — Z79899 Other long term (current) drug therapy: Secondary | ICD-10-CM | POA: Diagnosis not present

## 2020-06-15 DIAGNOSIS — J449 Chronic obstructive pulmonary disease, unspecified: Secondary | ICD-10-CM | POA: Diagnosis not present

## 2020-06-15 DIAGNOSIS — Z85118 Personal history of other malignant neoplasm of bronchus and lung: Secondary | ICD-10-CM | POA: Diagnosis not present

## 2020-06-15 DIAGNOSIS — D649 Anemia, unspecified: Secondary | ICD-10-CM | POA: Insufficient documentation

## 2020-06-15 DIAGNOSIS — Z87891 Personal history of nicotine dependence: Secondary | ICD-10-CM | POA: Insufficient documentation

## 2020-06-15 DIAGNOSIS — R636 Underweight: Secondary | ICD-10-CM | POA: Insufficient documentation

## 2020-06-15 DIAGNOSIS — Z7951 Long term (current) use of inhaled steroids: Secondary | ICD-10-CM | POA: Insufficient documentation

## 2020-06-15 DIAGNOSIS — I82403 Acute embolism and thrombosis of unspecified deep veins of lower extremity, bilateral: Secondary | ICD-10-CM | POA: Insufficient documentation

## 2020-06-15 DIAGNOSIS — I824Z9 Acute embolism and thrombosis of unspecified deep veins of unspecified distal lower extremity: Secondary | ICD-10-CM

## 2020-06-15 MED ORDER — VORICONAZOLE 50 MG PO TABS: 100.0000 mg | ORAL_TABLET | Freq: Two times a day (BID) | ORAL | 0 refills | Status: DC

## 2020-06-15 MED ORDER — APIXABAN 2.5 MG PO TABS: 2.5000 mg | ORAL_TABLET | Freq: Two times a day (BID) | ORAL | 2 refills | Status: DC

## 2020-06-15 NOTE — Telephone Encounter (Signed)
Spoke to Dr. Steva Ready regarding Eliquis interaction with voriconazole.  Agree with reducing the dose of Eliquis to 2.5 mg.  Also will repeat Dopplers.  I will reach out to patient to discuss above.  We will send a prescription for Eliquis 2.5 twice daily; start the new dose as soon as available. GB

## 2020-06-15 NOTE — Progress Notes (Signed)
NAME: Claudia Chavez  DOB: [birth year]  MRN: [id number]  Date/Time: 06/15/2020 12:00 PM   Subjective:   Follow up visit  Here with sister in law Patient with history of MAC pulmonary infection who took treatment   with rifabutin, ethambutol and azithromycin since 11/11/2018.  Because of progression of the cavitary lesion amikacin infusion was started on 02/06/2020. Patient continued to have persisting cough and was also losing weight with poor appetite and had gone to see pulmonologist and a chest x-ray was done on 03/08/2020..  The chest x-ray showed thick-walled cavitary lesion in the right lung apex with a layering air-fluid level.  A CT scan was done on 03/18/2020 which showed a worsening cavity on the right lung..  I had seen patient on the same day and had stopped all her antimycobacterial treatment because of her losing weight of nearly 15 pounds in 2 months. She also was having poor energy then.  She saw Dr.oaks was admitted and underwent CT guided drainage of the loculated fluid in the rt lung on 03/29/20- cultures sent had aspergillus She is seeing me today to decide on treatment of the aspergillus   She also had b/l lower extremityDVT in July 2021 and was in the hospital. She is currently on eliquis. Her weight is stable at 75 pounds. She could not tolerate marinol appetite is fair Still has sob on walking and is afraid to walk for   Medical history She is a 64 year old female who has a  history of Lung cancer , COPD Patient was diagnosed non-small cell right lung cancer in July 2016 and underwent 2 cycles of chemotherapy with Taxol and carboplatin  .   She developed  neuropathy.  And chemo was stopped.  She had right lower lobe resection  in November 2016 at [facility] by Dr. Faith Rogue.  This was followed by radiation therapy to the right lung in February 2017 for 30 days.  In May 2017 a repeat CAT scan was noted as interval right lower lobe resection without evidence of local recurrence or definite  metastatic disease. IShe was followed by oncology with serial CAT scans and PET scans. PET scan Feb 9th 2018- no evidence of recurrence; postoperative changes/stable right hilar lymph node without any activity noted. On 05/25/17 CT showed partial cavitary right upper lobe pulmonary nodule, most consistent with metastatic disease versus metachronous primary.  She was seen by pulmonologist Dr.Casa and underwent bronch and biopsy on 10/25/17.  Pathology report was VERY SCANT LUNG TISSUE WITH PIGMENTED MACROPHAGES. - NO NEOPLASM IS IDENTIFIED IN THIS SPECIMEN She received  SBRT  to the right lung X 5 doses-   Subsequently she was complaining of cough and productive sputum and she was treated with multiple courses of antibiotics including Levaquin.  A repeat CT scan was done in  Jan 2020 and it showed Thick-walled, partially cavitary process in the right lung apex measuring 5.4 x 7.4 cm (series 3/image 34), progressive, previously 4.2 x 2.9 cm.  Additional thick-walled cavitary process posteriorly in the right upper lobe measuring 8.1 x 5.5 cm (series 3/image 61), previously 7.7 x 5.1 cm when measured in a similar fashion.      On 10/10/2018 she underwent bronchoscopy and bronchoalveolar lavage was sent for AFB and fungal and bacterial cultures.  The pathology showed BENIGN ALVEOLATED LUNG PARENCHYMA AND BRONCHIAL WALL WITH FOCAL  ORGANIZING FIBRIN. - NO EVIDENCE OF TUMOR OR GRANULOMA.    AFB and GMS special stains were negative. The AFB nucleic acid amplification  testing was done and that was positive for Mycobacterium avium intracellulare and she was referred to me in 11/12/18.Marland Kitchen Started Anti MAC treatment 11-12-2018. Repeat Sputum in Sept had MAC and it was still susceptible to macrolide In Nov 2020 she has stopped meds because of pill fatigue and restarted Dec 2020 CT scan done in March 2021     She was a smoker until December 2019.  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 Husband died in Nov 12, 2016 of lung cancer as well.  Past Medical History:  Diagnosis Date  . Cancer of lower lobe of right lung ([place]) 05/07/2015  . COPD (chronic obstructive pulmonary disease) ([place])   . Dyspnea   . Non-small cell lung cancer ([place])   . Pneumonia 04/2015  . Pulmonary Mycobacterium avium complex (MAC) infection ([place]) 10/25/2018    Past Surgical History:  Procedure Laterality Date  . ABDOMINAL HYSTERECTOMY    . COLONOSCOPY WITH PROPOFOL N/A 06/09/2018   Procedure: COLONOSCOPY WITH PROPOFOL;  Surgeon: Toledo, Benay Pike, MD;  Location: [facility] ENDOSCOPY;  Service: Gastroenterology;  Laterality: N/A;  . ELBOW SURGERY Right 1995  . ELECTROMAGNETIC NAVIGATION BROCHOSCOPY N/A 10/25/2017   Procedure: ELECTROMAGNETIC NAVIGATION BRONCHOSCOPY;  Surgeon: [name], MD;  Location: [facility] ORS;  Service: Cardiopulmonary;  Laterality: N/A;  . ESOPHAGOGASTRODUODENOSCOPY N/A 06/08/2018   Procedure: ESOPHAGOGASTRODUODENOSCOPY (EGD);  Surgeon: Toledo, Benay Pike, MD;  Location: [facility] ENDOSCOPY;  Service: Gastroenterology;  Laterality: N/A;  . FLEXIBLE BRONCHOSCOPY Right 10/10/2018   Procedure: FLEXIBLE BRONCHOSCOPY;  Surgeon: [name], MD;  Location: [facility] ORS;  Service: Cardiopulmonary;  Laterality: Right;  . PORTACATH PLACEMENT Right 05/10/2015   Procedure: INSERTION PORT-A-CATH;  Surgeon: Nestor Lewandowsky, MD;  Location: [facility] ORS;  Service: General;  Laterality: Right;  Marland Kitchen VIDEO ASSISTED THORACOSCOPY (VATS)/THOROCOTOMY Right 08/18/2015   Procedure: VIDEO ASSISTED THORACOSCOPY (VATS)/THOROCOTOMY;  Surgeon: Nestor Lewandowsky, MD;  Location: [facility] ORS;  Service: General;  Laterality: Right;    Social History   Socioeconomic History  . Marital status: Widowed    Spouse name: Not on file  . Number of children: Not on file  . Years of education: Not on file  . Highest education level: Not on file  Occupational History  . Not on file  Tobacco Use  . Smoking status: Former Smoker    Packs/day: 0.50     Years: 40.00    Pack years: 20.00    Types: Cigarettes    Quit date: 12/01/2019    Years since quitting: 0.5  . Smokeless tobacco: Never Used  Vaping Use  . Vaping Use: Never used  Substance and Sexual Activity  . Alcohol use: Not Currently    Alcohol/week: 2.0 - 4.0 standard drinks    Types: 2 - 4 Cans of beer per week    Comment: beer-occasionally  . Drug use: No  . Sexual activity: Never  Other Topics Concern  . Not on file  Social History Narrative  . Not on file   Social Determinants of Health   Financial Resource Strain:   . Difficulty of Paying Living Expenses: Not on file  Food Insecurity:   . Worried About Charity fundraiser in the Last Year: Not on file  . Ran Out of Food in the Last Year: Not on file  Transportation Needs:   . Lack of Transportation (Medical): Not on file  . Lack of Transportation (Non-Medical): Not on file  Physical Activity:   . Days of Exercise per Week: Not on file  .  Minutes of Exercise per Session: Not on file  Stress:   . Feeling of Stress : Not on file  Social Connections:   . Frequency of Communication with Friends and Family: Not on file  . Frequency of Social Gatherings with Friends and Family: Not on file  . Attends Religious Services: Not on file  . Active Member of Clubs or Organizations: Not on file  . Attends Archivist Meetings: Not on file  . Marital Status: Not on file  Intimate Partner Violence:   . Fear of Current or Ex-Partner: Not on file  . Emotionally Abused: Not on file  . Physically Abused: Not on file  . Sexually Abused: Not on file    Family History  Problem Relation Age of Onset  . Stroke Mother   . Lung cancer Father 37   Allergies  Allergen Reactions  . Keppra [Levetiracetam] Other (See Comments)    Nausea and dizzy  . Marinol [Dronabinol] Other (See Comments)    Dizziness; bad dreams  . Lyrica [Pregabalin] Other (See Comments)    Made patient feel very dizzy and not feel good.     Current medications  ? Current Outpatient Medications  Medication Sig Dispense Refill  . albuterol (PROVENTIL HFA;VENTOLIN HFA) 108 (90 Base) MCG/ACT inhaler Inhale 2 puffs into the lungs every 6 (six) hours as needed for wheezing or shortness of breath.     Marland Kitchen apixaban (ELIQUIS) 5 MG TABS tablet Take 2 tablets (10 mg total) by mouth 2 (two) times daily. And then 5 mg twice  Daily starting 04/10/2020 60 tablet 3  . benzonatate (TESSALON) 100 MG capsule Take 1 capsule (100 mg total) by mouth 4 (four) times daily as needed for cough. 90 capsule 3  . chlorpheniramine-HYDROcodone (TUSSIONEX) 10-8 MG/5ML SUER Take 5 mLs by mouth at bedtime as needed for cough. 140 mL 0  . Cholecalciferol (VITAMIN D3) 1000 units CAPS Take 2,000 Units by mouth daily.     . Fluticasone-Salmeterol (ADVAIR DISKUS) 250-50 MCG/DOSE AEPB Inhale 1 puff into the lungs every 12 (twelve) hours.     Marland Kitchen ipratropium-albuterol (DUONEB) 0.5-2.5 (3) MG/3ML SOLN Take 3 mLs by nebulization every 4 (four) hours as needed. 360 mL 3  . mirtazapine (REMERON) 7.5 MG tablet Take 1 tablet (7.5 mg total) by mouth at bedtime. 30 tablet 3  . ondansetron (ZOFRAN) 4 MG tablet Take 1 tablet (4 mg total) by mouth every 8 (eight) hours as needed for nausea or vomiting. 45 tablet 3  . Respiratory Therapy Supplies (FLUTTER) DEVI 1 each by Does not apply route daily. 1 each 0  . SPIRIVA RESPIMAT 2.5 MCG/ACT AERS Inhale 2 puffs into the lungs daily.     . traMADol (ULTRAM) 50 MG tablet Take 1 tablet (50 mg total) by mouth every 12 (twelve) hours as needed for moderate pain or severe pain. 60 tablet 0  . vitamin B-12 (CYANOCOBALAMIN) 1000 MCG tablet Take 1,000 mcg by mouth daily.     No current facility-administered medications for this visit.   Facility-Administered Medications Ordered in Other Visits  Medication Dose Route Frequency Provider Last Rate Last Admin  . sodium chloride flush (NS) 0.9 % injection 10 mL  10 mL Intravenous PRN Cammie Sickle, MD         Abtx:  Anti-infectives (From admission, onward)   None      REVIEW OF SYSTEMS:  Weight loss No fever or chills Has sob even on minimal exertion No diarrhea appetite okay  She drinks 4 cans of boost She has no energy  Objective:  VITALS:  BP 91/68   Pulse (!) 101   Temp 97.9 F (36.6 C)   Resp 16   Ht _0  (1.549 m)   Wt 75 lb (34 kg)   SpO2 98%   BMI 14.17 kg/m    PHYSICAL EXAM:  General: emaciated , chronically ill Head: Normocephalic, without obvious abnormality, atraumatic. Eyes: Conjunctivae clear, anicteric sclerae. Pupils are equal ENT did not examine Neck: Supple, symmetrical, no adenopathy, thyroid: non tender no carotid bruit and no JVD. Back: No CVA tenderness. Lungs: Bilateral air entry, decreased on the right side Chest drain- purulent fluid Heart: S1-S2 Abdomen: Soft, non-tender,not distended. Bowel sounds normal. No masses Extremities: atraumatic, no cyanosis. No edema. No clubbing Skin: No rashes or lesions. Or bruising Lymph: Cervical, supraclavicular normal. Neurologic: Grossly non-focal Pertinent Labs Lab Results CBC    Component Value Date/Time   WBC 11.2 (H) 06/09/2020 1025   RBC 3.14 (L) 06/09/2020 1025   HGB 7.3 (L) 06/09/2020 1025   HCT 24.3 (L) 06/09/2020 1025   PLT 696 (H) 06/09/2020 1025   MCV 77.4 (L) 06/09/2020 1025   MCH 23.2 (L) 06/09/2020 1025   MCHC 30.0 06/09/2020 1025   RDW 16.8 (H) 06/09/2020 1025   LYMPHSABS 1.0 06/09/2020 1025   MONOABS 0.7 06/09/2020 1025   EOSABS 0.1 06/09/2020 1025   BASOSABS 0.1 06/09/2020 1025    CMP Latest Ref Rng & Units 06/09/2020 04/03/2020 04/02/2020  Glucose 70 - 99 mg/dL 104(H) 94 104(H)  BUN 8 - 23 mg/dL 20 26(H) 27(H)  Creatinine 0.44 - 1.00 mg/dL 0.66 0.51 0.66  Sodium 135 - 145 mmol/L 133(L) 135 132(L)  Potassium 3.5 - 5.1 mmol/L 4.2 3.6 4.6  Chloride 98 - 111 mmol/L 97(L) 101 97(L)  CO2 22 - 32 mmol/L _1 Calcium 8.9 - 10.3 mg/dL 8.4(L) 8.4(L) 8.3(L)   Total Protein 6.5 - 8.1 g/dL 8.1 - 6.5  Total Bilirubin 0.3 - 1.2 mg/dL 0.3 - 0.5  Alkaline Phos 38 - 126 U/L 143(H) - 135(H)  AST 15 - 41 U/L 19 - 15  ALT 0 - 44 U/L 14 - 10      Microbiology: 10/10/2018 acid-fast organism ID by nucleic acid amplification test is M avium complex.     Sept 2020     Impression/Recommendation 64 y.o.female  with a history of Lung cancer , COPD   Fibrocavitary  lesions in the right lung.  Was treated for Pulmonary MAC for 14 months with no improvement- underwent chest drain placement on 03/19/20 for lung abscess Aspergillus fumigatus +  fungitell, aspergillus antigen and antibody are mildly elevated - Hence will start Voriconazole- discussed the sideeffects including eyes, liver, drug interactions. Will follow her closely  Will give 132m BID Will reduce dose of eliquis to 2.5 mg BID-because of interaction with voriconazole  Dr.Brahmandy will take care of that  DVT legs- on eliquis  Right lung carcinoma.  Status post right lower lobectomy, radiation, recurrence, SBRT.  Followed by Dr. BYevette Edwardsand Dr. KMortimer Fries    COPD on inhalers and nebulizers Anemia.  .  Smoker: Quit smoking December 2019  Discussed the management with the patient and her sister in law who accompanied her to the visit in great detail. Labs in 2-3 weeeks

## 2020-06-15 NOTE — Patient Instructions (Addendum)
You are here for possible starting voriconazole for aspergillus in the lungs. You will reduce the dose of eliquis to 2.5 mg twice a day from this evening or tomorrow- On Saturday you will start voriconazole 100mg  tablet 2 am and 2 pm -1 hour before food. Take 200mg  twice a day for staurday and Sunday and then from Monday take 100mg  twice a day. Will do labs in 2-3 weeks

## 2020-06-16 ENCOUNTER — Other Ambulatory Visit: Payer: Self-pay | Admitting: *Deleted

## 2020-06-16 ENCOUNTER — Other Ambulatory Visit: Payer: Self-pay

## 2020-06-16 ENCOUNTER — Inpatient Hospital Stay: Payer: PPO

## 2020-06-16 ENCOUNTER — Telehealth: Payer: Self-pay | Admitting: Internal Medicine

## 2020-06-16 DIAGNOSIS — C3411 Malignant neoplasm of upper lobe, right bronchus or lung: Secondary | ICD-10-CM | POA: Diagnosis not present

## 2020-06-16 DIAGNOSIS — D649 Anemia, unspecified: Secondary | ICD-10-CM

## 2020-06-16 LAB — CBC WITH DIFFERENTIAL/PLATELET
Abs Immature Granulocytes: 0.02 10*3/uL (ref 0.00–0.07)
Basophils Absolute: 0.1 10*3/uL (ref 0.0–0.1)
Basophils Relative: 1 %
Eosinophils Absolute: 0.2 10*3/uL (ref 0.0–0.5)
Eosinophils Relative: 2 %
HCT: 24.4 % — ABNORMAL LOW (ref 36.0–46.0)
Hemoglobin: 7.2 g/dL — ABNORMAL LOW (ref 12.0–15.0)
Immature Granulocytes: 0 %
Lymphocytes Relative: 15 %
Lymphs Abs: 1.3 10*3/uL (ref 0.7–4.0)
MCH: 22.4 pg — ABNORMAL LOW (ref 26.0–34.0)
MCHC: 29.5 g/dL — ABNORMAL LOW (ref 30.0–36.0)
MCV: 75.8 fL — ABNORMAL LOW (ref 80.0–100.0)
Monocytes Absolute: 0.6 10*3/uL (ref 0.1–1.0)
Monocytes Relative: 7 %
Neutro Abs: 6.7 10*3/uL (ref 1.7–7.7)
Neutrophils Relative %: 75 %
Platelets: 808 10*3/uL — ABNORMAL HIGH (ref 150–400)
RBC: 3.22 MIL/uL — ABNORMAL LOW (ref 3.87–5.11)
RDW: 16.7 % — ABNORMAL HIGH (ref 11.5–15.5)
Smear Review: NORMAL
WBC: 8.9 10*3/uL (ref 4.0–10.5)
nRBC: 0 % (ref 0.0–0.2)

## 2020-06-16 LAB — SAMPLE TO BLOOD BANK

## 2020-06-16 LAB — PREPARE RBC (CROSSMATCH)

## 2020-06-16 MED ORDER — HEPARIN SOD (PORK) LOCK FLUSH 100 UNIT/ML IV SOLN
INTRAVENOUS | Status: AC
  Filled 2020-06-16: qty 5

## 2020-06-16 MED ORDER — SODIUM CHLORIDE 0.9% FLUSH
10.0000 mL | INTRAVENOUS | Status: DC | PRN
  Administered 2020-06-16: 10 mL via INTRAVENOUS
  Filled 2020-06-16: qty 10

## 2020-06-16 MED ORDER — ACETAMINOPHEN 325 MG PO TABS
650.0000 mg | ORAL_TABLET | Freq: Once | ORAL | Status: AC
  Administered 2020-06-16: 650 mg via ORAL
  Filled 2020-06-16: qty 2

## 2020-06-16 MED ORDER — SODIUM CHLORIDE 0.9% IV SOLUTION
250.0000 mL | Freq: Once | INTRAVENOUS | Status: AC
  Administered 2020-06-16: 250 mL via INTRAVENOUS
  Filled 2020-06-16: qty 250

## 2020-06-16 MED ORDER — HEPARIN SOD (PORK) LOCK FLUSH 100 UNIT/ML IV SOLN
500.0000 [IU] | Freq: Once | INTRAVENOUS | Status: AC
  Administered 2020-06-16: 500 [IU] via INTRAVENOUS
  Filled 2020-06-16: qty 5

## 2020-06-16 MED ORDER — DIPHENHYDRAMINE HCL 25 MG PO CAPS
25.0000 mg | ORAL_CAPSULE | Freq: Once | ORAL | Status: AC
  Administered 2020-06-16: 25 mg via ORAL
  Filled 2020-06-16: qty 1

## 2020-06-16 NOTE — Telephone Encounter (Signed)
On 9/14-spoke to patient regarding my discussion with Dr. Steva Ready ID; given the interaction with the antifungal-recommend cutting down the dose of Eliquis 2.5 mg twice daily.  Proceed with blood transfusion as planned on 9/15.   We will repeat ultrasound Dopplers/will order at next visit.

## 2020-06-17 LAB — TYPE AND SCREEN
ABO/RH(D): A POS
Antibody Screen: NEGATIVE
Unit division: 0

## 2020-06-17 LAB — BPAM RBC
Blood Product Expiration Date: 202109182359
ISSUE DATE / TIME: 202109151055
Unit Type and Rh: 600

## 2020-06-22 ENCOUNTER — Telehealth: Payer: Self-pay

## 2020-06-22 ENCOUNTER — Other Ambulatory Visit: Payer: Self-pay | Admitting: Infectious Diseases

## 2020-06-22 NOTE — Telephone Encounter (Signed)
Thanks [employer]

## 2020-06-22 NOTE — Telephone Encounter (Signed)
Submitted prior auth for Voriconazole 50 mg 2 tab bid. Quantity 120 to [employer] PA by dialing [phone]   ID [id number]  VDP#[id number]. Approved by Harriette at [employer] effective today. [employer] to notify pharmacy and patient.

## 2020-06-23 DIAGNOSIS — R0683 Snoring: Secondary | ICD-10-CM | POA: Diagnosis not present

## 2020-06-23 DIAGNOSIS — G4730 Sleep apnea, unspecified: Secondary | ICD-10-CM | POA: Diagnosis not present

## 2020-07-02 ENCOUNTER — Telehealth: Payer: Self-pay | Admitting: Adult Health

## 2020-07-02 NOTE — Telephone Encounter (Signed)
ONO Only 4 minutes out of an hour-decrease O2 sat, no O2 at this time.

## 2020-07-06 ENCOUNTER — Encounter: Payer: Self-pay | Admitting: Internal Medicine

## 2020-07-06 ENCOUNTER — Telehealth: Payer: Self-pay

## 2020-07-06 NOTE — Telephone Encounter (Signed)
Patient called asking about oxygen results for her overnight test...ibuprofen explained she needs to call pulminology. She then asked about her abx that got PA Approved and I called pharmacy only to report to patient it costs $193. I gave her GoodRx info and advised this is all we can give her due to side effects of IV anti fungals.

## 2020-07-06 NOTE — Progress Notes (Unsigned)
Patient called for pre assessment. She reports having pain right side pain. She rates pain at 8. She denies questions at this time but states she is sure she will have some for tomorrow.

## 2020-07-07 ENCOUNTER — Telehealth: Payer: Self-pay | Admitting: *Deleted

## 2020-07-07 ENCOUNTER — Inpatient Hospital Stay: Payer: PPO | Admitting: Internal Medicine

## 2020-07-07 ENCOUNTER — Inpatient Hospital Stay: Payer: PPO

## 2020-07-07 NOTE — Telephone Encounter (Signed)
Patient no showed for her lab/md possible blood transfusion apts today. I called patient at 9:45 am. She was asleep and stated that she didn't want to come today. "I didn't feel good. I don't want to come today." patient made aware that if she needed blood, patient may have to go to the SDS dept to receive the unit. She stated that she didn't want to go anywhere else for her blood transfusion.  Patient willing to come tomorrow at 8 am for port lab and md only. This apt was scheduled.  Dr. Rogue Bussing and Mitchell Heir, RN infusion nurse made aware of plan of care.

## 2020-07-08 ENCOUNTER — Other Ambulatory Visit: Payer: Self-pay

## 2020-07-08 ENCOUNTER — Encounter: Payer: Self-pay | Admitting: Internal Medicine

## 2020-07-08 ENCOUNTER — Inpatient Hospital Stay: Payer: PPO

## 2020-07-08 ENCOUNTER — Inpatient Hospital Stay: Payer: PPO | Attending: Internal Medicine

## 2020-07-08 ENCOUNTER — Inpatient Hospital Stay (HOSPITAL_BASED_OUTPATIENT_CLINIC_OR_DEPARTMENT_OTHER): Payer: PPO | Admitting: Internal Medicine

## 2020-07-08 VITALS — BP 109/76 | HR 97 | Temp 97.2°F | Resp 20

## 2020-07-08 DIAGNOSIS — R0609 Other forms of dyspnea: Secondary | ICD-10-CM | POA: Diagnosis not present

## 2020-07-08 DIAGNOSIS — R000 Tachycardia, unspecified: Secondary | ICD-10-CM | POA: Insufficient documentation

## 2020-07-08 DIAGNOSIS — Z7901 Long term (current) use of anticoagulants: Secondary | ICD-10-CM | POA: Insufficient documentation

## 2020-07-08 DIAGNOSIS — C3411 Malignant neoplasm of upper lobe, right bronchus or lung: Secondary | ICD-10-CM | POA: Diagnosis not present

## 2020-07-08 DIAGNOSIS — E871 Hypo-osmolality and hyponatremia: Secondary | ICD-10-CM

## 2020-07-08 DIAGNOSIS — Z87891 Personal history of nicotine dependence: Secondary | ICD-10-CM | POA: Insufficient documentation

## 2020-07-08 DIAGNOSIS — R634 Abnormal weight loss: Secondary | ICD-10-CM | POA: Diagnosis not present

## 2020-07-08 DIAGNOSIS — E860 Dehydration: Secondary | ICD-10-CM

## 2020-07-08 DIAGNOSIS — D649 Anemia, unspecified: Secondary | ICD-10-CM | POA: Diagnosis not present

## 2020-07-08 DIAGNOSIS — Z23 Encounter for immunization: Secondary | ICD-10-CM | POA: Insufficient documentation

## 2020-07-08 DIAGNOSIS — D509 Iron deficiency anemia, unspecified: Secondary | ICD-10-CM | POA: Diagnosis not present

## 2020-07-08 DIAGNOSIS — C3492 Malignant neoplasm of unspecified part of left bronchus or lung: Secondary | ICD-10-CM | POA: Insufficient documentation

## 2020-07-08 DIAGNOSIS — I82403 Acute embolism and thrombosis of unspecified deep veins of lower extremity, bilateral: Secondary | ICD-10-CM | POA: Diagnosis not present

## 2020-07-08 LAB — CBC WITH DIFFERENTIAL/PLATELET
Abs Immature Granulocytes: 0.04 10*3/uL (ref 0.00–0.07)
Basophils Absolute: 0.1 10*3/uL (ref 0.0–0.1)
Basophils Relative: 1 %
Eosinophils Absolute: 0.1 10*3/uL (ref 0.0–0.5)
Eosinophils Relative: 1 %
HCT: 31.1 % — ABNORMAL LOW (ref 36.0–46.0)
Hemoglobin: 9.3 g/dL — ABNORMAL LOW (ref 12.0–15.0)
Immature Granulocytes: 0 %
Lymphocytes Relative: 9 %
Lymphs Abs: 0.9 10*3/uL (ref 0.7–4.0)
MCH: 23.3 pg — ABNORMAL LOW (ref 26.0–34.0)
MCHC: 29.9 g/dL — ABNORMAL LOW (ref 30.0–36.0)
MCV: 77.8 fL — ABNORMAL LOW (ref 80.0–100.0)
Monocytes Absolute: 0.3 10*3/uL (ref 0.1–1.0)
Monocytes Relative: 3 %
Neutro Abs: 9.2 10*3/uL — ABNORMAL HIGH (ref 1.7–7.7)
Neutrophils Relative %: 86 %
Platelets: 620 10*3/uL — ABNORMAL HIGH (ref 150–400)
RBC: 4 MIL/uL (ref 3.87–5.11)
RDW: 19 % — ABNORMAL HIGH (ref 11.5–15.5)
WBC: 10.7 10*3/uL — ABNORMAL HIGH (ref 4.0–10.5)
nRBC: 0 % (ref 0.0–0.2)

## 2020-07-08 LAB — COMPREHENSIVE METABOLIC PANEL
ALT: 18 U/L (ref 0–44)
AST: 26 U/L (ref 15–41)
Albumin: 2.5 g/dL — ABNORMAL LOW (ref 3.5–5.0)
Alkaline Phosphatase: 126 U/L (ref 38–126)
Anion gap: 10 (ref 5–15)
BUN: 22 mg/dL (ref 8–23)
CO2: 22 mmol/L (ref 22–32)
Calcium: 8.5 mg/dL — ABNORMAL LOW (ref 8.9–10.3)
Chloride: 101 mmol/L (ref 98–111)
Creatinine, Ser: 0.53 mg/dL (ref 0.44–1.00)
GFR calc non Af Amer: >60 mL/min (ref >60)
Glucose, Bld: 237 mg/dL — ABNORMAL HIGH (ref 70–99)
Potassium: 3.7 mmol/L (ref 3.5–5.1)
Sodium: 133 mmol/L — ABNORMAL LOW (ref 135–145)
Total Bilirubin: 0.3 mg/dL (ref 0.3–1.2)
Total Protein: 8.7 g/dL — ABNORMAL HIGH (ref 6.5–8.1)

## 2020-07-08 LAB — SAMPLE TO BLOOD BANK

## 2020-07-08 MED ORDER — HEPARIN SOD (PORK) LOCK FLUSH 100 UNIT/ML IV SOLN
500.0000 [IU] | Freq: Once | INTRAVENOUS | Status: AC
  Administered 2020-07-08: 500 [IU] via INTRAVENOUS
  Filled 2020-07-08: qty 5

## 2020-07-08 MED ORDER — MIRTAZAPINE 15 MG PO TABS: 15.0000 mg | ORAL_TABLET | Freq: Every day | ORAL | 3 refills | Status: DC

## 2020-07-08 MED ORDER — HEPARIN SOD (PORK) LOCK FLUSH 100 UNIT/ML IV SOLN
INTRAVENOUS | Status: AC
  Filled 2020-07-08: qty 5

## 2020-07-08 MED ORDER — SODIUM CHLORIDE 0.9% FLUSH
10.0000 mL | INTRAVENOUS | Status: DC | PRN
  Administered 2020-07-08: 10 mL via INTRAVENOUS
  Filled 2020-07-08: qty 10

## 2020-07-08 MED ORDER — SODIUM CHLORIDE 0.9 % IV SOLN
INTRAVENOUS | Status: DC
  Filled 2020-07-08 (×2): qty 250

## 2020-07-08 NOTE — Assessment & Plan Note (Addendum)
#   Right upper lobe stage I lung cancer; s/p feb February 2019 SBRT-June 2021 CT scan [see below]-no obvious evidence of recurrence.  # Left lung stage III lung cancer-clinically  STABLE; See below  # Tachycardia/worsening fatigue/ Dyspnea on exertion/weight loss- IVFs; increase the remeron to 15 mg nightly.  New prescription given.  Will reach out to pulmonary- to check on pt's nighttime pulse oximetry.  #Bilateral lower extremity DVT[April 03, 2020]-currently on Eliquis 2.5 mg twice daily; [interaction with voriconazole.].  Clinically stable.  #Infectious issues:  bilateral MAC infection [Dr. Levester Fresh, ID]; persistent MAC infection-s/p poor tolerance to second line therapy amikacin.  Right upper lobe cavitary lesion s/p drain placement.  Aspergillus-on voriconazole as per ID.  #Anemia secondary to chronic inflammation s/p PRBC transfusion- Hb-9.3; HOLD PRBC transfusion.   # DISPOSITION:  # 1 Lit IVFs over 1 hour;  # Follow up in 3 week- MD- cbc/Cmp/port flush; HOLD tube; possible 1 unit of PRBC;Dr.B  Cc: Drs. Kasa/Ravikrishnan/ Crystal/ Hedrick.

## 2020-07-08 NOTE — Progress Notes (Signed)
[facility] OFFICE PROGRESS NOTE  Patient Care Team: [place] Pink, MD as PCP - General (Family Medicine) Cammie Sickle, MD as Medical Oncologist (Medical Oncology) Rockey Situ, Kathlene November, MD as Consulting Physician (Cardiology) Efrain Sella, MD as Consulting Physician (Gastroenterology) Noreene Filbert, MD as Referring Physician (Radiation Oncology)  Cancer Staging Malignant neoplasm of lung [facility]) Staging form: Lung, AJCC 7th Edition - Clinical: Stage IIIA (T3, N2, M0) - Signed by Forest Gleason, MD on 05/07/2015    Oncology History Overview Note  # Squamous cell carcinoma of right  LOWER LOBE OF  lung stage IIIA based on PET scan Biopsy of the (August of 2016). 2.  After 3 cycles of chemotherapy patient underwent resection of lung mass (November, 2016) ypT2B   ypN0 [Dr.Oaks]  status post right lower lobectomy with a 3. She  was started on carboplatinum and Taxol on a weekly basis and radiation therapy from January of 2017 4. After 2 cycles of carboplatinum patient had neuropathy grade 2 interfering with work so chemotherapy was put on hold and radiation was continued (January 19th, 2017) 5.  Chemotherapy was discontinued because of progressive neuropathy.  Patient is finishing of radiation therapy on November 19, 2015  # NOV 2017- CT- 42m right hilar LN; PET- Feb 2018- NED.   # DEC-JAN 2019- RUL Lung nodule [s/p Bronch; Dr.Kasa- non-diagnostic] SBRT Feb 2019  #October 10, 2018 bronchoscopy [Dr.Kasa]-negative for malignancy/positive for MAC infection-ethambutol [Dr.Ravishankar]; [facility] A[facility]- R LE DVT [xarelto]x STOP in end of dec 2019. Syncope- MRI- 13 mm cystic leision/asymptomatic/[facility]; Bil subacute cerebellar stroke;  AUG 2019 - GIB/anemia [EGD/colo- Dr.Toledo; NEG]; Aug 2019-  Severe hypokalemia- sec to Florinef-resolved.   DIAGNOSIS: RLL stage III lung ca; RUL stage I   GOALS: curative  CURRENT/MOST RECENT THERAPY :  Surveillaince    Malignant neoplasm of lung (HCC)  Malignant neoplasm of right upper lobe of lung (HCC)      INTERVAL HISTORY:  Claudia MILROY89y.[id number].  female pleasant patient above history of stage III lung cancer; and also stage I right upper lobe status post radiation February 2019; history of MAC infection-poor tolerance to therapy; and also right lung abscess status post drain; question Aspergillus infection on voriconazole and anemia secondary to chronic disease is here for follow-up.  The interim patient received PRBC transfusion for hemoglobin less than 8.   Patient continues to feel poorly.  Complains of ongoing cough; and shortness of breath especially with exertion.  However her oxygen levels have been > 95%.  States that she had been ordered nighttime pulse oximetry; not done yet.  Patient continues to have poor appetite.  Weight loss.  Positive for nausea.  No vomiting.  Review of Systems  Constitutional: Positive for malaise/fatigue and weight loss. Negative for chills, diaphoresis and fever.  HENT: Negative for nosebleeds and sore throat.   Eyes: Negative for double vision.  Respiratory: Positive for cough, sputum production and shortness of breath. Negative for hemoptysis and wheezing.   Cardiovascular: Negative for chest pain, palpitations, orthopnea and leg swelling.  Gastrointestinal: Negative for abdominal pain, blood in stool, constipation, diarrhea, heartburn, melena, nausea and vomiting.  Genitourinary: Negative for dysuria, frequency and urgency.  Musculoskeletal: Positive for back pain and joint pain.  Skin: Negative.  Negative for itching and rash.  Neurological: Negative for tingling, focal weakness, weakness and headaches.  Endo/Heme/Allergies: Does not bruise/bleed easily.  Psychiatric/Behavioral: Negative for depression. The patient is not nervous/anxious and  does not have insomnia.       PAST MEDICAL HISTORY :  Past Medical History:  Diagnosis Date   . Cancer of lower lobe of right lung ([place]) 05/07/2015  . COPD (chronic obstructive pulmonary disease) ([place])   . Dyspnea   . Non-small cell lung cancer ([place])   . Pneumonia 04/2015  . Pulmonary Mycobacterium avium complex (MAC) infection ([place]) 10/25/2018    PAST SURGICAL HISTORY :   Past Surgical History:  Procedure Laterality Date  . ABDOMINAL HYSTERECTOMY    . COLONOSCOPY WITH PROPOFOL N/A 06/09/2018   Procedure: COLONOSCOPY WITH PROPOFOL;  Surgeon: Toledo, Benay Pike, MD;  Location: [facility] ENDOSCOPY;  Service: Gastroenterology;  Laterality: N/A;  . ELBOW SURGERY Right 1995  . ELECTROMAGNETIC NAVIGATION BROCHOSCOPY N/A 10/25/2017   Procedure: ELECTROMAGNETIC NAVIGATION BRONCHOSCOPY;  Surgeon: [name], MD;  Location: [facility] ORS;  Service: Cardiopulmonary;  Laterality: N/A;  . ESOPHAGOGASTRODUODENOSCOPY N/A 06/08/2018   Procedure: ESOPHAGOGASTRODUODENOSCOPY (EGD);  Surgeon: Toledo, Benay Pike, MD;  Location: [facility] ENDOSCOPY;  Service: Gastroenterology;  Laterality: N/A;  . FLEXIBLE BRONCHOSCOPY Right 10/10/2018   Procedure: FLEXIBLE BRONCHOSCOPY;  Surgeon: [name], MD;  Location: [facility] ORS;  Service: Cardiopulmonary;  Laterality: Right;  . PORTACATH PLACEMENT Right 05/10/2015   Procedure: INSERTION PORT-A-CATH;  Surgeon: Nestor Lewandowsky, MD;  Location: [facility] ORS;  Service: General;  Laterality: Right;  Marland Kitchen VIDEO ASSISTED THORACOSCOPY (VATS)/THOROCOTOMY Right 08/18/2015   Procedure: VIDEO ASSISTED THORACOSCOPY (VATS)/THOROCOTOMY;  Surgeon: Nestor Lewandowsky, MD;  Location: [facility] ORS;  Service: General;  Laterality: Right;    FAMILY HISTORY :   Family History  Problem Relation Age of Onset  . Stroke Mother   . Lung cancer Father 43    SOCIAL HISTORY:   Social History   Tobacco Use  . Smoking status: Former Smoker    Packs/day: 0.50    Years: 40.00    Pack years: 20.00    Types: Cigarettes    Quit date: 12/01/2019    Years since quitting: 0.6  . Smokeless tobacco: Never Used  Vaping Use  . Vaping  Use: Never used  Substance Use Topics  . Alcohol use: Not Currently    Alcohol/week: 2.0 - 4.0 standard drinks    Types: 2 - 4 Cans of beer per week    Comment: beer-occasionally  . Drug use: No    ALLERGIES:  is allergic to keppra [levetiracetam], marinol [dronabinol], and lyrica [pregabalin].  MEDICATIONS:  Current Outpatient Medications  Medication Sig Dispense Refill  . albuterol (PROVENTIL HFA;VENTOLIN HFA) 108 (90 Base) MCG/ACT inhaler Inhale 2 puffs into the lungs every 6 (six) hours as needed for wheezing or shortness of breath.     Marland Kitchen apixaban (ELIQUIS) 2.5 MG TABS tablet Take 1 tablet (2.5 mg total) by mouth 2 (two) times daily. 60 tablet 2  . benzonatate (TESSALON) 100 MG capsule Take 1 capsule (100 mg total) by mouth 4 (four) times daily as needed for cough. 90 capsule 3  . chlorpheniramine-HYDROcodone (TUSSIONEX) 10-8 MG/5ML SUER Take 5 mLs by mouth at bedtime as needed for cough. 140 mL 0  . Cholecalciferol (VITAMIN D3) 1000 units CAPS Take 2,000 Units by mouth daily.     . Fluticasone-Salmeterol (ADVAIR DISKUS) 250-50 MCG/DOSE AEPB Inhale 1 puff into the lungs every 12 (twelve) hours.     Marland Kitchen ipratropium-albuterol (DUONEB) 0.5-2.5 (3) MG/3ML SOLN Take 3 mLs by nebulization every 4 (four) hours as needed. 360 mL 3  . mirtazapine (REMERON) 15 MG tablet Take 1 tablet (15 mg total) by  mouth at bedtime. 30 tablet 3  . ondansetron (ZOFRAN) 4 MG tablet Take 1 tablet (4 mg total) by mouth every 8 (eight) hours as needed for nausea or vomiting. 45 tablet 3  . Respiratory Therapy Supplies (FLUTTER) DEVI 1 each by Does not apply route daily. 1 each 0  . SPIRIVA RESPIMAT 2.5 MCG/ACT AERS Inhale 2 puffs into the lungs daily.     . traMADol (ULTRAM) 50 MG tablet Take 1 tablet (50 mg total) by mouth every 12 (twelve) hours as needed for moderate pain or severe pain. 60 tablet 0  . vitamin B-12 (CYANOCOBALAMIN) 1000 MCG tablet Take 1,000 mcg by mouth daily.    Marland Kitchen voriconazole (VFEND) 50 MG  tablet Take 2 tablets (100 mg total) by mouth 2 (two) times daily. 120 tablet 0   No current facility-administered medications for this visit.   Facility-Administered Medications Ordered in Other Visits  Medication Dose Route Frequency Provider Last Rate Last Admin  . sodium chloride flush (NS) 0.9 % injection 10 mL  10 mL Intravenous PRN Cammie Sickle, MD        PHYSICAL EXAMINATION: ECOG PERFORMANCE STATUS: 1 - Symptomatic but completely ambulatory  BP 94/63 (BP Location: Left Arm, Patient Position: Sitting, Cuff Size: Normal)   Pulse (!) 117   Temp (!) 97.2 F (36.2 C) (Tympanic)   Resp 20   Ht _0  (1.549 m)   Wt 73 lb (33.1 kg)   SpO2 100%   BMI 13.79 kg/m   Filed Weights   07/08/20 0832  Weight: 73 lb (33.1 kg)    Physical Exam Constitutional:      Comments: Cachectic appearing thin built Caucasian female patient.  She is alone.  She had a wheelchair.  HENT:     Head: Normocephalic and atraumatic.     Mouth/Throat:     Pharynx: No oropharyngeal exudate.  Eyes:     Pupils: Pupils are equal, round, and reactive to light.  Cardiovascular:     Rate and Rhythm: Normal rate and regular rhythm.  Pulmonary:     Effort: No respiratory distress.     Breath sounds: No wheezing.     Comments: Decreased air entry bilaterally right more than left.  Patient has a drain in the right chest wall. Abdominal:     General: Bowel sounds are normal. There is no distension.     Palpations: Abdomen is soft. There is no mass.     Tenderness: There is no abdominal tenderness. There is no guarding or rebound.  Musculoskeletal:        General: No tenderness. Normal range of motion.     Cervical back: Normal range of motion and neck supple.  Skin:    General: Skin is warm.  Neurological:     Mental Status: She is alert and oriented to person, place, and time.  Psychiatric:        Mood and Affect: Affect normal.    LABORATORY DATA:  I have reviewed the data as listed     Component Value Date/Time   NA 133 (L) 07/08/2020 0821   NA 137 07/30/2018 1245   K 3.7 07/08/2020 0821   CL 101 07/08/2020 0821   CO2 22 07/08/2020 0821   GLUCOSE 237 (H) 07/08/2020 0821   BUN 22 07/08/2020 0821   BUN 11 07/30/2018 1245   CREATININE 0.53 07/08/2020 0821   CALCIUM 8.5 (L) 07/08/2020 0821   PROT 8.7 (H) 07/08/2020 0821   ALBUMIN 2.5 (L) 07/08/2020  0821   AST 26 07/08/2020 0821   ALT 18 07/08/2020 0821   ALKPHOS 126 07/08/2020 0821   BILITOT 0.3 07/08/2020 0821   GFRNONAA >60 07/08/2020 0821   GFRAA >60 06/09/2020 1025    No results found for: SPEP, UPEP  Lab Results  Component Value Date   WBC 10.7 (H) 07/08/2020   NEUTROABS 9.2 (H) 07/08/2020   HGB 9.3 (L) 07/08/2020   HCT 31.1 (L) 07/08/2020   MCV 77.8 (L) 07/08/2020   PLT 620 (H) 07/08/2020      Chemistry      Component Value Date/Time   NA 133 (L) 07/08/2020 0821   NA 137 07/30/2018 1245   K 3.7 07/08/2020 0821   CL 101 07/08/2020 0821   CO2 22 07/08/2020 0821   BUN 22 07/08/2020 0821   BUN 11 07/30/2018 1245   CREATININE 0.53 07/08/2020 0821      Component Value Date/Time   CALCIUM 8.5 (L) 07/08/2020 0821   ALKPHOS 126 07/08/2020 0821   AST 26 07/08/2020 0821   ALT 18 07/08/2020 0821   BILITOT 0.3 07/08/2020 0821       RADIOGRAPHIC STUDIES: I have personally reviewed the radiological images as listed and agreed with the findings in the report. No results found.   ASSESSMENT & PLAN:  Malignant neoplasm of right upper lobe of lung [facility]) # Right upper lobe stage I lung cancer; s/p feb February 2019 SBRT-June 2021 CT scan [see below]-no obvious evidence of recurrence.  # Left lung stage III lung cancer-clinically  STABLE; See below  # Tachycardia/worsening fatigue/ Dyspnea on exertion/weight loss- IVFs; increase the remeron to 15 mg nightly.  New prescription given.  Will reach out to pulmonary- to check on pt's nighttime pulse oximetry.  #Bilateral lower extremity DVT[April 03, 2020]-currently on Eliquis 2.5 mg twice daily; [interaction with voriconazole.].  Clinically stable.  #Infectious issues:  bilateral MAC infection [Dr. Levester Fresh, ID]; persistent MAC infection-s/p poor tolerance to second line therapy amikacin.  Right upper lobe cavitary lesion s/p drain placement.  Aspergillus-on voriconazole as per ID.  #Anemia secondary to chronic inflammation s/p PRBC transfusion- Hb-9.3; HOLD PRBC transfusion.   # DISPOSITION:  # 1 Lit IVFs over 1 hour;  # Follow up in 3 week- MD- cbc/Cmp/port flush; HOLD tube; possible 1 unit of PRBC;Dr.B  Cc: Drs. Kasa/Ravikrishnan/ [place]/ [place].     Orders Placed This Encounter  Procedures  . CBC with Differential    Standing Status:   Future    Standing Expiration Date:   07/08/2021  . Comprehensive metabolic panel    Standing Status:   Future    Standing Expiration Date:   07/08/2021  . Hold Tube- Blood Bank    Standing Status:   Future    Standing Expiration Date:   07/08/2021   All questions were answered. The patient knows to call the clinic with any problems, questions or concerns.      Cammie Sickle, MD 07/09/2020 1:22 PM

## 2020-07-08 NOTE — Progress Notes (Signed)
Pt states she has not been feeling well since Monday. States she has increased SOB, very fatigued and low energy.

## 2020-07-12 ENCOUNTER — Telehealth: Payer: Self-pay

## 2020-07-12 NOTE — Telephone Encounter (Signed)
Nutrition Follow-up:  Patient with lung cancer and persistent MAC infection.  Currently not on treatment for lung cancer.    Spoke with patient via phone for nutrition follow-up.  Patient reports that she has a good appetite and that she is eating.  Reports that she has access to food and plenty of food in her house.  She does not cook due to shortness of breath walking to kitchen.  Has easy to prepare foods and family and friends bring her food in.  Reports that today she has had a small chicken breast with 1/2 cup rice and gravy, normal piece of cake, pie, some cookies, Boost Plus, 1/2 cup chicken salad.  Tries to drink another boost plus in the evening.  Reports that she keeps food near her that she can easily get too during the day.   Reports that some days she feels depressed and does not eat as much.  MD aware and increased remeron on 10/7.     Medications: remeron increased.   Labs: reviewed  Anthropometrics:   Weight 73 lb on 9/8 decreased from 75 lb   NUTRITION DIAGNOSIS: Inadequate oral intake continues   INTERVENTION:  Reviewed importance of meeting calorie goal daily to help prevent weight loss especially with good appetite.  Continue boost plus as often as possible for increase calories and protein  Continue remeron. Patient declined further follow-up from RD.  Patient will reach out to RD if needed in the future.  She has contact information    NEXT VISIT: patient to contact RD   Sahas Sluka B. Zenia Resides, [place], [place] Registered Dietitian [phone] (mobile)

## 2020-07-22 ENCOUNTER — Telehealth: Payer: Self-pay | Admitting: *Deleted

## 2020-07-22 ENCOUNTER — Other Ambulatory Visit: Payer: Self-pay

## 2020-07-22 ENCOUNTER — Ambulatory Visit (INDEPENDENT_AMBULATORY_CARE_PROVIDER_SITE_OTHER): Payer: PPO | Admitting: Cardiothoracic Surgery

## 2020-07-22 ENCOUNTER — Encounter: Payer: Self-pay | Admitting: Cardiothoracic Surgery

## 2020-07-22 VITALS — BP 135/82 | HR 99 | Temp 98.0°F | Resp 12 | Ht 60.0 in | Wt 77.0 lb

## 2020-07-22 DIAGNOSIS — J869 Pyothorax without fistula: Secondary | ICD-10-CM

## 2020-07-22 NOTE — Telephone Encounter (Signed)
Patient coming in this morning to see Dr Genevive Bi.

## 2020-07-22 NOTE — Progress Notes (Signed)
  She comes in today complaining of some drainage around the chest tube site.  Indeed the chest tube has become disconnected from the skin anchoring stitch and the skin anchoring stitch is pulled through.  There is drainage around the tube.  It is purulent in nature.  The bag itself is also purulent material in it.  She states that she would like to get rid of the Foley bag as this is burdensome to her.  Therefore today using local anesthetic I resecured the tube to the skin.  At have been withdrawn some and I advanced it slightly.  We will then placed a colostomy bag over the wound after cutting the catheter.  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  She will obtain an x-ray tomorrow morning and she will see Loree Fee, PA tomorrow.  If she is unhappy with the colostomy bag we will then exchange her pigtail catheter with a red rubber catheter or Foley catheter depending on what fits.  Overall she does look considerably better.  She looks healthier.  She is also gained several pounds over the last month.  She attributes this to Pedialyte that she has been drinking.

## 2020-07-22 NOTE — Telephone Encounter (Signed)
Patient called and is a patient of Dr.Oaks, she has a drain/bag coming out from her lung area and she thinks the bag may need to be changed. She stated that it is draining the bag but also out from the incision site. Please call and advise

## 2020-07-23 ENCOUNTER — Ambulatory Visit
Admission: RE | Admit: 2020-07-23 | Discharge: 2020-07-23 | Disposition: A | Discharge status: Home / Self Care | Payer: PPO | Source: Ambulatory Visit | Attending: Cardiothoracic Surgery | Admitting: Cardiothoracic Surgery

## 2020-07-23 ENCOUNTER — Encounter: Payer: Self-pay | Admitting: Physician Assistant

## 2020-07-23 ENCOUNTER — Other Ambulatory Visit: Payer: Self-pay

## 2020-07-23 ENCOUNTER — Ambulatory Visit
Admission: RE | Admit: 2020-07-23 | Discharge: 2020-07-23 | Disposition: A | Discharge status: Home / Self Care | Payer: PPO | Attending: Cardiothoracic Surgery | Admitting: Cardiothoracic Surgery

## 2020-07-23 ENCOUNTER — Ambulatory Visit (INDEPENDENT_AMBULATORY_CARE_PROVIDER_SITE_OTHER): Payer: PPO | Admitting: Physician Assistant

## 2020-07-23 VITALS — BP 127/78 | HR 120 | Temp 98.1°F | Resp 12

## 2020-07-23 DIAGNOSIS — J869 Pyothorax without fistula: Secondary | ICD-10-CM | POA: Insufficient documentation

## 2020-07-23 DIAGNOSIS — J439 Emphysema, unspecified: Secondary | ICD-10-CM | POA: Diagnosis not present

## 2020-07-23 DIAGNOSIS — J984 Other disorders of lung: Secondary | ICD-10-CM | POA: Diagnosis not present

## 2020-07-23 NOTE — Patient Instructions (Addendum)
Empty the bag at least once a day. Thedore Mins showed your sister in law and yourself how to change the bag itself. You may change the bag once a week or more if it gets soiled.   Follow up as needed. Call the office if you have any questions or concerns.

## 2020-07-23 NOTE — Progress Notes (Signed)
[facility] NOTE  07/23/2020  History of Present Illness: Claudia Chavez is a 64 y.o. female well known to our clinic secondary to right lung empyema with empyema drain. She was seen in the office yesterday by Dr Genevive Bi secondary to leakage around her drain and this becoming dislodged. Catheter was secured and a colostomy appliance was placed over this. She presents today for follow up of this. She reports this new system has been working out well for her. She brought family members with her today to review changes. Still with chronic cough. No other complaints. No issues otherwise.   Past Medical History: Past Medical History:  Diagnosis Date  . Cancer of lower lobe of right lung ([place]) 05/07/2015  . COPD (chronic obstructive pulmonary disease) ([place])   . Dyspnea   . Non-small cell lung cancer ([place])   . Pneumonia 04/2015  . Pulmonary Mycobacterium avium complex (MAC) infection ([place]) 10/25/2018     Past Surgical History: Past Surgical History:  Procedure Laterality Date  . ABDOMINAL HYSTERECTOMY    . COLONOSCOPY WITH PROPOFOL N/A 06/09/2018   Procedure: COLONOSCOPY WITH PROPOFOL;  Surgeon: Toledo, Benay Pike, MD;  Location: [facility]COPY;  Service: Gastroenterology;  Laterality: N/A;  . ELBOW SURGERY Right 1995  . ELECTROMAGNETIC NAVIGATION BROCHOSCOPY N/A 10/25/2017   Procedure: ELECTROMAGNETIC NAVIGATION BRONCHOSCOPY;  Surgeon: [name], MD;  Location: [facility] ORS;  Service: Cardiopulmonary;  Laterality: N/A;  . ESOPHAGOGASTRODUODENOSCOPY N/A 06/08/2018   Procedure: ESOPHAGOGASTRODUODENOSCOPY (EGD);  Surgeon: Toledo, Benay Pike, MD;  Location: [facility];  Service: Gastroenterology;  Laterality: N/A;  . FLEXIBLE BRONCHOSCOPY Right 10/10/2018   Procedure: FLEXIBLE BRONCHOSCOPY;  Surgeon: [name], MD;  Location: [facility] ORS;  Service: Cardiopulmonary;  Laterality: Right;  . PORTACATH PLACEMENT Right 05/10/2015   Procedure: INSERTION PORT-A-CATH;  Surgeon:  Nestor Lewandowsky, MD;  Location: [facility] ORS;  Service: General;  Laterality: Right;  Marland Kitchen VIDEO ASSISTED THORACOSCOPY (VATS)/THOROCOTOMY Right 08/18/2015   Procedure: VIDEO ASSISTED THORACOSCOPY (VATS)/THOROCOTOMY;  Surgeon: Nestor Lewandowsky, MD;  Location: [facility] ORS;  Service: General;  Laterality: Right;    Home Medications: Prior to Admission medications   Medication Sig Start Date End Date Taking? Authorizing Provider  albuterol (PROVENTIL HFA;VENTOLIN HFA) 108 (90 Base) MCG/ACT inhaler Inhale 2 puffs into the lungs every 6 (six) hours as needed for wheezing or shortness of breath.     [provider]  apixaban (ELIQUIS) 2.5 MG TABS tablet Take 1 tablet (2.5 mg total) by mouth 2 (two) times daily. 06/15/20   Cammie Sickle, MD  benzonatate (TESSALON) 100 MG capsule Take 1 capsule (100 mg total) by mouth 4 (four) times daily as needed for cough. 05/19/20   Cammie Sickle, MD  Cholecalciferol (VITAMIN D3) 1000 units CAPS Take 2,000 Units by mouth daily.     [provider]  Fluticasone-Salmeterol (ADVAIR DISKUS) 250-50 MCG/DOSE AEPB Inhale 1 puff into the lungs every 12 (twelve) hours.  09/05/17   [provider]  ipratropium-albuterol (DUONEB) 0.5-2.5 (3) MG/3ML SOLN Take 3 mLs by nebulization every 4 (four) hours as needed. 09/09/19   Cammie Sickle, MD  mirtazapine (REMERON) 15 MG tablet Take 1 tablet (15 mg total) by mouth at bedtime. 07/08/20   Cammie Sickle, MD  ondansetron (ZOFRAN) 4 MG tablet Take 1 tablet (4 mg total) by mouth every 8 (eight) hours as needed for nausea or vomiting. 06/09/20   Cammie Sickle, MD  Respiratory Therapy Supplies (FLUTTER) DEVI 1 each by Does not apply route  daily. 09/12/18   [name], MD  SPIRIVA RESPIMAT 2.5 MCG/ACT AERS Inhale 2 puffs into the lungs daily.  10/17/19   [provider]  traMADol (ULTRAM) 50 MG tablet Take 1 tablet (50 mg total) by mouth every 12 (twelve) hours as needed for moderate pain  or severe pain. 06/09/20   Cammie Sickle, MD  vitamin B-12 (CYANOCOBALAMIN) 1000 MCG tablet Take 1,000 mcg by mouth daily.    [provider]    Allergies: Allergies  Allergen Reactions  . Keppra [Levetiracetam] Other (See Comments)    Nausea and dizzy  . Marinol [Dronabinol] Other (See Comments)    Dizziness; bad dreams  . Lyrica [Pregabalin] Other (See Comments)    Made patient feel very dizzy and not feel good.     Review of Systems: Review of Systems  Constitutional: Negative for chills and fever.  Respiratory: Positive for cough. Negative for shortness of breath.   Cardiovascular: Negative for chest pain and palpitations.  Gastrointestinal: Negative for diarrhea, nausea and vomiting.  All other systems reviewed and are negative.   Physical Exam BP 127/78   Pulse (!) 120   Temp 98.1 F (36.7 C)   Resp 12   SpO2 98%   Physical Exam Constitutional:      General: She is not in acute distress.    Appearance: Normal appearance. She is not ill-appearing.     Comments: Thin appearing female, ND  HENT:     Head: Normocephalic and atraumatic.  Eyes:     Conjunctiva/sclera: Conjunctivae normal.     Pupils: Pupils are equal, round, and reactive to light.     Comments: Wearing glasses  Cardiovascular:     Rate and Rhythm: Normal rate and regular rhythm.     Pulses: Normal pulses.     Heart sounds: No murmur heard.   Pulmonary:     Effort: Pulmonary effort is normal. No respiratory distress.     Breath sounds: Normal breath sounds.  Chest:    Genitourinary:    Comments: Deferred Musculoskeletal:     Right lower leg: No edema.     Left lower leg: No edema.  Skin:    General: Skin is warm and dry.     Coloration: Skin is not pale.     Findings: No erythema.  Neurological:     General: No focal deficit present.     Mental Status: She is alert and oriented to person, place, and time.  Psychiatric:        Mood and Affect: Mood normal.         Behavior: Behavior normal.     Labs/Imaging:  CXR (07/23/2020) personally reviewed showing right sided empyema cavity with drain in place, and radiologist report pending   Assessment and Plan: This is a 64 y.o. female right sided empyema s/p drainage who presents for follow up secondary to recent issues with her drain.     - She is happy with the current management system in place with Dr Genevive Bi. The drain appears in good position on CXR and secured well at this time. She deferred any exchange of this today. She would like to continue with current plan. I reviewed and performed a dressing change at bedside with patient and her family who appeared understanding and capable of this at home. She should continue to empty the bag daily + prn and change entire dressing weekly + prn. She will determine if she is being followed by home health agency for  additional supplies if needed. She will continue to try and gain weight. We will follow up prn for potential Eloesser flap.   Face-to-face time spent with the patient and care providers was 30 minutes, with more than 50% of the time spent counseling, educating, and coordinating care of the patient.    -- Edison Simon, PA-C [facility] 07/23/2020, 10:53 AM [phone] M-F: 7am - 4pm

## 2020-07-28 ENCOUNTER — Other Ambulatory Visit: Payer: Self-pay

## 2020-07-29 ENCOUNTER — Other Ambulatory Visit: Payer: Self-pay

## 2020-07-29 ENCOUNTER — Inpatient Hospital Stay: Payer: PPO

## 2020-07-29 ENCOUNTER — Inpatient Hospital Stay (HOSPITAL_BASED_OUTPATIENT_CLINIC_OR_DEPARTMENT_OTHER): Payer: PPO | Admitting: Internal Medicine

## 2020-07-29 ENCOUNTER — Encounter: Payer: Self-pay | Admitting: Internal Medicine

## 2020-07-29 VITALS — BP 102/65 | HR 96

## 2020-07-29 DIAGNOSIS — E611 Iron deficiency: Secondary | ICD-10-CM | POA: Insufficient documentation

## 2020-07-29 DIAGNOSIS — Z23 Encounter for immunization: Secondary | ICD-10-CM

## 2020-07-29 DIAGNOSIS — E876 Hypokalemia: Secondary | ICD-10-CM

## 2020-07-29 DIAGNOSIS — C3411 Malignant neoplasm of upper lobe, right bronchus or lung: Secondary | ICD-10-CM | POA: Diagnosis not present

## 2020-07-29 LAB — CBC WITH DIFFERENTIAL/PLATELET
Abs Immature Granulocytes: 0.06 10*3/uL (ref 0.00–0.07)
Basophils Absolute: 0.1 10*3/uL (ref 0.0–0.1)
Basophils Relative: 1 %
Eosinophils Absolute: 0.1 10*3/uL (ref 0.0–0.5)
Eosinophils Relative: 1 %
HCT: 27.5 % — ABNORMAL LOW (ref 36.0–46.0)
Hemoglobin: 8.2 g/dL — ABNORMAL LOW (ref 12.0–15.0)
Immature Granulocytes: 1 %
Lymphocytes Relative: 11 %
Lymphs Abs: 1.2 10*3/uL (ref 0.7–4.0)
MCH: 23 pg — ABNORMAL LOW (ref 26.0–34.0)
MCHC: 29.8 g/dL — ABNORMAL LOW (ref 30.0–36.0)
MCV: 77.2 fL — ABNORMAL LOW (ref 80.0–100.0)
Monocytes Absolute: 0.7 10*3/uL (ref 0.1–1.0)
Monocytes Relative: 7 %
Neutro Abs: 8.5 10*3/uL — ABNORMAL HIGH (ref 1.7–7.7)
Neutrophils Relative %: 79 %
Platelets: 563 10*3/uL — ABNORMAL HIGH (ref 150–400)
RBC: 3.56 MIL/uL — ABNORMAL LOW (ref 3.87–5.11)
RDW: 19.5 % — ABNORMAL HIGH (ref 11.5–15.5)
WBC: 10.7 10*3/uL — ABNORMAL HIGH (ref 4.0–10.5)
nRBC: 0 % (ref 0.0–0.2)

## 2020-07-29 LAB — COMPREHENSIVE METABOLIC PANEL
ALT: 14 U/L (ref 0–44)
AST: 17 U/L (ref 15–41)
Albumin: 2.4 g/dL — ABNORMAL LOW (ref 3.5–5.0)
Alkaline Phosphatase: 123 U/L (ref 38–126)
Anion gap: 7 (ref 5–15)
BUN: 18 mg/dL (ref 8–23)
CO2: 24 mmol/L (ref 22–32)
Calcium: 8.2 mg/dL — ABNORMAL LOW (ref 8.9–10.3)
Chloride: 100 mmol/L (ref 98–111)
Creatinine, Ser: 0.46 mg/dL (ref 0.44–1.00)
GFR, Estimated: >60 mL/min (ref >60)
Glucose, Bld: 94 mg/dL (ref 70–99)
Potassium: 4.4 mmol/L (ref 3.5–5.1)
Sodium: 131 mmol/L — ABNORMAL LOW (ref 135–145)
Total Bilirubin: 0.3 mg/dL (ref 0.3–1.2)
Total Protein: 8.2 g/dL — ABNORMAL HIGH (ref 6.5–8.1)

## 2020-07-29 LAB — IRON AND TIBC
Iron: 21 ug/dL — ABNORMAL LOW (ref 28–170)
Saturation Ratios: 7 % — ABNORMAL LOW (ref 10.4–31.8)
TIBC: 294 ug/dL (ref 250–450)
UIBC: 273 ug/dL

## 2020-07-29 LAB — FERRITIN: Ferritin: 18 ng/mL (ref 11–307)

## 2020-07-29 LAB — SAMPLE TO BLOOD BANK

## 2020-07-29 MED ORDER — SODIUM CHLORIDE 0.9% FLUSH
10.0000 mL | Freq: Once | INTRAVENOUS | Status: AC
  Administered 2020-07-29: 10 mL via INTRAVENOUS
  Filled 2020-07-29: qty 10

## 2020-07-29 MED ORDER — SODIUM CHLORIDE 0.9 % IV SOLN
Freq: Once | INTRAVENOUS | Status: AC
  Filled 2020-07-29: qty 250

## 2020-07-29 MED ORDER — HEPARIN SOD (PORK) LOCK FLUSH 100 UNIT/ML IV SOLN
INTRAVENOUS | Status: AC
  Filled 2020-07-29: qty 5

## 2020-07-29 MED ORDER — SODIUM CHLORIDE 0.9 % IV SOLN: 200.0000 mg | Freq: Once | INTRAVENOUS | Status: DC

## 2020-07-29 MED ORDER — TRAMADOL HCL 50 MG PO TABS: 50.0000 mg | ORAL_TABLET | Freq: Two times a day (BID) | ORAL | 0 refills | Status: DC | PRN

## 2020-07-29 MED ORDER — INFLUENZA VAC A&B SA ADJ QUAD 0.5 ML IM PRSY
0.5000 mL | PREFILLED_SYRINGE | Freq: Once | INTRAMUSCULAR | Status: AC
  Administered 2020-07-29: 0.5 mL via INTRAMUSCULAR
  Filled 2020-07-29: qty 0.5

## 2020-07-29 MED ORDER — IRON SUCROSE 20 MG/ML IV SOLN
200.0000 mg | Freq: Once | INTRAVENOUS | Status: AC
  Administered 2020-07-29: 200 mg via INTRAVENOUS
  Filled 2020-07-29: qty 10

## 2020-07-29 MED ORDER — HEPARIN SOD (PORK) LOCK FLUSH 100 UNIT/ML IV SOLN
500.0000 [IU] | Freq: Once | INTRAVENOUS | Status: AC | PRN
  Administered 2020-07-29: 500 [IU]
  Filled 2020-07-29: qty 5

## 2020-07-29 NOTE — Progress Notes (Signed)
Pt in for follow up and possible blood transfusion today.  States has had a very bad week and is very short of breath all the time.

## 2020-07-29 NOTE — Assessment & Plan Note (Addendum)
#   Right upper lobe stage I lung cancer; s/p feb February 2019 SBRT-June 2021 CT scan [see below]-no obvious evidence of recurrence.  # Left lung stage III lung cancer-clinically  STABLE; See below  #Bilateral lower extremity DVT[April 03, 2020]-currently on Eliquis 2.5 mg twice daily; [interaction with voriconazole.].  Clinically STABLE.   #Infectious issues:  bilateral MAC infection [Dr. Levester Fresh, ID]; persistent MAC infection-s/p poor tolerance to second line therapy amikacin.  Right upper lobe cavitary lesion s/p drain placement.  Aspergillus-on voriconazole as per ID.  #Anemia secondary to iron deficiency Hb-8.2; ; HOLD PRBC transfusion.  Check iron studies today.  # # Patient is s/p Covid-19 x1. Needs to call- to set up home COVID shots.  Benefits of Covid vaccine outweigh the risk at any time; and strongly recommend COVID-19 vaccination.   # DISPOSITION: check iron studies/ferritin today # HOLD PRBC transfusion today.  # Flu shost today # Venofer today # Follow up TBD- Dr.B  Addendum: October 2021 iron studies-saturation 7% ferritin 18.  Recommend Venofer x 3 more.   Cc: Drs. Kasa/Ravikrishnan/ Crystal/ Hedrick.

## 2020-07-29 NOTE — Progress Notes (Signed)
[facility] OFFICE PROGRESS NOTE  Patient Care Team: [place] Pink, MD as PCP - General (Family Medicine) Cammie Sickle, MD as Medical Oncologist (Medical Oncology) Rockey Situ, Kathlene November, MD as Consulting Physician (Cardiology) Efrain Sella, MD as Consulting Physician (Gastroenterology) Noreene Filbert, MD as Referring Physician (Radiation Oncology)  Cancer Staging Malignant neoplasm of lung [facility]) Staging form: Lung, AJCC 7th Edition - Clinical: Stage IIIA (T3, N2, M0) - Signed by Forest Gleason, MD on 05/07/2015    Oncology History Overview Note  # Squamous cell carcinoma of right  LOWER LOBE OF  lung stage IIIA based on PET scan Biopsy of the (August of 2016). 2.  After 3 cycles of chemotherapy patient underwent resection of lung mass (November, 2016) [id number]   ypN0 [Dr.Oaks]  status post right lower lobectomy with a 3. She  was started on carboplatinum and Taxol on a weekly basis and radiation therapy from January of 2017 4. After 2 cycles of carboplatinum patient had neuropathy grade 2 interfering with work so chemotherapy was put on hold and radiation was continued (January 19th, 2017) 5.  Chemotherapy was discontinued because of progressive neuropathy.  Patient is finishing of radiation therapy on November 19, 2015  # NOV 2017- CT- 17m right hilar LN; PET- Feb 2018- NED.   # DEC-JAN 2019- RUL Lung nodule [s/p Bronch; Dr.Kasa- non-diagnostic] SBRT Feb 2019  #October 10, 2018 bronchoscopy [Dr.Kasa]-negative for malignancy/positive for MAC infection-ethambutol [Dr.Ravishankar]; [facility] A[facility]- R LE DVT [xarelto]x STOP in end of dec 2019. Syncope- MRI- 13 mm cystic leision/asymptomatic/[facility]; Bil subacute cerebellar stroke;  AUG 2019 - GIB/anemia [EGD/colo- Dr.Toledo; NEG]; Aug 2019-  Severe hypokalemia- sec to Florinef-resolved.   DIAGNOSIS: RLL stage III lung ca; RUL stage I   GOALS: curative  CURRENT/MOST RECENT THERAPY :  Surveillaince    Malignant neoplasm of lung (HCC)  Malignant neoplasm of right upper lobe of lung (HCC)      INTERVAL HISTORY:  DPHYILLIS DASCOLI89y.o.  female pleasant patient above history of stage III lung cancer; and also stage I right upper lobe status post radiation February 2019; history of MAC infection-poor tolerance to therapy; and also right lung abscess status post drain; question Aspergillus infection on voriconazole and anemia secondary to chronic disease is here for follow-up.  In the interim patient was evaluated by thoracic surgery for chest pain.  She feels slightly improved since last visit post PRBC transfusion.  She continues have poor appetite.  Positive weight loss but positive for nausea no vomiting.  Review of Systems  Constitutional: Positive for malaise/fatigue and weight loss. Negative for chills, diaphoresis and fever.  HENT: Negative for nosebleeds and sore throat.   Eyes: Negative for double vision.  Respiratory: Positive for cough, sputum production and shortness of breath. Negative for hemoptysis and wheezing.   Cardiovascular: Negative for chest pain, palpitations, orthopnea and leg swelling.  Gastrointestinal: Negative for abdominal pain, blood in stool, constipation, diarrhea, heartburn, melena, nausea and vomiting.  Genitourinary: Negative for dysuria, frequency and urgency.  Musculoskeletal: Positive for back pain and joint pain.  Skin: Negative.  Negative for itching and rash.  Neurological: Negative for tingling, focal weakness, weakness and headaches.  Endo/Heme/Allergies: Does not bruise/bleed easily.  Psychiatric/Behavioral: Negative for depression. The patient is not nervous/anxious and does not have insomnia.       PAST MEDICAL HISTORY :  Past Medical History:  Diagnosis Date  . Cancer of lower lobe of right lung (  [place]) 05/07/2015  . COPD (chronic obstructive pulmonary disease) ([place])   . Dyspnea   . Non-small cell lung cancer ([place])    . Pneumonia 04/2015  . Pulmonary Mycobacterium avium complex (MAC) infection ([place]) 10/25/2018    PAST SURGICAL HISTORY :   Past Surgical History:  Procedure Laterality Date  . ABDOMINAL HYSTERECTOMY    . COLONOSCOPY WITH PROPOFOL N/A 06/09/2018   Procedure: COLONOSCOPY WITH PROPOFOL;  Surgeon: Toledo, Benay Pike, MD;  Location: [facility] ENDOSCOPY;  Service: Gastroenterology;  Laterality: N/A;  . ELBOW SURGERY Right 1995  . ELECTROMAGNETIC NAVIGATION BROCHOSCOPY N/A 10/25/2017   Procedure: ELECTROMAGNETIC NAVIGATION BRONCHOSCOPY;  Surgeon: [name], MD;  Location: [facility] ORS;  Service: Cardiopulmonary;  Laterality: N/A;  . ESOPHAGOGASTRODUODENOSCOPY N/A 06/08/2018   Procedure: ESOPHAGOGASTRODUODENOSCOPY (EGD);  Surgeon: Toledo, Benay Pike, MD;  Location: [facility] ENDOSCOPY;  Service: Gastroenterology;  Laterality: N/A;  . FLEXIBLE BRONCHOSCOPY Right 10/10/2018   Procedure: FLEXIBLE BRONCHOSCOPY;  Surgeon: [name], MD;  Location: [facility] ORS;  Service: Cardiopulmonary;  Laterality: Right;  . PORTACATH PLACEMENT Right 05/10/2015   Procedure: INSERTION PORT-A-CATH;  Surgeon: Nestor Lewandowsky, MD;  Location: [facility] ORS;  Service: General;  Laterality: Right;  Marland Kitchen VIDEO ASSISTED THORACOSCOPY (VATS)/THOROCOTOMY Right 08/18/2015   Procedure: VIDEO ASSISTED THORACOSCOPY (VATS)/THOROCOTOMY;  Surgeon: Nestor Lewandowsky, MD;  Location: [facility] ORS;  Service: General;  Laterality: Right;    FAMILY HISTORY :   Family History  Problem Relation Age of Onset  . Stroke Mother   . Lung cancer Father 53    SOCIAL HISTORY:   Social History   Tobacco Use  . Smoking status: Former Smoker    Packs/day: 0.50    Years: 40.00    Pack years: 20.00    Types: Cigarettes    Quit date: 12/01/2019    Years since quitting: 0.6  . Smokeless tobacco: Never Used  Vaping Use  . Vaping Use: Never used  Substance Use Topics  . Alcohol use: Not Currently    Alcohol/week: 2.0 - 4.0 standard drinks    Types: 2 - 4 Cans of beer per week     Comment: beer-occasionally  . Drug use: No    ALLERGIES:  is allergic to keppra [levetiracetam], marinol [dronabinol], and lyrica [pregabalin].  MEDICATIONS:  Current Outpatient Medications  Medication Sig Dispense Refill  . albuterol (PROVENTIL HFA;VENTOLIN HFA) 108 (90 Base) MCG/ACT inhaler Inhale 2 puffs into the lungs every 6 (six) hours as needed for wheezing or shortness of breath.     Marland Kitchen apixaban (ELIQUIS) 2.5 MG TABS tablet Take 1 tablet (2.5 mg total) by mouth 2 (two) times daily. 60 tablet 2  . benzonatate (TESSALON) 100 MG capsule Take 1 capsule (100 mg total) by mouth 4 (four) times daily as needed for cough. 90 capsule 3  . Cholecalciferol (VITAMIN D3) 1000 units CAPS Take 2,000 Units by mouth daily.     . Fluticasone-Salmeterol (ADVAIR DISKUS) 250-50 MCG/DOSE AEPB Inhale 1 puff into the lungs every 12 (twelve) hours.     Marland Kitchen ipratropium-albuterol (DUONEB) 0.5-2.5 (3) MG/3ML SOLN Take 3 mLs by nebulization every 4 (four) hours as needed. 360 mL 3  . mirtazapine (REMERON) 15 MG tablet Take 1 tablet (15 mg total) by mouth at bedtime. 30 tablet 3  . ondansetron (ZOFRAN) 4 MG tablet Take 1 tablet (4 mg total) by mouth every 8 (eight) hours as needed for nausea or vomiting. 45 tablet 3  . Respiratory Therapy Supplies (FLUTTER) DEVI 1 each by Does not apply route daily. 1 each  0  . SPIRIVA RESPIMAT 2.5 MCG/ACT AERS Inhale 2 puffs into the lungs daily.     . vitamin B-12 (CYANOCOBALAMIN) 1000 MCG tablet Take 1,000 mcg by mouth daily.    . traMADol (ULTRAM) 50 MG tablet Take 1 tablet (50 mg total) by mouth every 12 (twelve) hours as needed for moderate pain or severe pain. 60 tablet 0   No current facility-administered medications for this visit.   Facility-Administered Medications Ordered in Other Visits  Medication Dose Route Frequency Provider Last Rate Last Admin  . sodium chloride flush (NS) 0.9 % injection 10 mL  10 mL Intravenous PRN Cammie Sickle, MD        PHYSICAL  EXAMINATION: ECOG PERFORMANCE STATUS: 1 - Symptomatic but completely ambulatory  BP 111/66 (BP Location: Left Arm, Patient Position: Sitting)   Pulse 95   Temp (!) 96.5 F (35.8 C) (Tympanic)   Resp 18   Wt 76 lb 3.2 oz (34.6 kg)   SpO2 98%   BMI 14.88 kg/m   Filed Weights   07/29/20 0905  Weight: 76 lb 3.2 oz (34.6 kg)    Physical Exam Constitutional:      Comments: Cachectic appearing thin built Caucasian female patient.  She is alone.  She had a wheelchair.  HENT:     Head: Normocephalic and atraumatic.     Mouth/Throat:     Pharynx: No oropharyngeal exudate.  Eyes:     Pupils: Pupils are equal, round, and reactive to light.  Cardiovascular:     Rate and Rhythm: Normal rate and regular rhythm.  Pulmonary:     Effort: No respiratory distress.     Breath sounds: No wheezing.     Comments: Decreased air entry bilaterally right more than left.  Patient has a drain in the right chest wall. Abdominal:     General: Bowel sounds are normal. There is no distension.     Palpations: Abdomen is soft. There is no mass.     Tenderness: There is no abdominal tenderness. There is no guarding or rebound.  Musculoskeletal:        General: No tenderness. Normal range of motion.     Cervical back: Normal range of motion and neck supple.  Skin:    General: Skin is warm.  Neurological:     Mental Status: She is alert and oriented to person, place, and time.  Psychiatric:        Mood and Affect: Affect normal.    LABORATORY DATA:  I have reviewed the data as listed    Component Value Date/Time   NA 131 (L) 07/29/2020 0839   NA 137 07/30/2018 1245   K 4.4 07/29/2020 0839   CL 100 07/29/2020 0839   CO2 24 07/29/2020 0839   GLUCOSE 94 07/29/2020 0839   BUN 18 07/29/2020 0839   BUN 11 07/30/2018 1245   CREATININE 0.46 07/29/2020 0839   CALCIUM 8.2 (L) 07/29/2020 0839   PROT 8.2 (H) 07/29/2020 0839   ALBUMIN 2.4 (L) 07/29/2020 0839   AST 17 07/29/2020 0839   ALT 14 07/29/2020  0839   ALKPHOS 123 07/29/2020 0839   BILITOT 0.3 07/29/2020 0839   GFRNONAA >60 07/29/2020 0839   GFRAA >60 06/09/2020 1025    No results found for: SPEP, UPEP  Lab Results  Component Value Date   WBC 10.7 (H) 07/29/2020   NEUTROABS 8.5 (H) 07/29/2020   HGB 8.2 (L) 07/29/2020   HCT 27.5 (L) 07/29/2020   MCV 77.2 (  L) 07/29/2020   PLT 563 (H) 07/29/2020      Chemistry      Component Value Date/Time   NA 131 (L) 07/29/2020 0839   NA 137 07/30/2018 1245   K 4.4 07/29/2020 0839   CL 100 07/29/2020 0839   CO2 24 07/29/2020 0839   BUN 18 07/29/2020 0839   BUN 11 07/30/2018 1245   CREATININE 0.46 07/29/2020 0839      Component Value Date/Time   CALCIUM 8.2 (L) 07/29/2020 0839   ALKPHOS 123 07/29/2020 0839   AST 17 07/29/2020 0839   ALT 14 07/29/2020 0839   BILITOT 0.3 07/29/2020 0839       RADIOGRAPHIC STUDIES: I have personally reviewed the radiological images as listed and agreed with the findings in the report. No results found.   ASSESSMENT & PLAN:  Malignant neoplasm of right upper lobe of lung [facility]) # Right upper lobe stage I lung cancer; s/p feb February 2019 SBRT-June 2021 CT scan [see below]-no obvious evidence of recurrence.  # Left lung stage III lung cancer-clinically  STABLE; See below  #Bilateral lower extremity DVT[April 03, 2020]-currently on Eliquis 2.5 mg twice daily; [interaction with voriconazole.].  Clinically STABLE.   #Infectious issues:  bilateral MAC infection [Dr. Levester Fresh, ID]; persistent MAC infection-s/p poor tolerance to second line therapy amikacin.  Right upper lobe cavitary lesion s/p drain placement.  Aspergillus-on voriconazole as per ID.  #Anemia secondary to iron deficiency Hb-8.2; ; HOLD PRBC transfusion.  Check iron studies today.  # # Patient is s/p Covid-19 x1. Needs to call- to set up home COVID shots.  Benefits of Covid vaccine outweigh the risk at any time; and strongly recommend COVID-19 vaccination.   # DISPOSITION:  check iron studies/ferritin today # HOLD PRBC transfusion today.  # Flu shost today # Venofer today # Follow up TBD- Dr.B  Addendum: October 2021 iron studies-saturation 7% ferritin 18.  Recommend Venofer x 3 more.   Cc: Drs. Kasa/Ravikrishnan/ Crystal/ Hedrick.     Orders Placed This Encounter  Procedures  . Ferritin    Standing Status:   Future    Number of Occurrences:   1    Standing Expiration Date:   07/29/2021  . Iron and TIBC    Standing Status:   Future    Number of Occurrences:   1    Standing Expiration Date:   07/29/2021   All questions were answered. The patient knows to call the clinic with any problems, questions or concerns.      Cammie Sickle, MD 08/02/2020 5:14 PM

## 2020-08-03 ENCOUNTER — Telehealth: Payer: Self-pay | Admitting: Internal Medicine

## 2020-08-03 NOTE — Telephone Encounter (Signed)
On 11/01-spoke to patient regarding results of iron studies; suggestive of iron deficiency.  Recommend IV iron infusion Venofer.  C-schedule for Venofer weekly x3.  Follow-up-MD; CBC possible venofer in second week of dec 2021.   Thanks GB

## 2020-08-03 NOTE — Telephone Encounter (Signed)
Cbc order already on chart and dated for December 2021

## 2020-08-12 ENCOUNTER — Inpatient Hospital Stay: Payer: PPO | Attending: Internal Medicine

## 2020-08-12 VITALS — BP 121/78 | HR 93 | Temp 97.7°F | Resp 20

## 2020-08-12 DIAGNOSIS — C3411 Malignant neoplasm of upper lobe, right bronchus or lung: Secondary | ICD-10-CM

## 2020-08-12 DIAGNOSIS — D509 Iron deficiency anemia, unspecified: Secondary | ICD-10-CM | POA: Diagnosis not present

## 2020-08-12 DIAGNOSIS — E876 Hypokalemia: Secondary | ICD-10-CM

## 2020-08-12 MED ORDER — SODIUM CHLORIDE 0.9% FLUSH
10.0000 mL | Freq: Once | INTRAVENOUS | Status: AC | PRN
  Administered 2020-08-12: 10 mL
  Filled 2020-08-12: qty 10

## 2020-08-12 MED ORDER — SODIUM CHLORIDE 0.9 % IV SOLN: 200.0000 mg | Freq: Once | INTRAVENOUS | Status: DC

## 2020-08-12 MED ORDER — HEPARIN SOD (PORK) LOCK FLUSH 100 UNIT/ML IV SOLN
INTRAVENOUS | Status: AC
  Filled 2020-08-12: qty 5

## 2020-08-12 MED ORDER — SODIUM CHLORIDE 0.9 % IV SOLN
Freq: Once | INTRAVENOUS | Status: AC
  Filled 2020-08-12: qty 250

## 2020-08-12 MED ORDER — IRON SUCROSE 20 MG/ML IV SOLN
200.0000 mg | Freq: Once | INTRAVENOUS | Status: AC
  Administered 2020-08-12: 200 mg via INTRAVENOUS
  Filled 2020-08-12: qty 10

## 2020-08-12 MED ORDER — HEPARIN SOD (PORK) LOCK FLUSH 100 UNIT/ML IV SOLN
500.0000 [IU] | Freq: Once | INTRAVENOUS | Status: AC | PRN
  Administered 2020-08-12: 500 [IU]
  Filled 2020-08-12: qty 5

## 2020-08-17 ENCOUNTER — Other Ambulatory Visit: Payer: Self-pay

## 2020-08-17 ENCOUNTER — Inpatient Hospital Stay: Payer: PPO

## 2020-08-17 VITALS — BP 105/74 | HR 103 | Temp 97.4°F | Resp 18

## 2020-08-17 DIAGNOSIS — E876 Hypokalemia: Secondary | ICD-10-CM

## 2020-08-17 DIAGNOSIS — D509 Iron deficiency anemia, unspecified: Secondary | ICD-10-CM | POA: Diagnosis not present

## 2020-08-17 DIAGNOSIS — C3411 Malignant neoplasm of upper lobe, right bronchus or lung: Secondary | ICD-10-CM

## 2020-08-17 MED ORDER — HEPARIN SOD (PORK) LOCK FLUSH 100 UNIT/ML IV SOLN
500.0000 [IU] | Freq: Once | INTRAVENOUS | Status: AC | PRN
  Administered 2020-08-17: 500 [IU]
  Filled 2020-08-17: qty 5

## 2020-08-17 MED ORDER — SODIUM CHLORIDE 0.9 % IV SOLN: 200.0000 mg | Freq: Once | INTRAVENOUS | Status: DC

## 2020-08-17 MED ORDER — HEPARIN SOD (PORK) LOCK FLUSH 100 UNIT/ML IV SOLN
INTRAVENOUS | Status: AC
  Filled 2020-08-17: qty 5

## 2020-08-17 MED ORDER — SODIUM CHLORIDE 0.9 % IV SOLN
Freq: Once | INTRAVENOUS | Status: AC
  Filled 2020-08-17: qty 250

## 2020-08-17 MED ORDER — IRON SUCROSE 20 MG/ML IV SOLN
200.0000 mg | Freq: Once | INTRAVENOUS | Status: AC
  Administered 2020-08-17: 200 mg via INTRAVENOUS
  Filled 2020-08-17: qty 10

## 2020-08-17 NOTE — Progress Notes (Signed)
Pt tolerated infusion well. No s/s of distress or reaction noted. Pt and VS stable at discharge.  

## 2020-08-24 ENCOUNTER — Other Ambulatory Visit: Payer: Self-pay

## 2020-08-24 ENCOUNTER — Inpatient Hospital Stay: Payer: PPO

## 2020-08-24 VITALS — BP 104/68 | HR 83 | Temp 97.1°F

## 2020-08-24 DIAGNOSIS — C3411 Malignant neoplasm of upper lobe, right bronchus or lung: Secondary | ICD-10-CM

## 2020-08-24 DIAGNOSIS — E876 Hypokalemia: Secondary | ICD-10-CM

## 2020-08-24 DIAGNOSIS — D509 Iron deficiency anemia, unspecified: Secondary | ICD-10-CM | POA: Diagnosis not present

## 2020-08-24 MED ORDER — SODIUM CHLORIDE 0.9 % IV SOLN: 200.0000 mg | Freq: Once | INTRAVENOUS | Status: DC

## 2020-08-24 MED ORDER — HEPARIN SOD (PORK) LOCK FLUSH 100 UNIT/ML IV SOLN
INTRAVENOUS | Status: AC
  Filled 2020-08-24: qty 5

## 2020-08-24 MED ORDER — HEPARIN SOD (PORK) LOCK FLUSH 100 UNIT/ML IV SOLN
500.0000 [IU] | Freq: Once | INTRAVENOUS | Status: AC | PRN
  Administered 2020-08-24: 500 [IU]
  Filled 2020-08-24: qty 5

## 2020-08-24 MED ORDER — IRON SUCROSE 20 MG/ML IV SOLN
200.0000 mg | Freq: Once | INTRAVENOUS | Status: AC
  Administered 2020-08-24: 200 mg via INTRAVENOUS
  Filled 2020-08-24: qty 10

## 2020-08-24 MED ORDER — SODIUM CHLORIDE 0.9 % IV SOLN
Freq: Once | INTRAVENOUS | Status: AC
  Filled 2020-08-24: qty 250

## 2020-08-24 NOTE — Progress Notes (Signed)
Pt tolerated Venofer infusion well with no signs of complications. RN educated pt on the importance of notifying the clinic if any complications occur at home, pt verbalized understanding and all questions answered at this time. VSS. Pt stable for discharge. Pt discharged home.   Claudia Chavez [employer]

## 2020-09-13 ENCOUNTER — Other Ambulatory Visit: Payer: Self-pay | Admitting: Internal Medicine

## 2020-09-15 ENCOUNTER — Inpatient Hospital Stay: Payer: PPO

## 2020-09-15 ENCOUNTER — Encounter: Payer: Self-pay | Admitting: Internal Medicine

## 2020-09-15 ENCOUNTER — Other Ambulatory Visit: Payer: Self-pay | Admitting: *Deleted

## 2020-09-15 ENCOUNTER — Inpatient Hospital Stay (HOSPITAL_BASED_OUTPATIENT_CLINIC_OR_DEPARTMENT_OTHER): Payer: PPO | Admitting: Internal Medicine

## 2020-09-15 ENCOUNTER — Inpatient Hospital Stay: Payer: PPO | Attending: Internal Medicine

## 2020-09-15 ENCOUNTER — Other Ambulatory Visit: Payer: Self-pay

## 2020-09-15 VITALS — BP 100/65 | HR 84

## 2020-09-15 DIAGNOSIS — C3411 Malignant neoplasm of upper lobe, right bronchus or lung: Secondary | ICD-10-CM

## 2020-09-15 DIAGNOSIS — D509 Iron deficiency anemia, unspecified: Secondary | ICD-10-CM | POA: Diagnosis not present

## 2020-09-15 DIAGNOSIS — E876 Hypokalemia: Secondary | ICD-10-CM

## 2020-09-15 LAB — SAMPLE TO BLOOD BANK

## 2020-09-15 LAB — CBC WITH DIFFERENTIAL/PLATELET
Abs Immature Granulocytes: 0.04 10*3/uL (ref 0.00–0.07)
Basophils Absolute: 0.1 10*3/uL (ref 0.0–0.1)
Basophils Relative: 1 %
Eosinophils Absolute: 0.2 10*3/uL (ref 0.0–0.5)
Eosinophils Relative: 2 %
HCT: 31.9 % — ABNORMAL LOW (ref 36.0–46.0)
Hemoglobin: 9.8 g/dL — ABNORMAL LOW (ref 12.0–15.0)
Immature Granulocytes: 0 %
Lymphocytes Relative: 18 %
Lymphs Abs: 1.6 10*3/uL (ref 0.7–4.0)
MCH: 26.7 pg (ref 26.0–34.0)
MCHC: 30.7 g/dL (ref 30.0–36.0)
MCV: 86.9 fL (ref 80.0–100.0)
Monocytes Absolute: 0.7 10*3/uL (ref 0.1–1.0)
Monocytes Relative: 8 %
Neutro Abs: 6.3 10*3/uL (ref 1.7–7.7)
Neutrophils Relative %: 71 %
Platelets: 470 10*3/uL — ABNORMAL HIGH (ref 150–400)
RBC: 3.67 MIL/uL — ABNORMAL LOW (ref 3.87–5.11)
RDW: 24.3 % — ABNORMAL HIGH (ref 11.5–15.5)
Smear Review: NORMAL
WBC: 8.9 10*3/uL (ref 4.0–10.5)
nRBC: 0 % (ref 0.0–0.2)

## 2020-09-15 MED ORDER — IRON SUCROSE 20 MG/ML IV SOLN
200.0000 mg | Freq: Once | INTRAVENOUS | Status: AC
  Administered 2020-09-15: 12:00:00 200 mg via INTRAVENOUS
  Filled 2020-09-15: qty 10

## 2020-09-15 MED ORDER — SODIUM CHLORIDE 0.9% FLUSH
10.0000 mL | Freq: Once | INTRAVENOUS | Status: AC
  Administered 2020-09-15: 11:00:00 10 mL via INTRAVENOUS
  Filled 2020-09-15: qty 10

## 2020-09-15 MED ORDER — SODIUM CHLORIDE 0.9 % IV SOLN
Freq: Once | INTRAVENOUS | Status: AC
  Filled 2020-09-15: qty 250

## 2020-09-15 MED ORDER — SODIUM CHLORIDE 0.9 % IV SOLN: 200.0000 mg | Freq: Once | INTRAVENOUS | Status: DC

## 2020-09-15 MED ORDER — HEPARIN SOD (PORK) LOCK FLUSH 100 UNIT/ML IV SOLN
500.0000 [IU] | Freq: Once | INTRAVENOUS | Status: AC | PRN
  Administered 2020-09-15: 13:00:00 500 [IU]
  Filled 2020-09-15: qty 5

## 2020-09-15 MED ORDER — HEPARIN SOD (PORK) LOCK FLUSH 100 UNIT/ML IV SOLN
INTRAVENOUS | Status: AC
  Filled 2020-09-15: qty 5

## 2020-09-15 NOTE — Progress Notes (Signed)
Pt tolerated infusion well with no signs of complications. VSS. Pt stable for discharge.   Claudia Chavez  

## 2020-09-15 NOTE — Assessment & Plan Note (Addendum)
#   Right upper lobe stage I lung cancer; s/p feb February 2019 SBRT-June 2021 CT scan [see below]-no obvious evidence of recurrence; Left lung stage III lung cancer-clinically  STABLE; will plan to get CT scan prior to next visit.  Ordered today.  #Bilateral lower extremity DVT[April 03, 2020]-currently on Eliquis 2.5 mg twice daily; [interaction with voriconazole.].  Stable.  #Infectious disease: Bilateral MAC/persistent-poor tolerance to amikacin; discontinued.  Right upper lobe cavitary lesion s/p drain-Aspergillus.  Could not afford voriconazole cost issues.  Patient however clinically doing well.  Await imaging as above.   #Anemia secondary to iron deficiency ? Etiology-hemoglobin today is 9.2.  Patient significantly improved post IV iron infusions.  Proceed with Venofer today.  # COVID vaccination- s/p 1; but needs to restart again.  Recommend patient take the course of Covid vaccination.  # DISPOSITION: # Venofer today # venofer  In 2 weeks # follow up in 6 weeks; MD; labs- cbc/bmp;CRP;  possible venofer; CT chest prior-- Dr.B  Cc: Drs. Kasa/Ravikrishnan/ Crystal/ Hedrick.

## 2020-09-15 NOTE — Progress Notes (Signed)
[place] OFFICE PROGRESS NOTE  Patient Care Team: [place] Pink, MD as PCP - General (Family Medicine) Cammie Sickle, MD as Medical Oncologist (Medical Oncology) Rockey Situ, Kathlene November, MD as Consulting Physician (Cardiology) Efrain Sella, MD as Consulting Physician (Gastroenterology) Noreene Filbert, MD as Referring Physician (Radiation Oncology)  Cancer Staging Malignant neoplasm of lung [facility]) Staging form: Lung, AJCC 7th Edition - Clinical: Stage IIIA (T3, N2, M0) - Signed by Forest Gleason, MD on 05/07/2015    Oncology History Overview Note  # Squamous cell carcinoma of right  LOWER LOBE OF  lung stage IIIA based on PET scan Biopsy of the (August of 2016). 2.  After 3 cycles of chemotherapy patient underwent resection of lung mass (November, 2016) [id number]   ypN0 [Dr.Oaks]  status post right lower lobectomy with a 3. She  was started on carboplatinum and Taxol on a weekly basis and radiation therapy from January of 2017 4. After 2 cycles of carboplatinum patient had neuropathy grade 2 interfering with work so chemotherapy was put on hold and radiation was continued (January 19th, 2017) 5.  Chemotherapy was discontinued because of progressive neuropathy.  Patient is finishing of radiation therapy on November 19, 2015  # NOV 2017- CT- 6m right hilar LN; PET- Feb 2018- NED.   # DEC-JAN 2019- RUL Lung nodule [s/p Bronch; Dr.Kasa- non-diagnostic] SBRT Feb 2019  #October 10, 2018 bronchoscopy [Dr.Kasa]-negative for malignancy/positive for MAC infection-ethambutol [Dr.Ravishankar]; [facility]- R LE DVT [xarelto]x STOP in end of dec 2019. Syncope- MRI- 13 mm cystic leision/asymptomatic/[facility]; Bil subacute cerebellar stroke;  AUG 2019 - GIB/anemia [EGD/colo- Dr.Toledo; NEG]; Aug 2019-  Severe hypokalemia- sec to Florinef-resolved.   DIAGNOSIS: RLL stage III lung ca; RUL stage I   GOALS: curative  CURRENT/MOST RECENT THERAPY :  Surveillaince    Malignant neoplasm of lung (HCC)  Malignant neoplasm of right upper lobe of lung (HCC)      INTERVAL HISTORY:  Claudia [id number].[id number].  female pleasant patient above history of stage III lung cancer; and also stage I right upper lobe status post radiation February 2019; history of MAC infection-poor tolerance to therapy; and also right lung abscess status post drain; question Aspergillus infection on voriconazole* and anemia secondary to chronic disease/iron deficiency is here for follow-up.  Patient has been started on IV iron infusions given the significant anemia needing PRBC transfusion.  Patient says iron infusion has been gained weight.  Energy levels improved.  She feels significantly better.  Patient did not start on voriconazole because of cost issues.  Otherwise no nausea no vomiting. Patient does not have any blood in stools or black or stools. Review of Systems  Constitutional: Positive for malaise/fatigue. Negative for chills, diaphoresis and fever.  HENT: Negative for nosebleeds and sore throat.   Eyes: Negative for double vision.  Respiratory: Positive for cough, sputum production and shortness of breath. Negative for hemoptysis and wheezing.   Cardiovascular: Negative for chest pain, palpitations, orthopnea and leg swelling.  Gastrointestinal: Negative for abdominal pain, blood in stool, constipation, diarrhea, heartburn, melena, nausea and vomiting.  Genitourinary: Negative for dysuria, frequency and urgency.  Musculoskeletal: Positive for back pain and joint pain.  Skin: Negative.  Negative for itching and rash.  Neurological: Negative for tingling, focal weakness, weakness and headaches.  Endo/Heme/Allergies: Does not bruise/bleed easily.  Psychiatric/Behavioral: Negative for depression. The patient is not nervous/anxious and does not have insomnia.  PAST MEDICAL HISTORY :  Past Medical History:  Diagnosis Date  . Cancer of lower lobe of  right lung ([place]) 05/07/2015  . COPD (chronic obstructive pulmonary disease) ([place])   . Dyspnea   . Non-small cell lung cancer ([place])   . Pneumonia 04/2015  . Pulmonary Mycobacterium avium complex (MAC) infection ([place]) 10/25/2018    PAST SURGICAL HISTORY :   Past Surgical History:  Procedure Laterality Date  . ABDOMINAL HYSTERECTOMY    . COLONOSCOPY WITH PROPOFOL N/A 06/09/2018   Procedure: COLONOSCOPY WITH PROPOFOL;  Surgeon: Toledo, Benay Pike, MD;  Location: [facility] ENDOSCOPY;  Service: Gastroenterology;  Laterality: N/A;  . ELBOW SURGERY Right 1995  . ELECTROMAGNETIC NAVIGATION BROCHOSCOPY N/A 10/25/2017   Procedure: ELECTROMAGNETIC NAVIGATION BRONCHOSCOPY;  Surgeon: [name], MD;  Location: [facility] ORS;  Service: Cardiopulmonary;  Laterality: N/A;  . ESOPHAGOGASTRODUODENOSCOPY N/A 06/08/2018   Procedure: ESOPHAGOGASTRODUODENOSCOPY (EGD);  Surgeon: Toledo, Benay Pike, MD;  Location: [facility] ENDOSCOPY;  Service: Gastroenterology;  Laterality: N/A;  . FLEXIBLE BRONCHOSCOPY Right 10/10/2018   Procedure: FLEXIBLE BRONCHOSCOPY;  Surgeon: [name], MD;  Location: [facility] ORS;  Service: Cardiopulmonary;  Laterality: Right;  . PORTACATH PLACEMENT Right 05/10/2015   Procedure: INSERTION PORT-A-CATH;  Surgeon: Nestor Lewandowsky, MD;  Location: [facility] ORS;  Service: General;  Laterality: Right;  Marland Kitchen VIDEO ASSISTED THORACOSCOPY (VATS)/THOROCOTOMY Right 08/18/2015   Procedure: VIDEO ASSISTED THORACOSCOPY (VATS)/THOROCOTOMY;  Surgeon: Nestor Lewandowsky, MD;  Location: [facility] ORS;  Service: General;  Laterality: Right;    FAMILY HISTORY :   Family History  Problem Relation Age of Onset  . Stroke Mother   . Lung cancer Father 77    SOCIAL HISTORY:   Social History   Tobacco Use  . Smoking status: Former Smoker    Packs/day: 0.50    Years: 40.00    Pack years: 20.00    Types: Cigarettes    Quit date: 12/01/2019    Years since quitting: 0.7  . Smokeless tobacco: Never Used  Vaping Use  . Vaping Use: Never used  Substance  Use Topics  . Alcohol use: Not Currently    Alcohol/week: 2.0 - 4.0 standard drinks    Types: 2 - 4 Cans of beer per week    Comment: beer-occasionally  . Drug use: No    ALLERGIES:  is allergic to keppra [levetiracetam], marinol [dronabinol], and lyrica [pregabalin].  MEDICATIONS:  Current Outpatient Medications  Medication Sig Dispense Refill  . albuterol (PROVENTIL HFA;VENTOLIN HFA) 108 (90 Base) MCG/ACT inhaler Inhale 2 puffs into the lungs every 6 (six) hours as needed for wheezing or shortness of breath.     . benzonatate (TESSALON) 100 MG capsule Take 1 capsule (100 mg total) by mouth 4 (four) times daily as needed for cough. 90 capsule 3  . Cholecalciferol (VITAMIN D3) 1000 units CAPS Take 2,000 Units by mouth daily.     Marland Kitchen ELIQUIS 2.5 MG TABS tablet TAKE 1 TABLET(2.5 MG) BY MOUTH TWICE DAILY 60 tablet 2  . Fluticasone-Salmeterol (ADVAIR) 250-50 MCG/DOSE AEPB Inhale 1 puff into the lungs every 12 (twelve) hours.     Marland Kitchen ipratropium-albuterol (DUONEB) 0.5-2.5 (3) MG/3ML SOLN Take 3 mLs by nebulization every 4 (four) hours as needed. 360 mL 3  . mirtazapine (REMERON) 15 MG tablet Take 1 tablet (15 mg total) by mouth at bedtime. 30 tablet 3  . ondansetron (ZOFRAN) 4 MG tablet Take 1 tablet (4 mg total) by mouth every 8 (eight) hours as needed for nausea or vomiting. 45 tablet 3  . Respiratory  Therapy Supplies (FLUTTER) DEVI 1 each by Does not apply route daily. 1 each 0  . SPIRIVA RESPIMAT 2.5 MCG/ACT AERS Inhale 2 puffs into the lungs daily.     . traMADol (ULTRAM) 50 MG tablet Take 1 tablet (50 mg total) by mouth every 12 (twelve) hours as needed for moderate pain or severe pain. 60 tablet 0  . vitamin B-12 (CYANOCOBALAMIN) 1000 MCG tablet Take 1,000 mcg by mouth daily.     No current facility-administered medications for this visit.   Facility-Administered Medications Ordered in Other Visits  Medication Dose Route Frequency Provider Last Rate Last Admin  . sodium chloride flush  (NS) 0.9 % injection 10 mL  10 mL Intravenous PRN Cammie Sickle, MD        PHYSICAL EXAMINATION: ECOG PERFORMANCE STATUS: 1 - Symptomatic but completely ambulatory  BP 112/77   Pulse 92   Temp (!) 95.6 F (35.3 C) (Tympanic)   Resp 16   Wt 82 lb (37.2 kg)   BMI 16.01 kg/m   Filed Weights   09/15/20 1121  Weight: 82 lb (37.2 kg)    Physical Exam Constitutional:      Comments: Cachectic appearing thin built Caucasian female patient.  She is alone.  She is ambulating independently.  HENT:     Head: Normocephalic and atraumatic.     Mouth/Throat:     Pharynx: No oropharyngeal exudate.  Eyes:     Pupils: Pupils are equal, round, and reactive to light.  Cardiovascular:     Rate and Rhythm: Normal rate and regular rhythm.  Pulmonary:     Effort: No respiratory distress.     Breath sounds: No wheezing.     Comments: Decreased air entry bilaterally right more than left.  Patient has a drain in the right chest wall. Abdominal:     General: Bowel sounds are normal. There is no distension.     Palpations: Abdomen is soft. There is no mass.     Tenderness: There is no abdominal tenderness. There is no guarding or rebound.  Musculoskeletal:        General: No tenderness. Normal range of motion.     Cervical back: Normal range of motion and neck supple.  Skin:    General: Skin is warm.  Neurological:     Mental Status: She is alert and oriented to person, place, and time.  Psychiatric:        Mood and Affect: Affect normal.    LABORATORY DATA:  I have reviewed the data as listed    Component Value Date/Time   NA 131 (L) 07/29/2020 0839   NA 137 07/30/2018 1245   K 4.4 07/29/2020 0839   CL 100 07/29/2020 0839   CO2 24 07/29/2020 0839   GLUCOSE 94 07/29/2020 0839   BUN 18 07/29/2020 0839   BUN 11 07/30/2018 1245   CREATININE 0.46 07/29/2020 0839   CALCIUM 8.2 (L) 07/29/2020 0839   PROT 8.2 (H) 07/29/2020 0839   ALBUMIN 2.4 (L) 07/29/2020 0839   AST 17  07/29/2020 0839   ALT 14 07/29/2020 0839   ALKPHOS 123 07/29/2020 0839   BILITOT 0.3 07/29/2020 0839   GFRNONAA >60 07/29/2020 0839   GFRAA >60 06/09/2020 1025    No results found for: SPEP, UPEP  Lab Results  Component Value Date   WBC 8.9 09/15/2020   NEUTROABS 6.3 09/15/2020   HGB 9.8 (L) 09/15/2020   HCT 31.9 (L) 09/15/2020   MCV 86.9 09/15/2020  PLT 470 (H) 09/15/2020      Chemistry      Component Value Date/Time   NA 131 (L) 07/29/2020 0839   NA 137 07/30/2018 1245   K 4.4 07/29/2020 0839   CL 100 07/29/2020 0839   CO2 24 07/29/2020 0839   BUN 18 07/29/2020 0839   BUN 11 07/30/2018 1245   CREATININE 0.46 07/29/2020 0839      Component Value Date/Time   CALCIUM 8.2 (L) 07/29/2020 0839   ALKPHOS 123 07/29/2020 0839   AST 17 07/29/2020 0839   ALT 14 07/29/2020 0839   BILITOT 0.3 07/29/2020 0839       RADIOGRAPHIC STUDIES: I have personally reviewed the radiological images as listed and agreed with the findings in the report. No results found.   ASSESSMENT & PLAN:  Malignant neoplasm of right upper lobe of lung [facility]) # Right upper lobe stage I lung cancer; s/p feb February 2019 SBRT-June 2021 CT scan [see below]-no obvious evidence of recurrence; Left lung stage III lung cancer-clinically  STABLE; will plan to get CT scan prior to next visit.  Ordered today.  #Bilateral lower extremity DVT[April 03, 2020]-currently on Eliquis 2.5 mg twice daily; [interaction with voriconazole.].  Stable.  #Infectious disease: Bilateral MAC/persistent-poor tolerance to amikacin; discontinued.  Right upper lobe cavitary lesion s/p drain-Aspergillus.  Could not afford voriconazole cost issues.  Patient however clinically doing well.  Await imaging as above.   #Anemia secondary to iron deficiency ? Etiology-hemoglobin today is 9.2.  Patient significantly improved post IV iron infusions.  Proceed with Venofer today.  # COVID vaccination- s/p 1; but needs to restart again.   Recommend patient take the course of Covid vaccination.  # DISPOSITION: # Venofer today # venofer  In 2 weeks # follow up in 6 weeks; MD; labs- cbc/bmp;CRP;  possible venofer; CT chest prior-- Dr.B  Cc: Drs. Kasa/Ravikrishnan/ [place]/ [place].     Orders Placed This Encounter  Procedures  . CT Chest W Contrast    Standing Status:   Future    Standing Expiration Date:   09/15/2021    Order Specific Question:   If indicated for the ordered procedure, I authorize the administration of contrast media per Radiology protocol    Answer:   Yes    Order Specific Question:   Preferred imaging location?    Answer:   [facility]  . CBC with Differential    Standing Status:   Future    Standing Expiration Date:   09/15/2021  . Basic metabolic panel    Standing Status:   Future    Standing Expiration Date:   09/15/2021  . C-reactive protein    Standing Status:   Future    Standing Expiration Date:   09/15/2021   All questions were answered. The patient knows to call the clinic with any problems, questions or concerns.      Cammie Sickle, MD 09/15/2020 2:39 PM

## 2020-09-17 ENCOUNTER — Other Ambulatory Visit: Payer: Self-pay | Admitting: *Deleted

## 2020-09-17 MED ORDER — BENZONATATE 100 MG PO CAPS: 100.0000 mg | ORAL_CAPSULE | Freq: Four times a day (QID) | ORAL | 3 refills | Status: DC | PRN

## 2020-09-17 NOTE — Telephone Encounter (Signed)
Receiving incoming fax from Pharmacy - request for RF on Tessalon

## 2020-09-29 ENCOUNTER — Inpatient Hospital Stay: Payer: PPO

## 2020-09-29 VITALS — BP 110/65 | HR 80 | Temp 96.7°F

## 2020-09-29 DIAGNOSIS — E876 Hypokalemia: Secondary | ICD-10-CM

## 2020-09-29 DIAGNOSIS — C3411 Malignant neoplasm of upper lobe, right bronchus or lung: Secondary | ICD-10-CM

## 2020-09-29 DIAGNOSIS — D509 Iron deficiency anemia, unspecified: Secondary | ICD-10-CM | POA: Diagnosis not present

## 2020-09-29 MED ORDER — HEPARIN SOD (PORK) LOCK FLUSH 100 UNIT/ML IV SOLN
500.0000 [IU] | Freq: Once | INTRAVENOUS | Status: AC
  Administered 2020-09-29: 14:00:00 500 [IU]
  Filled 2020-09-29: qty 5

## 2020-09-29 MED ORDER — IRON SUCROSE 20 MG/ML IV SOLN
200.0000 mg | Freq: Once | INTRAVENOUS | Status: AC
  Administered 2020-09-29: 14:00:00 200 mg via INTRAVENOUS
  Filled 2020-09-29: qty 10

## 2020-09-29 MED ORDER — SODIUM CHLORIDE 0.9 % IV SOLN: 200.0000 mg | Freq: Once | INTRAVENOUS | Status: DC

## 2020-09-29 MED ORDER — HEPARIN SOD (PORK) LOCK FLUSH 100 UNIT/ML IV SOLN
INTRAVENOUS | Status: AC
  Filled 2020-09-29: qty 5

## 2020-09-29 MED ORDER — SODIUM CHLORIDE 0.9 % IV SOLN
Freq: Once | INTRAVENOUS | Status: AC
  Filled 2020-09-29: qty 250

## 2020-10-21 DIAGNOSIS — Z0000 Encounter for general adult medical examination without abnormal findings: Secondary | ICD-10-CM | POA: Diagnosis not present

## 2020-10-25 ENCOUNTER — Ambulatory Visit
Admission: RE | Admit: 2020-10-25 | Discharge: 2020-10-25 | Disposition: A | Discharge status: Home / Self Care | Payer: PPO | Source: Ambulatory Visit | Attending: Internal Medicine | Admitting: Internal Medicine

## 2020-10-25 ENCOUNTER — Other Ambulatory Visit: Payer: Self-pay

## 2020-10-25 DIAGNOSIS — J984 Other disorders of lung: Secondary | ICD-10-CM | POA: Diagnosis not present

## 2020-10-25 DIAGNOSIS — C3490 Malignant neoplasm of unspecified part of unspecified bronchus or lung: Secondary | ICD-10-CM | POA: Diagnosis not present

## 2020-10-25 DIAGNOSIS — J439 Emphysema, unspecified: Secondary | ICD-10-CM | POA: Diagnosis not present

## 2020-10-25 DIAGNOSIS — C3411 Malignant neoplasm of upper lobe, right bronchus or lung: Secondary | ICD-10-CM | POA: Diagnosis not present

## 2020-10-25 DIAGNOSIS — I2510 Atherosclerotic heart disease of native coronary artery without angina pectoris: Secondary | ICD-10-CM | POA: Diagnosis not present

## 2020-10-25 LAB — POCT I-STAT CREATININE: Creatinine, Ser: 0.6 mg/dL (ref 0.44–1.00)

## 2020-10-25 MED ORDER — IOHEXOL 300 MG/ML  SOLN
50.0000 mL | Freq: Once | INTRAMUSCULAR | Status: AC | PRN
  Administered 2020-10-25: 50 mL via INTRAVENOUS

## 2020-10-27 ENCOUNTER — Inpatient Hospital Stay (HOSPITAL_BASED_OUTPATIENT_CLINIC_OR_DEPARTMENT_OTHER): Payer: PPO | Admitting: Internal Medicine

## 2020-10-27 ENCOUNTER — Inpatient Hospital Stay: Payer: PPO

## 2020-10-27 ENCOUNTER — Other Ambulatory Visit: Payer: Self-pay

## 2020-10-27 ENCOUNTER — Inpatient Hospital Stay: Payer: PPO | Attending: Internal Medicine

## 2020-10-27 VITALS — BP 113/69 | HR 100

## 2020-10-27 DIAGNOSIS — C3411 Malignant neoplasm of upper lobe, right bronchus or lung: Secondary | ICD-10-CM

## 2020-10-27 DIAGNOSIS — D509 Iron deficiency anemia, unspecified: Secondary | ICD-10-CM | POA: Diagnosis not present

## 2020-10-27 DIAGNOSIS — E876 Hypokalemia: Secondary | ICD-10-CM

## 2020-10-27 LAB — CBC WITH DIFFERENTIAL/PLATELET
Abs Immature Granulocytes: 0.03 10*3/uL (ref 0.00–0.07)
Basophils Absolute: 0.1 10*3/uL (ref 0.0–0.1)
Basophils Relative: 1 %
Eosinophils Absolute: 0.2 10*3/uL (ref 0.0–0.5)
Eosinophils Relative: 2 %
HCT: 32.1 % — ABNORMAL LOW (ref 36.0–46.0)
Hemoglobin: 10.4 g/dL — ABNORMAL LOW (ref 12.0–15.0)
Immature Granulocytes: 0 %
Lymphocytes Relative: 18 %
Lymphs Abs: 1.5 10*3/uL (ref 0.7–4.0)
MCH: 29.5 pg (ref 26.0–34.0)
MCHC: 32.4 g/dL (ref 30.0–36.0)
MCV: 91.2 fL (ref 80.0–100.0)
Monocytes Absolute: 0.8 10*3/uL (ref 0.1–1.0)
Monocytes Relative: 9 %
Neutro Abs: 6 10*3/uL (ref 1.7–7.7)
Neutrophils Relative %: 70 %
Platelets: 487 10*3/uL — ABNORMAL HIGH (ref 150–400)
RBC: 3.52 MIL/uL — ABNORMAL LOW (ref 3.87–5.11)
RDW: 18.7 % — ABNORMAL HIGH (ref 11.5–15.5)
Smear Review: NORMAL
WBC: 8.6 10*3/uL (ref 4.0–10.5)
nRBC: 0 % (ref 0.0–0.2)

## 2020-10-27 LAB — BASIC METABOLIC PANEL
Anion gap: 7 (ref 5–15)
BUN: 13 mg/dL (ref 8–23)
CO2: 26 mmol/L (ref 22–32)
Calcium: 8.6 mg/dL — ABNORMAL LOW (ref 8.9–10.3)
Chloride: 99 mmol/L (ref 98–111)
Creatinine, Ser: 0.41 mg/dL — ABNORMAL LOW (ref 0.44–1.00)
GFR, Estimated: >60 mL/min (ref >60)
Glucose, Bld: 99 mg/dL (ref 70–99)
Potassium: 4 mmol/L (ref 3.5–5.1)
Sodium: 132 mmol/L — ABNORMAL LOW (ref 135–145)

## 2020-10-27 LAB — C-REACTIVE PROTEIN: CRP: 8.1 mg/dL — ABNORMAL HIGH (ref <1.0)

## 2020-10-27 MED ORDER — HEPARIN SOD (PORK) LOCK FLUSH 100 UNIT/ML IV SOLN
500.0000 [IU] | Freq: Once | INTRAVENOUS | Status: AC
  Administered 2020-10-27: 500 [IU] via INTRAVENOUS
  Filled 2020-10-27: qty 5

## 2020-10-27 MED ORDER — SODIUM CHLORIDE 0.9 % IV SOLN
Freq: Once | INTRAVENOUS | Status: AC
  Filled 2020-10-27: qty 250

## 2020-10-27 MED ORDER — IRON SUCROSE 20 MG/ML IV SOLN
200.0000 mg | Freq: Once | INTRAVENOUS | Status: AC
  Administered 2020-10-27: 200 mg via INTRAVENOUS
  Filled 2020-10-27: qty 10

## 2020-10-27 MED ORDER — HYDROCOD POLST-CPM POLST ER 10-8 MG/5ML PO SUER
5.0000 mL | Freq: Every evening | ORAL | 0 refills | Status: DC | PRN
Start: 2020-10-27 — End: 2020-12-08

## 2020-10-27 MED ORDER — HEPARIN SOD (PORK) LOCK FLUSH 100 UNIT/ML IV SOLN
INTRAVENOUS | Status: AC
  Filled 2020-10-27: qty 5

## 2020-10-27 MED ORDER — SODIUM CHLORIDE 0.9 % IV SOLN: 200.0000 mg | Freq: Once | INTRAVENOUS | Status: DC

## 2020-10-27 NOTE — Progress Notes (Signed)
[name] OFFICE PROGRESS NOTE  Patient Care Team: Maryland Pink, MD as PCP - General (Family Medicine) Cammie Sickle, MD as Medical Oncologist (Medical Oncology) Rockey Situ, Kathlene November, MD as Consulting Physician (Cardiology) Efrain Sella, MD as Consulting Physician (Gastroenterology) Noreene Filbert, MD as Referring Physician (Radiation Oncology)  Cancer Staging Malignant neoplasm of lung [facility]) Staging form: Lung, AJCC 7th Edition - Clinical: Stage IIIA (T3, N2, M0) - Signed by Forest Gleason, MD on 05/07/2015 Laterality: Right    Oncology History Overview Note  # Squamous cell carcinoma of right  LOWER LOBE OF  lung stage IIIA based on PET scan Biopsy of the (August of 2016). 2.  After 3 cycles of chemotherapy patient underwent resection of lung mass (November, 2016) ypT2B   ypN0 [Dr.Oaks]  status post right lower lobectomy with a 3. She  was started on carboplatinum and Taxol on a weekly basis and radiation therapy from January of 2017 4. After 2 cycles of carboplatinum patient had neuropathy grade 2 interfering with work so chemotherapy was put on hold and radiation was continued (January 19th, 2017) 5.  Chemotherapy was discontinued because of progressive neuropathy.  Patient is finishing of radiation therapy on November 19, 2015  # NOV 2017- CT- 53m right hilar LN; PET- Feb 2018- NED.   # DEC-JAN 2019- RUL Lung nodule [s/p Bronch; Dr.Kasa- non-diagnostic] SBRT Feb 2019  #October 10, 2018 bronchoscopy [Dr.Kasa]-negative for malignancy/positive for MAC infection-ethambutol [Dr.Ravishankar]; [facility]- R LE DVT [xarelto]x STOP in end of dec 2019. Syncope- MRI- 13 mm cystic leision/asymptomatic/[facility]; Bil subacute cerebellar stroke;  AUG 2019 - GIB/anemia [EGD/colo- Dr.Toledo; NEG]; Aug 2019-  Severe hypokalemia- sec to Florinef-resolved.   DIAGNOSIS: RLL stage III lung ca; RUL stage I   GOALS:  curative  CURRENT/MOST RECENT THERAPY : Surveillaince    Malignant neoplasm of lung (HCC)  Malignant neoplasm of right upper lobe of lung (HCC)      INTERVAL HISTORY:  Claudia EDRINGTON89y.o.  female pleasant patient above history of stage III lung cancer; and also stage I right upper lobe status post radiation February 2019; history of MAC infection-poor tolerance to therapy; and also right lung abscess status post drain; question Aspergillus infection on voriconazole* and anemia secondary to chronic disease/iron deficiency is here for follow-up/review results of the CT scan.  Patient continues to complain of fatigue.  Shortness of breath on exertion.  Dry cough.  No blood in stools or black-colored stools.  No nausea no vomiting.   Review of Systems  Constitutional: Positive for malaise/fatigue. Negative for chills, diaphoresis and fever.  HENT: Negative for nosebleeds and sore throat.   Eyes: Negative for double vision.  Respiratory: Positive for cough, sputum production and shortness of breath. Negative for hemoptysis and wheezing.   Cardiovascular: Negative for chest pain, palpitations, orthopnea and leg swelling.  Gastrointestinal: Negative for abdominal pain, blood in stool, constipation, diarrhea, heartburn, melena, nausea and vomiting.  Genitourinary: Negative for dysuria, frequency and urgency.  Musculoskeletal: Positive for back pain and joint pain.  Skin: Negative.  Negative for itching and rash.  Neurological: Negative for tingling, focal weakness, weakness and headaches.  Endo/Heme/Allergies: Does not bruise/bleed easily.  Psychiatric/Behavioral: Negative for depression. The patient is not nervous/anxious and does not have insomnia.       PAST MEDICAL HISTORY :  Past Medical History:  Diagnosis Date  . [place]  . COPD (  chronic obstructive pulmonary disease) ([place])   . Dyspnea   . Non-small cell lung cancer ([place])   .  Pneumonia 04/2015  . Pulmonary Mycobacterium avium complex (MAC) infection ([place]) 10/25/2018    PAST SURGICAL HISTORY :   Past Surgical History:  Procedure Laterality Date  . ABDOMINAL HYSTERECTOMY    . COLONOSCOPY WITH PROPOFOL N/A 06/09/2018   Procedure: COLONOSCOPY WITH PROPOFOL;  Surgeon: Toledo, Benay Pike, MD;  Location: [facility] ENDOSCOPY;  Service: Gastroenterology;  Laterality: N/A;  . ELBOW SURGERY Right 1995  . ELECTROMAGNETIC NAVIGATION BROCHOSCOPY N/A 10/25/2017   Procedure: ELECTROMAGNETIC NAVIGATION BRONCHOSCOPY;  Surgeon: [name], MD;  Location: [facility] ORS;  Service: Cardiopulmonary;  Laterality: N/A;  . ESOPHAGOGASTRODUODENOSCOPY N/A 06/08/2018   Procedure: ESOPHAGOGASTRODUODENOSCOPY (EGD);  Surgeon: Toledo, Benay Pike, MD;  Location: [facility] ENDOSCOPY;  Service: Gastroenterology;  Laterality: N/A;  . FLEXIBLE BRONCHOSCOPY Right 10/10/2018   Procedure: FLEXIBLE BRONCHOSCOPY;  Surgeon: [name], MD;  Location: [facility] ORS;  Service: Cardiopulmonary;  Laterality: Right;  . PORTACATH PLACEMENT Right 05/10/2015   Procedure: INSERTION PORT-A-CATH;  Surgeon: Nestor Lewandowsky, MD;  Location: [facility] ORS;  Service: General;  Laterality: Right;  Marland Kitchen VIDEO ASSISTED THORACOSCOPY (VATS)/THOROCOTOMY Right 08/18/2015   Procedure: VIDEO ASSISTED THORACOSCOPY (VATS)/THOROCOTOMY;  Surgeon: Nestor Lewandowsky, MD;  Location: [facility] ORS;  Service: General;  Laterality: Right;    FAMILY HISTORY :   Family History  Problem Relation Age of Onset  . Stroke Mother   . Lung cancer Father 54    SOCIAL HISTORY:   Social History   Tobacco Use  . Smoking status: Former Smoker    Packs/day: 0.50    Years: 40.00    Pack years: 20.00    Types: Cigarettes    Quit date: 12/01/2019    Years since quitting: 0.9  . Smokeless tobacco: Never Used  Vaping Use  . Vaping Use: Never used  Substance Use Topics  . Alcohol use: Not Currently    Alcohol/week: 2.0 - 4.0 standard drinks    Types: 2 - 4 Cans of beer per week    Comment:  beer-occasionally  . Drug use: No    ALLERGIES:  is allergic to keppra [levetiracetam], marinol [dronabinol], and lyrica [pregabalin].  MEDICATIONS:  Current Outpatient Medications  Medication Sig Dispense Refill  . albuterol (PROVENTIL HFA;VENTOLIN HFA) 108 (90 Base) MCG/ACT inhaler Inhale 2 puffs into the lungs every 6 (six) hours as needed for wheezing or shortness of breath.     . benzonatate (TESSALON) 100 MG capsule Take 1 capsule (100 mg total) by mouth 4 (four) times daily as needed for cough. 90 capsule 3  . chlorpheniramine-HYDROcodone (TUSSIONEX) 10-8 MG/5ML SUER Take 5 mLs by mouth at bedtime as needed for cough. 140 mL 0  . Cholecalciferol (VITAMIN D3) 1000 units CAPS Take 2,000 Units by mouth daily.     Marland Kitchen ELIQUIS 2.5 MG TABS tablet TAKE 1 TABLET(2.5 MG) BY MOUTH TWICE DAILY 60 tablet 2  . Fluticasone-Salmeterol (ADVAIR) 250-50 MCG/DOSE AEPB Inhale 1 puff into the lungs every 12 (twelve) hours.     Marland Kitchen ipratropium-albuterol (DUONEB) 0.5-2.5 (3) MG/3ML SOLN Take 3 mLs by nebulization every 4 (four) hours as needed. 360 mL 3  . mirtazapine (REMERON) 15 MG tablet Take 1 tablet (15 mg total) by mouth at bedtime. 30 tablet 3  . ondansetron (ZOFRAN) 4 MG tablet Take 1 tablet (4 mg total) by mouth every 8 (eight) hours as needed for nausea or vomiting. 45 tablet 3  . Respiratory Therapy Supplies (FLUTTER) DEVI  1 each by Does not apply route daily. 1 each 0  . SPIRIVA RESPIMAT 2.5 MCG/ACT AERS Inhale 2 puffs into the lungs daily.     . traMADol (ULTRAM) 50 MG tablet Take 1 tablet (50 mg total) by mouth every 12 (twelve) hours as needed for moderate pain or severe pain. 60 tablet 0  . vitamin B-12 (CYANOCOBALAMIN) 1000 MCG tablet Take 1,000 mcg by mouth daily.     No current facility-administered medications for this visit.   Facility-Administered Medications Ordered in Other Visits  Medication Dose Route Frequency Provider Last Rate Last Admin  . sodium chloride flush (NS) 0.9 %  injection 10 mL  10 mL Intravenous PRN Cammie Sickle, MD        PHYSICAL EXAMINATION: ECOG PERFORMANCE STATUS: 1 - Symptomatic but completely ambulatory  BP 128/76   Pulse (!) 101   Temp (!) 95.1 F (35.1 C) (Tympanic)   Resp 18   Wt 84 lb 12.8 oz (38.5 kg)   BMI 16.56 kg/m   Filed Weights   10/27/20 1110  Weight: 84 lb 12.8 oz (38.5 kg)    Physical Exam Constitutional:      Comments: Cachectic appearing thin built Caucasian female patient.  She is alone.  She is ambulating independently.  HENT:     Head: Normocephalic and atraumatic.     Mouth/Throat:     Pharynx: No oropharyngeal exudate.  Eyes:     Pupils: Pupils are equal, round, and reactive to light.  Cardiovascular:     Rate and Rhythm: Normal rate and regular rhythm.  Pulmonary:     Effort: No respiratory distress.     Breath sounds: No wheezing.     Comments: Decreased air entry bilaterally right more than left.  Patient has a drain in the right chest wall. Abdominal:     General: Bowel sounds are normal. There is no distension.     Palpations: Abdomen is soft. There is no mass.     Tenderness: There is no abdominal tenderness. There is no guarding or rebound.  Musculoskeletal:        General: No tenderness. Normal range of motion.     Cervical back: Normal range of motion and neck supple.  Skin:    General: Skin is warm.  Neurological:     Mental Status: She is alert and oriented to person, place, and time.  Psychiatric:        Mood and Affect: Affect normal.    LABORATORY DATA:  I have reviewed the data as listed    Component Value Date/Time   NA 132 (L) 10/27/2020 1039   NA 137 07/30/2018 1245   K 4.0 10/27/2020 1039   CL 99 10/27/2020 1039   CO2 26 10/27/2020 1039   GLUCOSE 99 10/27/2020 1039   BUN 13 10/27/2020 1039   BUN 11 07/30/2018 1245   CREATININE 0.41 (L) 10/27/2020 1039   CALCIUM 8.6 (L) 10/27/2020 1039   PROT 8.2 (H) 07/29/2020 0839   ALBUMIN 2.4 (L) 07/29/2020 0839    AST 17 07/29/2020 0839   ALT 14 07/29/2020 0839   ALKPHOS 123 07/29/2020 0839   BILITOT 0.3 07/29/2020 0839   GFRNONAA >60 10/27/2020 1039   GFRAA >60 06/09/2020 1025    No results found for: SPEP, UPEP  Lab Results  Component Value Date   WBC 8.6 10/27/2020   NEUTROABS 6.0 10/27/2020   HGB 10.4 (L) 10/27/2020   HCT 32.1 (L) 10/27/2020   MCV 91.2 10/27/2020  PLT 487 (H) 10/27/2020      Chemistry      Component Value Date/Time   NA 132 (L) 10/27/2020 1039   NA 137 07/30/2018 1245   K 4.0 10/27/2020 1039   CL 99 10/27/2020 1039   CO2 26 10/27/2020 1039   BUN 13 10/27/2020 1039   BUN 11 07/30/2018 1245   CREATININE 0.41 (L) 10/27/2020 1039      Component Value Date/Time   CALCIUM 8.6 (L) 10/27/2020 1039   ALKPHOS 123 07/29/2020 0839   AST 17 07/29/2020 0839   ALT 14 07/29/2020 0839   BILITOT 0.3 07/29/2020 0839       RADIOGRAPHIC STUDIES: I have personally reviewed the radiological images as listed and agreed with the findings in the report. No results found.   ASSESSMENT & PLAN:  Malignant neoplasm of right upper lobe of lung ([place]) # Right upper lobe stage I lung cancer; s/p feb February 2019 SBRT; Left lung stage III lung cancer- JAN 24th, 2022-collapse/consolidative opacity right base progressive; progression of right hilar/mediastinal lymphadenopathy-reactive versus metastatic disease.  Right apical posterior-cavitary lesion/fluid collection improved-s/p drain [see below]  #Discussed with the patient the findings of the CT scan.  Unclear if patient has progressive malignancy/infection [see below]. Will discuss with pulmonary/ and ID.   #Bilateral lower extremity DVT[April 03, 2020]-currently on Eliquis 2.5 mg twice daily; [interaction with voriconazole.].   STABLE.   #Infectious disease: Bilateral MAC/persistent-poor tolerance to amikacin; discontinued.  Right upper lobe cavitary lesion s/p drain-Aspergillus.  Could not afford voriconazole cost issues.  Patient  however clinically doing well. Imaging as above. Will discuss with IS/Pulmonary.   #Anemia secondary to iron deficiency ? Etiology-hemoglobin today is 10.2;.  Patient significantly improved post IV iron infusions.  Proceed with Venofer today.  # COVID vaccination- s/p 1; but needs to restart again.again counseled recommend patient take the course of Covid vaccination.  # DISPOSITION: # Venofer today # follow up in 6 weeks; MD; labs- cbc/bmp;CRP;  possible venofer- Dr.B.  Cc: Drs. Kasa/Ravikrishnan/ Crystal/ Hedrick.     No orders of the defined types were placed in this encounter.  All questions were answered. The patient knows to call the clinic with any problems, questions or concerns.      Cammie Sickle, MD 11/07/2020 8:46 AM

## 2020-10-27 NOTE — Progress Notes (Signed)
Patient stable at discharge.

## 2020-10-27 NOTE — Assessment & Plan Note (Addendum)
#   Right upper lobe stage I lung cancer; s/p feb February 2019 SBRT; Left lung stage III lung cancer- JAN 24th, 2022-collapse/consolidative opacity right base progressive; progression of right hilar/mediastinal lymphadenopathy-reactive versus metastatic disease.  Right apical posterior-cavitary lesion/fluid collection improved-s/p drain [see below]  #Discussed with the patient the findings of the CT scan.  Unclear if patient has progressive malignancy/infection [see below]. Will discuss with pulmonary/ and ID.   #Bilateral lower extremity DVT[April 03, 2020]-currently on Eliquis 2.5 mg twice daily; [interaction with voriconazole.].   STABLE.   #Infectious disease: Bilateral MAC/persistent-poor tolerance to amikacin; discontinued.  Right upper lobe cavitary lesion s/p drain-Aspergillus.  Could not afford voriconazole cost issues.  Patient however clinically doing well. Imaging as above. Will discuss with IS/Pulmonary.   #Anemia secondary to iron deficiency ? Etiology-hemoglobin today is 10.2;.  Patient significantly improved post IV iron infusions.  Proceed with Venofer today.  # COVID vaccination- s/p 1; but needs to restart again.again counseled recommend patient take the course of Covid vaccination.  # DISPOSITION: # Venofer today # follow up in 6 weeks; MD; labs- cbc/bmp;CRP;  possible venofer- Dr.B.  Cc: Drs. Kasa/Ravikrishnan/ Crystal/ Hedrick.

## 2020-11-05 ENCOUNTER — Ambulatory Visit
Admission: RE | Admit: 2020-11-05 | Discharge: 2020-11-05 | Disposition: A | Discharge status: Home / Self Care | Payer: PPO | Source: Ambulatory Visit | Attending: Radiation Oncology | Admitting: Radiation Oncology

## 2020-11-05 ENCOUNTER — Encounter: Payer: Self-pay | Admitting: Radiation Oncology

## 2020-11-05 DIAGNOSIS — F17210 Nicotine dependence, cigarettes, uncomplicated: Secondary | ICD-10-CM | POA: Diagnosis not present

## 2020-11-05 DIAGNOSIS — C3411 Malignant neoplasm of upper lobe, right bronchus or lung: Secondary | ICD-10-CM

## 2020-11-05 DIAGNOSIS — C7801 Secondary malignant neoplasm of right lung: Secondary | ICD-10-CM | POA: Diagnosis not present

## 2020-11-05 DIAGNOSIS — Z85118 Personal history of other malignant neoplasm of bronchus and lung: Secondary | ICD-10-CM | POA: Diagnosis not present

## 2020-11-05 DIAGNOSIS — Z08 Encounter for follow-up examination after completed treatment for malignant neoplasm: Secondary | ICD-10-CM | POA: Diagnosis not present

## 2020-11-05 NOTE — Progress Notes (Signed)
Radiation Oncology Follow up Note  Name: Claudia Chavez   Date:   11/05/2020 MRN:  [id number] DOB: [birth year]    This 65 y.o. female presents to the clinic today for 2-year ollow-up status post SBRT to her right upper lobe as well as concurrent chemoradiation therapy for stage IIIa carcinoma the right lower lobe.  REFERRING PROVIDER: Maryland Pink, MD  HPI: Patient is a 65 year old female now seen out.  2 years having completed SBRT to her right upper lobe she also 5 years ago had concurrent chemoradiation therapy for stage IIIa carcinoma the right lower lobe.  Seen today in routine follow-up she is doing fairly well.  She feels quite weak today although specifically [place] cough hemoptysis or dysphagia.  Recent CT scan this month showed a cavitary lesion right apex similar to prior studies although the fluid collection seen in previously had decrease status post percutaneous drain placement she has not consolidative collapse of the right base.  She has slight interval progression of right hilar mediastinal lymphadenopathy although it is minimal in my review.  She is being followed by medical oncology for anemia secondary to iron deficiency she also had by lower extremity DVTs back in July and is currently on Eliquis.  Dr. B is continuing to follow her CT scan progression.  COMPLICATIONS OF TREATMENT: none  FOLLOW UP COMPLIANCE: keeps appointments   PHYSICAL EXAM:  BP (P) 137/83 (BP Location: Left Arm, Patient Position: Sitting)   Pulse (!) (P) 108   Temp (!) (P) 96 F (35.6 C) (Tympanic)   Resp (P) 20   Wt (P) 83 lb 8 oz (37.9 kg)   BMI (P) 16.31 kg/m  Thin appearing female in NAD.  Well-developed well-nourished patient in NAD. HEENT reveals PERLA, EOMI, discs not visualized.  Oral cavity is clear. No oral mucosal lesions are identified. Neck is clear without evidence of cervical or supraclavicular adenopathy. Lungs are clear to A&P. Cardiac examination is essentially unremarkable with  regular rate and rhythm without murmur rub or thrill. Abdomen is benign with no organomegaly or masses noted. Motor sensory and DTR levels are equal and symmetric in the upper and lower extremities. Cranial nerves II through XII are grossly intact. Proprioception is intact. No peripheral adenopathy or edema is identified. No motor or sensory levels are noted. Crude visual fields are within normal range.  RADIOLOGY RESULTS: CT scan reviewed compatible with above-stated findings  PLAN: Present time patient is stable currently under care of medical oncology.  I have reviewed her CT scans do not see significant appreciable difference from her prior studies.  I have asked to see her back in 1 year for follow-up.  Should her CT scans progress would recommend PET/CT imaging.  We will be happy to reevaluate the patient anytime should further consultation be indicated.  Patient "knows to call with any concerns.  I would like to take this opportunity to thank you for allowing me to participate in the care of your patient.Noreene Filbert, MD

## 2020-11-08 ENCOUNTER — Other Ambulatory Visit: Payer: Self-pay | Admitting: Internal Medicine

## 2020-11-16 ENCOUNTER — Telehealth: Payer: Self-pay | Admitting: Cardiovascular Disease

## 2020-11-16 NOTE — Telephone Encounter (Signed)
Per patient request deleting recall

## 2020-12-08 ENCOUNTER — Other Ambulatory Visit: Payer: Self-pay

## 2020-12-08 ENCOUNTER — Other Ambulatory Visit: Payer: Self-pay | Admitting: *Deleted

## 2020-12-08 ENCOUNTER — Inpatient Hospital Stay: Payer: PPO

## 2020-12-08 ENCOUNTER — Inpatient Hospital Stay: Payer: PPO | Attending: Internal Medicine

## 2020-12-08 ENCOUNTER — Encounter: Payer: Self-pay | Admitting: Internal Medicine

## 2020-12-08 ENCOUNTER — Inpatient Hospital Stay (HOSPITAL_BASED_OUTPATIENT_CLINIC_OR_DEPARTMENT_OTHER): Payer: PPO | Admitting: Internal Medicine

## 2020-12-08 ENCOUNTER — Telehealth: Payer: Self-pay | Admitting: *Deleted

## 2020-12-08 VITALS — BP 99/66 | HR 90 | Temp 97.2°F | Resp 17

## 2020-12-08 DIAGNOSIS — D509 Iron deficiency anemia, unspecified: Secondary | ICD-10-CM | POA: Diagnosis not present

## 2020-12-08 DIAGNOSIS — C3411 Malignant neoplasm of upper lobe, right bronchus or lung: Secondary | ICD-10-CM

## 2020-12-08 DIAGNOSIS — E876 Hypokalemia: Secondary | ICD-10-CM

## 2020-12-08 LAB — CBC WITH DIFFERENTIAL/PLATELET
Basophils Absolute: 0.1 10*3/uL (ref 0.0–0.1)
Basophils Relative: 1 %
Eosinophils Absolute: 0.2 10*3/uL (ref 0.0–0.5)
Eosinophils Relative: 2 %
HCT: 33.2 % — ABNORMAL LOW (ref 36.0–46.0)
Hemoglobin: 10.9 g/dL — ABNORMAL LOW (ref 12.0–15.0)
Lymphocytes Relative: 15 %
Lymphs Abs: 1.4 10*3/uL (ref 0.7–4.0)
MCH: 31.4 pg (ref 26.0–34.0)
MCHC: 32.8 g/dL (ref 30.0–36.0)
MCV: 95.7 fL (ref 80.0–100.0)
Monocytes Absolute: 0.7 10*3/uL (ref 0.1–1.0)
Monocytes Relative: 7 %
Neutro Abs: 6.9 10*3/uL (ref 1.7–7.7)
Neutrophils Relative %: 75 %
Platelets: 380 10*3/uL (ref 150–400)
RBC: 3.47 MIL/uL — ABNORMAL LOW (ref 3.87–5.11)
RDW: 16.7 % — ABNORMAL HIGH (ref 11.5–15.5)
WBC: 9.3 10*3/uL (ref 4.0–10.5)

## 2020-12-08 LAB — BASIC METABOLIC PANEL
Anion gap: 9 (ref 5–15)
BUN: 16 mg/dL (ref 8–23)
CO2: 24 mmol/L (ref 22–32)
Calcium: 8.8 mg/dL — ABNORMAL LOW (ref 8.9–10.3)
Chloride: 98 mmol/L (ref 98–111)
Creatinine, Ser: 0.53 mg/dL (ref 0.44–1.00)
GFR, Estimated: >60 mL/min (ref >60)
Glucose, Bld: 125 mg/dL — ABNORMAL HIGH (ref 70–99)
Potassium: 3.8 mmol/L (ref 3.5–5.1)
Sodium: 131 mmol/L — ABNORMAL LOW (ref 135–145)

## 2020-12-08 LAB — C-REACTIVE PROTEIN: CRP: 5.6 mg/dL — ABNORMAL HIGH (ref <1.0)

## 2020-12-08 MED ORDER — ONDANSETRON HCL 4 MG PO TABS: 4.0000 mg | ORAL_TABLET | Freq: Three times a day (TID) | ORAL | 3 refills | Status: DC | PRN

## 2020-12-08 MED ORDER — TRAMADOL HCL 50 MG PO TABS: 50.0000 mg | ORAL_TABLET | Freq: Two times a day (BID) | ORAL | 0 refills | Status: DC | PRN

## 2020-12-08 MED ORDER — SODIUM CHLORIDE 0.9 % IV SOLN
Freq: Once | INTRAVENOUS | Status: DC
  Filled 2020-12-08: qty 250

## 2020-12-08 MED ORDER — IRON SUCROSE 20 MG/ML IV SOLN
200.0000 mg | Freq: Once | INTRAVENOUS | Status: AC
  Administered 2020-12-08: 200 mg via INTRAVENOUS
  Filled 2020-12-08: qty 10

## 2020-12-08 MED ORDER — SODIUM CHLORIDE 0.9 % IV SOLN: 200.0000 mg | Freq: Once | INTRAVENOUS | Status: DC

## 2020-12-08 MED ORDER — SODIUM CHLORIDE 0.9 % IV SOLN
Freq: Once | INTRAVENOUS | Status: AC
  Filled 2020-12-08: qty 250

## 2020-12-08 MED ORDER — HEPARIN SOD (PORK) LOCK FLUSH 100 UNIT/ML IV SOLN
500.0000 [IU] | Freq: Once | INTRAVENOUS | Status: AC
  Administered 2020-12-08: 500 [IU] via INTRAVENOUS
  Filled 2020-12-08: qty 5

## 2020-12-08 MED ORDER — IPRATROPIUM-ALBUTEROL 0.5-2.5 (3) MG/3ML IN SOLN: 3.0000 mL | RESPIRATORY_TRACT | 3 refills | Status: DC | PRN

## 2020-12-08 MED ORDER — MIRTAZAPINE 30 MG PO TABS: 30.0000 mg | ORAL_TABLET | Freq: Every day | ORAL | 3 refills | Status: DC

## 2020-12-08 MED ORDER — HEPARIN SOD (PORK) LOCK FLUSH 100 UNIT/ML IV SOLN
INTRAVENOUS | Status: AC
  Filled 2020-12-08: qty 5

## 2020-12-08 MED ORDER — SODIUM CHLORIDE 0.9% FLUSH
10.0000 mL | Freq: Once | INTRAVENOUS | Status: AC
  Administered 2020-12-08: 10 mL via INTRAVENOUS
  Filled 2020-12-08: qty 10

## 2020-12-08 NOTE — Progress Notes (Signed)
[place] OFFICE PROGRESS NOTE  Patient Care Team: [place] Pink, MD as PCP - General (Family Medicine) Cammie Sickle, MD as Medical Oncologist (Medical Oncology) Rockey Situ, Kathlene November, MD as Consulting Physician (Cardiology) Efrain Sella, MD as Consulting Physician (Gastroenterology) Noreene Filbert, MD as Referring Physician (Radiation Oncology)  Cancer Staging Malignant neoplasm of lung [facility]) Staging form: Lung, AJCC 7th Edition - Clinical: Stage IIIA (T3, N2, M0) - Signed by Forest Gleason, MD on 05/07/2015 Laterality: Right    Oncology History Overview Note  # Squamous cell carcinoma of right  LOWER LOBE OF  lung stage IIIA based on PET scan Biopsy of the (August of 2016). 2.  After 3 cycles of chemotherapy patient underwent resection of lung mass (November, 2016) ypT2B   ypN0 [Dr.Oaks]  status post right lower lobectomy with a 3. She  was started on carboplatinum and Taxol on a weekly basis and radiation therapy from January of 2017 4. After 2 cycles of carboplatinum patient had neuropathy grade 2 interfering with work so chemotherapy was put on hold and radiation was continued (January 19th, 2017) 5.  Chemotherapy was discontinued because of progressive neuropathy.  Patient is finishing of radiation therapy on November 19, 2015  # NOV 2017- CT- 54m right hilar LN; PET- Feb 2018- NED.   # DEC-JAN 2019- RUL Lung nodule [s/p Bronch; Dr.Kasa- non-diagnostic] SBRT Feb 2019  #October 10, 2018 bronchoscopy [Dr.Kasa]-negative for malignancy/positive for MAC infection-ethambutol [Dr.Ravishankar]; [facility]- R LE DVT [xarelto]x STOP in end of dec 2019. Syncope- MRI- 13 mm cystic leision/asymptomatic/[facility]; Bil subacute cerebellar stroke;  AUG 2019 - GIB/anemia [EGD/colo- Dr.Toledo; NEG]; Aug 2019-  Severe hypokalemia- sec to Florinef-resolved.   DIAGNOSIS: RLL stage III lung ca; RUL stage I   GOALS:  curative  CURRENT/MOST RECENT THERAPY : Surveillaince    Malignant neoplasm of lung (HCC)  Malignant neoplasm of right upper lobe of lung (HCC)      INTERVAL HISTORY:  Claudia [id number].  female pleasant patient above history of stage III lung cancer; and also stage I right upper lobe status post radiation February 2019; history of MAC infection-poor tolerance to therapy; and also right lung abscess status post drain; question Aspergillus infection on voriconazole* and anemia secondary to chronic disease/iron deficiency is here for follow-up.  Patient admits her fatigue is improving.  Not resolved.  Continues to have shortness of breath on exertion.  Positive for dry cough.  Denies any blood in stools or black or stools.  No nausea no vomiting.  No headaches.  Patient states that she is concerned that her depression is not well controlled.  Patient states her mood gets low around this time of the year [her husband passed away 4 years ago on this time] however appetite is good.  No weight loss.   Review of Systems  Constitutional: Positive for malaise/fatigue. Negative for chills, diaphoresis and fever.  HENT: Negative for nosebleeds and sore throat.   Eyes: Negative for double vision.  Respiratory: Positive for cough, sputum production and shortness of breath. Negative for hemoptysis and wheezing.   Cardiovascular: Negative for chest pain, palpitations, orthopnea and leg swelling.  Gastrointestinal: Negative for abdominal pain, blood in stool, constipation, diarrhea, heartburn, melena, nausea and vomiting.  Genitourinary: Negative for dysuria, frequency and urgency.  Musculoskeletal: Positive for back pain and joint pain.  Skin: Negative.  Negative for itching and rash.  Neurological: Negative for tingling, focal weakness, weakness and  headaches.  Endo/Heme/Allergies: Does not bruise/bleed easily.  Psychiatric/Behavioral: Negative for depression. The patient is not  nervous/anxious and does not have insomnia.       PAST MEDICAL HISTORY :  Past Medical History:  Diagnosis Date  . Cancer of lower lobe of right lung ([place]) 05/07/2015  . COPD (chronic obstructive pulmonary disease) ([place])   . Dyspnea   . Non-small cell lung cancer ([place])   . Pneumonia 04/2015  . Pulmonary Mycobacterium avium complex (MAC) infection ([place]) 10/25/2018    PAST SURGICAL HISTORY :   Past Surgical History:  Procedure Laterality Date  . ABDOMINAL HYSTERECTOMY    . COLONOSCOPY WITH PROPOFOL N/A 06/09/2018   Procedure: COLONOSCOPY WITH PROPOFOL;  Surgeon: Toledo, Benay Pike, MD;  Location: [facility];  Service: Gastroenterology;  Laterality: N/A;  . ELBOW SURGERY Right 1995  . ELECTROMAGNETIC NAVIGATION BROCHOSCOPY N/A 10/25/2017   Procedure: ELECTROMAGNETIC NAVIGATION BRONCHOSCOPY;  Surgeon: [name], MD;  Location: [facility];  Service: Cardiopulmonary;  Laterality: N/A;  . ESOPHAGOGASTRODUODENOSCOPY N/A 06/08/2018   Procedure: ESOPHAGOGASTRODUODENOSCOPY (EGD);  Surgeon: Toledo, Benay Pike, MD;  Location: [facility];  Service: Gastroenterology;  Laterality: N/A;  . FLEXIBLE BRONCHOSCOPY Right 10/10/2018   Procedure: FLEXIBLE BRONCHOSCOPY;  Surgeon: [name], MD;  Location: [facility];  Service: Cardiopulmonary;  Laterality: Right;  . PORTACATH PLACEMENT Right 05/10/2015   Procedure: INSERTION PORT-A-CATH;  Surgeon: Nestor Lewandowsky, MD;  Location: [facility];  Service: General;  Laterality: Right;  Marland Kitchen VIDEO ASSISTED THORACOSCOPY (VATS)/THOROCOTOMY Right 08/18/2015   Procedure: VIDEO ASSISTED THORACOSCOPY (VATS)/THOROCOTOMY;  Surgeon: Nestor Lewandowsky, MD;  Location: [facility];  Service: General;  Laterality: Right;    FAMILY HISTORY :   Family History  Problem Relation Age of Onset  . Stroke Mother   . Lung cancer Father 72    SOCIAL HISTORY:   Social History   Tobacco Use  . Smoking status: Former Smoker    Packs/day: 0.50    Years: 40.00    Pack years: 20.00    Types:  Cigarettes    Quit date: 12/01/2019    Years since quitting: 1.0  . Smokeless tobacco: Never Used  Vaping Use  . Vaping Use: Never used  Substance Use Topics  . Alcohol use: Not Currently    Alcohol/week: 2.0 - 4.0 standard drinks    Types: 2 - 4 Cans of beer per week    Comment: beer-occasionally  . Drug use: No    ALLERGIES:  is allergic to keppra [levetiracetam], marinol [dronabinol], and lyrica [pregabalin].  MEDICATIONS:  Current Outpatient Medications  Medication Sig Dispense Refill  . albuterol (PROVENTIL HFA;VENTOLIN HFA) 108 (90 Base) MCG/ACT inhaler Inhale 2 puffs into the lungs every 6 (six) hours as needed for wheezing or shortness of breath.     . benzonatate (TESSALON) 100 MG capsule Take 1 capsule (100 mg total) by mouth 4 (four) times daily as needed for cough. 90 capsule 3  . Cholecalciferol (VITAMIN D3) 1000 units CAPS Take 2,000 Units by mouth daily.     Marland Kitchen ELIQUIS 2.5 MG TABS tablet TAKE 1 TABLET(2.5 MG) BY MOUTH TWICE DAILY 60 tablet 2  . Fluticasone-Salmeterol (ADVAIR) 250-50 MCG/DOSE AEPB Inhale 1 puff into the lungs every 12 (twelve) hours.     Marland Kitchen Respiratory Therapy Supplies (FLUTTER) DEVI 1 each by Does not apply route daily. 1 each 0  . vitamin B-12 (CYANOCOBALAMIN) 1000 MCG tablet Take 1,000 mcg by mouth daily.    Marland Kitchen ipratropium-albuterol (DUONEB) 0.5-2.5 (3) MG/3ML SOLN Take 3 mLs  by nebulization every 4 (four) hours as needed. 360 mL 3  . mirtazapine (REMERON) 30 MG tablet Take 1 tablet (30 mg total) by mouth at bedtime. 30 tablet 3  . ondansetron (ZOFRAN) 4 MG tablet Take 1 tablet (4 mg total) by mouth every 8 (eight) hours as needed for nausea or vomiting. 45 tablet 3  . traMADol (ULTRAM) 50 MG tablet Take 1 tablet (50 mg total) by mouth every 12 (twelve) hours as needed for moderate pain or severe pain. 60 tablet 0   No current facility-administered medications for this visit.   Facility-Administered Medications Ordered in Other Visits  Medication Dose  Route Frequency Provider Last Rate Last Admin  . 0.9 %  sodium chloride infusion   Intravenous Once Charlaine Dalton R, MD      . sodium chloride flush (NS) 0.9 % injection 10 mL  10 mL Intravenous PRN Cammie Sickle, MD        PHYSICAL EXAMINATION: ECOG PERFORMANCE STATUS: 1 - Symptomatic but completely ambulatory  BP 114/68 (BP Location: Left Arm, Patient Position: Sitting, Cuff Size: Normal)   Pulse 99   Temp (!) 97.1 F (36.2 C) (Tympanic)   Resp 16   Ht 5' (1.524 m)   Wt 85 lb 9.6 oz (38.8 kg)   SpO2 100%   BMI 16.72 kg/m   Filed Weights   12/08/20 1052  Weight: 85 lb 9.6 oz (38.8 kg)    Physical Exam Constitutional:      Comments: Cachectic appearing thin built Caucasian female patient.  She is alone.  She is ambulating independently.  HENT:     Head: Normocephalic and atraumatic.     Mouth/Throat:     Pharynx: No oropharyngeal exudate.  Eyes:     Pupils: Pupils are equal, round, and reactive to light.  Cardiovascular:     Rate and Rhythm: Normal rate and regular rhythm.  Pulmonary:     Effort: No respiratory distress.     Breath sounds: No wheezing.     Comments: Decreased air entry bilaterally right more than left.  Patient has a drain in the right chest wall. Abdominal:     General: Bowel sounds are normal. There is no distension.     Palpations: Abdomen is soft. There is no mass.     Tenderness: There is no abdominal tenderness. There is no guarding or rebound.  Musculoskeletal:        General: No tenderness. Normal range of motion.     Cervical back: Normal range of motion and neck supple.  Skin:    General: Skin is warm.  Neurological:     Mental Status: She is alert and oriented to person, place, and time.  Psychiatric:        Mood and Affect: Affect normal.    LABORATORY DATA:  I have reviewed the data as listed    Component Value Date/Time   NA 131 (L) 12/08/2020 1039   NA 137 07/30/2018 1245   K 3.8 12/08/2020 1039   CL 98  12/08/2020 1039   CO2 24 12/08/2020 1039   GLUCOSE 125 (H) 12/08/2020 1039   BUN 16 12/08/2020 1039   BUN 11 07/30/2018 1245   CREATININE 0.53 12/08/2020 1039   CALCIUM 8.8 (L) 12/08/2020 1039   PROT 8.2 (H) 07/29/2020 0839   ALBUMIN 2.4 (L) 07/29/2020 0839   AST 17 07/29/2020 0839   ALT 14 07/29/2020 0839   ALKPHOS 123 07/29/2020 0839   BILITOT 0.3 07/29/2020 3354  GFRNONAA >60 12/08/2020 1039   GFRAA >60 06/09/2020 1025    No results found for: SPEP, UPEP  Lab Results  Component Value Date   WBC 9.3 12/08/2020   NEUTROABS 6.9 12/08/2020   HGB 10.9 (L) 12/08/2020   HCT 33.2 (L) 12/08/2020   MCV 95.7 12/08/2020   PLT 380 12/08/2020      Chemistry      Component Value Date/Time   NA 131 (L) 12/08/2020 1039   NA 137 07/30/2018 1245   K 3.8 12/08/2020 1039   CL 98 12/08/2020 1039   CO2 24 12/08/2020 1039   BUN 16 12/08/2020 1039   BUN 11 07/30/2018 1245   CREATININE 0.53 12/08/2020 1039      Component Value Date/Time   CALCIUM 8.8 (L) 12/08/2020 1039   ALKPHOS 123 07/29/2020 0839   AST 17 07/29/2020 0839   ALT 14 07/29/2020 0839   BILITOT 0.3 07/29/2020 0839       RADIOGRAPHIC STUDIES: I have personally reviewed the radiological images as listed and agreed with the findings in the report. No results found.   ASSESSMENT & PLAN:  Malignant neoplasm of right upper lobe of lung ([place]) # Right upper lobe stage I lung cancer; s/p feb February 2019 SBRT; Left lung stage III lung cancer- JAN 24th, 2022-collapse/consolidative opacity right base progressive; progression of right hilar/mediastinal lymphadenopathy-reactive versus metastatic disease.  Right apical posterior-cavitary lesion/fluid collection improved-s/p drain [see below]  #Monitor above imaging findings for now as patient is clinically doing well.  We will plan imaging again April and/May  #Bilateral lower extremity DVT[April 03, 2020]-currently on Eliquis 2.5 mg twice daily.  Stable.  #Infectious disease:  Bilateral MAC/persistent-poor tolerance to amikacin; discontinued.  Right upper lobe cavitary lesion s/p drain-Aspergillus.  Discussed with ID no plans for any treatment at this time.  #Anemia secondary to iron deficiency ? Etiology-hemoglobin today is 10.9;.  Patient significantly improved post IV iron infusions.  Proceed with Venofer today.  # COVID vaccination- s/p 1; but needs to restart again.again counseled recommend patient take the course of Covid vaccination.  Again reminded the patient.  #Chronic chest wall pain/chronic cough ; new prescription for tramadol given.  Tessalon Perles.  #Antidepressant-increase the dose of Remeron to 30 mg nightly; also should help with her appetite/weight gain [patient approximately 80 pounds]  # DISPOSITION:  # Venofer today # follow up in 86month MD; labs- cbc/bmp;CRP; iron studies.ferritin; ;  possible venofer- Dr.B.  Cc: Drs. Kasa/Ravikrishnan/ Crystal/ Hedrick.     Orders Placed This Encounter  Procedures  . CBC with Differential/Platelet    Standing Status:   Future    Standing Expiration Date:   12/08/2021  . Basic metabolic panel    Standing Status:   Future    Standing Expiration Date:   12/08/2021  . C-reactive protein    Standing Status:   Future    Standing Expiration Date:   12/08/2021  . Iron and TIBC    Standing Status:   Future    Standing Expiration Date:   12/08/2021  . Ferritin    Standing Status:   Future    Standing Expiration Date:   12/08/2021   All questions were answered. The patient knows to call the clinic with any problems, questions or concerns.      GCammie Sickle MD 12/08/2020 12:26 PM

## 2020-12-08 NOTE — Progress Notes (Signed)
Needs refill on duoneb solution, zofran and tramadol. Is asking if the duoneb solution can be increased.

## 2020-12-08 NOTE — Telephone Encounter (Signed)
Nestor Lewandowsky Key: [id number] - PA Case ID: [id number]- For duoneb submitted.

## 2020-12-08 NOTE — Assessment & Plan Note (Addendum)
#   Right upper lobe stage I lung cancer; s/p feb February 2019 SBRT; Left lung stage III lung cancer- JAN 24th, 2022-collapse/consolidative opacity right base progressive; progression of right hilar/mediastinal lymphadenopathy-reactive versus metastatic disease.  Right apical posterior-cavitary lesion/fluid collection improved-s/p drain [see below]  #Monitor above imaging findings for now as patient is clinically doing well.  We will plan imaging again April and/May  #Bilateral lower extremity DVT[April 03, 2020]-currently on Eliquis 2.5 mg twice daily.  Stable.  #Infectious disease: Bilateral MAC/persistent-poor tolerance to amikacin; discontinued.  Right upper lobe cavitary lesion s/p drain-Aspergillus.  Discussed with ID no plans for any treatment at this time.  #Anemia secondary to iron deficiency ? Etiology-hemoglobin today is 10.9;.  Patient significantly improved post IV iron infusions.  Proceed with Venofer today.  # COVID vaccination- s/p 1; but needs to restart again.again counseled recommend patient take the course of Covid vaccination.  Again reminded the patient.  #Chronic chest wall pain/chronic cough ; new prescription for tramadol given.  Tessalon Perles.  #Antidepressant-increase the dose of Remeron to 30 mg nightly; also should help with her appetite/weight gain [patient approximately 80 pounds]  # DISPOSITION:  # Venofer today # follow up in 70month MD; labs- cbc/bmp;CRP; iron studies.ferritin; ;  possible venofer- Dr.B.  Cc: Drs. Kasa/Ravikrishnan/ Crystal/ Hedrick.

## 2020-12-09 NOTE — Telephone Encounter (Signed)
Patient's duoneb was denied for medicare part d. Must bill medicare part B per insurance. I contacted the pharmacy and left a detailed vm that the duoneb was denied by part d and to bill part b

## 2020-12-14 ENCOUNTER — Other Ambulatory Visit: Payer: Self-pay | Admitting: Internal Medicine

## 2020-12-27 ENCOUNTER — Other Ambulatory Visit: Payer: Self-pay | Admitting: *Deleted

## 2020-12-27 MED ORDER — BENZONATATE 100 MG PO CAPS: 100.0000 mg | ORAL_CAPSULE | Freq: Three times a day (TID) | ORAL | 3 refills | Status: DC | PRN

## 2021-02-08 ENCOUNTER — Ambulatory Visit: Payer: PPO | Admitting: Physician Assistant

## 2021-02-08 ENCOUNTER — Other Ambulatory Visit: Payer: Self-pay

## 2021-02-08 DIAGNOSIS — J869 Pyothorax without fistula: Secondary | ICD-10-CM

## 2021-02-09 ENCOUNTER — Ambulatory Visit: Payer: PPO | Admitting: Physician Assistant

## 2021-02-09 ENCOUNTER — Telehealth: Payer: Self-pay | Admitting: Physician Assistant

## 2021-02-09 ENCOUNTER — Other Ambulatory Visit: Payer: Self-pay

## 2021-02-09 ENCOUNTER — Ambulatory Visit
Admission: RE | Admit: 2021-02-09 | Discharge: 2021-02-09 | Disposition: A | Discharge status: Home / Self Care | Payer: PPO | Source: Ambulatory Visit | Attending: Physician Assistant | Admitting: Physician Assistant

## 2021-02-09 ENCOUNTER — Ambulatory Visit
Admission: RE | Admit: 2021-02-09 | Discharge: 2021-02-09 | Disposition: A | Discharge status: Home / Self Care | Payer: PPO | Attending: Physician Assistant | Admitting: Physician Assistant

## 2021-02-09 DIAGNOSIS — C3490 Malignant neoplasm of unspecified part of unspecified bronchus or lung: Secondary | ICD-10-CM | POA: Diagnosis not present

## 2021-02-09 DIAGNOSIS — J869 Pyothorax without fistula: Secondary | ICD-10-CM | POA: Insufficient documentation

## 2021-02-09 DIAGNOSIS — J449 Chronic obstructive pulmonary disease, unspecified: Secondary | ICD-10-CM | POA: Diagnosis not present

## 2021-02-09 NOTE — Telephone Encounter (Signed)
Spoke with the patient and she will get her chest xray done today. She will follow up with Thedore Mins on the 19th.

## 2021-02-09 NOTE — Telephone Encounter (Signed)
Patient said she would like to stay with Thedore Mins , but it also asking if she could get a refill on her Tramadol patient is using [address]. Please call patient and advise.

## 2021-02-17 ENCOUNTER — Ambulatory Visit: Payer: PPO | Admitting: Physician Assistant

## 2021-02-17 ENCOUNTER — Other Ambulatory Visit: Payer: Self-pay

## 2021-02-17 ENCOUNTER — Encounter: Payer: Self-pay | Admitting: Physician Assistant

## 2021-02-17 VITALS — BP 120/73 | HR 112 | Temp 98.0°F | Ht 60.0 in | Wt 82.2 lb

## 2021-02-17 DIAGNOSIS — J869 Pyothorax without fistula: Secondary | ICD-10-CM

## 2021-02-17 MED ORDER — TRAMADOL HCL 50 MG PO TABS: 50.0000 mg | ORAL_TABLET | Freq: Two times a day (BID) | ORAL | 0 refills | Status: DC | PRN

## 2021-02-17 NOTE — Progress Notes (Signed)
[facility] NOTE  02/17/2021  History of Present Illness: Claudia Chavez is a 65 y.o. female well known to our service secondary to right lung abscess/empyema s/p percutaneous chest tube placement on 03/29/2020 with Dr Genevive Bi. She was last seen on 07/23/2020 and had been managing well.   She presents today with concerns over her empyema drain. She reports around two weeks ago, she had an episode of sharp pain at the tube site. She recalls that in the past, she had an episode of similar pain and had found her tube backed out. She did not notice any changes to the tube itself recently. She continues to use colostomy appliances to mange drainage and air. No deviations from baseline SOB. No fever, chills, CP, nausea. She has otherwise done well. She is requesting a refill pf her tramadol. Continues to follow with hematology/oncolgy for DVT history. No other complaints today.   Past Medical History: Past Medical History:  Diagnosis Date  . Cancer of lower lobe of right lung ([place]) 05/07/2015  . COPD (chronic obstructive pulmonary disease) ([place])   . Dyspnea   . Non-small cell lung cancer ([place])   . Pneumonia 04/2015  . Pulmonary Mycobacterium avium complex (MAC) infection ([place]) 10/25/2018     Past Surgical History: Past Surgical History:  Procedure Laterality Date  . ABDOMINAL HYSTERECTOMY    . COLONOSCOPY WITH PROPOFOL N/A 06/09/2018   Procedure: COLONOSCOPY WITH PROPOFOL;  Surgeon: Toledo, Benay Pike, MD;  Location: [facility];  Service: Gastroenterology;  Laterality: N/A;  . ELBOW SURGERY Right 1995  . ELECTROMAGNETIC NAVIGATION BROCHOSCOPY N/A 10/25/2017   Procedure: ELECTROMAGNETIC NAVIGATION BRONCHOSCOPY;  Surgeon: [name], MD;  Location: [facility];  Service: Cardiopulmonary;  Laterality: N/A;  . ESOPHAGOGASTRODUODENOSCOPY N/A 06/08/2018   Procedure: ESOPHAGOGASTRODUODENOSCOPY (EGD);  Surgeon: Toledo, Benay Pike, MD;  Location: [facility];  Service:  Gastroenterology;  Laterality: N/A;  . FLEXIBLE BRONCHOSCOPY Right 10/10/2018   Procedure: FLEXIBLE BRONCHOSCOPY;  Surgeon: [name], MD;  Location: [facility];  Service: Cardiopulmonary;  Laterality: Right;  . PORTACATH PLACEMENT Right 05/10/2015   Procedure: INSERTION PORT-A-CATH;  Surgeon: Nestor Lewandowsky, MD;  Location: [facility];  Service: General;  Laterality: Right;  Marland Kitchen VIDEO ASSISTED THORACOSCOPY (VATS)/THOROCOTOMY Right 08/18/2015   Procedure: VIDEO ASSISTED THORACOSCOPY (VATS)/THOROCOTOMY;  Surgeon: Nestor Lewandowsky, MD;  Location: [facility];  Service: General;  Laterality: Right;    Home Medications: Prior to Admission medications   Medication Sig Start Date End Date Taking? Authorizing Provider  albuterol (PROVENTIL HFA;VENTOLIN HFA) 108 (90 Base) MCG/ACT inhaler Inhale 2 puffs into the lungs every 6 (six) hours as needed for wheezing or shortness of breath.     [provider]  benzonatate (TESSALON) 100 MG capsule Take 1 capsule (100 mg total) by mouth 3 (three) times daily as needed for cough. 12/27/20   Cammie Sickle, MD  Cholecalciferol (VITAMIN D3) 1000 units CAPS Take 2,000 Units by mouth daily.     [provider]  ELIQUIS 2.5 MG TABS tablet TAKE 1 TABLET(2.5 MG) BY MOUTH TWICE DAILY 12/14/20   Cammie Sickle, MD  Fluticasone-Salmeterol (ADVAIR) 250-50 MCG/DOSE AEPB Inhale 1 puff into the lungs every 12 (twelve) hours.  09/05/17   [provider]  ipratropium-albuterol (DUONEB) 0.5-2.5 (3) MG/3ML SOLN Take 3 mLs by nebulization every 4 (four) hours as needed. 12/08/20   Cammie Sickle, MD  mirtazapine (REMERON) 30 MG tablet Take 1 tablet (30 mg total) by mouth at bedtime. 12/08/20   Charlaine Dalton  R, MD  ondansetron (ZOFRAN) 4 MG tablet Take 1 tablet (4 mg total) by mouth every 8 (eight) hours as needed for nausea or vomiting. 12/08/20   Cammie Sickle, MD  Respiratory Therapy Supplies (FLUTTER) DEVI 1 each by Does not apply route daily.  09/12/18   [name], MD  traMADol (ULTRAM) 50 MG tablet Take 1 tablet (50 mg total) by mouth every 12 (twelve) hours as needed for moderate pain or severe pain. 12/08/20   Cammie Sickle, MD  vitamin B-12 (CYANOCOBALAMIN) 1000 MCG tablet Take 1,000 mcg by mouth daily.    [provider]    Allergies: Allergies  Allergen Reactions  . Keppra [Levetiracetam] Other (See Comments)    Nausea and dizzy  . Marinol [Dronabinol] Other (See Comments)    Dizziness; bad dreams  . Lyrica [Pregabalin] Other (See Comments)    Made patient feel very dizzy and not feel good.     Review of Systems: Review of Systems  Constitutional: Negative for chills and fever.  Respiratory: Negative for cough and shortness of breath.   Cardiovascular: Positive for chest pain. Negative for palpitations.  Gastrointestinal: Negative for diarrhea, nausea and vomiting.  Genitourinary: Negative for dysuria and urgency.  All other systems reviewed and are negative.   Physical Exam BP 120/73   Pulse (!) 112   Temp 98 F (36.7 C) (Oral)   Ht 5' (1.524 m)   Wt 82 lb 3.2 oz (37.3 kg)   SpO2 100%   BMI 16.05 kg/m   Physical Exam Vitals and nursing note reviewed. Exam conducted with a chaperone present.  Constitutional:      General: She is not in acute distress.    Appearance: She is cachectic. She is not ill-appearing.     Comments: NAD  HENT:     Head: Normocephalic and atraumatic.  Eyes:     General: No scleral icterus.    Conjunctiva/sclera: Conjunctivae normal.     Comments: Wearing glasses   Cardiovascular:     Rate and Rhythm: Normal rate and regular rhythm.     Pulses: Normal pulses.     Heart sounds: No murmur heard.   Pulmonary:     Effort: Pulmonary effort is normal. No respiratory distress.     Breath sounds: Examination of the right-lower field reveals decreased breath sounds. Decreased breath sounds present. No wheezing.  Chest:    Genitourinary:    Comments:  Deferred Musculoskeletal:     Right lower leg: No edema.     Left lower leg: No edema.  Skin:    General: Skin is warm and dry.     Coloration: Skin is not pale.     Findings: No erythema.  Neurological:     General: No focal deficit present.     Mental Status: She is alert and oriented to person, place, and time.  Psychiatric:        Mood and Affect: Mood normal.        Behavior: Behavior normal.     Labs/Imaging:  CXR (02/09/2021) personally reviewed which shows stable position of empyema drain unchanged form prior, and radiologist report reviewed:  IMPRESSION: 1. No significant change from radiograph 07/21/2020. 2. No acute findings.   Assessment and Plan: This is a 65 y.o. female with right lung abscess/empyema s/p percutaneous chest tube placement on 03/29/2020   - No changes to tube positioning on CXR; she continues to do well with using colostomy appliance to manage air/purulent output  -  I will refill her tramadol for her  - I did review our limited thoracic surgery resources at this facility currently. She understands that should any need for intervention arise, we would likely need to refer her elsewhere. She is not interested in pursuing any interventions (ex: Eloesser Flap)   - She will follow up on an as needed basis  Face-to-face time spent with the patient and care providers was 20 minutes, with more than 50% of the time spent counseling, educating, and coordinating care of the patient.     Edison Simon, PA-C [facility] 02/17/2021, 12:11 PM [phone] M-F: 7am - 4pm

## 2021-02-17 NOTE — Addendum Note (Signed)
Addended by: Edison Simon R on: 02/17/2021 12:14 PM   Modules accepted: Orders

## 2021-02-17 NOTE — Patient Instructions (Signed)
If you have any concerns or questions, please feel free to call our office.    

## 2021-03-09 ENCOUNTER — Other Ambulatory Visit: Payer: Self-pay

## 2021-03-09 ENCOUNTER — Encounter: Payer: Self-pay | Admitting: Internal Medicine

## 2021-03-09 ENCOUNTER — Inpatient Hospital Stay: Payer: PPO | Attending: Internal Medicine

## 2021-03-09 ENCOUNTER — Inpatient Hospital Stay: Payer: PPO

## 2021-03-09 ENCOUNTER — Inpatient Hospital Stay (HOSPITAL_BASED_OUTPATIENT_CLINIC_OR_DEPARTMENT_OTHER): Payer: PPO | Admitting: Internal Medicine

## 2021-03-09 VITALS — BP 101/71 | HR 94 | Temp 98.4°F | Resp 17

## 2021-03-09 DIAGNOSIS — D509 Iron deficiency anemia, unspecified: Secondary | ICD-10-CM | POA: Insufficient documentation

## 2021-03-09 DIAGNOSIS — C3411 Malignant neoplasm of upper lobe, right bronchus or lung: Secondary | ICD-10-CM | POA: Diagnosis not present

## 2021-03-09 DIAGNOSIS — E876 Hypokalemia: Secondary | ICD-10-CM

## 2021-03-09 LAB — BASIC METABOLIC PANEL
Anion gap: 10 (ref 5–15)
BUN: 14 mg/dL (ref 8–23)
CO2: 24 mmol/L (ref 22–32)
Calcium: 8.6 mg/dL — ABNORMAL LOW (ref 8.9–10.3)
Chloride: 95 mmol/L — ABNORMAL LOW (ref 98–111)
Creatinine, Ser: 0.51 mg/dL (ref 0.44–1.00)
GFR, Estimated: >60 mL/min (ref >60)
Glucose, Bld: 106 mg/dL — ABNORMAL HIGH (ref 70–99)
Potassium: 4.4 mmol/L (ref 3.5–5.1)
Sodium: 129 mmol/L — ABNORMAL LOW (ref 135–145)

## 2021-03-09 LAB — CBC WITH DIFFERENTIAL/PLATELET
Abs Immature Granulocytes: 0.02 10*3/uL (ref 0.00–0.07)
Basophils Absolute: 0.1 10*3/uL (ref 0.0–0.1)
Basophils Relative: 1 %
Eosinophils Absolute: 0.1 10*3/uL (ref 0.0–0.5)
Eosinophils Relative: 1 %
HCT: 34.1 % — ABNORMAL LOW (ref 36.0–46.0)
Hemoglobin: 11.2 g/dL — ABNORMAL LOW (ref 12.0–15.0)
Immature Granulocytes: 0 %
Lymphocytes Relative: 16 %
Lymphs Abs: 1.4 10*3/uL (ref 0.7–4.0)
MCH: 31.9 pg (ref 26.0–34.0)
MCHC: 32.8 g/dL (ref 30.0–36.0)
MCV: 97.2 fL (ref 80.0–100.0)
Monocytes Absolute: 0.7 10*3/uL (ref 0.1–1.0)
Monocytes Relative: 8 %
Neutro Abs: 6.4 10*3/uL (ref 1.7–7.7)
Neutrophils Relative %: 74 %
Platelets: 386 10*3/uL (ref 150–400)
RBC: 3.51 MIL/uL — ABNORMAL LOW (ref 3.87–5.11)
RDW: 14.1 % (ref 11.5–15.5)
WBC: 8.8 10*3/uL (ref 4.0–10.5)
nRBC: 0 % (ref 0.0–0.2)

## 2021-03-09 LAB — IRON AND TIBC
Iron: 31 ug/dL (ref 28–170)
Saturation Ratios: 15 % (ref 10.4–31.8)
TIBC: 203 ug/dL — ABNORMAL LOW (ref 250–450)
UIBC: 172 ug/dL

## 2021-03-09 LAB — FERRITIN: Ferritin: 279 ng/mL (ref 11–307)

## 2021-03-09 MED ORDER — IRON SUCROSE 20 MG/ML IV SOLN
200.0000 mg | Freq: Once | INTRAVENOUS | Status: AC
  Administered 2021-03-09: 200 mg via INTRAVENOUS
  Filled 2021-03-09: qty 10

## 2021-03-09 MED ORDER — SODIUM CHLORIDE 0.9% FLUSH
10.0000 mL | INTRAVENOUS | Status: DC | PRN
Start: 2021-03-09 — End: 2021-03-09
  Administered 2021-03-09: 10 mL via INTRAVENOUS
  Filled 2021-03-09: qty 10

## 2021-03-09 MED ORDER — SODIUM CHLORIDE 0.9 % IV SOLN: 200.0000 mg | Freq: Once | INTRAVENOUS | Status: DC

## 2021-03-09 MED ORDER — HEPARIN SOD (PORK) LOCK FLUSH 100 UNIT/ML IV SOLN
500.0000 [IU] | Freq: Once | INTRAVENOUS | Status: AC
  Administered 2021-03-09: 500 [IU] via INTRAVENOUS
  Filled 2021-03-09: qty 5

## 2021-03-09 MED ORDER — MIRTAZAPINE 30 MG PO TABS: 30.0000 mg | ORAL_TABLET | Freq: Every day | ORAL | 3 refills | Status: DC

## 2021-03-09 MED ORDER — IPRATROPIUM-ALBUTEROL 0.5-2.5 (3) MG/3ML IN SOLN: 3.0000 mL | RESPIRATORY_TRACT | 3 refills | Status: AC | PRN

## 2021-03-09 MED ORDER — HEPARIN SOD (PORK) LOCK FLUSH 100 UNIT/ML IV SOLN
INTRAVENOUS | Status: AC
  Filled 2021-03-09: qty 5

## 2021-03-09 MED ORDER — SODIUM CHLORIDE 0.9 % IV SOLN
Freq: Once | INTRAVENOUS | Status: AC
  Filled 2021-03-09: qty 250

## 2021-03-09 NOTE — Assessment & Plan Note (Addendum)
#   Right upper lobe stage I lung cancer; s/p feb February 2019 SBRT; Left lung stage III lung cancer- JAN 24th, 2022-collapse/consolidative opacity right base progressive; progression of right hilar/mediastinal lymphadenopathy-reactive versus metastatic disease.  Right apical posterior-cavitary lesion/fluid collection improved-s/p drain [see below]-?  Progression/see below  #Given the progressive shortness of breath:  ?  Progression of malignancy versus poorly controlled COPD.  Plan CT scan chest in 1 week.  Called in a prescription for DuoNeb nebulizer.  Also recommend follow-up with pulmonary regarding adjustment of her medications.  #Bilateral lower extremity DVT[April 03, 2020]-currently on Eliquis 2.5 mg twice daily.  Stable  #Infectious disease: Bilateral MAC/persistent-poor tolerance to amikacin; discontinued.  Right upper lobe cavitary lesion s/p drain-Aspergillus.  Clinically stable.  #Anemia secondary to iron deficiency ? Etiology-hemoglobin today is 11.2.  Patient significantly improved post IV iron infusions.  Proceed with Venofer today.  # COVID vaccination- delines.   #Chronic chest wall pain/chronic cough ; new prescription for tramadol given.  Tessalon Perles.  #Antidepressant-continue dose of Remeron to 30 mg nightly; also should help with her appetite/weight gain [patient approximately 80 pounds] stable.  * will call with CT results # DISPOSITION: # Venofer today # CT scan- 1 week # follow up in 74month MD; labs- cbc/bmp;CRP; iron studies.ferritin; ;  possible venofer- Dr.B.  Cc: Drs. Kasa/Ravikrishnan/ Crystal/ Hedrick.

## 2021-03-09 NOTE — Progress Notes (Signed)
Claudia Chavez  Patient Care Team: [place] Pink, MD as PCP - General (Family Medicine) Cammie Sickle, MD as Medical Oncologist (Medical Oncology) Rockey Situ, Kathlene November, MD as Consulting Physician (Cardiology) Efrain Sella, MD as Consulting Physician (Gastroenterology) Noreene Filbert, MD as Referring Physician (Radiation Oncology)  Cancer Staging Malignant neoplasm of lung [facility]) Staging form: Lung, AJCC 7th Edition - Clinical: Stage IIIA (T3, N2, M0) - Signed by Forest Gleason, MD on 05/07/2015 Laterality: Right    Oncology History Overview Chavez  # Squamous cell carcinoma of right  LOWER LOBE OF  lung stage IIIA based on PET scan Biopsy of the (August of 2016). 2.  After 3 cycles of chemotherapy patient underwent resection of lung mass (November, 2016) ypT2B   ypN0 [Dr.Oaks]  status post right lower lobectomy with a 3. She  was started on carboplatinum and Taxol on a weekly basis and radiation therapy from January of 2017 4. After 2 cycles of carboplatinum patient had neuropathy grade 2 interfering with work so chemotherapy was put on hold and radiation was continued (January 19th, 2017) 5.  Chemotherapy was discontinued because of progressive neuropathy.  Patient is finishing of radiation therapy on November 19, 2015  # NOV 2017- CT- 87m right hilar LN; PET- Feb 2018- NED.   # DEC-JAN 2019- RUL Lung nodule [s/p Bronch; Dr.Kasa- non-diagnostic] SBRT Feb 2019  #October 10, 2018 bronchoscopy [Dr.Kasa]-negative for malignancy/positive for MAC infection-ethambutol [Dr.Ravishankar]; [facility]- R LE DVT [xarelto]x STOP in end of dec 2019. Syncope- MRI- 13 mm cystic leision/asymptomatic/[facility]; Bil subacute cerebellar stroke;  AUG 2019 - GIB/anemia [EGD/colo- Dr.Toledo; NEG]; Aug 2019-  Severe hypokalemia- sec to Florinef-resolved.   DIAGNOSIS: RLL stage III lung ca; RUL stage I   GOALS:  curative  CURRENT/MOST RECENT THERAPY : Surveillaince    Malignant neoplasm of lung (HCC)  Malignant neoplasm of right upper lobe of lung (HCC)      INTERVAL HISTORY:  Claudia [id number].[id number].  female pleasant patient above history of stage III lung cancer; and also stage I right upper lobe status post radiation February 2019; history of MAC infection-poor tolerance to therapy; and also right lung abscess status post drain; question Aspergillus infection on voriconazole* and anemia secondary to chronic disease/iron deficiency is here for follow-up.  Patient complains of worsening shortness of breath in the last 1 month.  Minimal exertion causes her to tire out.  Positive for cough.  No blood in cough.  Patient not using nebulizer; DuoNeb declined by insurance.  Patient any blood in stools or black or stools.  No abdominal pain.  No headaches.  Patient states her appetite is good.  However she is not gaining weight.  She is currently on Remeron.  Review of Systems  Constitutional: Positive for malaise/fatigue. Negative for chills, diaphoresis and fever.  HENT: Negative for nosebleeds and sore throat.   Eyes: Negative for double vision.  Respiratory: Positive for cough, sputum production and shortness of breath. Negative for hemoptysis and wheezing.   Cardiovascular: Negative for chest pain, palpitations, orthopnea and leg swelling.  Gastrointestinal: Negative for abdominal pain, blood in stool, constipation, diarrhea, heartburn, melena, nausea and vomiting.  Genitourinary: Negative for dysuria, frequency and urgency.  Musculoskeletal: Positive for back pain and joint pain.  Skin: Negative.  Negative for itching and rash.  Neurological: Negative for tingling, focal weakness, weakness and headaches.  Endo/Heme/Allergies: Does not bruise/bleed easily.  Psychiatric/Behavioral: Negative for depression.  The patient is not nervous/anxious and does not have insomnia.       PAST MEDICAL  HISTORY :  Past Medical History:  Diagnosis Date  . Cancer of lower lobe of right lung ([place]) 05/07/2015  . COPD (chronic obstructive pulmonary disease) ([place])   . Dyspnea   . Non-small cell lung cancer ([place])   . Pneumonia 04/2015  . Pulmonary Mycobacterium avium complex (MAC) infection ([place]) 10/25/2018    PAST SURGICAL HISTORY :   Past Surgical History:  Procedure Laterality Date  . ABDOMINAL HYSTERECTOMY    . COLONOSCOPY WITH PROPOFOL N/A 06/09/2018   Procedure: COLONOSCOPY WITH PROPOFOL;  Surgeon: Toledo, Benay Pike, MD;  Location: [facility];  Service: Gastroenterology;  Laterality: N/A;  . ELBOW SURGERY Right 1995  . ELECTROMAGNETIC NAVIGATION BROCHOSCOPY N/A 10/25/2017   Procedure: ELECTROMAGNETIC NAVIGATION BRONCHOSCOPY;  Surgeon: [name], MD;  Location: [facility];  Service: Cardiopulmonary;  Laterality: N/A;  . ESOPHAGOGASTRODUODENOSCOPY N/A 06/08/2018   Procedure: ESOPHAGOGASTRODUODENOSCOPY (EGD);  Surgeon: Toledo, Benay Pike, MD;  Location: [facility];  Service: Gastroenterology;  Laterality: N/A;  . FLEXIBLE BRONCHOSCOPY Right 10/10/2018   Procedure: FLEXIBLE BRONCHOSCOPY;  Surgeon: [name], MD;  Location: [facility] OR[facility];  Service: Cardiopulmonary;  Laterality: Right;  . PORTACATH PLACEMENT Right 05/10/2015   Procedure: INSERTION PORT-A-CATH;  Surgeon: Nestor Lewandowsky, MD;  Location: [facility];  Service: General;  Laterality: Right;  Marland Kitchen VIDEO ASSISTED THORACOSCOPY (VATS)/THOROCOTOMY Right 08/18/2015   Procedure: VIDEO ASSISTED THORACOSCOPY (VATS)/THOROCOTOMY;  Surgeon: Nestor Lewandowsky, MD;  Location: [facility];  Service: General;  Laterality: Right;    FAMILY HISTORY :   Family History  Problem Relation Age of Onset  . Stroke Mother   . Lung cancer Father 28    SOCIAL HISTORY:   Social History   Tobacco Use  . Smoking status: Former Smoker    Packs/day: 0.50    Years: 40.00    Pack years: 20.00    Types: Cigarettes    Quit date: 12/01/2019    Years since quitting: 1.2  .  Smokeless tobacco: Never Used  Vaping Use  . Vaping Use: Never used  Substance Use Topics  . Alcohol use: Not Currently    Alcohol/week: 2.0 - 4.0 standard drinks    Types: 2 - 4 Cans of beer per week    Comment: beer-occasionally  . Drug use: No    ALLERGIES:  is allergic to keppra [levetiracetam], marinol [dronabinol], and lyrica [pregabalin].  MEDICATIONS:  Current Outpatient Medications  Medication Sig Dispense Refill  . albuterol (PROVENTIL HFA;VENTOLIN HFA) 108 (90 Base) MCG/ACT inhaler Inhale 2 puffs into the lungs every 6 (six) hours as needed for wheezing or shortness of breath.     . benzonatate (TESSALON) 100 MG capsule Take 1 capsule (100 mg total) by mouth 3 (three) times daily as needed for cough. 90 capsule 3  . Cholecalciferol (VITAMIN D3) 1000 units CAPS Take 2,000 Units by mouth daily.     Marland Kitchen ELIQUIS 2.5 MG TABS tablet TAKE 1 TABLET(2.5 MG) BY MOUTH TWICE DAILY 60 tablet 2  . Fluticasone-Salmeterol (ADVAIR) 250-50 MCG/DOSE AEPB Inhale 1 puff into the lungs every 12 (twelve) hours.     . ondansetron (ZOFRAN) 4 MG tablet Take 1 tablet (4 mg total) by mouth every 8 (eight) hours as needed for nausea or vomiting. 45 tablet 3  . Respiratory Therapy Supplies (FLUTTER) DEVI 1 each by Does not apply route daily. 1 each 0  . traMADol (ULTRAM) 50 MG tablet Take 1 tablet (50  mg total) by mouth every 12 (twelve) hours as needed for moderate pain or severe pain. 60 tablet 0  . vitamin B-12 (CYANOCOBALAMIN) 1000 MCG tablet Take 1,000 mcg by mouth daily.    Marland Kitchen ipratropium-albuterol (DUONEB) 0.5-2.5 (3) MG/3ML SOLN Take 3 mLs by nebulization every 4 (four) hours as needed. 360 mL 3  . mirtazapine (REMERON) 30 MG tablet Take 1 tablet (30 mg total) by mouth at bedtime. 30 tablet 3   No current facility-administered medications for this visit.   Facility-Administered Medications Ordered in Other Visits  Medication Dose Route Frequency Provider Last Rate Last Admin  . heparin lock flush  100 unit/mL  500 Units Intravenous Once Charlaine Dalton R, MD      . sodium chloride flush (NS) 0.9 % injection 10 mL  10 mL Intravenous PRN Charlaine Dalton R, MD      . sodium chloride flush (NS) 0.9 % injection 10 mL  10 mL Intravenous PRN Cammie Sickle, MD   10 mL at 03/09/21 1300    PHYSICAL EXAMINATION: ECOG PERFORMANCE STATUS: 1 - Symptomatic but completely ambulatory  BP 91/68 (BP Location: Left Arm, Patient Position: Sitting, Cuff Size: Normal)   Pulse 99   Temp 99 F (37.2 C) (Tympanic)   Resp 16   Ht 5' (1.524 m)   Wt 81 lb (36.7 kg)   SpO2 100%   BMI 15.82 kg/m   Filed Weights   03/09/21 1308  Weight: 81 lb (36.7 kg)    Physical Exam Constitutional:      Comments: Cachectic appearing thin built Caucasian female patient.  She is alone.  She is ambulating independently.  HENT:     Head: Normocephalic and atraumatic.     Mouth/Throat:     Pharynx: No oropharyngeal exudate.  Eyes:     Pupils: Pupils are equal, round, and reactive to light.  Cardiovascular:     Rate and Rhythm: Normal rate and regular rhythm.  Pulmonary:     Effort: No respiratory distress.     Breath sounds: No wheezing.     Comments: Decreased air entry bilaterally right more than left.  Patient has a drain in the right chest wall. Abdominal:     General: Bowel sounds are normal. There is no distension.     Palpations: Abdomen is soft. There is no mass.     Tenderness: There is no abdominal tenderness. There is no guarding or rebound.  Musculoskeletal:        General: No tenderness. Normal range of motion.     Cervical back: Normal range of motion and neck supple.  Skin:    General: Skin is warm.  Neurological:     Mental Status: She is alert and oriented to person, place, and time.  Psychiatric:        Mood and Affect: Affect normal.    LABORATORY DATA:  I have reviewed the data as listed    Component Value Date/Time   NA 129 (L) 03/09/2021 1250   NA 137 07/30/2018  1245   K 4.4 03/09/2021 1250   CL 95 (L) 03/09/2021 1250   CO2 24 03/09/2021 1250   GLUCOSE 106 (H) 03/09/2021 1250   BUN 14 03/09/2021 1250   BUN 11 07/30/2018 1245   CREATININE 0.51 03/09/2021 1250   CALCIUM 8.6 (L) 03/09/2021 1250   PROT 8.2 (H) 07/29/2020 0839   ALBUMIN 2.4 (L) 07/29/2020 0839   AST 17 07/29/2020 0839   ALT 14 07/29/2020 0839  ALKPHOS 123 07/29/2020 0839   BILITOT 0.3 07/29/2020 0839   GFRNONAA >60 03/09/2021 1250   GFRAA >60 06/09/2020 1025    No results found for: SPEP, UPEP  Lab Results  Component Value Date   WBC 8.8 03/09/2021   NEUTROABS 6.4 03/09/2021   HGB 11.2 (L) 03/09/2021   HCT 34.1 (L) 03/09/2021   MCV 97.2 03/09/2021   PLT 386 03/09/2021      Chemistry      Component Value Date/Time   NA 129 (L) 03/09/2021 1250   NA 137 07/30/2018 1245   K 4.4 03/09/2021 1250   CL 95 (L) 03/09/2021 1250   CO2 24 03/09/2021 1250   BUN 14 03/09/2021 1250   BUN 11 07/30/2018 1245   CREATININE 0.51 03/09/2021 1250      Component Value Date/Time   CALCIUM 8.6 (L) 03/09/2021 1250   ALKPHOS 123 07/29/2020 0839   AST 17 07/29/2020 0839   ALT 14 07/29/2020 0839   BILITOT 0.3 07/29/2020 0839       RADIOGRAPHIC STUDIES: I have personally reviewed the radiological images as listed and agreed with the findings in the report. No results found.   ASSESSMENT & PLAN:  Malignant neoplasm of right upper lobe of lung ([place]) # Right upper lobe stage I lung cancer; s/p feb February 2019 SBRT; Left lung stage III lung cancer- JAN 24th, 2022-collapse/consolidative opacity right base progressive; progression of right hilar/mediastinal lymphadenopathy-reactive versus metastatic disease.  Right apical posterior-cavitary lesion/fluid collection improved-s/p drain [see below]-?  Progression/see below  #Given the progressive shortness of breath:  ?  Progression of malignancy versus poorly controlled COPD.  Plan CT scan chest in 1 week.  Called in a prescription for  DuoNeb nebulizer.  Also recommend follow-up with pulmonary regarding adjustment of her medications.  #Bilateral lower extremity DVT[April 03, 2020]-currently on Eliquis 2.5 mg twice daily.  Stable  #Infectious disease: Bilateral MAC/persistent-poor tolerance to amikacin; discontinued.  Right upper lobe cavitary lesion s/p drain-Aspergillus.  Clinically stable.  #Anemia secondary to iron deficiency ? Etiology-hemoglobin today is 11.2.  Patient significantly improved post IV iron infusions.  Proceed with Venofer today.  # COVID vaccination- delines.   #Chronic chest wall pain/chronic cough ; new prescription for tramadol given.  Tessalon Perles.  #Antidepressant-continue dose of Remeron to 30 mg nightly; also should help with her appetite/weight gain [patient approximately 80 pounds] stable.  * will call with CT results # DISPOSITION: # Venofer today # CT scan- 1 week # follow up in 74month MD; labs- cbc/bmp;CRP; iron studies.ferritin; ;  possible venofer- Dr.B.  Cc: Drs. Kasa/Ravikrishnan/ Crystal/ Hedrick.     Orders Placed This Encounter  Procedures  . CT Chest Wo Contrast    Standing Status:   Future    Standing Expiration Date:   03/09/2022    Order Specific Question:   Preferred imaging location?    Answer:   [facility]  . CBC with Differential/Platelet    Standing Status:   Future    Standing Expiration Date:   03/09/2022  . Basic metabolic panel    Standing Status:   Future    Standing Expiration Date:   03/09/2022  . Iron and TIBC    Standing Status:   Future    Standing Expiration Date:   03/09/2022  . Ferritin    Standing Status:   Future    Standing Expiration Date:   03/09/2022  . C-reactive protein    Standing Status:   Future  Standing Expiration Date:   03/09/2022   All questions were answered. The patient knows to call the clinic with any problems, questions or concerns.      Cammie Sickle, MD 03/09/2021 2:27 PM

## 2021-03-09 NOTE — Patient Instructions (Signed)

## 2021-03-09 NOTE — Progress Notes (Signed)
Having some tiredness, feeling worn down, and has no energy. States it has been going on for a couple of weeks. She has been having SOB that has gotten worse. Wants to ask about Breztri to help with COPD.

## 2021-03-10 LAB — C-REACTIVE PROTEIN: CRP: 8 mg/dL — ABNORMAL HIGH (ref <1.0)

## 2021-03-11 ENCOUNTER — Other Ambulatory Visit: Payer: Self-pay | Admitting: Internal Medicine

## 2021-03-14 ENCOUNTER — Telehealth: Payer: Self-pay | Admitting: Cardiothoracic Surgery

## 2021-03-14 NOTE — Telephone Encounter (Signed)
Patient is calling said she would like to speak with you just when you have a moment. Please call patient and advise.

## 2021-03-15 ENCOUNTER — Other Ambulatory Visit: Payer: Self-pay | Admitting: *Deleted

## 2021-03-15 NOTE — Telephone Encounter (Signed)
Called patient-provided ostomy supplies for her.

## 2021-03-16 ENCOUNTER — Telehealth: Payer: Self-pay

## 2021-03-16 ENCOUNTER — Encounter: Payer: Self-pay | Admitting: Internal Medicine

## 2021-03-16 MED ORDER — APIXABAN 2.5 MG PO TABS: ORAL_TABLET | ORAL | 2 refills | Status: DC

## 2021-03-16 NOTE — Telephone Encounter (Signed)
Left message for Caryl Pina to return my call-need to ask about referring this patient.

## 2021-03-21 ENCOUNTER — Ambulatory Visit
Admission: RE | Admit: 2021-03-21 | Discharge: 2021-03-21 | Disposition: A | Discharge status: Home / Self Care | Payer: PPO | Source: Ambulatory Visit | Attending: Internal Medicine | Admitting: Internal Medicine

## 2021-03-21 ENCOUNTER — Other Ambulatory Visit: Payer: Self-pay

## 2021-03-21 DIAGNOSIS — J479 Bronchiectasis, uncomplicated: Secondary | ICD-10-CM | POA: Diagnosis not present

## 2021-03-21 DIAGNOSIS — C3411 Malignant neoplasm of upper lobe, right bronchus or lung: Secondary | ICD-10-CM | POA: Insufficient documentation

## 2021-03-21 DIAGNOSIS — I2510 Atherosclerotic heart disease of native coronary artery without angina pectoris: Secondary | ICD-10-CM | POA: Diagnosis not present

## 2021-03-21 DIAGNOSIS — J941 Fibrothorax: Secondary | ICD-10-CM | POA: Diagnosis not present

## 2021-03-21 DIAGNOSIS — S2241XA Multiple fractures of ribs, right side, initial encounter for closed fracture: Secondary | ICD-10-CM | POA: Diagnosis not present

## 2021-03-22 ENCOUNTER — Telehealth: Payer: Self-pay

## 2021-03-22 NOTE — Telephone Encounter (Signed)
Call made to patient at this time-patient states she has received supplies from [facility]- I let her know to reorder supplies as needed.

## 2021-03-23 ENCOUNTER — Telehealth: Payer: Self-pay

## 2021-03-23 NOTE — Telephone Encounter (Signed)
Poke with patient-she was instructed to follow up with chest tube per [facility]. She will call Dr.Fozia Humphrey Rolls.

## 2021-03-24 ENCOUNTER — Telehealth: Payer: Self-pay | Admitting: Internal Medicine

## 2021-03-24 NOTE — Telephone Encounter (Signed)
Spoke to patient, who states that oncology recommended that she has a follow up with Dr. Mortimer Fries. First availably appt with Dr. Mortimer Fries is 05/04/2021. She feels that this is too far out. No sooner availability with NP. She is not willing to travel to [facility] to see NP sooner.   Dr. Mortimer Fries, please advise. Thanks

## 2021-03-24 NOTE — Telephone Encounter (Signed)
On 6/23-spoke to patient regarding results of the CT scan shows signs of progressive infection.  Recommend repeat evaluation with the pulmonologist/infectious disease doctor.  GB

## 2021-03-28 NOTE — Telephone Encounter (Signed)
Patient is aware of below message and voiced her understanding.  Nothing further needed at this time.   

## 2021-03-31 ENCOUNTER — Other Ambulatory Visit: Payer: Self-pay

## 2021-03-31 ENCOUNTER — Ambulatory Visit: Payer: PPO | Attending: Infectious Diseases | Admitting: Infectious Diseases

## 2021-03-31 VITALS — BP 100/68 | HR 107 | Temp 97.7°F | Resp 16 | Ht 60.0 in | Wt 81.0 lb

## 2021-03-31 DIAGNOSIS — R5381 Other malaise: Secondary | ICD-10-CM | POA: Diagnosis not present

## 2021-03-31 DIAGNOSIS — D649 Anemia, unspecified: Secondary | ICD-10-CM | POA: Diagnosis not present

## 2021-03-31 DIAGNOSIS — Z7901 Long term (current) use of anticoagulants: Secondary | ICD-10-CM | POA: Insufficient documentation

## 2021-03-31 DIAGNOSIS — Z801 Family history of malignant neoplasm of trachea, bronchus and lung: Secondary | ICD-10-CM | POA: Insufficient documentation

## 2021-03-31 DIAGNOSIS — J852 Abscess of lung without pneumonia: Secondary | ICD-10-CM | POA: Diagnosis not present

## 2021-03-31 DIAGNOSIS — Z87891 Personal history of nicotine dependence: Secondary | ICD-10-CM | POA: Diagnosis not present

## 2021-03-31 DIAGNOSIS — Z923 Personal history of irradiation: Secondary | ICD-10-CM | POA: Insufficient documentation

## 2021-03-31 DIAGNOSIS — Z85118 Personal history of other malignant neoplasm of bronchus and lung: Secondary | ICD-10-CM | POA: Diagnosis not present

## 2021-03-31 DIAGNOSIS — J449 Chronic obstructive pulmonary disease, unspecified: Secondary | ICD-10-CM | POA: Insufficient documentation

## 2021-03-31 DIAGNOSIS — R627 Adult failure to thrive: Secondary | ICD-10-CM | POA: Insufficient documentation

## 2021-03-31 DIAGNOSIS — Z79899 Other long term (current) drug therapy: Secondary | ICD-10-CM | POA: Insufficient documentation

## 2021-03-31 DIAGNOSIS — Z7951 Long term (current) use of inhaled steroids: Secondary | ICD-10-CM | POA: Insufficient documentation

## 2021-03-31 DIAGNOSIS — Z681 Body mass index (BMI) 19 or less, adult: Secondary | ICD-10-CM | POA: Diagnosis not present

## 2021-03-31 DIAGNOSIS — Z888 Allergy status to other drugs, medicaments and biological substances status: Secondary | ICD-10-CM | POA: Insufficient documentation

## 2021-03-31 NOTE — Patient Instructions (Signed)
You are here to see me on the request of Dr.B. your latest CT scan had wosening lung abscess. You already have a catheter in the lung to drian the abscess= you are very clear that you dont need antibiotics- You had MAC and aspergillus- for the former antibiotics gave you side effects- the latter you were not able to purchase the medicine and also you were concerned about the side effects. So I would recommend that unless you have a fever, or change in your sputum, or worsening SOB we will not give any antibiotics. You may talk to Vonna Kotyk Borders at the cancer center to discuss your treatment goals.  I have discussed with Dr.B

## 2021-03-31 NOTE — Progress Notes (Signed)
NAME: Claudia Chavez  DOB: 11/02/1955  MRN: [id number]  Date/Time: 03/31/2021 11:38 AM   Subjective:   Follow up visit   Pt is here referred by Dr.Brahmandy Patient with history of lung carcinoma non-small cell right lung cancer in July 2016 and underwent 2 cycles of chemotherapy with Taxol and carboplatin  .  She developed  neuropathy.  And chemo was stopped.  She had right lower lobe resection  in November 2016 at [facility] by Dr. Faith Rogue.  This was followed by radiation therapy to the right lung in February 2017 for 30 days. COPD, thick walled cavity of rt lung Mac infection and was treated with 16 months of triple therapy with no improvement  but gave her serious side effects leading to 16 pound weight loss and she did not want to take antibiotics any more in June  2021.  She saw Dr.oaks was admitted and underwent CT guided drainage of the loculated fluid in the rt lung on 03/29/20- cultures sent had aspergillus She saw me in Nov 2021 but could not afford voriconazole and also because of side effects profile she did not want to take any meds She has been coasting along okay with SOB, debility But her appetite has improved and she had gained some weight. She had a CT scan done by Dr.Brahmandy on 03/21/21 and it showed Increasingly thick-walled cavitary collection of air in the right hemithorax with increasing internal debris and direct connection with the right upper lobe bronchus. Percutaneous drain is seen within the inferior portion of this air collection. 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  Pt has no fever, she says the catheter in the rt hemithorax is draining well She has some productive sputum She does feel sob on exertion but oxygenating > 90 so not eligible for home oxygen Pt says she does not want to take any more antibiotics that would make her sick and cause her to lose weight- she is not interested in treatment for MAC or  aspergillus which could be colonizing the cavity  She drove herself to the visit Lives on her own Does not go out much except for doc visits and pharmacy- Her sister in law and neighbors get grocery and food She eats Frozen/microwave meals Past Medical History:  Diagnosis Date   Cancer of lower lobe of right lung ([place]) 05/07/2015   COPD (chronic obstructive pulmonary disease) ([place])    Dyspnea    Non-small cell lung cancer ([place])    Pneumonia 04/2015   Pulmonary Mycobacterium avium complex (MAC) infection ([place]) 10/25/2018    Past Surgical History:  Procedure Laterality Date   ABDOMINAL HYSTERECTOMY     COLONOSCOPY WITH PROPOFOL N/A 06/09/2018   Procedure: COLONOSCOPY WITH PROPOFOL;  Surgeon: Toledo, Benay Pike, MD;  Location: [facility] ENDOSCOPY;  Service: Gastroenterology;  Laterality: N/A;   ELBOW SURGERY Right 1995   ELECTROMAGNETIC NAVIGATION BROCHOSCOPY N/A 10/25/2017   Procedure: ELECTROMAGNETIC NAVIGATION BRONCHOSCOPY;  Surgeon: [name], MD;  Location: [facility] ORS;  Service: Cardiopulmonary;  Laterality: N/A;   ESOPHAGOGASTRODUODENOSCOPY N/A 06/08/2018   Procedure: ESOPHAGOGASTRODUODENOSCOPY (EGD);  Surgeon: Toledo, Benay Pike, MD;  Location: [facility] ENDOSCOPY;  Service: Gastroenterology;  Laterality: N/A;   FLEXIBLE BRONCHOSCOPY Right 10/10/2018   Procedure: FLEXIBLE BRONCHOSCOPY;  Surgeon: [name], MD;  Location: [facility] ORS;  Service: Cardiopulmonary;  Laterality: Right;   PORTACATH PLACEMENT Right 05/10/2015   Procedure: INSERTION PORT-A-CATH;  Surgeon: Nestor Lewandowsky, MD;  Location: [facility] ORS;  Service: General;  Laterality: Right;  VIDEO ASSISTED THORACOSCOPY (VATS)/THOROCOTOMY Right 08/18/2015   Procedure: VIDEO ASSISTED THORACOSCOPY (VATS)/THOROCOTOMY;  Surgeon: Nestor Lewandowsky, MD;  Location: [facility] ORS;  Service: General;  Laterality: Right;    Social History   Socioeconomic History   Marital status: Widowed    Spouse name: Not on file   Number of children: Not on file   Years of  education: Not on file   Highest education level: Not on file  Occupational History   Not on file  Tobacco Use   Smoking status: Former    Packs/day: 0.50    Years: 40.00    Pack years: 20.00    Types: Cigarettes    Quit date: 12/01/2019    Years since quitting: 1.3   Smokeless tobacco: Never  Vaping Use   Vaping Use: Never used  Substance and Sexual Activity   Alcohol use: Not Currently    Alcohol/week: 2.0 - 4.0 standard drinks    Types: 2 - 4 Cans of beer per week    Comment: beer-occasionally   Drug use: No   Sexual activity: Never  Other Topics Concern   Not on file  Social History Narrative   Not on file   Social Determinants of Health   Financial Resource Strain: Not on file  Food Insecurity: Not on file  Transportation Needs: Not on file  Physical Activity: Not on file  Stress: Not on file  Social Connections: Not on file  Intimate Partner Violence: Not on file    Family History  Problem Relation Age of Onset   Stroke Mother    Lung cancer Father 67   Allergies  Allergen Reactions   Keppra [Levetiracetam] Other (See Comments)    Nausea and dizzy   Marinol [Dronabinol] Other (See Comments)    Dizziness; bad dreams   Lyrica [Pregabalin] Other (See Comments)    Made patient feel very dizzy and not feel good.     ? Current Outpatient Medications  Medication Sig Dispense Refill   albuterol (PROVENTIL HFA;VENTOLIN HFA) 108 (90 Base) MCG/ACT inhaler Inhale 2 puffs into the lungs every 6 (six) hours as needed for wheezing or shortness of breath.      apixaban (ELIQUIS) 2.5 MG TABS tablet TAKE 1 TABLET(2.5 MG) BY MOUTH TWICE DAILY 60 tablet 2   benzonatate (TESSALON) 100 MG capsule Take 1 capsule (100 mg total) by mouth 3 (three) times daily as needed for cough. 90 capsule 3   Cholecalciferol (VITAMIN D3) 1000 units CAPS Take 2,000 Units by mouth daily.      Fluticasone-Salmeterol (ADVAIR) 250-50 MCG/DOSE AEPB Inhale 1 puff into the lungs every 12 (twelve)  hours.      ipratropium-albuterol (DUONEB) 0.5-2.5 (3) MG/3ML SOLN Take 3 mLs by nebulization every 4 (four) hours as needed. 360 mL 3   mirtazapine (REMERON) 30 MG tablet Take 1 tablet (30 mg total) by mouth at bedtime. 30 tablet 3   ondansetron (ZOFRAN) 4 MG tablet Take 1 tablet (4 mg total) by mouth every 8 (eight) hours as needed for nausea or vomiting. 45 tablet 3   Respiratory Therapy Supplies (FLUTTER) DEVI 1 each by Does not apply route daily. 1 each 0   traMADol (ULTRAM) 50 MG tablet Take 1 tablet (50 mg total) by mouth every 12 (twelve) hours as needed for moderate pain or severe pain. 60 tablet 0   vitamin B-12 (CYANOCOBALAMIN) 1000 MCG tablet Take 1,000 mcg by mouth daily.     No current facility-administered medications for this visit.  Facility-Administered Medications Ordered in Other Visits  Medication Dose Route Frequency Provider Last Rate Last Admin   sodium chloride flush (NS) 0.9 % injection 10 mL  10 mL Intravenous PRN Cammie Sickle, MD         Abtx:  Anti-infectives (From admission, onward)    None       REVIEW OF SYSTEMS:  No fever or chills Has sob even on minimal exertion No diarrhea appetite okay, has gained some weight Energy better   Objective:  VITALS:  BP 100/68   Pulse (!) 107   Temp 97.7 F (36.5 C) (Oral)   Resp 16   Ht 5' (1.524 m)   Wt 81 lb (36.7 kg)   SpO2 99%   BMI 15.82 kg/m    PHYSICAL EXAM:  General: stable, no distress at rest emaciated , chronically ill Head: Normocephalic, without obvious abnormality, atraumatic. Eyes: Conjunctivae clear, anicteric sclerae. Pupils are equal ENT no yeast infection, throat normal Neck: Supple, symmetrical, no adenopathy, thyroid: non tender no carotid bruit and no JVD. Back: No CVA tenderness. Lungs: decreased air entry rt side  Chest drain- purulent fluid Heart: S1-S2 Abdomen: Soft, non-tender,not distended. Bowel sounds normal. No masses Extremities: atraumatic, no  cyanosis. No edema. No clubbing Skin: No rashes or lesions. Or bruising Lymph: Cervical, supraclavicular normal. Neurologic: Grossly non-focal Pertinent Labs Lab Results CBC    Component Value Date/Time   WBC 8.8 03/09/2021 1250   RBC 3.51 (L) 03/09/2021 1250   HGB 11.2 (L) 03/09/2021 1250   HCT 34.1 (L) 03/09/2021 1250   PLT 386 03/09/2021 1250   MCV 97.2 03/09/2021 1250   MCH 31.9 03/09/2021 1250   MCHC 32.8 03/09/2021 1250   RDW 14.1 03/09/2021 1250   LYMPHSABS 1.4 03/09/2021 1250   MONOABS 0.7 03/09/2021 1250   EOSABS 0.1 03/09/2021 1250   BASOSABS 0.1 03/09/2021 1250    CMP Latest Ref Rng & Units 03/09/2021 12/08/2020 10/27/2020  Glucose 70 - 99 mg/dL 106(H) 125(H) 99  BUN 8 - 23 mg/dL _0 Creatinine 0.44 - 1.00 mg/dL 0.51 0.53 0.41(L)  Sodium 135 - 145 mmol/L 129(L) 131(L) 132(L)  Potassium 3.5 - 5.1 mmol/L 4.4 3.8 4.0  Chloride 98 - 111 mmol/L 95(L) 98 99  CO2 22 - 32 mmol/L _1 Calcium 8.9 - 10.3 mg/dL 8.6(L) 8.8(L) 8.6(L)  Total Protein 6.5 - 8.1 g/dL - - -  Total Bilirubin 0.3 - 1.2 mg/dL - - -  Alkaline Phos 38 - 126 U/L - - -  AST 15 - 41 U/L - - -  ALT 0 - 44 U/L - - -      Microbiology: 10/10/2018 acid-fast organism ID by nucleic acid amplification test is M avium complex.     Sept 2020     Impression/Recommendation 65 y.o.female  with a history of Lung cancer , COPD   Fibrocavitary  lesions in the right lung.  Was treated for Pulmonary MAC for 14 months with no improvement- underwent chest drain placement on 03/19/20 for lung abscess Aspergillus fumigatus +  fungitell, aspergillus antigen and antibody are mildly elevated - voriconazole prescription wa sgiven but it was expensive and looking at the side effect profile  she did not want to take another medicine that would give many side effects She is very clear today that she does not want to take any antibiotics unless she is acutely ill She says she has a living will and is a DNR. Told  her to discuss treatment goals with oncologist   Right lung carcinoma.  Status post right lower lobectomy, radiation, recurrence, SBRT.  Followed by Dr. Yevette Edwards and Dr. Mortimer Fries.  Failure to thrive  COPD on inhalers and nebulizers  Anemia.  Brendolyn Patty Smoker: Quit smoking December 2019  Discussed the management with patient and Dr.Brahmandy Need to meet with Altha Harm at onc center and discuss treatment goals Will see her PRN

## 2021-04-08 ENCOUNTER — Ambulatory Visit: Payer: PPO | Admitting: Thoracic Surgery (Cardiothoracic Vascular Surgery)

## 2021-04-09 ENCOUNTER — Telehealth: Payer: Self-pay | Admitting: Internal Medicine

## 2021-04-09 DIAGNOSIS — C3411 Malignant neoplasm of upper lobe, right bronchus or lung: Secondary | ICD-10-CM

## 2021-04-09 NOTE — Telephone Encounter (Signed)
On 7/07-I discussed with the patient the results of June 2022 CT scan-no obvious malignant findings-however in the midst of progressive/infectious findings recurrent malignancy is difficult to rule out.  I had a long discussion the patient regarding my discussion with Dr. Steva Ready given the progressive infectious findings noted on the CT scan.  Patient tolerated antifungals poorly in the past; and felt not be not a candidate for any recurrent antifungal therapy.  As such patient's prognosis is poor especially context of severe COPD.  Patient canceled her appointment with thoracic surgery; however wants to keep her appointment with pulmonary, Dr.Kasa as planned.  With patient's questions with regards to prognosis-I did confirm that prognosis is poor and unfortunately she will die from her lung disease.  However I would defer further prognostication to pulmonary/ID.   Josh-patient is interested in home palliative care referral.  It is okay with you-please inform/ and make a referral Rae Lips evaluation ASAP.  Thanks GB

## 2021-04-11 ENCOUNTER — Telehealth: Payer: Self-pay | Admitting: Nurse Practitioner

## 2021-04-11 ENCOUNTER — Other Ambulatory Visit: Payer: Self-pay | Admitting: Hospice and Palliative Medicine

## 2021-04-11 DIAGNOSIS — C3411 Malignant neoplasm of upper lobe, right bronchus or lung: Secondary | ICD-10-CM

## 2021-04-11 NOTE — Addendum Note (Signed)
Addended by: Gloris Ham on: 04/11/2021 08:30 AM   Modules accepted: Orders

## 2021-04-11 NOTE — Telephone Encounter (Signed)
Spoke with patient regarding the Palliative referral/services and all questions were answered and she was in agreement with starting services.  I have scheduled an In-home Consult for 04/26/21 @ 12:30 PM

## 2021-04-12 ENCOUNTER — Telehealth: Payer: Self-pay | Admitting: Nurse Practitioner

## 2021-04-12 ENCOUNTER — Encounter: Payer: Self-pay | Admitting: Internal Medicine

## 2021-04-12 NOTE — Telephone Encounter (Signed)
Palliative NP requested I contact patient and reschedule the Palliative Consult for a sooner visit.  Spoke with patient and she was in agreement with rescheduling the Consult to 04/18/21 @ 11:30 AM.

## 2021-04-14 ENCOUNTER — Inpatient Hospital Stay: Payer: PPO | Attending: Hospice and Palliative Medicine | Admitting: Hospice and Palliative Medicine

## 2021-04-14 DIAGNOSIS — C3411 Malignant neoplasm of upper lobe, right bronchus or lung: Secondary | ICD-10-CM | POA: Diagnosis not present

## 2021-04-14 MED ORDER — TRAMADOL HCL 50 MG PO TABS: 50.0000 mg | ORAL_TABLET | Freq: Two times a day (BID) | ORAL | 0 refills | Status: DC | PRN

## 2021-04-14 NOTE — Progress Notes (Signed)
Virtual Visit via Telephone Note  I connected with Claudia Chavez on 04/14/21 at 11:30 AM EDT by telephone and verified that I am speaking with the correct person using two identifiers.  Location: Patient: Home Provider: Clinic   I discussed the limitations, risks, security and privacy concerns of performing an evaluation and management service by telephone and the availability of in person appointments. I also discussed with the patient that there may be a patient responsible charge related to this service. The patient expressed understanding and agreed to proceed.   History of Present Illness: [name] is a 65 year old woman with multiple medical problems including severe COPD, history of non-small cell right lung cancer status postchemotherapy and right lower lobe resection and adjuvant XRT.  Unfortunately, she has chronic issues with cavitary infection requiring drainage and multiple previous courses of antibiotics.  Patient is followed by ID.  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  Patient is not interested in further antibiotics.  This is presumed to be a terminal condition and palliative care was consulted to help address goals and coordinate care   Observations/Objective: I spoke with patient and sister-in-law today, first by video and then by phone.  Patient tells me of her conversation with Dr. Rogue Bussing and recognizes that her pulmonary problems are expected to eventually result in her end-of-life.  Currently, she feels that she is doing reasonably well.  She lives at home alone and has a sister-in-law who helps regularly.  Patient is currently able to provide for her own care including bathing and dressing.  Symptomatically,  patient endorses dyspnea on exertion.  However, this improves rapidly once she sits down to rest.  She also endorses occasional chest wall pain, for which she takes tramadol.  Patient has a cough at night that is well managed on benzonatate.  We discussed the option of hospice involvement when clinically ready.  Patient seemed interested in hospice in the future but does not feel that she requires their services right now.  Patient is pending community palliative care visit next week.  Patient does request social work involvement to coordinate obtaining a medical alert system in the home. Message sent to Natalia Leatherwood, NP.   Patient living will is on file.  Her HC POA is her sister-in-law.  Patient states clearly that she would not want to be resuscitated or have her life prolonged artificially on machines.  She is in agreement with DNR/DNI.  I mailed a DNR order to her home today.  Assessment and Plan: Fiber cavitary lesions in the right lung -probable superimposed infection.  Followed by ID.  Pulmonary status is expected to decline over time.  Patient to be followed by community palliative care and will probably benefit from future hospice involvement.  Neoplasm related pain -refill tramadol #60  ACP - DNR mailed to patient's home  Follow Up Instructions: Follow-up MyChart visit 1 month   I discussed the assessment and treatment plan with the patient. The patient was provided an opportunity to ask questions and all were answered. The patient agreed with the plan and demonstrated an understanding of the instructions.   The patient was advised to call back or seek an in-person evaluation if the symptoms worsen or if the condition fails to improve as anticipated.  I provided 15  minutes of non-face-to-face time during this encounter.   Irean Hong, NP

## 2021-04-18 ENCOUNTER — Other Ambulatory Visit: Payer: Self-pay

## 2021-04-18 ENCOUNTER — Encounter: Payer: Self-pay | Admitting: Nurse Practitioner

## 2021-04-18 ENCOUNTER — Other Ambulatory Visit: Payer: PPO | Admitting: Nurse Practitioner

## 2021-04-18 DIAGNOSIS — Z515 Encounter for palliative care: Secondary | ICD-10-CM | POA: Diagnosis not present

## 2021-04-18 DIAGNOSIS — R0602 Shortness of breath: Secondary | ICD-10-CM

## 2021-04-18 NOTE — Progress Notes (Signed)
[facility] Palliative Care Consult Note Telephone: [phone]  Fax: [phone]   Date of encounter: 04/18/21 PATIENT NAME: Claudia Chavez [id number]   [phone] (home)  DOB: Sep 03, 1956 MRN: [id number] PRIMARY CARE PROVIDER:    [place]land Pink, MD,  [address] [place] [id number] [phone]  RESPONSIBLE PARTY:    Contact Information     Name Relation Home Work Mobile   Claudia Chavez Sister [phone]        I met face to face with patient and family in home. Palliative Care was asked to follow this patient by consultation request of  Claudia Pink, MD to address advance care planning and complex medical decision making. This is the initial visit.  ASSESSMENT AND PLAN / RECOMMENDATIONS:   Symptom Management/Plan: ACP: DNR in place, in vynca 2. Shortness of breath secondary to progressive of malignancy vs poorly controlled COPD; continue nebulizer treatments; albuterol HFA, Pulmonology consult, would benefit from supplement O2 difficulty qualifying as she does not drop her sats with exertion, though symptomatic. 3. Goals of Care: Goals include to maximize quality of life and symptom management. Our advance care planning conversation included a discussion about:    The value and importance of advance care planning  Exploration of personal, cultural or spiritual beliefs that might influence medical decisions  Exploration of goals of care in the event of a sudden injury or illness  Identification and preparation of a healthcare agent  Review and updating or creation of an advance directive document. 4. Palliative care encounter; Palliative care encounter; Palliative medicine team will continue to support patient, patient's family, and medical team. Visit consisted of counseling and education dealing with the complex and emotionally intense issues of symptom management and palliative care in  the setting of serious and potentially life-threatening illness 5. f/u 1 month for ongoing monitoring chronic disease progression, ongoing discussions complex medical decision making  Follow up Palliative Care Visit: Palliative care will continue to follow for complex medical decision making, advance care planning, and clarification of goals. Return 4 weeks or prn.  I spent 75 minutes providing this consultation. More than 50% of the time in this consultation was spent in counseling and care coordination.  PPS: 50%  Chief Complaint: Initial palliative consult for complex medical decision making  HISTORY OF PRESENT ILLNESS:  Claudia Chavez is a 65 y.o. year old female  with with multiple medical problems including severe COPD, history of non-small cell right lung cancer status postchemotherapy and right lower lobe resection and adjuvant XRT.  Chronic issues with cavitary infection requiring drainage and multiple previous courses of antibiotics.  Patient is followed by ID.  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  Patient is not interested in further antibiotics. Claudia Chavez saw Dr Rogue Chavez Oncology on 03/09/2021 with current weight 81 lbs; plan as follows for Malignant neoplasm of right upper lobe of lung with progressive shortness of breath, duoneb nebulizer; continue with pulmonary; concern for progressive of malignancy vs poorly controlled COPD; right apical posterior cavity lesion/fluid collection improved with drain which continues to remain intact. Infectious disease bilateral MAC with persistent poor tolerance to amikacin which was d/c. Right upper lobe cavitary lesion s/p drain aspergillus, stable; Bilateral DVT remains on eliquis.  Anemia secondary to  iron deficiency ?etiology with hgb stable, proceeded with venofer. Chronic chest wall pain with chronic cough,continue tessalon pearls with tramadol for pain. Remeron started for antidepressant also hopes weight gain and appetite. In home initial PC visit, I called Claudia Chavez to confirm [facility] visit and covid screening negative. I visits Claudia Chavez in her home with her sister, sister-in law Claudia Chavez, son in law. We talked about purpose PC in home as she continues to be seen at [facility]. We talked about how Claudia Chavez has been feeling. Claudia Chavez endorses she has been continuing to have shortness of breath. We talked about shortness of breath using nebulizer which does help, energy conservation, inhaler. We talked about upcoming appointment with Dr Mortimer Fries, pulmonology. We talked about past medical history, life review, dx cancers, talked extensively about current situation with fluid, infection and unable to treat with antibiotic. We talked about Claudia Chavez functional abilities, she continues to drive short distances. Claudia Chavez endorses she gets herself dressed, bathes with assistance from her sister in law Claudia Chavez. Claudia Chavez does attend her physician appointments as well as provide groceries, meals, shopping. We talked about appetite which varies. Claudia Chavez endorses she has plenty of food, just eating. Her appetite has improved with slight weight gain. Claudia Chavez endorses her weight previously has gotten down to 76lbs, now back up to 83lbs and her life baseline weight was around 98 to 100lbs. We talked about medical goals of care. We talked about disease progression what that would look like for Claudia Chavez as far as caregivers in the home, or facility. We talked about resources Claudia Chavez and family already have in place. We talked about life alert button, discussed will ask Claudia Chavez SW PC to contact for further information about life alert. We talked about Claudia Chavez's understanding of progressive shortness of breath with  progression of malignancy versus poorly controlled COPD. We talked about Medicare benefit of Hospice services. We talked about what services Hospice provides, how service works. We talked about PC, role in poc. We talked about seeing what the results are from Pulmonary visit with recommendations for shortness of breath. Ms. Croghan talked at length about when she becomes very fatigue, short of breath her O2 sats remain in upper 90's. We talked about possible Pulmonology could do other testing to quality for O2 supplemental. Ms. Dethlefs endorses she will further discuss with Dr Mortimer Fries. Will have PC RN f/u after Pulmonary visit then [facility] NP visit 4 weeks. Ms. Rotert in agreement. Therapeutic listening, emotional support provided. Questions answered.   History obtained from review of EMR, discussion with sister, sister in law, son in law Ms. Dues.  I reviewed available labs, medications, imaging, studies and related documents from the EMR.  Records reviewed and summarized above.   Oncology History Overview Note  # Squamous cell carcinoma of right  LOWER LOBE OF  lung stage IIIA based on PET scan Biopsy of the (August of 2016). 2.  After 3 cycles of chemotherapy patient underwent resection of lung mass (November, 2016) ypT2B   ypN0 [Dr.Oaks]  status post right lower lobectomy with a 3. She  was started on carboplatinum and Taxol on a weekly basis and radiation therapy from January of 2017 4. After 2 cycles of carboplatinum patient had neuropathy grade 2 interfering with work so chemotherapy was put on hold and radiation was continued (January 19th, 2017) 5.  Chemotherapy was discontinued because of progressive neuropathy.  Patient is finishing of radiation therapy  on November 19, 2015   # NOV 2017- CT- 16m right hilar LN; PET- Feb 2018- NED.   # DEC-JAN 2019- RUL Lung nodule [s/p Bronch; Dr.Kasa- non-diagnostic] SBRT Feb 2019   #October 10, 2018 bronchoscopy [Dr.Kasa]-negative for malignancy/positive for  MAC infection-ethambutol [Dr.Ravishankar]; [facility]  # AUG 2019- R LE DVT [xarelto]x STOP in end of dec 2019. Syncope- MRI- 13 mm cystic leision/asymptomatic/[facility]; Bil subacute cerebellar stroke;  AUG 2019 - GIB/anemia [EGD/colo- Dr.Toledo; NEG]; Aug 2019-  Severe hypokalemia- sec to Florinef-resolved.   DIAGNOSIS: RLL stage III lung ca; RUL stage I   GOALS: curative   CURRENT/MOST RECENT THERAPY : Surveillaince       Malignant neoplasm of lung (HCC)  Malignant neoplasm of right upper lobe of lung (HCC)     ROS Full 14 system review of systems performed and negative with exception of: as per HPI.   Physical Exam: Constitutional: NAD General: frail appearing, thin, pleasant female EYES: lids intact ENMT: oral mucous membranes moist CV: S1S2, RRR, no LE edema Pulmonary:  breath sounds decreased right LL; left LL with few crackles Abdomen: soft and non tender MSK: ambulatory Skin: warm and dry, no rashes or wounds on visible skin Neuro:  no generalized weakness,  no cognitive impairment Psych: non-anxious affect, A and O x 3  CURRENT PROBLEM LIST:  Patient Active Problem List   Diagnosis Date Noted   Iron deficiency 07/29/2020   DVT of lower limb, acute ([place] 04/03/2020   Chronic obstructive pulmonary disease (HCC)    DVT of lower extremity, bilateral ([place] 04/02/2020   Protein-calorie malnutrition, severe 03/30/2020   Empyema ([place] 03/26/2020   Pulmonary Mycobacterium avium complex (MAC) infection ([place] 10/25/2018   Lung mass    Brain lesion 08/20/2018   History of stroke 08/20/2018   Malignant neoplasm of right upper lobe of lung (HF[place] Lakes 06/21/2018   Hypokalemia 06/21/2018   Orthostasis 06/11/2018   Chronic deep vein thrombosis (DVT) of distal vein of lower extremity (HCC) 06/11/2018   Symptomatic anemia 06/07/2018   Gastrointestinal hemorrhage 06/07/2018   CVA (cerebral vascular accident) ([place] 05/24/2018   Syncope 05/24/2018   Lung nodule     Chronic bronchitis ([place] 10/21/2015   Malignant neoplasm of lung ([place] 05/07/2015   Bronchitis, chronic (HCC) 04/28/2015   Osteoporosis 04/28/2015   Overactive bladder 04/28/2015   Situational depression 04/28/2015   Carpal tunnel syndrome 07/13/2014   Cervical radiculitis 07/13/2014   Cervical spinal stenosis 07/13/2014   Carpal tunnel syndrome of left wrist 07/13/2014   PAST MEDICAL HISTORY:  Active Ambulatory Problems    Diagnosis Date Noted   Carpal tunnel syndrome 07/13/2014   Cervical radiculitis 07/13/2014   Bronchitis, chronic (HCC) 04/28/2015   Cervical spinal stenosis 07/13/2014   Osteoporosis 04/28/2015   Overactive bladder 04/28/2015   Situational depression 04/28/2015   Malignant neoplasm of lung (H[place] 05/07/2015   Carpal tunnel syndrome of left wrist 07/13/2014   Chronic bronchitis (HH[place] 10/21/2015   Lung nodule    CVA (cerebral vascular accident) ([place] 05/24/2018   Syncope 05/24/2018   Symptomatic anemia 06/07/2018   Gastrointestinal hemorrhage 06/07/2018   Orthostasis 06/11/2018   Chronic deep vein thrombosis (DVT) of distal vein of lower extremity ([facility] 06/11/2018   Malignant neoplasm of right upper lobe of lung ([place] 06/21/2018   Hypokalemia 06/21/2018   Brain lesion 08/20/2018   History of stroke 08/20/2018   Lung mass    Pulmonary Mycobacterium avium complex (MAC) infection ([place] 10/25/2018   Empyema (  [place]) 03/26/2020   Protein-calorie malnutrition, severe 03/30/2020   DVT of lower extremity, bilateral ([place]) 04/02/2020   Chronic obstructive pulmonary disease ([place])    DVT of lower limb, acute ([place]) 04/03/2020   Iron deficiency 07/29/2020   Resolved Ambulatory Problems    Diagnosis Date Noted   No Resolved Ambulatory Problems   Past Medical History:  Diagnosis Date   Cancer of lower lobe of right lung ([place]) 05/07/2015   COPD (chronic obstructive pulmonary disease) (HCC)    Dyspnea    Non-small cell lung cancer ([name])    Pneumonia 04/2015    SOCIAL HX:  Social History   Tobacco Use   Smoking status: Former    Packs/day: 0.50    Years: 40.00    Pack years: 20.00    Types: Cigarettes    Quit date: 12/01/2019    Years since quitting: 1.3   Smokeless tobacco: Never  Substance Use Topics   Alcohol use: Not Currently    Alcohol/week: 2.0 - 4.0 standard drinks    Types: 2 - 4 Cans of beer per week    Comment: beer-occasionally   FAMILY HX:  Family History  Problem Relation Age of Onset   Stroke Mother    Lung cancer Father 11    reviewed  ALLERGIES:  Allergies  Allergen Reactions   Keppra [Levetiracetam] Other (See Comments)    Nausea and dizzy   Marinol [Dronabinol] Other (See Comments)    Dizziness; bad dreams   Lyrica [Pregabalin] Other (See Comments)    Made patient feel very dizzy and not feel good.      PERTINENT MEDICATIONS:  Outpatient Encounter Medications as of 04/18/2021  Medication Sig   albuterol (PROVENTIL HFA;VENTOLIN HFA) 108 (90 Base) MCG/ACT inhaler Inhale 2 puffs into the lungs every 6 (six) hours as needed for wheezing or shortness of breath.    apixaban (ELIQUIS) 2.5 MG TABS tablet TAKE 1 TABLET(2.5 MG) BY MOUTH TWICE DAILY   benzonatate (TESSALON) 100 MG capsule Take 1 capsule (100 mg total) by mouth 3 (three) times daily as needed for cough.   Cholecalciferol (VITAMIN D3) 1000 units CAPS Take 2,000 Units by mouth daily.    Fluticasone-Salmeterol (ADVAIR) 250-50 MCG/DOSE AEPB Inhale 1 puff into the lungs every 12 (twelve) hours.    ipratropium-albuterol (DUONEB) 0.5-2.5 (3) MG/3ML SOLN Take 3 mLs by nebulization every 4 (four) hours as needed.   mirtazapine (REMERON) 30 MG tablet Take 1 tablet (30 mg total) by mouth at bedtime.   ondansetron (ZOFRAN) 4 MG tablet Take 1 tablet (4 mg total) by mouth every 8 (eight) hours as needed for nausea or vomiting.   Respiratory Therapy Supplies (FLUTTER) DEVI 1 each by Does not apply route daily.   traMADol (ULTRAM) 50 MG tablet Take 1 tablet (50 mg  total) by mouth every 12 (twelve) hours as needed for moderate pain or severe pain.   vitamin B-12 (CYANOCOBALAMIN) 1000 MCG tablet Take 1,000 mcg by mouth daily.   Facility-Administered Encounter Medications as of 04/18/2021  Medication   sodium chloride flush (NS) 0.9 % injection 10 mL  Questions and concerns were addressed. The patient/family was encouraged to call with questions and/or concerns. My business card was provided. Provided general support and encouragement, no other unmet needs identified   Thank you for the opportunity to participate in the care of Ms. Daris.  The palliative care team will continue to follow. Please call our office at [phone] if we can be of additional assistance.  This chart was dictated using voice recognition software.  Despite best efforts to proofread,  errors can occur which can change the documentation meaning.   Clem Wisenbaker Z Alano Blasco, NP ,

## 2021-04-26 ENCOUNTER — Other Ambulatory Visit: Payer: Self-pay | Admitting: Nurse Practitioner

## 2021-05-04 ENCOUNTER — Ambulatory Visit: Payer: PPO | Admitting: Internal Medicine

## 2021-05-04 ENCOUNTER — Encounter: Payer: Self-pay | Admitting: Internal Medicine

## 2021-05-04 ENCOUNTER — Other Ambulatory Visit: Payer: Self-pay

## 2021-05-04 VITALS — BP 100/60 | HR 101 | Temp 97.3°F | Ht 61.0 in | Wt 79.4 lb

## 2021-05-04 DIAGNOSIS — J449 Chronic obstructive pulmonary disease, unspecified: Secondary | ICD-10-CM

## 2021-05-04 MED ORDER — TRELEGY ELLIPTA 200-62.5-25 MCG/INH IN AEPB: 200.0000 ug | INHALATION_SPRAY | Freq: Every day | RESPIRATORY_TRACT | 0 refills | Status: DC

## 2021-05-04 MED ORDER — BREZTRI AEROSPHERE 160-9-4.8 MCG/ACT IN AERO: 160.0000 ug | INHALATION_SPRAY | Freq: Two times a day (BID) | RESPIRATORY_TRACT | 0 refills | Status: DC

## 2021-05-04 NOTE — Addendum Note (Signed)
Addended by: Carlisle Cater on: 05/04/2021 10:35 AM   Modules accepted: Orders

## 2021-05-04 NOTE — Progress Notes (Signed)
Name: Claudia Chavez MRN: [id number] DOB: 1955/12/27     CONSULTATION DATE: .1.17.19  REFERRING MD :  Genevive Bi   STUDIES:  CT chest 1.7.19 I have Independently reviewed images of  CT chest   on 05/04/2021 Interpretation:Right upper lobe nodule is grossly stable in size 05/25/2017 but appears more solid in appearance than on 05/25/2017 and is new from 08/07/2016. Findings are worrisome for bronchogenic carcinoma.   CT chest 09/06/18 I have Independently reviewed images of   Interpretation: Right upper lobe thick-walled cavitary process measures approximately 4.2 x 2.9 cm. There is also a right apical cavitary nodule on image number 27 measuring 10.5 mm. Left Lung GGO multifocal  Large thick-walled cavitary lesion in the right lower lobe with an air-fluid level measures 7.2 x 5.7 cm.    SYNOPSIS 65 y.o. female.  Approximately 3 years ago she underwent a right lower lobectomy after induction chemotherapy.  Postoperatively she was treated with radiation therapy and chemotherapy for presumed stage IIIa SQ cell carcinoma of the lung.  RT lung upper lobe nodule was found on surveillance CT s/p ENB 1.24.19 NON-diagnostic PFT/s c/w severe COPD Fev1 55%  TEST/EVENTS :  CT chest 12/2019 - s/p RLL lobectomy , progressive thick walled cavitary lesion in Right posterior pleural space, numerous pulmonary nodules-mildly progressive   10/10/2018 she underwent bronchoscopy and bronchoalveolar lavage was sent for AFB and fungal and bacterial cultures.  The pathology showed BENIGN ALVEOLATED LUNG PARENCHYMA AND BRONCHIAL WALL WITH FOCAL  ORGANIZING FIBRIN. - NO EVIDENCE OF TUMOR OR GRANULOMA.  AFB and GMS special stains were negative. The AFB nucleic acid amplification testing was done and that was positive for Mycobacterium avium intracellulare and she was referred to ID in February 2020.Marland Kitchen Started Anti MAC treatment feb 2020. Repeat Sputum in Sept had MAC and it was still susceptible to macrolide In  Nov 2020 she has stopped meds because of pill fatigue and restarted Dec 2020  Sputum AFB 01/2020 + MAC   03/08/2020 Follow up: COPD, MAC  Patient presents today for follow up for COPD with progressively worsening symptoms of cough, congestion, dyspnea, weakness, decreased activity tolerance, and weight loss.  Last seen 10/2018  By me  She is being followed by ID for MAC with progressive disease and persistent positive sputum AFB despite triple drug therapy .  She was recently started on Amikacin. Since starting Amikacin has nausea, worsening weight loss. Weight down 16lbs, currently 83lbs.  She remains on Advair and Spiriva .  Has daily cough, congestion , thick mucus, dyspnea with minimal activity .  Denies hemoptysis , chest pain , fever, body aches.  Gets very winded with minimal activity . Walk test in the office with no desats .         Chief complaint - Follow up COPD Follow up MAC Follow up chronic hypoxic resp failure  HISTORY OF PRESENT ILLNESS:   Patient has a diagnosis of MAC Followed by ID  progressive abnormal CT scan findings Patient has been significantly declining over the last several years Very weak, fatigued   Persistent cough Persistent shortness of breath Persistent dyspnea on exertion  Patient has multiple rounds of antibiotics for MAC Multiple rounds of COPD exacerbation with antibiotics and prednisone Previous diagnosis with multifocal pneumonia    CT of the chest June 2022 reviewed by me today independently Patient with complete destruction of the right lung with necrotizing pneumonia Patient with percutaneous drain on the right side with purulent secretions Patient drains at 3  times a day   PAST MEDICAL HISTORY :   has a past medical history of Cancer of lower lobe of right lung ([place]) (05/07/2015), COPD (chronic obstructive pulmonary disease) ([place]), Dyspnea, Non-small cell lung cancer ([place]), Pneumonia (04/2015), and Pulmonary Mycobacterium avium  complex (MAC) infection ([place]) (10/25/2018).  has a past surgical history that includes Abdominal hysterectomy; Portacath placement (Right, 05/10/2015); Elbow surgery (Right, 1995); Video assisted thoracoscopy (vats)/thorocotomy (Right, 08/18/2015); Electormagnetic navigation bronchoscopy (N/A, 10/25/2017); Esophagogastroduodenoscopy (N/A, 06/08/2018); Colonoscopy with propofol (N/A, 06/09/2018); and Flexible bronchoscopy (Right, 10/10/2018). Prior to Admission medications   Medication Sig Start Date End Date Taking? Authorizing Provider  Fluticasone-Salmeterol (ADVAIR DISKUS) 250-50 MCG/DOSE AEPB INHALE 1 INHALATION INTO THE LUNGS EVERY 12 (TWELVE) HOURS. 09/05/17  Yes [provider]   Allergies  Allergen Reactions   Keppra [Levetiracetam] Other (See Comments)    Nausea and dizzy   Marinol [Dronabinol] Other (See Comments)    Dizziness; bad dreams   Lyrica [Pregabalin] Other (See Comments)    Made patient feel very dizzy and not feel good.      Review of Systems:  Gen:  Denies  fever, sweats, chills weight loss  HEENT: Denies blurred vision, double vision, ear pain, eye pain, hearing loss, nose bleeds, sore throat Cardiac:  No dizziness, chest pain or heaviness, chest tightness,edema, No JVD Resp:   +cough, -sputum production, +shortness of breath,-wheezing, -hemoptysis,  Other:  All other systems negative   BP 100/60 (BP Location: Left Arm, Patient Position: Sitting, Cuff Size: Normal)   Pulse (!) 101   Temp (!) 97.3 F (36.3 C) (Oral)   Ht _0  (1.549 m)   Wt 79 lb 6.4 oz (36 kg)   SpO2 98%   BMI 15.00 kg/m    Physical Examination:   General Appearance: No distress  EYES PERRLA, EOM intact.   NECK Supple, No JVD Pulmonary: normal breath sounds, No wheezing.  CardiovascularNormal S1,S2.  No m/r/g.   Abdomen: Benign, Soft, non-tender. Skin:   warm, no rashes, no ecchymosis  Extremities: normal, no cyanosis, clubbing. Neuro:without focal findings,  speech normal   PSYCHIATRIC: Mood, affect within normal limits.   ALL OTHER ROS ARE NEGATIVE         ASSESSMENT / PLAN:  65 year old pleasant white female seen today for follow-up for abnormal CT chest with diagnosis of severe COPD with a previous history of lung cancer  Squamous cell carcinoma status postresection with chemoradiation  With progressive worsening CT scan findings with a diagnosis of AFB positive MAC infection  Has been on multiple antibiotics over the last several years followed by infectious disease   CT scan shows destruction of the right lung with necrotizing pneumonia with lung abscess from infectious process with a percutaneous drainage At this time I explained to the patient that her right lung is completely damaged and no longer functional.  She is basically living with 1 lung which is the left lung I have advised that she avoid dust allergens sick contacts wear mask at all times as well as wash hands CT of the chest reviewed with patient in detail Plan to continue drainage 3 times a day via percutaneous chest tube of the right lung    Her COPD is end-stage Will try Breztri at this time She has not used her Advair in a while Continue to use albuterol as needed Will assess for nocturnal hypoxia with overnight pulse oximetry  Continue palliative care assessment      MEDICATION ADJUSTMENTS/LABS AND TESTS ORDERED: Start  Breztri Will also try Trelegy if needed Albuterol as needed Overnight pulse oximetry to assess  for nocturnal hypoxia Continue drainage of the right chest tube as needed  CURRENT MEDICATIONS REVIEWED AT LENGTH WITH PATIENT TODAY   Patient  satisfied with Plan of action and management. All questions answered  Follow up 6 months  Total Time Spent  42 mins   Corrin Parker, M.D.  [facility]  Medical Director [place] Director [facility] Cardio-Pulmonary Department

## 2021-05-04 NOTE — Patient Instructions (Addendum)
Try Breztri 2 puffs twice daily If that does not work lets try Trelegy 200 1 puff daily  Still Use albuterol as needed  Check ONO

## 2021-05-09 ENCOUNTER — Other Ambulatory Visit: Payer: Self-pay | Admitting: Internal Medicine

## 2021-05-10 ENCOUNTER — Other Ambulatory Visit: Payer: PPO

## 2021-05-10 ENCOUNTER — Other Ambulatory Visit: Payer: Self-pay

## 2021-05-10 VITALS — BP 102/64 | HR 97 | Temp 97.4°F | Wt 79.0 lb

## 2021-05-10 DIAGNOSIS — Z515 Encounter for palliative care: Secondary | ICD-10-CM

## 2021-05-10 NOTE — Progress Notes (Signed)
PATIENT NAME: Claudia Chavez DOB: 12-26-1955 MRN: [id number]  PRIMARY CARE PROVIDER: Maryland Pink, MD  RESPONSIBLE PARTY:  Acct ID - Guarantor Home Phone Work Phone Relationship Acct Type  [id number] WINSLOW, [phone]  Self P/F     [address], [place], [place] [address]    PLAN OF CARE and INTERVENTIONS:               1.  GOALS OF CARE/ ADVANCE CARE PLANNING:  Stay home for as long as possible.                2.  PATIENT/CAREGIVER EDUCATION:  Life Alert and Hospice               4. PERSONAL EMERGENCY PLAN:  Activate 911 for emergencies.                5.  DISEASE STATUS:  Joint visit completed with [place], SW and patient.   Patient alert/oriented and engaging in conversation.  She discussed her recent visit with Dr. Mortimer Fries.  She has tried Librarian, academic but did not feel there was any change in her breathing.  She will begin Treligy starting tomorrow and if this works better for her she will request a script from Dr. Zoila Shutter office.  Equipment will be delivered Monday to assess patient for nocturnal hypoxia.  Patient believes she needs oxygen but her oxygen readings are always in the 90's even with exertion.   She is hopeful upcoming testing will allow her to have oxygen in the home.  Patient states she should have results either next week or the week after.  Patient is aware her diagnosis is terminal and she is already aware that her right lung in deteriorating quickly. She has a chest tube present to her right lung and maintains draining the bag independently.  Her sister-in-law comes once a week to change her bag.  We discussed hospice and patient's thoughts about this program.  She is open to having hospice but does not feel it is necessary at this time.  She is still maintaining quite well in her home.  Patient reports taking her time to accomplish tasks.  Her sister-in-law is completing grocery shopping weekly for her and will accompany her to doctor's appointments when needed.   Patient continues to drive when needed.  She is not requiring any additional assistance to complete personal hygiene.  Advised patient that she could contact Palliative Care when she felt hospice was needed and we could facilitate this transition.  Discussed safety with patient and she is agreeable to a life alert system.  Palliative Care SW provided options to patient and she would like to proceed with [facility].  SW will assist with connecting patient with this organization so she can proceed with set up.  Christin Gusler, NP updated on today's visit.   HISTORY OF PRESENT ILLNESS:  65 year old female with COPD.  Patient is being followed every 4-8 weeks by Palliative Care.   CODE STATUS: DNR ADVANCED DIRECTIVES: Yes MOST FORM: No PPS: 50%   PHYSICAL EXAM:   VITALS: Today's Vitals   05/10/21 1259  BP: 102/64  Pulse: 97  Temp: (!) 97.4 F (36.3 C)  SpO2: 96%  Weight: 79 lb (35.8 kg)  PainSc: 0-No pain    LUNGS: positive findings: rales right lower posterior, rhonchi right mid posterior and right lower posterior CARDIAC: Cor RRR}  EXTREMITIES: - for edema SKIN: Skin color, texture, turgor normal. No rashes  or lesions or normal  NEURO: negative       Lorenza Burton, RN

## 2021-05-10 NOTE — Progress Notes (Signed)
[facility]ITY PALLIATIVE CARE SW NOTE  PATIENT NAME: Claudia Chavez DOB: [birth year] MRN: [id number]  PRIMARY CARE PROVIDER: Maryland Pink, MD  RESPONSIBLE PARTY:  Acct ID - Guarantor Home Phone Work Phone Relationship Acct Type  [id number] SHRUTI, [phone]  Self P/F     [address] [place], [place] [address]     PLAN OF CARE and INTERVENTIONS:              GOALS OF CARE/ ADVANCE CARE PLANNING:  Patient is a DNR. Patient has living will as well. Patients goal to remain in her home independently with support from SIL.   SW and RN met with patient for palliative care follow up visit. Patient resides in a one story home alone. SIL is supportive and visits often.   Patient shared that she visited pulmonology last week, visit went well. Patient to received equipment in the mail on Monday for sleep study to test O2 while sleeping. As of right now patient does not qualify for O2 at this time as her stats do not meet qualifications. Pulmonology started patient on new inhaler.   Patient shares that she sleeps well. Appetite is good, patient drinks supplement drinks. However, patients weight continues to fluctuate. Patient is able to make her own meals. She prefers oven and microwave meals. SIL goes grocery shopping for patient.  Patient was prescribed marinol, but did not do well on this.  No pain reported. No falls.    RN checked vitals and reviewed medications. Discussed starting trelogy tomorrow as well as being scheduled to have a study beginning Monday night to see if she has nocturnal hypoxia. Patient shares she feels she would benefit from O2 but her O2 stats does not justify insurance covering the equipment. Patient shared that she is able to do all of her ADL's and house hold chores on her own but has to take breaks due to getting fatigued.   SW discussed life alert systems and options discussed with patient. Patient shared that she is open to a life alert and would prefer one  that is wireless and water resistant. [place] during visit to share patient's interest in service.   Patient had no other concerns for PC. Discussed goals, reviewed care plan, provided emotional support, used active and reflective listening.  Patient A&O and engaged in discussion. Patient denies any symptoms of depression or anxiety. Patient is accepting of diagnosis and has a good outlook on her life and prognosis. Patients SIL is supportive.      SOCIAL HX:  Social History   Tobacco Use   Smoking status: Former    Packs/day: 0.50    Years: 40.00    Pack years: 20.00    Types: Cigarettes    Quit date: 12/01/2019    Years since quitting: 1.4   Smokeless tobacco: Never  Substance Use Topics   Alcohol use: Not Currently    Alcohol/week: 2.0 - 4.0 standard drinks    Types: 2 - 4 Cans of beer per week    Comment: beer-occasionally    CODE STATUS: DNR ADVANCED DIRECTIVES: Y. Living will MOST FORM COMPLETE:  N HOSPICE EDUCATION PROVIDED: Y. Discussed with patient. Patient is aware of service, but does not feel she is ready for hospice at this time.        Doreene Eland, [place]

## 2021-05-12 ENCOUNTER — Inpatient Hospital Stay: Payer: PPO | Attending: Hospice and Palliative Medicine | Admitting: Hospice and Palliative Medicine

## 2021-05-12 DIAGNOSIS — C3411 Malignant neoplasm of upper lobe, right bronchus or lung: Secondary | ICD-10-CM

## 2021-05-12 NOTE — Progress Notes (Signed)
Was unable to reach patient by phone. Will reschedule.

## 2021-05-16 ENCOUNTER — Encounter: Payer: Self-pay | Admitting: Nurse Practitioner

## 2021-05-16 ENCOUNTER — Other Ambulatory Visit: Payer: PPO | Admitting: Nurse Practitioner

## 2021-05-16 ENCOUNTER — Other Ambulatory Visit: Payer: Self-pay

## 2021-05-16 ENCOUNTER — Encounter: Payer: Self-pay | Admitting: Internal Medicine

## 2021-05-16 DIAGNOSIS — Z515 Encounter for palliative care: Secondary | ICD-10-CM | POA: Diagnosis not present

## 2021-05-16 DIAGNOSIS — R0602 Shortness of breath: Secondary | ICD-10-CM

## 2021-05-16 DIAGNOSIS — R0683 Snoring: Secondary | ICD-10-CM | POA: Diagnosis not present

## 2021-05-16 DIAGNOSIS — G4730 Sleep apnea, unspecified: Secondary | ICD-10-CM | POA: Diagnosis not present

## 2021-05-16 NOTE — Progress Notes (Signed)
[facility] Consult Note Telephone: [phone]  Fax: [phone]    Date of encounter: 05/16/21 PATIENT NAME: Claudia Chavez [place] [id number]   [phone] (home)  DOB: 1956-04-10 MRN: [id number] PRIMARY CARE PROVIDER:    Maryland Pink, MD,  [place] [address] [phone] RESPONSIBLE PARTY:   Self Due to the COVID-19 crisis, this visit was done via telemedicine from my office and it was initiated and consent by this patient and or family.  I connected with  Claudia Chavez on 05/16/21 by phone as video not available enabled telemedicine application and verified that I am speaking with the correct person.  Palliative Care was asked to follow this patient by consultation request of  Claudia Pink, MD to address advance care planning and complex medical decision making. This is a follow up visit.                                  ASSESSMENT AND PLAN / RECOMMENDATIONS:  Symptom Management/Plan: ACP: DNR in place, in vynca; discussed Hospice, [name] endorses she will continue to wait as she does not feel she is ready for Hospice, in agreement to ongoing discussion.  2. Shortness of breath secondary to progressive of malignancy vs poorly controlled COPD; continue nebulizer treatments; albuterol HFA, Pulmonology consult, would benefit from supplement O2 difficulty qualifying as she does not drop her sats with exertion, though symptomatic. Request to refill Rx: Albuterol HFA; 2 puffs q 4-6 hrs as needed for wheezing, shortness of breath. #1 3. Palliative care encounter; Palliative care encounter; Palliative medicine team will continue to support patient, patient's family, and medical team. Visit consisted of counseling and education dealing with the complex and emotionally intense issues of symptom management and palliative care in the setting of serious and potentially  life-threatening illness 4. f/u 1 month for ongoing monitoring chronic disease progression, ongoing discussions complex medical decision making  Follow up Palliative Care Visit: Palliative care will continue to follow for complex medical decision making, advance care planning, and clarification of goals. Return 4 weeks or prn.  I spent 25 minutes providing this consultation. More than 50% of the time in this consultation was spent in counseling and care coordination.  PPS: 50%  Chief Complaint: Follow up Palliative consult for complex medical decision making  HISTORY OF PRESENT ILLNESS:  Claudia Chavez is a 65 y.o. year old female  with multiple medical problems including severe COPD, history of non-small cell right lung cancer status postchemotherapy and right lower lobe resection and adjuvant XRT.  Chronic issues with cavitary infection requiring drainage and multiple previous courses of antibiotics.  Patient is followed by ID.  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  Patient is not interested in further antibiotics. [name] saw Dr Rogue [facility] Oncology on 03/09/2021 with current weight 81 lbs; plan as follows for Malignant neoplasm of right upper lobe of lung with progressive shortness of breath, duoneb nebulizer; continue with pulmonary; concern for progressive of malignancy vs poorly controlled COPD; right apical posterior cavity lesion/fluid collection improved with drain which continues to remain intact. Infectious disease bilateral MAC with persistent poor tolerance to  amikacin which was d/c. Right upper lobe cavitary lesion s/p drain aspergillus, stable; Bilateral DVT remains on eliquis. Anemia secondary to iron deficiency ?etiology with hgb stable,  proceeded with venofer. Chronic chest wall pain with chronic cough,continue tessalon pearls with tramadol for pain. I called [name] in agreement to change visit from in person to telemedicine telephonic as video no available. [name] in agreement. [name] and I talked about how she has been feeling today. [name] endorses she was having more shortness of breath this past this weekend. [name] endorses she is just now starting to feel a little better. We talked about shortness of breath, management. [name] asked albuterol HFA to be refilled, completed. We talked about O2 as [name] has upcoming further testing to see if requires O2, [name] endorses it would help her feeling better if she could  have O2. We talked about chronic disease progression. We talked about Ms Claudia Chavez's visit with Dr Claudia Chavez, Pulmonologist, discussed medications. [name] endorses she was thankful for samples that were given to try to see if any improvement. [name] endorses her Eliquis is expensive. [name] endorses she coughed up something that was large, grey in color, large amount. We talked about medical goals of care. We talked about option of Hospice services as previously discussed. [name] endorses she wants to continue to wait, she is not ready at this time for Hospice. [name] endorses she does understand she can contact PC is she changes her mind. We talked about role pc in poc. We talked about f/u pc visit, in agreement, scheduled .Therapeutic listening, emotional support provided. Questions answered.   History obtained from review of EMR, discussion with [name].  I reviewed available labs, medications, imaging, studies and related documents from the EMR.  Records reviewed and summarized above.   ROS Full 14 system review of systems performed and negative with exception of: as per HPI.   Physical Exam: deferred Questions and concerns were addressed. The patient was encouraged to  call with questions and/or concerns. Provided general support and encouragement, no other unmet needs identified   Thank you for the opportunity to participate in the care of [name].  The palliative care team will continue to follow. Please call our office at [phone] if we can be of additional assistance.   This chart was dictated using voice recognition software.  Despite best efforts to proofread,  errors can occur which can change the documentation meaning.   Myishia Kasik Ihor Gully, NP

## 2021-05-24 ENCOUNTER — Telehealth: Payer: Self-pay

## 2021-05-24 DIAGNOSIS — J449 Chronic obstructive pulmonary disease, unspecified: Secondary | ICD-10-CM

## 2021-05-24 MED ORDER — BREZTRI AEROSPHERE 160-9-4.8 MCG/ACT IN AERO: 160.0000 ug | INHALATION_SPRAY | Freq: Two times a day (BID) | RESPIRATORY_TRACT | 11 refills | Status: AC

## 2021-05-24 NOTE — Telephone Encounter (Signed)
Patient is aware of results and voiced her understanding.  Order placed to adapt, as patient is agreeable.  Breztri sent to preferred pharmacy per request.  Nothing further needed at this time.

## 2021-05-24 NOTE — Telephone Encounter (Signed)
ATC patient in regards to ONO results. Dr Mortimer Fries after reviewing ONO results, pt qualifies for 1L [place] at night .  Will attempt to try again later.

## 2021-05-24 NOTE — Telephone Encounter (Signed)
Calling back for ONO results.  Also states she would like breztri called into pharm: [facility] on [address]

## 2021-06-10 ENCOUNTER — Inpatient Hospital Stay: Payer: PPO | Attending: Internal Medicine

## 2021-06-10 ENCOUNTER — Other Ambulatory Visit: Payer: Self-pay | Admitting: *Deleted

## 2021-06-10 ENCOUNTER — Inpatient Hospital Stay: Payer: PPO

## 2021-06-10 ENCOUNTER — Inpatient Hospital Stay (HOSPITAL_BASED_OUTPATIENT_CLINIC_OR_DEPARTMENT_OTHER): Payer: PPO | Admitting: Hospice and Palliative Medicine

## 2021-06-10 ENCOUNTER — Other Ambulatory Visit: Payer: Self-pay | Admitting: Internal Medicine

## 2021-06-10 ENCOUNTER — Other Ambulatory Visit: Payer: Self-pay

## 2021-06-10 VITALS — BP 113/87 | HR 101 | Temp 98.4°F | Resp 18 | Wt 81.0 lb

## 2021-06-10 DIAGNOSIS — E611 Iron deficiency: Secondary | ICD-10-CM

## 2021-06-10 DIAGNOSIS — D509 Iron deficiency anemia, unspecified: Secondary | ICD-10-CM | POA: Diagnosis not present

## 2021-06-10 DIAGNOSIS — Z95828 Presence of other vascular implants and grafts: Secondary | ICD-10-CM

## 2021-06-10 DIAGNOSIS — E876 Hypokalemia: Secondary | ICD-10-CM

## 2021-06-10 DIAGNOSIS — C3411 Malignant neoplasm of upper lobe, right bronchus or lung: Secondary | ICD-10-CM | POA: Diagnosis not present

## 2021-06-10 LAB — CBC WITH DIFFERENTIAL/PLATELET
Abs Immature Granulocytes: 0.02 10*3/uL (ref 0.00–0.07)
Basophils Absolute: 0.1 10*3/uL (ref 0.0–0.1)
Basophils Relative: 1 %
Eosinophils Absolute: 0.1 10*3/uL (ref 0.0–0.5)
Eosinophils Relative: 2 %
HCT: 35.2 % — ABNORMAL LOW (ref 36.0–46.0)
Hemoglobin: 11.5 g/dL — ABNORMAL LOW (ref 12.0–15.0)
Immature Granulocytes: 0 %
Lymphocytes Relative: 19 %
Lymphs Abs: 1.6 10*3/uL (ref 0.7–4.0)
MCH: 32.8 pg (ref 26.0–34.0)
MCHC: 32.7 g/dL (ref 30.0–36.0)
MCV: 100.3 fL — ABNORMAL HIGH (ref 80.0–100.0)
Monocytes Absolute: 0.7 10*3/uL (ref 0.1–1.0)
Monocytes Relative: 8 %
Neutro Abs: 6.2 10*3/uL (ref 1.7–7.7)
Neutrophils Relative %: 70 %
Platelets: 435 10*3/uL — ABNORMAL HIGH (ref 150–400)
RBC: 3.51 MIL/uL — ABNORMAL LOW (ref 3.87–5.11)
RDW: 14.5 % (ref 11.5–15.5)
Smear Review: NORMAL
WBC: 8.7 10*3/uL (ref 4.0–10.5)
nRBC: 0 % (ref 0.0–0.2)

## 2021-06-10 LAB — C-REACTIVE PROTEIN: CRP: 4.3 mg/dL — ABNORMAL HIGH (ref <1.0)

## 2021-06-10 LAB — IRON AND TIBC
Iron: 42 ug/dL (ref 28–170)
Saturation Ratios: 17 % (ref 10.4–31.8)
TIBC: 252 ug/dL (ref 250–450)
UIBC: 210 ug/dL

## 2021-06-10 LAB — BASIC METABOLIC PANEL
Anion gap: 7 (ref 5–15)
BUN: 18 mg/dL (ref 8–23)
CO2: 27 mmol/L (ref 22–32)
Calcium: 8.8 mg/dL — ABNORMAL LOW (ref 8.9–10.3)
Chloride: 99 mmol/L (ref 98–111)
Creatinine, Ser: 0.37 mg/dL — ABNORMAL LOW (ref 0.44–1.00)
GFR, Estimated: >60 mL/min (ref >60)
Glucose, Bld: 89 mg/dL (ref 70–99)
Potassium: 4.2 mmol/L (ref 3.5–5.1)
Sodium: 133 mmol/L — ABNORMAL LOW (ref 135–145)

## 2021-06-10 LAB — FERRITIN: Ferritin: 192 ng/mL (ref 11–307)

## 2021-06-10 MED ORDER — SODIUM CHLORIDE 0.9% FLUSH
10.0000 mL | Freq: Once | INTRAVENOUS | Status: AC
  Administered 2021-06-10: 10 mL via INTRAVENOUS
  Filled 2021-06-10: qty 10

## 2021-06-10 MED ORDER — SODIUM CHLORIDE 0.9 % IV SOLN
Freq: Once | INTRAVENOUS | Status: AC
  Filled 2021-06-10: qty 250

## 2021-06-10 MED ORDER — SODIUM CHLORIDE 0.9 % IV SOLN: 200.0000 mg | Freq: Once | INTRAVENOUS | Status: DC

## 2021-06-10 MED ORDER — HEPARIN SOD (PORK) LOCK FLUSH 100 UNIT/ML IV SOLN
INTRAVENOUS | Status: AC
  Administered 2021-06-10: 500 [IU]
  Filled 2021-06-10: qty 5

## 2021-06-10 MED ORDER — IRON SUCROSE 20 MG/ML IV SOLN
200.0000 mg | Freq: Once | INTRAVENOUS | Status: AC
  Administered 2021-06-10: 200 mg via INTRAVENOUS
  Filled 2021-06-10: qty 10

## 2021-06-10 MED ORDER — TRAMADOL HCL 50 MG PO TABS: 50.0000 mg | ORAL_TABLET | Freq: Two times a day (BID) | ORAL | 0 refills | Status: DC | PRN

## 2021-06-10 MED ORDER — HEPARIN SOD (PORK) LOCK FLUSH 100 UNIT/ML IV SOLN
500.0000 [IU] | Freq: Once | INTRAVENOUS | Status: AC
  Administered 2021-06-10: 500 [IU] via INTRAVENOUS
  Filled 2021-06-10: qty 5

## 2021-06-10 NOTE — Progress Notes (Signed)
Claudia Chavez OFFICE PROGRESS NOTE  Patient Care Team: [place] Pink, MD as PCP - General (Family Medicine) Cammie Sickle, MD as Medical Oncologist (Medical Oncology) Rockey Situ, Kathlene November, MD as Consulting Physician (Cardiology) Efrain Sella, MD as Consulting Physician (Gastroenterology) Noreene Filbert, MD as Referring Physician (Radiation Oncology)  Cancer Staging Malignant neoplasm of lung [facility]) Staging form: Lung, AJCC 7th Edition - Clinical: Stage IIIA (T3, N2, M0) - Signed by Forest Gleason, MD on 05/07/2015 Laterality: Right    Oncology History Overview Note  # Squamous cell carcinoma of right  LOWER LOBE OF  lung stage IIIA based on PET scan Biopsy of the (August of 2016). 2.  After 3 cycles of chemotherapy patient underwent resection of lung mass (November, 2016) [id number]   ypN0 [Dr.Oaks]  status post right lower lobectomy with a 3. She  was started on carboplatinum and Taxol on a weekly basis and radiation therapy from January of 2017 4. After 2 cycles of carboplatinum patient had neuropathy grade 2 interfering with work so chemotherapy was put on hold and radiation was continued (January 19th, 2017) 5.  Chemotherapy was discontinued because of progressive neuropathy.  Patient is finishing of radiation therapy on November 19, 2015  # NOV 2017- CT- 24m right hilar LN; PET- Feb 2018- NED.   # DEC-JAN 2019- RUL Lung nodule [s/p Bronch; Dr.Kasa- non-diagnostic] SBRT Feb 2019  #October 10, 2018 bronchoscopy [Dr.Kasa]-negative for malignancy/positive for MAC infection-ethambutol [Dr.Ravishankar]; [facility]- R LE DVT [xarelto]x STOP in end of dec 2019. Syncope- MRI- 13 mm cystic leision/asymptomatic/[facility]; Bil subacute cerebellar stroke;  AUG 2019 - GIB/anemia [EGD/colo- Dr.Toledo; NEG]; Aug 2019-  Severe hypokalemia- sec to Florinef-resolved.   DIAGNOSIS: RLL stage III lung ca; RUL stage I   GOALS:  curative  CURRENT/MOST RECENT THERAPY : Surveillaince    Malignant neoplasm of lung (HCC)  Malignant neoplasm of right upper lobe of lung (HCC)      INTERVAL HISTORY:  DMARYFER [id number].o.  female pleasant patient above history of stage III lung cancer; and also stage I right upper lobe status post radiation February 2019; history of MAC infection-poor tolerance to therapy; and also right lung abscess status post drain; question Aspergillus infection on voriconazole* and anemia secondary to chronic disease/iron deficiency is here for follow-up.  Since last seen, patient reports no significant changes.  She endorses chronic shortness of breath but saw Dr. KMortimer Friesrecently with pulmonary and was started on a new inhaler.  She reports that this has helped her shortness of breath.  She has chronic fatigue and right flank pain but this is unchanged in severity or characteristic.  No GI or GU symptoms.  No fever or chills.  Patient any blood in stools or black or stools.  No abdominal pain.  No headaches.  Patient states her appetite is good.  However she is not gaining weight.  She is currently on Remeron.  Review of Systems  Constitutional:  Positive for malaise/fatigue. Negative for chills, diaphoresis and fever.  HENT:  Negative for nosebleeds and sore throat.   Eyes:  Negative for double vision.  Respiratory:  Positive for cough, sputum production and shortness of breath. Negative for hemoptysis and wheezing.   Cardiovascular:  Negative for chest pain, palpitations, orthopnea and leg swelling.  Gastrointestinal:  Negative for abdominal pain, blood in stool, constipation, diarrhea, heartburn, melena, nausea and vomiting.  Genitourinary:  Negative for dysuria, frequency and urgency.  Musculoskeletal:  Positive for back pain and joint pain.  Skin: Negative.  Negative for itching and rash.  Neurological:  Negative for tingling, focal weakness, weakness and headaches.  Endo/Heme/Allergies:   Does not bruise/bleed easily.  Psychiatric/Behavioral:  Negative for depression. The patient is not nervous/anxious and does not have insomnia.      PAST MEDICAL HISTORY :  Past Medical History:  Diagnosis Date   Cancer of lower lobe of right lung ([place]) 05/07/2015   COPD (chronic obstructive pulmonary disease) (HCC)    Dyspnea    Non-small cell lung cancer ([place])    Pneumonia 04/2015   Pulmonary Mycobacterium avium complex (MAC) infection ([place]) 10/25/2018    PAST SURGICAL HISTORY :   Past Surgical History:  Procedure Laterality Date   ABDOMINAL HYSTERECTOMY     COLONOSCOPY WITH PROPOFOL N/A 06/09/2018   Procedure: COLONOSCOPY WITH PROPOFOL;  Surgeon: Toledo, Benay Pike, MD;  Location: [facility] ENDOSCOPY;  Service: Gastroenterology;  Laterality: N/A;   ELBOW SURGERY Right 1995   ELECTROMAGNETIC NAVIGATION BROCHOSCOPY N/A 10/25/2017   Procedure: ELECTROMAGNETIC NAVIGATION BRONCHOSCOPY;  Surgeon: [name], MD;  Location: [facility] ORS;  Service: Cardiopulmonary;  Laterality: N/A;   ESOPHAGOGASTRODUODENOSCOPY N/A 06/08/2018   Procedure: ESOPHAGOGASTRODUODENOSCOPY (EGD);  Surgeon: Toledo, Benay Pike, MD;  Location: [facility] ENDOSCOPY;  Service: Gastroenterology;  Laterality: N/A;   FLEXIBLE BRONCHOSCOPY Right 10/10/2018   Procedure: FLEXIBLE BRONCHOSCOPY;  Surgeon: [name], MD;  Location: [facility] ORS;  Service: Cardiopulmonary;  Laterality: Right;   PORTACATH PLACEMENT Right 05/10/2015   Procedure: INSERTION PORT-A-CATH;  Surgeon: Nestor Lewandowsky, MD;  Location: [facility] ORS;  Service: General;  Laterality: Right;   VIDEO ASSISTED THORACOSCOPY (VATS)/THOROCOTOMY Right 08/18/2015   Procedure: VIDEO ASSISTED THORACOSCOPY (VATS)/THOROCOTOMY;  Surgeon: Nestor Lewandowsky, MD;  Location: [facility] ORS;  Service: General;  Laterality: Right;    FAMILY HISTORY :   Family History  Problem Relation Age of Onset   Stroke Mother    Lung cancer Father 81    SOCIAL HISTORY:   Social History   Tobacco Use   Smoking status: Former     Packs/day: 0.50    Years: 40.00    Pack years: 20.00    Types: Cigarettes    Quit date: 12/01/2019    Years since quitting: 1.5   Smokeless tobacco: Never  Vaping Use   Vaping Use: Never used  Substance Use Topics   Alcohol use: Not Currently    Alcohol/week: 2.0 - 4.0 standard drinks    Types: 2 - 4 Cans of beer per week    Comment: beer-occasionally   Drug use: No    ALLERGIES:  is allergic to keppra [levetiracetam], marinol [dronabinol], and lyrica [pregabalin].  MEDICATIONS:  Current Outpatient Medications  Medication Sig Dispense Refill   albuterol (PROVENTIL HFA;VENTOLIN HFA) 108 (90 Base) MCG/ACT inhaler Inhale 2 puffs into the lungs every 6 (six) hours as needed for wheezing or shortness of breath.      apixaban (ELIQUIS) 2.5 MG TABS tablet TAKE 1 TABLET(2.5 MG) BY MOUTH TWICE DAILY 60 tablet 2   benzonatate (TESSALON) 100 MG capsule TAKE 1 CAPSULE(100 MG) BY MOUTH THREE TIMES DAILY AS NEEDED FOR COUGH 90 capsule 3   Budeson-Glycopyrrol-Formoterol (BREZTRI AEROSPHERE) 160-9-4.8 MCG/ACT AERO Inhale 160 mcg into the lungs 2 (two) times daily. 5.9 g 11   Cholecalciferol (VITAMIN D3) 1000 units CAPS Take 2,000 Units by mouth daily.      ipratropium-albuterol (DUONEB) 0.5-2.5 (3) MG/3ML SOLN Take 3 mLs by nebulization every 4 (four) hours as needed.  360 mL 3   mirtazapine (REMERON) 30 MG tablet Take 1 tablet (30 mg total) by mouth at bedtime. 30 tablet 3   ondansetron (ZOFRAN) 4 MG tablet Take 1 tablet (4 mg total) by mouth every 8 (eight) hours as needed for nausea or vomiting. 45 tablet 3   Respiratory Therapy Supplies (FLUTTER) DEVI 1 each by Does not apply route daily. 1 each 0   traMADol (ULTRAM) 50 MG tablet Take 1 tablet (50 mg total) by mouth every 12 (twelve) hours as needed for moderate pain or severe pain. 60 tablet 0   vitamin B-12 (CYANOCOBALAMIN) 1000 MCG tablet Take 1,000 mcg by mouth daily.     No current facility-administered medications for this visit.    Facility-Administered Medications Ordered in Other Visits  Medication Dose Route Frequency Provider Last Rate Last Admin   heparin lock flush 100 UNIT/ML injection            sodium chloride flush (NS) 0.9 % injection 10 mL  10 mL Intravenous PRN Cammie Sickle, MD        PHYSICAL EXAMINATION: ECOG PERFORMANCE STATUS: 1 - Symptomatic but completely ambulatory  BP 113/87   Pulse (!) 101   Temp 98.4 F (36.9 C) (Tympanic)   Resp 18   Wt 81 lb (36.7 kg)   SpO2 99%   BMI 15.30 kg/m   Filed Weights   06/10/21 1319  Weight: 81 lb (36.7 kg)    Physical Exam Constitutional:      Comments: Cachectic appearing thin built Caucasian female patient.  She is alone.  She is ambulating independently.  HENT:     Head: Normocephalic and atraumatic.     Mouth/Throat:     Pharynx: No oropharyngeal exudate.  Eyes:     Pupils: Pupils are equal, round, and reactive to light.  Cardiovascular:     Rate and Rhythm: Normal rate and regular rhythm.  Pulmonary:     Effort: No respiratory distress.     Breath sounds: No wheezing.  Abdominal:     General: Bowel sounds are normal. There is no distension.     Palpations: Abdomen is soft. There is no mass.     Tenderness: There is no abdominal tenderness. There is no guarding or rebound.  Musculoskeletal:        General: No tenderness. Normal range of motion.     Cervical back: Normal range of motion and neck supple.  Skin:    General: Skin is warm.  Neurological:     General: No focal deficit present.     Mental Status: She is alert and oriented to person, place, and time.  Psychiatric:        Mood and Affect: Affect normal.   LABORATORY DATA:  I have reviewed the data as listed    Component Value Date/Time   NA 133 (L) 06/10/2021 1247   NA 137 07/30/2018 1245   K 4.2 06/10/2021 1247   CL 99 06/10/2021 1247   CO2 27 06/10/2021 1247   GLUCOSE 89 06/10/2021 1247   BUN 18 06/10/2021 1247   BUN 11 07/30/2018 1245   CREATININE  0.37 (L) 06/10/2021 1247   CALCIUM 8.8 (L) 06/10/2021 1247   PROT 8.2 (H) 07/29/2020 0839   ALBUMIN 2.4 (L) 07/29/2020 0839   AST 17 07/29/2020 0839   ALT 14 07/29/2020 0839   ALKPHOS 123 07/29/2020 0839   BILITOT 0.3 07/29/2020 0839   GFRNONAA >60 06/10/2021 1247   GFRAA >60 06/09/2020 1025  No results found for: SPEP, UPEP  Lab Results  Component Value Date   WBC 8.7 06/10/2021   NEUTROABS 6.2 06/10/2021   HGB 11.5 (L) 06/10/2021   HCT 35.2 (L) 06/10/2021   MCV 100.3 (H) 06/10/2021   PLT 435 (H) 06/10/2021      Chemistry      Component Value Date/Time   NA 133 (L) 06/10/2021 1247   NA 137 07/30/2018 1245   K 4.2 06/10/2021 1247   CL 99 06/10/2021 1247   CO2 27 06/10/2021 1247   BUN 18 06/10/2021 1247   BUN 11 07/30/2018 1245   CREATININE 0.37 (L) 06/10/2021 1247      Component Value Date/Time   CALCIUM 8.8 (L) 06/10/2021 1247   ALKPHOS 123 07/29/2020 0839   AST 17 07/29/2020 0839   ALT 14 07/29/2020 0839   BILITOT 0.3 07/29/2020 0839       RADIOGRAPHIC STUDIES: I have personally reviewed the radiological images as listed and agreed with the findings in the report.  Last CT of the chest on 03/21/2021 revealed increasingly thick walled cavitary collection of air and right hemothorax, right basilar collapse/consolidation and bronchiectasis, and scattered mucoid impaction bilaterally, and large mediastinal/extrapleural lymph nodes.  ASSESSMENT & PLAN:  # Right upper lobe stage I lung cancer; s/p feb February 2019 SBRT; Left lung stage III lung cancer- JAN 24th, 2022-collapse/consolidative opacity right base progressive; progression of right hilar/mediastinal lymphadenopathy-reactive versus metastatic disease.  Right apical posterior-cavitary lesion/fluid collection improved-s/p drain   Discussed with Dr. Rogue Bussing and will order repeat CT of chest with contrast in 4 months   #Shortness of breath: Patient reports this is improved on inhalers.  She is followed by  pulmonary.  Continue monitor.  #Bilateral lower extremity DVT[April 03, 2020]-currently on Eliquis 2.5 mg twice daily.  Stable   #Infectious disease: Bilateral MAC/persistent-poor tolerance to amikacin; discontinued.  Right upper lobe cavitary lesion s/p drain-Aspergillus.  Clinically stable.   #Anemia secondary to iron deficiency ? Etiology-hemoglobin today is 11.5.  Patient significantly improved post IV iron infusions.  Discussed with Dr. Rogue Bussing will proceed with Venofer today  # COVID vaccination- delines.    #Chronic chest wall pain/chronic cough ; refill tramadol.  PDMP reviewed.  Tessalon Perles.   #Antidepressant-continue dose of Remeron to 30 mg nightly; also should help with her appetite/weight gain [patient approximately 81 pounds] stable.   # DISPOSITION: # Venofer today #Repeat labs/CT in 4 months with follow-up by Dr. Rogue Bussing and possible Venofer   All questions were answered. The patient knows to call the clinic with any problems, questions or concerns.      Irean Hong, NP 06/10/2021 1:48 PM

## 2021-06-10 NOTE — Progress Notes (Signed)
Pt here for venofer treatment today. Appears short of breath. States that she feels tired and fatigues easily. Also reports right flank pain.

## 2021-06-10 NOTE — Patient Instructions (Signed)

## 2021-06-21 ENCOUNTER — Other Ambulatory Visit: Payer: PPO | Admitting: Nurse Practitioner

## 2021-06-21 ENCOUNTER — Encounter: Payer: Self-pay | Admitting: Nurse Practitioner

## 2021-06-21 ENCOUNTER — Other Ambulatory Visit: Payer: Self-pay

## 2021-06-21 DIAGNOSIS — R0602 Shortness of breath: Secondary | ICD-10-CM

## 2021-06-21 DIAGNOSIS — Z515 Encounter for palliative care: Secondary | ICD-10-CM

## 2021-06-21 NOTE — Progress Notes (Signed)
[facility] Consult Note Telephone: [phone]  Fax: [phone]    Date of encounter: 06/21/21 4:45 PM PATIENT NAME: Claudia Chavez [place] [address]   [phone] (home)  DOB: 02-07-1956 MRN: [id number] PRIMARY CARE PROVIDER:    Maryland Pink, MD,  [place] [address] [phone]  RESPONSIBLE PARTY:    Contact Information     Name Relation Home Work Mobile   [place] Sister [phone]        Due to the COVID-19 crisis, this visit was done via telemedicine from my office and it was initiated and consent by this patient and or family.  I connected with  Georgianne Fick OR PROXY on 06/21/21 by a video enabled telemedicine application and verified that I am speaking with the correct person .   I discussed the limitations of evaluation and management by telemedicine. The patient expressed understanding and agreed to proceed. This is a follow up visit.                                  ASSESSMENT AND PLAN /Recommendation Symptom Management/Plan: ACP: DNR in place, in vynca; discussed Hospice, Ms. Audia endorses she will continue to wait as she does not feel she is ready for Hospice, in agreement to ongoing discussion.  2. Shortness of breath secondary to progressive of malignancy vs poorly controlled COPD; continue nebulizer treatments; albuterol HFA, on O2 supplemental; Budeson-Glycopyrrol-Formoterol added by Dr Mortimer Fries, Pulmonology. Discussed at length about adding Roxanol for air hunger, smothering, or severe respiratory distress as described when she wakes up the last 2 nights. Ms. Carlton in agreement to trying Roxanol. Discussed will f/u with Billey Chang NP/Dr [facility] with further discussion of Roxanol. Josh in agreement will write rx. Discussed at length about hospice services, revisiting. Ms. Nachreiner endorses she will continue to think about hospice  though at this time remains not ready. She did receive Iron sucrose injection 06/10/2021.   3. Palliative care encounter; Palliative care encounter; Palliative medicine team will continue to support patient, patient's family, and medical team. Visit consisted of counseling and education dealing with the complex and emotionally intense issues of symptom management and palliative care in the setting of serious and potentially life-threatening illness 4. f/u 2 weeks for ongoing monitoring chronic disease progression, ongoing discussions complex medical decision making  Follow up Palliative Care Visit: Palliative care will continue to follow for complex medical decision making, advance care planning, and clarification of goals. Return 2 weeks or prn.  I spent 41 minutes providing this consultation. More than 50% of the time in this consultation was spent in counseling and care coordination.  PPS: 50%  Chief Complaint: Follow up palliative consult for complex medical decision making  HISTORY OF PRESENT ILLNESS:  Claudia Chavez is a 65 y.o. year old female  with multiple medical problems including severe COPD, history of non-small cell right lung cancer status postchemotherapy and right lower lobe resection and adjuvant XRT.  Chronic issues with cavitary infection requiring drainage and multiple previous courses of antibiotics.  Patient is followed by ID.  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  Patient is not  interested in further antibiotics. I called Ms. Tobin Chad for telemedicine telephonic as video not available. Ms. Stilley in agreement. We talked about how she has been feeling. Ms. Yepez endorses she has not been doing well. She has been having more shortness  of breath in the middle of the night, last 2 nights. We talked about her routine, symptoms, symptom management. We talked about Roxanol, see discussion above in plan. We talked about appetite, nutrition. We talked about medical goals of care. We talked about option of Hospice services, what would be provided. Ms. Drennen endorses she would like to continue to think about hospice. We talked about disease progression with realistic expectations. We talked about Ms. Salberg living by herself. We talked about role pc in poc. We talked about f/u pc visit in 2 weeks to see how she does with roxanol, Ms. Barno in agreement, scheduled. Therapeutic listening, emotional support provided. Questions answered.   History obtained from review of EMR, discussion with Ms. [place].  I reviewed available labs, medications, imaging, studies and related documents from the EMR.  Records reviewed and summarized above.   ROS Full 14 system review of systems performed and negative with exception of: as per HPI.   Physical Exam: deferred Questions and concerns were addressed. The patient was encouraged to call with questions and/or concerns. My contact information was provided. Provided general support and encouragement, no other unmet needs identified   Thank you for the opportunity to participate in the care of Claudia Chavez.  The palliative care team will continue to follow. Please call our office at [phone] if we can be of additional assistance.  This chart was dictated using voice recognition software.  Despite best efforts to proofread,  errors can occur which can change the documentation meaning.  Sumayya Muha Ihor Gully, NP

## 2021-06-22 ENCOUNTER — Other Ambulatory Visit: Payer: Self-pay | Admitting: Hospice and Palliative Medicine

## 2021-06-22 MED ORDER — MORPHINE SULFATE (CONCENTRATE) 10 MG /0.5 ML PO SOLN: 5.0000 mg | ORAL | 0 refills | Status: DC | PRN

## 2021-06-22 NOTE — Progress Notes (Signed)
I spoke with Christin Gusler, NP, who saw patient in the home yesterday.  Patient is having episodes of pain/dyspnea and recommendation was for trial of morphine elixir.  Will send Rx. Patient not yet agreeable to hospice.

## 2021-06-23 DIAGNOSIS — A310 Pulmonary mycobacterial infection: Secondary | ICD-10-CM | POA: Diagnosis not present

## 2021-06-23 DIAGNOSIS — J449 Chronic obstructive pulmonary disease, unspecified: Secondary | ICD-10-CM | POA: Diagnosis not present

## 2021-06-29 ENCOUNTER — Other Ambulatory Visit: Payer: Self-pay

## 2021-06-29 ENCOUNTER — Other Ambulatory Visit: Payer: PPO | Admitting: Nurse Practitioner

## 2021-06-29 ENCOUNTER — Encounter: Payer: Self-pay | Admitting: Nurse Practitioner

## 2021-06-29 DIAGNOSIS — Z515 Encounter for palliative care: Secondary | ICD-10-CM | POA: Diagnosis not present

## 2021-06-29 DIAGNOSIS — R0602 Shortness of breath: Secondary | ICD-10-CM | POA: Diagnosis not present

## 2021-06-29 NOTE — Progress Notes (Signed)
[facility] Consult Note Telephone: [phone]  Fax: [phone]    Date of encounter: 06/29/21 3:28 PM PATIENT NAME: Claudia Chavez [address] [place] [address]   [phone] (home)  DOB: Apr 19, 1956 MRN: [id number] PRIMARY CARE PROVIDER:    Maryland Pink, MD,  [place] [address] [phone]  RESPONSIBLE PARTY:    Contact Information     Name Relation Home Work Mobile   [place] Sister [phone]        Due to the COVID-19 crisis, this visit was done via telemedicine from my office and it was initiated and consent by this patient and or family.  I connected with  Claudia Chavez OR PROXY on 06/29/21 by a video enabled telemedicine application and verified that I am speaking with the correct person.   I discussed the limitations of evaluation and management by telemedicine. The patient expressed understanding and agreed to proceed. This is a follow up visit.                                   ASSESSMENT AND PLAN / RECOMMENDATIONS:  Symptom Management/Plan: ACP: DNR in place, in vynca; discussed Hospice, Claudia Chavez endorses she will continue to wait as she does not feel she is ready for Hospice, in agreement to ongoing discussion.  2. Shortness of breath secondary to progressive of malignancy vs poorly controlled COPD; continue nebulizer treatments; albuterol HFA, on O2 supplemental; Budeson-Glycopyrrol-Formoterol added by Dr Mortimer Fries, Pulmonology. Discussed at length Claudia Chavez did start  Roxanol for air hunger, smothering, or severe respiratory distress. Claudia Chavez endorses she has been using the Roxanol prior to bed with shortness of breath. She has not had any further episodes of air hunger, anxiety. The Roxanol has helped a lot. We talked about symptoms in the setting of disease progression. Medical goals reviewed. Claudia Chavez endorses she feels like she has been doing better.  Claudia Chavez endorses she has been sleeping.    3. Palliative care encounter; Palliative care encounter; Palliative medicine team will continue to support patient, patient's family, and medical team. Visit consisted of counseling and education dealing with the complex and emotionally intense issues of symptom management and palliative care in the setting of serious and potentially life-threatening illness  4. f/u 4 weeks for ongoing monitoring chronic disease progression, ongoing discussions complex medical decision making  Follow up Palliative Care Visit: Palliative care will continue to follow for complex medical decision making, advance care planning, and clarification of goals. Return 4 weeks or prn. I spent 26 minutes providing this consultation. More than 50% of the time in this consultation was spent in counseling and care coordination.  PPS: 60%  Chief Complaint: Follow up Palliative consult for complex medical decision making  HISTORY OF PRESENT ILLNESS:  LINDY GARCZYNSKI is a 65 y.o. year old female  with multiple medical problems including severe COPD, history of non-small cell right lung cancer status postchemotherapy and right lower lobe resection and adjuvant XRT.  Chronic issues with cavitary infection requiring drainage and multiple previous courses of antibiotics.  Patient is followed by ID.  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  Patient  is not interested in further antibiotics. I called Claudia Chavez for telemedicine telephonic as video not available. Claudia Chavez in agreement. We talked about how she has been feeling. Claudia Chavez endorses she has been doing well.  Discussed at length Claudia Chavez did start  Roxanol for air hunger, smothering, or  severe respiratory distress. Claudia Chavez endorses she has been using the Roxanol prior to bed with shortness of breath. She has not had any further episodes of air hunger, anxiety. The Roxanol has helped a lot. We talked about symptoms in the setting of disease progression. Medical goals reviewed. Claudia Chavez endorses she feels like she has been doing better. Claudia Chavez endorses she has been sleeping. We talked about purpose of pc visit, will continue with scheduled f/u. Therapeutic listening, emotional support provided. Questions answered.   History obtained from review of EMR, discussion with  Claudia Chavez.  I reviewed available labs, medications, imaging, studies and related documents from the EMR.  Records reviewed and summarized above.   ROS Full 14 system review of systems performed and negative with exception of: as per HPI.   Physical Exam: deferred Questions and concerns were addressed. The patient was encouraged to call with questions and/or concerns. My contact information was provided. Provided general support and encouragement, no other unmet needs identified   Thank you for the opportunity to participate in the care of Claudia Chavez.  The palliative care team will continue to follow. Please call our office at [phone] if we can be of additional assistance.   Gustav Knueppel Ihor Gully, NP

## 2021-07-08 ENCOUNTER — Encounter: Payer: Self-pay | Admitting: Nurse Practitioner

## 2021-07-08 ENCOUNTER — Other Ambulatory Visit: Payer: PPO | Admitting: Nurse Practitioner

## 2021-07-08 ENCOUNTER — Other Ambulatory Visit: Payer: Self-pay

## 2021-07-08 ENCOUNTER — Other Ambulatory Visit: Payer: Self-pay | Admitting: Hospice and Palliative Medicine

## 2021-07-08 DIAGNOSIS — R0602 Shortness of breath: Secondary | ICD-10-CM

## 2021-07-08 DIAGNOSIS — M545 Low back pain, unspecified: Secondary | ICD-10-CM

## 2021-07-08 DIAGNOSIS — C3411 Malignant neoplasm of upper lobe, right bronchus or lung: Secondary | ICD-10-CM

## 2021-07-08 DIAGNOSIS — M5440 Lumbago with sciatica, unspecified side: Secondary | ICD-10-CM | POA: Diagnosis not present

## 2021-07-08 DIAGNOSIS — Z515 Encounter for palliative care: Secondary | ICD-10-CM

## 2021-07-08 NOTE — Progress Notes (Signed)
[facility] Consult Note Telephone: [phone]  Fax: [phone]    Date of encounter: 07/08/21 2:43 PM PATIENT NAME: Claudia Chavez [address] [place] [address]   [phone] (home)  DOB: Aug 13, 1956 MRN: [id number] PRIMARY CARE PROVIDER:    Maryland Pink, MD,  [place] [address] [phone]  RESPONSIBLE PARTY:    Contact Information     Name Relation Home Work Mobile   [place] Sister [phone]        Due to the COVID-19 crisis, this visit was done via telemedicine from my office and it was initiated and consent by this patient and or family.  I connected with  Claudia Chavez OR PROXY on 07/08/21 by a video enabled telemedicine application and verified that I am speaking with the correct person.   I discussed the limitations of evaluation and management by telemedicine. The patient expressed understanding and agreed to proceed. This is a follow up visit.                                  ASSESSMENT AND PLAN / RECOMMENDATIONS: Symptom Management/Plan: ACP: DNR in place, in vynca; discussed Hospice, Claudia Chavez endorses she will continue to wait as she does not feel she is ready for Hospice, in agreement to ongoing discussion.   2. Shortness of breath secondary to progressive of malignancy vs poorly controlled COPD; continue nebulizer treatments; albuterol HFA, on O2 supplemental; Budeson-Glycopyrrol-Formoterol. Discussed at length, Claudia Chavez in agreement to Hospice, requested Hospice order, notified Hospice.   3. Lumbago; Claudia Chavez endorses 2 days ago in grocery store, no fall but sudden pain lower back. Claudia Chavez declines any workup. Discussed possible compression fx. Claudia Chavez endorses wishes are for comfort care, will send in baclofen  Rx: Baclofen 10mg  tid prn pain/spasm; #90; No RF  I spent 27 minutes providing this consultation. More than 50% of the time in  this consultation was spent in counseling and care coordination. PPS: 50%  HOSPICE ELIGIBILITY/DIAGNOSIS: yes per Hospice Physicians  Chief Complaint: Follow up visit for palliative consult for complex medical decision making  HISTORY OF PRESENT ILLNESS:  Claudia Chavez is a 65 y.o. year old female  with multiple medical problems including severe COPD, history of non-small cell right lung cancer status postchemotherapy and right lower lobe resection and adjuvant XRT.  Chronic issues with cavitary infection requiring drainage and multiple previous courses of antibiotics.  Patient is followed by ID.  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  Patient is not interested in further antibiotics.    History obtained from review of EMR, discussion with Claudia Chavez.  I reviewed available labs, medications, imaging, studies and related documents from the EMR.  Records reviewed and summarized above.   ROS Full 10 system review of systems performed and negative with exception of: as per HPI.   Physical Exam: Deferred Psych: non-anxious affect, A and O x 3  Questions and concerns were addressed. The patient/family was encouraged to call with questions and/or concerns. My business card was provided. Provided general support and encouragement, no other unmet needs identified   Thank you for the opportunity to participate in  the care of Claudia Chavez.  The palliative care team will continue to follow. Please call our office at [phone] if we can be of additional assistance.   This chart was dictated using voice recognition software.  Despite best efforts to proofread,  errors can occur which can change the documentation meaning.   Kalis Friese Ihor Gully, NP

## 2021-07-08 NOTE — Progress Notes (Signed)
Referral for hospice per Natalia Leatherwood, NP request

## 2021-07-14 ENCOUNTER — Other Ambulatory Visit: Payer: Self-pay | Admitting: Hospice and Palliative Medicine

## 2021-07-14 MED ORDER — SENNOSIDES-DOCUSATE SODIUM 8.6-50 MG PO TABS: 1.0000 | ORAL_TABLET | Freq: Every evening | ORAL | 4 refills | Status: AC | PRN

## 2021-07-14 MED ORDER — ONDANSETRON HCL 4 MG PO TABS: 4.0000 mg | ORAL_TABLET | Freq: Three times a day (TID) | ORAL | 3 refills | Status: AC | PRN

## 2021-07-14 MED ORDER — TRAMADOL HCL 50 MG PO TABS: 50.0000 mg | ORAL_TABLET | Freq: Four times a day (QID) | ORAL | 0 refills | Status: DC | PRN

## 2021-07-14 NOTE — Progress Notes (Signed)
Hospice nurse, Anderson Malta, requested refill of tramadol.

## 2021-08-19 ENCOUNTER — Other Ambulatory Visit: Payer: Self-pay | Admitting: Hospice and Palliative Medicine

## 2021-08-19 IMAGING — CT CT IMAGE GUIDED DRAINAGE BY PERCUTANEOUS CATHETER
2 of 3 series · 8 of 14 positions shown, 10 images · non-contrast
Comparison: Chest CT-03/18/2020

INDICATION: Chronic atypical infection, now with loculated hydropneumothorax.
Please perform CT-guided placement of chest tube for infection
source control purposes.

EXAM:
CT IMAGE GUIDED DRAINAGE BY PERCUTANEOUS CATHETER

[Series 2: i-spiral 5.0 b30f · axial · 0.55mm/px · z∈[-208,-88]mm · 5 of 37 slices shown, 7 images (1 of 2)]
[im 7/37  soft-tissue]
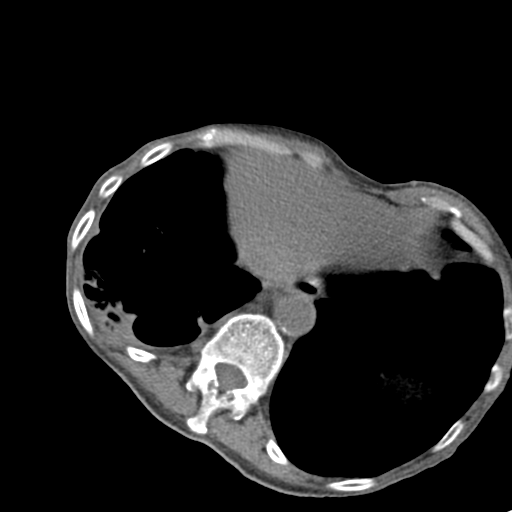
[im 7/37  bone]
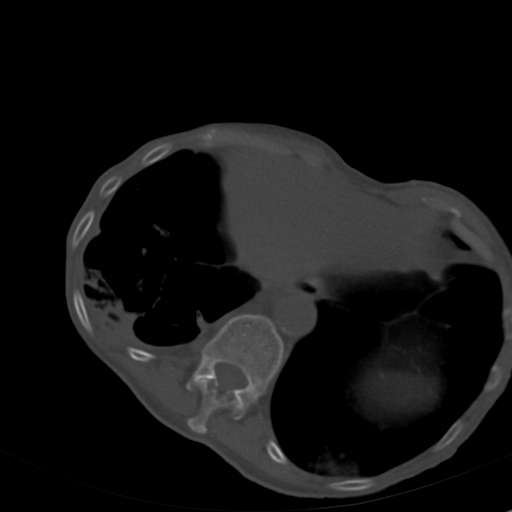
[im 13/37  bone]
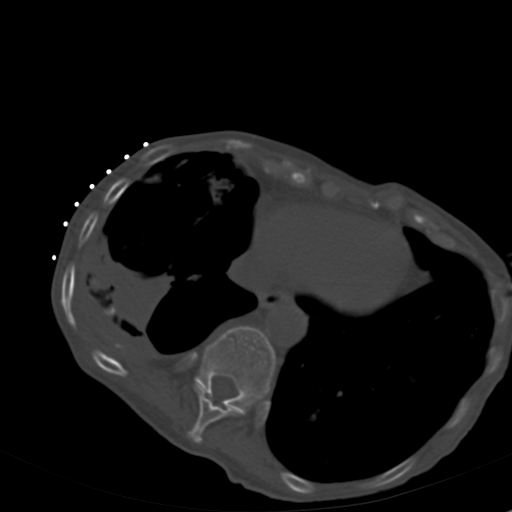
[im 19/37  bone]
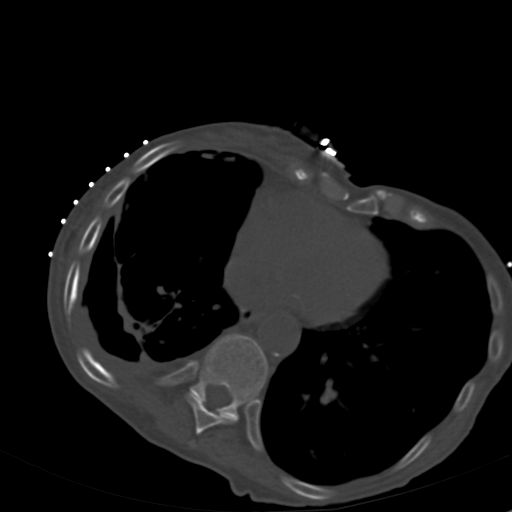
[im 25/37  bone]
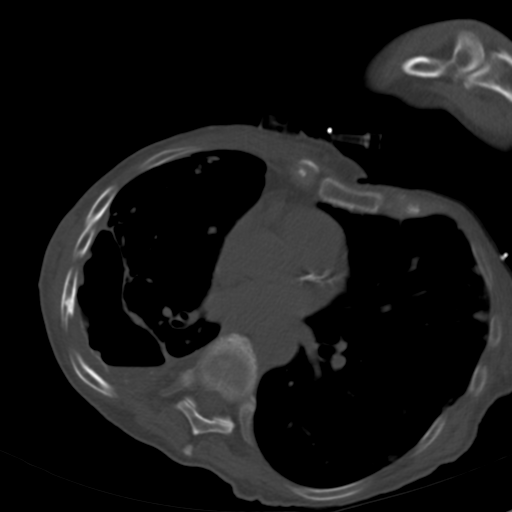
[im 31/37  soft-tissue]
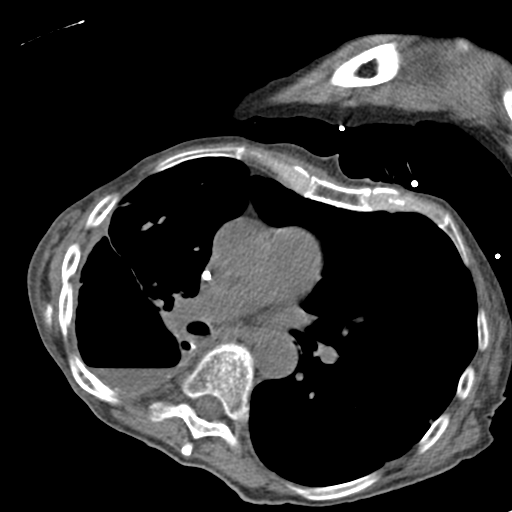
[im 31/37  bone]
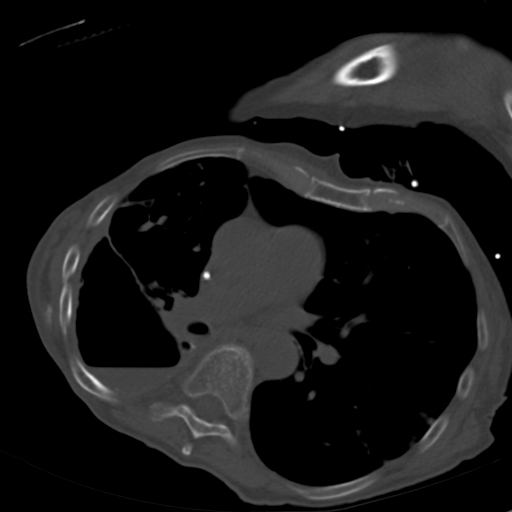

[Series 4: i-spiral 5.0 b30f · axial · 0.55mm/px · z∈[-185,-105]mm · 3 of 33 slices shown (2 of 2)]
[im 9/33  bone]
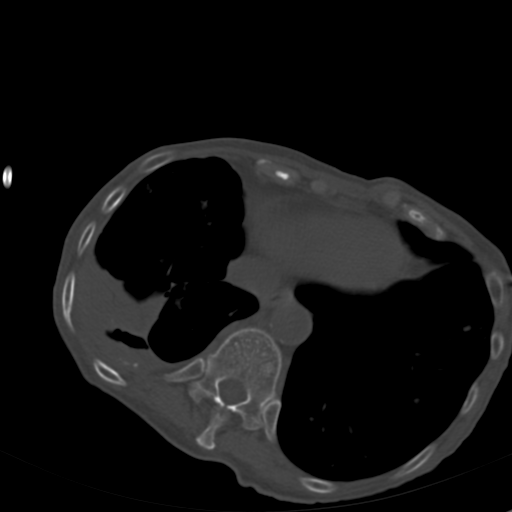
[im 17/33  bone]
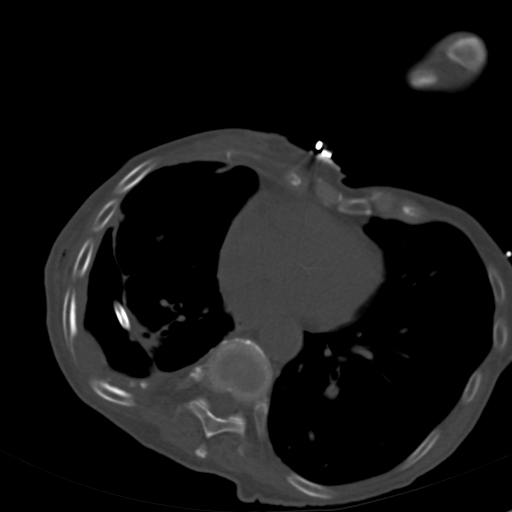
[im 25/33  bone]
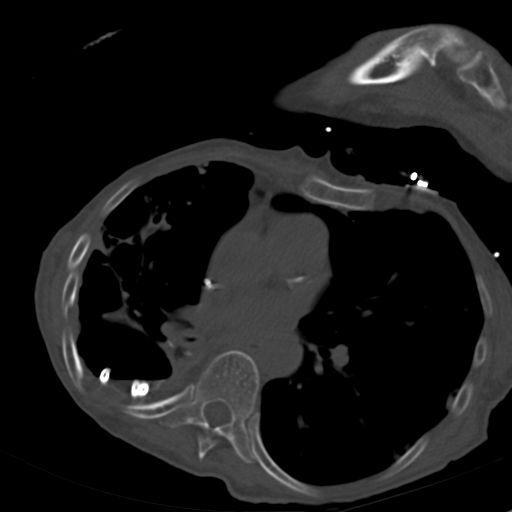

[8 of 14 positions shown; findings below may reference images not displayed]

MEDICATIONS:
None

ANESTHESIA/SEDATION:
Moderate (conscious) sedation was employed during this procedure. A
total of Versed 1 mg and Fentanyl 50 mcg was administered
intravenously.

Moderate Sedation Time: 12 minutes. The patient's level of
consciousness and vital signs were monitored continuously by
radiology nursing throughout the procedure under my direct
supervision.

CONTRAST:  None

COMPLICATIONS:
None immediate.

PROCEDURE:
Informed written consent was obtained from the patient after a
discussion of the risks, benefits and alternatives to treatment. The
patient was placed supine, slightly LPO on the CT gantry and a pre
procedural CT was performed re-demonstrating the known right-sided
loculated hydropneumothorax, grossly unchanged compared to the
12/08/2019 examination. The procedure was planned. A timeout was
performed prior to the initiation of the procedure.

The skin overlying the inferolateral aspect of the right chest was
prepped and draped in the usual sterile fashion. The overlying soft
tissues were anesthetized with 1% lidocaine with epinephrine.
Appropriate trajectory was planned with the use of a 22 gauge spinal
needle. An 18 gauge trocar needle was advanced into the
abscess/fluid collection and a short Amplatz super stiff wire was
coiled within the right pleural space. Appropriate positioning was
confirmed with a limited CT scan. The tract was serially dilated
allowing placement of a 14 French all-purpose drainage catheter.
Appropriate positioning was confirmed with a limited postprocedural
CT scan.

Approximately 5 ml of purulent fluid was aspirated. The tube was
connected to a pleural vac device and sutured in place. A dressing
was placed. The patient tolerated the procedure well without
immediate post procedural complication.
IMPRESSION: Successful CT guided placement of a 14 French all purpose drain
catheter into the loculated right-sided hydropneumothorax with
aspiration of 5 mL of purulent fluid. Samples were sent to the
laboratory as requested by the ordering clinical team.

## 2021-08-19 MED ORDER — ALBUTEROL SULFATE HFA 108 (90 BASE) MCG/ACT IN AERS: 2.0000 | INHALATION_SPRAY | Freq: Four times a day (QID) | RESPIRATORY_TRACT | 3 refills | Status: AC | PRN

## 2021-08-19 MED ORDER — TRAMADOL HCL 50 MG PO TABS: 50.0000 mg | ORAL_TABLET | Freq: Four times a day (QID) | ORAL | 0 refills | Status: DC | PRN

## 2021-08-19 NOTE — Progress Notes (Signed)
Hospice requested refill of albuterol and tramadol.  Heart sent to pharmacy.

## 2021-08-20 IMAGING — DX DG CHEST 1V PORT
1 series · 1 of 1 positions shown · non-contrast
Comparison: Earlier today

CLINICAL DATA: Postop check for right-sided chest tube

EXAM:
PORTABLE CHEST 1 VIEW

[chest ap]
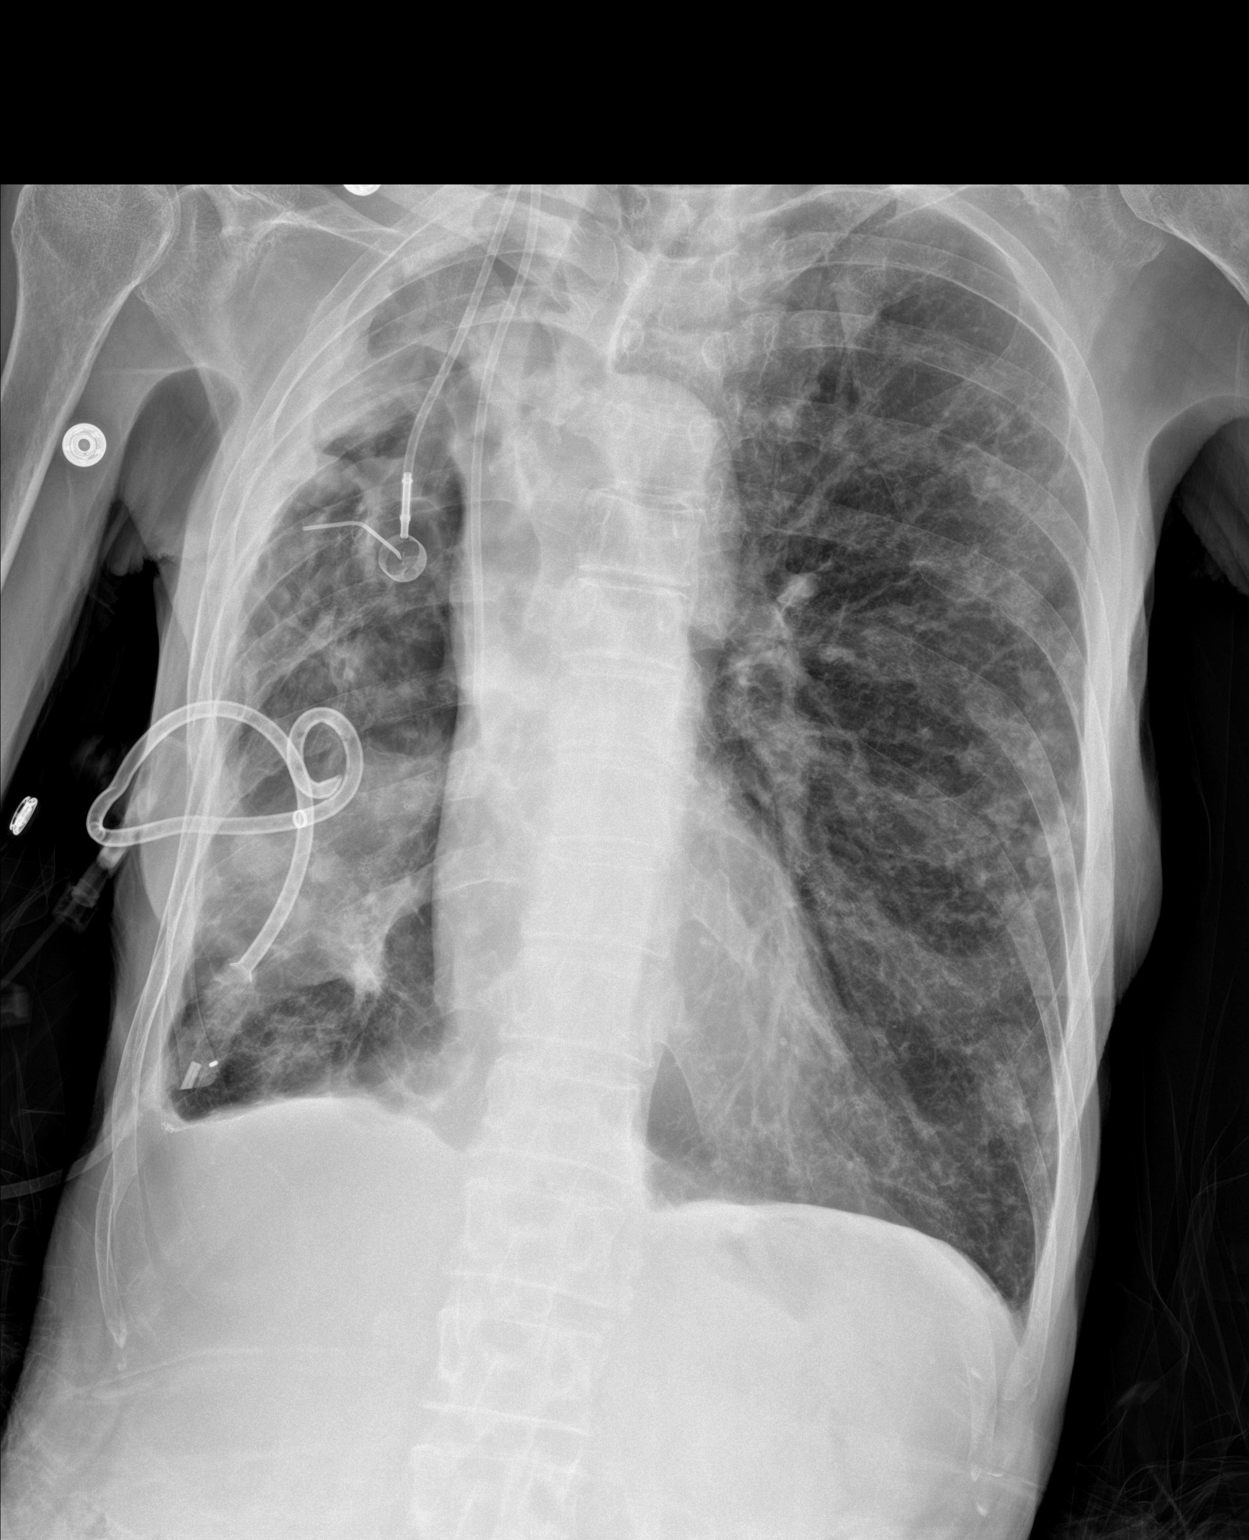

[1 of 1 positions shown; findings below may reference images not displayed]

FINDINGS: Right-sided chest tube in stable position. There is a large
right-sided cavity by CT with some increased density over the right
lower chest which may be fluid. Reticulonodular opacities that are
seen on both sides. Normal heart size. Unremarkable porta catheter
positioning.
IMPRESSION: Right-sided pleuroparenchymal cavity with stable chest tube
positioning. Hazy increased density inferiorly, possible fluid
accumulation.

## 2021-08-20 IMAGING — DX DG CHEST 1V PORT
1 series · 1 of 1 positions shown · non-contrast
Comparison: 03/08/2020

CLINICAL DATA: Chest tube placement

EXAM:
PORTABLE CHEST 1 VIEW

[chest ap]
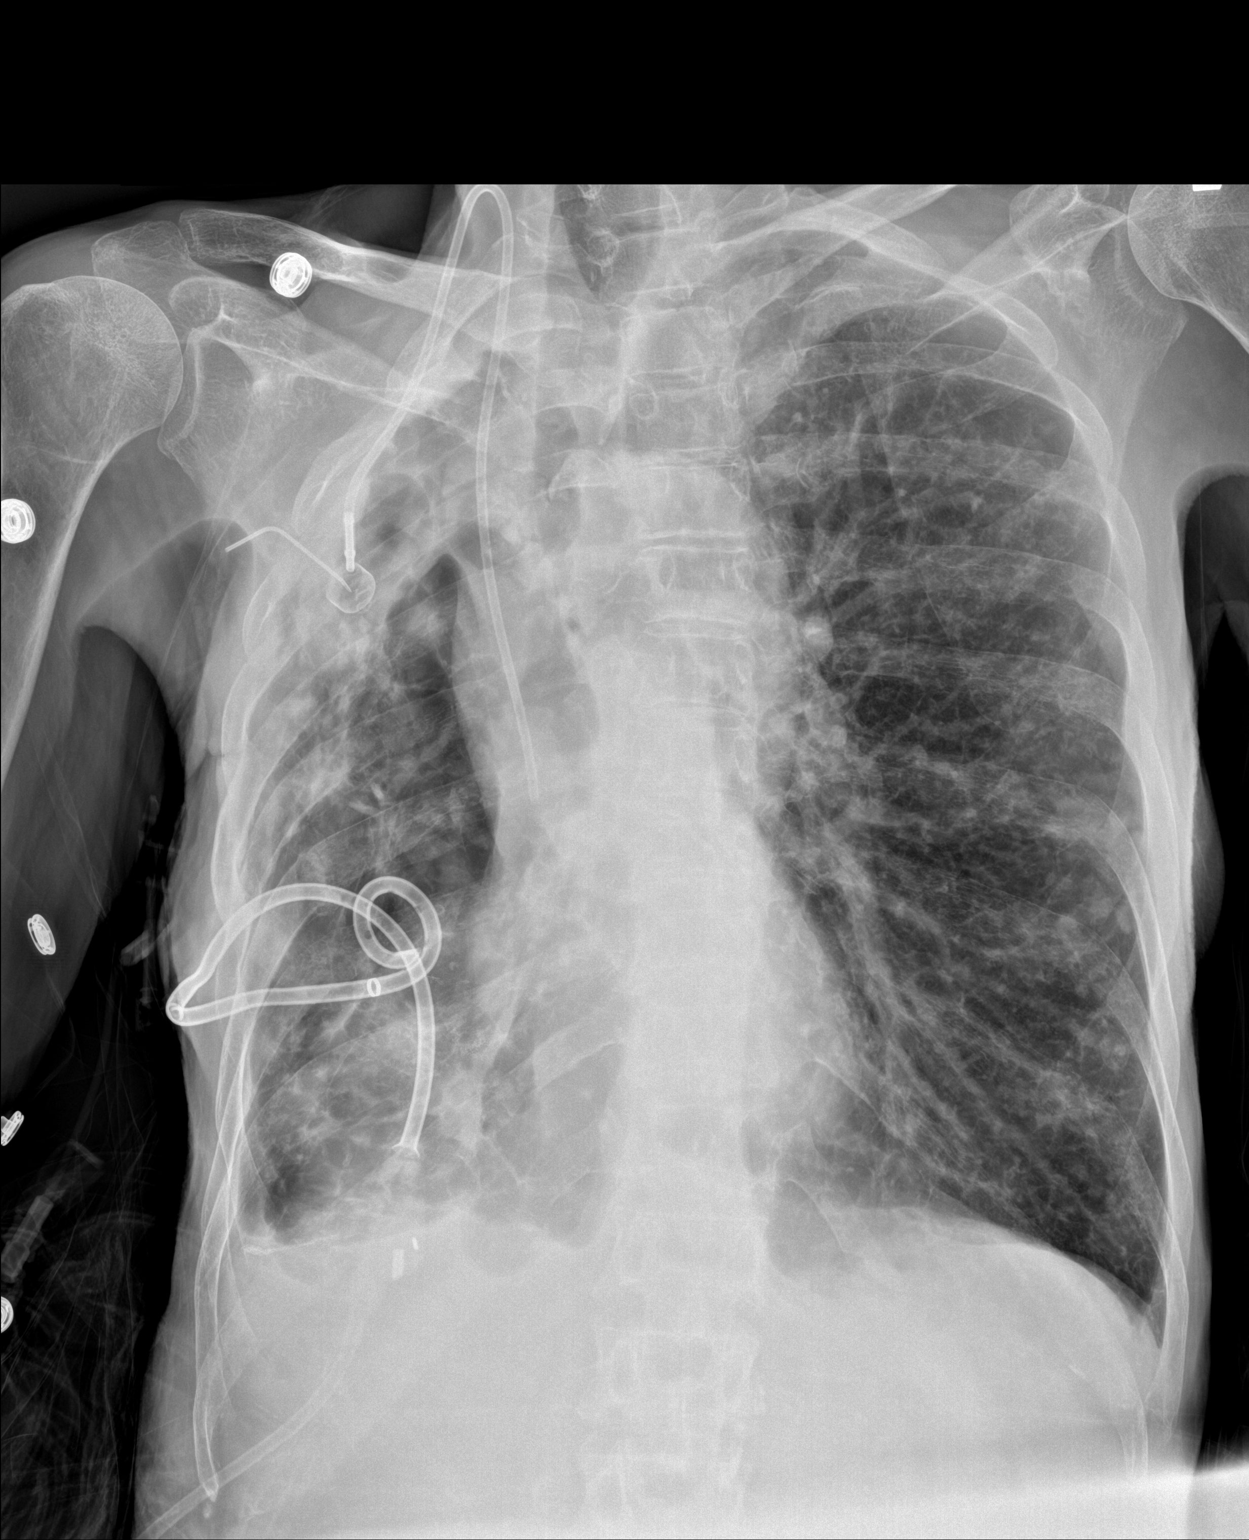

[1 of 1 positions shown; findings below may reference images not displayed]

FINDINGS: Right-sided pigtail catheter in place. No visible pneumothorax or
increasing opacity. There is a large right-sided gas collection by
CT which is likely both pleural and parenchymal. Reticulonodular
opacity on both sides. Porta catheter in good position. Normal heart
size.
IMPRESSION: Chest tube placement in the right-sided cavity with no detected
change from CT 03/18/2020.

## 2021-08-22 ENCOUNTER — Other Ambulatory Visit: Payer: Self-pay

## 2021-08-22 MED ORDER — MIRTAZAPINE 30 MG PO TABS: 30.0000 mg | ORAL_TABLET | Freq: Every day | ORAL | 3 refills | Status: AC

## 2021-08-24 ENCOUNTER — Telehealth: Payer: Self-pay | Admitting: Hospice and Palliative Medicine

## 2021-08-24 NOTE — Telephone Encounter (Signed)
Received a call from patient's hospice nurse, Tammi Klippel.  Patient is reportedly doing well and has requested an oxygen concentrator to allow her more mobility outside the home.  Hospice is unable to pay for this and therefore patient is requested revocation of hospice services.  I requested the hospice nurse talk to patient to see if she is interested in follow-up with [place] in the cancer center.

## 2021-08-24 NOTE — Telephone Encounter (Signed)
I received a call from patient. She reports she is doing well without significant changes or concerns. She did confirm that she wants to revoke hospice as she is not really using them much (one nurse visit about every two weeks). We discussed follow up clinic visit but she declined this. She would prefer to have periodic telephone/virtual visits. She is not interested in further work-up or treatment of cancer. Will plan virtual visit with me in about a month.   Christin - would you also be able to follow her again in the home? She would like hospice back at some point.

## 2021-08-31 ENCOUNTER — Telehealth: Payer: Self-pay

## 2021-08-31 NOTE — Telephone Encounter (Signed)
355 pm.  Notified that patient discharged from hospice care and will need to be followed by Palliative Care.  Phone call made to patient to schedule a home visit.  No answer to cell phone and unable to leave a message.  Will try again at a later date.

## 2021-09-01 ENCOUNTER — Telehealth: Payer: Self-pay

## 2021-09-01 NOTE — Telephone Encounter (Signed)
1250 pm.  Phone call made to patient to schedule a home visit after discharge from hospice.  Patient is agreeable to next Thursday at 1 pm.

## 2021-09-08 ENCOUNTER — Other Ambulatory Visit: Payer: Self-pay

## 2021-09-08 ENCOUNTER — Telehealth: Payer: Self-pay

## 2021-09-08 ENCOUNTER — Other Ambulatory Visit: Payer: PPO

## 2021-09-08 VITALS — BP 108/80 | HR 88 | Temp 97.4°F | Resp 18

## 2021-09-08 DIAGNOSIS — Z515 Encounter for palliative care: Secondary | ICD-10-CM

## 2021-09-08 NOTE — Telephone Encounter (Signed)
Patient with oncology visit on 10/12/21 at 1 pm.   Phone call made to patient to advise of appointments on 10/10/21 for CT Scan and 10/12/21 with Oncology.   Patient states she was not aware of upcoming appointments.  Palliative Care visit rescheduled for Tuesday, January 10 @ 1230 pm.   Notified PC scheduler of date change.

## 2021-09-08 NOTE — Progress Notes (Signed)
PATIENT NAME: Claudia Chavez DOB: [birth year] MRN: [id number]  PRIMARY CARE PROVIDER: Maryland Pink, MD  RESPONSIBLE PARTY:  Acct ID - Guarantor Home Phone Work Phone Relationship Acct Type  [id number] ANNE-MARIE, [phone]  Self P/F     [address] [place], [place] [address]    PLAN OF CARE and INTERVENTIONS:               1.  GOALS OF CARE/ ADVANCE CARE PLANNING:  Remain home and independent for as long as possible.               2.  PATIENT/CAREGIVER EDUCATION:  Life Alert               4. PERSONAL EMERGENCY PLAN:  Activate 911 for emergencies.                5.  DISEASE STATUS:  Patient recently discharged from hospice care.  States she did not need the amount of support they offer at this time.  She is open to readmission to hospice when needed.   Safety:  Patient currently has a Government social research officer in place.  The pendant has become to heavy for patient to wear around her neck.  She is asking form something smaller or lighter.  I contacted Linton Rump for [employer] and have left her a message regarding patient's concerns.  Waiting for call back.   Respiratory:  Patient currently has O2 @ 2 L via [place] mostly at night.  Patient states she has a oxygen tank in the event she looses power.  She is asking about a portable tank through [employer] that she can take with her when she is out of the home.  Advised that I would follow up with [employer] and order. Oxygen is used PRN during the daytime.   Shortness of breath with minimal exertion.  Coughing at times with sputum color that varies from clear to a yellow.  Has tessalon pearls and is using them daily to decrease coughing.   Constipation:  Patient denies constipation at this time.  She has Senna S ordered but this is not in the home.  She has requested this be filled so she can have it on hand in the event it is needed.   Phone call made to [facility] pharmacy.  They have a refill left on Senna S and will fill this today.    HISTORY OF  PRESENT ILLNESS:  65 year female with neoplasm of the lung.  Patient is a readmit to Palliative Care services after discharge from Hospice.   Patient is being followed every 4-8 weeks and PRN.  Next visit scheduled with Christin Gusler, NP for 10/12/21 @ 1230 pm.  CODE STATUS: DNR ADVANCED DIRECTIVES: Yes MOST FORM: No PPS: 50%   PHYSICAL EXAM:   VITALS: Today's Vitals   09/08/21 1244  BP: 108/80  Pulse: 88  Resp: 18  Temp: (!) 97.4 F (36.3 C)  SpO2: 99%  PainSc: 0-No pain    LUNGS: scattered rhonchi bilaterally CARDIAC: Cor RRR}  EXTREMITIES: - edema present. SKIN: Skin color, texture, turgor normal. No rashes or lesions or normal  NEURO: No issues noted.        Lorenza Burton, RN

## 2021-09-12 ENCOUNTER — Other Ambulatory Visit: Payer: Self-pay | Admitting: Internal Medicine

## 2021-09-12 ENCOUNTER — Telehealth: Payer: Self-pay

## 2021-09-12 NOTE — Telephone Encounter (Signed)
245 pm.  [employer] contacted regarding portable tanks for patient.  Spoke with Dominica who states a new order for daytime use would need to be obtained.  Once this order was in place, portable tanks could be sent to patient.  3 pm.  Message received from patient requesting a call back.  Return call made and  patient advised me to disregard message.  She thought Eliquis was not going to be filled but when she went to the pharmacy to pick up other medications, she was told medication would be ready in an hour.    Advised I had spoken to [employer] and above information was provided.  Patient has asked if I could provide sizes of tanks and I advised that I would send her a picture graph that displays the different sizes.  Patient will review and let me know if she would like to proceed.  342 pm.  Phone call made to Linton Rump with  [employer].  Call goes to VM.  Phone call made to [phone] to obtain assistance on changing out the pendant for patient.   Above representative is no longer with the agency.  [employer] will directly contact patient to assist with a different size pendant.

## 2021-09-14 ENCOUNTER — Other Ambulatory Visit: Payer: Self-pay | Admitting: Internal Medicine

## 2021-09-16 ENCOUNTER — Telehealth: Payer: Self-pay

## 2021-09-16 NOTE — Telephone Encounter (Signed)
1230 pm.  Phone call made to patient to advise Billey Chang, NP is okay with prn oxygen during the day.   Questioned patient if she wanted to proceed with order to [employer] patient agrees.  Discussed [employer] and patient has not heard from this company.  I encouraged her to contact them as I would not be able to assist with changing the pendant due to HIPPA restrictions.  Patient advised she would contact the company regarding her concerns.  Fax sent to [employer] for prn oxygen during the day and need for portable tank.

## 2021-09-20 ENCOUNTER — Other Ambulatory Visit: Payer: Self-pay | Admitting: *Deleted

## 2021-09-20 MED ORDER — TRAMADOL HCL 50 MG PO TABS: 50.0000 mg | ORAL_TABLET | Freq: Four times a day (QID) | ORAL | 0 refills | Status: DC | PRN

## 2021-09-22 ENCOUNTER — Inpatient Hospital Stay: Payer: PPO | Attending: Hospice and Palliative Medicine | Admitting: Hospice and Palliative Medicine

## 2021-09-22 DIAGNOSIS — Z515 Encounter for palliative care: Secondary | ICD-10-CM | POA: Diagnosis not present

## 2021-09-22 DIAGNOSIS — J449 Chronic obstructive pulmonary disease, unspecified: Secondary | ICD-10-CM | POA: Diagnosis not present

## 2021-09-22 DIAGNOSIS — A310 Pulmonary mycobacterial infection: Secondary | ICD-10-CM | POA: Diagnosis not present

## 2021-09-22 NOTE — Progress Notes (Deleted)
I did not reach patient for scheduled virtual visit.  Unable to leave voicemail.  Will reschedule.

## 2021-09-22 NOTE — Progress Notes (Addendum)
Virtual Visit via Telephone Note  I connected with Georgianne Fick on 09/22/21 at  2:00 PM EST by telephone and verified that I am speaking with the correct person using two identifiers.  Location: Patient: Home Provider: Clinic   I discussed the limitations, risks, security and privacy concerns of performing an evaluation and management service by telephone and the availability of in person appointments. I also discussed with the patient that there may be a patient responsible charge related to this service. The patient expressed understanding and agreed to proceed.   History of Present Illness: Ms. Nickia Boesen is a 65 year old woman with multiple medical problems including severe COPD, history of non-small cell right lung cancer status postchemotherapy and right lower lobe resection and adjuvant XRT.  She has history of recurrent cavitary infection requiring drainage and multiple previous courses of antibiotics.   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  Patient was not interested in further antibiotics.  This is presumed to be a terminal condition and patient was ultimately referred to hospice, which is following her at home.  However, patient gradually improved and ultimately revoked hospice and is now followed by palliative care.    Observations/Objective: I spoke with patient by phone.  She reports she is doing reasonably well.  She denies any significant changes or concerns.  She does continue to endorse chronic shortness of breath, made worse by exertion.  She reports having some difficulty making a cake.  She reports stable performance status and appetite.  She says she continues to do well after revoking hospice.  I do appreciate palliative  care involvement at home as patient will likely ultimately require hospice again at some point in the future.   Discussed with Maxwell Caul, RN, who has evaluated patient at home.  Patient is chronically hypoxic with SPO2 measured 88% on room air and 83% with exertion but corrected to 95% on 2 L O2.  Will order oxygen.  Assessment and Plan: NSCLC/cavitary lesions in the right lung -Best supportive care/palliative care following at home.  Patient will benefit from future hospice involvement  Neoplasm related pain -continue tramadol as needed.  This is also helping with dyspnea.  This was refilled on 09/20/2021.  PDMP reviewed.  ACP -she has a DNR/DNI  Follow Up Instructions: Follow-up MyChart visit 1 month   I discussed the assessment and treatment plan with the patient. The patient was provided an opportunity to ask questions and all were answered. The patient agreed with the plan and demonstrated an understanding of the instructions.   The patient was advised to call back or seek an in-person evaluation if the symptoms worsen or if the condition fails to improve as anticipated.  I provided 5 minutes of non-face-to-face time during this encounter.   Irean Hong, NP

## 2021-09-22 NOTE — Addendum Note (Signed)
Addended by: Altha Harm R on: 09/22/2021 02:08 PM   Modules accepted: Level of Service

## 2021-09-23 ENCOUNTER — Other Ambulatory Visit: Payer: Self-pay

## 2021-09-23 ENCOUNTER — Other Ambulatory Visit: Payer: PPO

## 2021-09-23 VITALS — HR 105 | Temp 97.8°F | Resp 28

## 2021-09-23 DIAGNOSIS — Z515 Encounter for palliative care: Secondary | ICD-10-CM

## 2021-09-23 NOTE — Progress Notes (Signed)
PATIENT NAME: LACHERYL NIESEN DOB: 1956/08/16 MRN: [id number]  PRIMARY CARE PROVIDER: Maryland Pink, MD  RESPONSIBLE PARTY:  Acct ID - Guarantor Home Phone Work Phone Relationship Acct Type  [id number] SHANTELE, [id number]  Self P/F     [address] [place], [place] [address]    PLAN OF CARE and INTERVENTIONS:               1.  GOALS OF CARE/ ADVANCE CARE PLANNING: DNR in place.  Patient desires to remain home and independent for as long as possible.                2.  PATIENT/CAREGIVER EDUCATION:  Oxygen               4. PERSONAL EMERGENCY PLAN:  Activate 911 for emergencies.                5.  DISEASE STATUS:  Visit completed to follow up on daytime oxygen needs and obtain O2 saturation readings. Currently has a oxygen concentrator in the home for nocturnal use.    O2 reading at rest 88% on room air.  O2 reading with exertion 83% on room air.  O2 reading with Oxygen @ 2L via [place] 95% with exertion.  Patient with significant shortness of breath with exertion.  Requires 5 minute rest between ambulating without O2 and with O2.  Patient weaker today and "shaky" and notes it has not been a good week.  Typically, am hours are more difficult due to shortness of breath and pm hours are better.  Shortness of breath is continuing  throughout the day this week.  Patient is keeping her oxygen in place more often during the daytime hours.  No increase in coughing reported.  Drainage device in place to right lower lung.  Patient denies any change with drainage color or amount.   Follow up completed on life alert.  Patient will keep her current pendant as it has GPS monitoring.  Agency will send patient a clip so her pendant can be attached to her waist.    HISTORY OF PRESENT ILLNESS:  65 year old female with Malignant Neoplasm of the Right Lung.  Patient is followed by Palliative Care monthly and PRN.   CODE STATUS: DNR ADVANCED DIRECTIVES: Yes MOST FORM: No PPS: 50%   PHYSICAL EXAM:    VITALS: Today's Vitals   09/23/21 1343  Pulse: (!) 105  Resp: (!) 28  Temp: 97.8 F (36.6 C)  SpO2: (!) 88%  PainSc: 0-No pain    LUNGS: clear to auscultation  CARDIAC: Cor Tachy}  EXTREMITIES: - for edema SKIN: Skin color, texture, turgor normal. No rashes or lesions or normal  NEURO: positive for weakness       Lorenza Burton, RN

## 2021-09-27 ENCOUNTER — Telehealth: Payer: Self-pay

## 2021-09-27 NOTE — Telephone Encounter (Signed)
Received message to call Murrell Redden at [facility] to follow up re: oxygen order. Returned call to [employer]. Spoke with Barnett Applebaum who shared missing information to complete order for oxygen.

## 2021-09-29 ENCOUNTER — Telehealth: Payer: Self-pay

## 2021-09-29 NOTE — Telephone Encounter (Signed)
09/29/21 @ 10:40 AM: PC SW placed TC to patient to f/u on PC RN visit last week in regard to O2 stats and needs.  Call unsuccessful. SW unable to LVM for patient. PC will attempt to f/u call at later date/time.

## 2021-10-06 ENCOUNTER — Other Ambulatory Visit: Payer: PPO

## 2021-10-06 ENCOUNTER — Other Ambulatory Visit: Payer: Self-pay

## 2021-10-06 ENCOUNTER — Other Ambulatory Visit: Payer: Self-pay | Admitting: *Deleted

## 2021-10-06 ENCOUNTER — Telehealth: Payer: Self-pay

## 2021-10-06 DIAGNOSIS — E611 Iron deficiency: Secondary | ICD-10-CM

## 2021-10-06 DIAGNOSIS — Z515 Encounter for palliative care: Secondary | ICD-10-CM

## 2021-10-06 NOTE — Telephone Encounter (Signed)
Patient called and wanted to know if her chest tube could be re-stitched-she stated it is backing out as it did last year and that we put sutures in the help with it. She is currently on Palliative care. I spoke with Thedore Mins and he stated we should refer her to [facility] in [place]. Patient is on oxygen 24/7 due to her breathing. Thedore Mins also suggested letting Hospice know and see what they can offer.  I called patient back after speaking with Thedore Mins and she stated Almyra Free her hospice nurse is going to call her later today to see what she was told to do from our office. I let the patient know that I was sorry to hear this and if there is anything I could do to please call.

## 2021-10-06 NOTE — Progress Notes (Signed)
PATIENT NAME: Claudia Chavez DOB: 16-Feb-1956 MRN: [id number]  PRIMARY CARE PROVIDER: Maryland Pink, MD  RESPONSIBLE PARTY:  Acct ID - Guarantor Home Phone Work Phone Relationship Acct Type  [id number] JUDYE, [phone]  Self P/F     [address] [place], [place] [address]   Due to the COVID-19 crisis, this visit was done via telemedicine from my office and it was initiated and consent by this patient and or family.  I connected with  Claudia Chavez OR PROXY on 10/06/21 telephonically and verified that I am speaking with the correct person using two identifiers.   I discussed the limitations of evaluation and management by telemedicine. The patient expressed understanding and agreed to proceed.   PLAN OF CARE and INTERVENTIONS:               1.  GOALS OF CARE/ ADVANCE CARE PLANNING:  Remain home and independent for as long as possible.                2.  PATIENT/CAREGIVER EDUCATION:  Awaiting direction on chest tube issue.                4. PERSONAL EMERGENCY PLAN:  Activate 911 for emergencies.               5.  DISEASE STATUS:  Connected by telephone with patient to follow up on respiratory status and portable tanks. She has reports tanks have not arrived and she has not heard from [facility].  Patient notes her breathing is about the same as it was 2 weeks ago.  Patient believes the tubing to her lung maybe have dislodged.  She notes an increase in coughing.  Patient is calling Dr. Genevive Bi this afternoon to see if she can be assessed.  Patient previously had this situation occur and noted that provider was able to correct the situation.  Message received from patient at 3 pm stating Dr. Genevive Bi would not be able to assist.  Patient states her tube is not collecting the drainage as it previously did.  Drainage is collecting in the bag but also outside of her bag.   I contacted Christin Gusler, NP to advise of above and she has requested that I contact Billey Chang, NP.  Secure message sent  to Billey Chang, NP. Response pending.   Phone call made to [employer] to follow up on portable tanks. A new order is needed to specify continuous O2.  Orders received from Billey Chang, NP that patient may have O2 at 2 L via [place] continuous.  New order faxed to [place] and confirmation received.  HISTORY OF PRESENT ILLNESS:  66 year old female with Malignant Neoplasm of the Right Lung.  Patient is followed by Palliative Care monthly and PRN  CODE STATUS: DNR ADVANCED DIRECTIVES: Yes MOST FORM: No PPS: 50%     Lorenza Burton, RN

## 2021-10-07 ENCOUNTER — Other Ambulatory Visit: Payer: Self-pay

## 2021-10-07 ENCOUNTER — Inpatient Hospital Stay: Payer: PPO | Attending: Hospice and Palliative Medicine | Admitting: Hospice and Palliative Medicine

## 2021-10-07 VITALS — BP 117/86 | HR 113 | Temp 99.2°F | Resp 22

## 2021-10-07 DIAGNOSIS — J449 Chronic obstructive pulmonary disease, unspecified: Secondary | ICD-10-CM | POA: Diagnosis not present

## 2021-10-07 DIAGNOSIS — J869 Pyothorax without fistula: Secondary | ICD-10-CM | POA: Insufficient documentation

## 2021-10-07 DIAGNOSIS — Z902 Acquired absence of lung [part of]: Secondary | ICD-10-CM | POA: Diagnosis not present

## 2021-10-07 DIAGNOSIS — C3431 Malignant neoplasm of lower lobe, right bronchus or lung: Secondary | ICD-10-CM | POA: Diagnosis not present

## 2021-10-07 DIAGNOSIS — C3411 Malignant neoplasm of upper lobe, right bronchus or lung: Secondary | ICD-10-CM

## 2021-10-07 DIAGNOSIS — Z9981 Dependence on supplemental oxygen: Secondary | ICD-10-CM | POA: Diagnosis not present

## 2021-10-07 MED ORDER — AMOXICILLIN-POT CLAVULANATE 875-125 MG PO TABS: 1.0000 | ORAL_TABLET | Freq: Two times a day (BID) | ORAL | 0 refills | Status: AC

## 2021-10-07 NOTE — Progress Notes (Signed)
Pt reports that chest tubing has migrated out of place and leaking fluid around insertion site.Audible air leakage noted, worse than baseline. Pt states that she first noticed this a couple of days ago. Denies pain.

## 2021-10-07 NOTE — Progress Notes (Signed)
Symptom Management [place] at [facility] Telephone:([phone]) [phone] [phone]) [phone]  Patient Care Team: Maryland Pink, MD as PCP - General (Family Medicine) Cammie Sickle, MD as Medical Oncologist (Medical Oncology) Minna Merritts, MD as Consulting Physician (Cardiology) Efrain Sella, MD as Consulting Physician (Gastroenterology) Noreene Filbert, MD as Referring Physician (Radiation Oncology)   Name of the patient: Claudia Chavez  [id number]  1955-12-16   Date of visit: 10/07/21  Reason for Consult:  Eloyce Bultman is a 66 year old woman with multiple medical problems including severe COPD, O2 dependence, history of non-small cell right lung cancer status postchemotherapy and right lower lobe resection and adjuvant XRT.  She also has history of recurrent cavitary infection requiring drainage and multiple previous courses of antibiotics.  She has a permanent chest tube for management of chronic right lung abscess/empyema, which was placed by Dr. Genevive Bi on 03/29/2020.  Patient had previously been followed closely by ID but had patient opted to discontinue antibiotics and further work-up/treatment and was enrolled in hospice until services were revoked in November 2022.  Patient has subsequently been followed at home by palliative care.  Patient presents to [facility] today for evaluation of migration to her percutaneous lung drain.  She says that it moved several days ago and subsequently been leaking around the tube into her ostomy bag.  She reports a similar issue about a year ago and says that the tube was advanced by Dr. Genevive Bi.  Patient denies fever or chills but says that she has had some shortness of breath and coughing.  Denies any neurologic complaints. Denies recent fevers or illnesses. Denies any easy bleeding or bruising.  Her appetite is poor.  Denies chest pain. Denies any nausea, vomiting, constipation, or diarrhea. Denies urinary  complaints. Patient offers no further specific complaints today.   PAST MEDICAL HISTORY: Past Medical History:  Diagnosis Date   Cancer of lower lobe of right lung ([place]) 05/07/2015   COPD (chronic obstructive pulmonary disease) (HCC)    Dyspnea    Non-small cell lung cancer ([place])    Pneumonia 04/2015   Pulmonary Mycobacterium avium complex (MAC) infection ([place]) 10/25/2018    PAST SURGICAL HISTORY:  Past Surgical History:  Procedure Laterality Date   ABDOMINAL HYSTERECTOMY     COLONOSCOPY WITH PROPOFOL N/A 06/09/2018   Procedure: COLONOSCOPY WITH PROPOFOL;  Surgeon: Toledo, Benay Pike, MD;  Location: [facility] ENDOSCOPY;  Service: Gastroenterology;  Laterality: N/A;   ELBOW SURGERY Right 1995   ELECTROMAGNETIC NAVIGATION BROCHOSCOPY N/A 10/25/2017   Procedure: ELECTROMAGNETIC NAVIGATION BRONCHOSCOPY;  Surgeon: [name], MD;  Location: [facility] ORS;  Service: Cardiopulmonary;  Laterality: N/A;   ESOPHAGOGASTRODUODENOSCOPY N/A 06/08/2018   Procedure: ESOPHAGOGASTRODUODENOSCOPY (EGD);  Surgeon: Toledo, Benay Pike, MD;  Location: [facility] ENDOSCOPY;  Service: Gastroenterology;  Laterality: N/A;   FLEXIBLE BRONCHOSCOPY Right 10/10/2018   Procedure: FLEXIBLE BRONCHOSCOPY;  Surgeon: [name], MD;  Location: [facility] ORS;  Service: Cardiopulmonary;  Laterality: Right;   PORTACATH PLACEMENT Right 05/10/2015   Procedure: INSERTION PORT-A-CATH;  Surgeon: Nestor Lewandowsky, MD;  Location: [facility] ORS;  Service: General;  Laterality: Right;   VIDEO ASSISTED THORACOSCOPY (VATS)/THOROCOTOMY Right 08/18/2015   Procedure: VIDEO ASSISTED THORACOSCOPY (VATS)/THOROCOTOMY;  Surgeon: Nestor Lewandowsky, MD;  Location: [facility] ORS;  Service: General;  Laterality: Right;    HEMATOLOGY/ONCOLOGY HISTORY:  Oncology History Overview Note  # Squamous cell carcinoma of right  LOWER LOBE OF  lung stage IIIA based on PET scan Biopsy of the (August of 2016). 2.  After 3  cycles of chemotherapy patient underwent resection of lung mass (November,  2016) ypT2B   ypN0 [Dr.Oaks]  status post right lower lobectomy with a 3. She  was started on carboplatinum and Taxol on a weekly basis and radiation therapy from January of 2017 4. After 2 cycles of carboplatinum patient had neuropathy grade 2 interfering with work so chemotherapy was put on hold and radiation was continued (January 19th, 2017) 5.  Chemotherapy was discontinued because of progressive neuropathy.  Patient is finishing of radiation therapy on November 19, 2015  # NOV 2017- CT- 18m right hilar LN; PET- Feb 2018- NED.   # DEC-JAN 2019- RUL Lung nodule [s/p Bronch; Dr.Kasa- non-diagnostic] SBRT Feb 2019  #October 10, 2018 bronchoscopy [Dr.Kasa]-negative for malignancy/positive for MAC infection-ethambutol [Dr.Ravishankar]; [facility]- R LE DVT [xarelto]x STOP in end of dec 2019. Syncope- MRI- 13 mm cystic leision/asymptomatic/[facility]; Bil subacute cerebellar stroke;  AUG 2019 - GIB/anemia [EGD/colo- Dr.Toledo; NEG]; Aug 2019-  Severe hypokalemia- sec to [place]-resolved.   DIAGNOSIS: RLL stage III lung ca; RUL stage I   GOALS: curative  CURRENT/MOST RECENT THERAPY : Surveillaince    Malignant neoplasm of lung (HCC)  Malignant neoplasm of right upper lobe of lung (HCC)    ALLERGIES:  is allergic to keppra [levetiracetam], marinol [dronabinol], and lyrica [pregabalin].  MEDICATIONS:  Current Outpatient Medications  Medication Sig Dispense Refill   senna-docusate (SENNA S) 8.6-50 MG tablet Take 1 tablet by mouth at bedtime as needed for mild constipation. (Patient not taking: Reported on 09/08/2021) 45 tablet 4   albuterol (VENTOLIN HFA) 108 (90 Base) MCG/ACT inhaler Inhale 2 puffs into the lungs every 6 (six) hours as needed for wheezing or shortness of breath. 8 g 3   benzonatate (TESSALON) 100 MG capsule TAKE 1 CAPSULE(100 MG) BY MOUTH THREE TIMES DAILY AS NEEDED FOR COUGH 90 capsule 3   Budeson-Glycopyrrol-Formoterol (BREZTRI  AEROSPHERE) 160-9-4.8 MCG/ACT AERO Inhale 160 mcg into the lungs 2 (two) times daily. 5.9 g 11   Cholecalciferol (VITAMIN D3) 1000 units CAPS Take 2,000 Units by mouth daily.  (Patient not taking: Reported on 09/08/2021)     ELIQUIS 2.5 MG TABS tablet TAKE 1 TABLET(2.5 MG) BY MOUTH TWICE DAILY 60 tablet 2   ipratropium-albuterol (DUONEB) 0.5-2.5 (3) MG/3ML SOLN Take 3 mLs by nebulization every 4 (four) hours as needed. 360 mL 3   mirtazapine (REMERON) 30 MG tablet Take 1 tablet (30 mg total) by mouth at bedtime. 30 tablet 3   ondansetron (ZOFRAN) 4 MG tablet Take 1 tablet (4 mg total) by mouth every 8 (eight) hours as needed for nausea or vomiting. 45 tablet 3   Respiratory Therapy Supplies (FLUTTER) DEVI 1 each by Does not apply route daily. 1 each 0   traMADol (ULTRAM) 50 MG tablet Take 1-2 tablets (50-100 mg total) by mouth every 6 (six) hours as needed for moderate pain or severe pain. 60 tablet 0   vitamin B-12 (CYANOCOBALAMIN) 1000 MCG tablet Take 1,000 mcg by mouth daily. (Patient not taking: Reported on 09/08/2021)     No current facility-administered medications for this visit.   Facility-Administered Medications Ordered in Other Visits  Medication Dose Route Frequency Provider Last Rate Last Admin   sodium chloride flush (NS) 0.9 % injection 10 mL  10 mL Intravenous PRN BCammie Sickle MD        VITAL SIGNS: There were no vitals taken for this visit. There were no vitals filed for this visit.  Estimated body mass index is 15.3 kg/m as calculated from the following:   Height as of 05/04/21: _0  (1.549 m).   Weight as of 06/10/21: 81 lb (36.7 kg).  LABS: CBC:    Component Value Date/Time   WBC 8.7 06/10/2021 1247   HGB 11.5 (L) 06/10/2021 1247   HCT 35.2 (L) 06/10/2021 1247   PLT 435 (H) 06/10/2021 1247   MCV 100.3 (H) 06/10/2021 1247   NEUTROABS 6.2 06/10/2021 1247   LYMPHSABS 1.6 06/10/2021 1247   MONOABS 0.7 06/10/2021 1247   EOSABS 0.1 06/10/2021 1247   BASOSABS  0.1 06/10/2021 1247   Comprehensive Metabolic Panel:    Component Value Date/Time   NA 133 (L) 06/10/2021 1247   NA 137 07/30/2018 1245   K 4.2 06/10/2021 1247   CL 99 06/10/2021 1247   CO2 27 06/10/2021 1247   BUN 18 06/10/2021 1247   BUN 11 07/30/2018 1245   CREATININE 0.37 (L) 06/10/2021 1247   GLUCOSE 89 06/10/2021 1247   CALCIUM 8.8 (L) 06/10/2021 1247   AST 17 07/29/2020 0839   ALT 14 07/29/2020 0839   ALKPHOS 123 07/29/2020 0839   BILITOT 0.3 07/29/2020 0839   PROT 8.2 (H) 07/29/2020 0839   ALBUMIN 2.4 (L) 07/29/2020 0839    RADIOGRAPHIC STUDIES: No results found.  PERFORMANCE STATUS (ECOG) : 3 - Symptomatic, >50% confined to bed  Review of Systems Unless otherwise noted, a complete review of systems is negative.  Physical Exam General: NAD Cardiovascular: regular rate and rhythm Pulmonary: Coarse anterior/posterior fields, drain on right side of chest and closed in ostomy bag Abdomen: soft, nontender, + bowel sounds GU: no suprapubic tenderness Extremities: no edema, no joint deformities Skin: no rashes Neurological: Weakness but otherwise nonfocal  Assessment and Plan- Patient is a 66 y.o. female with multiple medical problems including severe COPD, O2 dependence, history of non-small cell right lung cancer status postchemotherapy and right lower lobe resection and adjuvant XRT.  Patient presents to [facility] for evaluation of migration to her percutaneous chest drain.   Chest drain/empyema -this drain does appear to have migrated and is somewhat coiled within her ostomy bag.  She does have malodorous drainage leaking around the drain.  I called and spoke with IR who recommended CT and then probably removal of the drain entirely.  Patient is pending CT on Monday.  I suggested that we send her today for chest x-ray to better characterize drain placement but patient stated that she wanted to wait and just get the CT on Monday.  We also discussed option of sending her to  the ER for further evaluation and management but the patient declined this.  We did discuss ER triggers in detail in the event that she worsens over the weekend.  I will start her on antibiotics with oral Augmentin, although the utility of this is likely limited in the setting of chronic infection.  Note the patient says that she is not interested in referral back to ID nor does she want to restart aggressive treatment or work-up for infection.  She continues to desire a more supportive care approach focusing on symptoms and quality of life.  She does not feel like she is ready to resume hospice care at this point but states that she does want hospice in the future when their services are needed.  Case and plan discussed in detail with Dr. Rogue Bussing.  Patient is scheduled to follow-up with Dr. Rogue Bussing next week.   Patient expressed understanding  and was in agreement with this plan. She also understands that She can call clinic at any time with any questions, concerns, or complaints.   Thank you for allowing me to participate in the care of this very pleasant patient.   Time Total: 30 minutes  Visit consisted of counseling and education dealing with the complex and emotionally intense issues of symptom management in the setting of serious illness.Greater than 50%  of this time was spent counseling and coordinating care related to the above assessment and plan.  Signed by: Altha Harm, PhD, NP-C

## 2021-10-10 ENCOUNTER — Encounter: Payer: Self-pay | Admitting: Internal Medicine

## 2021-10-10 ENCOUNTER — Telehealth: Payer: Self-pay | Admitting: Hospice and Palliative Medicine

## 2021-10-10 ENCOUNTER — Emergency Department: Payer: PPO | Admitting: Radiology

## 2021-10-10 ENCOUNTER — Emergency Department: Payer: PPO

## 2021-10-10 ENCOUNTER — Emergency Department
Admission: EM | Admit: 2021-10-10 | Discharge: 2021-10-10 | Disposition: A | Discharge status: Home / Self Care | Payer: PPO | Attending: Emergency Medicine | Admitting: Emergency Medicine

## 2021-10-10 ENCOUNTER — Other Ambulatory Visit (HOSPITAL_COMMUNITY): Payer: PPO

## 2021-10-10 ENCOUNTER — Ambulatory Visit: Admission: RE | Admit: 2021-10-10 | Payer: PPO | Source: Ambulatory Visit

## 2021-10-10 ENCOUNTER — Other Ambulatory Visit: Payer: Self-pay

## 2021-10-10 DIAGNOSIS — J449 Chronic obstructive pulmonary disease, unspecified: Secondary | ICD-10-CM | POA: Diagnosis not present

## 2021-10-10 DIAGNOSIS — R0600 Dyspnea, unspecified: Secondary | ICD-10-CM | POA: Diagnosis present

## 2021-10-10 DIAGNOSIS — Z85118 Personal history of other malignant neoplasm of bronchus and lung: Secondary | ICD-10-CM | POA: Diagnosis not present

## 2021-10-10 DIAGNOSIS — Z9689 Presence of other specified functional implants: Secondary | ICD-10-CM

## 2021-10-10 DIAGNOSIS — Z4682 Encounter for fitting and adjustment of non-vascular catheter: Secondary | ICD-10-CM | POA: Diagnosis not present

## 2021-10-10 DIAGNOSIS — U071 COVID-19: Secondary | ICD-10-CM | POA: Diagnosis not present

## 2021-10-10 DIAGNOSIS — J90 Pleural effusion, not elsewhere classified: Secondary | ICD-10-CM | POA: Diagnosis not present

## 2021-10-10 DIAGNOSIS — R0602 Shortness of breath: Secondary | ICD-10-CM

## 2021-10-10 DIAGNOSIS — J939 Pneumothorax, unspecified: Secondary | ICD-10-CM | POA: Diagnosis not present

## 2021-10-10 DIAGNOSIS — R918 Other nonspecific abnormal finding of lung field: Secondary | ICD-10-CM | POA: Diagnosis not present

## 2021-10-10 DIAGNOSIS — J860 Pyothorax with fistula: Secondary | ICD-10-CM | POA: Diagnosis not present

## 2021-10-10 DIAGNOSIS — J432 Centrilobular emphysema: Secondary | ICD-10-CM | POA: Diagnosis not present

## 2021-10-10 DIAGNOSIS — I700 Atherosclerosis of aorta: Secondary | ICD-10-CM | POA: Diagnosis not present

## 2021-10-10 DIAGNOSIS — J479 Bronchiectasis, uncomplicated: Secondary | ICD-10-CM | POA: Diagnosis not present

## 2021-10-10 HISTORY — PX: IR CATHETER TUBE CHANGE: IMG717

## 2021-10-10 LAB — CBC WITH DIFFERENTIAL/PLATELET
Abs Immature Granulocytes: 0.04 K/uL (ref 0.00–0.07)
Basophils Absolute: 0.1 K/uL (ref 0.0–0.1)
Basophils Relative: 1 %
Eosinophils Absolute: 0 K/uL (ref 0.0–0.5)
Eosinophils Relative: 0 %
HCT: 33.2 % — ABNORMAL LOW (ref 36.0–46.0)
Hemoglobin: 10.4 g/dL — ABNORMAL LOW (ref 12.0–15.0)
Immature Granulocytes: 0 %
Lymphocytes Relative: 9 %
Lymphs Abs: 1 K/uL (ref 0.7–4.0)
MCH: 30.8 pg (ref 26.0–34.0)
MCHC: 31.3 g/dL (ref 30.0–36.0)
MCV: 98.2 fL (ref 80.0–100.0)
Monocytes Absolute: 1 K/uL (ref 0.1–1.0)
Monocytes Relative: 9 %
Neutro Abs: 9 K/uL — ABNORMAL HIGH (ref 1.7–7.7)
Neutrophils Relative %: 81 %
Platelets: 497 K/uL — ABNORMAL HIGH (ref 150–400)
RBC: 3.38 MIL/uL — ABNORMAL LOW (ref 3.87–5.11)
RDW: 14.6 % (ref 11.5–15.5)
WBC: 11.1 K/uL — ABNORMAL HIGH (ref 4.0–10.5)
nRBC: 0 % (ref 0.0–0.2)

## 2021-10-10 LAB — COMPREHENSIVE METABOLIC PANEL
ALT: 13 U/L (ref 0–44)
AST: 16 U/L (ref 15–41)
Albumin: 2.5 g/dL — ABNORMAL LOW (ref 3.5–5.0)
Alkaline Phosphatase: 125 U/L (ref 38–126)
Anion gap: 8 (ref 5–15)
BUN: 17 mg/dL (ref 8–23)
CO2: 28 mmol/L (ref 22–32)
Calcium: 8.8 mg/dL — ABNORMAL LOW (ref 8.9–10.3)
Chloride: 97 mmol/L — ABNORMAL LOW (ref 98–111)
Creatinine, Ser: 0.56 mg/dL (ref 0.44–1.00)
GFR, Estimated: >60 mL/min (ref >60)
Glucose, Bld: 97 mg/dL (ref 70–99)
Potassium: 4.3 mmol/L (ref 3.5–5.1)
Sodium: 133 mmol/L — ABNORMAL LOW (ref 135–145)
Total Bilirubin: 0.3 mg/dL (ref 0.3–1.2)
Total Protein: 8.8 g/dL — ABNORMAL HIGH (ref 6.5–8.1)

## 2021-10-10 LAB — LACTIC ACID, PLASMA
Lactic Acid, Venous: 2.2 mmol/L (ref 0.5–1.9)
Lactic Acid, Venous: 1.9 mmol/L (ref 0.5–1.9)

## 2021-10-10 LAB — RESP PANEL BY RT-PCR (FLU A&B, COVID) ARPGX2
Influenza A by PCR: NEGATIVE
Influenza B by PCR: NEGATIVE
SARS Coronavirus 2 by RT PCR: POSITIVE — AB

## 2021-10-10 MED ORDER — LIDOCAINE HCL 1 % IJ SOLN
INTRAMUSCULAR | Status: AC
  Filled 2021-10-10: qty 20

## 2021-10-10 MED ORDER — SODIUM CHLORIDE 0.9 % IV BOLUS
1000.0000 mL | Freq: Once | INTRAVENOUS | Status: AC
  Administered 2021-10-10: 1000 mL via INTRAVENOUS

## 2021-10-10 MED ORDER — IOHEXOL 300 MG/ML  SOLN
75.0000 mL | Freq: Once | INTRAMUSCULAR | Status: AC | PRN
  Administered 2021-10-10: 75 mL via INTRAVENOUS

## 2021-10-10 NOTE — ED Triage Notes (Signed)
Pt to ED for complications with chest tube on right side, states its "backed out" last Wednesday, reports worsening shob. Reports tube is out further than it usually is.   Palliative care  States wears 2L [place] chronic. 69% on RA, place don 3 L 

## 2021-10-10 NOTE — Telephone Encounter (Signed)
Received a call from [facility], patient's sister-in-law.  She reports that patient is having more symptomatic complaints from chest drain with drainage, pain, weakness, shortness of breath.  Patient was pending CT scan this morning but given overall decline, I suggested the patient instead seek evaluation in ER.  Hopefully she can have imaging there and IR can assist with removal or replacement of drain as indicated.  I also readdressed the idea of hospice with [name].  She is in agreement and plans to speak with patient to encourage hospice involvement.  I called and gave report to ER triage nurse.

## 2021-10-10 NOTE — ED Provider Notes (Signed)
[facility] Provider Note    Event Date/Time   First MD Initiated Contact with Patient 10/10/21 1006     (approximate)   History   No chief complaint on file.   HPI  Claudia Chavez is a 67 y.o. female severe COPD on 2 L nasal cannula, history of non-small cell lung cancer status post right lower lobe resection, history of recurrent cavitary infection requiring drainage with permanent chest tube placement for chronic right lung abscess/empyema which was placed on 03/29/2020 who presents with concern for migration of the pigtail catheter.  Patient tells me that she had the pigtail catheter placed over a year ago by Dr. Faith Rogue with CT surgery who is now retired.  Several days ago she felt that it migrated and there has been increased drainage into her ostomy bag.  She does feel more short of breath.  She was seen on 1/6 in oncology clinic and I reviewed this visit where there was concern for migration of the tube and there is plan to get a CT of her chest today.  She presents to the ER because of fatigue and worsening of her dyspnea.  Of note patient was previously on hospice but this was revoked several months ago.     Past Medical History:  Diagnosis Date   Cancer of lower lobe of right lung ([place]) 05/07/2015   COPD (chronic obstructive pulmonary disease) (HCC)    Dyspnea    Non-small cell lung cancer ([place])    Pneumonia 04/2015   Pulmonary Mycobacterium avium complex (MAC) infection ([place]) 10/25/2018    Patient Active Problem List   Diagnosis Date Noted   Iron deficiency 07/29/2020   DVT of lower limb, acute ([place]) 04/03/2020   Chronic obstructive pulmonary disease (HCC)    DVT of lower extremity, bilateral ([place]) 04/02/2020   Protein-calorie malnutrition, severe 03/30/2020   Empyema ([place]) 03/26/2020   Pulmonary Mycobacterium avium complex (MAC) infection ([place]) 10/25/2018   Lung mass    Brain lesion 08/20/2018   History of stroke 08/20/2018   Malignant  neoplasm of right upper lobe of lung ([place]) 06/21/2018   Hypokalemia 06/21/2018   Orthostasis 06/11/2018   Chronic deep vein thrombosis (DVT) of distal vein of lower extremity ([place]) 06/11/2018   Symptomatic anemia 06/07/2018   Gastrointestinal hemorrhage 06/07/2018   CVA (cerebral vascular accident) ([place]) 05/24/2018   Syncope 05/24/2018   Lung nodule    Chronic bronchitis ([place]) 10/21/2015   Malignant neoplasm of lung ([place]) 05/07/2015   Bronchitis, chronic ([place]) 04/28/2015   Osteoporosis 04/28/2015   Overactive bladder 04/28/2015   Situational depression 04/28/2015   Carpal tunnel syndrome 07/13/2014   Cervical radiculitis 07/13/2014   Cervical spinal stenosis 07/13/2014   Carpal tunnel syndrome of left wrist 07/13/2014     Physical Exam  Triage Vital Signs: ED Triage Vitals  Enc Vitals Group     BP 10/10/21 1001 105/61     Pulse Rate 10/10/21 1001 (!) 119     Resp 10/10/21 1001 (!) 24     Temp 10/10/21 1001 99.7 F (37.6 C)     Temp Source 10/10/21 1001 Oral     SpO2 10/10/21 1001 (!) 69 %     Weight 10/10/21 1002 81 lb (36.7 kg)     Height 10/10/21 1002 _0  (1.549 m)     Head Circumference --      Peak Flow --      Pain Score 10/10/21 1002 0  Pain Loc --      Pain Edu? --      Excl. in [place]? --     Most recent vital signs: Vitals:   10/10/21 1147 10/10/21 1315  BP: 112/80 115/74  Pulse: 85 85  Resp: (!) 24 (!) 23  Temp: 98.2 F (36.8 C) 98.8 F (37.1 C)  SpO2: 96% 96%     General: Awake, no distress.  Appears chronically ill, cachectic CV:  Good peripheral perfusion.  Resp:  Normal effort.  Right pigtail in place with ostomy bag surrounding, there is milky fluid small-volume in the ostomy bag Abd:  No distention.  Neuro:             Awake, Alert, Oriented x 3  Other:     ED Results / Procedures / Treatments  Labs (all labs ordered are listed, but only abnormal results are displayed) Labs Reviewed  RESP PANEL BY RT-PCR (FLU A&B, COVID) ARPGX2 -  Abnormal; Notable for the following components:      Result Value   SARS Coronavirus 2 by RT PCR POSITIVE (*)    All other components within normal limits  COMPREHENSIVE METABOLIC PANEL - Abnormal; Notable for the following components:   Sodium 133 (*)    Chloride 97 (*)    Calcium 8.8 (*)    Total Protein 8.8 (*)    Albumin 2.5 (*)    All other components within normal limits  CBC WITH DIFFERENTIAL/PLATELET - Abnormal; Notable for the following components:   WBC 11.1 (*)    RBC 3.38 (*)    Hemoglobin 10.4 (*)    HCT 33.2 (*)    Platelets 497 (*)    Neutro Abs 9.0 (*)    All other components within normal limits  LACTIC ACID, PLASMA - Abnormal; Notable for the following components:   Lactic Acid, Venous 2.2 (*)    All other components within normal limits  CULTURE, BLOOD (ROUTINE X 2)  CULTURE, BLOOD (ROUTINE X 2)  LACTIC ACID, PLASMA     EKG  EKG interpreted by myself, sinus tachycardia, normal intervals, no acute ischemic changes although with significant artifact   RADIOLOGY X-ray reviewed by myself which shows pigtail tip within the chest but drainage holes outside, agree with radiology read   PROCEDURES:  Critical Care performed: No  .1-3 Lead EKG Interpretation Performed by: Rada Hay, MD Authorized by: Rada Hay, MD     Interpretation: abnormal     ECG rate assessment: tachycardic     Rhythm: sinus tachycardia     Ectopy: none     Conduction: normal    The patient is on the cardiac monitor to evaluate for evidence of arrhythmia and/or significant heart rate changes.   MEDICATIONS ORDERED IN ED: Medications  sodium chloride 0.9 % bolus 1,000 mL (0 mLs Intravenous Stopped 10/10/21 1349)  iohexol (OMNIPAQUE) 300 MG/ML solution 75 mL (75 mLs Intravenous Contrast Given 10/10/21 1209)  sodium chloride 0.9 % bolus 1,000 mL (1,000 mLs Intravenous New Bag/Given 10/10/21 1349)     IMPRESSION / MDM / ASSESSMENT AND PLAN / ED COURSE  I reviewed  the triage vital signs and the nursing notes.                              Differential diagnosis includes, but is not limited to, pneumothorax, worsening empyema, sepsis  Patient is a 66 year old female with a history of lung cancer and  chronic empyema with pigtail catheter in place for over a year presents with concern for migration of the catheter.  Patient with somewhat worsening dyspnea and generalized fatigue.  She is tachycardic but satting okay on her 2 L which is baseline for her.  She appears chronically ill but is not in acute distress.  The pigtail does look like it is migrated from the skin and there is some milky fluid draining to the ostomy bag that has a foul odor.  Discussed the case with PA Daryel November with medical oncology who notes that when he spoke to IR several days ago about the tube they had recommended a CT chest and likely just a pulled the tube.  I am concerned that with ongoing drainage and this potential air leak that pulling the tube could cause pneumothorax or recurrent empyema.  Also I am somewhat unclear on the patient's goals of care, was previously on hospice but is currently on palliative care.  When asked whether she would like labs and potentially antibiotics she says, do what ever he have to do."  In speaking with Dr. Marcelo Baldy does look like she will likely need hospice again.  Patient's labs are notable for mildly elevated lactate, she is notably COVID-positive which may explain her fatigue.  CT of the chest shows a bronchopleural fistula chronic pneumo and effusion with multiple other chronic findings not very much changed from her prior CT.  Discussed with interventional radiology who will exchange the pigtail.  We will give her 2 L of fluid and repeat the lactate.  We will hope to get her home as this is within her wishes and there is really not a good treatment option for her chronic infection.  Clinical Course as of 10/10/21 1351  Mon Oct 10, 2021  1205 SARS  Coronavirus 2 by RT PCR(!): POSITIVE [KM]  1205 Lactic Acid, Venous(!!): 2.2 [KM]    Clinical Course User Index [KM] Rada Hay, MD     FINAL CLINICAL IMPRESSION(S) / ED DIAGNOSES   Final diagnoses:  SOB (shortness of breath)  Bronchopleural fistula ([place])     Rx / DC Orders   ED Discharge Orders     None        Note:  This document was prepared using Dragon voice recognition software and may include unintentional dictation errors.   Rada Hay, MD 10/10/21 1351

## 2021-10-10 NOTE — ED Provider Notes (Signed)
Patient here with chronic bronchopleural fistula needing catheter replacement.  She is also incidentally COVID-positive but is essentially asymptomatic from this.  Case was discussed with hematology oncology as well as interventional radiology and the pigtail thoracostomy tube was replaced without complication.  Patient feels improved.  She is hemodynamically stable and satting well on room air.  She would like to continue management as an outpatient which I think is reasonable given the chronicity of her fistula and otherwise stable appearance.  I have sent a message to her oncologist to discuss further management of the thoracostomy tube, and she will call the office tomorrow.  Return precautions given.   Duffy Bruce, MD 10/10/21 1806

## 2021-10-10 NOTE — Procedures (Signed)
Vascular and Interventional Radiology Procedure Note  Patient: Claudia Chavez DOB: 1956-04-29 Medical Record Number: [id number] Note Date/Time: 10/10/21 4:26 PM   Performing Physician: Michaelle Birks, MD Assistant(s): None  Diagnosis: Pleurocutaneous fistula. Chronic indwelling R pigtail chest tube.  Procedure:  RIGHT PIGTAIL CHEST TUBE EXCHANGE  Anesthesia: Local Anesthetic Complications: None Estimated Blood Loss:  0 mL Specimens: Sent for Gram Stain, Aerobe Culture, Anerobe Culture, and Fungal  Findings:  Successful Fluoroscopy-guided exchange of a 14 F catheter into the right chest.  Plan:  - Air-occlusive dressing placed.  - Chest tube to -20 mmHg suction.  See detailed procedure note with images in PACS. The patient tolerated the procedure well without incident or complication and was returned to  ER  in stable condition.    Michaelle Birks, MD [facility] Radiology   Pager. [place]

## 2021-10-11 ENCOUNTER — Encounter: Payer: Self-pay | Admitting: Nurse Practitioner

## 2021-10-11 ENCOUNTER — Other Ambulatory Visit: Payer: PPO | Admitting: Nurse Practitioner

## 2021-10-11 ENCOUNTER — Other Ambulatory Visit: Payer: Self-pay | Admitting: Internal Medicine

## 2021-10-11 ENCOUNTER — Encounter: Payer: Self-pay | Admitting: Internal Medicine

## 2021-10-11 DIAGNOSIS — Z515 Encounter for palliative care: Secondary | ICD-10-CM

## 2021-10-11 DIAGNOSIS — R0602 Shortness of breath: Secondary | ICD-10-CM

## 2021-10-11 NOTE — Progress Notes (Signed)
[facility] Consult Note Telephone: [phone]  Fax: [phone]    Date of encounter: 10/11/21 3:20 PM PATIENT NAME: [place] [address]   [phone] (home)  DOB: [birth year] MRN: [id number] PRIMARY CARE PROVIDER:    Maryland Pink, MD,  [place] [address] [phone]  REFERRING PROVIDER:   Dr Rogue Bussing   RESPONSIBLE PARTY:    Contact Information     Name Relation Home Work Mobile   [place] Sister [phone]        Due to the COVID-19 crisis, this visit was done via telemedicine from my office and it was initiated and consent by this patient and or family.  I connected with  Georgianne Fick OR PROXY on 10/11/21 by a telephone as video not available enabled telemedicine application and verified that I am speaking with the correct person using two identifiers.   I discussed the limitations of evaluation and management by telemedicine. The patient expressed understanding and agreed to proceed. Palliative Care was asked to follow this patient by consultation request of  Maryland Pink, MD to address advance care planning and complex medical decision making. This is a follow up visit.                                  ASSESSMENT AND PLAN / RECOMMENDATIONS:  Symptom Management/Plan: Symptom Management/Plan: ACP: DNR in place, in vynca; discussed Hospice, Ms. Cowher endorses she will proceed with Hospice   2. Shortness of breath secondary to progressive of malignancy vs poorly controlled COPD; continue nebulizer treatments; albuterol HFA, on O2 supplemental; Budeson-Glycopyrrol-Formoterol. Discussed at length, Ms. Ellinger in agreement to Hospice, requested Hospice order, notified Hospice.   I spent 27 minutes providing this consultation. More than 50% of the time in this consultation was spent in counseling and care coordination.  PPS: 50%  HOSPICE  ELIGIBILITY/DIAGNOSIS: Yes per Hospice Physicians  Chief Complaint: Follow up palliative consult for complex medical decision making  HISTORY OF PRESENT ILLNESS:  JADINE BRUMLEY is a 66 y.o. year old female  with multiple medical problems including severe COPD, history of non-small cell right lung cancer status postchemotherapy and right lower lobe resection and adjuvant XRT. 10/10/2021 ED visit for chronic bronchopleural fistula needing catheter replacement.  She is also incidentally COVID-positive. Completed in IR and d/c home with pleurex. I called Ms. Tobin Chad for telemedicine visit telephonic as video not available. We talked about pleurex "box" she has attached to her side which she is not happy about. We talked about symptoms, worsening shortness of breath. We talked about fatigue, weakness. Ms. Spanbauer is covid positive. Ms. Ignasiak endorses she is unable to go to Dr Rogue Bussing appointment tomorrow, unable to come out of the house due to worsening condition. We talked about medical goals, options and in agreement to Hospice services. Notified Josh Borders, Dr Rogue Bussing, order sent. Therapeutic listening, emotional support provided.   History obtained from review of EMR, discussion with Ms. Houlton.  I reviewed available labs, medications, imaging, studies and related documents from the EMR.  Records reviewed and summarized above.   ROS 10 point reviewed all negative except HPI  Physical Exam: deferred Questions and concerns were addressed.  Provided general support and encouragement, no other unmet needs identified   Thank you for the opportunity to participate in the care of Ms. Debell.  The  palliative care team will continue to follow. Please call our office at [phone] if we can be of additional assistance.   Trejuan Matherne Ihor Gully, NP

## 2021-10-12 ENCOUNTER — Inpatient Hospital Stay: Payer: PPO

## 2021-10-12 ENCOUNTER — Encounter: Payer: Self-pay | Admitting: Internal Medicine

## 2021-10-12 ENCOUNTER — Inpatient Hospital Stay: Payer: PPO | Admitting: Internal Medicine

## 2021-10-13 ENCOUNTER — Other Ambulatory Visit
Admission: RE | Admit: 2021-10-13 | Discharge: 2021-10-13 | Disposition: A | Discharge status: Home / Self Care | Payer: PPO | Source: Ambulatory Visit | Attending: Family | Admitting: Family

## 2021-10-13 ENCOUNTER — Telehealth: Payer: Self-pay

## 2021-10-13 DIAGNOSIS — C3411 Malignant neoplasm of upper lobe, right bronchus or lung: Secondary | ICD-10-CM

## 2021-10-13 DIAGNOSIS — I5022 Chronic systolic (congestive) heart failure: Secondary | ICD-10-CM | POA: Insufficient documentation

## 2021-10-13 NOTE — Telephone Encounter (Signed)
Message received from Pete Glatter, NP:  please refer patient to [facility] for evaluation and management of her pleur-evac system at the request of hospice.  Referral order entered.

## 2021-10-15 LAB — CULTURE, BLOOD (ROUTINE X 2)
Culture: NO GROWTH
Culture: NO GROWTH
Special Requests: ADEQUATE

## 2021-10-18 LAB — AEROBIC/ANAEROBIC CULTURE W GRAM STAIN (SURGICAL/DEEP WOUND)

## 2021-10-20 ENCOUNTER — Inpatient Hospital Stay (HOSPITAL_BASED_OUTPATIENT_CLINIC_OR_DEPARTMENT_OTHER): Payer: PPO | Admitting: Hospice and Palliative Medicine

## 2021-10-20 ENCOUNTER — Other Ambulatory Visit: Payer: Self-pay

## 2021-10-20 DIAGNOSIS — C3411 Malignant neoplasm of upper lobe, right bronchus or lung: Secondary | ICD-10-CM

## 2021-10-20 DIAGNOSIS — Z515 Encounter for palliative care: Secondary | ICD-10-CM

## 2021-10-20 MED ORDER — TRAMADOL HCL 50 MG PO TABS: 50.0000 mg | ORAL_TABLET | Freq: Four times a day (QID) | ORAL | 0 refills | Status: AC | PRN

## 2021-10-20 NOTE — Progress Notes (Signed)
I spoke with patient by phone.  She reports doing reasonably well and is satisfied with hospice care at home.  She continues to verbalize her goal of comfort.  She denies changes in breathing or pain.  Chest pigtail catheter is draining into a Pleur-evac system.  Patient reports that the Pleur-evac is heavy and has a foul odor and she is hoping for a less obtrusive option.  Patient has an appointment next week to see cardiothoracic surgery.  Message sent to Dr. Roxan Hockey.   Patient request refill of tramadol.  Rx sent to pharmacy.

## 2021-10-27 ENCOUNTER — Other Ambulatory Visit: Payer: Self-pay

## 2021-10-27 ENCOUNTER — Institutional Professional Consult (permissible substitution) (INDEPENDENT_AMBULATORY_CARE_PROVIDER_SITE_OTHER): Admitting: Thoracic Surgery (Cardiothoracic Vascular Surgery)

## 2021-10-27 ENCOUNTER — Encounter: Payer: Self-pay | Admitting: Thoracic Surgery (Cardiothoracic Vascular Surgery)

## 2021-10-27 VITALS — BP 126/76 | HR 104 | Resp 20 | Ht 61.0 in | Wt 80.0 lb

## 2021-10-27 DIAGNOSIS — J860 Pyothorax with fistula: Secondary | ICD-10-CM | POA: Diagnosis not present

## 2021-10-27 DIAGNOSIS — C3411 Malignant neoplasm of upper lobe, right bronchus or lung: Secondary | ICD-10-CM | POA: Diagnosis not present

## 2021-10-27 NOTE — Progress Notes (Signed)
PCP is Maryland Pink, MD Referring Provider is Cammie Sickle, *  Chief Complaint  Patient presents with   Lung Cancer    Surgical consult, HX of  fluoroscopic-guided RIGHT pigtail pleural drainage catheter replacement 10/10/21 at [facility] ED, Chest CT 10/10/21    HPI: Claudia Chavez is sent for opinion on tube management for a chronic bronchopleural fistula.  Claudia Chavez is a 66 year old woman with a history of stage III lung cancer, pulmonary MAC, COPD, chronic dyspnea, pneumonia, chronic bronchopleural fistula with empyema, stroke, syncope, GI bleed, DVT, and severe protein calorie malnutrition.  She had a right lower lobectomy for non-small cell carcinoma of the lung in 2016.  In 2019 she developed a mycobacterial infection in her right lung that was treated with antimicrobial therapy but was unsuccessful.  She developed a large abscess in the right lung.  Dr. Genevive Bi placed a drain in the abscess in June 2021.  She has had a chronic indwelling tube since that time.  Recently her chest tube fell out.  A new pigtail catheter was placed through the same tract.  It was placed to a Pleur-evac.  She is unhappy with the Pleur-evac due to the size, weight, and foul odor.  Previously she had managed her tube with an ostomy bag.   Past Medical History:  Diagnosis Date   Cancer of lower lobe of right lung ([facility]) 05/07/2015   COPD (chronic obstructive pulmonary disease) ([facility])    Dyspnea    Non-small cell lung cancer ([facility])    Pneumonia 04/2015   Pulmonary Mycobacterium avium complex (MAC) infection ([facility]) 10/25/2018    Past Surgical History:  Procedure Laterality Date   ABDOMINAL HYSTERECTOMY     COLONOSCOPY WITH PROPOFOL N/A 06/09/2018   Procedure: COLONOSCOPY WITH PROPOFOL;  Surgeon: Toledo, Benay Pike, MD;  Location: [facility] ENDOSCOPY;  Service: Gastroenterology;  Laterality: N/A;   ELBOW SURGERY Right 1995   ELECTROMAGNETIC NAVIGATION BROCHOSCOPY N/A 10/25/2017   Procedure: ELECTROMAGNETIC  NAVIGATION BRONCHOSCOPY;  Surgeon: [name], MD;  Location: [facility] ORS;  Service: Cardiopulmonary;  Laterality: N/A;   ESOPHAGOGASTRODUODENOSCOPY N/A 06/08/2018   Procedure: ESOPHAGOGASTRODUODENOSCOPY (EGD);  Surgeon: Toledo, Benay Pike, MD;  Location: [facility] ENDOSCOPY;  Service: Gastroenterology;  Laterality: N/A;   FLEXIBLE BRONCHOSCOPY Right 10/10/2018   Procedure: FLEXIBLE BRONCHOSCOPY;  Surgeon: [name], MD;  Location: [facility] ORS;  Service: Cardiopulmonary;  Laterality: Right;   IR CATHETER TUBE CHANGE  10/10/2021   PORTACATH PLACEMENT Right 05/10/2015   Procedure: INSERTION PORT-A-CATH;  Surgeon: Nestor Lewandowsky, MD;  Location: [facility] ORS;  Service: General;  Laterality: Right;   VIDEO ASSISTED THORACOSCOPY (VATS)/THOROCOTOMY Right 08/18/2015   Procedure: VIDEO ASSISTED THORACOSCOPY (VATS)/THOROCOTOMY;  Surgeon: Nestor Lewandowsky, MD;  Location: [facility] ORS;  Service: General;  Laterality: Right;    Family History  Problem Relation Age of Onset   Stroke Mother    Lung cancer Father 66    Social History Social History   Tobacco Use   Smoking status: Former    Packs/day: 0.50    Years: 40.00    Pack years: 20.00    Types: Cigarettes    Quit date: 12/01/2019    Years since quitting: 1.9   Smokeless tobacco: Never  Vaping Use   Vaping Use: Never used  Substance Use Topics   Alcohol use: Not Currently    Alcohol/week: 2.0 - 4.0 standard drinks    Types: 2 - 4 Cans of beer per week    Comment: beer-occasionally   Drug use: No  Current Outpatient Medications  Medication Sig Dispense Refill   senna-docusate (SENNA S) 8.6-50 MG tablet Take 1 tablet by mouth at bedtime as needed for mild constipation. (Patient not taking: Reported on 09/08/2021) 45 tablet 4   albuterol (VENTOLIN HFA) 108 (90 Base) MCG/ACT inhaler Inhale 2 puffs into the lungs every 6 (six) hours as needed for wheezing or shortness of breath. 8 g 3   amoxicillin-clavulanate (AUGMENTIN) 875-125 MG tablet Take 1 tablet by mouth 2  (two) times daily. 20 tablet 0   benzonatate (TESSALON) 100 MG capsule TAKE 1 CAPSULE(100 MG) BY MOUTH THREE TIMES DAILY AS NEEDED FOR COUGH 90 capsule 3   Budeson-Glycopyrrol-Formoterol (BREZTRI AEROSPHERE) 160-9-4.8 MCG/ACT AERO Inhale 160 mcg into the lungs 2 (two) times daily. 5.9 g 11   Cholecalciferol (VITAMIN D3) 1000 units CAPS Take 2,000 Units by mouth daily.  (Patient not taking: Reported on 09/08/2021)     ELIQUIS 2.5 MG TABS tablet TAKE 1 TABLET(2.5 MG) BY MOUTH TWICE DAILY 60 tablet 2   ipratropium-albuterol (DUONEB) 0.5-2.5 (3) MG/3ML SOLN Take 3 mLs by nebulization every 4 (four) hours as needed. 360 mL 3   mirtazapine (REMERON) 30 MG tablet Take 1 tablet (30 mg total) by mouth at bedtime. 30 tablet 3   ondansetron (ZOFRAN) 4 MG tablet Take 1 tablet (4 mg total) by mouth every 8 (eight) hours as needed for nausea or vomiting. 45 tablet 3   Respiratory Therapy Supplies (FLUTTER) DEVI 1 each by Does not apply route daily. 1 each 0   traMADol (ULTRAM) 50 MG tablet Take 1-2 tablets (50-100 mg total) by mouth every 6 (six) hours as needed for moderate pain or severe pain. 60 tablet 0   vitamin B-12 (CYANOCOBALAMIN) 1000 MCG tablet Take 1,000 mcg by mouth daily. (Patient not taking: Reported on 09/08/2021)     No current facility-administered medications for this visit.   Facility-Administered Medications Ordered in Other Visits  Medication Dose Route Frequency Provider Last Rate Last Admin   sodium chloride flush (NS) 0.9 % injection 10 mL  10 mL Intravenous PRN Cammie Sickle, MD        Allergies  Allergen Reactions   Keppra [Levetiracetam] Other (See Comments)    Nausea and dizzy   Marinol [Dronabinol] Other (See Comments)    Dizziness; bad dreams   Lyrica [Pregabalin] Other (See Comments)    Made patient feel very dizzy and not feel good.     Review of Systems  Constitutional:  Positive for activity change, fatigue and unexpected weight change.  Respiratory:  Positive  for cough, shortness of breath and wheezing.   Neurological:  Positive for syncope.  Hematological:  Bruises/bleeds easily.   BP 126/76    Pulse (!) 104    Resp 20    Ht _0  (1.549 m)    Wt 80 lb (36.3 kg)    SpO2 92% Comment: RA   BMI 15.12 kg/m  Physical Exam Vitals reviewed.  Constitutional:      Appearance: She is ill-appearing.     Comments: Frail, cachectic  HENT:     Head: Normocephalic and atraumatic.  Eyes:     Extraocular Movements: Extraocular movements intact.  Cardiovascular:     Rate and Rhythm: Normal rate and regular rhythm.  Pulmonary:     Comments: Positive air leak on right Abdominal:     General: Abdomen is flat.  Musculoskeletal:     Cervical back: Neck supple.  Lymphadenopathy:     Cervical: No cervical  adenopathy.  Skin:    General: Skin is warm and dry.  Neurological:     General: No focal deficit present.     Mental Status: She is oriented to person, place, and time.     Cranial Nerves: No cranial nerve deficit.     Diagnostic Tests: CT CHEST WITH CONTRAST   TECHNIQUE: Multidetector CT imaging of the chest was performed during intravenous contrast administration.   CONTRAST:  43m OMNIPAQUE IOHEXOL 300 MG/ML  SOLN   COMPARISON:  03/21/2021 chest CT. Chest radiograph from earlier today.   FINDINGS: Cardiovascular: Normal heart size. No significant pericardial effusion/thickening. Right internal jugular Port-A-Cath terminates in the lower third of the SVC. Left anterior descending coronary atherosclerosis. Atherosclerotic nonaneurysmal thoracic aorta. Stable top-normal caliber main pulmonary artery (3.0 cm diameter). No central pulmonary emboli.   Mediastinum/Nodes: No discrete thyroid nodules. Unremarkable esophagus. No axillary adenopathy. Enlarged right paratracheal lymph nodes up to 1.2 cm short axis diameter (series 2/image 54), not appreciably changed since 03/21/2021 chest CT. Enlarged 1.3 cm right hilar node (series 2/image  69), not appreciably changed. Mildly enlarged right lower thoracic paraspinal lymph nodes up to 1.0 cm (series 2/image 120), not appreciably changed. No left hilar adenopathy.   Lungs/Pleura: Status post right lower lobectomy. Chronic large right bronchopleural fistula again demonstrated (series 2/image 64) with chronic moderate right pneumothorax with diffuse right pleural thickening and extensive patchy debris in the basilar right pleural space, not substantially changed since 03/21/2021. Right basilar pigtail drain terminates in the peripheral lower right pleural space, with interval retraction since 03/21/2021 with side holes noted in the subcutaneous soft tissues of the right chest wall (series 2/image 101). No left pneumothorax. No left pleural effusion. Severe centrilobular emphysema with mild diffuse bronchial wall thickening. Worsened chronic dense consolidation with some associated cavitation in the basilar right middle lobe with associated chronic volume loss and bronchiectasis. Extensive patchy tree-in-bud opacity and centrilobular nodularity throughout both lungs is worsened in the superior segment left lower lobe (for example with new indistinct 0.9 cm left lower lobe nodule (series 3/image 74) and otherwise stable since 03/29/2021 chest CT. Representative 2.0 cm lingular nodule (series 3/image 99), previously 2.1 cm, not appreciably changed.   Upper abdomen: No acute abnormality.   Musculoskeletal: No aggressive appearing focal osseous lesions. Mild thoracic spondylosis.   IMPRESSION: 1. Chronic large right bronchopleural fistula with chronic moderate right pneumothorax with diffuse right pleural thickening and extensive patchy debris in the basilar right pleural space, not substantially changed since 03/21/2021 chest CT. 2. Right basilar pigtail drain terminates in the peripheral lower right pleural space, with interval drain retraction since 03/21/2021 with side  holes noted in the subcutaneous soft tissues of the right chest wall. 3. Spectrum of findings most suggestive of interval progression of extensive chronic atypical mycobacterial infection (MAI), with worsened chronic diffuse patchy tree-in-bud opacity and centrilobular nodularity and worsened chronic cavitary consolidation at the right lung base. 4. Chronic mediastinal and right hilar lymphadenopathy is stable and more likely reactive. 5. One vessel coronary atherosclerosis. 6. Aortic Atherosclerosis (ICD10-I70.0) and Emphysema (ICD10-J43.9).   Electronically Signed: By: JIlona SorrelM.D. On: 10/10/2021 12:40 I personally reviewed the CT images.  There is a complex right chest with pleural thickening, debris, and space.  Impression: Claudia Chavez a 66year old woman with a complex medical history including non-small cell carcinoma of the lung, Mycobacterium avium complex infection of the lung, and numerous other medical problems.  She has a lung abscess/empyema with chronic bronchopleural fistula  that has been managed with tube drainage for the past 18 months.  Recently her tube fell out and she had a new tube placed at [facility].  It was placed to a Pleur-evac.  She is extremely unhappy with the device and wants to return to using an ostomy bag around the tube.  Given the chronicity of her problem, there is no reason to suspect that she would have any difficulty managing this tube the way she had managed the previous one.  I did offer to arrange to get a mini express drainage system which would be easier to manage than the full-size Pleur-evac that she has.  She refused that.  The suture that is present has almost completely pulled through.  I placed a new suture and then placed the ostomy bag over the tube a small slit was cut at the top to vent air.  She will return to managing the ostomy bag as she did previously.  Plan: She will call if she has any issues that I can be of assistance  with  Melrose Nakayama, MD [facility] [phone]

## 2021-10-31 DIAGNOSIS — R5381 Other malaise: Secondary | ICD-10-CM | POA: Diagnosis not present

## 2021-10-31 DIAGNOSIS — Z743 Need for continuous supervision: Secondary | ICD-10-CM | POA: Diagnosis not present

## 2021-10-31 DIAGNOSIS — R000 Tachycardia, unspecified: Secondary | ICD-10-CM | POA: Diagnosis not present

## 2021-11-08 LAB — FUNGUS CULTURE RESULT

## 2021-11-08 LAB — FUNGUS CULTURE WITH STAIN

## 2021-11-08 LAB — FUNGAL ORGANISM REFLEX

## 2021-11-09 ENCOUNTER — Ambulatory Visit: Payer: PPO | Admitting: Radiation Oncology

## 2021-11-30 DEATH — deceased
